# Patient Record
Sex: Male | Born: 1940 | Race: White | Hispanic: No | Marital: Married | Smoking: Former smoker
Health system: Southern US, Community
[De-identification: ages and names within clinical notes are randomized; demographics above are authoritative.]

## PROBLEM LIST (undated history)

## (undated) DIAGNOSIS — I639 Cerebral infarction, unspecified: Secondary | ICD-10-CM

## (undated) DIAGNOSIS — J4489 Other specified chronic obstructive pulmonary disease: Secondary | ICD-10-CM

## (undated) DIAGNOSIS — I4891 Unspecified atrial fibrillation: Secondary | ICD-10-CM

## (undated) DIAGNOSIS — I499 Cardiac arrhythmia, unspecified: Secondary | ICD-10-CM

## (undated) DIAGNOSIS — Z9889 Other specified postprocedural states: Secondary | ICD-10-CM

## (undated) DIAGNOSIS — Z974 Presence of external hearing-aid: Secondary | ICD-10-CM

## (undated) DIAGNOSIS — Z8673 Personal history of transient ischemic attack (TIA), and cerebral infarction without residual deficits: Secondary | ICD-10-CM

## (undated) DIAGNOSIS — Z8701 Personal history of pneumonia (recurrent): Secondary | ICD-10-CM

## (undated) DIAGNOSIS — T8859XA Other complications of anesthesia, initial encounter: Secondary | ICD-10-CM

## (undated) DIAGNOSIS — E042 Nontoxic multinodular goiter: Secondary | ICD-10-CM

## (undated) DIAGNOSIS — E785 Hyperlipidemia, unspecified: Secondary | ICD-10-CM

## (undated) DIAGNOSIS — J439 Emphysema, unspecified: Secondary | ICD-10-CM

## (undated) DIAGNOSIS — I712 Thoracic aortic aneurysm, without rupture, unspecified: Secondary | ICD-10-CM

## (undated) DIAGNOSIS — N281 Cyst of kidney, acquired: Secondary | ICD-10-CM

## (undated) DIAGNOSIS — I10 Essential (primary) hypertension: Secondary | ICD-10-CM

## (undated) DIAGNOSIS — M1990 Unspecified osteoarthritis, unspecified site: Secondary | ICD-10-CM

## (undated) DIAGNOSIS — R911 Solitary pulmonary nodule: Secondary | ICD-10-CM

## (undated) DIAGNOSIS — J9611 Chronic respiratory failure with hypoxia: Secondary | ICD-10-CM

## (undated) DIAGNOSIS — R350 Frequency of micturition: Secondary | ICD-10-CM

## (undated) DIAGNOSIS — T4145XA Adverse effect of unspecified anesthetic, initial encounter: Secondary | ICD-10-CM

## (undated) DIAGNOSIS — Z860100 Personal history of colon polyps, unspecified: Secondary | ICD-10-CM

## (undated) DIAGNOSIS — Z7901 Long term (current) use of anticoagulants: Secondary | ICD-10-CM

## (undated) DIAGNOSIS — J189 Pneumonia, unspecified organism: Secondary | ICD-10-CM

## (undated) DIAGNOSIS — F329 Major depressive disorder, single episode, unspecified: Secondary | ICD-10-CM

## (undated) DIAGNOSIS — N200 Calculus of kidney: Secondary | ICD-10-CM

## (undated) DIAGNOSIS — C678 Malignant neoplasm of overlapping sites of bladder: Secondary | ICD-10-CM

## (undated) DIAGNOSIS — F0390 Unspecified dementia without behavioral disturbance: Secondary | ICD-10-CM

## (undated) DIAGNOSIS — N401 Enlarged prostate with lower urinary tract symptoms: Secondary | ICD-10-CM

## (undated) DIAGNOSIS — R0600 Dyspnea, unspecified: Secondary | ICD-10-CM

## (undated) DIAGNOSIS — Z9981 Dependence on supplemental oxygen: Secondary | ICD-10-CM

## (undated) DIAGNOSIS — F32A Depression, unspecified: Secondary | ICD-10-CM

## (undated) DIAGNOSIS — C679 Malignant neoplasm of bladder, unspecified: Secondary | ICD-10-CM

## (undated) DIAGNOSIS — Z87442 Personal history of urinary calculi: Secondary | ICD-10-CM

## (undated) DIAGNOSIS — I480 Paroxysmal atrial fibrillation: Secondary | ICD-10-CM

## (undated) DIAGNOSIS — G43109 Migraine with aura, not intractable, without status migrainosus: Secondary | ICD-10-CM

## (undated) HISTORY — PX: TONSILLECTOMY: SUR1361

## (undated) HISTORY — PX: INGUINAL HERNIA REPAIR: SUR1180

## (undated) HISTORY — PX: SHOULDER ARTHROSCOPY: SHX128

## (undated) HISTORY — DX: Thoracic aortic aneurysm, without rupture, unspecified: I71.20

## (undated) HISTORY — DX: Thoracic aortic aneurysm, without rupture: I71.2

## (undated) HISTORY — PX: OTHER SURGICAL HISTORY: SHX169

## (undated) HISTORY — PX: POSTERIOR LUMBAR FUSION: SHX6036

---
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
## 2001-05-16 ENCOUNTER — Ambulatory Visit (HOSPITAL_COMMUNITY): Admission: RE | Admit: 2001-05-16 | Discharge: 2001-05-16 | Payer: Self-pay | Admitting: *Deleted

## 2001-05-16 ENCOUNTER — Encounter: Payer: Self-pay | Admitting: Internal Medicine

## 2001-08-15 ENCOUNTER — Encounter: Payer: Self-pay | Admitting: Family Medicine

## 2001-08-15 ENCOUNTER — Ambulatory Visit (HOSPITAL_COMMUNITY): Admission: RE | Admit: 2001-08-15 | Discharge: 2001-08-15 | Payer: Self-pay | Admitting: Family Medicine

## 2002-09-23 ENCOUNTER — Encounter: Payer: Self-pay | Admitting: Family Medicine

## 2002-09-23 ENCOUNTER — Ambulatory Visit (HOSPITAL_COMMUNITY): Admission: RE | Admit: 2002-09-23 | Discharge: 2002-09-23 | Payer: Self-pay | Admitting: Family Medicine

## 2002-10-23 ENCOUNTER — Ambulatory Visit (HOSPITAL_COMMUNITY): Admission: RE | Admit: 2002-10-23 | Discharge: 2002-10-23 | Payer: Self-pay | Admitting: General Surgery

## 2003-11-17 ENCOUNTER — Ambulatory Visit (HOSPITAL_COMMUNITY): Admission: RE | Admit: 2003-11-17 | Discharge: 2003-11-17 | Payer: Self-pay | Admitting: Family Medicine

## 2004-11-28 ENCOUNTER — Ambulatory Visit (HOSPITAL_COMMUNITY): Admission: RE | Admit: 2004-11-28 | Discharge: 2004-11-28 | Payer: Self-pay | Admitting: Family Medicine

## 2005-03-06 ENCOUNTER — Encounter (HOSPITAL_COMMUNITY): Admission: RE | Admit: 2005-03-06 | Discharge: 2005-04-05 | Payer: Self-pay | Admitting: Neurosurgery

## 2005-04-06 ENCOUNTER — Encounter (HOSPITAL_COMMUNITY): Admission: RE | Admit: 2005-04-06 | Discharge: 2005-05-06 | Payer: Self-pay | Admitting: Neurosurgery

## 2005-11-29 ENCOUNTER — Ambulatory Visit (HOSPITAL_COMMUNITY): Admission: RE | Admit: 2005-11-29 | Discharge: 2005-11-29 | Payer: Self-pay | Admitting: Family Medicine

## 2006-01-16 ENCOUNTER — Ambulatory Visit (HOSPITAL_COMMUNITY): Admission: RE | Admit: 2006-01-16 | Discharge: 2006-01-16 | Payer: Self-pay | Admitting: General Surgery

## 2006-01-16 ENCOUNTER — Encounter (INDEPENDENT_AMBULATORY_CARE_PROVIDER_SITE_OTHER): Payer: Self-pay | Admitting: General Surgery

## 2007-02-19 ENCOUNTER — Ambulatory Visit (HOSPITAL_COMMUNITY): Admission: RE | Admit: 2007-02-19 | Discharge: 2007-02-19 | Payer: Self-pay | Admitting: Family Medicine

## 2007-02-28 ENCOUNTER — Ambulatory Visit (HOSPITAL_COMMUNITY): Admission: RE | Admit: 2007-02-28 | Discharge: 2007-02-28 | Payer: Self-pay | Admitting: Family Medicine

## 2009-07-14 ENCOUNTER — Ambulatory Visit (HOSPITAL_BASED_OUTPATIENT_CLINIC_OR_DEPARTMENT_OTHER): Admission: RE | Admit: 2009-07-14 | Discharge: 2009-07-14 | Payer: Self-pay | Admitting: Orthopedic Surgery

## 2009-07-14 HISTORY — PX: OTHER SURGICAL HISTORY: SHX169

## 2010-07-18 ENCOUNTER — Ambulatory Visit (HOSPITAL_COMMUNITY): Admission: RE | Admit: 2010-07-18 | Discharge: 2010-07-18 | Payer: Self-pay | Admitting: Internal Medicine

## 2011-04-02 LAB — BASIC METABOLIC PANEL
BUN: 18 mg/dL (ref 6–23)
CO2: 31 mEq/L (ref 19–32)
Calcium: 8.8 mg/dL (ref 8.4–10.5)
Chloride: 106 mEq/L (ref 96–112)
Creatinine, Ser: 1.08 mg/dL (ref 0.4–1.5)
GFR calc Af Amer: >60 mL/min (ref >60)
GFR calc non Af Amer: >60 mL/min (ref >60)
Glucose, Bld: 94 mg/dL (ref 70–99)
Potassium: 3.9 mEq/L (ref 3.5–5.1)
Sodium: 141 mEq/L (ref 135–145)

## 2011-04-02 LAB — POCT HEMOGLOBIN-HEMACUE: Hemoglobin: 15.6 g/dL (ref 13.0–17.0)

## 2011-05-09 NOTE — Op Note (Signed)
NAMECAMAR, GUYTON               ACCOUNT NO.:  [id number]   MEDICAL RECORD NO.:  [id number]          PATIENT TYPE:  AMB   LOCATION:  DSC                          FACILITY:  [facility]   PHYSICIAN:  Cindee Salt, M.D.       DATE OF BIRTH:  11-14-1941   DATE OF PROCEDURE:  07/14/2009  DATE OF DISCHARGE:                               OPERATIVE REPORT   PREOPERATIVE DIAGNOSIS:  Cubital tunnel syndrome, left arm.   POSTOPERATIVE DIAGNOSIS:  Cubital tunnel syndrome, left arm.   OPERATION:  Decompression of ulnar nerve, left cubital tunnel.   SURGEON:  Cindee Salt, MD   ASSISTANT:  Carolyne Fiscal, RN   ANESTHESIA:  General.   ANESTHESIOLOGIST:  Janetta Hora. Frederick, MD   HISTORY:  The patient is a 70 year old male with a history of numbness  and tingling, ring and little fingers of his right hand.  Nerve  conductions are positive for cubital tunnel with changes in the motor  component.  He is advised to undergo surgical decompression.  Pre, peri,  postoperative course is discussed along with the risks and  complications.  He is aware that there is no guarantee with the surgery,  possibility of infection, recurrence of injury to arteries, nerves,  tendons, incomplete relief of symptoms, dystrophy.  In the preoperative  area, the patient is seen, extremity marked by both the patient and  surgeon, antibiotic is given.   PROCEDURE:  The patient was brought to the operating room where a  general anesthetic was carried out without difficulty under the  direction of Dr. Gelene Mink.  He was prepped using ChloraPrep, supine  position, left arm free.  A 3-minute dry time was allowed.  Time-out  taken confirming the patient and procedure.  He was then draped.  The  limb was exsanguinated with an Esmarch bandage, tourniquet placed high,  and the arm was inflated to 250 mmHg.  An incision was made over the  medial epicondyle, carried down through subcutaneous tissue.  The  posterior branches of medial  antebrachial cutaneous nerve of the arm  were identified.  These were protected.  Retractors were placed.  The  Osborne fascia was opened.  The ulnar nerve was identified.  Retractors  were placed proximally with loupe magnification with headlight  illumination.  A release was then performed.  Bleeders were  electrocauterized.  Release was performed up to the arcade of Struthers.  This was released.  No bleeding was noted.  The retractors were then  placed distally after dissection of the forearm fascia, the flexor carpi  ulnaris.  The fascia was released.  The muscle was then splint.  The  deep fascia was then released over the nerve after retractors were  placed under loupe magnification.  This was for approximately 7 cm in  each direction.  The arm was placed through full flexion.  No  subluxation of the nerve was noted.  The wound was copiously irrigated  with saline.  The skin was then closed with a subcuticular 5-0 Vicryl  suture.  A sterile compressive dressing and long-arm splint applied.  On  deflation of the tourniquet, all fingers immediately pinked.  An  infiltration of the area was done with 0.25% Marcaine without  epinephrine prior to closure.  The patient tolerated the procedure well  and was taken to the recovery room for observation in satisfactory  condition.  He will be discharged home to return to the [facility] in 1 week on Vicodin.           ______________________________  Cindee Salt, M.D.     GK/MEDQ  D:  07/14/2009  T:  07/15/2009  Job:  [id number]   cc:   Corrie Mckusick, M.D.

## 2011-05-12 NOTE — H&P (Signed)
NAMEABDUR, Kyle Owen               ACCOUNT NO.:  [id number]   MEDICAL RECORD NO.:  [id number]          PATIENT TYPE:  AMB   LOCATION:                                FACILITY:  [facility]   PHYSICIAN:  Dalia Heading, M.D.  DATE OF BIRTH:  1941-05-23   DATE OF ADMISSION:  01/16/2006  DATE OF DISCHARGE:  LH                                HISTORY & PHYSICAL   CHIEF COMPLAINT:  History of sigmoid colon polyp.   HISTORY OF PRESENT ILLNESS:  The patient is a 70 year old, white male who  presents for a followup colonoscopy.  He had sigmoid polypectomy during a  colonoscopy in 2003.  He now presents for a followup colonoscopy.  No cancer  was seen at the original polypectomy.  The patient denies any weight loss,  fever, abdominal pain, constipation, diarrhea, melena, hematochezia.  He  denies any family history of colon carcinoma.   PAST MEDICAL HISTORY:  Arthritis.   PAST SURGICAL HISTORY:  1.  Shoulder surgery.  2.  Cataract surgery.  3.  As in history of present illness.   CURRENT MEDICATIONS:  Ibuprofen p.r.n. arthritis pain.   ALLERGIES:  No known drug allergies.   REVIEW OF SYSTEMS:  The patient smokes a pack of cigarettes per day.  Denies  any alcohol use.  He denies any other cardiopulmonary difficulties or  bleeding disorders.   PHYSICAL EXAMINATION:  GENERAL:  The patient is a well-developed, well-  nourished, white male in no acute distress.  LUNGS:  Clear to auscultation with equal breath sounds bilaterally.  HEART:  Regular rate and rhythm without S3, S4 or murmurs.  ABDOMEN:  Soft, nontender, nondistended.  No hepatosplenomegaly or masses  are noted.  RECTAL:  Deferred to the procedure.   IMPRESSION:  History of sigmoid colon polyp.   PLAN:  The patient is scheduled for a colonoscopy on January 16, 2006.  The  risks and benefits of the procedure including bleeding and perforation were  fully explained to the patient gaining informed consent.      Dalia Heading,  M.D.  Electronically Signed     MAJ/MEDQ  D:  01/09/2006  T:  01/09/2006  Job:  [id number]   cc:   Corrie Mckusick, M.D.  Fax: [phone]

## 2011-05-12 NOTE — H&P (Signed)
   Kyle Owen, SIEVER                           ACCOUNT NO.:  [id number]   MEDICAL RECORD NO.:  [id number]                  PATIENT TYPE:   LOCATION:                                       FACILITY:   PHYSICIAN:  Dalia Heading, M.D.               DATE OF BIRTH:  [birth year]   DATE OF ADMISSION:  DATE OF DISCHARGE:                                HISTORY & PHYSICAL   CHIEF COMPLAINT:  Hematochezia.   HISTORY OF PRESENT ILLNESS:  The patient is a 70 year old white male who is  referred for a colonoscopy.  He has had intermittent episodes of  hematochezia for many years, possibly secondary to hemorrhoidal disease.  Denies any abdominal complaints.  He has never had a colonoscopy.  There is  no immediate family history of colon carcinoma.   PAST MEDICAL HISTORY:  Unremarkable.   PAST SURGICAL HISTORY:  Shoulder surgery, herniorrhaphy in the past.   CURRENT MEDICATIONS:  None.   ALLERGIES:  No known drug allergies.   REVIEW OF SYSTEMS:  The patient does smoke a pack of cigarettes a day.  He  denies any alcohol use.  He denies any other cardiopulmonary difficulties or  bleeding disorders.   PHYSICAL EXAMINATION:  GENERAL:  The patient is a well-developed, well-  nourished white male in no acute distress.  VITAL SIGNS:  He is afebrile and vital signs are stable.  LUNGS:  Clear to auscultation with equal breath sounds bilaterally.  HEART:  Regular rate and rhythm without S3, S4, or murmurs.  ABDOMEN:  Soft, nontender, nondistended.  No hepatosplenomegaly or masses  are noted.  RECTAL:  Deferred to the procedure.   IMPRESSION:  Hematochezia.    PLAN:  The patient is scheduled for a colonoscopy on October 21, 2002.  The  risks and benefits of the procedure including bleeding and perforation were  fully explained to the patient, gave informed consent.                                                 Dalia Heading, M.D.    MAJ/MEDQ  D:  10/02/2002  T:  10/02/2002  Job:   [id number]   cc:   Corrie Mckusick, M.D.  [address] [place] [place]  [place]  [place] [phone]  Fax: [phone]

## 2011-08-02 ENCOUNTER — Ambulatory Visit (HOSPITAL_COMMUNITY)
Admission: RE | Admit: 2011-08-02 | Discharge: 2011-08-02 | Disposition: A | Discharge status: Home / Self Care | Payer: Medicare Other | Source: Ambulatory Visit | Attending: Internal Medicine | Admitting: Internal Medicine

## 2011-08-02 ENCOUNTER — Other Ambulatory Visit (HOSPITAL_COMMUNITY): Payer: Self-pay | Admitting: Internal Medicine

## 2011-08-02 DIAGNOSIS — M25559 Pain in unspecified hip: Secondary | ICD-10-CM

## 2011-09-05 NOTE — H&P (Signed)
  NTS SOAP Note  Vital Signs:  Vitals as of: 09/05/2011: Systolic 144: Diastolic 80: Heart Rate 76: Temp 98.84F: Height 55ft 0in: Weight 191Lbs 0 Ounces: Pain Level 0: BMI 26  BMI : 25.9 kg/m2  Subjective: This 70 Years 49 Months old Male presents for of colonoscopy. Last TCS in 2007 showed benign sigmoid colon polyp. No GI complaints.  Review of Symptoms:  Constitutional: unremarkable  Head: unremarkable  Eyes: blurred vision left,diplopia Nose/Mouth/Throat:unremarkable  Cardiovascular:unremarkable  Respiratory: cough Gastrointestinal:unremarkable  Genitourinary: unremarkable  joint pain Skin: unremarkable  Hematolgic/Lymphatic: unremarkable  Allergic/Immunologic:unremarkable     Past Medical History:Reviewed  Past Medical History  Surgical History: shoulder, hernia, TCS Medical Problems: High Blood pressure Psychiatric History: Depression Allergies: nkda Medications: meloxicam, verapamil, zoloft   Social History: Reviewed   Social History  Preferred Language: English ([place]) Race: White Ethnicity: Not Hispanic / Latino Age: 70 Years 5 Months Marital Status: M Alcohol: No Recreational drug(s): No   Smoking Status: Current every day smoker reviewed on 09/05/2011 Started Date: 12/25/1958 Packs per day: 1.00   Family History: Reviewed  Family History  Sibling: Stroke   Objective Information:  General: Well appearing, well nourished in no distress.  Head: Atraumatic; no masses; no abnormalities  Neck: Supple without lymphadenopathy.  Heart: RRR, no murmur or gallop. Normal S1, S2. No S3, S4.  Lungs:CTA bilaterally, no wheezes, rhonchi, rales. Breathing unlabored.  Abdomen: Soft, NT/ND, no HSM, no masses.  deferred to procedure  Assessment: h/o colon polyp  Diagnosis & Procedure: DiagnosisCode: V12.72, ProcedureCode: 40981,   Orders:trilyte prescribed     Plan:Scheduled for TCS on 10/03/11.    Patient Education: Alternative  treatments to surgery were discussed with patient (and family).Risks and benefits of procedure were fully explained to the patient (and family) who gave informed consent. Patient/family questions were addressed.  Follow-up: Pending Surgery

## 2011-10-02 MED ORDER — SODIUM CHLORIDE 0.45 % IV SOLN
Freq: Once | INTRAVENOUS | Status: AC
  Administered 2011-10-03: 20 mL/h via INTRAVENOUS

## 2011-10-03 ENCOUNTER — Ambulatory Visit (HOSPITAL_COMMUNITY)
Admission: RE | Admit: 2011-10-03 | Discharge: 2011-10-03 | Disposition: A | Discharge status: Home / Self Care | Payer: Medicare Other | Source: Ambulatory Visit | Attending: General Surgery | Admitting: General Surgery

## 2011-10-03 ENCOUNTER — Encounter (HOSPITAL_COMMUNITY): Payer: Self-pay | Admitting: *Deleted

## 2011-10-03 ENCOUNTER — Encounter (HOSPITAL_COMMUNITY)
Admission: RE | Disposition: A | Discharge status: Home / Self Care | Payer: Self-pay | Source: Ambulatory Visit | Attending: General Surgery

## 2011-10-03 DIAGNOSIS — K648 Other hemorrhoids: Secondary | ICD-10-CM | POA: Insufficient documentation

## 2011-10-03 DIAGNOSIS — Z860100 Personal history of colon polyps, unspecified: Secondary | ICD-10-CM | POA: Insufficient documentation

## 2011-10-03 DIAGNOSIS — D126 Benign neoplasm of colon, unspecified: Secondary | ICD-10-CM | POA: Insufficient documentation

## 2011-10-03 DIAGNOSIS — Z79899 Other long term (current) drug therapy: Secondary | ICD-10-CM | POA: Insufficient documentation

## 2011-10-03 DIAGNOSIS — Z09 Encounter for follow-up examination after completed treatment for conditions other than malignant neoplasm: Secondary | ICD-10-CM | POA: Insufficient documentation

## 2011-10-03 DIAGNOSIS — I10 Essential (primary) hypertension: Secondary | ICD-10-CM | POA: Insufficient documentation

## 2011-10-03 DIAGNOSIS — K5730 Diverticulosis of large intestine without perforation or abscess without bleeding: Secondary | ICD-10-CM | POA: Insufficient documentation

## 2011-10-03 HISTORY — PX: COLONOSCOPY: SHX5424

## 2011-10-03 HISTORY — DX: Depression, unspecified: F32.A

## 2011-10-03 HISTORY — DX: Unspecified osteoarthritis, unspecified site: M19.90

## 2011-10-03 HISTORY — DX: Major depressive disorder, single episode, unspecified: F32.9

## 2011-10-03 SURGERY — COLONOSCOPY
Anesthesia: Moderate Sedation

## 2011-10-03 MED ORDER — STERILE WATER FOR IRRIGATION IR SOLN
Status: DC | PRN
  Administered 2011-10-03: 09:00:00

## 2011-10-03 MED ORDER — MEPERIDINE HCL 50 MG/ML IJ SOLN
INTRAMUSCULAR | Status: AC
  Filled 2011-10-03: qty 1

## 2011-10-03 MED ORDER — MEPERIDINE HCL 25 MG/ML IJ SOLN
INTRAMUSCULAR | Status: DC | PRN
  Administered 2011-10-03: 50 mg via INTRAVENOUS

## 2011-10-03 MED ORDER — MIDAZOLAM HCL 5 MG/5ML IJ SOLN
INTRAMUSCULAR | Status: AC
  Filled 2011-10-03: qty 5

## 2011-10-03 MED ORDER — MIDAZOLAM HCL 5 MG/5ML IJ SOLN
INTRAMUSCULAR | Status: DC | PRN
  Administered 2011-10-03: 1 mg via INTRAVENOUS
  Administered 2011-10-03: 3 mg via INTRAVENOUS

## 2011-10-03 NOTE — Interval H&P Note (Signed)
History and Physical Interval Note:   10/03/2011   8:45 AM   Camille Bal  has presented today for surgery, with the diagnosis of Colon polyps [211.3]  The various methods of treatment have been discussed with the patient and family. After consideration of risks, benefits and other options for treatment, the patient has consented to  Procedure(s): COLONOSCOPY as a surgical intervention .  I have reviewed the patients' chart and labs.  Questions were answered to the patient's satisfaction.     Dalia Heading  MD

## 2011-10-03 NOTE — OR Nursing (Signed)
Sigmoid polyp snared; not retrieved; Dr Lovell Sheehan aware.

## 2011-10-11 ENCOUNTER — Encounter (HOSPITAL_COMMUNITY): Payer: Self-pay | Admitting: General Surgery

## 2012-03-05 DIAGNOSIS — N400 Enlarged prostate without lower urinary tract symptoms: Secondary | ICD-10-CM | POA: Diagnosis not present

## 2012-03-05 DIAGNOSIS — F329 Major depressive disorder, single episode, unspecified: Secondary | ICD-10-CM | POA: Diagnosis not present

## 2012-04-29 DIAGNOSIS — F329 Major depressive disorder, single episode, unspecified: Secondary | ICD-10-CM | POA: Diagnosis not present

## 2012-04-29 DIAGNOSIS — M25559 Pain in unspecified hip: Secondary | ICD-10-CM | POA: Diagnosis not present

## 2012-04-30 DIAGNOSIS — M169 Osteoarthritis of hip, unspecified: Secondary | ICD-10-CM | POA: Diagnosis not present

## 2012-04-30 DIAGNOSIS — G5700 Lesion of sciatic nerve, unspecified lower limb: Secondary | ICD-10-CM | POA: Diagnosis not present

## 2012-05-08 ENCOUNTER — Ambulatory Visit (HOSPITAL_COMMUNITY)
Admission: RE | Admit: 2012-05-08 | Discharge: 2012-05-08 | Disposition: A | Discharge status: Home / Self Care | Payer: Medicare Other | Source: Ambulatory Visit | Attending: Orthopedic Surgery | Admitting: Orthopedic Surgery

## 2012-05-08 DIAGNOSIS — IMO0001 Reserved for inherently not codable concepts without codable children: Secondary | ICD-10-CM | POA: Insufficient documentation

## 2012-05-08 DIAGNOSIS — M6281 Muscle weakness (generalized): Secondary | ICD-10-CM | POA: Insufficient documentation

## 2012-05-08 DIAGNOSIS — M25559 Pain in unspecified hip: Secondary | ICD-10-CM | POA: Diagnosis not present

## 2012-05-08 DIAGNOSIS — M545 Low back pain, unspecified: Secondary | ICD-10-CM | POA: Insufficient documentation

## 2012-05-08 DIAGNOSIS — M25569 Pain in unspecified knee: Secondary | ICD-10-CM | POA: Insufficient documentation

## 2012-05-08 NOTE — Evaluation (Addendum)
Physical Therapy Evaluation  Patient Details  Name: Kyle Owen MRN: [id number] Date of Birth: 1941-10-25  Today's Date: 05/08/2012 Time: 1515-1604 PT Time Calculation (min): 49 min  Visit#: 1  of 6   Re-eval: 05/29/12 Assessment Diagnosis: piriformis  Next MD Visit: 06/23/2012 Prior Therapy: none  Authorization: medicare  Past Medical History:  Past Medical History  Diagnosis Date  . Hearing impairment   . Migraines     takes verapamil   . Colon polyps   . Arthritis   . Depression    Past Surgical History:  Past Surgical History  Procedure Date  . Hernia repair   . Shoulder arthroscopy     left  . Cataract extraction     Right  . Elbow arthroscopy     left  . Colonoscopy 10/03/2011    Procedure: COLONOSCOPY;  Surgeon: Dalia Heading;  [facility]: [facility];  Service: Gastroenterology;  Laterality: N/A;    Subjective Symptoms/Limitations Symptoms: Mr. Apostol states that he began having hip pain in his left leg for over four months.  He states he occasionally has pain going into his buttock.  He states that when he is twisting his leg as if getting out of the car or off a tractor  the pain is so severe that he almost falls.  This severe pain is only for a short time.  He states that the postion of comfort is to cross his left leg over his right.   He also recieves relief from puttting his arms on a fence post and allow his body to drop.lHe states that he has constant low back pain.  He takes pain meds in the morning but even with that by the afternoon he is bending forward.  He states his pain was so great that he was ready for surgery.   How long can you sit comfortably?: The patient states he is able to sit but he will cross his left leg over his right How long can you stand comfortably?: He is able to stand for two hours and then he needs to lie down How long can you walk comfortably?: The patient is limping all the time.  He is on so much pain medication that he is  able to keep walking. Special Tests: riding on lawn mower is very painful.  He needs to lie down following this activity. Pain Assessment Currently in Pain?: Yes Pain Score:   3 Pain Location: Back Pain Orientation: Left Pain Type: Chronic pain Pain Onset: More than a month ago Pain Frequency: Constant Pain Relieving Factors: traction; Multiple Pain Sites: Yes L hip current 4 worst 9/10 Prior Functioning  Prior Function Vocation: Retired Leisure: Hobbies-yes (Comment) Comments: gun smith, farming  Cognition/Observation Cognition Overall Cognitive Status: Appears within functional limits for tasks assessed   Assessment RLE Strength Right Hip Flexion: 5/5 Right Hip Extension: 3/5 Right Hip ABduction: 5/5 Right Hip ADduction: 5/5 Right Knee Flexion: 4/5 Right Knee Extension: 5/5 Right Ankle Dorsiflexion: 5/5 LLE Strength Left Hip Flexion: 5/5 Left Hip Extension: 3/5 Left Hip ABduction: 5/5 Left Hip ADduction: 5/5 Left Knee Flexion: 4/5 Left Knee Extension: 5/5 Lumbar AROM Lumbar Flexion: decreased 30% reps  Lumbar Extension: decreased 80% with reps no change Lumbar - Right Rotation: decreased 20% Lumbar - Left Rotation: decreased 20%  Exercise/Treatments Mobility/Balance  Posture/Postural Control Posture/Postural Control: Postural limitations Postural Limitations: forward head, rounded shoulder, flat back   Stretches Active Hamstring Stretch: 3 reps;30 seconds Knee: Self-Stretch to increase Flexion: 3  reps;30 seconds;Limitations Knee: Self-Stretch Limitations: knee to chest. Piriformis Stretch: 1 rep;30 seconds;Limitations Piriformis Stretch Limitations: All four   Modalities Modalities: Ultrasound Ultrasound Ultrasound Location: L gluteus medius  Ultrasound Parameters: 1.4 w/cm2 Ultrasound Goals: Pain  Physical Therapy Assessment and Plan PT Assessment and Plan Clinical Impression Statement: Pt with poor sitting posture, postural changes and chronic  low back pain who is not complaining of L hip pain who will benefit from skilled PT to decrease symptoms and improve the patients quality of life. Rehab Potential: Good PT Frequency: Min 2X/week PT Treatment/Interventions: Therapeutic activities;Therapeutic exercise;Other (comment) (modailities as needed to decrease pain ) PT Plan: Begin supine and sidelying clam ex next treatment. review stretches.  If pain is not significantly better next week may begin pelvic traction.    Goals Home Exercise Program Pt will Perform Home Exercise Program: Independently PT Short Term Goals Time to Complete Short Term Goals: 3 weeks PT Short Term Goal 1: Pain level in hip to be decreased by 4 PT Short Term Goal 2: Pt to state his is able to get in and out of his car and tractor without any increased pain.  Problem List There is no problem list on file for this patient.   PT - End of Session Activity Tolerance: Patient tolerated treatment well General Behavior During Session: [facility] for tasks performed Cognition: [facility] for tasks performed PT Plan of Care Consulted and Agree with Plan of Care: Patient  GP  Functional Reporting Modifier  Current Status  [phone] - Changing & Maintaing Body Position CL - At least 60% but less than 80% impaired, limited or restricted  Goal Status  [phone] - Changing & Maintaing Body Position CI - At least 1% but less than 20% impaired, limited or restricted  I chose changing position because  most pain is when he is getting out of a car or off his tractor.  I chose CL due to the fact the pt states he is ready to go to surgery to get rid of the pain. Minyon Billiter,CINDY 05/08/2012, 4:20 PM  Physician Documentation Your signature is required to indicate approval of the treatment plan as stated above.  Please sign and either send electronically or make a copy of this report for your files and return this physician signed original.   Please mark one 1.__approve of plan  2. ___approve of  plan with the following conditions.   ______________________________                                                          _____________________ Physician Signature                                                                                                             Date

## 2012-05-13 ENCOUNTER — Ambulatory Visit (HOSPITAL_COMMUNITY)
Admission: RE | Admit: 2012-05-13 | Discharge: 2012-05-13 | Disposition: A | Discharge status: Home / Self Care | Payer: Medicare Other | Source: Ambulatory Visit | Attending: Internal Medicine | Admitting: Internal Medicine

## 2012-05-13 NOTE — Progress Notes (Signed)
Physical Therapy Treatment Patient Details  Name: Kyle Owen MRN: [id number] Date of Birth: 06-Feb-1941  Today's Date: 05/13/2012 Time: 7829-5621 PT Time Calculation (min): 42 min Visit#: 2  of 6   Re-eval: 05/29/12 Charges: Therex x 30' Ultrasound x 8'   Subjective: Symptoms/Limitations Symptoms: After the ultrasound last session I realized I wasn't limiping anymore. My th time I got home I was limping again. Pain Assessment Currently in Pain?: Yes Pain Score:   3 Pain Location: Back (L hip) Pain Orientation: Left   Exercise/Treatments Stretches Active Hamstring Stretch: 3 reps;30 seconds Single Knee to Chest Stretch: 3 reps;30 seconds Supine Clam: 10 reps Sidelying Clam: 5 reps 10" holds  Quadruped Piriformis stretch 1 x30" B  Modalities Modalities: Ultrasound Ultrasound Ultrasound Location: L gluteus medius Ultrasound Parameters: 1.2 w/cm2 Ultrasound Goals: Pain  Physical Therapy Assessment and Plan PT Assessment and Plan Clinical Impression Statement: Reviewed stretches and completed clams in SL and supine. Pt completes all exercises well but does require multimodal cueing to complete piriformis stretch correctly. Ultrasound completed again this session secondary to positive results after last session. Pt presents with decreased antalgia at end of session. Pt reports pain decrease to 2/10 at end of session. PT Plan: Continue to progress per PT POC.     Problem List There is no problem list on file for this patient.   PT - End of Session Activity Tolerance: Patient tolerated treatment well General Behavior During Session: [facility] for tasks performed Cognition: [facility] for tasks performed   Seth Bake, PTA 05/13/2012, 1:58 PM

## 2012-05-14 ENCOUNTER — Ambulatory Visit (HOSPITAL_COMMUNITY): Payer: Medicare Other | Admitting: Physical Therapy

## 2012-05-16 ENCOUNTER — Ambulatory Visit (HOSPITAL_COMMUNITY)
Admission: RE | Admit: 2012-05-16 | Discharge: 2012-05-16 | Disposition: A | Discharge status: Home / Self Care | Payer: Medicare Other | Source: Ambulatory Visit | Attending: Internal Medicine | Admitting: Internal Medicine

## 2012-05-16 NOTE — Progress Notes (Signed)
Physical Therapy Treatment Patient Details  Name: Kyle Owen MRN: [id number] Date of Birth: 01-Feb-1941  Today's Date: 05/16/2012 Time: 1346-1430 PT Time Calculation (min): 44 min Visit#: 3  of 6   Re-eval: 05/29/12 Charges: Therex x 30' [place] x 8'  Subjective: Symptoms/Limitations Symptoms: Pt states he is still having trouble with quadruped piriformis stretch. Pain Assessment Currently in Pain?: Yes Pain Location: Back Pain Orientation: Left   Exercise/Treatments Stretches Passive Hamstring Stretch: 3 reps;30 seconds;Limitations Passive Hamstring Stretch Limitations: with rope Single Knee to Chest Stretch: 3 reps;30 seconds Piriformis Stretch: 3 reps;30 seconds;Limitations Piriformis Stretch Limitations: supine figure four Sidelying Clam: 5 reps;Limitations Clam Limitations: 10" holds Hip Abduction: 10 reps  Modalities Modalities: Ultrasound Ultrasound Ultrasound Location: L gluteus medius Ultrasound Parameters: 1 MHz 1.5 w/cm2 Ultrasound Goals: Pain  Physical Therapy Assessment and Plan PT Assessment and Plan Clinical Impression Statement: Pt ambulates into therapy with decrease antalgia. Modified piriformis stretch to supine figure four stretch as pt is unable to correctly complete stretch at home after instruction last two visits. Ultrasound continued this session to decrease pain. Pt reports pain decrease to 2/10 at end of session. PT Plan: Continue to progress per PT POC.     Problem List There is no problem list on file for this patient.   PT - End of Session Activity Tolerance: Patient tolerated treatment well General Behavior During Session: [facility] for tasks performed Cognition: [facility] for tasks performed    Seth Bake, PTA 05/16/2012, 5:43 PM

## 2012-05-21 ENCOUNTER — Ambulatory Visit (HOSPITAL_COMMUNITY)
Admission: RE | Admit: 2012-05-21 | Discharge: 2012-05-21 | Disposition: A | Discharge status: Home / Self Care | Payer: Medicare Other | Source: Ambulatory Visit | Attending: Internal Medicine | Admitting: Internal Medicine

## 2012-05-21 NOTE — Progress Notes (Signed)
Physical Therapy Treatment Patient Details  Name: Kyle Owen MRN: [id number] Date of Birth: December 01, 1941  Today's Date: 05/21/2012 Time: 1350 (started by PT (LM))-1432 Visit#: 5  of 6   Re-eval: 05/29/12 Authorization: medicare  Charges:  therex 10', therex 20', ultrasound 8' in sidelying  Subjective: Symptoms/Limitations Symptoms: Pt. states he always has pain.  2-3/10 usually, a little uncomfortable. Pain Assessment Currently in Pain?: Yes Pain Score:   3 Pain Location: Back Pain Orientation: Left Pain Type: Chronic pain   Exercise/Treatments Stretches Active Hamstring Stretch: 3 reps;30 seconds;Limitations Active Hamstring Stretch Limitations: with rope Single Knee to Chest Stretch: 3 reps;30 seconds Piriformis Stretch: 3 reps;30 seconds;Limitations Piriformis Stretch Limitations: supine figure four Sidelying Clam: 5 reps;Limitations Clam Limitations: 10" holds Hip Abduction: 10 reps Prone  Other Prone Lumbar Exercises: quad stretch 3X30"    Other Manual Therapy: Grade II PA mobs to L SI joint with STM to L piriformis to decrease pain in prone position performed by Annett Fabian, PT. Ultrasound Location: L gluteus medius Ultrasound Parameters: 1 MHz 1.5 w/cm2 Ultrasound Goals: Pain  Physical Therapy Assessment and Plan PT Assessment and Plan Clinical Impression Statement: joint mobs and STM performed by Annett Fabian, PT today to L SI while in prone position with good results.  Continued [place] treatment with Pt. reporting painfree at end of session.  Also added prone quad stretch to POC. PT Plan: Continue current POC; re-eval next visit/medicare G codes if applicable.    Lurena Nida, PTA/CLT 05/21/2012, 2:44 PM

## 2012-05-23 ENCOUNTER — Ambulatory Visit (HOSPITAL_COMMUNITY)
Admission: RE | Admit: 2012-05-23 | Discharge: 2012-05-23 | Disposition: A | Discharge status: Home / Self Care | Payer: Medicare Other | Source: Ambulatory Visit | Attending: Internal Medicine | Admitting: Internal Medicine

## 2012-05-23 NOTE — Progress Notes (Signed)
Physical Therapy Treatment Patient Details  Name: Kyle Owen MRN: [id number] Date of Birth: 1941-06-26  Today's Date: 05/23/2012 Time: 1306-1420 PT Time Calculation (min): 74 min  Visit#: 5  of 6   Re-eval: 05/29/12 Charges: Therex x 40' [place] x 8' Manual x 10'   Subjective: Symptoms/Limitations Symptoms: Pt states that his hip is not bothering him today but he is having some LBP. Pain Assessment Currently in Pain?: Yes Pain Score:   3 Pain Location: Back Pain Orientation: Lower   Exercise/Treatment Stretches Active Hamstring Stretch: 3 reps;30 seconds;Limitations Active Hamstring Stretch Limitations: with rope Single Knee to Chest Stretch: 3 reps;30 seconds Lower Trunk Rotation: 5 reps;10 seconds Piriformis Stretch: 3 reps;30 seconds;Limitations Piriformis Stretch Limitations: supine figure four with towel Supine Other Supine Lumbar Exercises: butterfly stretch x 1' Sidelying Clam: 10 reps Clam Limitations: 10" holds  Manual Therapy Other Manual Therapy: MET completed to correct L in-flare (TM x 5' after MET) Ultrasound Ultrasound Location: L gluteus medius Ultrasound Parameters: 1 MHz 1.5 w/cm2 Ultrasound Goals: Pain  Physical Therapy Assessment and Plan PT Assessment and Plan Clinical Impression Statement: Pt compeltes therex well. Pt complains of pain in L groins. Began butterfly stretch to improve adductor flexibility. Also began lower trunk rotation stretch to improve lumbar mobility and decrease pain. MET completed to correct L in-flare. Pt reports 0/10 at end of session. PT Plan: Reassess next session.     Problem List There is no problem list on file for this patient.   PT - End of Session Activity Tolerance: Patient tolerated treatment well General Behavior During Session: [facility] for tasks performed Cognition: [facility] for tasks performed  Seth Bake, PTA 05/23/2012, 2:31 PM

## 2012-05-27 ENCOUNTER — Ambulatory Visit (HOSPITAL_COMMUNITY)
Admission: RE | Admit: 2012-05-27 | Discharge: 2012-05-27 | Disposition: A | Discharge status: Home / Self Care | Payer: Medicare Other | Source: Ambulatory Visit | Attending: Internal Medicine | Admitting: Internal Medicine

## 2012-05-27 DIAGNOSIS — M25559 Pain in unspecified hip: Secondary | ICD-10-CM | POA: Insufficient documentation

## 2012-05-27 DIAGNOSIS — M6281 Muscle weakness (generalized): Secondary | ICD-10-CM | POA: Diagnosis not present

## 2012-05-27 DIAGNOSIS — M25569 Pain in unspecified knee: Secondary | ICD-10-CM | POA: Diagnosis not present

## 2012-05-27 DIAGNOSIS — M545 Low back pain, unspecified: Secondary | ICD-10-CM | POA: Diagnosis not present

## 2012-05-27 DIAGNOSIS — IMO0001 Reserved for inherently not codable concepts without codable children: Secondary | ICD-10-CM | POA: Insufficient documentation

## 2012-05-27 NOTE — Evaluation (Signed)
Physical Therapy Re-Evaluation/Treatment  Patient Details  Name: Kyle Owen MRN: [id number] Date of Birth: [birth year]  Today's Date: 05/27/2012 Time: 1302-1345 PT Time Calculation (min): 43 min Charges: 1 ROM, 15' TE, 20' Manual Visit#: 6  of 9   Re-eval: 06/19/12    Authorization: MEDICARE  Authorization Time Period: Positioning: Current: CJ; Goal: CI  Authorization Visit#: 6  of 16    Past Medical History:  Past Medical History  Diagnosis Date  . Hearing impairment   . Migraines     takes verapamil   . Colon polyps   . Arthritis   . Depression    Past Surgical History:  Past Surgical History  Procedure Date  . Hernia repair   . Shoulder arthroscopy     left  . Cataract extraction     Right  . Elbow arthroscopy     left  . Colonoscopy 10/03/2011    Procedure: COLONOSCOPY;  Surgeon: Dalia Heading;  Location: [facility];  Service: Gastroenterology;  Laterality: N/A;    Subjective Symptoms/Limitations Symptoms: Pt reports that he didn't have any pain from Thursday until Saturday morning.  he reports that he is going to be going to a shooting tournament for 5 days.  he states that he shot Saturday and it is the best he has felt in a few years.   Pain Assessment Currently in Pain?: Yes Pain Score:   2 (w/pain medication) Pain Location: Back (hip is more resoloved)  Assessment RLE Strength Right Hip Extension: 3+/5 (was 3/5) Right Knee Flexion: 5/5 (was 4/5) LLE Strength Left Hip Extension: 3+/5 (was 3/5) Left Knee Flexion: 5/5 (was 4/5) Lumbar AROM Lumbar Flexion: WNL (was decreased 30% reps ) Lumbar Extension: decreased 80% with reps pain rate 4/10 befor and after manual treatment (was decreased 80% with reps no change) Lumbar - Right Rotation: WNL (was decreased 20%) Lumbar - Left Rotation: WNL  (was decreased 20%) Palpation Palpation: increased TP to L groin region.  Decreased mobility L5-S1 SP and L3-4 L TP w/muscles spasms and fascial restrcitons on  the L lumbar paraspinar region.   Mobility/Balance  Ambulation/Gait Ambulation/Gait: Yes Gait Pattern: Decreased stance time - left;Decreased trunk rotation Stairs: Yes Posture/Postural Control Posture/Postural Control: Postural limitations Postural Limitations: improved posture   Exercise/Treatments Stretches Active Hamstring Stretch: 1 rep;30 seconds;Other (comment) (BLE) Single Knee to Chest Stretch: 1 rep;60 seconds;Other (comment) (BLE) Piriformis Stretch: 1 rep;60 seconds;Other (comment) (BLE) Piriformis Stretch Limitations: supine figure four with towel  Seated Other Seated Lumbar Exercises: Heel and Toe Roll in and outs 5x10' Supine Other Supine Lumbar Exercises: butterfly stretch x 1' Standing: Heel Walking 2 RT   Manual Therapy Manual Therapy: Joint mobilization Joint Mobilization: Grade I-IV to L5-S1 SP and L3-4 L TP w/stm after to decrease pain Other Manual Therapy: MET to correct L in-flare, w/L long leg distraction; SCS to L adductor to decrease pain.  Physical Therapy Assessment and Plan PT Assessment and Plan Clinical Impression Statement: G-code updated, re-eval complete. Mr. Frappier has attended 6 OP PT visits and has met his current goals.  He has expirenced a significant decrease in overall pain to his hip pain, however continues to have pin point pain over L L3-L4 TP.  Added exercises to address internal and external hip rotator muscle length. Pt reports decreased low back pain at end of session, except remains 4/10 w/lumbar extension Pt will benefit from skilled therapeutic intervention in order to improve on the following deficits: Pain;Decreased mobility;Increased muscle spasms;Increased fascial restricitons;Other (  comment) Rehab Potential: Good PT Duration:  (3 visits) PT Treatment/Interventions: Gait training;Functional mobility training;Stair training;Therapeutic exercise;Balance training;Neuromuscular re-education;Therapeutic activities;Patient/family  education;Other (comment) (manual techniques and modalities for pain control) PT Plan: Address pain to low back and hip after trap shooting tournament. Continue with manual techniques, stretching activities and gait training.     Goals Home Exercise Program Pt will Perform Home Exercise Program: Independently PT Goal: Perform Home Exercise Program - Progress: Met (at least 1x/day) PT Short Term Goals Time to Complete Short Term Goals: 3 weeks PT Short Term Goal 1: Pain level in hip to be decreased by 4 PT Short Term Goal 1 - Progress: Met PT Short Term Goal 2: Pt to state his is able to get in and out of his car and tractor without any increased pain. PT Short Term Goal 2 - Progress: Partly met ("it is getting better, decreased intensity." ) PT Short Term Goal 3: NEW GOAL 05/27/12: Pt will be able to tolerate 5 days of trap shooting with pain less than 3/10 in order to return to activities.  Problem List There is no problem list on file for this patient.   PT - End of Session Activity Tolerance: Patient tolerated treatment well General Behavior During Session: [facility] for tasks performed Cognition: [facility] for tasks performed PT Plan of Care PT Home Exercise Plan: see scanned report Consulted and Agree with Plan of Care: Patient  GP  Functional Reporting Modifier  Current Status  [phone] - Changing & Maintaing Body Position CJ - At least 20% but less than 40% impaired, limited or restricted  Goal Status  [phone] - Changing & Maintaing Body Position CI - At least 1% but less than 20% impaired, limited or restricted   Antrone Walla 05/27/2012, 2:06 PM  Physician Documentation Your signature is required to indicate approval of the treatment plan as stated above.  Please sign and either send electronically or make a copy of this report for your files and return this physician signed original.   Please mark one 1.__approve of plan  2. ___approve of plan with the following  conditions.   ______________________________                                                          _____________________ Physician Signature                                                                                                             Date

## 2012-05-30 ENCOUNTER — Ambulatory Visit (HOSPITAL_COMMUNITY): Payer: Medicare Other | Admitting: Physical Therapy

## 2012-06-03 ENCOUNTER — Ambulatory Visit (HOSPITAL_COMMUNITY)
Admission: RE | Admit: 2012-06-03 | Discharge: 2012-06-03 | Disposition: A | Discharge status: Home / Self Care | Payer: Medicare Other | Source: Ambulatory Visit | Attending: Internal Medicine | Admitting: Internal Medicine

## 2012-06-03 DIAGNOSIS — IMO0001 Reserved for inherently not codable concepts without codable children: Secondary | ICD-10-CM | POA: Diagnosis not present

## 2012-06-03 DIAGNOSIS — M25559 Pain in unspecified hip: Secondary | ICD-10-CM | POA: Diagnosis not present

## 2012-06-03 DIAGNOSIS — M25569 Pain in unspecified knee: Secondary | ICD-10-CM | POA: Diagnosis not present

## 2012-06-03 DIAGNOSIS — M545 Low back pain, unspecified: Secondary | ICD-10-CM | POA: Diagnosis not present

## 2012-06-03 DIAGNOSIS — M6281 Muscle weakness (generalized): Secondary | ICD-10-CM | POA: Diagnosis not present

## 2012-06-03 NOTE — Progress Notes (Signed)
Physical Therapy Treatment Patient Details  Name: Kyle Owen MRN: [id number] Date of Birth: Jan 18, 1941  Today's Date: 06/03/2012 Time: 0935-1015 PT Time Calculation (min): 40 min  Visit#: 7  of 9   Re-eval: 06/21/12  Charge: therex 23 Manual 16  Authorization: MEDICARE  Authorization Time Period: Positioning: Current: CJ; Goal: CI  Authorization Visit#: 7  of 16    Subjective: Symptoms/Limitations Symptoms: Kyle Owen three trophies at the shooting tournament this weekend, pain increases after sitting for 5-10 minutes but I stop limping after walking for 40 feet.  Pain scale 1 1/2 L LBP. Pain Assessment Currently in Pain?: Yes Pain Score:   2 Pain Location: Back Pain Orientation: Left;Lower  Objective:  Exercise/Treatments Stretches Active Hamstring Stretch: 2 reps;30 seconds;Limitations Active Hamstring Stretch Limitations: with rope Single Knee to Chest Stretch: 1 rep;60 seconds;Other (comment) (BLE) Piriformis Stretch: 1 rep;60 seconds;Other (comment) Piriformis Stretch Limitations: supine figure four with towel Supine Other Supine Lumbar Exercises: butterfly stretch x 1'   Manual Therapy Manual Therapy: Massage Massage: STM to L psoas and L piriformis Other Manual Therapy: SI within alignment today, no MET required  Physical Therapy Assessment and Plan PT Assessment and Plan Clinical Impression Statement: SI within alignment this session, no MET required. Pt with increased tenderness L adductor, completed STM to L adductor and psoas with relief stated pain scale 1/10. Therex session focus on stretches and manual for pain relief. Pt with improved gait mechanics noted at end of session. PT Plan: Address pain to low back and hip after trap shooting tournament.  Continue with manual techniques, stretching activities and gait training.    Goals    Problem List There is no problem list on file for this patient.   PT - End of Session Activity Tolerance: Patient  tolerated treatment well General Behavior During Session: [facility] for tasks performed Cognition: [facility] for tasks performed  GP No functional reporting required  Juel Burrow 06/03/2012, 5:04 PM

## 2012-06-05 ENCOUNTER — Ambulatory Visit (HOSPITAL_COMMUNITY)
Admission: RE | Admit: 2012-06-05 | Discharge: 2012-06-05 | Disposition: A | Discharge status: Home / Self Care | Payer: Medicare Other | Source: Ambulatory Visit | Attending: Internal Medicine | Admitting: Internal Medicine

## 2012-06-05 DIAGNOSIS — M25559 Pain in unspecified hip: Secondary | ICD-10-CM | POA: Diagnosis not present

## 2012-06-05 DIAGNOSIS — IMO0001 Reserved for inherently not codable concepts without codable children: Secondary | ICD-10-CM | POA: Diagnosis not present

## 2012-06-05 DIAGNOSIS — M6281 Muscle weakness (generalized): Secondary | ICD-10-CM | POA: Diagnosis not present

## 2012-06-05 DIAGNOSIS — M25569 Pain in unspecified knee: Secondary | ICD-10-CM | POA: Diagnosis not present

## 2012-06-05 DIAGNOSIS — M545 Low back pain, unspecified: Secondary | ICD-10-CM | POA: Diagnosis not present

## 2012-06-05 NOTE — Progress Notes (Addendum)
Physical Therapy Treatment Patient Details  Name: Kyle Owen MRN: [id number] Date of Birth: 1941-10-09  Today's Date: 06/05/2012 Time: 2536-6440 PT Time Calculation (min): 22 min  Visit#: 8  of 9   Re-eval: 06/21/12 Charges: Self care x 15'  Authorization: MEDICARE  Authorization Time Period: Positioning: Current: CJ; Goal: CI  Authorization Visit#: 8  of 16    Subjective: Symptoms/Limitations Symptoms: Pt states his pain is decreasing. Pain Assessment Currently in Pain?: Yes Pain Score:   2 Pain Location: Back Pain Orientation: Left   Exercise/Treatments Seated hip roll in/roll out 10x5"  Physical Therapy Assessment and Plan PT Assessment and Plan Clinical Impression Statement: SI in alignment this session. Pt reports very minimal pain this session. Pt states that his pain did not increase above 3/10 when he went trap shooting this weekend. Pt understands HEP and has reports no difficulty with the exercises. All goals have been met. PT Plan: Recommend to d/c per PT POC.    Goals Home Exercise Program Pt will Perform Home Exercise Program: Independently PT Short Term Goals Time to Complete Short Term Goals: 3 weeks PT Short Term Goal 1: Pain level in hip to be decreased by 4 PT Short Term Goal 1 - Progress: Met PT Short Term Goal 2: Pt to state his is able to get in and out of his car and tractor without any increased pain. PT Short Term Goal 2 - Progress: Met PT Short Term Goal 3: NEW GOAL 05/27/12: Pt will be able to tolerate 5 days of trap shooting with pain less than 3/10 in order to return to activities. PT Short Term Goal 3 - Progress: Met  Problem List There is no problem list on file for this patient.   PT - End of Session Activity Tolerance: Patient tolerated treatment well General Behavior During Session: [facility] for tasks performed Cognition: [facility] for tasks performed    Seth Bake, PTA 06/05/2012, 5:03 PM

## 2012-06-07 ENCOUNTER — Ambulatory Visit (HOSPITAL_COMMUNITY): Payer: Medicare Other | Admitting: *Deleted

## 2012-06-11 DIAGNOSIS — M169 Osteoarthritis of hip, unspecified: Secondary | ICD-10-CM | POA: Diagnosis not present

## 2012-06-11 DIAGNOSIS — G5700 Lesion of sciatic nerve, unspecified lower limb: Secondary | ICD-10-CM | POA: Diagnosis not present

## 2012-07-29 DIAGNOSIS — M1990 Unspecified osteoarthritis, unspecified site: Secondary | ICD-10-CM | POA: Diagnosis not present

## 2012-07-29 DIAGNOSIS — Z125 Encounter for screening for malignant neoplasm of prostate: Secondary | ICD-10-CM | POA: Diagnosis not present

## 2012-07-29 DIAGNOSIS — Z79899 Other long term (current) drug therapy: Secondary | ICD-10-CM | POA: Diagnosis not present

## 2012-07-29 DIAGNOSIS — G43009 Migraine without aura, not intractable, without status migrainosus: Secondary | ICD-10-CM | POA: Diagnosis not present

## 2012-08-02 DIAGNOSIS — N400 Enlarged prostate without lower urinary tract symptoms: Secondary | ICD-10-CM | POA: Insufficient documentation

## 2012-08-02 DIAGNOSIS — G43109 Migraine with aura, not intractable, without status migrainosus: Secondary | ICD-10-CM | POA: Insufficient documentation

## 2012-08-02 DIAGNOSIS — M1990 Unspecified osteoarthritis, unspecified site: Secondary | ICD-10-CM | POA: Insufficient documentation

## 2012-08-02 DIAGNOSIS — F32A Depression, unspecified: Secondary | ICD-10-CM | POA: Insufficient documentation

## 2012-08-02 DIAGNOSIS — F17200 Nicotine dependence, unspecified, uncomplicated: Secondary | ICD-10-CM | POA: Insufficient documentation

## 2012-08-02 DIAGNOSIS — K635 Polyp of colon: Secondary | ICD-10-CM | POA: Insufficient documentation

## 2012-08-02 DIAGNOSIS — F419 Anxiety disorder, unspecified: Secondary | ICD-10-CM | POA: Insufficient documentation

## 2012-08-02 DIAGNOSIS — D696 Thrombocytopenia, unspecified: Secondary | ICD-10-CM | POA: Insufficient documentation

## 2012-08-02 DIAGNOSIS — M1610 Unilateral primary osteoarthritis, unspecified hip: Secondary | ICD-10-CM | POA: Insufficient documentation

## 2012-08-05 DIAGNOSIS — D696 Thrombocytopenia, unspecified: Secondary | ICD-10-CM | POA: Diagnosis not present

## 2012-08-05 DIAGNOSIS — G43819 Other migraine, intractable, without status migrainosus: Secondary | ICD-10-CM | POA: Diagnosis not present

## 2012-08-05 DIAGNOSIS — M5020 Other cervical disc displacement, unspecified cervical region: Secondary | ICD-10-CM | POA: Diagnosis not present

## 2012-08-05 DIAGNOSIS — Z1212 Encounter for screening for malignant neoplasm of rectum: Secondary | ICD-10-CM | POA: Diagnosis not present

## 2012-08-07 DIAGNOSIS — Z961 Presence of intraocular lens: Secondary | ICD-10-CM | POA: Diagnosis not present

## 2012-08-07 DIAGNOSIS — H5210 Myopia, unspecified eye: Secondary | ICD-10-CM | POA: Diagnosis not present

## 2012-08-07 DIAGNOSIS — Z9849 Cataract extraction status, unspecified eye: Secondary | ICD-10-CM | POA: Diagnosis not present

## 2012-08-07 DIAGNOSIS — H2589 Other age-related cataract: Secondary | ICD-10-CM | POA: Diagnosis not present

## 2012-08-22 ENCOUNTER — Ambulatory Visit (INDEPENDENT_AMBULATORY_CARE_PROVIDER_SITE_OTHER): Payer: Medicare Other | Admitting: Otolaryngology

## 2012-08-22 DIAGNOSIS — J342 Deviated nasal septum: Secondary | ICD-10-CM

## 2012-08-22 DIAGNOSIS — J383 Other diseases of vocal cords: Secondary | ICD-10-CM

## 2012-08-22 DIAGNOSIS — K219 Gastro-esophageal reflux disease without esophagitis: Secondary | ICD-10-CM | POA: Diagnosis not present

## 2012-08-22 DIAGNOSIS — R490 Dysphonia: Secondary | ICD-10-CM

## 2012-09-24 ENCOUNTER — Other Ambulatory Visit (HOSPITAL_COMMUNITY): Payer: Self-pay | Admitting: Internal Medicine

## 2012-09-24 ENCOUNTER — Ambulatory Visit (HOSPITAL_COMMUNITY)
Admission: RE | Admit: 2012-09-24 | Discharge: 2012-09-24 | Disposition: A | Discharge status: Home / Self Care | Payer: Medicare Other | Source: Ambulatory Visit | Attending: Internal Medicine | Admitting: Internal Medicine

## 2012-09-24 DIAGNOSIS — R059 Cough, unspecified: Secondary | ICD-10-CM | POA: Insufficient documentation

## 2012-09-24 DIAGNOSIS — R05 Cough: Secondary | ICD-10-CM

## 2012-09-24 DIAGNOSIS — R918 Other nonspecific abnormal finding of lung field: Secondary | ICD-10-CM | POA: Diagnosis not present

## 2012-09-24 DIAGNOSIS — J209 Acute bronchitis, unspecified: Secondary | ICD-10-CM | POA: Diagnosis not present

## 2012-09-26 ENCOUNTER — Ambulatory Visit (INDEPENDENT_AMBULATORY_CARE_PROVIDER_SITE_OTHER): Payer: Medicare Other | Admitting: Otolaryngology

## 2012-09-26 DIAGNOSIS — K219 Gastro-esophageal reflux disease without esophagitis: Secondary | ICD-10-CM | POA: Diagnosis not present

## 2012-09-26 DIAGNOSIS — R490 Dysphonia: Secondary | ICD-10-CM

## 2012-09-26 DIAGNOSIS — J342 Deviated nasal septum: Secondary | ICD-10-CM | POA: Diagnosis not present

## 2012-09-30 DIAGNOSIS — H547 Unspecified visual loss: Secondary | ICD-10-CM | POA: Diagnosis not present

## 2012-09-30 DIAGNOSIS — H35379 Puckering of macula, unspecified eye: Secondary | ICD-10-CM | POA: Diagnosis not present

## 2012-09-30 DIAGNOSIS — Z961 Presence of intraocular lens: Secondary | ICD-10-CM | POA: Diagnosis not present

## 2012-09-30 DIAGNOSIS — H2589 Other age-related cataract: Secondary | ICD-10-CM | POA: Diagnosis not present

## 2012-10-03 ENCOUNTER — Encounter (HOSPITAL_COMMUNITY): Payer: Self-pay | Admitting: Pharmacy Technician

## 2012-10-11 NOTE — Patient Instructions (Addendum)
Your procedure is scheduled on: 10/21/2012  Report to [facility] at   615       AM.  Call this number if you have problems the morning of surgery: [phone]   Do not eat food or drink liquids :After Midnight.      Take these medicines the morning of surgery with A SIP OF WATER:lodine,prilosec,deltazone,zoloft,calan   Do not wear jewelry, make-up or nail polish.  Do not wear lotions, powders, or perfumes. You may wear deodorant.  Do not shave 48 hours prior to surgery.  Do not bring valuables to the hospital.  Contacts, dentures or bridgework may not be worn into surgery.  Leave suitcase in the car. After surgery it may be brought to your room.  For patients admitted to the hospital, checkout time is 11:00 AM the day of discharge.   Patients discharged the day of surgery will not be allowed to drive home.  :     Please read over the following fact sheets that you were given: Coughing and Deep Breathing, Surgical Site Infection Prevention, Anesthesia Post-op Instructions and Care and Recovery After Surgery    Cataract A cataract is a clouding of the lens of the eye. When a lens becomes cloudy, vision is reduced based on the degree and nature of the clouding. Many cataracts reduce vision to some degree. Some cataracts make people more near-sighted as they develop. Other cataracts increase glare. Cataracts that are ignored and become worse can sometimes look white. The white color can be seen through the pupil. CAUSES   Aging. However, cataracts may occur at any age, even in newborns.   Certain drugs.   Trauma to the eye.   Certain diseases such as diabetes.   Specific eye diseases such as chronic inflammation inside the eye or a sudden attack of a rare form of glaucoma.   Inherited or acquired medical problems.  SYMPTOMS   Gradual, progressive drop in vision in the affected eye.   Severe, rapid visual loss. This most often happens when trauma is the cause.  DIAGNOSIS  To  detect a cataract, an eye doctor examines the lens. Cataracts are best diagnosed with an exam of the eyes with the pupils enlarged (dilated) by drops.  TREATMENT  For an early cataract, vision may improve by using different eyeglasses or stronger lighting. If that does not help your vision, surgery is the only effective treatment. A cataract needs to be surgically removed when vision loss interferes with your everyday activities, such as driving, reading, or watching TV. A cataract may also have to be removed if it prevents examination or treatment of another eye problem. Surgery removes the cloudy lens and usually replaces it with a substitute lens (intraocular lens, IOL).  At a time when both you and your doctor agree, the cataract will be surgically removed. If you have cataracts in both eyes, only one is usually removed at a time. This allows the operated eye to heal and be out of danger from any possible problems after surgery (such as infection or poor wound healing). In rare cases, a cataract may be doing damage to your eye. In these cases, your caregiver may advise surgical removal right away. The vast majority of people who have cataract surgery have better vision afterward. HOME CARE INSTRUCTIONS  If you are not planning surgery, you may be asked to do the following:  Use different eyeglasses.   Use stronger or brighter lighting.   Ask your eye doctor about  reducing your medicine dose or changing medicines if it is thought that a medicine caused your cataract. Changing medicines does not make the cataract go away on its own.   Become familiar with your surroundings. Poor vision can lead to injury. Avoid bumping into things on the affected side. You are at a higher risk for tripping or falling.   Exercise extreme care when driving or operating machinery.   Wear sunglasses if you are sensitive to bright light or experiencing problems with glare.  SEEK IMMEDIATE MEDICAL CARE IF:   You have  a worsening or sudden vision loss.   You notice redness, swelling, or increasing pain in the eye.   You have a fever.  Document Released: 12/11/2005 Document Revised: 11/30/2011 Document Reviewed: 08/04/2011 [facility] Patient Information 2012 [place], [place].PATIENT INSTRUCTIONS POST-ANESTHESIA  IMMEDIATELY FOLLOWING SURGERY:  Do not drive or operate machinery for the first twenty four hours after surgery.  Do not make any important decisions for twenty four hours after surgery or while taking narcotic pain medications or sedatives.  If you develop intractable nausea and vomiting or a severe headache please notify your doctor immediately.  FOLLOW-UP:  Please make an appointment with your surgeon as instructed. You do not need to follow up with anesthesia unless specifically instructed to do so.  WOUND CARE INSTRUCTIONS (if applicable):  Keep a dry clean dressing on the anesthesia/puncture wound site if there is drainage.  Once the wound has quit draining you may leave it open to air.  Generally you should leave the bandage intact for twenty four hours unless there is drainage.  If the epidural site drains for more than 36-48 hours please call the anesthesia department.  QUESTIONS?:  Please feel free to call your physician or the hospital operator if you have any questions, and they will be happy to assist you.

## 2012-10-14 ENCOUNTER — Ambulatory Visit (HOSPITAL_COMMUNITY)
Admission: RE | Admit: 2012-10-14 | Discharge: 2012-10-14 | Disposition: A | Discharge status: Home / Self Care | Payer: Medicare Other | Source: Ambulatory Visit | Attending: Internal Medicine | Admitting: Internal Medicine

## 2012-10-14 ENCOUNTER — Encounter (HOSPITAL_COMMUNITY)
Admission: RE | Admit: 2012-10-14 | Discharge: 2012-10-14 | Disposition: A | Discharge status: Home / Self Care | Payer: Medicare Other | Source: Ambulatory Visit | Attending: Ophthalmology | Admitting: Ophthalmology

## 2012-10-14 DIAGNOSIS — H2510 Age-related nuclear cataract, unspecified eye: Secondary | ICD-10-CM | POA: Diagnosis not present

## 2012-10-14 DIAGNOSIS — Z01810 Encounter for preprocedural cardiovascular examination: Secondary | ICD-10-CM | POA: Diagnosis not present

## 2012-10-14 DIAGNOSIS — Z01812 Encounter for preprocedural laboratory examination: Secondary | ICD-10-CM | POA: Diagnosis not present

## 2012-10-14 DIAGNOSIS — Z5309 Procedure and treatment not carried out because of other contraindication: Secondary | ICD-10-CM | POA: Insufficient documentation

## 2012-10-14 DIAGNOSIS — J449 Chronic obstructive pulmonary disease, unspecified: Secondary | ICD-10-CM | POA: Diagnosis not present

## 2012-10-14 DIAGNOSIS — J189 Pneumonia, unspecified organism: Secondary | ICD-10-CM | POA: Diagnosis not present

## 2012-10-14 NOTE — Pre-Procedure Instructions (Signed)
Patient called to let him know that surgery is still on for 10/21/2012. He will be here 10/17/2012 at 1100 for PAT. Or scheduler,Dr Hunt's office and Dr Alonza Smoker office notified.

## 2012-10-14 NOTE — Pre-Procedure Instructions (Signed)
Patient in for PAT. o2 sat 81. Sat probe moved from finger to finger,patient instructed to take deep breaths and sat finally up to 88% but no more. Patient told states  he had just finished prednisone and antibiotics for pneumonia per Dr Ouida Sills but had not had a followup chest xray. Dr Ouida Sills notified and chest xray ordered stat. Will defer preop visit until chest xray results are back. Will call patient once results are back to complete this PAT.

## 2012-10-14 NOTE — Pre-Procedure Instructions (Signed)
Dr Jayme Cloud aware of repeat chest xray report and says ok with him to procede with surery.

## 2012-10-14 NOTE — Pre-Procedure Instructions (Signed)
Chest xray report called to Dr Alonza Smoker office and suggests followup again-infiltrate not completely resolved. Dr Ouida Sills now out of office until Wednesday 10/16/2012. Jewel, Dr Alonza Smoker office nurse will leave this on his desk to evaluate and let us know about surgery procedeing or being canceled.

## 2012-10-17 ENCOUNTER — Encounter (HOSPITAL_COMMUNITY)
Admission: RE | Admit: 2012-10-17 | Discharge: 2012-10-17 | Disposition: A | Discharge status: Home / Self Care | Payer: Medicare Other | Source: Ambulatory Visit | Attending: Ophthalmology | Admitting: Ophthalmology

## 2012-10-17 ENCOUNTER — Encounter (HOSPITAL_COMMUNITY): Payer: Self-pay

## 2012-10-17 DIAGNOSIS — Z01810 Encounter for preprocedural cardiovascular examination: Secondary | ICD-10-CM | POA: Diagnosis not present

## 2012-10-17 DIAGNOSIS — Z5309 Procedure and treatment not carried out because of other contraindication: Secondary | ICD-10-CM | POA: Diagnosis not present

## 2012-10-17 DIAGNOSIS — H2510 Age-related nuclear cataract, unspecified eye: Secondary | ICD-10-CM | POA: Diagnosis not present

## 2012-10-17 DIAGNOSIS — Z01812 Encounter for preprocedural laboratory examination: Secondary | ICD-10-CM | POA: Diagnosis not present

## 2012-10-17 LAB — BASIC METABOLIC PANEL
BUN: 22 mg/dL (ref 6–23)
CO2: 31 mEq/L (ref 19–32)
Calcium: 9 mg/dL (ref 8.4–10.5)
Chloride: 101 mEq/L (ref 96–112)
Creatinine, Ser: 1.2 mg/dL (ref 0.50–1.35)
GFR calc Af Amer: 68 mL/min — ABNORMAL LOW (ref >90)
GFR calc non Af Amer: 59 mL/min — ABNORMAL LOW (ref >90)
Glucose, Bld: 91 mg/dL (ref 70–99)
Potassium: 4.4 mEq/L (ref 3.5–5.1)
Sodium: 140 mEq/L (ref 135–145)

## 2012-10-17 LAB — HEMOGLOBIN AND HEMATOCRIT, BLOOD
HCT: 37.6 % — ABNORMAL LOW (ref 39.0–52.0)
Hemoglobin: 12 g/dL — ABNORMAL LOW (ref 13.0–17.0)

## 2012-10-18 MED ORDER — LIDOCAINE HCL 3.5 % OP GEL
OPHTHALMIC | Status: AC
  Filled 2012-10-18: qty 5

## 2012-10-18 MED ORDER — TETRACAINE HCL 0.5 % OP SOLN
OPHTHALMIC | Status: AC
  Filled 2012-10-18: qty 2

## 2012-10-18 MED ORDER — LIDOCAINE HCL (PF) 1 % IJ SOLN
INTRAMUSCULAR | Status: AC
  Filled 2012-10-18: qty 2

## 2012-10-18 MED ORDER — PHENYLEPHRINE HCL 2.5 % OP SOLN
OPHTHALMIC | Status: AC
  Filled 2012-10-18: qty 2

## 2012-10-18 MED ORDER — CYCLOPENTOLATE-PHENYLEPHRINE 0.2-1 % OP SOLN
OPHTHALMIC | Status: AC
  Filled 2012-10-18: qty 2

## 2012-10-18 MED ORDER — NEOMYCIN-POLYMYXIN-DEXAMETH 3.5-10000-0.1 OP OINT
TOPICAL_OINTMENT | OPHTHALMIC | Status: AC
  Filled 2012-10-18: qty 3.5

## 2012-10-21 ENCOUNTER — Ambulatory Visit (HOSPITAL_COMMUNITY): Admission: RE | Admit: 2012-10-21 | Payer: Medicare Other | Source: Ambulatory Visit | Admitting: Ophthalmology

## 2012-10-21 SURGERY — PHACOEMULSIFICATION, CATARACT, WITH IOL INSERTION
Anesthesia: Monitor Anesthesia Care | Site: Eye | Laterality: Left

## 2012-10-30 ENCOUNTER — Other Ambulatory Visit (HOSPITAL_COMMUNITY): Payer: Self-pay | Admitting: Internal Medicine

## 2012-10-30 ENCOUNTER — Ambulatory Visit (HOSPITAL_COMMUNITY)
Admission: RE | Admit: 2012-10-30 | Discharge: 2012-10-30 | Disposition: A | Discharge status: Home / Self Care | Payer: Medicare Other | Source: Ambulatory Visit | Attending: Internal Medicine | Admitting: Internal Medicine

## 2012-10-30 DIAGNOSIS — J189 Pneumonia, unspecified organism: Secondary | ICD-10-CM | POA: Diagnosis not present

## 2012-11-20 DIAGNOSIS — J189 Pneumonia, unspecified organism: Secondary | ICD-10-CM | POA: Diagnosis not present

## 2012-11-20 DIAGNOSIS — Z23 Encounter for immunization: Secondary | ICD-10-CM | POA: Diagnosis not present

## 2012-12-09 ENCOUNTER — Encounter (HOSPITAL_COMMUNITY): Payer: Self-pay | Admitting: Pharmacy Technician

## 2012-12-09 DIAGNOSIS — H2589 Other age-related cataract: Secondary | ICD-10-CM | POA: Diagnosis not present

## 2012-12-09 DIAGNOSIS — Z961 Presence of intraocular lens: Secondary | ICD-10-CM | POA: Diagnosis not present

## 2012-12-09 DIAGNOSIS — H52229 Regular astigmatism, unspecified eye: Secondary | ICD-10-CM | POA: Diagnosis not present

## 2012-12-09 DIAGNOSIS — H5200 Hypermetropia, unspecified eye: Secondary | ICD-10-CM | POA: Diagnosis not present

## 2012-12-10 NOTE — Patient Instructions (Signed)
Your procedure is scheduled on:  12/16/12  Report to [facility] at 07:00 AM.  Call this number if you have problems the morning of surgery: [phone]   Remember:   Do not eat or drink:After Midnight.  Take these medicines the morning of surgery with A SIP OF WATER: Omeprazole and Verapamil. Use your Ventolin Inhaler also.   Do not wear jewelry, make-up or nail polish.  Do not wear lotions, powders, or perfumes. You may wear deodorant.  Do not shave 48 hours prior to surgery. Men may shave face and neck.  Do not bring valuables to the hospital.  Contacts, dentures or bridgework may not be worn into surgery.  Leave suitcase in the car. After surgery it may be brought to your room.  For patients admitted to the hospital, checkout time is 11:00 AM the day of discharge.   Patients discharged the day of surgery will not be allowed to drive home.    Special Instructions: Start using your eye drops before surgery as directed by your eye doctor.   Please read over the following fact sheets that you were given: Anesthesia Post-op Instructions    Cataract Surgery  A cataract is a clouding of the lens of the eye. When a lens becomes cloudy, vision is reduced based on the degree and nature of the clouding. Surgery may be needed to improve vision. Surgery removes the cloudy lens and usually replaces it with a substitute lens (intraocular lens, IOL). LET YOUR EYE DOCTOR KNOW ABOUT:  Allergies to food or medicine.  Medicines taken including herbs, eyedrops, over-the-counter medicines, and creams.  Use of steroids (by mouth or creams).  Previous problems with anesthetics or numbing medicine.  History of bleeding problems or blood clots.  Previous surgery.  Other health problems, including diabetes and kidney problems.  Possibility of pregnancy, if this applies. RISKS AND COMPLICATIONS  Infection.  Inflammation of the eyeball (endophthalmitis) that can spread to both eyes (sympathetic  ophthalmia).  Poor wound healing.  If an IOL is inserted, it can later fall out of proper position. This is very uncommon.  Clouding of the part of your eye that holds an IOL in place. This is called an "after-cataract." These are uncommon, but easily treated. BEFORE THE PROCEDURE  Do not eat or drink anything except small amounts of water for 8 to 12 before your surgery, or as directed by your caregiver.  Unless you are told otherwise, continue any eyedrops you have been prescribed.  Talk to your primary caregiver about all other medicines that you take (both prescription and non-prescription). In some cases, you may need to stop or change medicines near the time of your surgery. This is most important if you are taking blood-thinning medicine.Do not stop medicines unless you are told to do so.  Arrange for someone to drive you to and from the procedure.  Do not put contact lenses in either eye on the day of your surgery. PROCEDURE There is more than one method for safely removing a cataract. Your doctor can explain the differences and help determine which is best for you. Phacoemulsification surgery is the most common form of cataract surgery.  An injection is given behind the eye or eyedrops are given to make this a painless procedure.  A small cut (incision) is made on the edge of the clear, dome-shaped surface that covers the front of the eye (cornea).  A tiny probe is painlessly inserted into the eye. This device gives off ultrasound waves  that soften and break up the cloudy center of the lens. This makes it easier for the cloudy lens to be removed by suction.  An IOL may be implanted.  The normal lens of the eye is covered by a clear capsule. Part of that capsule is intentionally left in the eye to support the IOL.  Your surgeon may or may not use stitches to close the incision. There are other forms of cataract surgery that require a larger incision and stiches to close the  eye. This approach is taken in cases where the doctor feels that the cataract cannot be easily removed using phacoemulsification. AFTER THE PROCEDURE  When an IOL is implanted, it does not need care. It becomes a permanent part of your eye and cannot be seen or felt.  Your doctor will schedule follow-up exams to check on your progress.  Review your other medicines with your doctor to see which can be resumed after surgery.  Use eyedrops or take medicine as prescribed by your doctor. Document Released: 11/30/2011 Document Revised: 03/04/2012 Document Reviewed: 11/30/2011 [facility] Patient Information 2013 [place], [place].    PATIENT INSTRUCTIONS POST-ANESTHESIA  IMMEDIATELY FOLLOWING SURGERY:  Do not drive or operate machinery for the first twenty four hours after surgery.  Do not make any important decisions for twenty four hours after surgery or while taking narcotic pain medications or sedatives.  If you develop intractable nausea and vomiting or a severe headache please notify your doctor immediately.  FOLLOW-UP:  Please make an appointment with your surgeon as instructed. You do not need to follow up with anesthesia unless specifically instructed to do so.  WOUND CARE INSTRUCTIONS (if applicable):  Keep a dry clean dressing on the anesthesia/puncture wound site if there is drainage.  Once the wound has quit draining you may leave it open to air.  Generally you should leave the bandage intact for twenty four hours unless there is drainage.  If the epidural site drains for more than 36-48 hours please call the anesthesia department.  QUESTIONS?:  Please feel free to call your physician or the hospital operator if you have any questions, and they will be happy to assist you.

## 2012-12-11 ENCOUNTER — Encounter (HOSPITAL_COMMUNITY): Payer: Self-pay

## 2012-12-11 ENCOUNTER — Encounter (HOSPITAL_COMMUNITY)
Admission: RE | Admit: 2012-12-11 | Discharge: 2012-12-11 | Disposition: A | Discharge status: Home / Self Care | Payer: Medicare Other | Source: Ambulatory Visit | Attending: Ophthalmology | Admitting: Ophthalmology

## 2012-12-11 DIAGNOSIS — H2589 Other age-related cataract: Secondary | ICD-10-CM | POA: Diagnosis not present

## 2012-12-11 DIAGNOSIS — Z01812 Encounter for preprocedural laboratory examination: Secondary | ICD-10-CM | POA: Diagnosis not present

## 2012-12-11 DIAGNOSIS — I10 Essential (primary) hypertension: Secondary | ICD-10-CM | POA: Diagnosis not present

## 2012-12-11 DIAGNOSIS — J449 Chronic obstructive pulmonary disease, unspecified: Secondary | ICD-10-CM | POA: Diagnosis not present

## 2012-12-11 LAB — BASIC METABOLIC PANEL
BUN: 24 mg/dL — ABNORMAL HIGH (ref 6–23)
CO2: 32 mEq/L (ref 19–32)
Calcium: 9.5 mg/dL (ref 8.4–10.5)
Chloride: 105 mEq/L (ref 96–112)
Creatinine, Ser: 1.33 mg/dL (ref 0.50–1.35)
GFR calc Af Amer: 60 mL/min — ABNORMAL LOW (ref >90)
GFR calc non Af Amer: 52 mL/min — ABNORMAL LOW (ref >90)
Glucose, Bld: 99 mg/dL (ref 70–99)
Potassium: 4.9 mEq/L (ref 3.5–5.1)
Sodium: 143 mEq/L (ref 135–145)

## 2012-12-11 LAB — HEMOGLOBIN AND HEMATOCRIT, BLOOD
HCT: 43.3 % (ref 39.0–52.0)
Hemoglobin: 13.8 g/dL (ref 13.0–17.0)

## 2012-12-11 MED ORDER — CYCLOPENTOLATE-PHENYLEPHRINE 0.2-1 % OP SOLN: 1.0000 [drp] | OPHTHALMIC | Status: AC

## 2012-12-11 MED ORDER — PHENYLEPHRINE HCL 2.5 % OP SOLN: 1.0000 [drp] | OPHTHALMIC | Status: AC

## 2012-12-11 MED ORDER — TETRACAINE HCL 0.5 % OP SOLN: 1.0000 [drp] | OPHTHALMIC | Status: AC

## 2012-12-11 MED ORDER — LIDOCAINE HCL 3.5 % OP GEL: 1.0000 "application " | Freq: Once | OPHTHALMIC | Status: DC

## 2012-12-11 MED ORDER — CYCLOPENTOLATE HCL 1 % OP SOLN: 1.0000 [drp] | OPHTHALMIC | Status: AC

## 2012-12-13 MED ORDER — CYCLOPENTOLATE-PHENYLEPHRINE 0.2-1 % OP SOLN
OPHTHALMIC | Status: AC
  Filled 2012-12-13: qty 2

## 2012-12-13 MED ORDER — TETRACAINE HCL 0.5 % OP SOLN
OPHTHALMIC | Status: AC
  Filled 2012-12-13: qty 2

## 2012-12-13 MED ORDER — LIDOCAINE HCL 3.5 % OP GEL
OPHTHALMIC | Status: AC
  Filled 2012-12-13: qty 5

## 2012-12-13 MED ORDER — LIDOCAINE HCL (PF) 1 % IJ SOLN
INTRAMUSCULAR | Status: AC
  Filled 2012-12-13: qty 2

## 2012-12-13 MED ORDER — PHENYLEPHRINE HCL 2.5 % OP SOLN
OPHTHALMIC | Status: AC
  Filled 2012-12-13: qty 2

## 2012-12-13 MED ORDER — NEOMYCIN-POLYMYXIN-DEXAMETH 3.5-10000-0.1 OP OINT
TOPICAL_OINTMENT | OPHTHALMIC | Status: AC
  Filled 2012-12-13: qty 3.5

## 2012-12-16 ENCOUNTER — Ambulatory Visit (HOSPITAL_COMMUNITY)
Admission: RE | Admit: 2012-12-16 | Discharge: 2012-12-16 | Disposition: A | Discharge status: Home / Self Care | Payer: Medicare Other | Source: Ambulatory Visit | Attending: Ophthalmology | Admitting: Ophthalmology

## 2012-12-16 ENCOUNTER — Encounter (HOSPITAL_COMMUNITY)
Admission: RE | Disposition: A | Discharge status: Home / Self Care | Payer: Self-pay | Source: Ambulatory Visit | Attending: Ophthalmology

## 2012-12-16 ENCOUNTER — Encounter (HOSPITAL_COMMUNITY): Payer: Self-pay | Admitting: *Deleted

## 2012-12-16 ENCOUNTER — Encounter (HOSPITAL_COMMUNITY): Payer: Self-pay | Admitting: Anesthesiology

## 2012-12-16 ENCOUNTER — Ambulatory Visit (HOSPITAL_COMMUNITY): Payer: Medicare Other | Admitting: Anesthesiology

## 2012-12-16 DIAGNOSIS — I10 Essential (primary) hypertension: Secondary | ICD-10-CM | POA: Insufficient documentation

## 2012-12-16 DIAGNOSIS — H269 Unspecified cataract: Secondary | ICD-10-CM | POA: Diagnosis not present

## 2012-12-16 DIAGNOSIS — J449 Chronic obstructive pulmonary disease, unspecified: Secondary | ICD-10-CM | POA: Diagnosis not present

## 2012-12-16 DIAGNOSIS — H2510 Age-related nuclear cataract, unspecified eye: Secondary | ICD-10-CM | POA: Diagnosis not present

## 2012-12-16 DIAGNOSIS — Z01812 Encounter for preprocedural laboratory examination: Secondary | ICD-10-CM | POA: Insufficient documentation

## 2012-12-16 DIAGNOSIS — H2589 Other age-related cataract: Secondary | ICD-10-CM | POA: Diagnosis not present

## 2012-12-16 DIAGNOSIS — J4489 Other specified chronic obstructive pulmonary disease: Secondary | ICD-10-CM | POA: Insufficient documentation

## 2012-12-16 HISTORY — PX: CATARACT EXTRACTION W/PHACO: SHX586

## 2012-12-16 SURGERY — PHACOEMULSIFICATION, CATARACT, WITH IOL INSERTION
Anesthesia: Monitor Anesthesia Care | Site: Eye | Laterality: Left | Wound class: Clean

## 2012-12-16 MED ORDER — TETRACAINE HCL 0.5 % OP SOLN
1.0000 [drp] | OPHTHALMIC | Status: AC
  Administered 2012-12-16 (×3): 1 [drp] via OPHTHALMIC

## 2012-12-16 MED ORDER — LIDOCAINE HCL 3.5 % OP GEL
1.0000 "application " | Freq: Once | OPHTHALMIC | Status: AC
  Administered 2012-12-16: 1 via OPHTHALMIC

## 2012-12-16 MED ORDER — CYCLOPENTOLATE-PHENYLEPHRINE 0.2-1 % OP SOLN
1.0000 [drp] | OPHTHALMIC | Status: AC
  Administered 2012-12-16 (×3): 1 [drp] via OPHTHALMIC

## 2012-12-16 MED ORDER — NEOMYCIN-POLYMYXIN-DEXAMETH 0.1 % OP OINT
TOPICAL_OINTMENT | OPHTHALMIC | Status: DC | PRN
  Administered 2012-12-16: 1 via OPHTHALMIC

## 2012-12-16 MED ORDER — POVIDONE-IODINE 5 % OP SOLN
OPHTHALMIC | Status: DC | PRN
  Administered 2012-12-16: 1 via OPHTHALMIC

## 2012-12-16 MED ORDER — PHENYLEPHRINE HCL 2.5 % OP SOLN
1.0000 [drp] | OPHTHALMIC | Status: AC
  Administered 2012-12-16 (×3): 1 [drp] via OPHTHALMIC

## 2012-12-16 MED ORDER — EPINEPHRINE HCL 1 MG/ML IJ SOLN
INTRAMUSCULAR | Status: AC
  Filled 2012-12-16: qty 1

## 2012-12-16 MED ORDER — EPINEPHRINE HCL 1 MG/ML IJ SOLN
INTRAOCULAR | Status: DC | PRN
  Administered 2012-12-16: 08:00:00

## 2012-12-16 MED ORDER — LACTATED RINGERS IV SOLN
INTRAVENOUS | Status: DC
  Administered 2012-12-16: 08:00:00 via INTRAVENOUS

## 2012-12-16 MED ORDER — LIDOCAINE HCL (PF) 1 % IJ SOLN
INTRAMUSCULAR | Status: DC | PRN
  Administered 2012-12-16: .6 mL

## 2012-12-16 MED ORDER — BSS IO SOLN
INTRAOCULAR | Status: DC | PRN
  Administered 2012-12-16: 15 mL via INTRAOCULAR

## 2012-12-16 MED ORDER — MIDAZOLAM HCL 2 MG/2ML IJ SOLN
INTRAMUSCULAR | Status: AC
  Filled 2012-12-16: qty 2

## 2012-12-16 MED ORDER — PROVISC 10 MG/ML IO SOLN
INTRAOCULAR | Status: DC | PRN
  Administered 2012-12-16: 8.5 mg via INTRAOCULAR

## 2012-12-16 MED ORDER — MIDAZOLAM HCL 2 MG/2ML IJ SOLN
1.0000 mg | INTRAMUSCULAR | Status: DC | PRN
  Administered 2012-12-16: 2 mg via INTRAVENOUS

## 2012-12-16 SURGICAL SUPPLY — 32 items

## 2012-12-16 NOTE — H&P (Signed)
I have reviewed the H&P, the patient was re-examined, and I have identified no interval changes in medical condition and plan of care since the history and physical of record  

## 2012-12-16 NOTE — Transfer of Care (Signed)
Immediate Anesthesia Transfer of Care Note  Patient: Kyle Owen  Procedure(s) Performed: Procedure(s) (LRB): CATARACT EXTRACTION PHACO AND INTRAOCULAR LENS PLACEMENT (IOC) (Left)  Patient Location: Shortstay  Anesthesia Type: MAC  Level of Consciousness: awake  Airway & Oxygen Therapy: Patient Spontanous Breathing   Post-op Assessment: Report given to PACU RN, Post -op Vital signs reviewed and stable and Patient moving all extremities  Post vital signs: Reviewed and stable  Complications: No apparent anesthesia complications

## 2012-12-16 NOTE — Op Note (Signed)
[name], Kyle Owen               ACCOUNT NO.:  [id number]  MEDICAL RECORD NO.:  [id number]  LOCATION:  [facility]                          FACILITY:  [facility]  PHYSICIAN:  Susanne Greenhouse, MD       DATE OF BIRTH:  03-06-1941  DATE OF PROCEDURE:  12/16/2012 DATE OF DISCHARGE:  12/16/2012                              OPERATIVE REPORT   PREOPERATIVE DIAGNOSIS:  Combined cataract, left eye, diagnosis code 366.19.  POSTOPERATIVE DIAGNOSIS:  Combined cataract, left eye, diagnosis code 366.19.  OPERATION PERFORMED:  Phacoemulsification with posterior chamber intraocular lens implantation, left eye.  SURGEON:  Bonne Dolores. Destina Mantei, MD  ANESTHESIA:  Topical with monitored anesthesia care and IV sedation.  OPERATIVE SUMMARY:  In the preoperative area, dilating drops were placed into the left eye.  The patient was then brought into the operating room where he was placed under general anesthesia.  The eye was then prepped and draped.  Beginning with a 75 blade, a paracentesis port was made at the surgeon's 2 o'clock position.  The anterior chamber was then filled with a 1% nonpreserved lidocaine solution with epinephrine.  This was followed by Viscoat to deepen the chamber.  A small fornix-based peritomy was performed superiorly.  Next, a single iris hook was placed through the limbus superiorly.  A 2.4-mm keratome blade was then used to make a clear corneal incision over the iris hook.  A bent cystotome needle and Utrata forceps were used to create a continuous tear capsulotomy.  Hydrodissection was performed using balanced salt solution on a fine cannula.  The lens nucleus was then removed using phacoemulsification in a quadrant cracking technique.  The cortical material was then removed with irrigation and aspiration.  The capsular bag and anterior chamber were refilled with Provisc.  The wound was widened to approximately 3 mm and a posterior chamber intraocular lens was placed into the capsular bag  without difficulty using an [employer] lens injecting system.  A single 10-0 nylon suture was then used to close the incision as well as stromal hydration.  The Provisc was removed from the anterior chamber and capsular bag with irrigation and aspiration.  At this point, the wounds were tested for leak, which were negative.  The anterior chamber remained deep and stable.  The patient tolerated the procedure well.  There were no operative complications, and he awoke from general anesthesia without problem.  No surgical specimens.  Prosthetic device used is a Theme park manager, model EnVista, model number MX60, power of 22.5, serial number is [id number].          ______________________________ Susanne Greenhouse, MD     KEH/MEDQ  D:  12/16/2012  T:  12/16/2012  Job:  [id number]

## 2012-12-16 NOTE — Brief Op Note (Signed)
Pre-Op Dx: Cataract OS Post-Op Dx: Cataract OS Surgeon: Bernard Donahoo Anesthesia: Topical with MAC Surgery: Cataract Extraction with Intraocular lens Implant OS Implant: B&L enVista Specimen: None Complications: None 

## 2012-12-16 NOTE — Anesthesia Procedure Notes (Signed)
Procedure Name: MAC Date/Time: 12/16/2012 8:15 AM Performed by: Franco Nones Pre-anesthesia Checklist: Patient identified, Emergency Drugs available, Suction available, Timeout performed and Patient being monitored Patient Re-evaluated:Patient Re-evaluated prior to inductionOxygen Delivery Method: Nasal Cannula

## 2012-12-16 NOTE — Anesthesia Postprocedure Evaluation (Signed)
  Anesthesia Post-op Note  Patient: Kyle Owen  Procedure(s) Performed: Procedure(s) (LRB): CATARACT EXTRACTION PHACO AND INTRAOCULAR LENS PLACEMENT (IOC) (Left)  Patient Location:  Short Stay  Anesthesia Type: MAC  Level of Consciousness: awake  Airway and Oxygen Therapy: Patient Spontanous Breathing  Post-op Pain: none  Post-op Assessment: Post-op Vital signs reviewed, [name] Cardiovascular Status Stable, Respiratory Function Stable, Patent Airway, No signs of Nausea or vomiting and Pain level controlled  Post-op Vital Signs: Reviewed and stable  Complications: No apparent anesthesia complications

## 2012-12-16 NOTE — Anesthesia Preprocedure Evaluation (Signed)
Anesthesia Evaluation  Patient identified by MRN, date of birth, ID band Patient awake    Reviewed: Allergy & Precautions, H&P , NPO status , [name] Chart, lab work & pertinent test results  Airway Mallampati: II TM Distance: >3 FB     Dental  (+) Teeth Intact   Pulmonary shortness of breath, pneumonia -, resolved, COPD   Pulmonary exam normal       Cardiovascular hypertension, Pt. on medications Rhythm:Regular Rate:Bradycardia     Neuro/Psych  Headaches, PSYCHIATRIC DISORDERS Depression    GI/Hepatic GERD-  Medicated and Controlled,  Endo/Other    Renal/GU      Musculoskeletal   Abdominal   Peds  Hematology   Anesthesia Other Findings   Reproductive/Obstetrics                           Anesthesia Physical Anesthesia Plan  ASA: III  Anesthesia Plan: MAC   Post-op Pain Management:    Induction: Intravenous  Airway Management Planned: Nasal Cannula  Additional Equipment:   Intra-op Plan:   Post-operative Plan:   Informed Consent: I have reviewed the patients History and Physical, chart, labs and discussed the procedure including the risks, benefits and alternatives for the proposed anesthesia with the patient or authorized representative who has indicated his/her understanding and acceptance.     Plan Discussed with:   Anesthesia Plan Comments:         Anesthesia Quick Evaluation

## 2012-12-20 ENCOUNTER — Encounter (HOSPITAL_COMMUNITY): Payer: Self-pay | Admitting: Ophthalmology

## 2012-12-25 HISTORY — PX: POSTERIOR LUMBAR FUSION: SHX6036

## 2013-01-02 ENCOUNTER — Ambulatory Visit (INDEPENDENT_AMBULATORY_CARE_PROVIDER_SITE_OTHER): Payer: Medicare Other | Admitting: Otolaryngology

## 2013-01-02 DIAGNOSIS — R490 Dysphonia: Secondary | ICD-10-CM | POA: Diagnosis not present

## 2013-01-02 DIAGNOSIS — K219 Gastro-esophageal reflux disease without esophagitis: Secondary | ICD-10-CM

## 2013-02-21 DIAGNOSIS — Z79899 Other long term (current) drug therapy: Secondary | ICD-10-CM | POA: Diagnosis not present

## 2013-02-21 DIAGNOSIS — M1990 Unspecified osteoarthritis, unspecified site: Secondary | ICD-10-CM | POA: Diagnosis not present

## 2013-02-21 DIAGNOSIS — G43009 Migraine without aura, not intractable, without status migrainosus: Secondary | ICD-10-CM | POA: Diagnosis not present

## 2013-03-06 DIAGNOSIS — R609 Edema, unspecified: Secondary | ICD-10-CM | POA: Diagnosis not present

## 2013-03-06 DIAGNOSIS — N400 Enlarged prostate without lower urinary tract symptoms: Secondary | ICD-10-CM | POA: Diagnosis not present

## 2013-03-06 DIAGNOSIS — J449 Chronic obstructive pulmonary disease, unspecified: Secondary | ICD-10-CM | POA: Diagnosis not present

## 2013-03-07 ENCOUNTER — Ambulatory Visit (HOSPITAL_COMMUNITY)
Admission: RE | Admit: 2013-03-07 | Discharge: 2013-03-07 | Disposition: A | Discharge status: Home / Self Care | Payer: Medicare Other | Source: Ambulatory Visit | Attending: Internal Medicine | Admitting: Internal Medicine

## 2013-03-07 DIAGNOSIS — J449 Chronic obstructive pulmonary disease, unspecified: Secondary | ICD-10-CM | POA: Diagnosis not present

## 2013-03-07 DIAGNOSIS — I359 Nonrheumatic aortic valve disorder, unspecified: Secondary | ICD-10-CM | POA: Diagnosis not present

## 2013-03-07 DIAGNOSIS — R609 Edema, unspecified: Secondary | ICD-10-CM | POA: Insufficient documentation

## 2013-03-07 DIAGNOSIS — R42 Dizziness and giddiness: Secondary | ICD-10-CM | POA: Diagnosis not present

## 2013-03-07 DIAGNOSIS — J4489 Other specified chronic obstructive pulmonary disease: Secondary | ICD-10-CM | POA: Insufficient documentation

## 2013-03-07 DIAGNOSIS — I10 Essential (primary) hypertension: Secondary | ICD-10-CM | POA: Diagnosis not present

## 2013-03-07 NOTE — Progress Notes (Signed)
*  PRELIMINARY RESULTS* Echocardiogram 2D Echocardiogram has been performed.  Conrad Burleigh 03/07/2013, 4:11 PM

## 2013-04-04 ENCOUNTER — Other Ambulatory Visit (HOSPITAL_COMMUNITY): Payer: Self-pay | Admitting: Internal Medicine

## 2013-04-04 DIAGNOSIS — R609 Edema, unspecified: Secondary | ICD-10-CM | POA: Diagnosis not present

## 2013-04-04 DIAGNOSIS — IMO0002 Reserved for concepts with insufficient information to code with codable children: Secondary | ICD-10-CM | POA: Diagnosis not present

## 2013-04-04 DIAGNOSIS — J449 Chronic obstructive pulmonary disease, unspecified: Secondary | ICD-10-CM | POA: Diagnosis not present

## 2013-04-04 DIAGNOSIS — M5410 Radiculopathy, site unspecified: Secondary | ICD-10-CM

## 2013-04-09 ENCOUNTER — Encounter (HOSPITAL_COMMUNITY): Payer: Self-pay

## 2013-04-09 ENCOUNTER — Ambulatory Visit (HOSPITAL_COMMUNITY)
Admission: RE | Admit: 2013-04-09 | Discharge: 2013-04-09 | Disposition: A | Discharge status: Home / Self Care | Payer: Medicare Other | Source: Ambulatory Visit | Attending: Internal Medicine | Admitting: Internal Medicine

## 2013-04-09 DIAGNOSIS — M545 Low back pain, unspecified: Secondary | ICD-10-CM | POA: Diagnosis not present

## 2013-04-09 DIAGNOSIS — M5410 Radiculopathy, site unspecified: Secondary | ICD-10-CM

## 2013-04-09 DIAGNOSIS — M48061 Spinal stenosis, lumbar region without neurogenic claudication: Secondary | ICD-10-CM | POA: Diagnosis not present

## 2013-04-09 DIAGNOSIS — M5137 Other intervertebral disc degeneration, lumbosacral region: Secondary | ICD-10-CM | POA: Diagnosis not present

## 2013-04-09 DIAGNOSIS — M47817 Spondylosis without myelopathy or radiculopathy, lumbosacral region: Secondary | ICD-10-CM | POA: Diagnosis not present

## 2013-04-09 DIAGNOSIS — M51379 Other intervertebral disc degeneration, lumbosacral region without mention of lumbar back pain or lower extremity pain: Secondary | ICD-10-CM | POA: Insufficient documentation

## 2013-05-21 DIAGNOSIS — J209 Acute bronchitis, unspecified: Secondary | ICD-10-CM | POA: Diagnosis not present

## 2013-06-05 DIAGNOSIS — M47817 Spondylosis without myelopathy or radiculopathy, lumbosacral region: Secondary | ICD-10-CM | POA: Diagnosis not present

## 2013-07-18 DIAGNOSIS — Z01810 Encounter for preprocedural cardiovascular examination: Secondary | ICD-10-CM | POA: Diagnosis not present

## 2013-07-18 DIAGNOSIS — M47817 Spondylosis without myelopathy or radiculopathy, lumbosacral region: Secondary | ICD-10-CM | POA: Diagnosis not present

## 2013-07-18 DIAGNOSIS — Z01818 Encounter for other preprocedural examination: Secondary | ICD-10-CM | POA: Diagnosis not present

## 2013-07-18 DIAGNOSIS — J449 Chronic obstructive pulmonary disease, unspecified: Secondary | ICD-10-CM | POA: Diagnosis not present

## 2013-07-22 DIAGNOSIS — E669 Obesity, unspecified: Secondary | ICD-10-CM | POA: Diagnosis not present

## 2013-07-22 DIAGNOSIS — M539 Dorsopathy, unspecified: Secondary | ICD-10-CM | POA: Diagnosis not present

## 2013-07-22 DIAGNOSIS — Z888 Allergy status to other drugs, medicaments and biological substances status: Secondary | ICD-10-CM | POA: Diagnosis not present

## 2013-07-22 DIAGNOSIS — Z6829 Body mass index (BMI) 29.0-29.9, adult: Secondary | ICD-10-CM | POA: Diagnosis not present

## 2013-07-22 DIAGNOSIS — R32 Unspecified urinary incontinence: Secondary | ICD-10-CM | POA: Diagnosis not present

## 2013-07-22 DIAGNOSIS — J4489 Other specified chronic obstructive pulmonary disease: Secondary | ICD-10-CM | POA: Diagnosis not present

## 2013-07-22 DIAGNOSIS — Z79899 Other long term (current) drug therapy: Secondary | ICD-10-CM | POA: Diagnosis not present

## 2013-07-22 DIAGNOSIS — J449 Chronic obstructive pulmonary disease, unspecified: Secondary | ICD-10-CM | POA: Diagnosis not present

## 2013-07-22 DIAGNOSIS — M47817 Spondylosis without myelopathy or radiculopathy, lumbosacral region: Secondary | ICD-10-CM | POA: Diagnosis not present

## 2013-07-22 DIAGNOSIS — F329 Major depressive disorder, single episode, unspecified: Secondary | ICD-10-CM | POA: Diagnosis present

## 2013-07-22 DIAGNOSIS — F3289 Other specified depressive episodes: Secondary | ICD-10-CM | POA: Diagnosis not present

## 2013-07-25 DIAGNOSIS — F329 Major depressive disorder, single episode, unspecified: Secondary | ICD-10-CM | POA: Diagnosis not present

## 2013-07-25 DIAGNOSIS — Z4789 Encounter for other orthopedic aftercare: Secondary | ICD-10-CM | POA: Diagnosis not present

## 2013-07-25 DIAGNOSIS — J449 Chronic obstructive pulmonary disease, unspecified: Secondary | ICD-10-CM | POA: Diagnosis not present

## 2013-07-25 DIAGNOSIS — IMO0001 Reserved for inherently not codable concepts without codable children: Secondary | ICD-10-CM | POA: Diagnosis not present

## 2013-07-28 DIAGNOSIS — F329 Major depressive disorder, single episode, unspecified: Secondary | ICD-10-CM | POA: Diagnosis not present

## 2013-07-28 DIAGNOSIS — J449 Chronic obstructive pulmonary disease, unspecified: Secondary | ICD-10-CM | POA: Diagnosis not present

## 2013-07-28 DIAGNOSIS — IMO0001 Reserved for inherently not codable concepts without codable children: Secondary | ICD-10-CM | POA: Diagnosis not present

## 2013-07-28 DIAGNOSIS — Z4789 Encounter for other orthopedic aftercare: Secondary | ICD-10-CM | POA: Diagnosis not present

## 2013-07-30 DIAGNOSIS — F329 Major depressive disorder, single episode, unspecified: Secondary | ICD-10-CM | POA: Diagnosis not present

## 2013-07-30 DIAGNOSIS — IMO0001 Reserved for inherently not codable concepts without codable children: Secondary | ICD-10-CM | POA: Diagnosis not present

## 2013-07-30 DIAGNOSIS — J449 Chronic obstructive pulmonary disease, unspecified: Secondary | ICD-10-CM | POA: Diagnosis not present

## 2013-07-30 DIAGNOSIS — Z4789 Encounter for other orthopedic aftercare: Secondary | ICD-10-CM | POA: Diagnosis not present

## 2013-07-31 DIAGNOSIS — Z981 Arthrodesis status: Secondary | ICD-10-CM | POA: Diagnosis not present

## 2013-07-31 DIAGNOSIS — M47817 Spondylosis without myelopathy or radiculopathy, lumbosacral region: Secondary | ICD-10-CM | POA: Diagnosis not present

## 2013-08-01 DIAGNOSIS — F329 Major depressive disorder, single episode, unspecified: Secondary | ICD-10-CM | POA: Diagnosis not present

## 2013-08-01 DIAGNOSIS — J449 Chronic obstructive pulmonary disease, unspecified: Secondary | ICD-10-CM | POA: Diagnosis not present

## 2013-08-01 DIAGNOSIS — Z4789 Encounter for other orthopedic aftercare: Secondary | ICD-10-CM | POA: Diagnosis not present

## 2013-08-01 DIAGNOSIS — IMO0001 Reserved for inherently not codable concepts without codable children: Secondary | ICD-10-CM | POA: Diagnosis not present

## 2013-08-04 DIAGNOSIS — F329 Major depressive disorder, single episode, unspecified: Secondary | ICD-10-CM | POA: Diagnosis not present

## 2013-08-04 DIAGNOSIS — Z4789 Encounter for other orthopedic aftercare: Secondary | ICD-10-CM | POA: Diagnosis not present

## 2013-08-04 DIAGNOSIS — IMO0001 Reserved for inherently not codable concepts without codable children: Secondary | ICD-10-CM | POA: Diagnosis not present

## 2013-08-04 DIAGNOSIS — J449 Chronic obstructive pulmonary disease, unspecified: Secondary | ICD-10-CM | POA: Diagnosis not present

## 2013-08-05 DIAGNOSIS — IMO0001 Reserved for inherently not codable concepts without codable children: Secondary | ICD-10-CM | POA: Diagnosis not present

## 2013-08-05 DIAGNOSIS — F329 Major depressive disorder, single episode, unspecified: Secondary | ICD-10-CM | POA: Diagnosis not present

## 2013-08-05 DIAGNOSIS — Z4789 Encounter for other orthopedic aftercare: Secondary | ICD-10-CM | POA: Diagnosis not present

## 2013-08-05 DIAGNOSIS — J449 Chronic obstructive pulmonary disease, unspecified: Secondary | ICD-10-CM | POA: Diagnosis not present

## 2013-08-06 DIAGNOSIS — F329 Major depressive disorder, single episode, unspecified: Secondary | ICD-10-CM | POA: Diagnosis not present

## 2013-08-06 DIAGNOSIS — J449 Chronic obstructive pulmonary disease, unspecified: Secondary | ICD-10-CM | POA: Diagnosis not present

## 2013-08-06 DIAGNOSIS — Z4789 Encounter for other orthopedic aftercare: Secondary | ICD-10-CM | POA: Diagnosis not present

## 2013-08-06 DIAGNOSIS — IMO0001 Reserved for inherently not codable concepts without codable children: Secondary | ICD-10-CM | POA: Diagnosis not present

## 2013-08-08 DIAGNOSIS — IMO0001 Reserved for inherently not codable concepts without codable children: Secondary | ICD-10-CM | POA: Diagnosis not present

## 2013-08-08 DIAGNOSIS — J449 Chronic obstructive pulmonary disease, unspecified: Secondary | ICD-10-CM | POA: Diagnosis not present

## 2013-08-08 DIAGNOSIS — Z4789 Encounter for other orthopedic aftercare: Secondary | ICD-10-CM | POA: Diagnosis not present

## 2013-08-08 DIAGNOSIS — F329 Major depressive disorder, single episode, unspecified: Secondary | ICD-10-CM | POA: Diagnosis not present

## 2013-08-11 DIAGNOSIS — IMO0001 Reserved for inherently not codable concepts without codable children: Secondary | ICD-10-CM | POA: Diagnosis not present

## 2013-08-11 DIAGNOSIS — Z4789 Encounter for other orthopedic aftercare: Secondary | ICD-10-CM | POA: Diagnosis not present

## 2013-08-11 DIAGNOSIS — F329 Major depressive disorder, single episode, unspecified: Secondary | ICD-10-CM | POA: Diagnosis not present

## 2013-08-11 DIAGNOSIS — J449 Chronic obstructive pulmonary disease, unspecified: Secondary | ICD-10-CM | POA: Diagnosis not present

## 2013-08-12 DIAGNOSIS — F329 Major depressive disorder, single episode, unspecified: Secondary | ICD-10-CM | POA: Diagnosis not present

## 2013-08-12 DIAGNOSIS — J449 Chronic obstructive pulmonary disease, unspecified: Secondary | ICD-10-CM | POA: Diagnosis not present

## 2013-08-12 DIAGNOSIS — IMO0001 Reserved for inherently not codable concepts without codable children: Secondary | ICD-10-CM | POA: Diagnosis not present

## 2013-08-12 DIAGNOSIS — Z4789 Encounter for other orthopedic aftercare: Secondary | ICD-10-CM | POA: Diagnosis not present

## 2013-08-14 DIAGNOSIS — Z4789 Encounter for other orthopedic aftercare: Secondary | ICD-10-CM | POA: Diagnosis not present

## 2013-08-14 DIAGNOSIS — J449 Chronic obstructive pulmonary disease, unspecified: Secondary | ICD-10-CM | POA: Diagnosis not present

## 2013-08-14 DIAGNOSIS — IMO0001 Reserved for inherently not codable concepts without codable children: Secondary | ICD-10-CM | POA: Diagnosis not present

## 2013-08-14 DIAGNOSIS — F329 Major depressive disorder, single episode, unspecified: Secondary | ICD-10-CM | POA: Diagnosis not present

## 2013-08-15 DIAGNOSIS — F329 Major depressive disorder, single episode, unspecified: Secondary | ICD-10-CM | POA: Diagnosis not present

## 2013-08-15 DIAGNOSIS — IMO0001 Reserved for inherently not codable concepts without codable children: Secondary | ICD-10-CM | POA: Diagnosis not present

## 2013-08-15 DIAGNOSIS — J449 Chronic obstructive pulmonary disease, unspecified: Secondary | ICD-10-CM | POA: Diagnosis not present

## 2013-08-15 DIAGNOSIS — Z4789 Encounter for other orthopedic aftercare: Secondary | ICD-10-CM | POA: Diagnosis not present

## 2013-08-18 DIAGNOSIS — Z4789 Encounter for other orthopedic aftercare: Secondary | ICD-10-CM | POA: Diagnosis not present

## 2013-08-18 DIAGNOSIS — IMO0001 Reserved for inherently not codable concepts without codable children: Secondary | ICD-10-CM | POA: Diagnosis not present

## 2013-08-18 DIAGNOSIS — J449 Chronic obstructive pulmonary disease, unspecified: Secondary | ICD-10-CM | POA: Diagnosis not present

## 2013-08-18 DIAGNOSIS — F329 Major depressive disorder, single episode, unspecified: Secondary | ICD-10-CM | POA: Diagnosis not present

## 2013-08-19 DIAGNOSIS — J449 Chronic obstructive pulmonary disease, unspecified: Secondary | ICD-10-CM | POA: Diagnosis not present

## 2013-08-19 DIAGNOSIS — Z4789 Encounter for other orthopedic aftercare: Secondary | ICD-10-CM | POA: Diagnosis not present

## 2013-08-19 DIAGNOSIS — F329 Major depressive disorder, single episode, unspecified: Secondary | ICD-10-CM | POA: Diagnosis not present

## 2013-08-19 DIAGNOSIS — IMO0001 Reserved for inherently not codable concepts without codable children: Secondary | ICD-10-CM | POA: Diagnosis not present

## 2013-08-20 DIAGNOSIS — J449 Chronic obstructive pulmonary disease, unspecified: Secondary | ICD-10-CM | POA: Diagnosis not present

## 2013-08-20 DIAGNOSIS — IMO0001 Reserved for inherently not codable concepts without codable children: Secondary | ICD-10-CM | POA: Diagnosis not present

## 2013-08-20 DIAGNOSIS — F329 Major depressive disorder, single episode, unspecified: Secondary | ICD-10-CM | POA: Diagnosis not present

## 2013-08-20 DIAGNOSIS — Z4789 Encounter for other orthopedic aftercare: Secondary | ICD-10-CM | POA: Diagnosis not present

## 2013-10-08 DIAGNOSIS — IMO0001 Reserved for inherently not codable concepts without codable children: Secondary | ICD-10-CM | POA: Diagnosis not present

## 2013-10-08 DIAGNOSIS — M545 Low back pain, unspecified: Secondary | ICD-10-CM | POA: Diagnosis not present

## 2013-10-08 DIAGNOSIS — M6281 Muscle weakness (generalized): Secondary | ICD-10-CM | POA: Diagnosis not present

## 2013-10-10 DIAGNOSIS — M545 Low back pain, unspecified: Secondary | ICD-10-CM | POA: Diagnosis not present

## 2013-10-10 DIAGNOSIS — IMO0001 Reserved for inherently not codable concepts without codable children: Secondary | ICD-10-CM | POA: Diagnosis not present

## 2013-10-10 DIAGNOSIS — M6281 Muscle weakness (generalized): Secondary | ICD-10-CM | POA: Diagnosis not present

## 2013-10-14 DIAGNOSIS — IMO0001 Reserved for inherently not codable concepts without codable children: Secondary | ICD-10-CM | POA: Diagnosis not present

## 2013-10-14 DIAGNOSIS — M545 Low back pain, unspecified: Secondary | ICD-10-CM | POA: Diagnosis not present

## 2013-10-14 DIAGNOSIS — M6281 Muscle weakness (generalized): Secondary | ICD-10-CM | POA: Diagnosis not present

## 2013-10-17 DIAGNOSIS — M545 Low back pain, unspecified: Secondary | ICD-10-CM | POA: Diagnosis not present

## 2013-10-17 DIAGNOSIS — IMO0001 Reserved for inherently not codable concepts without codable children: Secondary | ICD-10-CM | POA: Diagnosis not present

## 2013-10-17 DIAGNOSIS — M6281 Muscle weakness (generalized): Secondary | ICD-10-CM | POA: Diagnosis not present

## 2013-10-21 DIAGNOSIS — M545 Low back pain, unspecified: Secondary | ICD-10-CM | POA: Diagnosis not present

## 2013-10-21 DIAGNOSIS — M6281 Muscle weakness (generalized): Secondary | ICD-10-CM | POA: Diagnosis not present

## 2013-10-21 DIAGNOSIS — IMO0001 Reserved for inherently not codable concepts without codable children: Secondary | ICD-10-CM | POA: Diagnosis not present

## 2013-10-23 DIAGNOSIS — M545 Low back pain, unspecified: Secondary | ICD-10-CM | POA: Diagnosis not present

## 2013-10-23 DIAGNOSIS — IMO0001 Reserved for inherently not codable concepts without codable children: Secondary | ICD-10-CM | POA: Diagnosis not present

## 2013-10-23 DIAGNOSIS — M6281 Muscle weakness (generalized): Secondary | ICD-10-CM | POA: Diagnosis not present

## 2013-10-28 DIAGNOSIS — M545 Low back pain, unspecified: Secondary | ICD-10-CM | POA: Diagnosis not present

## 2013-10-28 DIAGNOSIS — IMO0001 Reserved for inherently not codable concepts without codable children: Secondary | ICD-10-CM | POA: Diagnosis not present

## 2013-10-28 DIAGNOSIS — M6281 Muscle weakness (generalized): Secondary | ICD-10-CM | POA: Diagnosis not present

## 2013-10-30 DIAGNOSIS — Z23 Encounter for immunization: Secondary | ICD-10-CM | POA: Diagnosis not present

## 2013-10-30 DIAGNOSIS — M25469 Effusion, unspecified knee: Secondary | ICD-10-CM | POA: Diagnosis not present

## 2013-12-10 DIAGNOSIS — M47817 Spondylosis without myelopathy or radiculopathy, lumbosacral region: Secondary | ICD-10-CM | POA: Diagnosis not present

## 2013-12-25 HISTORY — PX: KNEE ARTHROSCOPY: SHX127

## 2013-12-26 DIAGNOSIS — Z125 Encounter for screening for malignant neoplasm of prostate: Secondary | ICD-10-CM | POA: Diagnosis not present

## 2013-12-26 DIAGNOSIS — F411 Generalized anxiety disorder: Secondary | ICD-10-CM | POA: Diagnosis not present

## 2013-12-26 DIAGNOSIS — M1990 Unspecified osteoarthritis, unspecified site: Secondary | ICD-10-CM | POA: Diagnosis not present

## 2013-12-26 DIAGNOSIS — Z79899 Other long term (current) drug therapy: Secondary | ICD-10-CM | POA: Diagnosis not present

## 2013-12-26 DIAGNOSIS — G43009 Migraine without aura, not intractable, without status migrainosus: Secondary | ICD-10-CM | POA: Diagnosis not present

## 2014-01-02 DIAGNOSIS — Z0000 Encounter for general adult medical examination without abnormal findings: Secondary | ICD-10-CM | POA: Diagnosis not present

## 2014-01-02 DIAGNOSIS — Z23 Encounter for immunization: Secondary | ICD-10-CM | POA: Diagnosis not present

## 2014-01-02 DIAGNOSIS — I498 Other specified cardiac arrhythmias: Secondary | ICD-10-CM | POA: Diagnosis not present

## 2014-01-06 DIAGNOSIS — IMO0002 Reserved for concepts with insufficient information to code with codable children: Secondary | ICD-10-CM | POA: Diagnosis not present

## 2014-01-07 ENCOUNTER — Other Ambulatory Visit (HOSPITAL_COMMUNITY): Payer: Self-pay | Admitting: Orthopedic Surgery

## 2014-01-07 DIAGNOSIS — M25561 Pain in right knee: Secondary | ICD-10-CM

## 2014-01-13 ENCOUNTER — Ambulatory Visit (HOSPITAL_COMMUNITY)
Admission: RE | Admit: 2014-01-13 | Discharge: 2014-01-13 | Disposition: A | Discharge status: Home / Self Care | Payer: Medicare Other | Source: Ambulatory Visit | Attending: Orthopedic Surgery | Admitting: Orthopedic Surgery

## 2014-01-13 DIAGNOSIS — M25469 Effusion, unspecified knee: Secondary | ICD-10-CM | POA: Diagnosis not present

## 2014-01-13 DIAGNOSIS — M25561 Pain in right knee: Secondary | ICD-10-CM

## 2014-01-13 DIAGNOSIS — M25569 Pain in unspecified knee: Secondary | ICD-10-CM | POA: Insufficient documentation

## 2014-01-13 DIAGNOSIS — M23329 Other meniscus derangements, posterior horn of medial meniscus, unspecified knee: Secondary | ICD-10-CM | POA: Diagnosis not present

## 2014-01-13 DIAGNOSIS — M7120 Synovial cyst of popliteal space [Baker], unspecified knee: Secondary | ICD-10-CM | POA: Insufficient documentation

## 2014-01-13 DIAGNOSIS — IMO0002 Reserved for concepts with insufficient information to code with codable children: Secondary | ICD-10-CM | POA: Insufficient documentation

## 2014-01-13 DIAGNOSIS — X58XXXA Exposure to other specified factors, initial encounter: Secondary | ICD-10-CM | POA: Insufficient documentation

## 2014-01-13 DIAGNOSIS — Y93A1 Activity, exercise machines primarily for cardiorespiratory conditioning: Secondary | ICD-10-CM | POA: Insufficient documentation

## 2014-01-21 DIAGNOSIS — M23305 Other meniscus derangements, unspecified medial meniscus, unspecified knee: Secondary | ICD-10-CM | POA: Diagnosis not present

## 2014-01-21 DIAGNOSIS — M9420 Chondromalacia, unspecified site: Secondary | ICD-10-CM | POA: Diagnosis not present

## 2014-01-21 DIAGNOSIS — M23302 Other meniscus derangements, unspecified lateral meniscus, unspecified knee: Secondary | ICD-10-CM | POA: Diagnosis not present

## 2014-01-21 DIAGNOSIS — S83289A Other tear of lateral meniscus, current injury, unspecified knee, initial encounter: Secondary | ICD-10-CM | POA: Diagnosis not present

## 2014-01-21 DIAGNOSIS — M2240 Chondromalacia patellae, unspecified knee: Secondary | ICD-10-CM | POA: Diagnosis not present

## 2014-01-21 DIAGNOSIS — IMO0002 Reserved for concepts with insufficient information to code with codable children: Secondary | ICD-10-CM | POA: Diagnosis not present

## 2014-01-21 DIAGNOSIS — M659 Synovitis and tenosynovitis, unspecified: Secondary | ICD-10-CM | POA: Diagnosis not present

## 2014-03-19 DIAGNOSIS — M47817 Spondylosis without myelopathy or radiculopathy, lumbosacral region: Secondary | ICD-10-CM | POA: Diagnosis not present

## 2014-03-24 DIAGNOSIS — M25469 Effusion, unspecified knee: Secondary | ICD-10-CM | POA: Diagnosis not present

## 2014-03-24 DIAGNOSIS — G8918 Other acute postprocedural pain: Secondary | ICD-10-CM | POA: Diagnosis not present

## 2014-03-24 DIAGNOSIS — M25569 Pain in unspecified knee: Secondary | ICD-10-CM | POA: Diagnosis not present

## 2014-03-26 DIAGNOSIS — M7989 Other specified soft tissue disorders: Secondary | ICD-10-CM | POA: Diagnosis not present

## 2014-03-26 DIAGNOSIS — Z9889 Other specified postprocedural states: Secondary | ICD-10-CM | POA: Diagnosis not present

## 2014-03-26 DIAGNOSIS — M79609 Pain in unspecified limb: Secondary | ICD-10-CM | POA: Diagnosis not present

## 2014-04-09 DIAGNOSIS — Z23 Encounter for immunization: Secondary | ICD-10-CM | POA: Diagnosis not present

## 2014-04-09 DIAGNOSIS — N289 Disorder of kidney and ureter, unspecified: Secondary | ICD-10-CM | POA: Diagnosis not present

## 2014-04-09 DIAGNOSIS — D696 Thrombocytopenia, unspecified: Secondary | ICD-10-CM | POA: Diagnosis not present

## 2014-04-09 DIAGNOSIS — N400 Enlarged prostate without lower urinary tract symptoms: Secondary | ICD-10-CM | POA: Diagnosis not present

## 2014-05-19 DIAGNOSIS — M25569 Pain in unspecified knee: Secondary | ICD-10-CM | POA: Diagnosis not present

## 2014-06-16 DIAGNOSIS — M1710 Unilateral primary osteoarthritis, unspecified knee: Secondary | ICD-10-CM | POA: Diagnosis not present

## 2014-06-22 DIAGNOSIS — M47812 Spondylosis without myelopathy or radiculopathy, cervical region: Secondary | ICD-10-CM | POA: Diagnosis not present

## 2014-06-22 DIAGNOSIS — M47817 Spondylosis without myelopathy or radiculopathy, lumbosacral region: Secondary | ICD-10-CM | POA: Diagnosis not present

## 2014-06-23 DIAGNOSIS — M1710 Unilateral primary osteoarthritis, unspecified knee: Secondary | ICD-10-CM | POA: Diagnosis not present

## 2014-06-30 DIAGNOSIS — M1710 Unilateral primary osteoarthritis, unspecified knee: Secondary | ICD-10-CM | POA: Diagnosis not present

## 2014-07-16 DIAGNOSIS — N400 Enlarged prostate without lower urinary tract symptoms: Secondary | ICD-10-CM | POA: Diagnosis not present

## 2014-07-16 DIAGNOSIS — J449 Chronic obstructive pulmonary disease, unspecified: Secondary | ICD-10-CM | POA: Diagnosis not present

## 2014-07-16 DIAGNOSIS — J387 Other diseases of larynx: Secondary | ICD-10-CM | POA: Diagnosis not present

## 2014-08-25 DIAGNOSIS — M1710 Unilateral primary osteoarthritis, unspecified knee: Secondary | ICD-10-CM | POA: Diagnosis not present

## 2014-09-01 DIAGNOSIS — M1710 Unilateral primary osteoarthritis, unspecified knee: Secondary | ICD-10-CM | POA: Diagnosis not present

## 2014-09-03 DIAGNOSIS — M79609 Pain in unspecified limb: Secondary | ICD-10-CM | POA: Diagnosis not present

## 2014-09-03 DIAGNOSIS — M7989 Other specified soft tissue disorders: Secondary | ICD-10-CM | POA: Diagnosis not present

## 2014-10-27 ENCOUNTER — Other Ambulatory Visit (HOSPITAL_COMMUNITY): Payer: Self-pay | Admitting: Internal Medicine

## 2014-10-27 DIAGNOSIS — G453 Amaurosis fugax: Secondary | ICD-10-CM

## 2014-10-27 DIAGNOSIS — M1711 Unilateral primary osteoarthritis, right knee: Secondary | ICD-10-CM | POA: Diagnosis not present

## 2014-10-27 DIAGNOSIS — M47812 Spondylosis without myelopathy or radiculopathy, cervical region: Secondary | ICD-10-CM | POA: Diagnosis not present

## 2014-10-27 DIAGNOSIS — Z23 Encounter for immunization: Secondary | ICD-10-CM | POA: Diagnosis not present

## 2014-10-27 DIAGNOSIS — J440 Chronic obstructive pulmonary disease with acute lower respiratory infection: Secondary | ICD-10-CM | POA: Diagnosis not present

## 2014-10-29 ENCOUNTER — Ambulatory Visit (HOSPITAL_COMMUNITY)
Admission: RE | Admit: 2014-10-29 | Discharge: 2014-10-29 | Disposition: A | Discharge status: Home / Self Care | Payer: Medicare Other | Source: Ambulatory Visit | Attending: Internal Medicine | Admitting: Internal Medicine

## 2014-10-29 DIAGNOSIS — I10 Essential (primary) hypertension: Secondary | ICD-10-CM | POA: Diagnosis not present

## 2014-10-29 DIAGNOSIS — H35033 Hypertensive retinopathy, bilateral: Secondary | ICD-10-CM | POA: Diagnosis not present

## 2014-10-29 DIAGNOSIS — G453 Amaurosis fugax: Secondary | ICD-10-CM

## 2014-10-30 ENCOUNTER — Other Ambulatory Visit (HOSPITAL_COMMUNITY): Payer: Self-pay | Admitting: Internal Medicine

## 2014-10-30 DIAGNOSIS — E041 Nontoxic single thyroid nodule: Secondary | ICD-10-CM

## 2014-11-02 ENCOUNTER — Ambulatory Visit (HOSPITAL_COMMUNITY)
Admission: RE | Admit: 2014-11-02 | Discharge: 2014-11-02 | Disposition: A | Discharge status: Home / Self Care | Payer: Medicare Other | Source: Ambulatory Visit | Attending: Internal Medicine | Admitting: Internal Medicine

## 2014-11-02 DIAGNOSIS — E041 Nontoxic single thyroid nodule: Secondary | ICD-10-CM

## 2014-11-02 DIAGNOSIS — E042 Nontoxic multinodular goiter: Secondary | ICD-10-CM | POA: Diagnosis not present

## 2014-11-06 ENCOUNTER — Other Ambulatory Visit (HOSPITAL_COMMUNITY): Payer: Self-pay | Admitting: Internal Medicine

## 2014-11-06 DIAGNOSIS — E041 Nontoxic single thyroid nodule: Secondary | ICD-10-CM

## 2014-11-12 ENCOUNTER — Encounter (HOSPITAL_COMMUNITY): Payer: Self-pay

## 2014-11-12 ENCOUNTER — Ambulatory Visit (HOSPITAL_COMMUNITY)
Admission: RE | Admit: 2014-11-12 | Discharge: 2014-11-12 | Disposition: A | Discharge status: Home / Self Care | Payer: Medicare Other | Source: Ambulatory Visit | Attending: Internal Medicine | Admitting: Internal Medicine

## 2014-11-12 DIAGNOSIS — E041 Nontoxic single thyroid nodule: Secondary | ICD-10-CM | POA: Diagnosis not present

## 2014-11-12 DIAGNOSIS — E079 Disorder of thyroid, unspecified: Secondary | ICD-10-CM | POA: Diagnosis not present

## 2014-11-12 MED ORDER — LIDOCAINE HCL (PF) 2 % IJ SOLN
INTRAMUSCULAR | Status: AC
  Administered 2014-11-12: 10 mL
  Filled 2014-11-12: qty 10

## 2014-11-12 MED ORDER — LIDOCAINE HCL (PF) 2 % IJ SOLN: 10.0000 mL | Freq: Once | INTRAMUSCULAR | Status: AC

## 2014-11-12 NOTE — Discharge Instructions (Signed)
Thyroid Biopsy °The thyroid gland is a butterfly-shaped gland situated in the front of the neck. It produces hormones which affect metabolism, growth and development, and body temperature. A thyroid biopsy is a procedure in which small samples of tissue or fluid are removed from the thyroid gland or mass and examined under a microscope. This test is done to determine the cause of thyroid problems, such as infection, cancer, or other thyroid problems. °There are 2 ways to obtain samples: °1. Fine needle biopsy. Samples are removed using a thin needle inserted through the skin and into the thyroid gland or mass. °2. Open biopsy. Samples are removed after a cut (incision) is made through the skin. °LET YOUR CAREGIVER KNOW ABOUT:  °· Allergies. °· Medications taken including herbs, eye drops, over-the-counter medications, and creams. °· Use of steroids (by mouth or creams). °· Previous problems with anesthetics or numbing medicine. °· Possibility of pregnancy, if this applies. °· History of blood clots (thrombophlebitis). °· History of bleeding or blood problems. °· Previous surgery. °· Other health problems. °RISKS AND COMPLICATIONS °· Bleeding from the site. The risk of bleeding is higher if you have a bleeding disorder or are taking any blood thinning medications (anticoagulants). °· Infection. °· Injury to structures near the thyroid gland. °BEFORE THE PROCEDURE  °This is a procedure that can be done as an outpatient. Confirm the time that you need to arrive for your procedure. Confirm whether there is a need to fast or withhold any medications. A blood sample may be done to determine your blood clotting time. Medicine may be given to help you relax (sedative). °PROCEDURE °Fine needle biopsy. °You will be awake during the procedure. You may be asked to lie on your back with your head tipped backward to extend your neck. Let your caregiver know if you cannot tolerate the positioning. An area on your neck will be  cleansed. A needle is inserted through the skin of your neck. You may feel a mild discomfort during this procedure. You may be asked to avoid coughing, talking, swallowing, or making sounds during some portions of the procedure. The needle is withdrawn once tissue or fluid samples have been removed. Pressure may be applied to the neck to reduce swelling and ensure that bleeding has stopped. The samples will be sent for examination.  °Open biopsy. °You will be given general anesthesia. You will be asleep during the procedure. An incision is made in your neck. A sample of thyroid tissue or the mass is removed. The tissue sample or mass will be sent for examination. The sample or mass may be examined during the biopsy. If the sample or mass contains cancer cells, some or all of the thyroid gland may be removed. The incision is closed with stitches. °AFTER THE PROCEDURE  °Your recovery will be assessed and monitored. If there are no problems, as an outpatient, you should be able to go home shortly after the procedure. °If you had a fine needle biopsy: °· You may have soreness at the biopsy site for 1 to 2 days. °If you had an open biopsy:  °· You may have soreness at the biopsy site for 3 to 4 days. °· You may have a hoarse voice or sore throat for 1 to 2 days. °Obtaining the Test Results °It is your responsibility to obtain your test results. Do not assume everything is normal if you have not heard from your caregiver or the medical facility. It is important for you to follow up   on all of your test results. °HOME CARE INSTRUCTIONS  °· Keeping your head raised on a pillow when you are lying down may ease biopsy site discomfort. °· Supporting the back of your head and neck with both hands as you sit up from a lying position may ease biopsy site discomfort. °· Only take over-the-counter or prescription medicines for pain, discomfort, or fever as directed by your caregiver. °· Throat lozenges or gargling with warm salt  water may help to soothe a sore throat. °SEEK IMMEDIATE MEDICAL CARE IF:  °· You have severe bleeding from the biopsy site. °· You have difficulty swallowing. °· You have a fever. °· You have increased pain, swelling, redness, or warmth at the biopsy site. °· You notice pus coming from the biopsy site. °· You have swollen glands (lymph nodes) in your neck. °Document Released: 10/08/2007 Document Revised: 04/07/2013 Document Reviewed: 03/05/2014 °ExitCare® Patient Information ©2015 [employer]. This information is not intended to replace advice given to you by your health care provider. Make sure you discuss any questions you have with your health care provider. ° °

## 2014-11-24 DIAGNOSIS — R490 Dysphonia: Secondary | ICD-10-CM | POA: Diagnosis not present

## 2014-11-24 DIAGNOSIS — D440 Neoplasm of uncertain behavior of thyroid gland: Secondary | ICD-10-CM | POA: Diagnosis not present

## 2014-11-25 ENCOUNTER — Other Ambulatory Visit: Payer: Self-pay | Admitting: Otolaryngology

## 2014-12-09 DIAGNOSIS — M4302 Spondylolysis, cervical region: Secondary | ICD-10-CM | POA: Diagnosis not present

## 2014-12-09 DIAGNOSIS — M47816 Spondylosis without myelopathy or radiculopathy, lumbar region: Secondary | ICD-10-CM | POA: Diagnosis not present

## 2014-12-25 HISTORY — PX: CATARACT EXTRACTION W/ INTRAOCULAR LENS IMPLANT: SHX1309

## 2015-01-04 DIAGNOSIS — H9190 Unspecified hearing loss, unspecified ear: Secondary | ICD-10-CM | POA: Diagnosis not present

## 2015-01-04 DIAGNOSIS — G43109 Migraine with aura, not intractable, without status migrainosus: Secondary | ICD-10-CM | POA: Diagnosis not present

## 2015-01-04 DIAGNOSIS — D696 Thrombocytopenia, unspecified: Secondary | ICD-10-CM | POA: Diagnosis not present

## 2015-01-04 DIAGNOSIS — Z79899 Other long term (current) drug therapy: Secondary | ICD-10-CM | POA: Diagnosis not present

## 2015-01-11 DIAGNOSIS — Z0001 Encounter for general adult medical examination with abnormal findings: Secondary | ICD-10-CM | POA: Diagnosis not present

## 2015-01-12 ENCOUNTER — Encounter (HOSPITAL_COMMUNITY): Payer: Self-pay

## 2015-01-12 ENCOUNTER — Encounter (HOSPITAL_COMMUNITY)
Admission: RE | Admit: 2015-01-12 | Discharge: 2015-01-12 | Disposition: A | Discharge status: Home / Self Care | Payer: Medicare Other | Source: Ambulatory Visit | Attending: Otolaryngology | Admitting: Otolaryngology

## 2015-01-12 ENCOUNTER — Ambulatory Visit (HOSPITAL_COMMUNITY)
Admission: RE | Admit: 2015-01-12 | Discharge: 2015-01-12 | Disposition: A | Discharge status: Home / Self Care | Payer: Medicare Other | Source: Ambulatory Visit | Attending: Anesthesiology | Admitting: Anesthesiology

## 2015-01-12 DIAGNOSIS — R0600 Dyspnea, unspecified: Secondary | ICD-10-CM | POA: Insufficient documentation

## 2015-01-12 DIAGNOSIS — Z01818 Encounter for other preprocedural examination: Secondary | ICD-10-CM | POA: Insufficient documentation

## 2015-01-12 DIAGNOSIS — R05 Cough: Secondary | ICD-10-CM | POA: Insufficient documentation

## 2015-01-12 DIAGNOSIS — R509 Fever, unspecified: Secondary | ICD-10-CM | POA: Insufficient documentation

## 2015-01-12 DIAGNOSIS — J984 Other disorders of lung: Secondary | ICD-10-CM | POA: Diagnosis not present

## 2015-01-12 DIAGNOSIS — Z01811 Encounter for preprocedural respiratory examination: Secondary | ICD-10-CM

## 2015-01-12 HISTORY — DX: Frequency of micturition: R35.0

## 2015-01-12 HISTORY — DX: Pneumonia, unspecified organism: J18.9

## 2015-01-12 HISTORY — DX: Benign prostatic hyperplasia with lower urinary tract symptoms: N40.1

## 2015-01-12 LAB — BASIC METABOLIC PANEL
Anion gap: 5 (ref 5–15)
BUN: 20 mg/dL (ref 6–23)
CO2: 29 mmol/L (ref 19–32)
Calcium: 9.4 mg/dL (ref 8.4–10.5)
Chloride: 107 mEq/L (ref 96–112)
Creatinine, Ser: 1.43 mg/dL — ABNORMAL HIGH (ref 0.50–1.35)
GFR calc Af Amer: 54 mL/min — ABNORMAL LOW (ref >90)
GFR calc non Af Amer: 47 mL/min — ABNORMAL LOW (ref >90)
Glucose, Bld: 90 mg/dL (ref 70–99)
Potassium: 4.4 mmol/L (ref 3.5–5.1)
Sodium: 141 mmol/L (ref 135–145)

## 2015-01-12 LAB — CBC
HCT: 42.6 % (ref 39.0–52.0)
Hemoglobin: 13.6 g/dL (ref 13.0–17.0)
MCH: 30.7 pg (ref 26.0–34.0)
MCHC: 31.9 g/dL (ref 30.0–36.0)
MCV: 96.2 fL (ref 78.0–100.0)
Platelets: 189 10*3/uL (ref 150–400)
RBC: 4.43 MIL/uL (ref 4.22–5.81)
RDW: 13.3 % (ref 11.5–15.5)
WBC: 4.7 10*3/uL (ref 4.0–10.5)

## 2015-01-12 NOTE — Pre-Procedure Instructions (Signed)
Kyle Owen  01/12/2015   Your procedure is scheduled on:  Wednesday, January 27  Report to [facility] Admitting at 0800 AM.  Call this number if you have problems the morning of surgery: [phone].              Please call the Preadmission department  [phone] for questions prior to your surgery date.   Remember:   Do not eat food or drink liquids after midnight.Tuesday night.    Take these medicines the morning of surgery with A SIP OF WATER: finesteride (Proscar)                                                      Omeprazole(Prilosec), sertraline(Zoloft), verapamil(Calan-SR)   Do not wear jewelry.  Do not wear lotions, powders, or perfumes. You may not wear deodorant.  Do not shave 48 hours prior to surgery. Men may shave face and neck.  Do not bring valuables to the hospital.  [facility] is not responsible  for any belongings or valuables.               Contacts, dentures or bridgework may not be worn into surgery.  Leave suitcase in the car. After surgery it may be brought to your room.  For patients admitted to the hospital, discharge time is determined by your  treatment team.                 Special Instructions: [place] - Preparing for Surgery  Before surgery, you can play an important role.  Because skin is not sterile, your skin needs to be as free of germs as possible.  You can reduce the number of germs on you skin by washing with CHG (chlorahexidine gluconate) soap before surgery.  CHG is an antiseptic cleaner which kills germs and bonds with the skin to continue killing germs even after washing.  Please DO NOT use if you have an allergy to CHG or antibacterial soaps.  If your skin becomes reddened/irritated stop using the CHG and inform your nurse when you arrive at Short Stay.  Do not shave (including legs and underarms) for at least 48 hours prior to the first CHG shower.  You may shave your face.  Please follow these instructions  carefully:   1.  Shower with CHG Soap the night before surgery and the  morning of Surgery.  2.  If you choose to wash your hair, wash your hair first as usual with your   normal shampoo.  3.  After you shampoo, rinse your hair and body thoroughly to remove the  Shampoo.  4.  Use CHG as you would any other liquid soap.  You can apply chg directly   to the skin and wash gently with scrungie or a clean washcloth.  5.  Apply the CHG Soap to your body ONLY FROM THE NECK DOWN. Do not use on open wounds or open sores.  Avoid contact with your eyes,  ears, mouth and genitals (private parts).  Wash genitals (private parts)   with your normal soap.  6.  Wash thoroughly, paying special attention to the area where your surgery  will be performed.  7.  Thoroughly rinse your body with warm water from the neck down.  8.  DO NOT shower/wash with your normal  soap after using and rinsing off   the CHG Soap.  9.  Pat yourself dry with a clean towel.            10.  Wear clean pajamas.            11.  Place clean sheets on your bed the night of your first shower and do not  sleep with pets.  Day of Surgery  Do not apply any lotions/deoderants the morning of surgery.  Please wear clean clothes to the hospital/surgery center.     Please read over the following fact sheets that you were given: Pain Booklet, Coughing and Deep Breathing and Surgical Site Infection Prevention

## 2015-01-19 NOTE — Anesthesia Preprocedure Evaluation (Addendum)
Anesthesia Evaluation  Patient identified by MRN, date of birth, ID band Patient awake    Reviewed: Allergy & Precautions, NPO status , [name] Chart, lab work & pertinent test results, reviewed documented beta blocker date and time   Airway Mallampati: II   Neck ROM: Full    Dental  (+) Teeth Intact, Dental Advisory Given   Pulmonary COPDformer smoker (45 pack year hx),  breath sounds clear to auscultation        Cardiovascular Rhythm:Regular  ECHO 2014 EF 60%   Neuro/Psych    GI/Hepatic Neg liver ROS, GERD-  Medicated,  Endo/Other  negative endocrine ROS  Renal/GU GFR 60     Musculoskeletal   Abdominal (+)  Abdomen: soft.    Peds  Hematology 13/43 H/H   Anesthesia Other Findings   Reproductive/Obstetrics                           Anesthesia Physical Anesthesia Plan  ASA: III  Anesthesia Plan: General   Post-op Pain Management:    Induction: Intravenous  Airway Management Planned: Oral ETT  Additional Equipment:   Intra-op Plan:   Post-operative Plan: Extubation in OR  Informed Consent: I have reviewed the patients History and Physical, chart, labs and discussed the procedure including the risks, benefits and alternatives for the proposed anesthesia with the patient or authorized representative who has indicated his/her understanding and acceptance.     Plan Discussed with:   Anesthesia Plan Comments:         Anesthesia Quick Evaluation

## 2015-01-20 ENCOUNTER — Encounter (HOSPITAL_COMMUNITY)
Admission: RE | Disposition: A | Discharge status: Home / Self Care | Payer: Self-pay | Source: Ambulatory Visit | Attending: Otolaryngology

## 2015-01-20 ENCOUNTER — Ambulatory Visit (HOSPITAL_COMMUNITY)
Admission: RE | Admit: 2015-01-20 | Discharge: 2015-01-21 | Disposition: A | Discharge status: Home / Self Care | Payer: Medicare Other | Source: Ambulatory Visit | Attending: Otolaryngology | Admitting: Otolaryngology

## 2015-01-20 ENCOUNTER — Ambulatory Visit (HOSPITAL_COMMUNITY): Payer: Medicare Other | Admitting: Anesthesiology

## 2015-01-20 ENCOUNTER — Encounter (HOSPITAL_COMMUNITY): Payer: Self-pay | Admitting: General Practice

## 2015-01-20 DIAGNOSIS — D440 Neoplasm of uncertain behavior of thyroid gland: Secondary | ICD-10-CM | POA: Diagnosis not present

## 2015-01-20 DIAGNOSIS — R490 Dysphonia: Secondary | ICD-10-CM | POA: Insufficient documentation

## 2015-01-20 DIAGNOSIS — J449 Chronic obstructive pulmonary disease, unspecified: Secondary | ICD-10-CM | POA: Insufficient documentation

## 2015-01-20 DIAGNOSIS — E042 Nontoxic multinodular goiter: Secondary | ICD-10-CM | POA: Diagnosis not present

## 2015-01-20 DIAGNOSIS — D497 Neoplasm of unspecified behavior of endocrine glands and other parts of nervous system: Secondary | ICD-10-CM | POA: Diagnosis not present

## 2015-01-20 DIAGNOSIS — Z888 Allergy status to other drugs, medicaments and biological substances status: Secondary | ICD-10-CM | POA: Diagnosis not present

## 2015-01-20 DIAGNOSIS — K219 Gastro-esophageal reflux disease without esophagitis: Secondary | ICD-10-CM | POA: Diagnosis not present

## 2015-01-20 DIAGNOSIS — Z9889 Other specified postprocedural states: Secondary | ICD-10-CM

## 2015-01-20 DIAGNOSIS — D34 Benign neoplasm of thyroid gland: Secondary | ICD-10-CM | POA: Diagnosis not present

## 2015-01-20 DIAGNOSIS — M1990 Unspecified osteoarthritis, unspecified site: Secondary | ICD-10-CM | POA: Diagnosis not present

## 2015-01-20 DIAGNOSIS — Z87891 Personal history of nicotine dependence: Secondary | ICD-10-CM | POA: Diagnosis not present

## 2015-01-20 DIAGNOSIS — E890 Postprocedural hypothyroidism: Secondary | ICD-10-CM

## 2015-01-20 HISTORY — PX: THYROIDECTOMY: SHX17

## 2015-01-20 SURGERY — THYROIDECTOMY
Anesthesia: General | Site: Neck | Laterality: Right

## 2015-01-20 MED ORDER — FENTANYL CITRATE 0.05 MG/ML IJ SOLN
INTRAMUSCULAR | Status: DC | PRN
  Administered 2015-01-20: 50 ug via INTRAVENOUS
  Administered 2015-01-20: 100 ug via INTRAVENOUS
  Administered 2015-01-20: 50 ug via INTRAVENOUS

## 2015-01-20 MED ORDER — SODIUM CHLORIDE 0.9 % IJ SOLN
INTRAMUSCULAR | Status: AC
  Filled 2015-01-20: qty 10

## 2015-01-20 MED ORDER — FINASTERIDE 5 MG PO TABS
5.0000 mg | ORAL_TABLET | Freq: Every day | ORAL | Status: DC
  Administered 2015-01-21: 5 mg via ORAL
  Filled 2015-01-20: qty 1

## 2015-01-20 MED ORDER — PANTOPRAZOLE SODIUM 40 MG PO TBEC
40.0000 mg | DELAYED_RELEASE_TABLET | Freq: Every day | ORAL | Status: DC
  Administered 2015-01-21: 40 mg via ORAL
  Filled 2015-01-20: qty 1

## 2015-01-20 MED ORDER — LIDOCAINE-EPINEPHRINE 1 %-1:100000 IJ SOLN
INTRAMUSCULAR | Status: AC
  Filled 2015-01-20: qty 1

## 2015-01-20 MED ORDER — LIDOCAINE-EPINEPHRINE 1 %-1:100000 IJ SOLN
INTRAMUSCULAR | Status: DC | PRN
  Administered 2015-01-20: 20 mL

## 2015-01-20 MED ORDER — ALBUTEROL SULFATE HFA 108 (90 BASE) MCG/ACT IN AERS: 2.0000 | INHALATION_SPRAY | RESPIRATORY_TRACT | Status: DC | PRN

## 2015-01-20 MED ORDER — LIDOCAINE HCL 4 % MT SOLN
OROMUCOSAL | Status: DC | PRN
  Administered 2015-01-20: 4 mL via TOPICAL

## 2015-01-20 MED ORDER — PROMETHAZINE HCL 25 MG/ML IJ SOLN: 6.2500 mg | INTRAMUSCULAR | Status: DC | PRN

## 2015-01-20 MED ORDER — FENTANYL CITRATE 0.05 MG/ML IJ SOLN: 25.0000 ug | INTRAMUSCULAR | Status: DC | PRN

## 2015-01-20 MED ORDER — PROMETHAZINE HCL 25 MG RE SUPP: 25.0000 mg | Freq: Four times a day (QID) | RECTAL | Status: DC | PRN

## 2015-01-20 MED ORDER — LIDOCAINE HCL (CARDIAC) 20 MG/ML IV SOLN
INTRAVENOUS | Status: AC
  Filled 2015-01-20: qty 5

## 2015-01-20 MED ORDER — KCL IN DEXTROSE-NACL 20-5-0.45 MEQ/L-%-% IV SOLN
INTRAVENOUS | Status: DC
  Administered 2015-01-20: 15:00:00 via INTRAVENOUS
  Filled 2015-01-20 (×3): qty 1000

## 2015-01-20 MED ORDER — 0.9 % SODIUM CHLORIDE (POUR BTL) OPTIME
TOPICAL | Status: DC | PRN
  Administered 2015-01-20: 1000 mL

## 2015-01-20 MED ORDER — ROCURONIUM BROMIDE 50 MG/5ML IV SOLN
INTRAVENOUS | Status: AC
  Filled 2015-01-20: qty 1

## 2015-01-20 MED ORDER — GLYCOPYRROLATE 0.2 MG/ML IJ SOLN
INTRAMUSCULAR | Status: AC
  Filled 2015-01-20: qty 1

## 2015-01-20 MED ORDER — PROMETHAZINE HCL 25 MG PO TABS: 25.0000 mg | ORAL_TABLET | Freq: Four times a day (QID) | ORAL | Status: DC | PRN

## 2015-01-20 MED ORDER — PHENYLEPHRINE HCL 10 MG/ML IJ SOLN
10.0000 mg | INTRAVENOUS | Status: DC | PRN
  Administered 2015-01-20: 10 ug/min via INTRAVENOUS

## 2015-01-20 MED ORDER — MEPERIDINE HCL 25 MG/ML IJ SOLN: 6.2500 mg | INTRAMUSCULAR | Status: DC | PRN

## 2015-01-20 MED ORDER — FENTANYL CITRATE 0.05 MG/ML IJ SOLN
INTRAMUSCULAR | Status: AC
  Filled 2015-01-20: qty 5

## 2015-01-20 MED ORDER — OXYCODONE-ACETAMINOPHEN 5-325 MG PO TABS
1.0000 | ORAL_TABLET | ORAL | Status: DC | PRN
  Administered 2015-01-20 – 2015-01-21 (×2): 1 via ORAL
  Filled 2015-01-20: qty 1
  Filled 2015-01-20: qty 2

## 2015-01-20 MED ORDER — ARTIFICIAL TEARS OP OINT
TOPICAL_OINTMENT | OPHTHALMIC | Status: AC
  Filled 2015-01-20: qty 3.5

## 2015-01-20 MED ORDER — LACTATED RINGERS IV SOLN
INTRAVENOUS | Status: DC | PRN
  Administered 2015-01-20 (×2): via INTRAVENOUS

## 2015-01-20 MED ORDER — LACTATED RINGERS IV SOLN
INTRAVENOUS | Status: DC
  Administered 2015-01-20: 50 mL/h via INTRAVENOUS

## 2015-01-20 MED ORDER — SERTRALINE HCL 50 MG PO TABS
50.0000 mg | ORAL_TABLET | Freq: Every day | ORAL | Status: DC
  Administered 2015-01-21: 50 mg via ORAL
  Filled 2015-01-20: qty 1

## 2015-01-20 MED ORDER — VERAPAMIL HCL ER 240 MG PO TBCR
240.0000 mg | EXTENDED_RELEASE_TABLET | Freq: Every day | ORAL | Status: DC
  Administered 2015-01-21: 240 mg via ORAL
  Filled 2015-01-20: qty 1

## 2015-01-20 MED ORDER — GLYCOPYRROLATE 0.2 MG/ML IJ SOLN
INTRAMUSCULAR | Status: DC | PRN
  Administered 2015-01-20: 0.1 mg via INTRAVENOUS

## 2015-01-20 MED ORDER — SUCCINYLCHOLINE CHLORIDE 20 MG/ML IJ SOLN
INTRAMUSCULAR | Status: AC
  Filled 2015-01-20: qty 1

## 2015-01-20 MED ORDER — EPHEDRINE SULFATE 50 MG/ML IJ SOLN
INTRAMUSCULAR | Status: AC
Start: 2015-01-20 — End: 2015-01-20
  Filled 2015-01-20: qty 1

## 2015-01-20 MED ORDER — BUDESONIDE-FORMOTEROL FUMARATE 160-4.5 MCG/ACT IN AERO
2.0000 | INHALATION_SPRAY | Freq: Two times a day (BID) | RESPIRATORY_TRACT | Status: DC
  Administered 2015-01-21: 2 via RESPIRATORY_TRACT
  Filled 2015-01-20 (×2): qty 6

## 2015-01-20 MED ORDER — MORPHINE SULFATE 2 MG/ML IJ SOLN
2.0000 mg | INTRAMUSCULAR | Status: DC | PRN
  Administered 2015-01-20: 2 mg via INTRAVENOUS
  Filled 2015-01-20: qty 1

## 2015-01-20 MED ORDER — LIDOCAINE HCL (CARDIAC) 20 MG/ML IV SOLN
INTRAVENOUS | Status: DC | PRN
  Administered 2015-01-20: 80 mg via INTRAVENOUS

## 2015-01-20 MED ORDER — ONDANSETRON HCL 4 MG/2ML IJ SOLN
INTRAMUSCULAR | Status: DC | PRN
  Administered 2015-01-20: 4 mg via INTRAVENOUS

## 2015-01-20 MED ORDER — PROPOFOL 10 MG/ML IV BOLUS
INTRAVENOUS | Status: DC | PRN
  Administered 2015-01-20: 30 mg via INTRAVENOUS
  Administered 2015-01-20: 150 mg via INTRAVENOUS

## 2015-01-20 MED ORDER — SUCCINYLCHOLINE CHLORIDE 20 MG/ML IJ SOLN
INTRAMUSCULAR | Status: DC | PRN
  Administered 2015-01-20: 100 mg via INTRAVENOUS

## 2015-01-20 MED ORDER — PROPOFOL 10 MG/ML IV BOLUS
INTRAVENOUS | Status: AC
  Filled 2015-01-20: qty 20

## 2015-01-20 MED ORDER — CEFAZOLIN SODIUM-DEXTROSE 2-3 GM-% IV SOLR
INTRAVENOUS | Status: AC
  Filled 2015-01-20: qty 50

## 2015-01-20 MED ORDER — ONDANSETRON HCL 4 MG/2ML IJ SOLN
INTRAMUSCULAR | Status: AC
  Filled 2015-01-20: qty 2

## 2015-01-20 MED ORDER — CEFAZOLIN SODIUM-DEXTROSE 2-3 GM-% IV SOLR
INTRAVENOUS | Status: DC | PRN
  Administered 2015-01-20: 2 g via INTRAVENOUS

## 2015-01-20 SURGICAL SUPPLY — 53 items
APL SKNCLS STERI-STRIP NONHPOA (GAUZE/BANDAGES/DRESSINGS) ×1
ATTRACTOMAT 16X20 MAGNETIC DRP (DRAPES) ×2 IMPLANT
BENZOIN TINCTURE PRP APPL 2/3 (GAUZE/BANDAGES/DRESSINGS) ×2 IMPLANT
BLADE 10 SAFETY STRL DISP (BLADE) ×2 IMPLANT
BLADE SURG CLIPPER 3M 9600 (MISCELLANEOUS) IMPLANT
CANISTER SUCTION 2500CC (MISCELLANEOUS) ×2 IMPLANT
CLEANER TIP ELECTROSURG 2X2 (MISCELLANEOUS) ×2 IMPLANT
CLIP TI WIDE RED SMALL 24 (CLIP) ×1 IMPLANT
CONT SPEC 4OZ CLIKSEAL STRL BL (MISCELLANEOUS) IMPLANT
CORDS BIPOLAR (ELECTRODE) ×2 IMPLANT
COVER SURGICAL LIGHT HANDLE (MISCELLANEOUS) ×2 IMPLANT
CRADLE DONUT ADULT HEAD (MISCELLANEOUS) ×2 IMPLANT
DRAIN CHANNEL 10F 3/8 F FF (DRAIN) ×2 IMPLANT
ELECT COATED BLADE 2.86 ST (ELECTRODE) ×2 IMPLANT
ELECT REM PT RETURN 9FT ADLT (ELECTROSURGICAL) ×2
ELECTRODE REM PT RTRN 9FT ADLT (ELECTROSURGICAL) ×1 IMPLANT
EVACUATOR SILICONE 100CC (DRAIN) ×2 IMPLANT
GAUZE SPONGE 4X4 16PLY XRAY LF (GAUZE/BANDAGES/DRESSINGS) ×1 IMPLANT
GLOVE BIO SURGEON STRL SZ 6.5 (GLOVE) ×2 IMPLANT
GLOVE BIO SURGEON STRL SZ7.5 (GLOVE) ×1 IMPLANT
GLOVE BIOGEL PI IND STRL 6.5 (GLOVE) ×1 IMPLANT
GLOVE BIOGEL PI IND STRL 7.0 (GLOVE) IMPLANT
GLOVE BIOGEL PI INDICATOR 6.5 (GLOVE) ×1
GLOVE BIOGEL PI INDICATOR 7.0 (GLOVE) ×1
GLOVE ECLIPSE 7.5 STRL STRAW (GLOVE) ×2 IMPLANT
GLOVE SURG SS PI 7.0 STRL IVOR (GLOVE) ×1 IMPLANT
GOWN STRL REUS W/ TWL LRG LVL3 (GOWN DISPOSABLE) ×3 IMPLANT
GOWN STRL REUS W/TWL LRG LVL3 (GOWN DISPOSABLE) ×6
HEMOSTAT SURGICEL 2X14 (HEMOSTASIS) IMPLANT
KIT BASIN OR (CUSTOM PROCEDURE TRAY) ×2 IMPLANT
KIT ROOM TURNOVER OR (KITS) ×2 IMPLANT
LIQUID BAND (GAUZE/BANDAGES/DRESSINGS) ×2 IMPLANT
NDL HYPO 25GX1X1/2 BEV (NEEDLE) IMPLANT
NEEDLE HYPO 25GX1X1/2 BEV (NEEDLE) ×2 IMPLANT
NS IRRIG 1000ML POUR BTL (IV SOLUTION) ×2 IMPLANT
PAD ARMBOARD 7.5X6 YLW CONV (MISCELLANEOUS) ×2 IMPLANT
PENCIL BUTTON HOLSTER BLD 10FT (ELECTRODE) ×2 IMPLANT
PROBE NERVBE PRASS .33 (MISCELLANEOUS) ×2 IMPLANT
SHEARS HARMONIC 9CM CVD (BLADE) ×2 IMPLANT
SPONGE INTESTINAL PEANUT (DISPOSABLE) ×2 IMPLANT
SUT ETHILON 2 0 FS 18 (SUTURE) ×2 IMPLANT
SUT PROLENE 6 0 CC 1 (SUTURE) IMPLANT
SUT SILK 2 0 FS (SUTURE) ×2 IMPLANT
SUT SILK 3 0 REEL (SUTURE) ×2 IMPLANT
SUT VICRYL 4-0 PS2 18IN ABS (SUTURE) ×3 IMPLANT
TAPE CLOTH 4X10 WHT NS (GAUZE/BANDAGES/DRESSINGS) ×2 IMPLANT
TOWEL OR 17X26 10 PK STRL BLUE (TOWEL DISPOSABLE) ×2 IMPLANT
TRAY ENT MC OR (CUSTOM PROCEDURE TRAY) ×2 IMPLANT
TRAY FOLEY CATH 14FRSI W/METER (CATHETERS) IMPLANT
TUBE ENDOTRAC EMG 7X10.2 (MISCELLANEOUS) ×1 IMPLANT
TUBE ENDOTRAC EMG 8X11.3 (MISCELLANEOUS) IMPLANT
TUBE ENDOTRACH  EMG 6MMTUBE EN (MISCELLANEOUS)
TUBE ENDOTRACH EMG 6MMTUBE EN (MISCELLANEOUS) IMPLANT

## 2015-01-20 NOTE — H&P (Signed)
Cc: Thyroid nodules  HPI: The patient is a 74 year old male who presents today with a new complaint of bilateral thyroid nodules.  According to the patient, he recently underwent a carotid ultrasound to evaluate his TIA.  The ultrasound noted bilateral thyroid nodules.  He subsequently underwent a thyroid ultrasound.  It showed a large 2.7 cm right thyroid nodule. A smaller 1.4 cm left thyroid nodule was also noted.  He subsequently underwent FNA biopsy of his dominant right thyroid nodule.  The result showed cellular atypia, with some Hrthle cell changes.  The patient presents reporting occasional hoarseness. It should be noted he has a history of laryngopharyngeal reflux, and is currently being treated with omeprazole.  He denies any significant dysphagia, odynophagia or dyspnea.  No other ENT, GI, or respiratory issue noted since the last visit.   Exam: General: Communicates without difficulty, well nourished, no acute distress.   Head: Normocephalic, no evidence injury, no tenderness, facial buttresses intact without stepoff.  Eyes: PERRL, EOMI.  No scleral icterus, conjunctivae clear.  Ears: External auditory canals clear bilaterally.  There is no edema or erythema.  Tympanic membrane is within normal limits bilaterally.   Nose: Normal skin and external support.  Anterior rhinoscopy reveals mildly congested pink mucosa over the septum and turbinates.  No lesions or polyps were seen.  Nasal septum: Rightward deviation but intact.  Oral cavity: Lips without lesions, oral mucosa moist, no masses or lesions seen.   Pharynx: Clear, no erythema.   Neck: Supple, full range of motion, no lymphadenopathy.   Salivary: Parotid and submandibular glands without mass.   Neuro:  CN 2-12 grossly intact. Gait normal.  Vestibular: No nystagmus at any point of gaze.   Procedure:  Flexible Fiberoptic Laryngoscopy -- Risks, benefits, and alternatives of flexible endoscopy were explained to the patient.  Specific mention  was made of the risk of throat numbness with difficulty swallowing, possible bleeding from the nose and mouth, and pain from the procedure.  The patient gave oral consent to proceed.  The nasal cavities were decongested and anesthetised with a combination of oxymetazoline and 4% lidocaine solution.  The flexible scope was inserted into the right nasal cavity and advanced towards the nasopharynx.  Visualized mucosa over the turbinates and septum were congested.   Rightward septal deviation is noted.  The nasopharynx was clear.  Oropharyngeal walls were symmetric and mobile without lesion, mass, or edema.  Hypopharynx was also without  lesion or edema.  Larynx was mobile without lesions.  No lesions or asymmetry in the supraglottic larynx.  Arytenoid mucosa was edematous with slight erythema.  True vocal folds were mobile, pale yellow and edematous but without mass or lesion.  Thick mucous was noted.   Base of tongue was within normal limits. The patient tolerated the procedure well.  Assessment 1.  Bilateral thyroid nodules.  His dominant right thyroid nodule measures 2.7 cm in diameter.  His FNA results are consistent with cellular atypia, with Hrthle cell changes.  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   2.  The patient continues to have moderate posterior laryngeal edema on today's laryngoscopy evaluation. This is likely a result of his laryngopharyngeal reflux.  His vocal cords are mobile bilaterally.   Plan 1.  The physical exam findings and FNA results are extensively discussed with the patient.  2.  The options of conservative observation versus right hemithyroidectomy surgery are discussed with the patient.  The pros and cons of the treatment options are discussed.  3.  The patient is interested in proceeding with the right hemithyroidectomy procedure.  We will schedule the procedure in accordance with the patient's schedule.  4.  The patient should continue with his daily  omeprazole.

## 2015-01-20 NOTE — Anesthesia Procedure Notes (Addendum)
Procedure Name: Intubation Date/Time: 01/20/2015 9:51 AM Performed by: Izora Gala Pre-anesthesia Checklist: Patient identified, Emergency Drugs available, Suction available and Patient being monitored Patient Re-evaluated:Patient Re-evaluated prior to inductionOxygen Delivery Method: Circle system utilized Preoxygenation: Pre-oxygenation with 100% oxygen Intubation Type: IV induction Ventilation: Mask ventilation without difficulty Laryngoscope Size: Miller and 3 Grade View: Grade II Tube type: Oral Tube size: 7.0 mm Number of attempts: 1 Airway Equipment and Method: Stylet and LTA kit utilized Placement Confirmation: ETT inserted through vocal cords under direct vision,  positive ETCO2 and breath sounds checked- equal and bilateral Secured at: 22 cm Tube secured with: Tape   NIMS tube

## 2015-01-20 NOTE — Brief Op Note (Signed)
01/20/2015  11:54 AM  PATIENT:  Kyle Owen  74 y.o. male  PRE-OPERATIVE DIAGNOSIS:  RIGHT THYROID MASS  POST-OPERATIVE DIAGNOSIS:  RIGHT THYROID MASS  PROCEDURE:  Procedure(s): RIGHT THYROIDECTOMY (Right)  SURGEON:  Surgeon(s) and Role:    * Ascencion Dike, MD - Primary  ASSISTANTS: Rometta Emery, PA-C   ANESTHESIA:   general  EBL:  Total I/O In: 1000 [I.V.:1000] Out: -   BLOOD ADMINISTERED:none  DRAINS: (#10) Jackson-Pratt drain(s) with closed bulb suction in the neck   LOCAL MEDICATIONS USED:  LIDOCAINE   SPECIMEN:  Source of Specimen:  Right thyroid lobe  DISPOSITION OF SPECIMEN:  PATHOLOGY  COUNTS:  YES  TOURNIQUET:  * No tourniquets in log *  DICTATION: .Other Dictation: Dictation Number [id number]  PLAN OF CARE: Admit for overnight observation  PATIENT DISPOSITION:  PACU - hemodynamically stable.   Delay start of Pharmacological VTE agent (>24hrs) due to surgical blood loss or risk of bleeding: no

## 2015-01-20 NOTE — Anesthesia Postprocedure Evaluation (Signed)
  Anesthesia Post-op Note  Patient: Kyle Owen  Procedure(s) Performed: Procedure(s): RIGHT THYROIDECTOMY (Right)  Patient Location: PACU  Anesthesia Type:General  Level of Consciousness: awake, alert  and oriented  Airway and Oxygen Therapy: Patient Spontanous Breathing and Patient connected to nasal cannula oxygen  Post-op Pain: mild  Post-op Assessment: Post-op Vital signs reviewed, [name] Cardiovascular Status Stable, Respiratory Function Stable and Patent Airway  Post-op Vital Signs: Reviewed and stable  Last Vitals:  Filed Vitals:   01/20/15 1245  BP:   Pulse: 64  Temp:   Resp: 11    Complications: No apparent anesthesia complications

## 2015-01-20 NOTE — Transfer of Care (Signed)
Immediate Anesthesia Transfer of Care Note  Patient: Kyle Owen  Procedure(s) Performed: Procedure(s): RIGHT THYROIDECTOMY (Right)  Patient Location: PACU  Anesthesia Type:General  Level of Consciousness: awake, sedated and patient cooperative  Airway & Oxygen Therapy: Patient Spontanous Breathing and Patient connected to nasal cannula oxygen  Post-op Assessment: Report given to PACU RN, Post -op Vital signs reviewed and stable and Patient moving all extremities  Post vital signs: Reviewed and stable  Complications: No apparent anesthesia complications

## 2015-01-21 ENCOUNTER — Encounter (HOSPITAL_COMMUNITY): Payer: Self-pay | Admitting: Otolaryngology

## 2015-01-21 DIAGNOSIS — K219 Gastro-esophageal reflux disease without esophagitis: Secondary | ICD-10-CM | POA: Diagnosis not present

## 2015-01-21 DIAGNOSIS — Z87891 Personal history of nicotine dependence: Secondary | ICD-10-CM | POA: Diagnosis not present

## 2015-01-21 DIAGNOSIS — R490 Dysphonia: Secondary | ICD-10-CM | POA: Diagnosis not present

## 2015-01-21 DIAGNOSIS — E042 Nontoxic multinodular goiter: Secondary | ICD-10-CM | POA: Diagnosis not present

## 2015-01-21 DIAGNOSIS — D34 Benign neoplasm of thyroid gland: Secondary | ICD-10-CM | POA: Diagnosis not present

## 2015-01-21 DIAGNOSIS — J449 Chronic obstructive pulmonary disease, unspecified: Secondary | ICD-10-CM | POA: Diagnosis not present

## 2015-01-21 MED ORDER — AMOXICILLIN 875 MG PO TABS: 875.0000 mg | ORAL_TABLET | Freq: Two times a day (BID) | ORAL | Status: DC

## 2015-01-21 MED ORDER — OXYCODONE-ACETAMINOPHEN 5-325 MG PO TABS: 1.0000 | ORAL_TABLET | ORAL | Status: DC | PRN

## 2015-01-21 NOTE — Op Note (Signed)
[name], Kyle Owen               ACCOUNT NO.:  [id number]  MEDICAL RECORD NO.:  [id number]  LOCATION:  6N32C                        FACILITY:  [facility]  PHYSICIAN:  Leta Baptist, MD            DATE OF BIRTH:  06/05/1941  DATE OF PROCEDURE:  01/20/2015 DATE OF DISCHARGE:                              OPERATIVE REPORT   SURGEON:  Leta Baptist, MD  PREOPERATIVE DIAGNOSIS:  Right thyroid nodule.  POSTOPERATIVE DIAGNOSIS:  Right thyroid nodule.  PROCEDURE PERFORMED:  Right hemithyroidectomy.  ANESTHESIA:  General endotracheal tube anesthesia.  COMPLICATIONS:  None.  ESTIMATED BLOOD LOSS:  Less than 20 mL.  INDICATION FOR PROCEDURE:  The patient is Kyle 74 year old male who was recently noted to have bilateral thyroid nodules on his carotid ultrasound.  The ultrasound showed Kyle large 2.7 cm right thyroid nodule. Kyle smaller 1.4 cm left thyroid nodule was also noted.  He subsequently underwent fine-needle aspiration biopsy of his dominant right thyroid nodule.  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  Based on the above findings, the decision was made for the patient to undergo right hemithyroidectomy surgery.  The risks, benefits, alternatives, and details of the procedure were discussed with the patient.  Questions were invited and answered.  Informed consent was obtained.  DESCRIPTION:  The patient was taken to the operating room and placed supine on the operating table.  General endotracheal tube anesthesia was administered by the anesthesiologist.  Preop IV antibiotic was given. The patient was positioned and prepped and draped in Kyle standard fashion for thyroidectomy surgery.  1% lidocaine with 1:100,000 epinephrine was infiltrated at the site of incision.  The NIMS endotracheal tube was used.  The recurrent laryngeal nerve monitoring system was functional throughout the case.  Kyle standard lower neck transverse incision was made.  The incision was carried down to  the level of the platysma muscles.  Superior and inferiorly-based subplatysmal flaps were elevated in Kyle standard fashion. The strap muscles were divided at midline and retracted laterally, exposing the thyroid gland.  Multiple nodules were noted within the right thyroid lobe.  The right thyroid lobe was carefully dissected free from the surrounding soft tissue.  The superior pole vessels were suture ligated.  The right recurrent laryngeal nerve was identified and preserved.  The entire lobe was then removed and sent to the Pathology Department for frozen section analysis.  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  The surgical site was copiously irrigated.  No other abnormality was noted.  One parathyroid gland was identified and preserved.  Kyle #10 JP drain was placed.  The strap muscles were closed with 4-0 Vicryl sutures.  The incision was closed in layers with 4-0 Vicryl and Dermabond.  The care of the patient was turned over to the anesthesiologist.  The patient was awakened from anesthesia without difficulty.  He was extubated and transferred to the recovery room in good condition.  OPERATIVE FINDINGS:  The right thyroid lobe was removed without difficulty.  Multiple nodules were noted within the right thyroid lobe. The right recurrent laryngeal nerve was identified and preserved.  SPECIMEN:  Right thyroid lobe.  FOLLOWUP  CARE:  The patient will be observed overnight in the hospital. He will most likely be discharged home on postop day #1.     Leta Baptist, MD     ST/MEDQ  D:  01/20/2015  T:  01/21/2015  Job:  [id number]  cc:   Paula Compton. Willey Blade, MD

## 2015-01-21 NOTE — Discharge Instructions (Signed)
Thyroidectomy Care After Refer to this sheet in the next few weeks. These instructions provide you with general information on caring for yourself after you leave the hospital. Your caregiver also may give you specific instructions. Your treatment has been planned according to the most current medical practices available, but problems sometimes occur. Call your caregiver if you have any problems or questions after your procedure. HOME CARE INSTRUCTIONS   It is normal to be sore for a few weeks following surgery. See your caregiver if your pain seems to be getting worse rather than better.  You may resume a normal diet and activities as directed by your caregiver.  Avoid lifting weight greater than 20 lb (9 kg) or participating in heavy exercise or contact sports for 10 days or as instructed by your caregiver.  Make an appointment to see your caregiver for follow up. SEEK MEDICAL CARE IF:   You have increased bleeding from your wound.  You have redness, swelling, or increasing pain from your wound or in your neck.  There is pus coming from your wound.  You have an oral temperature above 102 F (38.9 C).  There is a bad smell coming from the wound or dressing.  You develop lightheadedness or feel faint.  You develop numbness, tingling, or muscle spasms in your arms, hands, feet, or face.  You have difficulty swallowing. SEEK IMMEDIATE MEDICAL CARE IF:   You develop a rash.  You have difficulty breathing.  You hear whistling noises that come from your chest.  You develop a cough that becomes increasingly worse.  You develop any reaction or side effects to medicines given.  There is swelling in your neck.  You develop changes in speech or hoarseness, which is getting worse. MAKE SURE YOU:   Understand these instructions.  Will watch your condition.  Will get help right away if you are not doing well or get worse. Document Released: 06/30/2005 Document Revised:  03/04/2012 Document Reviewed: 02/17/2011 [facility] Patient Information 2015 [place], [place]. This information is not intended to replace advice given to you by your health care provider. Make sure you discuss any questions you have with your health care provider.

## 2015-01-21 NOTE — Discharge Summary (Signed)
Physician Discharge Summary  Patient ID: Kyle Owen MRN: [id number] DOB/AGE: 75-Sep-1942 75 y.o.  Admit date: 01/20/2015 Discharge date: 01/21/2015  Admission Diagnoses: Right thyroid mass  Discharge Diagnoses: Right thyroid mass Active Problems:   S/P partial thyroidectomy   Discharged Condition: good  Hospital Course: Pt had an uneventful overnight stay. Pt tolerated po well. No bleeding. No stridor. Voice is strong.  Consults: None  Significant Diagnostic Studies: none  Treatments: surgery: Right hemithyroidectomy surgery  Discharge Exam: Blood pressure 141/62, pulse 68, temperature 98.4 F (36.9 C), temperature source Oral, resp. rate 17, height 6' (1.829 m), weight 223 lb 11.2 oz (101.47 kg), SpO2 97 %. Incision/Wound: c/d/i Strong voice  Disposition: 01-Home or Self Care  Discharge Instructions    Activity as tolerated - No restrictions    Complete by:  As directed      Diet general    Complete by:  As directed             Medication List    TAKE these medications        acetaminophen 500 MG tablet  Commonly known as:  TYLENOL  Take 500 mg by mouth every 6 (six) hours as needed.     amoxicillin 875 MG tablet  Commonly known as:  AMOXIL  Take 1 tablet (875 mg total) by mouth 2 (two) times daily.     budesonide-formoterol 160-4.5 MCG/ACT inhaler  Commonly known as:  SYMBICORT  Inhale 2 puffs into the lungs 2 (two) times daily.     finasteride 5 MG tablet  Commonly known as:  PROSCAR  Take 5 mg by mouth daily.     magnesium oxide 400 MG tablet  Commonly known as:  MAG-OX  Take 400 mg by mouth daily.     omeprazole 20 MG capsule  Commonly known as:  PRILOSEC  Take 1 capsule by mouth Daily.     oxyCODONE-acetaminophen 5-325 MG per tablet  Commonly known as:  PERCOCET/ROXICET  Take 1 tablet by mouth every 4 (four) hours as needed for moderate pain or severe pain.     sertraline 50 MG tablet  Commonly known as:  ZOLOFT  Take 50 mg by mouth  daily.     VENTOLIN HFA 108 (90 BASE) MCG/ACT inhaler  Generic drug:  albuterol  Inhale 2 puffs into the lungs Every 4 hours as needed. Breathing.     verapamil 240 MG CR tablet  Commonly known as:  CALAN-SR  Take 240 mg by mouth daily.           Follow-up Information    Follow up with Ascencion Dike, MD On 01/28/2015.   Specialty:  Otolaryngology   Why:  at [facility] information:   [address] [place] [address] [phone]       Signed: Ascencion Dike 01/21/2015, 8:37 AM

## 2015-01-21 NOTE — Discharge Planning (Signed)
Copy of AVS and RX to pt who verbalizes understanding.  Will dc amb to private car home with all personal belongings, with daughter.

## 2015-01-28 ENCOUNTER — Ambulatory Visit (INDEPENDENT_AMBULATORY_CARE_PROVIDER_SITE_OTHER): Payer: Medicare Other | Admitting: Otolaryngology

## 2015-04-19 DIAGNOSIS — J440 Chronic obstructive pulmonary disease with acute lower respiratory infection: Secondary | ICD-10-CM | POA: Diagnosis not present

## 2015-04-19 DIAGNOSIS — F3340 Major depressive disorder, recurrent, in remission, unspecified: Secondary | ICD-10-CM | POA: Diagnosis not present

## 2015-04-19 DIAGNOSIS — R269 Unspecified abnormalities of gait and mobility: Secondary | ICD-10-CM | POA: Diagnosis not present

## 2015-04-20 ENCOUNTER — Other Ambulatory Visit (HOSPITAL_COMMUNITY): Payer: Self-pay | Admitting: Internal Medicine

## 2015-04-20 ENCOUNTER — Ambulatory Visit (HOSPITAL_COMMUNITY)
Admission: RE | Admit: 2015-04-20 | Discharge: 2015-04-20 | Disposition: A | Discharge status: Home / Self Care | Payer: Medicare Other | Source: Ambulatory Visit | Attending: Internal Medicine | Admitting: Internal Medicine

## 2015-04-20 DIAGNOSIS — R42 Dizziness and giddiness: Secondary | ICD-10-CM | POA: Diagnosis not present

## 2015-04-20 DIAGNOSIS — R269 Unspecified abnormalities of gait and mobility: Secondary | ICD-10-CM

## 2015-04-20 DIAGNOSIS — R4182 Altered mental status, unspecified: Secondary | ICD-10-CM | POA: Diagnosis not present

## 2015-04-20 DIAGNOSIS — R531 Weakness: Secondary | ICD-10-CM | POA: Diagnosis not present

## 2015-04-26 ENCOUNTER — Other Ambulatory Visit (HOSPITAL_COMMUNITY): Payer: 59

## 2015-04-26 DIAGNOSIS — D692 Other nonthrombocytopenic purpura: Secondary | ICD-10-CM | POA: Diagnosis not present

## 2015-04-26 DIAGNOSIS — G463 Brain stem stroke syndrome: Secondary | ICD-10-CM | POA: Diagnosis not present

## 2015-04-29 ENCOUNTER — Ambulatory Visit (INDEPENDENT_AMBULATORY_CARE_PROVIDER_SITE_OTHER): Payer: Medicare Other | Admitting: Otolaryngology

## 2015-04-29 DIAGNOSIS — R490 Dysphonia: Secondary | ICD-10-CM

## 2015-05-28 ENCOUNTER — Ambulatory Visit: Payer: Medicare Other | Admitting: Diagnostic Neuroimaging

## 2015-06-03 DIAGNOSIS — Z981 Arthrodesis status: Secondary | ICD-10-CM | POA: Diagnosis not present

## 2015-06-03 DIAGNOSIS — M47816 Spondylosis without myelopathy or radiculopathy, lumbar region: Secondary | ICD-10-CM | POA: Diagnosis not present

## 2015-06-03 DIAGNOSIS — M4316 Spondylolisthesis, lumbar region: Secondary | ICD-10-CM | POA: Diagnosis not present

## 2015-06-03 DIAGNOSIS — M4326 Fusion of spine, lumbar region: Secondary | ICD-10-CM | POA: Diagnosis not present

## 2015-06-03 DIAGNOSIS — M5137 Other intervertebral disc degeneration, lumbosacral region: Secondary | ICD-10-CM | POA: Diagnosis not present

## 2015-06-03 DIAGNOSIS — M5136 Other intervertebral disc degeneration, lumbar region: Secondary | ICD-10-CM | POA: Diagnosis not present

## 2015-06-03 DIAGNOSIS — M4302 Spondylolysis, cervical region: Secondary | ICD-10-CM | POA: Diagnosis not present

## 2015-06-09 ENCOUNTER — Ambulatory Visit (INDEPENDENT_AMBULATORY_CARE_PROVIDER_SITE_OTHER): Payer: Medicare Other | Admitting: Diagnostic Neuroimaging

## 2015-06-09 ENCOUNTER — Other Ambulatory Visit: Payer: Self-pay | Admitting: *Deleted

## 2015-06-09 ENCOUNTER — Encounter: Payer: Self-pay | Admitting: Diagnostic Neuroimaging

## 2015-06-09 VITALS — BP 132/76 | HR 63 | Ht 73.0 in | Wt 210.6 lb

## 2015-06-09 DIAGNOSIS — Z79899 Other long term (current) drug therapy: Secondary | ICD-10-CM | POA: Diagnosis not present

## 2015-06-09 DIAGNOSIS — G453 Amaurosis fugax: Secondary | ICD-10-CM

## 2015-06-09 DIAGNOSIS — G450 Vertebro-basilar artery syndrome: Secondary | ICD-10-CM | POA: Diagnosis not present

## 2015-06-09 DIAGNOSIS — R42 Dizziness and giddiness: Secondary | ICD-10-CM

## 2015-06-09 NOTE — Patient Instructions (Signed)
I will check additional testing. 

## 2015-06-09 NOTE — Progress Notes (Signed)
[facility]  PATIENT: Kyle Owen DOB: February 28, 1941  REFERRING CLINICIAN: Asencion Noble  HISTORY FROM: patient  REASON FOR VISIT: new consult    HISTORICAL  CHIEF COMPLAINT:  Chief Complaint  Patient presents with  . New Patient    cerebellar lacunar unfarct, "recent stroke, more dizzy than normal"    HISTORY OF PRESENT ILLNESS:   74 year old right-handed male here for evaluation of balance difficulty, dizziness, possible stroke.  10/26/2014 patient had an episode where a "shade came over the right eye" and he lost vision completely for 15 minutes. Patient was about to go to the emergency room but then symptoms resolve. He saw his PCP the next day who ordered carotid ultrasound which was negative. Patient had no focal numbness, weakness, slurred speech, trouble talking, headache with that event.  04/09/2015, patient was at a sporting clay competition, and after the first day he was sleeping in his RV at night, then woke up with nausea and spinning sensation lasting for 10 minutes. Symptoms resolved. The next day patient was feeling back to normal. The next night after falling asleep, patient woke up with similar symptoms for 10 minutes. This happened a third night in a row. No symptoms during the daytime. No subsequent long-term abnormal symptoms following this. 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  No ongoing headache, numbness, tingling, focal weakness. Patient has had generalized lack of grip strength in the bilateral hands over the past 10-15 years. He has had chronic low back pain problems, status post surgery in 2014, as well as right knee pain and problems.    REVIEW OF SYSTEMS: Full 14 system review of systems performed and notable only for hearing loss spinning sensation moles shortness of breath loss of  vision easy bruising easy bleeding dizziness depression.  ALLERGIES: Allergies  Allergen Reactions  . Flomax [Tamsulosin Hcl]     Pt thought he was dying    HOME MEDICATIONS: Outpatient Prescriptions Prior to Visit  Medication Sig Dispense Refill  . acetaminophen (TYLENOL) 500 MG tablet Take 500 mg by mouth every 6 (six) hours as needed.    . budesonide-formoterol (SYMBICORT) 160-4.5 MCG/ACT inhaler Inhale 2 puffs into the lungs 2 (two) times daily.    . finasteride (PROSCAR) 5 MG tablet Take 5 mg by mouth daily.    . magnesium oxide (MAG-OX) 400 MG tablet Take 400 mg by mouth daily.    Marland Kitchen omeprazole (PRILOSEC) 20 MG capsule Take 1 capsule by mouth Daily.    . sertraline (ZOLOFT) 50 MG tablet Take 50 mg by mouth daily.      . VENTOLIN HFA 108 (90 BASE) MCG/ACT inhaler Inhale 2 puffs into the lungs Every 4 hours as needed. Breathing.    . verapamil (CALAN-SR) 240 MG CR tablet Take 240 mg by mouth daily.      Marland Kitchen amoxicillin (AMOXIL) 875 MG tablet Take 1 tablet (875 mg total) by mouth 2 (two) times daily. 10 tablet 0  . oxyCODONE-acetaminophen (PERCOCET/ROXICET) 5-325 MG per tablet Take 1 tablet by mouth every 4 (four) hours as needed for moderate pain or severe pain. 25 tablet 0   No facility-administered medications prior to visit.    PAST MEDICAL HISTORY: Past Medical History  Diagnosis Date  . Hearing impairment   . Migraines     takes verapamil   . Colon polyps   .  Arthritis   . Depression   . COPD (chronic obstructive pulmonary disease)   . Shortness of breath   . GERD (gastroesophageal reflux disease)   . DDD (degenerative disc disease), cervical   . DDD (degenerative disc disease), lumbar   . Right knee DJD   . Blurred vision, right eye     constricted blood vessel; takes verapamil with good results  . Benign prostatic hypertrophy with urinary frequency   . Pneumonia 2013  . Thyroid mass   . Colon polyp     PAST SURGICAL HISTORY: Past Surgical History  Procedure  Laterality Date  . Shoulder arthroscopy      left  . Cataract extraction      Right  . Elbow arthroscopy      left  . Colonoscopy  10/03/2011    Procedure: COLONOSCOPY;  Surgeon: Jamesetta So;  Location: [facility];  Service: Gastroenterology;  Laterality: N/A;  . Hernia repair      rih  . Cataract extraction w/phaco  12/16/2012    Procedure: CATARACT EXTRACTION PHACO AND INTRAOCULAR LENS PLACEMENT (IOC);  Surgeon: Tonny Branch, MD;  Location: [facility];  Service: Ophthalmology;  Laterality: Left;  CDE:17.31  . Knee arthroscopy Right 2015  . Back surgery    . Posterior lumbar fusion  2014  . Shoulder arthroscopy Left 2000  . Eye surgery    . Thyroid lobectomy Right 01/20/2015    dr Benjamine Mola  . Thyroidectomy Right 01/20/2015    Procedure: RIGHT THYROIDECTOMY;  Surgeon: Ascencion Dike, MD;  Location: [facility];  Service: ENT;  Laterality: Right;    FAMILY HISTORY: History reviewed. No pertinent family history.  SOCIAL HISTORY:  History   Social History  . Marital Status: Widowed    Spouse Name: N/A  . Number of Children: 3  . Years of Education: 14   Occupational History  . [employer]     retired   Social History Main Topics  . Smoking status: Former Smoker -- 1.00 packs/day for 45 years    Types: Cigarettes    Quit date: 09/26/2010  . Smokeless tobacco: Never Used  . Alcohol Use: No  . Drug Use: No  . Sexual Activity: No   Other Topics Concern  . Not on file   Social History Narrative   Widowed, lives with dog, [place] in [place], wife passed from Parkinson's disease 3 1/2 yrs ago   1 cup coffee daily     PHYSICAL EXAM  GENERAL EXAM/CONSTITUTIONAL: Vitals:  Filed Vitals:   06/09/15 0909  BP: 132/76  Pulse: 63  Height: 6\' 1"  (1.854 m)  Weight: 210 lb 9.6 oz (95.528 kg)     Body mass index is 27.79 kg/(m^2).  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  awake, alert, oriented to person, place and  time  recent and remote memory intact  normal attention and concentration  language fluent, comprehension intact, naming intact,   fund of knowledge appropriate  CRANIAL NERVE:   2nd - no papilledema on fundoscopic exam  2nd, 3rd, 4th, 6th - pupils equal and reactive to light, visual fields full to confrontation, extraocular muscles intact, no nystagmus  5th - facial sensation symmetric  7th - facial strength symmetric  8th - hearing intact  9th - palate elevates symmetrically, uvula midline  11th - shoulder shrug symmetric  12th - tongue protrusion midline  MOTOR:   normal bulk and tone, full strength in the BUE, BLE; EXCEPT ATROPHY OF BILATERAL INTRINSIC HAND MUSCLES  SENSORY:   normal and symmetric to light touch, temperature, vibration; EXCEPT DECR VIB IN RIGHT FOOT  COORDINATION:   finger-nose-finger, fine finger movements normal  REFLEXES:   deep tendon reflexes present and symmetric  GAIT/STATION:   narrow based gait; ANTALGIC, LIMPING, DUE TO KNEE AND BACK PAIN; CANNOT WALK TOE, HEEL OR TANDEM; ROMBERG POSITIVE    DIAGNOSTIC DATA (LABS, IMAGING, TESTING) - I reviewed patient records, labs, notes, testing and imaging myself where available.  Lab Results  Component Value Date   WBC 4.7 01/12/2015   HGB 13.6 01/12/2015     HCT 42.6 01/12/2015   MCV 96.2 01/12/2015   PLT 189 01/12/2015      Component Value Date/Time   NA 141 01/12/2015 1106   K 4.4 01/12/2015 1106   CL 107 01/12/2015 1106   CO2 29 01/12/2015 1106   GLUCOSE 90 01/12/2015 1106   BUN 20 01/12/2015 1106   CREATININE 1.43* 01/12/2015 1106   CALCIUM 9.4 01/12/2015 1106   GFRNONAA 47* 01/12/2015 1106   GFRAA 54* 01/12/2015 1106   No results found for: CHOL, HDL, LDLCALC, LDLDIRECT, TRIG, CHOLHDL No results found for: HGBA1C No results found for: VITAMINB12 No results found for: TSH   03/07/13 TTE  - Left ventricle: The cavity size was normal. Wall thickness was increased in a pattern of mild LVH. Systolic functionwas normal. The estimated ejection fraction was in therange of 55% to 60%. - Aortic valve: AV is thickened, calcified with minimallyrestricted motion Peak and mean gradients through thevalve are 11 and 40mm Hg respectively. Mild regurgitation. - Pulmonary arteries: PA peak pressure: 50mm Hg (S).  10/29/14 carotid u/s - No focal hemodynamically significant stenosis is noted. Incidental note is made of a right thyroid nodule. Full thyroid ultrasound may be helpful for further evaluation and to delineate any other thyroid nodules.  04/20/15 MRI brain [I reviewed images myself and agree with interpretation. Small right cerebellar linear lesion, possible chronic lacunar infarct vs cerebellar folia. -[facility]]  1.No acute intracranial abnormality. 2. Mild for age signal changes in the brain compatible with chronic small vessel disease.     ASSESSMENT AND PLAN  74 y.o. year old male here with episode of right amaurosis fugax in November 2015, and 3 episodes of nausea and vertigo in April 2016. 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 This combination of symptoms raises possibility for cardio embolic source. We'll check further workup.  Ddx: TIA / stroke / peripheral  vestibulopathy  PLAN: - MRA head/neck - TTE; if negative, may consider TEE and ambulatory cardiac monitor - check A1c, lipid panel - continue aspirin 81mg  daily - stroke risk factor reduction, signs and symptoms, education reviewed  Orders Placed This Encounter  Procedures  . MR MRA Headm  . MR Angiogram  Neck W Wo Contrast  . Hemoglobin A1c  . Lipid Panel  . ECHOCARDIOGRAM COMPLETE   Return in about 1 month (around 07/09/2015).  I reviewed images, labs, notes, records myself. I summarized findings and reviewed with patient, for this high risk condition (TIA/stroke) requiring high complexity decision making.     Penni Bombard, MD 6/00/4599, 77:41 AM Certified in Neurology, Neurophysiology and Neuroimaging  [facility] [address], [place], [place] [address] [phone]

## 2015-06-10 ENCOUNTER — Ambulatory Visit (HOSPITAL_COMMUNITY): Payer: Medicare Other | Attending: Diagnostic Neuroimaging

## 2015-06-10 ENCOUNTER — Other Ambulatory Visit: Payer: Self-pay

## 2015-06-10 DIAGNOSIS — G453 Amaurosis fugax: Secondary | ICD-10-CM | POA: Diagnosis not present

## 2015-06-10 DIAGNOSIS — G450 Vertebro-basilar artery syndrome: Secondary | ICD-10-CM | POA: Diagnosis not present

## 2015-06-10 DIAGNOSIS — G459 Transient cerebral ischemic attack, unspecified: Secondary | ICD-10-CM | POA: Diagnosis not present

## 2015-06-10 LAB — LIPID PANEL
Chol/HDL Ratio: 3.3 ratio units (ref 0.0–5.0)
Cholesterol, Total: 225 mg/dL — ABNORMAL HIGH (ref 100–199)
HDL: 68 mg/dL (ref >39)
LDL Calculated: 137 mg/dL — ABNORMAL HIGH (ref 0–99)
Triglycerides: 100 mg/dL (ref 0–149)
VLDL Cholesterol Cal: 20 mg/dL (ref 5–40)

## 2015-06-10 LAB — CREATININE, SERUM
Creatinine, Ser: 1.49 mg/dL — ABNORMAL HIGH (ref 0.76–1.27)
GFR calc Af Amer: 52 mL/min/{1.73_m2} — ABNORMAL LOW (ref >59)
GFR calc non Af Amer: 45 mL/min/{1.73_m2} — ABNORMAL LOW (ref >59)

## 2015-06-10 LAB — HEMOGLOBIN A1C
Est. average glucose Bld gHb Est-mCnc: 126 mg/dL
Hgb A1c MFr Bld: 6 % — ABNORMAL HIGH (ref 4.8–5.6)

## 2015-06-10 LAB — BUN: BUN: 22 mg/dL (ref 8–27)

## 2015-06-21 ENCOUNTER — Ambulatory Visit
Admission: RE | Admit: 2015-06-21 | Discharge: 2015-06-21 | Disposition: A | Discharge status: Home / Self Care | Payer: Medicare Other | Source: Ambulatory Visit | Attending: Diagnostic Neuroimaging | Admitting: Diagnostic Neuroimaging

## 2015-06-21 ENCOUNTER — Telehealth: Payer: Self-pay | Admitting: *Deleted

## 2015-06-21 DIAGNOSIS — G453 Amaurosis fugax: Secondary | ICD-10-CM

## 2015-06-21 DIAGNOSIS — G450 Vertebro-basilar artery syndrome: Secondary | ICD-10-CM

## 2015-06-21 MED ORDER — GADOBENATE DIMEGLUMINE 529 MG/ML IV SOLN
20.0000 mL | Freq: Once | INTRAVENOUS | Status: AC | PRN
  Administered 2015-06-21: 20 mL via INTRAVENOUS

## 2015-06-21 NOTE — Telephone Encounter (Signed)
Called and left a message for the pt, informing him that his labs were showing elevated levels. I informed him that I faxed these results to his PCP Dr. Willey Blade and that he needed to get in to see Dr. Willey Blade for a follow up to talk about diet, exercise and medications to help his levels. I asked him to call back if he needed anything or had any questions

## 2015-07-13 ENCOUNTER — Ambulatory Visit (INDEPENDENT_AMBULATORY_CARE_PROVIDER_SITE_OTHER): Payer: Medicare Other | Admitting: Diagnostic Neuroimaging

## 2015-07-13 ENCOUNTER — Encounter: Payer: Self-pay | Admitting: Diagnostic Neuroimaging

## 2015-07-13 VITALS — BP 132/76 | HR 84 | Ht 73.0 in | Wt 212.0 lb

## 2015-07-13 DIAGNOSIS — G450 Vertebro-basilar artery syndrome: Secondary | ICD-10-CM | POA: Diagnosis not present

## 2015-07-13 DIAGNOSIS — G453 Amaurosis fugax: Secondary | ICD-10-CM | POA: Diagnosis not present

## 2015-07-13 MED ORDER — SIMVASTATIN 20 MG PO TABS
20.0000 mg | ORAL_TABLET | Freq: Every day | ORAL | Status: DC
Start: 2015-07-13 — End: 2016-01-05

## 2015-07-13 NOTE — Progress Notes (Signed)
[facility]  PATIENT: Kyle Owen DOB: 1941-07-01  REFERRING CLINICIAN: Asencion Noble  HISTORY FROM: patient  REASON FOR VISIT: follow up   HISTORICAL  CHIEF COMPLAINT:  Chief Complaint  Patient presents with  . Vertebrobasilar artery syndrome    rm 7  . Follow-up    stroke    HISTORY OF PRESENT ILLNESS:   UPDATE 07/13/15: Since last visit, doing about the same. No new stroke symptoms. Right knee and left hip bother him.  PRIOR HPI (74 year old right-handed male here for evaluation of balance difficulty, dizziness, possible stroke. 10/26/2014 patient had an episode where a "shade came over the right eye" and he lost vision completely for 15 minutes. Patient was about to go to the emergency room but then symptoms resolve. He saw his PCP the next day who ordered carotid ultrasound which was negative. Patient had no focal numbness, weakness, slurred speech, trouble talking, headache with that event. 04/09/2015, patient was at a sporting clay competition, and after the first day he was sleeping in his RV at night, then woke up with nausea and spinning sensation lasting for 10 minutes. Symptoms resolved. The next day patient was feeling back to normal. The next night after falling asleep, patient woke up with similar symptoms for 10 minutes. This happened a third night in a row. No symptoms during the daytime. No subsequent long-term abnormal symptoms following this. 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 No ongoing headache, numbness, tingling, focal weakness. Patient has had generalized lack of grip strength in the bilateral hands over the past 10-15 years. He has had chronic low back pain problems, status post surgery in 2014, as well as right knee pain and problems.    REVIEW OF SYSTEMS: Full 14 system review of  systems performed and notable only for hearing loss spinning sensation moles shortness of breath loss of vision easy bruising easy bleeding dizziness depression.  ALLERGIES: Allergies  Allergen Reactions  . Flomax [Tamsulosin Hcl]     Pt thought he was dying    HOME MEDICATIONS: Outpatient Prescriptions Prior to Visit  Medication Sig Dispense Refill  . acetaminophen (TYLENOL) 500 MG tablet Take 500 mg by mouth every 6 (six) hours as needed.    Marland Kitchen aspirin 81 MG tablet Take 81 mg by mouth daily.    . budesonide-formoterol (SYMBICORT) 160-4.5 MCG/ACT inhaler Inhale 2 puffs into the lungs 2 (two) times daily.    . finasteride (PROSCAR) 5 MG tablet Take 5 mg by mouth daily.    . magnesium oxide (MAG-OX) 400 MG tablet Take 400 mg by mouth daily.    Marland Kitchen omeprazole (PRILOSEC) 20 MG capsule Take 1 capsule by mouth Daily.    . sertraline (ZOLOFT) 50 MG tablet Take 50 mg by mouth daily.      . verapamil (CALAN-SR) 240 MG CR tablet Take 240 mg by mouth daily.      . VENTOLIN HFA 108 (90 BASE) MCG/ACT inhaler Inhale 2 puffs into the lungs Every 4 hours as needed. Breathing.     No facility-administered medications prior to visit.    PAST MEDICAL HISTORY: Past Medical History  Diagnosis Date  . Hearing impairment   . Migraines     takes verapamil   . Colon polyps   . Arthritis   . Depression   . COPD (chronic obstructive pulmonary disease)   .  Shortness of breath   . GERD (gastroesophageal reflux disease)   . DDD (degenerative disc disease), cervical   . DDD (degenerative disc disease), lumbar   . Right knee DJD   . Blurred vision, right eye     constricted blood vessel; takes verapamil with good results  . Benign prostatic hypertrophy with urinary frequency   . Pneumonia 2013  . Thyroid mass   . Colon polyp     PAST SURGICAL HISTORY: Past Surgical History  Procedure Laterality Date  . Shoulder arthroscopy      left  . Cataract extraction      Right  . Elbow arthroscopy       left  . Colonoscopy  10/03/2011    Procedure: COLONOSCOPY;  Surgeon: Jamesetta So;  Location: [facility];  Service: Gastroenterology;  Laterality: N/A;  . Hernia repair      rih  . Cataract extraction w/phaco  12/16/2012    Procedure: CATARACT EXTRACTION PHACO AND INTRAOCULAR LENS PLACEMENT (IOC);  Surgeon: Tonny Branch, MD;  Location: [facility];  Service: Ophthalmology;  Laterality: Left;  CDE:17.31  . Knee arthroscopy Right 2015  . Back surgery    . Posterior lumbar fusion  2014  . Shoulder arthroscopy Left 2000  . Eye surgery    . Thyroid lobectomy Right 01/20/2015    dr Benjamine Mola  . Thyroidectomy Right 01/20/2015    Procedure: RIGHT THYROIDECTOMY;  Surgeon: Ascencion Dike, MD;  Location: [facility] OR;  Service: ENT;  Laterality: Right;    FAMILY HISTORY: Family History  Problem Relation Age of Onset  . Stroke Sister   . Breast cancer Sister   . Stroke Brother     SOCIAL HISTORY:  History   Social History  . Marital Status: Widowed    Spouse Name: N/A  . Number of Children: 3  . Years of Education: 14   Occupational History  . [employer] and [employer]     retired   Social History Main Topics  . Smoking status: Former Smoker -- 1.00 packs/day for 45 years    Types: Cigarettes    Quit date: 09/26/2010  . Smokeless tobacco: Never Used  . Alcohol Use: No  . Drug Use: No  . Sexual Activity: No   Other Topics Concern  . Not on file   Social History Narrative   Widowed, lives with dog, [employer] in [place], wife passed from Parkinson's disease 3 1/2 yrs ago   1 cup coffee daily     PHYSICAL EXAM  GENERAL EXAM/CONSTITUTIONAL: Vitals:  Filed Vitals:   07/13/15 0916 07/13/15 0928 07/13/15 0929  BP: 126/73 114/69 132/76  Pulse: 81 72 84  Height: 6\' 1"  (1.854 m)    Weight: 212 lb (96.163 kg)     Body mass index is 27.98 kg/(m^2). 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  awake, alert, oriented to person, place and time  recent and remote memory intact  normal attention and  concentration  language fluent, comprehension intact, naming intact,   fund of knowledge appropriate  CRANIAL NERVE:   2nd - no papilledema on fundoscopic exam  2nd, 3rd, 4th, 6th - pupils equal and reactive to light, visual fields full to confrontation, extraocular muscles intact, no nystagmus  5th - facial sensation symmetric  7th - facial strength symmetric  8th - hearing intact  9th - palate elevates symmetrically, uvula midline  11th - shoulder shrug symmetric  12th - tongue protrusion midline  MOTOR:   normal bulk and tone, full strength in the BUE, BLE; EXCEPT ATROPHY OF BILATERAL INTRINSIC HAND MUSCLES  SENSORY:   normal and symmetric to light touch, temperature, vibration; EXCEPT DECR VIB IN RIGHT FOOT  COORDINATION:   finger-nose-finger, fine finger movements normal  REFLEXES:   deep tendon reflexes present and symmetric  GAIT/STATION:   narrow based gait; ANTALGIC, LIMPING, DUE TO KNEE AND BACK PAIN; CANNOT WALK TOE, HEEL OR TANDEM; ROMBERG POSITIVE    DIAGNOSTIC DATA (LABS, IMAGING, TESTING) - I reviewed patient records, labs, notes, testing and imaging myself where available.  Lab Results  Component Value Date   WBC 4.7 01/12/2015   HGB 13.6 01/12/2015   HCT 42.6 01/12/2015   MCV 96.2 01/12/2015   PLT 189 01/12/2015      Component Value Date/Time   NA  141 01/12/2015 1106   K 4.4 01/12/2015 1106   CL 107 01/12/2015 1106   CO2 29 01/12/2015 1106   GLUCOSE 90 01/12/2015 1106   BUN 22 06/09/2015 1051   BUN 20 01/12/2015 1106   CREATININE 1.49* 06/09/2015 1051   CALCIUM 9.4 01/12/2015 1106   GFRNONAA 45* 06/09/2015 1051   GFRAA 52* 06/09/2015 1051   Lab Results  Component Value Date   CHOL 225* 06/09/2015   HDL 68 06/09/2015   LDLCALC 137* 06/09/2015   TRIG 100 06/09/2015   CHOLHDL 3.3 06/09/2015   Lab Results  Component Value Date   HGBA1C 6.0* 06/09/2015   No results found for: VITAMINB12 No results found for: TSH   03/07/13 TTE  - Left ventricle: The cavity size was normal. Wall thickness was increased in a pattern of mild LVH. Systolic functionwas normal. The estimated ejection fraction was in therange of 55% to 60%. - Aortic valve: AV is thickened, calcified with minimallyrestricted motion Peak and mean gradients through thevalve are 11 and 65mm Hg respectively. Mild regurgitation. - Pulmonary arteries: PA peak pressure: 3mm Hg (S).  10/29/14 carotid u/s - No focal hemodynamically significant stenosis is noted. Incidental note is made of a right thyroid nodule. Full thyroid ultrasound may be helpful for further evaluation and to delineate any other thyroid nodules.  04/20/15 MRI brain [I reviewed images myself and agree with interpretation. Small right cerebellar linear lesion, possible chronic lacunar infarct vs cerebellar folia. -VRP]  1.No acute intracranial abnormality. 2. Mild for age signal changes in the brain compatible with chronic small vessel disease.  06/21/15 MRA head - normal  06/21/15 MRA neck - normal  06/10/15 TTE - estimated ejection fraction was in the range of 60% to 65%. Wall motion was normal;there were no regional wall motion abnormalities. Compared to a prior echo in 2015, there are few changes. RVSP is lower at 29 mmHg.     ASSESSMENT AND PLAN  74 y.o. year old male here with episode of  right amaurosis fugax in November 2015, and 3 episodes of nausea and vertigo in April 2016. May represent anterior circulation TIA (right ophthalmic artery)  and 3 posterior circulation TIAs. This combination of symptoms raises possibility for cardio embolic source. MRA head/neck and TTE are unremarkable.  Dx: cryptogenic TIA / stroke  PLAN: I spent 25 minutes of face to face time with patient. Greater than 50% of time was spent in counseling and coordination of care with patient. In summary we discussed:  - check TEE and implanted loop recorder - follow up with Dr. Willey Blade re: A1c, lipid panel, kidney function abnl labs - continue aspirin 81mg  daily - start statin - stroke risk factor reduction, signs and symptoms, education reviewed  Orders Placed This Encounter  Procedures  . TEE   Meds ordered this encounter  Medications  . simvastatin (ZOCOR) 20 MG tablet    Sig: Take 1 tablet (20 mg total) by mouth daily at 6 PM.    Dispense:  30 tablet    Refill:  3   Return in about 3 months (around 10/13/2015).    Penni Bombard, MD 9/79/8921, 1:94 AM Certified in Neurology, Neurophysiology and Neuroimaging  [facility] [address], [place],  [address] [phone]

## 2015-07-19 ENCOUNTER — Telehealth: Payer: Self-pay | Admitting: Diagnostic Neuroimaging

## 2015-07-19 NOTE — Telephone Encounter (Signed)
Patient called and requested to speak with the nurse regarding a heart monitor her is supposed to have put in. Please call and advise.

## 2015-07-19 NOTE — Telephone Encounter (Addendum)
Left detailed vm for patient informing him that the order for TEE will go through insurance approval process, then he will get call with details of appointment. Left this caller's name, number for further questions.  1:23 pm  Received call back from patient stating he wants TEE scheduled at [facility], not [facility] as he and Dr Elgie Congo had discussed.

## 2015-07-20 NOTE — Telephone Encounter (Signed)
Pt called and was told there was an 11am appt. Available. He would like that time if possible. Please call and advise

## 2015-07-20 NOTE — Telephone Encounter (Addendum)
Left vm for patient requesting he call back to schedule TEE on a convenient day/time since he lives out of town. 9:48 am Patient returned call, stated Thurs, 07/22/15 @ 10 am would work for him.  Spoke with Rise Paganini, scheduler for [employer] and scheduled. She stated Dr Stanford Breed will perform.  case #[id number]  2:26 pm Called patient and informed him that this RN rescheduled his TEE for Thurs, 07/22/15 at 11:00 am. Reminded him to arrive 1 1/2 hours early. He verbalized understanding.

## 2015-07-22 ENCOUNTER — Other Ambulatory Visit (HOSPITAL_COMMUNITY): Payer: Medicare Other

## 2015-07-22 ENCOUNTER — Telehealth: Payer: Self-pay | Admitting: Cardiology

## 2015-07-22 ENCOUNTER — Ambulatory Visit (HOSPITAL_COMMUNITY)
Admission: RE | Admit: 2015-07-22 | Discharge: 2015-07-22 | Disposition: A | Discharge status: Home / Self Care | Payer: Medicare Other | Source: Ambulatory Visit | Attending: Cardiology | Admitting: Cardiology

## 2015-07-22 MED ORDER — SODIUM CHLORIDE 0.9 % IV SOLN: INTRAVENOUS | Status: DC

## 2015-07-22 NOTE — Telephone Encounter (Signed)
Patient came for TEE today, wasn't aware he needed driver and had no one with him. Was setup for another appt for tomorrow but cannot find a driver.  He would like to postpone til next week. Staff msg sent to [facility] to reschedule.

## 2015-07-22 NOTE — Telephone Encounter (Signed)
New Message       Pt calling stating that he has a TEE scheduled w/ Dr. Stanford Breed for tomorrow and he can't get a ride so he is needing to reschedule to sometime next wee, probably Tuesday. Please call back and advise.

## 2015-07-26 ENCOUNTER — Other Ambulatory Visit: Payer: Self-pay | Admitting: Diagnostic Neuroimaging

## 2015-07-26 DIAGNOSIS — G453 Amaurosis fugax: Secondary | ICD-10-CM

## 2015-07-26 DIAGNOSIS — G450 Vertebro-basilar artery syndrome: Secondary | ICD-10-CM

## 2015-07-27 ENCOUNTER — Ambulatory Visit (HOSPITAL_COMMUNITY)
Admission: RE | Admit: 2015-07-27 | Discharge: 2015-07-27 | Disposition: A | Discharge status: Home / Self Care | Payer: Medicare Other | Source: Ambulatory Visit | Attending: Cardiology | Admitting: Cardiology

## 2015-07-27 ENCOUNTER — Encounter (HOSPITAL_COMMUNITY)
Admission: RE | Disposition: A | Discharge status: Home / Self Care | Payer: Self-pay | Source: Ambulatory Visit | Attending: Cardiology

## 2015-07-27 ENCOUNTER — Encounter (HOSPITAL_COMMUNITY): Payer: Self-pay | Admitting: *Deleted

## 2015-07-27 ENCOUNTER — Ambulatory Visit (HOSPITAL_BASED_OUTPATIENT_CLINIC_OR_DEPARTMENT_OTHER): Payer: Medicare Other

## 2015-07-27 DIAGNOSIS — I351 Nonrheumatic aortic (valve) insufficiency: Secondary | ICD-10-CM | POA: Diagnosis not present

## 2015-07-27 DIAGNOSIS — Z8673 Personal history of transient ischemic attack (TIA), and cerebral infarction without residual deficits: Secondary | ICD-10-CM | POA: Insufficient documentation

## 2015-07-27 DIAGNOSIS — G453 Amaurosis fugax: Secondary | ICD-10-CM | POA: Diagnosis not present

## 2015-07-27 HISTORY — PX: TEE WITHOUT CARDIOVERSION: SHX5443

## 2015-07-27 SURGERY — ECHOCARDIOGRAM, TRANSESOPHAGEAL
Anesthesia: Moderate Sedation

## 2015-07-27 MED ORDER — MIDAZOLAM HCL 5 MG/ML IJ SOLN
INTRAMUSCULAR | Status: AC
  Filled 2015-07-27: qty 1

## 2015-07-27 MED ORDER — BUTAMBEN-TETRACAINE-BENZOCAINE 2-2-14 % EX AERO
INHALATION_SPRAY | CUTANEOUS | Status: DC | PRN
  Administered 2015-07-27: 2 via TOPICAL

## 2015-07-27 MED ORDER — FENTANYL CITRATE (PF) 100 MCG/2ML IJ SOLN
INTRAMUSCULAR | Status: DC | PRN
  Administered 2015-07-27 (×2): 25 ug via INTRAVENOUS

## 2015-07-27 MED ORDER — FENTANYL CITRATE (PF) 100 MCG/2ML IJ SOLN
INTRAMUSCULAR | Status: AC
  Filled 2015-07-27: qty 2

## 2015-07-27 MED ORDER — MIDAZOLAM HCL 10 MG/2ML IJ SOLN
INTRAMUSCULAR | Status: DC | PRN
  Administered 2015-07-27 (×2): 2 mg via INTRAVENOUS

## 2015-07-27 MED ORDER — SODIUM CHLORIDE 0.9 % IV SOLN: INTRAVENOUS | Status: DC

## 2015-07-27 NOTE — CV Procedure (Signed)
See full TEE report in [employer]; normal LV function; mild AI, MR and PI; mild to moderate TR; negative saline microcavitation study. Kyle Owen

## 2015-07-27 NOTE — H&P (Signed)
[name]  07/13/2015 9:30 AM  Office Visit  MRN:  [id number]   Description: Male DOB: 02-Aug-1941  Provider: Penni Bombard, MD  Department: [facility]       Diagnoses     Vertebrobasilar artery syndrome - Primary    ICD-9-CM: 435.3 ICD-10-CM: G45.0    Amaurosis fugax     ICD-9-CM: 362.34 ICD-10-CM: G45.3       Reason for Visit     Vertebrobasilar artery syndrome    rm 7    Follow-up    stroke    Reason for Visit History        Current Vitals  Most recent update: 07/13/2015 9:29 AM by Minna Antis, RN    BP Pulse Ht Wt BMI    132/76 mmHg 84 6\' 1"  (1.854 m) 212 lb (96.163 kg) 27.98 kg/m2    Vitals History     Progress Notes      Penni Bombard, MD at 07/13/2015 9:53 AM     Status: Signed       Expand All Collapse All     [facility]  PATIENT: Kyle Owen DOB: [birth year]  REFERRING CLINICIAN: Asencion Noble  HISTORY FROM: patient   REASON FOR VISIT: follow up   HISTORICAL  CHIEF COMPLAINT:  Chief Complaint  Patient presents with  . Vertebrobasilar artery syndrome    rm 7  . Follow-up    stroke    HISTORY OF PRESENT ILLNESS:   UPDATE 07/13/15: Since last visit, doing about the same. No new stroke symptoms. Right knee and left hip bother him.  PRIOR HPI (74 year old right-handed male here for evaluation of balance difficulty, dizziness, possible stroke. 10/26/2014 patient had an episode where a "shade came over the right eye" and he lost vision completely for 15 minutes. Patient was about to go to the emergency room but then symptoms resolve. He saw his PCP the next day who ordered carotid ultrasound which was negative. Patient had no focal numbness, weakness, slurred speech, trouble talking, headache with that event. 04/09/2015, patient was at a sporting clay competition, and after the first day he was sleeping in his RV at night, then woke up with nausea and spinning  sensation lasting for 10 minutes. Symptoms resolved. The next day patient was feeling back to normal. The next night after falling asleep, patient woke up with similar symptoms for 10 minutes. This happened a third night in a row. No symptoms during the daytime. No subsequent long-term abnormal symptoms following this. 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 No ongoing headache, numbness, tingling, focal weakness. Patient has had generalized lack of grip strength in the bilateral hands over the past 10-15 years. He has had chronic low back pain problems, status post surgery in 2014, as well as right knee pain and problems.    REVIEW OF SYSTEMS: Full 14 system review of systems performed and notable only for hearing loss spinning sensation moles shortness of breath loss of vision easy bruising easy bleeding dizziness depression.  ALLERGIES: Allergies  Allergen Reactions  . Flomax [Tamsulosin Hcl]     Pt thought he was dying    HOME MEDICATIONS: Outpatient Prescriptions Prior to Visit  Medication Sig Dispense Refill  . acetaminophen (TYLENOL) 500 MG tablet Take 500 mg by mouth every 6 (six) hours as needed.    Marland Kitchen aspirin 81  MG tablet Take 81 mg by mouth daily.    . budesonide-formoterol (SYMBICORT) 160-4.5 MCG/ACT inhaler Inhale 2 puffs into the lungs 2 (two) times daily.    . finasteride (PROSCAR) 5 MG tablet Take 5 mg by mouth daily.    . magnesium oxide (MAG-OX) 400 MG tablet Take 400 mg by mouth daily.    Marland Kitchen omeprazole (PRILOSEC) 20 MG capsule Take 1 capsule by mouth Daily.    . sertraline (ZOLOFT) 50 MG tablet Take 50 mg by mouth daily.     . verapamil (CALAN-SR) 240 MG CR tablet Take 240 mg by mouth daily.     . VENTOLIN HFA 108 (90 BASE) MCG/ACT inhaler Inhale 2 puffs into the lungs  Every 4 hours as needed. Breathing.     No facility-administered medications prior to visit.    PAST MEDICAL HISTORY: Past Medical History  Diagnosis Date  . Hearing impairment   . Migraines     takes verapamil   . Colon polyps   . Arthritis   . Depression   . COPD (chronic obstructive pulmonary disease)   . Shortness of breath   . GERD (gastroesophageal reflux disease)   . DDD (degenerative disc disease), cervical   . DDD (degenerative disc disease), lumbar   . Right knee DJD   . Blurred vision, right eye     constricted blood vessel; takes verapamil with good results  . Benign prostatic hypertrophy with urinary frequency   . Pneumonia 2013  . Thyroid mass   . Colon polyp     PAST SURGICAL HISTORY: Past Surgical History  Procedure Laterality Date  . Shoulder arthroscopy      left  . Cataract extraction      Right  . Elbow arthroscopy      left  . Colonoscopy  10/03/2011    Procedure: COLONOSCOPY; Surgeon: Jamesetta So; Location: [facility]; Service: Gastroenterology; Laterality: N/A;  . Hernia repair      rih  . Cataract extraction w/phaco  12/16/2012    Procedure: CATARACT EXTRACTION PHACO AND INTRAOCULAR LENS PLACEMENT (IOC); Surgeon: Tonny Branch, MD; Location: [facility] ORS; Service: Ophthalmology; Laterality: Left; CDE:17.31  . Knee arthroscopy Right 2015  . Back surgery    . Posterior lumbar fusion  2014  . Shoulder arthroscopy Left 2000  . Eye surgery    . Thyroid lobectomy Right 01/20/2015    dr Benjamine Mola  . Thyroidectomy Right 01/20/2015    Procedure: RIGHT THYROIDECTOMY; Surgeon: Ascencion Dike, MD; Location: [facility] OR; Service: ENT; Laterality: Right;    FAMILY HISTORY: Family History  Problem Relation Age of Onset  . Stroke Sister   . Breast cancer Sister   . Stroke Brother     SOCIAL  HISTORY:   History   Social History  . Marital Status: Widowed    Spouse Name: N/A  . Number of Children: 3  . Years of Education: 14   Occupational History  . [employer]     retired   Social History Main Topics  . Smoking status: Former Smoker -- 1.00 packs/day for 45 years    Types: Cigarettes    Quit date: 09/26/2010  . Smokeless tobacco: Never Used  . Alcohol Use: No  . Drug Use: No  . Sexual Activity: No   Other Topics Concern  . Not on file   Social History Narrative   Widowed, lives with dog, [place] in [place], wife passed from Parkinson's disease 3 1/2 yrs ago   71  cup coffee daily     PHYSICAL EXAM  GENERAL EXAM/CONSTITUTIONAL: Vitals:  Filed Vitals:   07/13/15 0916 07/13/15 0928 07/13/15 0929  BP: 126/73 114/69 132/76  Pulse: 81 72 84  Height: 6\' 1"  (1.854 m)    Weight: 212 lb (96.163 kg)      Body mass index is 27.98 kg/(m^2). 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  awake, alert, oriented to person, place and time  recent and remote memory intact  normal attention and concentration  language fluent, comprehension intact, naming intact,   fund of knowledge appropriate  CRANIAL NERVE:   2nd - no papilledema on fundoscopic exam  2nd, 3rd, 4th, 6th -  pupils equal and reactive to light, visual fields full to confrontation, extraocular muscles intact, no nystagmus  5th - facial sensation symmetric  7th - facial strength symmetric  8th - hearing intact  9th - palate elevates symmetrically, uvula midline  11th - shoulder shrug symmetric  12th - tongue protrusion midline  MOTOR:   normal bulk and tone, full strength in the BUE, BLE; EXCEPT ATROPHY OF BILATERAL INTRINSIC HAND MUSCLES  SENSORY:   normal and symmetric to light touch, temperature, vibration; EXCEPT DECR VIB IN RIGHT FOOT  COORDINATION:   finger-nose-finger, fine finger movements normal  REFLEXES:   deep tendon reflexes present and symmetric  GAIT/STATION:   narrow based gait; ANTALGIC, LIMPING, DUE TO KNEE AND BACK PAIN; CANNOT WALK TOE, HEEL OR TANDEM; ROMBERG POSITIVE    DIAGNOSTIC DATA (LABS, IMAGING, TESTING) - I reviewed patient records, labs, notes, testing and imaging myself where available.   Recent Labs    Lab Results  Component Value Date   WBC 4.7 01/12/2015   HGB 13.6 01/12/2015   HCT 42.6 01/12/2015   MCV 96.2 01/12/2015   PLT 189 01/12/2015      Labs (Brief)       Component Value Date/Time   NA 141 01/12/2015 1106   K 4.4 01/12/2015 1106   CL 107 01/12/2015 1106   CO2 29 01/12/2015 1106   GLUCOSE 90 01/12/2015 1106   BUN 22 06/09/2015 1051   BUN 20 01/12/2015 1106   CREATININE 1.49* 06/09/2015 1051   CALCIUM 9.4 01/12/2015 1106   GFRNONAA 45* 06/09/2015 1051   GFRAA 52* 06/09/2015 1051      Recent Labs    Lab Results  Component Value Date   CHOL 225* 06/09/2015   HDL 68 06/09/2015   LDLCALC 137* 06/09/2015   TRIG 100 06/09/2015   CHOLHDL 3.3 06/09/2015      Recent Labs    Lab Results  Component Value Date   HGBA1C 6.0* 06/09/2015      Recent Labs    No results found for: VITAMINB12    Recent Labs      No results found for: TSH     03/07/13 TTE  - Left ventricle: The cavity size was normal. Wall thickness was increased in a pattern  of mild LVH. Systolic functionwas normal. The estimated ejection fraction was in therange of 55% to 60%. - Aortic valve: AV is thickened, calcified with minimallyrestricted motion Peak and mean gradients through thevalve are 11 and 52mm Hg respectively. Mild regurgitation. - Pulmonary arteries: PA peak pressure: 60mm Hg (S).  10/29/14 carotid u/s - No focal hemodynamically significant stenosis is noted. Incidental note is made of a right thyroid nodule. Full thyroid ultrasound may be helpful for further evaluation and to delineate any other thyroid nodules.  04/20/15 MRI brain [I reviewed images myself and agree with interpretation. Small right cerebellar linear lesion, possible chronic lacunar infarct vs cerebellar folia. -VRP]  1.No acute intracranial abnormality. 2. Mild for age signal changes in the brain compatible with chronic small vessel disease.  06/21/15 MRA head - normal  06/21/15 MRA neck - normal  06/10/15 TTE - estimated ejection fraction was in the range of 60% to 65%. Wall motion was normal;there were no regional wall motion abnormalities. Compared to a prior echo in 2015, there are few changes. RVSP is lower at 29 mmHg.     ASSESSMENT AND PLAN  74 y.o. year old male here with episode of right amaurosis fugax in November 2015, and 3 episodes of nausea and vertigo in April 2016. 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 This combination of symptoms raises possibility for cardio embolic source. MRA head/neck and TTE are unremarkable.  Dx: cryptogenic TIA / stroke  PLAN: I spent 25 minutes of face to face time with patient. Greater than 50% of time was spent in counseling and coordination of care with patient. In summary we discussed:  - check TEE and implanted loop recorder -  follow up with Dr. Willey Blade re: A1c, lipid panel, kidney function abnl labs - continue aspirin 81mg  daily - start statin - stroke risk factor reduction, signs and symptoms, education reviewed  Orders Placed This Encounter  Procedures  . TEE   Meds ordered this encounter  Medications  . simvastatin (ZOCOR) 20 MG tablet    Sig: Take 1 tablet (20 mg total) by mouth daily at 6 PM.    Dispense: 30 tablet    Refill: 3   Return in about 3 months (around 10/13/2015).    Penni Bombard, MD 01/28/5596, 4:16 AM Certified in Neurology, Neurophysiology and Neuroimaging  [facility] [address] [place], [place] [address] [phone]       For TEE; no changes. Kirk Ruths

## 2015-07-27 NOTE — Interval H&P Note (Signed)
History and Physical Interval Note:  07/27/2015 2:08 PM  Kyle Owen  has presented today for surgery, with the diagnosis of AMAUROSIS FUGAX    The various methods of treatment have been discussed with the patient and family. After consideration of risks, benefits and other options for treatment, the patient has consented to  Procedure(s): TRANSESOPHAGEAL ECHOCARDIOGRAM (TEE) (N/A) as a surgical intervention .  The patient's history has been reviewed, patient examined, no change in status, stable for surgery.  I have reviewed the patient's chart and labs.  Questions were answered to the patient's satisfaction.     Kirk Ruths

## 2015-07-27 NOTE — Discharge Instructions (Signed)
Transesophageal Echocardiogram °Transesophageal echocardiography (TEE) is a special type of test that produces images of the heart by using sound waves (echocardiogram). This type of echocardiography can obtain better images of the heart than standard echocardiography. TEE is done by passing a flexible tube down the esophagus. The heart is located in front of the esophagus. Because the heart and esophagus are close to one another, your health care provider can take very clear, detailed pictures of the heart via ultrasound waves. °TEE may be done: °· If your health care provider needs more information based on standard echocardiography findings. °· If you had a stroke. This might have happened because a clot formed in your heart. TEE can visualize different areas of the heart and check for clots. °· To check valve anatomy and function. °· To check for infection on the inside of your heart (endocarditis). °· To evaluate the dividing wall (septum) of the heart and presence of a hole that did not close after birth (patent foramen ovale or atrial septal defect). °· To help diagnose a tear in the wall of the aorta (aortic dissection). °· During cardiac valve surgery. This allows the surgeon to assess the valve repair before closing the chest. °· During a variety of other cardiac procedures to guide positioning of catheters. °· Sometimes before a cardioversion, which is a shock to convert heart rhythm back to normal. °LET YOUR HEALTH CARE PROVIDER KNOW ABOUT:  °· Any allergies you have. °· All medicines you are taking, including vitamins, herbs, eye drops, creams, and over-the-counter medicines. °· Previous problems you or members of your family have had with the use of anesthetics. °· Any blood disorders you have. °· Previous surgeries you have had. °· Medical conditions you have. °· Swallowing difficulties. °· An esophageal obstruction. °RISKS AND COMPLICATIONS  °Generally, TEE is a safe procedure. However, as with any  procedure, complications can occur. Possible complications include an esophageal tear (rupture). °BEFORE THE PROCEDURE  °· Do not eat or drink for 6 hours before the procedure or as directed by your health care provider. °· Arrange for someone to drive you home after the procedure. Do not drive yourself home. During the procedure, you will be given medicines that can continue to make you feel drowsy and can impair your reflexes. °· An IV access tube will be started in the arm. °PROCEDURE  °· A medicine to help you relax (sedative) will be given through the IV access tube. °· A medicine may be sprayed or gargled to numb the back of the throat. °· Your blood pressure, heart rate, and breathing (vital signs) will be monitored during the procedure. °· The TEE probe is a long, flexible tube. The tip of the probe is placed into the back of the mouth, and you will be asked to swallow. This helps to pass the tip of the probe into the esophagus. Once the tip of the probe is in the correct area, your health care provider can take pictures of the heart. °· TEE is usually not a painful procedure. You may feel the probe press against the back of the throat. The probe does not enter the trachea and does not affect your breathing. °AFTER THE PROCEDURE  °· You will be in bed, resting, until you have fully returned to consciousness. °· When you first awaken, your throat may feel slightly sore and will probably still feel numb. This will improve slowly over time. °· You will not be allowed to eat or drink until it   is clear that the numbness has improved. °· Once you have been able to drink, urinate, and sit on the edge of the bed without feeling sick to your stomach (nausea) or dizzy, you may be cleared to go home. °· You should have a friend or family member with you for the next 24 hours after your procedure. °Document Released: 03/03/2003 Document Revised: 12/16/2013 Document Reviewed: 06/12/2013 °Exit[employer]® Patient Information  ©2015 [employer]. This information is not intended to replace advice given to you by your health care provider. Make sure you discuss any questions you have with your health care provider. ° °

## 2015-07-28 ENCOUNTER — Encounter (HOSPITAL_COMMUNITY): Payer: Self-pay | Admitting: Cardiology

## 2015-07-28 ENCOUNTER — Telehealth: Payer: Self-pay | Admitting: *Deleted

## 2015-07-28 DIAGNOSIS — G450 Vertebro-basilar artery syndrome: Secondary | ICD-10-CM

## 2015-07-28 DIAGNOSIS — G453 Amaurosis fugax: Secondary | ICD-10-CM

## 2015-07-28 NOTE — Telephone Encounter (Signed)
Spoke with patient and informed him pre Dr Leta Baptist, TEE results are "okay". He is questioning whether he will still get loop recorder implanted.  Informed him that this RN will discuss with Dr Leta Baptist and let him know. He also questioned if he should double his ASA dose, currently taking 81 mg daily. Advised he not double the ASA dose. He states he has FU with Dr Willey Blade, PCP tomorrow, re: lab results.

## 2015-07-28 NOTE — Telephone Encounter (Signed)
-----   Message from Penni Bombard, MD sent at 07/28/2015 11:09 AM EDT ----- Regarding: RE: orders for TEE on 07/27/15 [name] looks ok. Let patient know.  -VRP  ----- Message -----    From: Minna Antis, RN    Sent: 07/28/2015  10:19 AM      To: Penni Bombard, MD Subject: RE: orders for TEE on 07/27/15                   He had TEE yesterday. See below:  See full TEE report in [employer]; normal LV function; mild AI, MR and PI; mild to moderate TR; negative saline microcavitation study. Kirk Ruths  I'll  be glad to call patient with results; let me know.  Thank you, Verneita Griffes ----- Message -----    From: Penni Bombard, MD    Sent: 07/26/2015   1:06 PM      To: Minna Antis, RN, Glynis Smiles, RN Subject: RE: orders for TEE on 07/27/15                   Orders are in epic. See orders review tab.  MC, can you look into this?  ----- Message -----    From: Glynis Smiles, RN    Sent: 07/26/2015  12:13 PM      To: Penni Bombard, MD, Candis Schatz Subject: orders for TEE on 07/27/15                       We are still waiting on orders for Kyle Owen [birth year]; pt scheduled for TEE on 07/27/15. Thanks, [employer]

## 2015-07-29 DIAGNOSIS — E785 Hyperlipidemia, unspecified: Secondary | ICD-10-CM | POA: Diagnosis not present

## 2015-07-29 DIAGNOSIS — G463 Brain stem stroke syndrome: Secondary | ICD-10-CM | POA: Diagnosis not present

## 2015-07-29 DIAGNOSIS — Z6828 Body mass index (BMI) 28.0-28.9, adult: Secondary | ICD-10-CM | POA: Diagnosis not present

## 2015-07-29 NOTE — Telephone Encounter (Signed)
Yes, go ahead with loop recorder (LINQ). pls set up. -VRP

## 2015-07-30 NOTE — Telephone Encounter (Addendum)
Left detailed message requesting call back today to set patient up for loop recorder implant.  10:40 am Kyle Owen returned call, stated there is not order for loop recorder. She states she will discuss with Dr Stanford Breed and call back today.

## 2015-07-30 NOTE — Telephone Encounter (Signed)
Patient called inquiring about TEE results and loop recorder.Please call and advise.

## 2015-07-30 NOTE — Telephone Encounter (Signed)
Ambulatory referral to [facility], Dr Stanford Breed faxed to Dr Jacalyn Lefevre office, attn: Dr Stanford Breed, Almedia Balls. Left vm for Almedia Balls informing her of same.   21:05 pm Spoke with patient and informed him referral was faxed to Dr Jacalyn Lefevre office re: loop recorder. Informed him the office contact is Holley Raring. Advised he call to FU if he doesn't hear from them by mid next week. He stated he "would wait for their call".  He requested MRA, TEE results be faxed to Dr Salena Saner, his PCP. Successfully faxed today.

## 2015-07-30 NOTE — Telephone Encounter (Signed)
Kay/Dr Stanford Breed is calling back

## 2015-08-02 NOTE — Telephone Encounter (Addendum)
Patient called stating he is confused as to the referral to Dr Percival Spanish. He thought he was to have a heart monitor. Please call and advise. Patient can be reached at [phone].

## 2015-08-03 NOTE — Telephone Encounter (Signed)
Does patient have the consult with Dr. Percival Spanish setup? If not, pls setup, so he can have consult, and then loop recorder. -VRP

## 2015-08-03 NOTE — Telephone Encounter (Signed)
Spoke with patient who states he is scheduled for consultation with Dr Percival Spanish on 08/06/15. He states that Dr [employer] office informed him his insurance did not approve of loop recorder, so he needs consultation to determine if he needs recorder. Advised he call Dr Hochrein's office if he has concerns, questions and ask to speak with Holley Raring, business services supervisor. He stated he will call today. Informed him this RN will route this conversation to Dr Leta Baptist for any further input and call him back today if Dr Leta Baptist has any further information for him. He verbalized understanding, appreciation.

## 2015-08-04 ENCOUNTER — Telehealth: Payer: Self-pay | Admitting: Nurse Practitioner

## 2015-08-04 NOTE — Telephone Encounter (Signed)
Received message that patient was being referred for implantable loop recorder with cryptogenic stroke and negative TEE.   I spoke with patient by telephone today and explained rationale and implant procedure.  He is willing to proceed.  I have arranged for implant 08/10/15 at 7AM with Drs Allred/Camnitz - we will do official consult that morning.  The patient is aware and agrees with plan.  Chanetta Marshall, NP 08/04/2015 5:03 PM

## 2015-08-06 ENCOUNTER — Ambulatory Visit: Payer: Medicare Other | Admitting: Cardiology

## 2015-08-10 ENCOUNTER — Ambulatory Visit (HOSPITAL_COMMUNITY)
Admission: RE | Admit: 2015-08-10 | Discharge: 2015-08-10 | Disposition: A | Discharge status: Home / Self Care | Payer: Medicare Other | Source: Ambulatory Visit | Attending: Cardiology | Admitting: Cardiology

## 2015-08-10 ENCOUNTER — Encounter (HOSPITAL_COMMUNITY)
Admission: RE | Disposition: A | Discharge status: Home / Self Care | Payer: Self-pay | Source: Ambulatory Visit | Attending: Cardiology

## 2015-08-10 DIAGNOSIS — I639 Cerebral infarction, unspecified: Secondary | ICD-10-CM | POA: Diagnosis not present

## 2015-08-10 HISTORY — PX: EP IMPLANTABLE DEVICE: SHX172B

## 2015-08-10 SURGERY — LOOP RECORDER INSERTION
Anesthesia: LOCAL

## 2015-08-10 MED ORDER — LIDOCAINE-EPINEPHRINE 1 %-1:100000 IJ SOLN
INTRAMUSCULAR | Status: AC
  Filled 2015-08-10: qty 1

## 2015-08-10 MED ORDER — LIDOCAINE-EPINEPHRINE 1 %-1:100000 IJ SOLN
INTRAMUSCULAR | Status: DC | PRN
  Administered 2015-08-10: 15 mL

## 2015-08-10 SURGICAL SUPPLY — 2 items
LOOP REVEAL LINQSYS (Prosthesis & Implant Heart) ×1 IMPLANT
PACK LOOP INSERTION (CUSTOM PROCEDURE TRAY) ×2 IMPLANT

## 2015-08-10 NOTE — H&P (Signed)
ELECTROPHYSIOLOGY CONSULT NOTE  Patient ID: Kyle Owen MRN: [id number], DOB/AGE: 12/31/1940   Date of Consult: 08/10/2015  Primary Physician: Asencion Noble, MD Primary Cardiologist: new to [facility] Reason for Consultation: Cryptogenic stroke; recommendations regarding Implantable Loop Recorder  History of Present Illness Kyle Owen had TIA symptoms in 10/2014 and again in 03/2015.  He was seen by neurology as an outpatient and underwent work up for TIA including echocardiogram and carotid dopplers.  Recent TEE demonstrated EF 55-60%, no RWMA, mild AI, mild to moderate TR, mild to moderate atherosclerosis descending aorta.  The patient denies chest pain, shortness of breath, dizziness, frequent palpitations, or syncope. He did have one episode of tachycardia several years ago that he attributes to eating too much sugar.   EP has been asked to evaluate for placement of an implantable loop recorder to monitor for atrial fibrillation.  ROS is negative except as outlined above.    Past Medical History  Diagnosis Date  . Hearing impairment   . Migraines     takes verapamil   . Colon polyps   . Arthritis   . Depression   . COPD (chronic obstructive pulmonary disease)   . Shortness of breath   . GERD (gastroesophageal reflux disease)   . DDD (degenerative disc disease), cervical   . DDD (degenerative disc disease), lumbar   . Right knee DJD   . Blurred vision, right eye     constricted blood vessel; takes verapamil with good results  . Benign prostatic hypertrophy with urinary frequency   . Pneumonia 2013  . Thyroid mass   . Colon polyp      Surgical History:  Past Surgical History  Procedure Laterality Date  . Shoulder arthroscopy      left  . Cataract extraction      Right  . Elbow arthroscopy      left  . Colonoscopy  10/03/2011    Procedure: COLONOSCOPY;  Surgeon: Jamesetta So;  Location: [facility];  Service: Gastroenterology;  Laterality: N/A;  .  Hernia repair      rih  . Cataract extraction w/phaco  12/16/2012    Procedure: CATARACT EXTRACTION PHACO AND INTRAOCULAR LENS PLACEMENT (IOC);  Surgeon: Tonny Branch, MD;  Location: [facility] ORS;  Service: Ophthalmology;  Laterality: Left;  CDE:17.31  . Knee arthroscopy Right 2015  . Back surgery    . Posterior lumbar fusion  2014  . Shoulder arthroscopy Left 2000  . Eye surgery    . Thyroid lobectomy Right 01/20/2015    dr Benjamine Mola  . Thyroidectomy Right 01/20/2015    Procedure: RIGHT THYROIDECTOMY;  Surgeon: Ascencion Dike, MD;  Location: [facility];  Service: ENT;  Laterality: Right;  . Tee without cardioversion N/A 07/27/2015    Procedure: TRANSESOPHAGEAL ECHOCARDIOGRAM (TEE);  Surgeon: Lelon Perla, MD;  Location: [facility] ENDOSCOPY;  Service: Cardiovascular;  Laterality: N/A;     Prescriptions prior to admission  Medication Sig Dispense Refill Last Dose  . acetaminophen (TYLENOL) 500 MG tablet Take 500 mg by mouth every 6 (six) hours as needed for moderate pain.    08/10/2015 at Unknown time  . aspirin 81 MG tablet Take 81 mg by mouth daily.   08/10/2015 at Unknown time  . budesonide-formoterol (SYMBICORT) 160-4.5 MCG/ACT inhaler Inhale 2 puffs into the lungs 2 (two) times daily.   08/10/2015 at Unknown time  . finasteride (PROSCAR) 5 MG tablet Take 5 mg by mouth daily.   08/10/2015 at Unknown time  .  magnesium oxide (MAG-OX) 400 MG tablet Take 400 mg by mouth daily.   08/10/2015 at Unknown time  . omeprazole (PRILOSEC) 20 MG capsule Take 20 mg by mouth Daily.    08/10/2015 at Unknown time  . sertraline (ZOLOFT) 50 MG tablet Take 50 mg by mouth daily.     08/10/2015 at Unknown time  . simvastatin (ZOCOR) 20 MG tablet Take 1 tablet (20 mg total) by mouth daily at 6 PM. 30 tablet 3 08/10/2015 at Unknown time  . verapamil (CALAN-SR) 240 MG CR tablet Take 240 mg by mouth daily.     08/10/2015 at Unknown time  . VENTOLIN HFA 108 (90 BASE) MCG/ACT inhaler Inhale 2 puffs into the lungs Every 4 hours as needed for wheezing  or shortness of breath.    More than a month at Unknown time    Inpatient Medications:   Allergies:  Allergies  Allergen Reactions  . Flomax [Tamsulosin Hcl] Other (See Comments)    Pt thought he was dying    Social History   Social History  . Marital Status: Widowed    Spouse Name: N/A  . Number of Children: 3  . Years of Education: 14   Occupational History  . [employer] and [employer]     retired   Social History Main Topics  . Smoking status: Former Smoker -- 1.00 packs/day for 45 years    Types: Cigarettes    Quit date: 09/26/2010  . Smokeless tobacco: Never Used  . Alcohol Use: No  . Drug Use: No  . Sexual Activity: No   Other Topics Concern  . Not on file   Social History Narrative   Widowed, lives with dog, [place], wife passed from Parkinson's disease 3 1/2 yrs ago   1 cup coffee daily     Family History  Problem Relation Age of Onset  . Stroke Sister   . Breast cancer Sister   . Stroke Brother      Physical Exam: Filed Vitals:   08/10/15 0621  BP: 130/66  Pulse: 69  Temp: 98 F (36.7 C)  TempSrc: Oral  Resp: 18  Height: 6\' 1"  (1.854 m)  Weight: 210 lb (95.255 kg)  SpO2: 97%    GEN- The patient is well appearing, alert and oriented x 3 today.   Head- normocephalic, atraumatic Eyes-  Sclera clear, conjunctiva pink Ears- hearing intact Oropharynx- clear Neck- supple, Lungs- Clear to ausculation bilaterally, normal work of breathing Heart- Regular rate and rhythm, no murmurs, rubs or gallops, PMI not laterally displaced GI- soft, NT, ND, + BS Extremities- no clubbing, cyanosis, or edema MS- no significant deformity or atrophy Skin- no rash or lesion Psych- euthymic mood, full affect   Labs:   Lab Results  Component Value Date   WBC 4.7 01/12/2015   HGB 13.6 01/12/2015   HCT 42.6 01/12/2015   MCV 96.2 01/12/2015   PLT 189 01/12/2015    12-lead ECG sinus rhythm, rate 63, normal intervals All prior EKG's in EPIC reviewed with  no documented atrial fibrillation  Assessment and Plan:  1. Cryptogenic stroke/TIA The patient presents with cryptogenic stroke.  Recent TEE without cause for stroke identified.  I spoke at length with the patient about monitoring for afib with either a 30 day event monitor or an implantable loop recorder.  Risks, benefits, and alteratives to implantable loop recorder were discussed with the patient today.   At this time, the patient is very clear in their decision to proceed  with implantable loop recorder.   Wound care was reviewed with the patient (keep incision clean and dry for 3 days).  Wound check scheduled for 08/25/15 at 10:30AM.  Please call with questions.   Patsey Berthold, NP 08/10/2015 7:16 AM   I have seen, examined the patient, and reviewed the above assessment and plan.  On exam, RRR. Changes to above are made where necessary.  Will proceed with ILR according to cryptogenic stroke protocol.  Co Sign: Thompson Grayer, MD 08/10/2015 7:41 AM

## 2015-08-10 NOTE — Discharge Instructions (Signed)
Keep wound clean and dry for 3 days.  Call the office for redness, swelling, drainage.  We will take steri-strips off when we see you at wound check.  Ok to remove big dressing in the morning.

## 2015-08-11 ENCOUNTER — Encounter (HOSPITAL_COMMUNITY): Payer: Self-pay | Admitting: Internal Medicine

## 2015-08-11 ENCOUNTER — Telehealth: Payer: Self-pay | Admitting: *Deleted

## 2015-08-11 NOTE — Telephone Encounter (Signed)
Pt called to discuss monitor function. Pt concerned he only has 1-2 signal bars. I had pt send manual remote. Transmission took longer than usual. Pt did express challenges w/ using his own cell phone at home due to reception. Remote was not received during duration of our call. Pt suggested ending the call while he continued to watch his monitor. Pt knows if he sees the green check, his transmission was successful. Pt also aware it screen shuts down or he leaves the Carelink monitor then returns to check the status, he may push the gray circle once to check last date transmitted. Pt will call clinic if concerns or questions arise.

## 2015-08-11 NOTE — Telephone Encounter (Signed)
Called pt to discuss concern of loop recorder not working.  LMOVM w/ direct # to device clinic.

## 2015-08-12 ENCOUNTER — Ambulatory Visit (INDEPENDENT_AMBULATORY_CARE_PROVIDER_SITE_OTHER): Payer: Medicare Other | Admitting: Otolaryngology

## 2015-08-12 DIAGNOSIS — R490 Dysphonia: Secondary | ICD-10-CM

## 2015-08-12 DIAGNOSIS — E079 Disorder of thyroid, unspecified: Secondary | ICD-10-CM | POA: Diagnosis not present

## 2015-08-12 DIAGNOSIS — D440 Neoplasm of uncertain behavior of thyroid gland: Secondary | ICD-10-CM | POA: Diagnosis not present

## 2015-08-25 ENCOUNTER — Ambulatory Visit (INDEPENDENT_AMBULATORY_CARE_PROVIDER_SITE_OTHER): Payer: Medicare Other | Admitting: *Deleted

## 2015-08-25 DIAGNOSIS — I639 Cerebral infarction, unspecified: Secondary | ICD-10-CM

## 2015-08-25 LAB — CUP PACEART INCLINIC DEVICE CHECK
Date Time Interrogation Session: 20160831104209
Zone Setting Detection Interval: 2000 ms
Zone Setting Detection Interval: 3000 ms
Zone Setting Detection Interval: 390 ms

## 2015-08-25 NOTE — Progress Notes (Signed)
Loop check in clinic. Wound well healed- edges approximated, no redness, swelling or drainage. Pt educated about wound care. Pt with no episodes. Monthly Carelink Summary Reports, ROV with JA PRN.

## 2015-09-07 DIAGNOSIS — M4302 Spondylolysis, cervical region: Secondary | ICD-10-CM | POA: Diagnosis not present

## 2015-09-07 DIAGNOSIS — M47816 Spondylosis without myelopathy or radiculopathy, lumbar region: Secondary | ICD-10-CM | POA: Diagnosis not present

## 2015-09-09 ENCOUNTER — Telehealth: Payer: Self-pay | Admitting: Internal Medicine

## 2015-09-09 ENCOUNTER — Telehealth: Payer: Self-pay | Admitting: Diagnostic Neuroimaging

## 2015-09-09 ENCOUNTER — Ambulatory Visit (INDEPENDENT_AMBULATORY_CARE_PROVIDER_SITE_OTHER): Payer: Medicare Other | Admitting: *Deleted

## 2015-09-09 ENCOUNTER — Encounter: Payer: Self-pay | Admitting: Internal Medicine

## 2015-09-09 DIAGNOSIS — I639 Cerebral infarction, unspecified: Secondary | ICD-10-CM | POA: Diagnosis not present

## 2015-09-09 NOTE — Telephone Encounter (Signed)
Continue aspirin 81mg  daily. Unless we find atrial fibrilliation or other cardio-embolic source, then we may consider anti-coagulation. -VRP

## 2015-09-09 NOTE — Telephone Encounter (Signed)
I called patient. Advised on managing stroke risk factor reduction. Recommend aspirin 81mg  + statin for now. Monitor BP and blood sugars. Continue healthy exercise and good nutrition. Patient requesting to post-pone appt since he is feeling well. Agree with plan. -VRP

## 2015-09-09 NOTE — Telephone Encounter (Signed)
Pt is calling and is wondering he should be put on blood thinner due to having mini-strokes. Please call and advise [phone]

## 2015-09-09 NOTE — Telephone Encounter (Signed)
Left vm requesting call back; left this office's hours of operation and number.

## 2015-09-09 NOTE — Telephone Encounter (Signed)
Spoke with patient and let him know that the Neurologist would be the one to make that decision at present.  If his Kyle Owen shows anything that could have caused like afib we will contact him at that time to discuss.  His sister is a retired Therapist, sports and very worried about him as she is taking care of their brother after a major CVA

## 2015-09-09 NOTE — Progress Notes (Signed)
Loop recorder 

## 2015-09-09 NOTE — Telephone Encounter (Signed)
Spoke with patient and informed him, per Dr Leta Baptist that ASA is his recommendation at this time. Also inquired if he has had any issues, episodes since his last visit in this office. Patient states he has "had no more episodes since he was last seen here". He states his sister-in-law is a retired Marine scientist and called him very upset. He states that his older brother and sister have both had "debilitating strokes", and he lives alone. He is concerned and does not want "that to happen to him". Informed him that unless atrial fibrillation or other cardio-embolic sources are found, Dr Leta Baptist will not prescribe other medication. Informed him this RN will route his concerns to Dr Leta Baptist and call him back tomorrow with his reply. Patient verbalized understanding, appreciation.

## 2015-09-09 NOTE — Telephone Encounter (Signed)
New message     Pt has had multiple mini strokes.  Talk to a nurse about why he is not on a blood thinner?

## 2015-09-09 NOTE — Telephone Encounter (Signed)
Patient called returning [place] call. He was outside before, he will stay in the house for the call.

## 2015-09-16 LAB — CUP PACEART REMOTE DEVICE CHECK: Date Time Interrogation Session: 20160915113522

## 2015-09-16 NOTE — Progress Notes (Signed)
Carelink summary report received. Battery status OK. Normal device function. No new symptom episodes, tachy episodes, brady, or pause episodes. No new AF episodes. Monthly summary reports and ROV with JA PRN. 

## 2015-09-30 ENCOUNTER — Encounter: Payer: Self-pay | Admitting: Internal Medicine

## 2015-10-08 ENCOUNTER — Ambulatory Visit (INDEPENDENT_AMBULATORY_CARE_PROVIDER_SITE_OTHER): Payer: Medicare Other | Admitting: *Deleted

## 2015-10-08 DIAGNOSIS — I639 Cerebral infarction, unspecified: Secondary | ICD-10-CM

## 2015-10-08 LAB — CUP PACEART REMOTE DEVICE CHECK: Date Time Interrogation Session: 20161015113531

## 2015-10-13 DIAGNOSIS — E785 Hyperlipidemia, unspecified: Secondary | ICD-10-CM | POA: Diagnosis not present

## 2015-10-13 DIAGNOSIS — Z79899 Other long term (current) drug therapy: Secondary | ICD-10-CM | POA: Diagnosis not present

## 2015-10-13 NOTE — Progress Notes (Signed)
LOOP RECORDER  

## 2015-10-19 ENCOUNTER — Ambulatory Visit: Payer: Medicare Other | Admitting: Diagnostic Neuroimaging

## 2015-10-20 DIAGNOSIS — E785 Hyperlipidemia, unspecified: Secondary | ICD-10-CM | POA: Diagnosis not present

## 2015-10-20 DIAGNOSIS — Z6827 Body mass index (BMI) 27.0-27.9, adult: Secondary | ICD-10-CM | POA: Diagnosis not present

## 2015-10-20 DIAGNOSIS — Z23 Encounter for immunization: Secondary | ICD-10-CM | POA: Diagnosis not present

## 2015-10-20 DIAGNOSIS — F5221 Male erectile disorder: Secondary | ICD-10-CM | POA: Diagnosis not present

## 2015-10-20 DIAGNOSIS — L723 Sebaceous cyst: Secondary | ICD-10-CM | POA: Diagnosis not present

## 2015-10-20 NOTE — Progress Notes (Signed)
Carelink summary report received. Battery status OK. Normal device function. No new symptom episodes, tachy episodes, brady, or pause episodes. No new AF episodes. Monthly summary reports and ROV with JA PRN. 

## 2015-11-03 ENCOUNTER — Ambulatory Visit (HOSPITAL_COMMUNITY)
Admission: RE | Admit: 2015-11-03 | Discharge: 2015-11-03 | Disposition: A | Discharge status: Home / Self Care | Payer: Medicare Other | Source: Ambulatory Visit | Attending: Internal Medicine | Admitting: Internal Medicine

## 2015-11-03 ENCOUNTER — Other Ambulatory Visit (HOSPITAL_COMMUNITY): Payer: Self-pay | Admitting: Internal Medicine

## 2015-11-03 DIAGNOSIS — R55 Syncope and collapse: Secondary | ICD-10-CM | POA: Diagnosis not present

## 2015-11-03 DIAGNOSIS — R059 Cough, unspecified: Secondary | ICD-10-CM

## 2015-11-03 DIAGNOSIS — R05 Cough: Secondary | ICD-10-CM | POA: Diagnosis not present

## 2015-11-03 DIAGNOSIS — J189 Pneumonia, unspecified organism: Secondary | ICD-10-CM | POA: Diagnosis not present

## 2015-11-03 DIAGNOSIS — R509 Fever, unspecified: Secondary | ICD-10-CM | POA: Diagnosis not present

## 2015-11-03 DIAGNOSIS — J440 Chronic obstructive pulmonary disease with acute lower respiratory infection: Secondary | ICD-10-CM | POA: Diagnosis not present

## 2015-11-03 DIAGNOSIS — Z6827 Body mass index (BMI) 27.0-27.9, adult: Secondary | ICD-10-CM | POA: Diagnosis not present

## 2015-11-08 ENCOUNTER — Ambulatory Visit (INDEPENDENT_AMBULATORY_CARE_PROVIDER_SITE_OTHER): Payer: Medicare Other | Admitting: Diagnostic Neuroimaging

## 2015-11-08 ENCOUNTER — Ambulatory Visit (INDEPENDENT_AMBULATORY_CARE_PROVIDER_SITE_OTHER): Payer: Medicare Other | Admitting: *Deleted

## 2015-11-08 ENCOUNTER — Encounter: Payer: Self-pay | Admitting: Diagnostic Neuroimaging

## 2015-11-08 VITALS — BP 108/64 | HR 82 | Ht 73.0 in | Wt 200.2 lb

## 2015-11-08 DIAGNOSIS — I639 Cerebral infarction, unspecified: Secondary | ICD-10-CM

## 2015-11-08 DIAGNOSIS — R55 Syncope and collapse: Secondary | ICD-10-CM | POA: Diagnosis not present

## 2015-11-08 DIAGNOSIS — R42 Dizziness and giddiness: Secondary | ICD-10-CM | POA: Diagnosis not present

## 2015-11-08 NOTE — Patient Instructions (Signed)
Thank you for coming to see [place] at [facility]. I hope we have been able to provide you high quality care today.  You may receive a patient satisfaction survey over the next few weeks. We would appreciate your feedback and comments so that we may continue to improve ourselves and the health of our patients.  - follow up with Dr. Willey Blade - follow up with Dr. Rayann Heman (regarding passing out on 11/03/15)   ~~~~~~~~~~~~~~~~~~~~~~~~~~~~~~~~~~~~~~~~~~~~~~~~~~~~~~~~~~~~~~~~~  DR. Elnor Renovato'S GUIDE TO HAPPY AND HEALTHY LIVING These are some of my general health and wellness recommendations. Some of them may apply to you better than others. Please use common sense as you try these suggestions and feel free to ask me any questions.   ACTIVITY/FITNESS Mental, social, emotional and physical stimulation are very important for brain and body health. Try learning a new activity (arts, music, language, sports, games).  Keep moving your body to the best of your abilities. You can do this at home, inside or outside, the park, community center, gym or anywhere you like. Consider a physical therapist or personal trainer to get started. Consider the app Sworkit. Fitness trackers such as smart-watches, smart-phones or Fitbits can help as well.   NUTRITION Eat more plants: colorful vegetables, nuts, seeds and berries.  Eat less sugar, salt, preservatives and processed foods.  Avoid toxins such as cigarettes and alcohol.  Drink water when you are thirsty. Warm water with a slice of lemon is an excellent morning drink to start the day.  Consider these websites for more information The Nutrition Source (https://www.henry-hernandez.biz/) Precision Nutrition (WindowBlog.ch)   RELAXATION Consider practicing mindfulness meditation or other relaxation techniques such as deep breathing, prayer, yoga, tai chi, massage. See website mindful.org or the apps  Headspace or Calm to help get started.   SLEEP Try to get at least 7-8+ hours sleep per day. Regular exercise and reduced caffeine will help you sleep better. Practice good sleep hygeine techniques. See website sleep.org for more information.   PLANNING Prepare estate planning, living will, healthcare POA documents. Sometimes this is best planned with the help of an attorney. Theconversationproject.org and agingwithdignity.org are excellent resources.

## 2015-11-08 NOTE — Progress Notes (Signed)
Carelink summary report / loop recorder 

## 2015-11-08 NOTE — Progress Notes (Signed)
[facility]  PATIENT: Kyle Owen DOB: April 10, 1941  REFERRING CLINICIAN: Asencion Noble  HISTORY FROM: patient  REASON FOR VISIT: follow up   HISTORICAL  CHIEF COMPLAINT:  Chief Complaint  Patient presents with  . Vertebrobasilar artery syndrome    rm 7  . Follow-up    4 month    HISTORY OF PRESENT ILLNESS:   UPDATE 11/08/15: Since last visit, continues with intermittent dizzy spells. Also passed out while getting chest xray (11/03/15; arms raised, turning). BP was 85/45. He was dx'd with pneumonia and now on abx.   UPDATE 07/13/15: Since last visit, doing about the same. No new stroke symptoms. Right knee and left hip bother him.  PRIOR HPI (74 year old right-handed male here for evaluation of balance difficulty, dizziness, possible stroke. 10/26/2014 patient had an episode where a "shade came over the right eye" and he lost vision completely for 15 minutes. Patient was about to go to the emergency room but then symptoms resolve. He saw his PCP the next day who ordered carotid ultrasound which was negative. Patient had no focal numbness, weakness, slurred speech, trouble talking, headache with that event. 04/09/2015, patient was at a sporting clay competition, and after the first day he was sleeping in his RV at night, then woke up with nausea and spinning sensation lasting for 10 minutes. Symptoms resolved. The next day patient was feeling back to normal. The next night after falling asleep, patient woke up with similar symptoms for 10 minutes. This happened a third night in a row. No symptoms during the daytime. No subsequent long-term abnormal symptoms following this. 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 No ongoing headache, numbness, tingling, focal weakness. Patient has had generalized lack of grip strength in the  bilateral hands over the past 10-15 years. He has had chronic low back pain problems, status post surgery in 2014, as well as right knee pain and problems.   REVIEW OF SYSTEMS: Full 14 system review of systems performed and notable only for hearing loss spinning sensation moles shortness of breath loss of vision easy bruising easy bleeding dizziness depression.  ALLERGIES: Allergies  Allergen Reactions  . Flomax [Tamsulosin Hcl] Other (See Comments)    Pt thought he was dying    HOME MEDICATIONS: Outpatient Prescriptions Prior to Visit  Medication Sig Dispense Refill  . acetaminophen (TYLENOL) 500 MG tablet Take 500 mg by mouth every 6 (six) hours as needed for moderate pain.     Marland Kitchen aspirin 81 MG tablet Take 81 mg by mouth daily.    . budesonide-formoterol (SYMBICORT) 160-4.5 MCG/ACT inhaler Inhale 2 puffs into the lungs 2 (two) times daily.    . finasteride (PROSCAR) 5 MG tablet Take 5 mg by mouth daily.    . sertraline (ZOLOFT) 50 MG tablet Take 50 mg by mouth daily.      . simvastatin (ZOCOR) 20 MG tablet Take 1 tablet (20 mg total) by mouth daily at 6 PM. 30 tablet 3  . VENTOLIN HFA 108 (90 BASE) MCG/ACT inhaler Inhale 2 puffs into the lungs Every 4 hours as needed for wheezing or shortness of breath.     . verapamil (CALAN-SR) 240 MG CR tablet Take 240 mg by mouth daily.      . magnesium oxide (MAG-OX) 400 MG tablet Take 400 mg by mouth daily.    Marland Kitchen  omeprazole (PRILOSEC) 20 MG capsule Take 20 mg by mouth Daily.      No facility-administered medications prior to visit.    PAST MEDICAL HISTORY: Past Medical History  Diagnosis Date  . Hearing impairment   . Migraines     takes verapamil   . Colon polyps   . Arthritis   . Depression   . COPD (chronic obstructive pulmonary disease) ([place])   . Shortness of breath   . GERD (gastroesophageal reflux disease)   . DDD (degenerative disc disease), cervical   . DDD (degenerative disc disease), lumbar   . Right knee DJD   . Blurred  vision, right eye     constricted blood vessel; takes verapamil with good results  . Benign prostatic hypertrophy with urinary frequency   . Pneumonia 2013  . Thyroid mass   . Colon polyp     PAST SURGICAL HISTORY: Past Surgical History  Procedure Laterality Date  . Shoulder arthroscopy      left  . Cataract extraction      Right  . Elbow arthroscopy      left  . Colonoscopy  10/03/2011    Procedure: COLONOSCOPY;  Surgeon: Jamesetta So;  [facility];  Service: Gastroenterology;  Laterality: N/A;  . Hernia repair      rih  . Cataract extraction w/phaco  12/16/2012    Procedure: CATARACT EXTRACTION PHACO AND INTRAOCULAR LENS PLACEMENT (IOC);  Surgeon: Tonny Branch, MD;  Location: [facility];  Service: Ophthalmology;  Laterality: Left;  CDE:17.31  . Knee arthroscopy Right 2015  . Back surgery    . Posterior lumbar fusion  2014  . Shoulder arthroscopy Left 2000  . Eye surgery    . Thyroid lobectomy Right 01/20/2015    dr Benjamine Mola  . Thyroidectomy Right 01/20/2015    Procedure: RIGHT THYROIDECTOMY;  Surgeon: Ascencion Dike, MD;  Location: [facility];  Service: ENT;  Laterality: Right;  . Tee without cardioversion N/A 07/27/2015    Procedure: TRANSESOPHAGEAL ECHOCARDIOGRAM (TEE);  Surgeon: Lelon Perla, MD;  Location: [facility];  Service: Cardiovascular;  Laterality: N/A;  . Ep implantable device N/A 08/10/2015    Procedure: Loop Recorder Insertion;  Surgeon: Thompson Grayer, MD;  Location: [facility];  Service: Cardiovascular;  Laterality: N/A;    FAMILY HISTORY: Family History  Problem Relation Age of Onset  . Stroke Sister   . Breast cancer Sister   . Stroke Brother     SOCIAL HISTORY:  Social History   Social History  . Marital Status: Widowed    Spouse Name: N/A  . Number of Children: 3  . Years of Education: 14   Occupational History  . [employer] and [employer]     retired   Social History Main Topics  . Smoking status: Former Smoker -- 1.00 packs/day for 45  years    Types: Cigarettes    Quit date: 09/26/2010  . Smokeless tobacco: Never Used  . Alcohol Use: No  . Drug Use: No  . Sexual Activity: No   Other Topics Concern  . Not on file   Social History Narrative   Widowed, lives with dog, [place] in [place], wife passed from Parkinson's disease 3 1/2 yrs ago   1 cup coffee daily     PHYSICAL EXAM  GENERAL EXAM/CONSTITUTIONAL: Vitals:  Filed Vitals:   11/08/15 1353  BP: 108/64  Pulse: 82  Height: 6\' 1"  (1.854 m)  Weight: 200 lb 3.2 oz (90.81 kg)  Body mass index is 26.42 kg/(m^2). 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  awake, alert, oriented to person, place and time  recent and remote memory intact  normal attention and concentration  language fluent, comprehension intact, naming intact,   fund of knowledge appropriate  CRANIAL NERVE:   2nd - no papilledema on fundoscopic exam  2nd, 3rd, 4th, 6th - pupils equal and reactive to light, visual fields full to confrontation, extraocular muscles intact, no nystagmus  5th - facial sensation symmetric  7th - facial strength symmetric  8th - hearing intact  9th - palate elevates symmetrically, uvula midline  11th - shoulder shrug symmetric  12th - tongue protrusion midline  MOTOR:   normal bulk and tone, full strength in the BUE, BLE; EXCEPT ATROPHY OF BILATERAL  INTRINSIC HAND MUSCLES  SENSORY:   normal and symmetric to light touch, temperature, vibration; EXCEPT DECR VIB IN RIGHT FOOT  COORDINATION:   finger-nose-finger, fine finger movements normal  REFLEXES:   deep tendon reflexes present and symmetric  GAIT/STATION:   narrow based gait; ANTALGIC, LIMPING, DUE TO KNEE AND BACK PAIN; CANNOT WALK TOE, HEEL OR TANDEM; ROMBERG POSITIVE    DIAGNOSTIC DATA (LABS, IMAGING, TESTING) - I reviewed patient records, labs, notes, testing and imaging myself where available.  Lab Results  Component Value Date   WBC 4.7 01/12/2015   HGB 13.6 01/12/2015   HCT 42.6 01/12/2015   MCV 96.2 01/12/2015   PLT 189 01/12/2015      Component Value Date/Time   NA 141 01/12/2015 1106   K 4.4 01/12/2015 1106   CL 107 01/12/2015 1106   CO2 29 01/12/2015 1106   GLUCOSE 90 01/12/2015 1106   BUN 22 06/09/2015 1051   BUN 20 01/12/2015 1106   CREATININE 1.49* 06/09/2015 1051   CALCIUM 9.4 01/12/2015 1106   GFRNONAA 45* 06/09/2015 1051   GFRAA 52* 06/09/2015 1051   Lab Results  Component Value Date   CHOL 225* 06/09/2015   HDL 68 06/09/2015   LDLCALC 137* 06/09/2015   TRIG 100 06/09/2015   CHOLHDL 3.3 06/09/2015   Lab Results  Component Value Date   HGBA1C 6.0* 06/09/2015   No results found for: VITAMINB12 No results found for: TSH   03/07/13 TTE  - Left ventricle: The cavity size was normal. Wall thickness was increased in a pattern of mild LVH. Systolic functionwas normal. The estimated ejection fraction was in therange of 55% to 60%. - Aortic valve: AV is thickened, calcified with minimallyrestricted motion Peak and mean gradients through thevalve are 11 and 6mm Hg respectively. Mild regurgitation. - Pulmonary arteries: PA peak pressure: 34mm Hg (S).  10/29/14 carotid u/s - No focal hemodynamically significant stenosis is noted. Incidental note is made of a right thyroid nodule. Full thyroid ultrasound may be helpful for further  evaluation and to delineate any other thyroid nodules.  04/20/15 MRI brain [I reviewed images myself and agree with interpretation. Small right cerebellar  linear lesion, possible chronic lacunar infarct vs cerebellar folia. -VRP]  1.No acute intracranial abnormality. 2. Mild for age signal changes in the brain compatible with chronic small vessel disease.  06/21/15 MRA head - normal  06/21/15 MRA neck - normal  06/10/15 TEE - estimated ejection fraction was in the range of 60% to 65%. Wall motion was normal;there were no regional wall motion abnormalities. Compared to a prior echo in 2015, there are few changes. RVSP is lower at 29 mmHg.     ASSESSMENT AND PLAN  74 y.o. year old male here with episode of right amaurosis fugax in November 2015, and 3 episodes of nausea and vertigo in April 2016. 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 This combination of symptoms raises possibility for cardio embolic source. MRA head/neck and TTE are unremarkable.  Dx: cryptogenic TIA vs cardiac arrhythmia vs dysautonomia   PLAN: I spent 25 minutes of face to face time with patient. Greater than 50% of time was spent in counseling and coordination of care with patient. In summary we discussed:  - follow up implanted loop recorder - continue aspirin 81mg  and statin - stroke risk factor reduction, signs and symptoms, education reviewed - follow up with PCP and cardiology re: syncope attack on 11/03/15  Return in about 6 months (around 05/07/2016).    Penni Bombard, MD [id number], [id number] PM Certified in Neurology, Neurophysiology and [facility] [address] [place], [place] [address] [phone]

## 2015-11-11 DIAGNOSIS — L723 Sebaceous cyst: Secondary | ICD-10-CM | POA: Diagnosis not present

## 2015-11-25 DIAGNOSIS — L723 Sebaceous cyst: Secondary | ICD-10-CM | POA: Diagnosis not present

## 2015-12-02 ENCOUNTER — Telehealth: Payer: Self-pay | Admitting: Internal Medicine

## 2015-12-02 DIAGNOSIS — I480 Paroxysmal atrial fibrillation: Secondary | ICD-10-CM

## 2015-12-02 NOTE — Telephone Encounter (Signed)
°  1. Has your device fired?no   2. Is you device beeping? no  3. Are you experiencing draining or swelling at device site? no  4. Are you calling to see if we received your device transmission? no  5. Have you passed out? No  Pt want to know what does he need to do to perform his remote check.

## 2015-12-02 NOTE — Telephone Encounter (Signed)
LMOVM for pt to return call 

## 2015-12-03 ENCOUNTER — Other Ambulatory Visit (HOSPITAL_COMMUNITY): Payer: Self-pay | Admitting: Internal Medicine

## 2015-12-03 ENCOUNTER — Ambulatory Visit (HOSPITAL_COMMUNITY)
Admission: RE | Admit: 2015-12-03 | Discharge: 2015-12-03 | Disposition: A | Discharge status: Home / Self Care | Payer: Medicare Other | Source: Ambulatory Visit | Attending: Internal Medicine | Admitting: Internal Medicine

## 2015-12-03 DIAGNOSIS — R0602 Shortness of breath: Secondary | ICD-10-CM | POA: Diagnosis not present

## 2015-12-03 DIAGNOSIS — Z8701 Personal history of pneumonia (recurrent): Secondary | ICD-10-CM

## 2015-12-03 DIAGNOSIS — J90 Pleural effusion, not elsewhere classified: Secondary | ICD-10-CM | POA: Diagnosis not present

## 2015-12-03 DIAGNOSIS — I2510 Atherosclerotic heart disease of native coronary artery without angina pectoris: Secondary | ICD-10-CM | POA: Diagnosis not present

## 2015-12-03 DIAGNOSIS — R05 Cough: Secondary | ICD-10-CM | POA: Diagnosis not present

## 2015-12-03 DIAGNOSIS — IMO0002 Reserved for concepts with insufficient information to code with codable children: Secondary | ICD-10-CM

## 2015-12-03 DIAGNOSIS — I700 Atherosclerosis of aorta: Secondary | ICD-10-CM | POA: Diagnosis not present

## 2015-12-03 NOTE — Telephone Encounter (Signed)
Instructed pt that his appt on 12-08-15 was a remote transmission and that his home monitor will send the transmission between 12 Midnight and 5 AM. Pt wants to know if he had any episodes on 11-05-15 around 11:30 AM. He stated that he passed out during an xray. Informed him that I would let device tech know and have her call him back in regards to this episode. He verbalized understanding.

## 2015-12-03 NOTE — Telephone Encounter (Signed)
LMOM requesting call back.  Gave device clinic phone number. 

## 2015-12-06 ENCOUNTER — Ambulatory Visit (HOSPITAL_COMMUNITY)
Admission: RE | Admit: 2015-12-06 | Discharge: 2015-12-06 | Disposition: A | Discharge status: Home / Self Care | Payer: Medicare Other | Source: Ambulatory Visit | Attending: Internal Medicine | Admitting: Internal Medicine

## 2015-12-06 DIAGNOSIS — I712 Thoracic aortic aneurysm, without rupture: Secondary | ICD-10-CM | POA: Diagnosis not present

## 2015-12-06 DIAGNOSIS — R918 Other nonspecific abnormal finding of lung field: Secondary | ICD-10-CM | POA: Insufficient documentation

## 2015-12-06 DIAGNOSIS — IMO0002 Reserved for concepts with insufficient information to code with codable children: Secondary | ICD-10-CM

## 2015-12-06 DIAGNOSIS — R911 Solitary pulmonary nodule: Secondary | ICD-10-CM | POA: Diagnosis not present

## 2015-12-06 LAB — POCT I-STAT CREATININE: Creatinine, Ser: 1.5 mg/dL — ABNORMAL HIGH (ref 0.61–1.24)

## 2015-12-06 MED ORDER — IOHEXOL 300 MG/ML  SOLN
60.0000 mL | Freq: Once | INTRAMUSCULAR | Status: AC | PRN
Start: 2015-12-06 — End: 2015-12-06
  Administered 2015-12-06: 60 mL via INTRAVENOUS

## 2015-12-08 ENCOUNTER — Ambulatory Visit (INDEPENDENT_AMBULATORY_CARE_PROVIDER_SITE_OTHER): Payer: Medicare Other | Admitting: *Deleted

## 2015-12-08 DIAGNOSIS — I639 Cerebral infarction, unspecified: Secondary | ICD-10-CM

## 2015-12-08 NOTE — Progress Notes (Signed)
Carelink Summary Report / Loop Recorder 

## 2015-12-13 LAB — CUP PACEART REMOTE DEVICE CHECK: Date Time Interrogation Session: 20161114120553

## 2015-12-14 NOTE — Telephone Encounter (Signed)
Second attempt:  [facility] requesting call back.  Gave device clinic phone number.

## 2015-12-16 NOTE — Telephone Encounter (Signed)
Able to reach patient.  He states he will call back tomorrow as he is working with a customer right now.  [place] office phone number.  He verbalizes appreciation.

## 2015-12-17 ENCOUNTER — Telehealth: Payer: Self-pay | Admitting: *Deleted

## 2015-12-17 ENCOUNTER — Telehealth: Payer: Self-pay | Admitting: Internal Medicine

## 2015-12-17 DIAGNOSIS — I480 Paroxysmal atrial fibrillation: Secondary | ICD-10-CM | POA: Insufficient documentation

## 2015-12-17 MED ORDER — APIXABAN 5 MG PO TABS: 5.0000 mg | ORAL_TABLET | Freq: Two times a day (BID) | ORAL | Status: DC

## 2015-12-17 NOTE — Telephone Encounter (Signed)
New message      Calling to talk to [facility]

## 2015-12-17 NOTE — Telephone Encounter (Addendum)
Reviewed episode with Dr. Lovena Le, who agreed that episode strip shows atrial fibrillation.  Dr. Rayann Heman made aware, ordered Eliquis 5mg  BID and discontinued ASA.  Called [place] office to ask that they set samples aside for patient.  Prescription sent to patient's pharmacy electronically.  Patient aware and agreeable to plan.  Gave him [place] office address and he will pick up samples today before the office closes at noon.  Advised patient that AF clinic will be contacting him next week to set up an appointment.  Patient is appreciative of call and denies additional questions at this time.  He is aware to call with worsening symptoms or concerns.

## 2015-12-17 NOTE — Telephone Encounter (Signed)
Patient given 2 boxes of Eliquis samples Lot # [id number]  EXP: 5/19, and 30 day free trial card.

## 2015-12-17 NOTE — Telephone Encounter (Signed)
Returned patient's call.  Patient states he is getting married next weekend and he is anxiously anticipating this.  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  Patient states Dr. Willey Blade, his PCP, is following this.  Patient wanted to know if he has had any episodes on his LINQ.  Advised patient that he had a possible episode yesterday, but that if he can send manual transmission for further review, it would be helpful for Dr. Rayann Heman in determining whether the episode is an arrhythmia.  Patient verbalizes understanding and states he will send one tonight.  Patient is aware that Dr. Rayann Heman will review the episode and that I will call him back with any additional recommendations.  He denies additional questions at this time.

## 2015-12-17 NOTE — Telephone Encounter (Signed)
See phone encounter from 12/02/15.

## 2015-12-23 ENCOUNTER — Ambulatory Visit (HOSPITAL_COMMUNITY)
Admission: RE | Admit: 2015-12-23 | Discharge: 2015-12-23 | Disposition: A | Discharge status: Home / Self Care | Payer: Medicare Other | Source: Ambulatory Visit | Attending: Nurse Practitioner | Admitting: Nurse Practitioner

## 2015-12-23 VITALS — BP 144/82 | HR 65 | Ht 73.0 in | Wt 209.6 lb

## 2015-12-23 DIAGNOSIS — I480 Paroxysmal atrial fibrillation: Secondary | ICD-10-CM | POA: Diagnosis not present

## 2015-12-24 ENCOUNTER — Encounter (HOSPITAL_COMMUNITY): Payer: Self-pay | Admitting: Nurse Practitioner

## 2015-12-24 NOTE — Progress Notes (Signed)
Patient ID: Kyle Owen, male   DOB: 04-28-1941, 74 y.o.   MRN: [id number]     Primary Care Physician: Asencion Noble, MD Referring Physician:Device clinic   Kyle Owen is a 74 y.o. male with a h/o TIA symptoms in 10/2014 and again in 2016, that received a linq 8/16 that recently showed an episode of PAF. Pt was asynptomatic. He has been started on eliquis about a week ago and is taking without issues.He was previously on verapamil for ocular migraines but is also beneficial for rate control. His episode of afib was not sustained.   Today, he denies symptoms of palpitations, chest pain, shortness of breath, orthopnea, PND, lower extremity edema, dizziness, presyncope, syncope, or neurologic sequela. The patient is tolerating medications without difficulties and is otherwise without complaint today.   Past Medical History  Diagnosis Date  . Hearing impairment   . Migraines     takes verapamil   . Colon polyps   . Arthritis   . Depression   . COPD (chronic obstructive pulmonary disease) ([place])   . Shortness of breath   . GERD (gastroesophageal reflux disease)   . DDD (degenerative disc disease), cervical   . DDD (degenerative disc disease), lumbar   . Right knee DJD   . Blurred vision, right eye     constricted blood vessel; takes verapamil with good results  . Benign prostatic hypertrophy with urinary frequency   . Pneumonia 2013  . Thyroid mass   . Colon polyp    Past Surgical History  Procedure Laterality Date  . Shoulder arthroscopy      left  . Cataract extraction      Right  . Elbow arthroscopy      left  . Colonoscopy  10/03/2011    Procedure: COLONOSCOPY;  Surgeon: Jamesetta So;  [facility];  Service: Gastroenterology;  Laterality: N/A;  . Hernia repair      rih  . Cataract extraction w/phaco  12/16/2012    Procedure: CATARACT EXTRACTION PHACO AND INTRAOCULAR LENS PLACEMENT (IOC);  Surgeon: Tonny Branch, MD;  [facility];  Service: Ophthalmology;   Laterality: Left;  CDE:17.31  . Knee arthroscopy Right 2015  . Back surgery    . Posterior lumbar fusion  2014  . Shoulder arthroscopy Left 2000  . Eye surgery    . Thyroid lobectomy Right 01/20/2015    dr Benjamine Mola  . Thyroidectomy Right 01/20/2015    Procedure: RIGHT THYROIDECTOMY;  Surgeon: Ascencion Dike, MD;  Location: [facility];  Service: ENT;  Laterality: Right;  . Tee without cardioversion N/A 07/27/2015    Procedure: TRANSESOPHAGEAL ECHOCARDIOGRAM (TEE);  Surgeon: Lelon Perla, MD;  Location: [facility];  Service: Cardiovascular;  Laterality: N/A;  . Ep implantable device N/A 08/10/2015    Procedure: Loop Recorder Insertion;  Surgeon: Thompson Grayer, MD;  Location: [facility];  Service: Cardiovascular;  Laterality: N/A;    Current Outpatient Prescriptions  Medication Sig Dispense Refill  . acetaminophen (TYLENOL) 500 MG tablet Take 500 mg by mouth every 6 (six) hours as needed for moderate pain.     Marland Kitchen apixaban (ELIQUIS) 5 MG TABS tablet Take 1 tablet (5 mg total) by mouth 2 (two) times daily. 60 tablet 11  . budesonide-formoterol (SYMBICORT) 160-4.5 MCG/ACT inhaler Inhale 2 puffs into the lungs 2 (two) times daily.    . finasteride (PROSCAR) 5 MG tablet Take 5 mg by mouth daily.    . Multiple Vitamins-Minerals (CENTRUM SILVER PO) Take  by mouth.    . sertraline (ZOLOFT) 50 MG tablet Take 50 mg by mouth daily.      . simvastatin (ZOCOR) 20 MG tablet Take 1 tablet (20 mg total) by mouth daily at 6 PM. 30 tablet 3  . verapamil (CALAN-SR) 240 MG CR tablet Take 240 mg by mouth daily.      . VENTOLIN HFA 108 (90 BASE) MCG/ACT inhaler Inhale 2 puffs into the lungs Every 4 hours as needed for wheezing or shortness of breath. Reported on 12/23/2015     No current facility-administered medications for this encounter.    Allergies  Allergen Reactions  . Flomax [Tamsulosin Hcl] Other (See Comments)    Pt thought he was dying    Social History   Social History  . Marital Status: Widowed     Spouse Name: N/A  . Number of Children: 3  . Years of Education: 14   Occupational History  . [employer] and [employer]     retired   Social History Main Topics  . Smoking status: Former Smoker -- 1.00 packs/day for 45 years    Types: Cigarettes    Quit date: 09/26/2010  . Smokeless tobacco: Never Used  . Alcohol Use: No  . Drug Use: No  . Sexual Activity: No   Other Topics Concern  . Not on file   Social History Narrative   Widowed, lives with dog, [place] in [place], wife passed from Parkinson's disease 3 1/2 yrs ago   1 cup coffee daily    Family History  Problem Relation Age of Onset  . Stroke Sister   . Breast cancer Sister   . Stroke Brother     ROS- All systems are reviewed and negative except as per the HPI above  Physical Exam: Filed Vitals:   12/23/15 0909  BP: 144/82  Pulse: 65  Height: 6\' 1"  (1.854 m)  Weight: 209 lb 9.6 oz (95.074 kg)    GEN- The patient is well appearing, alert and oriented x 3 today.   Head- normocephalic, atraumatic Eyes-  Sclera clear, conjunctiva pink Ears- hearing intact Oropharynx- clear Neck- supple, no JVP Lymph- no cervical lymphadenopathy Lungs- Clear to ausculation bilaterally, normal work of breathing Heart- Regular rate and rhythm, no murmurs, rubs or gallops, PMI not laterally displaced GI- soft, NT, ND, + BS Extremities- no clubbing, cyanosis, or edema MS- no significant deformity or atrophy Skin- no rash or lesion Psych- euthymic mood, full affect Neuro- strength and sensation are intact  EKG-NSR, v rate at 65 bpm, pr int 168 ms, qrs int 76 ms, qtc 403 ms Epic records reviewed Linq strips reviewed  Assessment and Plan: 1.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 Mercedes Valeriano, [facility] [address] [place], [place] [address] [phone]

## 2016-01-03 ENCOUNTER — Other Ambulatory Visit (HOSPITAL_COMMUNITY): Payer: Self-pay | Admitting: Internal Medicine

## 2016-01-03 DIAGNOSIS — IMO0002 Reserved for concepts with insufficient information to code with codable children: Secondary | ICD-10-CM

## 2016-01-05 ENCOUNTER — Other Ambulatory Visit: Payer: Self-pay | Admitting: Diagnostic Neuroimaging

## 2016-01-07 ENCOUNTER — Encounter: Payer: Self-pay | Admitting: Internal Medicine

## 2016-01-07 ENCOUNTER — Ambulatory Visit (INDEPENDENT_AMBULATORY_CARE_PROVIDER_SITE_OTHER): Payer: Medicare Other | Admitting: *Deleted

## 2016-01-07 DIAGNOSIS — I639 Cerebral infarction, unspecified: Secondary | ICD-10-CM

## 2016-01-10 ENCOUNTER — Ambulatory Visit (HOSPITAL_COMMUNITY)
Admission: RE | Admit: 2016-01-10 | Discharge: 2016-01-10 | Disposition: A | Discharge status: Home / Self Care | Payer: Medicare Other | Source: Ambulatory Visit | Attending: Internal Medicine | Admitting: Internal Medicine

## 2016-01-10 DIAGNOSIS — I712 Thoracic aortic aneurysm, without rupture: Secondary | ICD-10-CM | POA: Insufficient documentation

## 2016-01-10 DIAGNOSIS — IMO0002 Reserved for concepts with insufficient information to code with codable children: Secondary | ICD-10-CM

## 2016-01-10 DIAGNOSIS — R918 Other nonspecific abnormal finding of lung field: Secondary | ICD-10-CM | POA: Diagnosis not present

## 2016-01-10 DIAGNOSIS — Z79899 Other long term (current) drug therapy: Secondary | ICD-10-CM | POA: Diagnosis not present

## 2016-01-10 DIAGNOSIS — J432 Centrilobular emphysema: Secondary | ICD-10-CM | POA: Diagnosis not present

## 2016-01-10 DIAGNOSIS — I2510 Atherosclerotic heart disease of native coronary artery without angina pectoris: Secondary | ICD-10-CM | POA: Insufficient documentation

## 2016-01-10 DIAGNOSIS — G463 Brain stem stroke syndrome: Secondary | ICD-10-CM | POA: Diagnosis not present

## 2016-01-10 DIAGNOSIS — J189 Pneumonia, unspecified organism: Secondary | ICD-10-CM | POA: Insufficient documentation

## 2016-01-10 DIAGNOSIS — N529 Male erectile dysfunction, unspecified: Secondary | ICD-10-CM | POA: Diagnosis not present

## 2016-01-10 DIAGNOSIS — E785 Hyperlipidemia, unspecified: Secondary | ICD-10-CM | POA: Diagnosis not present

## 2016-01-10 NOTE — Progress Notes (Signed)
Carelink Summary Report / Loop Recorder 

## 2016-01-17 ENCOUNTER — Encounter: Payer: Self-pay | Admitting: *Deleted

## 2016-01-17 ENCOUNTER — Other Ambulatory Visit (HOSPITAL_COMMUNITY): Payer: Self-pay | Admitting: Internal Medicine

## 2016-01-17 ENCOUNTER — Encounter: Payer: Medicare Other | Admitting: Cardiothoracic Surgery

## 2016-01-17 DIAGNOSIS — R3129 Other microscopic hematuria: Secondary | ICD-10-CM

## 2016-01-17 DIAGNOSIS — F3340 Major depressive disorder, recurrent, in remission, unspecified: Secondary | ICD-10-CM | POA: Diagnosis not present

## 2016-01-17 DIAGNOSIS — I712 Thoracic aortic aneurysm, without rupture, unspecified: Secondary | ICD-10-CM | POA: Insufficient documentation

## 2016-01-17 DIAGNOSIS — J449 Chronic obstructive pulmonary disease, unspecified: Secondary | ICD-10-CM | POA: Diagnosis not present

## 2016-01-17 DIAGNOSIS — I4891 Unspecified atrial fibrillation: Secondary | ICD-10-CM | POA: Diagnosis not present

## 2016-01-17 DIAGNOSIS — Z6827 Body mass index (BMI) 27.0-27.9, adult: Secondary | ICD-10-CM | POA: Diagnosis not present

## 2016-01-18 ENCOUNTER — Encounter: Payer: Self-pay | Admitting: Cardiothoracic Surgery

## 2016-01-18 ENCOUNTER — Institutional Professional Consult (permissible substitution) (INDEPENDENT_AMBULATORY_CARE_PROVIDER_SITE_OTHER): Payer: Medicare Other | Admitting: Cardiothoracic Surgery

## 2016-01-18 VITALS — BP 116/72 | HR 66 | Resp 16 | Ht 73.0 in | Wt 206.0 lb

## 2016-01-18 DIAGNOSIS — I712 Thoracic aortic aneurysm, without rupture, unspecified: Secondary | ICD-10-CM

## 2016-01-18 DIAGNOSIS — I639 Cerebral infarction, unspecified: Secondary | ICD-10-CM

## 2016-01-18 NOTE — Progress Notes (Signed)
PCP is Asencion Noble, MD Referring Provider is Asencion Noble, MD  Chief Complaint  Patient presents with  . 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 There is no ulceration or intramural hematoma involved. The arch and descending thoracic aorta appeared to be normal size. The patient is asymptomatic. There's no family history of thoracic or abdominal aortic aneurysm disease. There's no family history of sudden death suggestive  of dissection.The patient does not have hypertension.  The patient had a TEE performed this past summer when he developed paroxysmal atrial fibrillation. LV function was normal. There is trivial aortic insufficiency and the aortic root measured 4.4 cm. The aortic valve was tricuspid  Last month the patient developed right lower lobe bronchopneumonia which was demonstrated on CT scan. The patient was treated with oral antibiotics-Z-Pak and Levaquin with clinical improvement. Followup chest x-ray showed significant improvement but not complete resolution of the right lower lobe infiltrate. Both CT scan showed the fusiform ascending 4.8 cm aneurysm.  The patient also has some very small 3-4 mm bilateral pulmonary nodules.  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 He goes to the fitness center at least twice a week to improve core strength to help his spine and does not do heavy lifting. Maximum of 25-30 pounds.  The patient had a TIA this past summer. He had TEE. He states he had a brain scan that showed some any strokes. A loop recorder demonstrated paroxysmal atrial  fibrillation he was started on Eliquis  Patient has symptoms of COPD with exertional dyspnea without chest pain. He has had pneumonia twice in the past few years. He takes Symbicort.   Past Medical History  Diagnosis Date  . Hearing impairment   . Migraines     takes verapamil   . Colon polyps   . Arthritis   . Depression   . COPD (chronic obstructive pulmonary disease) ([place])   . Shortness of breath   . GERD (gastroesophageal reflux disease)   . DDD (degenerative disc disease), cervical   . DDD (degenerative disc disease), lumbar   . Right knee DJD   . Blurred vision, right eye     constricted blood vessel; takes verapamil with good results  . Benign prostatic hypertrophy with urinary frequency   . Pneumonia 2013  . Thyroid mass   . Colon polyp   . Thoracic aortic aneurysm without rupture ([place])     ct chest 01/10/16    Past Surgical History  Procedure Laterality Date  . Shoulder arthroscopy      left  . Cataract extraction      Right  . Elbow arthroscopy      left  . Colonoscopy  10/03/2011    Procedure: COLONOSCOPY;  Surgeon: Jamesetta So;  [facility];  Service: Gastroenterology;  Laterality: N/A;  . Hernia repair      rih  . Cataract extraction w/phaco  12/16/2012    Procedure: CATARACT EXTRACTION PHACO AND INTRAOCULAR LENS PLACEMENT (IOC);  Surgeon: Tonny Branch, MD;  Location: [facility];  Service: Ophthalmology;  Laterality: Left;  CDE:17.31  . Knee arthroscopy Right  2015  . Back surgery    . Posterior lumbar fusion  2014  . Shoulder arthroscopy Left 2000  . Eye surgery    . Thyroid lobectomy Right 01/20/2015    dr Benjamine Mola  . Thyroidectomy Right 01/20/2015    Procedure: RIGHT THYROIDECTOMY;  Surgeon: Ascencion Dike, MD;  Location: [place];  Service: ENT;  Laterality: Right;  . Tee without cardioversion N/A 07/27/2015    Procedure: TRANSESOPHAGEAL ECHOCARDIOGRAM (TEE);  Surgeon: Lelon Perla, MD;  Location: [place];  Service: Cardiovascular;  Laterality:  N/A;  . Ep implantable device N/A 08/10/2015    Procedure: Loop Recorder Insertion;  Surgeon: Thompson Grayer, MD;  Location: [facility];  Service: Cardiovascular;  Laterality: N/A;    Family History  Problem Relation Age of Onset  . Stroke Sister   . Breast cancer Sister   . Stroke Brother     Social History Social History  Substance Use Topics  . Smoking status: Former Smoker -- 1.00 packs/day for 45 years    Types: Cigarettes    Quit date: 09/26/2010  . Smokeless tobacco: Never Used  . Alcohol Use: No    Current Outpatient Prescriptions  Medication Sig Dispense Refill  . acetaminophen (TYLENOL) 500 MG tablet Take 1,000 mg by mouth daily.     Marland Kitchen apixaban (ELIQUIS) 5 MG TABS tablet Take 1 tablet (5 mg total) by mouth 2 (two) times daily. 60 tablet 11  . budesonide-formoterol (SYMBICORT) 160-4.5 MCG/ACT inhaler Inhale 2 puffs into the lungs 2 (two) times daily.    . finasteride (PROSCAR) 5 MG tablet Take 5 mg by mouth daily.    . Multiple Vitamins-Minerals (CENTRUM SILVER PO) Take by mouth.    . sertraline (ZOLOFT) 50 MG tablet Take 50 mg by mouth daily.      . simvastatin (ZOCOR) 20 MG tablet TAKE 1 TABLET BY MOUTH DAILY AT 6 EVENING. 30 tablet 1  . VENTOLIN HFA 108 (90 BASE) MCG/ACT inhaler Inhale 2 puffs into the lungs Every 4 hours as needed for wheezing or shortness of breath. Reported on 12/23/2015    . verapamil (CALAN-SR) 240 MG CR tablet Take 240 mg by mouth daily.       No current facility-administered medications for this visit.    Allergies  Allergen Reactions  . Flomax [Tamsulosin Hcl] Other (See Comments)    Pt thought he was dying    Review of Systems   The patient has orthostatic dizziness-hypertension probably related to finasteride to help with BPH. The patient denies any thoracic trauma or spontaneous pneumothorax He  has never had hemoptysis He is left-hand dominant He is  semi-retired as a gunsmith His wife of 36 years died from Parkinson's  disease 3-4 years ago and he recently remarried. No history of cancer-partial thyroidectomy for benign nodules The patient takes verapamil for migraine-induced visual problems The patient has atrial fibrillation recent diagnosed by loop recorder and currently takes  Eliquis       Review of Systems :  [ y ] = yes, [  ] = no        General :  Weight gain [   ]    Weight loss  [   ]  Fatigue [  ]  Fever [  ]  Chills  [  ]  Weakness  [  ]           Cardiac :  Chest pain/ pressure [  ]  Resting SOB [  ] exertional SOB [ yes- when he walks fast ]                        Orthopnea [  ]  Pedal edema  [  ]  Palpitations [  ] Syncope/presyncope [place].Spore ]                        Paroxysmal nocturnal dyspnea [  ]        Pulmonary : cough [has intermittent  ]  wheezing [  ]  Hemoptysis [  ] Sputum [  ] Snoring [  ]                              Pneumothorax [  ]  Sleep apnea [  ]       GI : Vomiting [  ]  Dysphagia [  ]  Melena  [  ]  Abdominal pain [  ] BRBPR [  ]              Heart burn [  ]  Constipation [  ] Diarrhea  [  ] Colonoscopy [  ]       GU : Hematuria [  ]  Dysuria [  ]  Nocturia [ yes ] UTI's [  ]positive for BPH       Vascular : Claudication [  ]  Rest pain [  ]  DVT [  ] Vein stripping [  ] leg ulcers [  ]                          TIA [  ] Stroke [ yes mini strokes from A. fib ]  Varicose veins [  ]       NEURO :  Headaches  [ yes ] Seizures [  ] Vision changes [yes-improved on verapamil  ] Paresthesias [  ]       Musculoskeletal :  Arthritis [  ] Gout  [  ]  Back pain [yes lumbar fusion  ]  Joint pain [  ]       Skin :  Rash [  ]  Melanoma [  ]        Heme : Bleeding problems [  ]Clotting Disorders [  ] Anemia [  ]Blood Transfusion [ ] positive for easy bruising       Endocrine : Diabetes [no  ] Thyroid Disorder  [ he has prior surgery ]       Psych : Depression [  ]  Anxiety [  ]  Psych hospitalizations [  ]                                                  BP 116/72 mmHg  Pulse 66  Resp 16  Ht 6\' 1"  (1.854 m)  Wt 206 lb (93.441 kg)  BMI 27.18 kg/m2  SpO2 95% Physical Exam       Physical Exam  General: pleasant well-nourished 75 year old Caucasian male no distress very alert  and well spoken HEENT: Normocephalic pupils equal , dentition adequate Neck: Supple without JVD, adenopathy, or bruit Chest: Clear to auscultation, symmetrical breath sounds, no rhonchi, no tenderness             or deformity Cardiovascular: Regular rate and rhythm, no murmur, no gallop, peripheral pulses             palpable in all extremities Abdomen:  Soft, nontender, no palpable mass or organomegaly Extremities: Warm, well-perfused, no clubbing cyanosis edema or tenderness,              no venous stasis changes of the legs Rectal/GU: Deferred Neuro: Grossly non--focal and symmetrical throughout Skin: Clean and dry without rash or ulceration   Diagnostic Tests: TEE, CT scans performed December and January all personally reviewed and counseled the patient  Impression: 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 This is most probably chronic and related to his long history of smoking. The therapy is blood pressure control-he is on verapamil, nonsmoking with  which he has succeeded, and lipid control-currently on Zocor. Surgery not indicated unless diameter exceeds 5.5 cm according to guidelines. At 4.8 cm his risk for aortic dissection is less than 2%.  The aorta will be monitored with serial CT scans. He'll return with a CTA of the thoracic aorta in 6 months. No change in medications or activities. The pulmonary infiltrates from his recent pneumonia will also be assessed.  Plan:return in 6 months with CTA of the thoracic aorta.   Len Childs, MD [facility] [phone]

## 2016-01-20 ENCOUNTER — Ambulatory Visit (HOSPITAL_COMMUNITY)
Admission: RE | Admit: 2016-01-20 | Discharge: 2016-01-20 | Disposition: A | Discharge status: Home / Self Care | Payer: Medicare Other | Source: Ambulatory Visit | Attending: Internal Medicine | Admitting: Internal Medicine

## 2016-01-20 DIAGNOSIS — N281 Cyst of kidney, acquired: Secondary | ICD-10-CM | POA: Insufficient documentation

## 2016-01-20 DIAGNOSIS — N261 Atrophy of kidney (terminal): Secondary | ICD-10-CM | POA: Diagnosis not present

## 2016-01-20 DIAGNOSIS — R3129 Other microscopic hematuria: Secondary | ICD-10-CM | POA: Insufficient documentation

## 2016-01-20 DIAGNOSIS — N400 Enlarged prostate without lower urinary tract symptoms: Secondary | ICD-10-CM | POA: Diagnosis not present

## 2016-01-22 LAB — CUP PACEART REMOTE DEVICE CHECK: Date Time Interrogation Session: 20161214123547

## 2016-01-31 ENCOUNTER — Telehealth (HOSPITAL_COMMUNITY): Payer: Self-pay | Admitting: *Deleted

## 2016-01-31 ENCOUNTER — Ambulatory Visit (HOSPITAL_COMMUNITY)
Admission: RE | Admit: 2016-01-31 | Discharge: 2016-01-31 | Disposition: A | Discharge status: Home / Self Care | Payer: Medicare Other | Source: Ambulatory Visit | Attending: Nurse Practitioner | Admitting: Nurse Practitioner

## 2016-01-31 VITALS — BP 140/82 | HR 78 | Ht 73.0 in | Wt 208.6 lb

## 2016-01-31 DIAGNOSIS — Z87891 Personal history of nicotine dependence: Secondary | ICD-10-CM | POA: Insufficient documentation

## 2016-01-31 DIAGNOSIS — Z86010 Personal history of colonic polyps: Secondary | ICD-10-CM | POA: Diagnosis not present

## 2016-01-31 DIAGNOSIS — F329 Major depressive disorder, single episode, unspecified: Secondary | ICD-10-CM | POA: Insufficient documentation

## 2016-01-31 DIAGNOSIS — Z79899 Other long term (current) drug therapy: Secondary | ICD-10-CM | POA: Insufficient documentation

## 2016-01-31 DIAGNOSIS — J449 Chronic obstructive pulmonary disease, unspecified: Secondary | ICD-10-CM | POA: Diagnosis not present

## 2016-01-31 DIAGNOSIS — J441 Chronic obstructive pulmonary disease with (acute) exacerbation: Secondary | ICD-10-CM | POA: Diagnosis not present

## 2016-01-31 DIAGNOSIS — Z7901 Long term (current) use of anticoagulants: Secondary | ICD-10-CM | POA: Diagnosis not present

## 2016-01-31 DIAGNOSIS — M1990 Unspecified osteoarthritis, unspecified site: Secondary | ICD-10-CM | POA: Diagnosis not present

## 2016-01-31 DIAGNOSIS — K219 Gastro-esophageal reflux disease without esophagitis: Secondary | ICD-10-CM | POA: Diagnosis not present

## 2016-01-31 DIAGNOSIS — Z8673 Personal history of transient ischemic attack (TIA), and cerebral infarction without residual deficits: Secondary | ICD-10-CM | POA: Insufficient documentation

## 2016-01-31 DIAGNOSIS — I480 Paroxysmal atrial fibrillation: Secondary | ICD-10-CM

## 2016-01-31 DIAGNOSIS — I712 Thoracic aortic aneurysm, without rupture: Secondary | ICD-10-CM | POA: Insufficient documentation

## 2016-01-31 DIAGNOSIS — M5030 Other cervical disc degeneration, unspecified cervical region: Secondary | ICD-10-CM | POA: Diagnosis not present

## 2016-01-31 NOTE — Telephone Encounter (Signed)
error 

## 2016-01-31 NOTE — Progress Notes (Signed)
Patient ID: Kyle Owen, male   DOB: 1941-12-10, 75 y.o.   MRN: [id number]        Primary Care Physician: Asencion Noble, MD Referring Physician:Device clinic Linq implantation MD: Dr. Johnanna Schneiders is a 75 y.o. male with a h/o TIA symptoms in 10/2014 and again in 2016, that received a linq 8/16 for neuro symptoms that recently showed an episode of PAF. Pt was asymptomatic. He has been taking eliquis for over a month now and is doing well without any bleeding issues. Marland KitchenHe was previously on verapamil for ocular migraines but is also beneficial for rate control. His afib burden is still low at 2.5% when reviewing linq reports.He has been diagnosed with AAA  At 4.8 and will be followed by Dr. Prescott Gum every 6 months.  Today, he denies symptoms of palpitations, chest pain, shortness of breath, orthopnea, PND, lower extremity edema, dizziness, presyncope, syncope, or neurologic sequela. The patient is tolerating medications without difficulties and is otherwise without complaint today.   Past Medical History  Diagnosis Date  . Hearing impairment   . Migraines     takes verapamil   . Colon polyps   . Arthritis   . Depression   . COPD (chronic obstructive pulmonary disease) ([place])   . Shortness of breath   . GERD (gastroesophageal reflux disease)   . DDD (degenerative disc disease), cervical   . DDD (degenerative disc disease), lumbar   . Right knee DJD   . Blurred vision, right eye     constricted blood vessel; takes verapamil with good results  . Benign prostatic hypertrophy with urinary frequency   . Pneumonia 2013  . Thyroid mass   . Colon polyp   . Thoracic aortic aneurysm without rupture ([place])     ct chest 01/10/16   Past Surgical History  Procedure Laterality Date  . Shoulder arthroscopy      left  . Cataract extraction      Right  . Elbow arthroscopy      left  . Colonoscopy  10/03/2011    Procedure: COLONOSCOPY;  Surgeon: Jamesetta So;  [facility];  Service: Gastroenterology;  Laterality: N/A;  . Hernia repair      rih  . Cataract extraction w/phaco  12/16/2012    Procedure: CATARACT EXTRACTION PHACO AND INTRAOCULAR LENS PLACEMENT (IOC);  Surgeon: Tonny Branch, MD;  Location: [facility];  Service: Ophthalmology;  Laterality: Left;  CDE:17.31  . Knee arthroscopy Right 2015  . Back surgery    . Posterior lumbar fusion  2014  . Shoulder arthroscopy Left 2000  . Eye surgery    . Thyroid lobectomy Right 01/20/2015    dr Benjamine Mola  . Thyroidectomy Right 01/20/2015    Procedure: RIGHT THYROIDECTOMY;  Surgeon: Ascencion Dike, MD;  Location: [place];  Service: ENT;  Laterality: Right;  . Tee without cardioversion N/A 07/27/2015    Procedure: TRANSESOPHAGEAL ECHOCARDIOGRAM (TEE);  Surgeon: Lelon Perla, MD;  Location: [place];  Service: Cardiovascular;  Laterality: N/A;  . Ep implantable device N/A 08/10/2015    Procedure: Loop Recorder Insertion;  Surgeon: Thompson Grayer, MD;  Location: [facility];  Service: Cardiovascular;  Laterality: N/A;    Current Outpatient Prescriptions  Medication Sig Dispense Refill  . acetaminophen (TYLENOL) 500 MG tablet Take 1,000 mg by mouth daily.     Marland Kitchen apixaban (ELIQUIS) 5 MG TABS tablet Take 1 tablet (5 mg total) by mouth 2 (two) times daily.  60 tablet 11  . budesonide-formoterol (SYMBICORT) 160-4.5 MCG/ACT inhaler Inhale 2 puffs into the lungs 2 (two) times daily.    . finasteride (PROSCAR) 5 MG tablet Take 5 mg by mouth daily.    . Multiple Vitamins-Minerals (CENTRUM SILVER PO) Take by mouth.    . sertraline (ZOLOFT) 50 MG tablet Take 50 mg by mouth daily.      . simvastatin (ZOCOR) 20 MG tablet TAKE 1 TABLET BY MOUTH DAILY AT 6 EVENING. 30 tablet 1  . VENTOLIN HFA 108 (90 BASE) MCG/ACT inhaler Inhale 2 puffs into the lungs Every 4 hours as needed for wheezing or shortness of breath. Reported on 12/23/2015    . verapamil (CALAN-SR) 240 MG CR tablet Take 240 mg by mouth daily.       No current  facility-administered medications for this encounter.    Allergies  Allergen Reactions  . Flomax [Tamsulosin Hcl] Other (See Comments)    Pt thought he was dying    Social History   Social History  . Marital Status: Widowed    Spouse Name: N/A  . Number of Children: 3  . Years of Education: 14   Occupational History  . [employer]     retired   Social History Main Topics  . Smoking status: Former Smoker -- 1.00 packs/day for 45 years    Types: Cigarettes    Quit date: 09/26/2010  . Smokeless tobacco: Never Used  . Alcohol Use: No  . Drug Use: No  . Sexual Activity: No   Other Topics Concern  . Not on file   Social History Narrative   Widowed, lives with dog, [place] in [place], wife passed from Parkinson's disease 3 1/2 yrs ago   1 cup coffee daily    Family History  Problem Relation Age of Onset  . Stroke Sister   . Breast cancer Sister   . Stroke Brother     ROS- All systems are reviewed and negative except as per the HPI above  Physical Exam: Filed Vitals:   01/31/16 0854  BP: 140/82  Pulse: 78  Height: 6\' 1"  (1.854 m)  Weight: 208 lb 9.6 oz (94.62 kg)    GEN- The patient is well appearing, alert and oriented x 3 today.   Head- normocephalic, atraumatic Eyes-  Sclera clear, conjunctiva pink Ears- hearing intact Oropharynx- clear Neck- supple, no JVP Lymph- no cervical lymphadenopathy Lungs- Clear to ausculation bilaterally, normal work of breathing Heart- regular rate and rhythm, no murmurs, rubs or gallops, PMI not laterally displaced GI- soft, NT, ND, + BS Extremities- no clubbing, cyanosis, or edema MS- no significant deformity or atrophy Skin- no rash or lesion Psych- euthymic mood, full affect Neuro- strength and sensation are intact  EKG-NSR, v rate at 78 bpm, pr int 168 ms, qrs int 74 ms, qtc 408 ms Epic records reviewed Linq strips reviewed  Assessment and Plan: 1.PAF by linq, asymptomatic H/o TIA symptoms and chadsvasc score  of at least 4 Continue on eliquis 5 mg bid  Continue verapamil, currently asymptomatic and low afib burden If afib burden should increase or becomes symptomatic then can discuss if medical treatment change is indicated  Will request pt appointment to get established with general cardiology afib clinic as needed  Butch Penny C. Avaiah Stempel, [facility] [address] [place], [place] [address] [phone]

## 2016-02-07 ENCOUNTER — Ambulatory Visit (INDEPENDENT_AMBULATORY_CARE_PROVIDER_SITE_OTHER): Payer: Medicare Other | Admitting: *Deleted

## 2016-02-07 DIAGNOSIS — I639 Cerebral infarction, unspecified: Secondary | ICD-10-CM

## 2016-02-07 NOTE — Progress Notes (Signed)
Carelink Summary Report / Loop Recorder 

## 2016-02-10 ENCOUNTER — Encounter: Payer: Self-pay | Admitting: Nurse Practitioner

## 2016-02-15 DIAGNOSIS — R197 Diarrhea, unspecified: Secondary | ICD-10-CM | POA: Diagnosis not present

## 2016-02-17 DIAGNOSIS — A047 Enterocolitis due to Clostridium difficile: Secondary | ICD-10-CM | POA: Diagnosis not present

## 2016-02-23 ENCOUNTER — Ambulatory Visit (HOSPITAL_COMMUNITY)
Admission: RE | Admit: 2016-02-23 | Discharge: 2016-02-23 | Disposition: A | Discharge status: Home / Self Care | Payer: Medicare Other | Source: Ambulatory Visit | Attending: Internal Medicine | Admitting: Internal Medicine

## 2016-02-23 ENCOUNTER — Other Ambulatory Visit (HOSPITAL_COMMUNITY): Payer: Self-pay | Admitting: Internal Medicine

## 2016-02-23 DIAGNOSIS — R918 Other nonspecific abnormal finding of lung field: Secondary | ICD-10-CM | POA: Diagnosis not present

## 2016-02-23 DIAGNOSIS — J449 Chronic obstructive pulmonary disease, unspecified: Secondary | ICD-10-CM | POA: Diagnosis not present

## 2016-02-23 DIAGNOSIS — J189 Pneumonia, unspecified organism: Secondary | ICD-10-CM | POA: Diagnosis not present

## 2016-02-23 DIAGNOSIS — J984 Other disorders of lung: Secondary | ICD-10-CM | POA: Diagnosis not present

## 2016-03-02 LAB — CUP PACEART REMOTE DEVICE CHECK: Date Time Interrogation Session: 20170113123604

## 2016-03-02 NOTE — Progress Notes (Signed)
Carelink summary report received. Battery status OK. Normal device function. No new symptom episodes, tachy episodes, brady, or pause episodes. 2 AF episodes- 1.8% burden, longest 7 hrs 8 min +Eliquis. Monthly summary reports and ROV/PRN

## 2016-03-07 ENCOUNTER — Ambulatory Visit (INDEPENDENT_AMBULATORY_CARE_PROVIDER_SITE_OTHER): Payer: Medicare Other | Admitting: *Deleted

## 2016-03-07 DIAGNOSIS — I639 Cerebral infarction, unspecified: Secondary | ICD-10-CM

## 2016-03-07 NOTE — Progress Notes (Signed)
Carelink Summary Report / Loop Recorder 

## 2016-03-27 DIAGNOSIS — M4302 Spondylolysis, cervical region: Secondary | ICD-10-CM | POA: Diagnosis not present

## 2016-03-27 DIAGNOSIS — M47816 Spondylosis without myelopathy or radiculopathy, lumbar region: Secondary | ICD-10-CM | POA: Diagnosis not present

## 2016-03-27 DIAGNOSIS — Z981 Arthrodesis status: Secondary | ICD-10-CM | POA: Diagnosis not present

## 2016-03-29 DIAGNOSIS — M47816 Spondylosis without myelopathy or radiculopathy, lumbar region: Secondary | ICD-10-CM | POA: Diagnosis not present

## 2016-03-29 DIAGNOSIS — M129 Arthropathy, unspecified: Secondary | ICD-10-CM | POA: Diagnosis not present

## 2016-03-30 ENCOUNTER — Ambulatory Visit (HOSPITAL_COMMUNITY)
Admission: RE | Admit: 2016-03-30 | Discharge: 2016-03-30 | Disposition: A | Discharge status: Home / Self Care | Payer: Medicare Other | Source: Ambulatory Visit | Attending: Nurse Practitioner | Admitting: Nurse Practitioner

## 2016-03-30 ENCOUNTER — Other Ambulatory Visit (HOSPITAL_COMMUNITY): Payer: Self-pay | Admitting: Nurse Practitioner

## 2016-03-30 DIAGNOSIS — M160 Bilateral primary osteoarthritis of hip: Secondary | ICD-10-CM | POA: Diagnosis not present

## 2016-03-30 DIAGNOSIS — M8588 Other specified disorders of bone density and structure, other site: Secondary | ICD-10-CM | POA: Insufficient documentation

## 2016-03-30 DIAGNOSIS — M25552 Pain in left hip: Secondary | ICD-10-CM | POA: Diagnosis not present

## 2016-03-30 DIAGNOSIS — M1610 Unilateral primary osteoarthritis, unspecified hip: Secondary | ICD-10-CM

## 2016-04-06 ENCOUNTER — Ambulatory Visit (INDEPENDENT_AMBULATORY_CARE_PROVIDER_SITE_OTHER): Payer: Medicare Other | Admitting: *Deleted

## 2016-04-06 DIAGNOSIS — I639 Cerebral infarction, unspecified: Secondary | ICD-10-CM

## 2016-04-10 NOTE — Progress Notes (Signed)
Carelink Summary Report / Loop Recorder 

## 2016-04-17 LAB — CUP PACEART REMOTE DEVICE CHECK: Date Time Interrogation Session: 20170212123608

## 2016-04-17 NOTE — Progress Notes (Signed)
Carelink summary report received. Battery status OK. Normal device function. No new symptom episodes, tachy episodes, brady, or pause episodes. 1 AF 0% +Eliquis. Good histogram. Monthly summary reports and ROV/PRN

## 2016-04-20 DIAGNOSIS — I712 Thoracic aortic aneurysm, without rupture: Secondary | ICD-10-CM | POA: Diagnosis not present

## 2016-04-20 DIAGNOSIS — I480 Paroxysmal atrial fibrillation: Secondary | ICD-10-CM | POA: Diagnosis not present

## 2016-04-20 DIAGNOSIS — R319 Hematuria, unspecified: Secondary | ICD-10-CM | POA: Diagnosis not present

## 2016-04-21 DIAGNOSIS — N5201 Erectile dysfunction due to arterial insufficiency: Secondary | ICD-10-CM | POA: Diagnosis not present

## 2016-04-21 DIAGNOSIS — R3121 Asymptomatic microscopic hematuria: Secondary | ICD-10-CM | POA: Diagnosis not present

## 2016-04-21 DIAGNOSIS — Z0000 Encounter for general adult medical examination without abnormal findings: Secondary | ICD-10-CM | POA: Diagnosis not present

## 2016-04-25 ENCOUNTER — Other Ambulatory Visit: Payer: Self-pay | Admitting: Urology

## 2016-04-25 DIAGNOSIS — C670 Malignant neoplasm of trigone of bladder: Secondary | ICD-10-CM

## 2016-05-02 ENCOUNTER — Other Ambulatory Visit: Payer: Self-pay | Admitting: Urology

## 2016-05-02 ENCOUNTER — Ambulatory Visit (HOSPITAL_COMMUNITY)
Admission: RE | Admit: 2016-05-02 | Discharge: 2016-05-02 | Disposition: A | Discharge status: Home / Self Care | Payer: Medicare Other | Source: Ambulatory Visit | Attending: Urology | Admitting: Urology

## 2016-05-02 DIAGNOSIS — N281 Cyst of kidney, acquired: Secondary | ICD-10-CM | POA: Insufficient documentation

## 2016-05-02 DIAGNOSIS — N200 Calculus of kidney: Secondary | ICD-10-CM | POA: Insufficient documentation

## 2016-05-02 DIAGNOSIS — C670 Malignant neoplasm of trigone of bladder: Secondary | ICD-10-CM | POA: Diagnosis not present

## 2016-05-02 LAB — POCT I-STAT CREATININE: Creatinine, Ser: 1.5 mg/dL — ABNORMAL HIGH (ref 0.61–1.24)

## 2016-05-02 MED ORDER — IOPAMIDOL (ISOVUE-300) INJECTION 61%
125.0000 mL | Freq: Once | INTRAVENOUS | Status: AC | PRN
  Administered 2016-05-02: 100 mL via INTRAVENOUS

## 2016-05-02 MED ORDER — SODIUM CHLORIDE 0.9 % IV SOLN
INTRAVENOUS | Status: AC
  Filled 2016-05-02: qty 250

## 2016-05-05 ENCOUNTER — Encounter (HOSPITAL_BASED_OUTPATIENT_CLINIC_OR_DEPARTMENT_OTHER): Payer: Self-pay | Admitting: *Deleted

## 2016-05-08 ENCOUNTER — Ambulatory Visit (INDEPENDENT_AMBULATORY_CARE_PROVIDER_SITE_OTHER): Payer: Medicare Other | Admitting: *Deleted

## 2016-05-08 ENCOUNTER — Ambulatory Visit: Payer: Medicare Other | Admitting: Diagnostic Neuroimaging

## 2016-05-08 ENCOUNTER — Encounter (HOSPITAL_BASED_OUTPATIENT_CLINIC_OR_DEPARTMENT_OTHER): Payer: Self-pay | Admitting: *Deleted

## 2016-05-08 DIAGNOSIS — I639 Cerebral infarction, unspecified: Secondary | ICD-10-CM

## 2016-05-08 NOTE — Progress Notes (Signed)
Carelink Summary Report / Loop Recorder 

## 2016-05-08 NOTE — Progress Notes (Signed)
NOTED PT ON ELIQUIS AND ASA.  CALLED AND LM FOR SELITA, OR SCHEULDER FOR DR DAHLSTEDT, AND ASKED IF DR DAHLSTEDT WANTED PT TO STOP ELIQUIS AND IF SO IF CLEARANCE WAS RECEIVED FROM CARDIOLOGIST.  WILL AWAIT ANSWER.

## 2016-05-09 ENCOUNTER — Encounter: Payer: Self-pay | Admitting: Cardiology

## 2016-05-09 ENCOUNTER — Ambulatory Visit (INDEPENDENT_AMBULATORY_CARE_PROVIDER_SITE_OTHER): Payer: Medicare Other | Admitting: Cardiology

## 2016-05-09 VITALS — BP 128/80 | HR 94 | Ht 73.0 in | Wt 201.4 lb

## 2016-05-09 DIAGNOSIS — I638 Other cerebral infarction: Secondary | ICD-10-CM

## 2016-05-09 DIAGNOSIS — M4302 Spondylolysis, cervical region: Secondary | ICD-10-CM | POA: Diagnosis not present

## 2016-05-09 DIAGNOSIS — I712 Thoracic aortic aneurysm, without rupture: Secondary | ICD-10-CM | POA: Diagnosis not present

## 2016-05-09 DIAGNOSIS — M47816 Spondylosis without myelopathy or radiculopathy, lumbar region: Secondary | ICD-10-CM | POA: Diagnosis not present

## 2016-05-09 DIAGNOSIS — I7121 Aneurysm of the ascending aorta, without rupture: Secondary | ICD-10-CM

## 2016-05-09 DIAGNOSIS — I639 Cerebral infarction, unspecified: Secondary | ICD-10-CM

## 2016-05-09 DIAGNOSIS — M129 Arthropathy, unspecified: Secondary | ICD-10-CM | POA: Diagnosis not present

## 2016-05-09 DIAGNOSIS — I480 Paroxysmal atrial fibrillation: Secondary | ICD-10-CM | POA: Diagnosis not present

## 2016-05-09 DIAGNOSIS — I6389 Other cerebral infarction: Secondary | ICD-10-CM

## 2016-05-09 MED ORDER — ATORVASTATIN CALCIUM 20 MG PO TABS: 20.0000 mg | ORAL_TABLET | Freq: Every day | ORAL | Status: DC

## 2016-05-09 NOTE — Progress Notes (Signed)
Cardiology Office Note    Date:  05/09/2016   ID:  Kyle Owen, DOB 22-Jul-1941, MRN [id number]  PCP:  Asencion Noble, MD  Cardiologist:   Candee Furbish, MD , EP-Dr. Rayann Heman    History of Present Illness:  Kyle Owen is a 75 y.o. male previously seen by Dr. 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 Darcey Nora here to establish care. Previously had only seen a letter physiology clinic. He was previously on verapamil for ocular migraines. Low atrial fibrillation burden of 2.5%.  Bladder cancer - having surgeryNext week. Eliquis may be held for 2 days. COPD - baseline SOB. Uphill walking. No chest pain. He is able to complete greater than 4 METS of activity without difficulty. Was shoveling sod the other day.   81min AFIB episode and 15 min episode recorded on monitor. No recent syncope (series did have one episode when raising his arms above his head in radiology last year, orthostatic). Memory issues.   Recently married New Years Eve 2016. Wife here.   Because of prior TIA symptoms, chadsvasc score of at least 4. Will ultimately see atrial for relation clinic as needed.  Has chronic back pain, leg pain. Cramping at night.    Past Medical History  Diagnosis Date  . Hearing impairment   . Migraines     takes verapamil   . Depression   . GERD (gastroesophageal reflux disease)   . Benign prostatic hypertrophy with urinary frequency   . History of colon polyps   . History of pneumonia     last bout Dec 2016 CAP  . History of TIA (transient ischemic attack) no residual    per neurologist note (dr Leta Baptist 11-08-2015) dx cryptogenic TIA versus arrhythia versus dysautonomia (right ophthalmic artery TIA and x3 posterior circulation TIAs  . Bladder cancer ([place])   . PAF (paroxysmal atrial fibrillation) ([place])     followed by AFIB clinic  . Renal cyst, left   .  Nephrolithiasis     right non-obstructive  per CT 05-02-2016  . Thoracic aortic aneurysm without rupture ([place])     ascending -- ct chest 01/10/16  4.8cm  . Arthritis     DJD right knee  . DDD (degenerative disc disease), cervical   . DDD (degenerative disc disease), lumbar   . Status post placement of implantable loop recorder     08-10-2015  . Dyspnea   . Multinodular thyroid     per pathology report 11-12-2014 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   . Anticoagulant long-term use     eliquis  . Pulmonary nodule, left     left lower lobe x2 per CT 01-20-2016  . COPD with emphysema [facility])     Past Surgical History  Procedure Laterality Date  . Elbow arthroscopy Left   . Colonoscopy  10/03/2011    Procedure: COLONOSCOPY;  Surgeon: Jamesetta So;  [facility];  Service: Gastroenterology;  Laterality: N/A;  . Cataract extraction w/phaco  12/16/2012    Procedure: CATARACT EXTRACTION PHACO AND INTRAOCULAR LENS PLACEMENT (IOC);  Surgeon: Tonny Branch, MD;  Location: [facility] ORS;  Service: Ophthalmology;  Laterality: Left;  CDE:17.31  . Knee arthroscopy Right 2015  . Posterior lumbar fusion  2014  . Thyroidectomy Right 01/20/2015    Procedure: RIGHT THYROIDECTOMY;  Surgeon: Ascencion Dike, MD;  Location: [place];  Service: ENT;  Laterality:  Right;  . Tee without cardioversion N/A 07/27/2015    Procedure: TRANSESOPHAGEAL ECHOCARDIOGRAM (TEE);  Surgeon: Lelon Perla, MD;  Location: [facility] ENDOSCOPY;  Service: Cardiovascular;  Laterality: N/A;   normal LV function, ef 55-60%,  mild AR and MR, mild dilated ascending aorta (4.2cm),  mild to moderate atherosclerosis  descending aorta,  mild to moderate TR,  negative saline microcavitation study  . Ep implantable device N/A 08/10/2015    Procedure: Loop Recorder Insertion;  Surgeon: Thompson Grayer, MD;  Location: [place] CV LAB;  Service: Cardiovascular;  Laterality: N/A;  . Shoulder arthroscopy Left 2000  .  Inguinal hernia repair Right   . Carpal tunnel release Left 07-14-2009  . Cataract extraction w/ intraocular lens implant Right     Current Medications: Outpatient Prescriptions Prior to Visit  Medication Sig Dispense Refill  . acetaminophen (TYLENOL) 500 MG tablet Take 1,000 mg by mouth daily.     Marland Kitchen apixaban (ELIQUIS) 5 MG TABS tablet Take 1 tablet (5 mg total) by mouth 2 (two) times daily. 60 tablet 11  . budesonide-formoterol (SYMBICORT) 160-4.5 MCG/ACT inhaler Inhale 2 puffs into the lungs 2 (two) times daily.    . finasteride (PROSCAR) 5 MG tablet Take 5 mg by mouth daily.    . Multiple Vitamins-Minerals (CENTRUM SILVER PO) Take by mouth.    . sertraline (ZOLOFT) 50 MG tablet Take 50 mg by mouth daily.      . simvastatin (ZOCOR) 20 MG tablet TAKE 1 TABLET BY MOUTH DAILY AT 6 EVENING. 30 tablet 1  . VENTOLIN HFA 108 (90 BASE) MCG/ACT inhaler Inhale 2 puffs into the lungs Every 4 hours as needed for wheezing or shortness of breath. Reported on 12/23/2015    . verapamil (CALAN-SR) 240 MG CR tablet Take 240 mg by mouth daily.       No facility-administered medications prior to visit.     Allergies:   Flomax   Social History   Social History  . Marital Status: Widowed    Spouse Name: N/A  . Number of Children: 3  . Years of Education: 14   Occupational History  . [employer] and [employer]     retired   Social History Main Topics  . Smoking status: Former Smoker -- 1.00 packs/day for 45 years    Types: Cigarettes    Quit date: 09/26/2010  . Smokeless tobacco: Never Used  . Alcohol Use: No  . Drug Use: No  . Sexual Activity: No   Other Topics Concern  . None   Social History Narrative   Widowed, lives with dog, Jake in [place], wife passed from Parkinson's disease 3 1/2 yrs ago   1 cup coffee daily     Family History:  The patient's family history includes Breast cancer in his sister; Stroke in his brother and sister.   ROS:   Please see the history of present illness.      ROS All other systems reviewed and are negative.   PHYSICAL EXAM:   VS:  BP 128/80 mmHg  Pulse 94  Ht 6\' 1"  (1.854 m)  Wt 201 lb 6.4 oz (91.354 kg)  BMI 26.58 kg/m2   GEN: Well nourished, well developed, in no acute distress HEENT: normal Neck: no JVD, carotid bruits, or masses Cardiac: RRR; no murmurs, rubs, or gallops,no edema  Respiratory:  clear to auscultation bilaterally, normal work of breathing GI: soft, nontender, nondistended, + BS MS: no deformity or atrophy Skin: warm and dry, no rash Neuro:  Alert  and Oriented x 3, Strength and sensation are intact Psych: euthymic mood, full affect  Wt Readings from Last 3 Encounters:  05/09/16 201 lb 6.4 oz (91.354 kg)  01/31/16 208 lb 9.6 oz (94.62 kg)  01/18/16 206 lb (93.441 kg)      Studies/Labs Reviewed:   EKG:  EKG is not ordered today.  01/31/16-sinus rhythm, 78, septal infarct pattern personally viewed  Recent Labs: 06/09/2015: BUN 22 05/02/2016: Creatinine, Ser 1.50*   Lipid Panel    Component Value Date/Time   CHOL 225* 06/09/2015 1040   TRIG 100 06/09/2015 1040   HDL 68 06/09/2015 1040   CHOLHDL 3.3 06/09/2015 1040   LDLCALC 137* 06/09/2015 1040    Additional studies/ records that were reviewed today include:  CT of chest January 2017-4.8 cm ascending aortic aneurysm  Echocardiogram 06/10/15: - Left ventricle: The cavity size was normal. Wall thickness was  normal. Systolic function was normal. The estimated ejection  fraction was in the range of 60% to 65%. Wall motion was normal;  there were no regional wall motion abnormalities. Doppler  parameters are consistent with abnormal left ventricular  relaxation (grade 1 diastolic dysfunction). The E/e&' ratio is <8,  suggesting normal LV filling pressure. - Aortic valve: Trileaflet. Sclerosis without stenosis. There was  trivial regurgitation. - Left atrium: The atrium was normal in size. - Right atrium: Moderately dilated at 25 cm2. - Tricuspid  valve: There was mild regurgitation. - Pulmonary arteries: PA peak pressure: 29 mm Hg (S). - Inferior vena cava: The vessel was normal in size. The  respirophasic diameter changes were in the normal range (= 50%),  consistent with normal central venous pressure.  Impressions:  - Compared to a prior echo in 2015, there are few changes. RVSP is  lower at 29 mmHg.  Prior lab work, office notes, EKG, echo and reviewed    ASSESSMENT:    No diagnosis found.   PLAN:  In order of problems listed above:  Paroxysmal atrial fibrillation  - On Eliquis, CHADS VASC - 4 (prior TIA)  - Low burden noted on Linq (2.5%)  - On verapamil for rate control as well as migraine prophylaxis.  - Prior cryptogenic stroke  History of cryptogenic stroke  - On anticoagulation now that atrial fibrillation has been discovered  Ascending aortic aneurysm, thoracic  - Followed by Dr. Darcey Nora (he is seeing him every 6 months)  - 4.8 cm 01/10/16. CT scan. Personally viewed.  Hyperlipidemia  - Prior LDL 137, on simvastatin 20 mg. Changing to atorvastatin 20. Watch for any adverse symptoms. He does have baseline cramping.  Preoperative risk stratification for upcoming bladder surgery/bladder cancer  - EF normal. No chest pain. Able to complete greater than 4 METS of activity without difficulty. I'm comfortable allowing him to proceed with surgery with low overall cardiac risk. He may hold his Eliquis for 2 days prior to bladder surgery. Resume hopefully day after surgery if okay with urology. Small risk of stroke during cessation period.   COPD  - At baseline. Former smoker, quit in 2011.  Medication Adjustments/Labs and Tests Ordered: Current medicines are reviewed at length with the patient today.  Concerns regarding medicines are outlined above.  Medication changes, Labs and Tests ordered today are listed in the Patient Instructions below. There are no Patient Instructions on file for this visit.    Signed, Candee Furbish, MD  05/09/2016 10:49 AM    [facility] [address], [place], [place]  [address]  Phone: [phone]; Fax: [phone]

## 2016-05-09 NOTE — Patient Instructions (Signed)
Medication Instructions:  Please stop your simvastatin and start Atorvastatin 20 mg a day. Continue all other medications as listed.  Follow-Up: Follow up in 6 months with Dr. Marlou Porch.  You will receive a letter in the mail 2 months before you are due.  Please call us when you receive this letter to schedule your follow up appointment.  If you need a refill on your cardiac medications before your next appointment, please call your pharmacy.  Thank you for choosing [facility]!!

## 2016-05-10 ENCOUNTER — Encounter (HOSPITAL_BASED_OUTPATIENT_CLINIC_OR_DEPARTMENT_OTHER): Payer: Self-pay | Admitting: *Deleted

## 2016-05-10 NOTE — Progress Notes (Signed)
NPO AFTER MN.  ARRIVE AT 0715.  NEEDS ISTAT.  CURRENT EKG AND CHEST CT IN CHART AND EPIC.  WILL TAKE AM MEDS DOS W/ SIPS OF WATER. VERBALIZED UNDERSTANDING LAST TO TAKE ELIQUIS IS SAT. 05-13-2016.  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

## 2016-05-15 ENCOUNTER — Encounter (HOSPITAL_BASED_OUTPATIENT_CLINIC_OR_DEPARTMENT_OTHER)
Admission: RE | Disposition: A | Discharge status: Home / Self Care | Payer: Self-pay | Source: Ambulatory Visit | Attending: Urology

## 2016-05-15 ENCOUNTER — Ambulatory Visit (HOSPITAL_BASED_OUTPATIENT_CLINIC_OR_DEPARTMENT_OTHER): Payer: Medicare Other | Admitting: Anesthesiology

## 2016-05-15 ENCOUNTER — Encounter (HOSPITAL_BASED_OUTPATIENT_CLINIC_OR_DEPARTMENT_OTHER): Payer: Self-pay | Admitting: *Deleted

## 2016-05-15 ENCOUNTER — Ambulatory Visit (HOSPITAL_BASED_OUTPATIENT_CLINIC_OR_DEPARTMENT_OTHER)
Admission: RE | Admit: 2016-05-15 | Discharge: 2016-05-15 | Disposition: A | Discharge status: Home / Self Care | Payer: Medicare Other | Source: Ambulatory Visit | Attending: Urology | Admitting: Urology

## 2016-05-15 DIAGNOSIS — G43909 Migraine, unspecified, not intractable, without status migrainosus: Secondary | ICD-10-CM | POA: Diagnosis not present

## 2016-05-15 DIAGNOSIS — N281 Cyst of kidney, acquired: Secondary | ICD-10-CM | POA: Insufficient documentation

## 2016-05-15 DIAGNOSIS — I739 Peripheral vascular disease, unspecified: Secondary | ICD-10-CM | POA: Insufficient documentation

## 2016-05-15 DIAGNOSIS — K219 Gastro-esophageal reflux disease without esophagitis: Secondary | ICD-10-CM | POA: Diagnosis not present

## 2016-05-15 DIAGNOSIS — C679 Malignant neoplasm of bladder, unspecified: Secondary | ICD-10-CM | POA: Diagnosis not present

## 2016-05-15 DIAGNOSIS — Z8673 Personal history of transient ischemic attack (TIA), and cerebral infarction without residual deficits: Secondary | ICD-10-CM | POA: Insufficient documentation

## 2016-05-15 DIAGNOSIS — Z87891 Personal history of nicotine dependence: Secondary | ICD-10-CM | POA: Diagnosis not present

## 2016-05-15 DIAGNOSIS — Z7901 Long term (current) use of anticoagulants: Secondary | ICD-10-CM | POA: Diagnosis not present

## 2016-05-15 DIAGNOSIS — N200 Calculus of kidney: Secondary | ICD-10-CM | POA: Diagnosis not present

## 2016-05-15 DIAGNOSIS — Z95818 Presence of other cardiac implants and grafts: Secondary | ICD-10-CM | POA: Insufficient documentation

## 2016-05-15 DIAGNOSIS — R351 Nocturia: Secondary | ICD-10-CM | POA: Diagnosis not present

## 2016-05-15 DIAGNOSIS — D494 Neoplasm of unspecified behavior of bladder: Secondary | ICD-10-CM | POA: Diagnosis present

## 2016-05-15 DIAGNOSIS — J439 Emphysema, unspecified: Secondary | ICD-10-CM | POA: Diagnosis not present

## 2016-05-15 DIAGNOSIS — J449 Chronic obstructive pulmonary disease, unspecified: Secondary | ICD-10-CM | POA: Diagnosis not present

## 2016-05-15 DIAGNOSIS — M179 Osteoarthritis of knee, unspecified: Secondary | ICD-10-CM | POA: Diagnosis not present

## 2016-05-15 DIAGNOSIS — I480 Paroxysmal atrial fibrillation: Secondary | ICD-10-CM | POA: Diagnosis not present

## 2016-05-15 DIAGNOSIS — C670 Malignant neoplasm of trigone of bladder: Secondary | ICD-10-CM | POA: Insufficient documentation

## 2016-05-15 DIAGNOSIS — I712 Thoracic aortic aneurysm, without rupture: Secondary | ICD-10-CM | POA: Diagnosis not present

## 2016-05-15 HISTORY — DX: Personal history of colonic polyps: Z86.010

## 2016-05-15 HISTORY — DX: Paroxysmal atrial fibrillation: I48.0

## 2016-05-15 HISTORY — DX: Personal history of transient ischemic attack (TIA), and cerebral infarction without residual deficits: Z86.73

## 2016-05-15 HISTORY — DX: Personal history of colon polyps, unspecified: Z86.0100

## 2016-05-15 HISTORY — DX: Cyst of kidney, acquired: N28.1

## 2016-05-15 HISTORY — DX: Emphysema, unspecified: J43.9

## 2016-05-15 HISTORY — DX: Nontoxic multinodular goiter: E04.2

## 2016-05-15 HISTORY — DX: Solitary pulmonary nodule: R91.1

## 2016-05-15 HISTORY — DX: Dyspnea, unspecified: R06.00

## 2016-05-15 HISTORY — DX: Personal history of pneumonia (recurrent): Z87.01

## 2016-05-15 HISTORY — DX: Long term (current) use of anticoagulants: Z79.01

## 2016-05-15 HISTORY — PX: TRANSURETHRAL RESECTION OF BLADDER TUMOR: SHX2575

## 2016-05-15 HISTORY — DX: Presence of external hearing-aid: Z97.4

## 2016-05-15 HISTORY — DX: Hyperlipidemia, unspecified: E78.5

## 2016-05-15 HISTORY — DX: Malignant neoplasm of bladder, unspecified: C67.9

## 2016-05-15 HISTORY — DX: Migraine with aura, not intractable, without status migrainosus: G43.109

## 2016-05-15 HISTORY — DX: Calculus of kidney: N20.0

## 2016-05-15 LAB — POCT I-STAT 4, (NA,K, GLUC, HGB,HCT)
Glucose, Bld: 96 mg/dL (ref 65–99)
HCT: 42 % (ref 39.0–52.0)
Hemoglobin: 14.3 g/dL (ref 13.0–17.0)
Potassium: 4.3 mmol/L (ref 3.5–5.1)
Sodium: 142 mmol/L (ref 135–145)

## 2016-05-15 SURGERY — TURBT (TRANSURETHRAL RESECTION OF BLADDER TUMOR)
Anesthesia: General | Site: Bladder

## 2016-05-15 MED ORDER — ONDANSETRON HCL 4 MG/2ML IJ SOLN
INTRAMUSCULAR | Status: DC | PRN
  Administered 2016-05-15: 4 mg via INTRAVENOUS

## 2016-05-15 MED ORDER — LACTATED RINGERS IV SOLN
INTRAVENOUS | Status: DC
  Administered 2016-05-15: 09:00:00 via INTRAVENOUS
  Filled 2016-05-15: qty 1000

## 2016-05-15 MED ORDER — STERILE WATER FOR IRRIGATION IR SOLN
Status: DC | PRN
  Administered 2016-05-15: 500 mL

## 2016-05-15 MED ORDER — FENTANYL CITRATE (PF) 100 MCG/2ML IJ SOLN
INTRAMUSCULAR | Status: AC
  Filled 2016-05-15: qty 2

## 2016-05-15 MED ORDER — CEPHALEXIN 500 MG PO CAPS: 500.0000 mg | ORAL_CAPSULE | Freq: Two times a day (BID) | ORAL | Status: DC

## 2016-05-15 MED ORDER — WHITE PETROLATUM GEL
Status: AC
  Filled 2016-05-15: qty 5

## 2016-05-15 MED ORDER — DEXAMETHASONE SODIUM PHOSPHATE 10 MG/ML IJ SOLN
INTRAMUSCULAR | Status: DC | PRN
  Administered 2016-05-15: 10 mg via INTRAVENOUS

## 2016-05-15 MED ORDER — SODIUM CHLORIDE 0.9 % IR SOLN
Status: DC | PRN
  Administered 2016-05-15 (×2): 3000 mL

## 2016-05-15 MED ORDER — PROPOFOL 10 MG/ML IV BOLUS
INTRAVENOUS | Status: DC | PRN
  Administered 2016-05-15: 200 mg via INTRAVENOUS

## 2016-05-15 MED ORDER — FENTANYL CITRATE (PF) 100 MCG/2ML IJ SOLN
INTRAMUSCULAR | Status: DC | PRN
  Administered 2016-05-15 (×2): 25 ug via INTRAVENOUS
  Administered 2016-05-15: 50 ug via INTRAVENOUS

## 2016-05-15 MED ORDER — ONDANSETRON HCL 4 MG/2ML IJ SOLN
INTRAMUSCULAR | Status: AC
  Filled 2016-05-15: qty 2

## 2016-05-15 MED ORDER — HYDROMORPHONE HCL 1 MG/ML IJ SOLN
0.2500 mg | INTRAMUSCULAR | Status: DC | PRN
  Administered 2016-05-15: 0.5 mg via INTRAVENOUS
  Filled 2016-05-15: qty 1

## 2016-05-15 MED ORDER — HYDROMORPHONE HCL 1 MG/ML IJ SOLN
INTRAMUSCULAR | Status: AC
  Filled 2016-05-15: qty 1

## 2016-05-15 MED ORDER — MEPERIDINE HCL 25 MG/ML IJ SOLN
6.2500 mg | INTRAMUSCULAR | Status: DC | PRN
  Filled 2016-05-15: qty 1

## 2016-05-15 MED ORDER — PROPOFOL 10 MG/ML IV BOLUS
INTRAVENOUS | Status: AC
  Filled 2016-05-15: qty 20

## 2016-05-15 MED ORDER — OXYCODONE HCL 5 MG PO TABS
5.0000 mg | ORAL_TABLET | Freq: Once | ORAL | Status: DC | PRN
  Filled 2016-05-15: qty 1

## 2016-05-15 MED ORDER — CEFAZOLIN SODIUM 1-5 GM-% IV SOLN
1.0000 g | INTRAVENOUS | Status: AC
  Administered 2016-05-15: 2 g via INTRAVENOUS
  Filled 2016-05-15: qty 50

## 2016-05-15 MED ORDER — LIDOCAINE HCL (CARDIAC) 20 MG/ML IV SOLN
INTRAVENOUS | Status: DC | PRN
  Administered 2016-05-15: 80 mg via INTRAVENOUS

## 2016-05-15 MED ORDER — URIBEL 118 MG PO CAPS: 1.0000 | ORAL_CAPSULE | Freq: Three times a day (TID) | ORAL | Status: DC | PRN

## 2016-05-15 MED ORDER — ARTIFICIAL TEARS OP OINT
TOPICAL_OINTMENT | OPHTHALMIC | Status: AC
  Filled 2016-05-15: qty 3.5

## 2016-05-15 MED ORDER — LIDOCAINE HCL (CARDIAC) 20 MG/ML IV SOLN
INTRAVENOUS | Status: AC
  Filled 2016-05-15: qty 5

## 2016-05-15 MED ORDER — SODIUM CHLORIDE 0.9 % IV SOLN
50.0000 mg | Freq: Once | INTRAVENOUS | Status: AC
  Administered 2016-05-15: 50 mg via INTRAVESICAL
  Filled 2016-05-15 (×2): qty 25

## 2016-05-15 MED ORDER — CEFAZOLIN SODIUM-DEXTROSE 2-4 GM/100ML-% IV SOLN
INTRAVENOUS | Status: AC
  Filled 2016-05-15: qty 100

## 2016-05-15 MED ORDER — OXYCODONE HCL 5 MG/5ML PO SOLN
5.0000 mg | Freq: Once | ORAL | Status: DC | PRN
  Filled 2016-05-15: qty 5

## 2016-05-15 SURGICAL SUPPLY — 31 items
BAG DRAIN URO-CYSTO SKYTR STRL (DRAIN) ×2 IMPLANT
BAG DRN ANRFLXCHMBR STRAP LEK (BAG)
BAG DRN UROCATH (DRAIN) ×1
BAG URINE DRAINAGE (UROLOGICAL SUPPLIES) ×1 IMPLANT
BAG URINE LEG 19OZ MD ST LTX (BAG) IMPLANT
CATH FOLEY 2WAY SLVR  5CC 20FR (CATHETERS)
CATH FOLEY 2WAY SLVR  5CC 22FR (CATHETERS)
CATH FOLEY 2WAY SLVR 5CC 20FR (CATHETERS) IMPLANT
CATH FOLEY 2WAY SLVR 5CC 22FR (CATHETERS) IMPLANT
CATH FOLEY 3WAY 30CC 22F (CATHETERS) IMPLANT
CLOTH BEACON ORANGE TIMEOUT ST (SAFETY) ×2 IMPLANT
ELECT BUTTON BIOP 24F 90D PLAS (MISCELLANEOUS) IMPLANT
ELECT REM PT RETURN 9FT ADLT (ELECTROSURGICAL) ×2
ELECTRODE REM PT RTRN 9FT ADLT (ELECTROSURGICAL) ×1 IMPLANT
EVACUATOR MICROVAS BLADDER (UROLOGICAL SUPPLIES) IMPLANT
GLOVE BIO SURGEON STRL SZ8 (GLOVE) ×2 IMPLANT
GOWN STRL REUS W/ TWL LRG LVL3 (GOWN DISPOSABLE) IMPLANT
GOWN STRL REUS W/ TWL XL LVL3 (GOWN DISPOSABLE) ×1 IMPLANT
GOWN STRL REUS W/TWL LRG LVL3 (GOWN DISPOSABLE) ×2
GOWN STRL REUS W/TWL XL LVL3 (GOWN DISPOSABLE) ×2
GOWN XL W/COTTON TOWEL STD (GOWNS) ×2 IMPLANT
HOLDER FOLEY CATH W/STRAP (MISCELLANEOUS) IMPLANT
IV NS IRRIG 3000ML ARTHROMATIC (IV SOLUTION) ×2 IMPLANT
KIT ROOM TURNOVER WOR (KITS) ×2 IMPLANT
LOOP CUT BIPOLAR 24F LRG (ELECTROSURGICAL) ×1 IMPLANT
MANIFOLD NEPTUNE II (INSTRUMENTS) ×1 IMPLANT
PACK CYSTO (CUSTOM PROCEDURE TRAY) ×2 IMPLANT
PLUG CATH AND CAP STER (CATHETERS) IMPLANT
SET ASPIRATION TUBING (TUBING) IMPLANT
SYRINGE IRR TOOMEY STRL 70CC (SYRINGE) IMPLANT
TUBE CONNECTING 12X1/4 (SUCTIONS) ×1 IMPLANT

## 2016-05-15 NOTE — Anesthesia Postprocedure Evaluation (Signed)
Anesthesia Post Note  Patient: Kyle Owen  Procedure(s) Performed: Procedure(s) (LRB): TRANSURETHRAL RESECTION OF BLADDER TUMOR (TURBT) AND INSTILLATION OF EPIRUBICIN (N/A)  Patient location during evaluation: PACU Anesthesia Type: General Level of consciousness: awake and alert Pain management: pain level controlled Vital Signs Assessment: post-procedure vital signs reviewed and stable Respiratory status: spontaneous breathing, nonlabored ventilation, respiratory function stable and patient connected to face mask oxygen Cardiovascular status: blood pressure returned to baseline and stable Postop Assessment: no signs of nausea or vomiting Anesthetic complications: no    Last Vitals:  Filed Vitals:   05/15/16 0742 05/15/16 1039  BP: 130/71 143/101  Pulse: 62   Temp: 36.7 C 36.6 C  Resp: 16 12    Last Pain: There were no vitals filed for this visit.               [name]

## 2016-05-15 NOTE — Transfer of Care (Signed)
Immediate Anesthesia Transfer of Care Note  Patient: Kyle Owen  Procedure(s) Performed: Procedure(s) (LRB): TRANSURETHRAL RESECTION OF BLADDER TUMOR (TURBT) AND INSTILLATION OF EPIRUBICIN (N/A)  Patient Location: PACU  Anesthesia Type: General  Level of Consciousness: awake, sedated, patient cooperative and responds to stimulation  Airway & Oxygen Therapy: Patient Spontanous Breathing and Patient connected to face mask oxygen, ointment applied to lips   Post-op Assessment: Report given to PACU RN, Post -op Vital signs reviewed and stable and Patient moving all extremities  Post vital signs: Reviewed and stable  Complications: No apparent anesthesia complications

## 2016-05-15 NOTE — Anesthesia Procedure Notes (Signed)
Procedure Name: LMA Insertion Date/Time: 05/15/2016 10:05 AM Performed by: Justice Rocher Pre-anesthesia Checklist: Patient identified, Emergency Drugs available, Suction available and Patient being monitored Patient Re-evaluated:Patient Re-evaluated prior to inductionOxygen Delivery Method: Circle System Utilized Preoxygenation: Pre-oxygenation with 100% oxygen Intubation Type: IV induction Ventilation: Mask ventilation without difficulty LMA: LMA inserted LMA Size: 4.0 Number of attempts: 1 Airway Equipment and Method: Bite block Placement Confirmation: positive ETCO2 Tube secured with: Tape Dental Injury: Teeth and Oropharynx as per pre-operative assessment

## 2016-05-15 NOTE — H&P (Signed)
Urology History and Physical Exam  CC: Bladder tumor  HPI: 75 year old male presents for TURBT. He was recently referred by Dr Willey Blade for evaluation of hematuria. 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 Hematuria CT revealed left renal cysts as well as a right lower pole stone.   He now presents for TUR-BT of his bladder tumor to be followed by administration of epirubicin.  PMH: Past Medical History  Diagnosis Date  . Migraines     takes verapamil   . Depression   . Benign prostatic hypertrophy with urinary frequency   . History of colon polyps   . History of pneumonia     hx Recurrent -- last bout Dec 2016 CAP-- per pt resolved  . History of TIA (transient ischemic attack) no residual per pt    per neurologist note (dr Leta Baptist 11-08-2015) dx cryptogenic TIA versus arrhythia versus dysautonomia (right ophthalmic artery TIA and x3 posterior circulation TIAs  . Bladder cancer ([place])   . PAF (paroxysmal atrial fibrillation) ([place])     followed by AFIB clinic-- cardiologist-- dr Marlou Porch  . Renal cyst, left   . Nephrolithiasis     right non-obstructive  per CT 05-02-2016  . Thoracic aortic aneurysm without rupture ([place])     ascending -- ct chest 01/10/16  4.8cm  . Arthritis     DJD right knee  . DDD (degenerative disc disease), cervical   . DDD (degenerative disc disease), lumbar   . Status post placement of implantable loop recorder     08-10-2015  . Multinodular thyroid     per pathology report 11-12-2014 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   . Anticoagulant long-term use     eliquis  . Pulmonary nodule, left     left lower lobe x2 per CT 01-20-2016  . COPD with emphysema ([place])   . Dyspnea on exertion   . GERD (gastroesophageal reflux disease)   . Nocturia   . Wears glasses   . Wears hearing aid     bilateral-- wear at times  . Hyperlipidemia   . Ocular migraine     controlled w/ verapamil    PSH: Past  Surgical History  Procedure Laterality Date  . Colonoscopy  10/03/2011    Procedure: COLONOSCOPY;  Surgeon: Jamesetta So;  [facility];  Service: Gastroenterology;  Laterality: N/A;  . Cataract extraction w/phaco  12/16/2012    Procedure: CATARACT EXTRACTION PHACO AND INTRAOCULAR LENS PLACEMENT (IOC);  Surgeon: Tonny Branch, MD;  Location: [facility];  Service: Ophthalmology;  Laterality: Left;  CDE:17.31  . Knee arthroscopy Right 2015  . Posterior lumbar fusion  2014  . Thyroidectomy Right 01/20/2015    Procedure: RIGHT THYROIDECTOMY;  Surgeon: Ascencion Dike, MD;  Location: [place];  Service: ENT;  Laterality: Right;  . Tee without cardioversion N/A 07/27/2015    Procedure: TRANSESOPHAGEAL ECHOCARDIOGRAM (TEE);  Surgeon: Lelon Perla, MD;  Location: [facility] ENDOSCOPY;  Service: Cardiovascular;  Laterality: N/A;   normal LV function, ef 55-60%,  mild AR and MR, mild dilated ascending aorta (4.2cm),  mild to moderate atherosclerosis  descending aorta,  mild to moderate TR,  negative saline microcavitation study  . Ep implantable device N/A 08/10/2015    Procedure: Loop Recorder Insertion;  Surgeon: Thompson Grayer, MD;  Location: [place] CV LAB;  Service: Cardiovascular;  Laterality: N/A;  . Shoulder arthroscopy Left 1990's  . Cataract extraction w/ intraocular lens implant Right 2016  .  Left cubital tunnel release    . Decompressioin ulnar nerve and cubital tunnel release Left 07-14-2009    elbow  . Tonsillectomy  as child  . Inguinal hernia repair Right 1990's    Allergies: Allergies  Allergen Reactions  . Flomax [Tamsulosin Hcl] Other (See Comments)    "Pt thought he was dying (dizzy, n/v)"    Medications: No prescriptions prior to admission     Social History: Social History   Social History  . Marital Status: Married    Spouse Name: N/A  . Number of Children: 3  . Years of Education: 14   Occupational History  . [employer] and [employer]     retired   Social History Main  Topics  . Smoking status: Former Smoker -- 1.00 packs/day for 45 years    Types: Cigarettes    Quit date: 09/26/2010  . Smokeless tobacco: Never Used  . Alcohol Use: No  . Drug Use: No  . Sexual Activity: Not on file   Other Topics Concern  . Not on file   Social History Narrative   Widowed, lives with dog, [place] in [place], wife passed from Parkinson's disease 3 1/2 yrs ago   1 cup coffee daily    Family History: Family History  Problem Relation Age of Onset  . Stroke Sister   . Breast cancer Sister   . Stroke Brother     Review of Systems: Genitourinary, constitutional, skin, eye, otolaryngeal, hematologic/lymphatic, cardiovascular, pulmonary, endocrine, musculoskeletal, gastrointestinal, neurological and psychiatric system(s) were reviewed and pertinent findings if present are noted and are otherwise negative.  Genitourinary: urinary frequency, nocturia, incontinence, hematuria and erectile dysfunction.  Respiratory: shortness of breath.  Musculoskeletal: back pain and joint pain.  Neurological: headache and dizziness.   Physical Exam: @VITALS2 @ General: No acute distress.  Awake. Head:  Normocephalic.  Atraumatic. ENT:  EOMI.  Mucous membranes moist Neck:  Supple.  No lymphadenopathy. CV:  S1 present. S2 present. Regular rate. Pulmonary: Equal effort bilaterally.  Clear to auscultation bilaterally. Abdomen: Soft.  Non tender to palpation. Skin:  Normal turgor.  No visible rash. Extremity: No gross deformity of bilateral upper extremities.  No gross deformity of                             lower extremities. Neurologic: Alert. Appropriate mood.  .  Studies:  No results for input(s): HGB, WBC, PLT in the last 72 hours.  No results for input(s): NA, K, CL, CO2, BUN, CREATININE, CALCIUM, GFRNONAA, GFRAA in the last 72 hours.  Invalid input(s): MAGNESIUM   No results for input(s): INR, APTT in the last 72 hours.  Invalid input(s): PT   Invalid input(s):  ABG    Assessment:  Right trigonal papillary neoplasm  Plan: TURBT

## 2016-05-15 NOTE — Op Note (Signed)
Preoperative diagnosis: Right trigonal bladder cancer  Postoperative diagnosis: Same  Principal procedure: Transurethral resection of bladder tumor, 2 cm  Surgeon: Katherina Wimer  Anesthesia: Gen.  Specimen: Bladder tumor, to pathology  Drains: 13 French Foley catheter  Estimated blood loss: 10 mL  Complications: None  Indications: 75 year old male recently diagnosed with the right trigonal bladder tumor, presenting with hematuria. CT revealed a right lower pole stone, left renal cysts and the previously mentioned bladder tumor. He presents at this time for cystoscopic/endoscopic resection plus placement of epirubicin postoperatively. Risks and complications of the procedure have been discussed with the patient who desires to proceed.  Procedure: The patient was properly identified in the holding area. He received preoperative IV antibiotics, 2 g of Ancef. He was taken the operating room where general anesthetic was administered. He was placed in the dorsolithotomy position. Genitalia and perineum were prepped and draped. Proper timeout was performed.  A 26 French resectoscope sheath was passed with the visual obturator. Prostate was nonobstructive, urethra normal. Bladder was inspected circumferentially. Trigonal tumor near the right ureteral orifice noted which was papillary in configuration. The right ureteral orifice was just medial to this tumor. Left ureteral orifice was normal in configuration and location. No other urothelial abnormalities of the bladder were noted.  The visual obturator was removed and replaced with the resectoscope and the cutting loop. The bladder tumor was resected down to the muscular layer. Bladder tumor fragments were then removed and sent for permanent pathology labeled "bladder tumor". Ureteral orifice was intact following resection. There were no further papillary lesions noted following resection down to the muscular layer. Cautery was used to obtain hemostasis.  Adequate hemostasis was identified. At this point the scope was removed and a 20 [place] Foley catheter was placed and the bladder drained.  The catheter was then plugged and the patient was taken to the recovery room, having tolerated procedure well. In the recovery room, 50 mg of epirubicin was placed within the bladder and left indwelling for 1 hour.  The patient was then discharged home.

## 2016-05-15 NOTE — Discharge Instructions (Signed)
Transurethral Resection of Bladder Tumor (TURBT)  °Definition:  °Transurethral Resection of the Bladder Tumor is a surgical procedure used to diagnose and remove tumors within the bladder. TURBT is the most common treatment for early stage bladder cancer.  ° °General instructions:  °Your recent bladder surgery requires very little post hospital care but some definite precautions.  °Despite the fact that no skin incisions were used, the area around the tumor removal site is raw and covered with scabs to promote healing and prevent bleeding. Certain precautions are needed to insure that the scabs are not disturbed over the next 2-4 weeks while the healing proceeds.  °Because the raw surface inside your bladder and the irritating effects of urine you may expect frequency of urination and/or urgency (a stronger desire to urinate) and perhaps even getting up at night more often. This will usually resolve or improve slowly over the healing period. You may see some blood in your urine over the first 6 weeks. Do not be alarmed, even if the urine was clear for a while. Get off your feet and drink lots of fluids until clearing occurs. If you start to pass clots or don't improve call us.  ° °Diet:  °You may return to your normal diet immediately. Because of the raw surface of your bladder, alcohol, spicy foods, foods high in acid and drinks with caffeine may cause irritation or frequency and should be used in moderation. To keep your urine flowing freely and avoid constipation, drink plenty of fluids during the day (8-10 glasses). Tip: Avoid cranberry juice because it is very acidic. °  °Activity:  °Your physical activity needs to be restricted somewhat.  We suggest that you reduce your activity under the circumstances until the bleeding has stopped. Heavy lifting (greater than 20 lbs.) and heavy exertion should be limited for 2-3 weeks. ° °Bowels:  °It is important to keep your bowels regular during the postoperative period.  Straining with bowel movements can cause bleeding. A bowel movement every other day is reasonable. Use a mild laxative if needed, such as milk of magnesia 2-3 tablespoons, or 2 Dulcolax tablets. Call if you continue to have problems. If you had been taking narcotics for pain, before, during or after your surgery, you may be constipated.  ° °Medication:  °You should resume your pre-surgery medications unless told not to. In addition you may be given an antibiotic to prevent or treat infection. Antibiotics are not always necessary. All medication should be taken as prescribed until the bottles are finished unless you are having an unusual reaction to one of the drugs.  °General Anesthetic, Adult  °A doctor specialized in giving anesthesia (anesthesiologist) or a nurse specialized in giving anesthesia (nurse anesthetist) gives medicine that makes you sleep while a procedure is performed (general anesthetic). Once the general anesthetic has been administered, you will be in a sleeplike state in which you feel no pain. After having a general anesthetic you may feel:  °Dizzy.  °Weak.  °Drowsy.  °Confused.  °These feelings are normal and can be expected to last for up to 24 hours after the procedure. ° ° °LET YOUR CAREGIVER KNOW ABOUT:  °Allergies you have.  °Medications you are taking, including herbs, eye drops, over the counter medications, dietary supplements, and creams.  °Previous problems you have had with anesthetics or numbing medicines.  °Use of cigarettes, alcohol, or illicit drugs.  °Possibility of pregnancy, if this applies.  °History of bleeding or blood disorders, including blood clots and clotting   disorders.  Previous surgeries you have had and types of anesthetics you have received.  Family medical history, especially anesthetic problems.  Other health problems.   AFTER THE PROCEDURE  After surgery, you will be taken to the recovery area where a nurse will monitor your progress. You will be allowed  to go home when you are awake, stable, taking fluids well, and without serious pain or complications.  For the first 24 hours following an anesthetic:  Have a responsible person with you.  Do not drive a car. If you are alone, do not take public transportation.  Do not engage in strenuous activity. You may usually resume normal activities the next day, or as advised by your caregiver.  Do not drink alcohol.  Do not take medicine that has not been prescribed by your caregiver.  Do not sign important papers or make important decisions as your judgement may be impaired.  You may resume a normal diet as directed.  Change bandages (dressings) as directed.  Only take over-the-counter or prescription medicines for pain, discomfort, or fever as directed by your caregiver.  If you have questions or problems that seem related to the anesthetic, call the hospital and ask for the anesthetist, anesthesiologist, or anesthesia department.   SEEK IMMEDIATE MEDICAL CARE IF:  You develop a rash.  You have difficulty breathing.  You have chest pain.  You have allergic problems.  You have uncontrolled nausea.  You have uncontrolled vomiting.  You develop any serious bleeding, especially from the incision site.      Post Anesthesia Home Care Instructions  Activity: Get plenty of rest for the remainder of the day. A responsible adult should stay with you for 24 hours following the procedure.  For the next 24 hours, DO NOT: -Drive a car -Paediatric nurse -Drink alcoholic beverages -Take any medication unless instructed by your physician -Make any legal decisions or sign important papers.  Meals: Start with liquid foods such as gelatin or soup. Progress to regular foods as tolerated. Avoid greasy, spicy, heavy foods. If nausea and/or vomiting occur, drink only clear liquids until the nausea and/or vomiting subsides. Call your physician if vomiting continues.  Special Instructions/Symptoms: Your  throat may feel dry or sore from the anesthesia or the breathing tube placed in your throat during surgery. If this causes discomfort, gargle with warm salt water. The discomfort should disappear within 24 hours.  If you had a scopolamine patch placed behind your ear for the management of post- operative nausea and/or vomiting:  1. The medication in the patch is effective for 72 hours, after which it should be removed.  Wrap patch in a tissue and discard in the trash. Wash hands thoroughly with soap and water. 2. You may remove the patch earlier than 72 hours if you experience unpleasant side effects which may include dry mouth, dizziness or visual disturbances. 3. Avoid touching the patch. Wash your hands with soap and water after contact with the patch.   Foley Catheter Care, Adult A Foley catheter is a soft, flexible tube. This tube is placed into your bladder to drain pee (urine). If you go home with this catheter in place, follow the instructions below. TAKING CARE OF THE CATHETER 1. Wash your hands with soap and water. 2. Put soap and water on a clean washcloth.  Clean the skin where the tube goes into your body.  Clean away from the tube site.  Never wipe toward the tube.  Clean the area using a  circular motion.  Remove all the soap. Pat the area dry with a clean towel. For males, reposition the skin that covers the end of the penis (foreskin). 3. Attach the tube to your leg with tape or a leg strap. Do not stretch the tube tight. If you are using tape, remove any stickiness left behind by past tape you used. 4. Keep the drainage bag below your hips. Keep it off the floor. 5. Check your tube during the day. Make sure it is working and draining. Make sure the tube does not curl, twist, or bend. 6. Do not pull on the tube or try to take it out. TAKING CARE OF THE DRAINAGE BAGS You will have a large overnight drainage bag and a small leg bag. You may wear the overnight bag any time.  Never wear the small bag at night. Follow the directions below. Emptying the Drainage Bag Empty your drainage bag when it is  - full or at least 2-3 times a day. 1. Wash your hands with soap and water. 2. Keep the drainage bag below your hips. 3. Hold the dirty bag over the toilet or clean container. 4. Open the pour spout at the bottom of the bag. Empty the pee into the toilet or container. Do not let the pour spout touch anything. 5. Clean the pour spout with a gauze pad or cotton ball that has rubbing alcohol on it. 6. Close the pour spout. 7. Attach the bag to your leg with tape or a leg strap. 8. Wash your hands well. Changing the Drainage Bag Change your bag once a month or sooner if it starts to smell or look dirty.  1. Wash your hands with soap and water. 2. Pinch the rubber tube so that pee does not spill out. 3. Disconnect the catheter tube from the drainage tube at the connection valve. Do not let the tubes touch anything. 4. Clean the end of the catheter tube with an alcohol wipe. Clean the end of a the drainage tube with a different alcohol wipe. 5. Connect the catheter tube to the drainage tube of the clean drainage bag. 6. Attach the new bag to the leg with tape or a leg strap. Avoid attaching the new bag too tightly. 7. Wash your hands well. Cleaning the Drainage Bag 1. Wash your hands with soap and water. 2. Wash the bag in warm, soapy water. 3. Rinse the bag with warm water. 4. Fill the bag with a mixture of white vinegar and water (1 cup vinegar to 1 quart warm water [.2 liter vinegar to 1 liter warm water]). Close the bag and soak it for 30 minutes in the solution. 5. Rinse the bag with warm water. 6. Hang the bag to dry with the pour spout open and hanging downward. 7. Store the clean bag (once it is dry) in a clean plastic bag. 8. Wash your hands well. PREVENT INFECTION  Wash your hands before and after touching your tube.  Take showers every day. Wash the skin  where the tube enters your body. Do not take baths. Replace wet leg straps with dry ones, if this applies.  Do not use powders, sprays, or lotions on the genital area. Only use creams, lotions, or ointments as told by your doctor.  For females, wipe from front to back after going to the bathroom.  Drink enough fluids to keep your pee clear or pale yellow unless you are told not to have too much fluid (fluid  restriction).  Do not let the drainage bag or tubing touch or lie on the floor.  Wear cotton underwear to keep the area dry. GET HELP IF:  Your pee is cloudy or smells unusually bad.  Your tube becomes clogged.  You are not draining pee into the bag or your bladder feels full.  Your tube starts to leak. GET HELP RIGHT AWAY IF:  You have pain, puffiness (swelling), redness, or yellowish-white fluid (pus) where the tube enters the body.  You have pain in the belly (abdomen), legs, lower back, or bladder.  You have a fever.  You see blood fill the tube, or your pee is pink or red.  You feel sick to your stomach (nauseous), throw up (vomit), or have chills.  Your tube gets pulled out. MAKE SURE YOU:   Understand these instructions.  Will watch your condition.  Will get help right away if you are not doing well or get worse.   This information is not intended to replace advice given to you by your health care provider. Make sure you discuss any questions you have with your health care provider.   Document Released: 04/07/2013 Document Revised: 01/01/2015 Document Reviewed: 04/07/2013 Elsevier Interactive Patient Education [employer].

## 2016-05-15 NOTE — Anesthesia Preprocedure Evaluation (Addendum)
Anesthesia Evaluation  Patient identified by MRN, date of birth, ID band Patient awake    Reviewed: Allergy & Precautions, NPO status , [name] Chart, lab work & pertinent test results  Airway Mallampati: I  TM Distance: >3 FB Neck ROM: Full    Dental  (+) Teeth Intact, Dental Advisory Given   Pulmonary COPD, former smoker,    breath sounds clear to auscultation       Cardiovascular + Peripheral Vascular Disease   Rhythm:Regular Rate:Normal     Neuro/Psych    GI/Hepatic GERD  Medicated and Controlled,  Endo/Other    Renal/GU      Musculoskeletal   Abdominal   Peds  Hematology   Anesthesia Other Findings 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  Now has loop recorder implanted and has been found to have intermittent A fib. He cannot detect when the dysrhythmia occurs and has been placed on Eliquis.   Reproductive/Obstetrics                            Anesthesia Physical Anesthesia Plan  ASA: III  Anesthesia Plan: General   Post-op Pain Management:    Induction: Intravenous  Airway Management Planned: LMA  Additional Equipment:   Intra-op Plan:   Post-operative Plan: Extubation in OR  Informed Consent: I have reviewed the patients History and Physical, chart, labs and discussed the procedure including the risks, benefits and alternatives for the proposed anesthesia with the patient or authorized representative who has indicated his/her understanding and acceptance.   Dental advisory given  Plan Discussed with: CRNA, Anesthesiologist and Surgeon  Anesthesia Plan Comments:         Anesthesia Quick Evaluation

## 2016-05-16 ENCOUNTER — Encounter (HOSPITAL_BASED_OUTPATIENT_CLINIC_OR_DEPARTMENT_OTHER): Payer: Self-pay | Admitting: Urology

## 2016-05-19 ENCOUNTER — Telehealth: Payer: Self-pay | Admitting: *Deleted

## 2016-05-19 LAB — CUP PACEART REMOTE DEVICE CHECK: Date Time Interrogation Session: 20170314130532

## 2016-05-19 NOTE — Telephone Encounter (Signed)
Attempted to reach patient on home phone (LM) and cell phone (no VM set up) to clarify Eliquis instructions post-bladder surgery.  Will review with Dr. Marlou Porch.

## 2016-05-19 NOTE — Telephone Encounter (Signed)
Was able to find patient's discharge instructions post-TURBT on 05/15/16, no need to review with Dr. Marlou Porch.  Instructions state to resume Eliquis when urine clear.  Added Eliquis back to patient's medication list.  Will confirm with patient that he is aware of these instructions when he returns call.

## 2016-05-19 NOTE — Telephone Encounter (Signed)
Patient returned phone call.  He states that he received the instructions at discharge after his surgery and he has been taking his Eliquis since Tuesday, 5/23, without issue.  He is appreciative of call and denies any questions or concerns at this time.

## 2016-05-21 LAB — CUP PACEART REMOTE DEVICE CHECK: Date Time Interrogation Session: 20170413133636

## 2016-05-21 NOTE — Progress Notes (Signed)
[facility] summary report received. Battery status OK. Normal device function. No new symptom episodes, tachy episodes, brady, or pause episodes. No new AF episodes. Monthly summary reports and ROV/PRN 

## 2016-05-29 ENCOUNTER — Encounter: Payer: Self-pay | Admitting: Nurse Practitioner

## 2016-06-01 ENCOUNTER — Other Ambulatory Visit (HOSPITAL_COMMUNITY): Payer: Medicare Other

## 2016-06-05 ENCOUNTER — Ambulatory Visit (INDEPENDENT_AMBULATORY_CARE_PROVIDER_SITE_OTHER): Payer: Medicare Other | Admitting: *Deleted

## 2016-06-05 DIAGNOSIS — I639 Cerebral infarction, unspecified: Secondary | ICD-10-CM

## 2016-06-05 NOTE — Progress Notes (Signed)
Carelink Summary Report / Loop Recorder 

## 2016-06-06 ENCOUNTER — Ambulatory Visit: Admit: 2016-06-06 | Payer: Self-pay | Admitting: Urology

## 2016-06-06 SURGERY — TURBT (TRANSURETHRAL RESECTION OF BLADDER TUMOR)
Anesthesia: General

## 2016-06-07 ENCOUNTER — Ambulatory Visit (INDEPENDENT_AMBULATORY_CARE_PROVIDER_SITE_OTHER): Payer: Medicare Other | Admitting: Diagnostic Neuroimaging

## 2016-06-07 ENCOUNTER — Encounter: Payer: Self-pay | Admitting: Diagnostic Neuroimaging

## 2016-06-07 VITALS — BP 121/73 | HR 65 | Ht 73.0 in | Wt 213.4 lb

## 2016-06-07 DIAGNOSIS — I639 Cerebral infarction, unspecified: Secondary | ICD-10-CM

## 2016-06-07 DIAGNOSIS — I712 Thoracic aortic aneurysm, without rupture, unspecified: Secondary | ICD-10-CM

## 2016-06-07 DIAGNOSIS — R42 Dizziness and giddiness: Secondary | ICD-10-CM

## 2016-06-07 DIAGNOSIS — I480 Paroxysmal atrial fibrillation: Secondary | ICD-10-CM | POA: Diagnosis not present

## 2016-06-07 DIAGNOSIS — R55 Syncope and collapse: Secondary | ICD-10-CM | POA: Diagnosis not present

## 2016-06-07 DIAGNOSIS — G450 Vertebro-basilar artery syndrome: Secondary | ICD-10-CM | POA: Diagnosis not present

## 2016-06-07 NOTE — Progress Notes (Signed)
[facility]  PATIENT: Kyle Owen DOB: 1941-02-15  REFERRING CLINICIAN: Asencion Noble  HISTORY FROM: patient and wife REASON FOR VISIT: follow up   HISTORICAL  CHIEF COMPLAINT:  Chief Complaint  Patient presents with  . Dizziness and giddiness    rm 7, wife- [name], "getting better at waiting to sit/stand to avoid dizziness"  . Follow-up    6 month    HISTORY OF PRESENT ILLNESS:   UPDATE 06/07/16: Since last visit, no more syncope events. Now dx'd with afib from [facility] (Dec 2016) and now on eliquis. 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 Also with recent bladder cx diagnosis (May 2017), s/p resection and chemo washing, and serial exams.   UPDATE 11/08/15: Since last visit, continues with intermittent dizzy spells. Also passed out while getting chest xray (11/03/15; arms raised, turning). BP was 85/45. He was dx'd with pneumonia and now on abx.   UPDATE 07/13/15: Since last visit, doing about the same. No new stroke symptoms. Right knee and left hip bother him.  PRIOR HPI (06/09/15): 75 year old right-handed male here for evaluation of balance difficulty, dizziness, possible stroke. 10/26/2014 patient had an episode where a "shade came over the right eye" and he lost vision completely for 15 minutes. Patient was about to go to the emergency room but then symptoms resolve. He saw his PCP the next day who ordered carotid ultrasound which was negative. Patient had no focal numbness, weakness, slurred speech, trouble talking, headache with that event. 04/09/2015, patient was at a sporting clay competition, and after the first day he was sleeping in his RV at night, then woke up with nausea and spinning sensation lasting for 10 minutes. Symptoms resolved. The next day patient was feeling back to normal. The next night after falling asleep, patient woke  up with similar symptoms for 10 minutes. This happened a third night in a row. No symptoms during the daytime. No subsequent long-term abnormal symptoms following this. 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 No ongoing headache, numbness, tingling, focal weakness. Patient has had generalized lack of grip strength in the bilateral hands over the past 10-15 years. He has had chronic low back pain problems, status post surgery in 2014, as well as right knee pain and problems.   REVIEW OF SYSTEMS: Full 14 system review of systems performed and negative except for joint pain dizziness easy bruising back pain.  ALLERGIES: Allergies  Allergen Reactions  . Flomax [Tamsulosin Hcl] Other (See Comments)    "Pt thought he was dying (dizzy, n/v)"    HOME MEDICATIONS: Outpatient Prescriptions Prior to Visit  Medication Sig Dispense Refill  . acetaminophen (TYLENOL) 500 MG tablet Take 500 mg by mouth daily.     Marland Kitchen atorvastatin (LIPITOR) 20 MG tablet Take 1 tablet (20 mg total) by mouth daily. 90 tablet 3  . budesonide-formoterol (SYMBICORT) 160-4.5 MCG/ACT inhaler Inhale 2 puffs into the lungs 2 (two) times daily.    Marland Kitchen ELIQUIS 5 MG TABS tablet     . finasteride (PROSCAR) 5 MG tablet Take 5 mg by mouth every morning.     . Magnesium 400 MG CAPS Take 1 capsule by mouth every morning.    . Multiple Vitamins-Minerals (CENTRUM SILVER PO) Take  1 tablet by mouth every morning.     . sertraline (ZOLOFT) 50 MG tablet Take 50 mg by mouth every morning.     . VENTOLIN HFA 108 (90 BASE) MCG/ACT inhaler Inhale 2 puffs into the lungs Every 4 hours as needed for wheezing or shortness of breath. Reported on 12/23/2015    . verapamil (CALAN-SR) 240 MG CR tablet Take 240 mg by mouth every morning.     . cephALEXin (KEFLEX) 500 MG capsule Take 1 capsule (500 mg total) by mouth  2 (two) times daily. 6 capsule 0  . Meth-Hyo-M Bl-Na Phos-Ph Sal (URIBEL) 118 MG CAPS Take 1 capsule (118 mg total) by mouth every 8 (eight) hours as needed. 20 capsule 0  . simvastatin (ZOCOR) 20 MG tablet Take 20 mg by mouth every evening.     No facility-administered medications prior to visit.    PAST MEDICAL HISTORY: Past Medical History  Diagnosis Date  . Depression   . Benign prostatic hypertrophy with urinary frequency   . History of colon polyps   . History of pneumonia     hx Recurrent -- last bout Dec 2016 CAP-- per pt resolved  . History of TIA (transient ischemic attack) no residual per pt    per neurologist note (dr Leta Baptist 11-08-2015) dx cryptogenic TIA versus arrhythia versus dysautonomia (right ophthalmic artery TIA and x3 posterior circulation TIAs  . Bladder cancer ([place])   . PAF (paroxysmal atrial fibrillation) ([place])     followed by AFIB clinic-- cardiologist-- dr Marlou Porch  . Renal cyst, left   . Nephrolithiasis     right non-obstructive  per CT 05-02-2016  . Thoracic aortic aneurysm without rupture ([place])     ascending -- ct chest 01/10/16  4.8cm  . Arthritis     DJD right knee  . Multinodular thyroid     per pathology report 11-12-2014 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   . Pulmonary nodule, left     left lower lobe x2 per CT 01-20-2016  . COPD with emphysema ([place])   . GERD (gastroesophageal reflux disease)   . Wears hearing aid     bilateral-- wear at times  . Hyperlipidemia   . Ocular migraine     controlled w/ verapamil    PAST SURGICAL HISTORY: Past Surgical History  Procedure Laterality Date  . Colonoscopy  10/03/2011    Procedure: COLONOSCOPY;  Surgeon: Jamesetta So;  [facility];  Service: Gastroenterology;  Laterality: N/A;  . Cataract extraction w/phaco  12/16/2012    Procedure: CATARACT EXTRACTION PHACO AND INTRAOCULAR LENS PLACEMENT (IOC);  Surgeon: Tonny Branch, MD;  Location: [facility];   Service: Ophthalmology;  Laterality: Left;  CDE:17.31  . Knee arthroscopy Right 2015  . Posterior lumbar fusion  2014  . Thyroidectomy Right 01/20/2015    Procedure: RIGHT THYROIDECTOMY;  Surgeon: Ascencion Dike, MD;  Location: [facility];  Service: ENT;  Laterality: Right;  . Tee without cardioversion N/A 07/27/2015    Procedure: TRANSESOPHAGEAL ECHOCARDIOGRAM (TEE);  Surgeon: Lelon Perla, MD;  Location: [facility] END[facility];  Service: Cardiovascular;  Laterality: N/A;   normal LV function, ef 55-60%,  mild AR and MR, mild dilated ascending aorta (4.2cm),  mild to moderate atherosclerosis  descending aorta,  mild to moderate TR,  negative saline microcavitation study  . 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 Shoulder arthroscopy Left 1990's  . Cataract extraction  w/ intraocular lens implant Right 2016  . Left cubital tunnel release    . Decompressioin ulnar nerve and cubital tunnel release Left 07-14-2009    elbow  . Tonsillectomy  as child  . Inguinal hernia repair Right 1990's  . Transurethral resection of bladder tumor N/A 05/15/2016    Procedure: TRANSURETHRAL RESECTION OF BLADDER TUMOR (TURBT) AND INSTILLATION OF EPIRUBICIN;  Surgeon: Franchot Gallo, MD;  Location[facility];  Service: Urology;  Laterality: N/A;    FAMILY HISTORY: Family History  Problem Relation Age of Onset  . Stroke Sister   . Breast cancer Sister   . Stroke Brother     SOCIAL HISTORY:  Social History   Social History  . Marital Status: Married    Spouse Name: N/A  . Number of Children: 3  . Years of Education: 14   Occupational History  . [employer]     retired   Social History Main Topics  . Smoking status: Former Smoker -- 1.00 packs/day for 45 years    Types: Cigarettes    Quit date: 09/26/2010  . Smokeless tobacco: Never Used  . Alcohol Use: No  . Drug Use: No  . Sexual Activity: Not on file   Other Topics Concern  .  Not on file   Social History Narrative   Widowed, lives with dog, [place] in [place], wife passed from Parkinson's disease 3 1/2 yrs ago   1 cup coffee daily     PHYSICAL EXAM  GENERAL EXAM/CONSTITUTIONAL: Vitals:  Filed Vitals:   06/07/16 0820  BP: 121/73  Pulse: 65  Height: 6\' 1"  (1.854 m)  Weight: 213 lb 6.4 oz (96.798 kg)   Body mass index is 28.16 kg/(m^2). 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  awake, alert, oriented to person, place and time  recent and remote memory intact  normal attention and concentration  language fluent, comprehension intact, naming intact,   fund of knowledge appropriate  CRANIAL NERVE:   2nd - no papilledema on fundoscopic exam  2nd, 3rd, 4th, 6th - pupils equal and reactive to light, visual fields full to confrontation, extraocular muscles intact, no nystagmus  5th - facial sensation symmetric  7th - facial strength symmetric  8th - hearing intact  9th - palate elevates symmetrically, uvula midline  11th - shoulder shrug symmetric  12th - tongue protrusion midline  MOTOR:   normal bulk and tone, full strength in the BUE, BLE; EXCEPT SLIGHT ATROPHY OF BILATERAL FIRST DORSAL INTEROSSEI HAND MUSCLES  SENSORY:   normal and symmetric to light touch, temperature, vibration; EXCEPT DECR VIB IN RIGHT FOOT  COORDINATION:    finger-nose-finger, fine finger movements normal  REFLEXES:   deep tendon reflexes present and symmetric  GAIT/STATION:   narrow based gait; ANTALGIC, LIMPING, DUE TO KNEE AND BACK PAIN; CANNOT WALK TOE, HEEL OR TANDEM; ROMBERG POSITIVE    DIAGNOSTIC DATA (LABS, IMAGING, TESTING) - I reviewed patient records, labs,  notes, testing and imaging myself where available.  Lab Results  Component Value Date   WBC 4.7 01/12/2015   HGB 14.3 05/15/2016   HCT 42.0 05/15/2016   MCV 96.2 01/12/2015   PLT 189 01/12/2015      Component Value Date/Time   NA 142 05/15/2016 0837   K 4.3 05/15/2016 0837   CL 107 01/12/2015 1106   CO2 29 01/12/2015 1106   GLUCOSE 96 05/15/2016 0837   BUN 22 06/09/2015 1051   BUN 20 01/12/2015 1106   CREATININE 1.50* 05/02/2016 0905   CALCIUM 9.4 01/12/2015 1106   GFRNONAA 45* 06/09/2015 1051   GFRAA 52* 06/09/2015 1051   Lab Results  Component Value Date   CHOL 225* 06/09/2015   HDL 68 06/09/2015   LDLCALC 137* 06/09/2015   TRIG 100 06/09/2015   CHOLHDL 3.3 06/09/2015   Lab Results  Component Value Date   HGBA1C 6.0* 06/09/2015   No results found for: VITAMINB12 No results found for: TSH   03/07/13 TTE  - Left ventricle: The cavity size was normal. Wall thickness was increased in a pattern of mild LVH. Systolic functionwas normal. The estimated ejection fraction was in therange of 55% to 60%. - Aortic valve: AV is thickened, calcified with minimallyrestricted motion Peak and mean gradients through thevalve are 11 and 43mm Hg respectively. Mild regurgitation. - Pulmonary arteries: PA peak pressure: 22mm Hg (S).  10/29/14 carotid u/s - No focal hemodynamically significant stenosis is noted. Incidental note is made of a right thyroid nodule. Full thyroid ultrasound may be helpful for further evaluation and to delineate any other thyroid nodules.  04/20/15 MRI brain [I reviewed images myself and agree with interpretation. Small right  cerebellar linear lesion, possible chronic lacunar infarct vs cerebellar folia. -VRP]  1.No acute intracranial abnormality. 2. Mild for age signal changes in the brain compatible with chronic small vessel disease.  06/21/15 MRA head - normal  06/21/15 MRA neck - normal  06/10/15 TEE - estimated ejection fraction was in the range of 60% to 65%. Wall motion was normal;there were no regional wall motion abnormalities. Compared to a prior echo in 2015, there are few changes. RVSP is lower at 29 mmHg.     ASSESSMENT AND PLAN  75 y.o. year old male here with episode of right amaurosis fugax in November 2015, and 3 episodes of nausea and vertigo in April 2016. 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 This combination of symptoms raises possibility for cardio embolic source. MRA head/neck and TTE are unremarkable.  Now found to have atrial fibrillation and now on eliquis (Dec 2016).   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    Penni Bombard, MD [id number], [id number] AM Certified in Neurology, Neurophysiology and Neuroimaging  [facility] [address], [place], [place] [address] [phone]

## 2016-06-13 ENCOUNTER — Other Ambulatory Visit (HOSPITAL_COMMUNITY)
Admission: RE | Admit: 2016-06-13 | Discharge: 2016-06-13 | Disposition: A | Discharge status: Home / Self Care | Payer: Medicare Other | Source: Ambulatory Visit | Attending: Urology | Admitting: Urology

## 2016-06-13 ENCOUNTER — Ambulatory Visit (INDEPENDENT_AMBULATORY_CARE_PROVIDER_SITE_OTHER): Payer: Medicare Other | Admitting: Urology

## 2016-06-13 DIAGNOSIS — C679 Malignant neoplasm of bladder, unspecified: Secondary | ICD-10-CM | POA: Insufficient documentation

## 2016-06-13 DIAGNOSIS — C670 Malignant neoplasm of trigone of bladder: Secondary | ICD-10-CM

## 2016-06-13 LAB — URINALYSIS, ROUTINE W REFLEX MICROSCOPIC
Bilirubin Urine: NEGATIVE
Glucose, UA: NEGATIVE mg/dL
Hgb urine dipstick: NEGATIVE
Ketones, ur: NEGATIVE mg/dL
Leukocytes, UA: NEGATIVE
Nitrite: NEGATIVE
Protein, ur: NEGATIVE mg/dL
Specific Gravity, Urine: 1.02 (ref 1.005–1.030)
pH: 5.5 (ref 5.0–8.0)

## 2016-06-13 LAB — CUP PACEART REMOTE DEVICE CHECK: Date Time Interrogation Session: 20170513133740

## 2016-06-20 DIAGNOSIS — M47816 Spondylosis without myelopathy or radiculopathy, lumbar region: Secondary | ICD-10-CM | POA: Diagnosis not present

## 2016-06-23 ENCOUNTER — Other Ambulatory Visit: Payer: Self-pay | Admitting: *Deleted

## 2016-06-23 DIAGNOSIS — I7121 Aneurysm of the ascending aorta, without rupture: Secondary | ICD-10-CM

## 2016-06-23 DIAGNOSIS — I712 Thoracic aortic aneurysm, without rupture: Secondary | ICD-10-CM

## 2016-06-29 LAB — CUP PACEART REMOTE DEVICE CHECK: Date Time Interrogation Session: 20170612140524

## 2016-07-05 ENCOUNTER — Ambulatory Visit (INDEPENDENT_AMBULATORY_CARE_PROVIDER_SITE_OTHER): Payer: Medicare Other | Admitting: *Deleted

## 2016-07-05 DIAGNOSIS — I639 Cerebral infarction, unspecified: Secondary | ICD-10-CM

## 2016-07-05 NOTE — Progress Notes (Signed)
Carelink Summary Report / Loop Recorder 

## 2016-07-11 DIAGNOSIS — I712 Thoracic aortic aneurysm, without rupture: Secondary | ICD-10-CM | POA: Diagnosis not present

## 2016-07-11 DIAGNOSIS — R918 Other nonspecific abnormal finding of lung field: Secondary | ICD-10-CM | POA: Diagnosis not present

## 2016-07-11 DIAGNOSIS — M4696 Unspecified inflammatory spondylopathy, lumbar region: Secondary | ICD-10-CM | POA: Diagnosis not present

## 2016-07-11 DIAGNOSIS — M1612 Unilateral primary osteoarthritis, left hip: Secondary | ICD-10-CM | POA: Diagnosis not present

## 2016-07-12 ENCOUNTER — Ambulatory Visit
Admission: RE | Admit: 2016-07-12 | Discharge: 2016-07-12 | Disposition: A | Discharge status: Home / Self Care | Payer: Medicare Other | Source: Ambulatory Visit | Attending: Cardiothoracic Surgery | Admitting: Cardiothoracic Surgery

## 2016-07-12 ENCOUNTER — Ambulatory Visit: Payer: Medicare Other | Admitting: Cardiothoracic Surgery

## 2016-07-12 DIAGNOSIS — I712 Thoracic aortic aneurysm, without rupture: Secondary | ICD-10-CM | POA: Diagnosis not present

## 2016-07-12 DIAGNOSIS — I7121 Aneurysm of the ascending aorta, without rupture: Secondary | ICD-10-CM

## 2016-07-12 MED ORDER — IOPAMIDOL (ISOVUE-370) INJECTION 76%: 75.0000 mL | Freq: Once | INTRAVENOUS | Status: DC | PRN

## 2016-07-15 LAB — CUP PACEART REMOTE DEVICE CHECK: Date Time Interrogation Session: 20170712150656

## 2016-07-18 ENCOUNTER — Other Ambulatory Visit (HOSPITAL_COMMUNITY): Payer: Self-pay | Admitting: Internal Medicine

## 2016-07-18 DIAGNOSIS — R918 Other nonspecific abnormal finding of lung field: Secondary | ICD-10-CM

## 2016-07-20 DIAGNOSIS — M1612 Unilateral primary osteoarthritis, left hip: Secondary | ICD-10-CM | POA: Diagnosis not present

## 2016-07-26 ENCOUNTER — Ambulatory Visit (HOSPITAL_COMMUNITY)
Admission: RE | Admit: 2016-07-26 | Discharge: 2016-07-26 | Disposition: A | Discharge status: Home / Self Care | Payer: Medicare Other | Source: Ambulatory Visit | Attending: Internal Medicine | Admitting: Internal Medicine

## 2016-07-26 DIAGNOSIS — D71 Functional disorders of polymorphonuclear neutrophils: Secondary | ICD-10-CM | POA: Insufficient documentation

## 2016-07-26 DIAGNOSIS — I2510 Atherosclerotic heart disease of native coronary artery without angina pectoris: Secondary | ICD-10-CM | POA: Insufficient documentation

## 2016-07-26 DIAGNOSIS — R918 Other nonspecific abnormal finding of lung field: Secondary | ICD-10-CM

## 2016-07-26 DIAGNOSIS — I700 Atherosclerosis of aorta: Secondary | ICD-10-CM | POA: Diagnosis not present

## 2016-07-26 DIAGNOSIS — I712 Thoracic aortic aneurysm, without rupture: Secondary | ICD-10-CM | POA: Insufficient documentation

## 2016-08-04 ENCOUNTER — Ambulatory Visit (INDEPENDENT_AMBULATORY_CARE_PROVIDER_SITE_OTHER): Payer: Medicare Other | Admitting: *Deleted

## 2016-08-04 DIAGNOSIS — I639 Cerebral infarction, unspecified: Secondary | ICD-10-CM | POA: Diagnosis not present

## 2016-08-07 NOTE — Progress Notes (Signed)
Carelink Summary Report / Loop Recorder 

## 2016-08-09 ENCOUNTER — Ambulatory Visit (INDEPENDENT_AMBULATORY_CARE_PROVIDER_SITE_OTHER): Payer: Medicare Other | Admitting: Cardiothoracic Surgery

## 2016-08-09 ENCOUNTER — Encounter: Payer: Self-pay | Admitting: Cardiothoracic Surgery

## 2016-08-09 VITALS — BP 127/81 | HR 60 | Resp 16 | Ht 73.0 in | Wt 216.4 lb

## 2016-08-09 DIAGNOSIS — I712 Thoracic aortic aneurysm, without rupture, unspecified: Secondary | ICD-10-CM

## 2016-08-09 DIAGNOSIS — I639 Cerebral infarction, unspecified: Secondary | ICD-10-CM

## 2016-08-09 NOTE — Progress Notes (Signed)
PCP is Asencion Noble, MD Referring Provider is Asencion Noble, MD  Chief Complaint  Patient presents with  . TAA    6 month f/u with CTA CHEST    HPI: 6 month follow-up for this 75 year old Caucasian male with a asymptomatic moderate ascending thoracic aortic aneurysm measuring 4. 7- 4 .8 cm in diameter. This was an incidental finding on CT scan evaluating pulmonary granuloma. The patient is a reformed smoker and has paroxysmal atrial fibrillation treated with verapamil and Elliquis. Since his last exam he has had no chest pain. His blood pressure is been well controlled. He takes his blood pressure medications. He goes to the [employer] [facility] for workout twice a week lifting no more than 30 pounds.  CT of thoracic aorta performed within the past 2 weeks person reviewed and counseled with patient. There is been no change in his mild fusiform ascending aneurysm. He remains at 4.7-4.8 cm. His small bilateral granulomata remained stable, no evidence of at risk pulmonary nodule or mediastinal adenopathy   Past Medical History:  Diagnosis Date  . Arthritis    DJD right knee  . Benign prostatic hypertrophy with urinary frequency   . Bladder cancer ([place])   . COPD with emphysema ([place])   . Depression   . GERD (gastroesophageal reflux disease)   . History of colon polyps   . History of pneumonia    hx Recurrent -- last bout Dec 2016 CAP-- per pt resolved  . 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 Hyperlipidemia   . 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 Nephrolithiasis    right non-obstructive  per CT 05-02-2016  . Ocular migraine    controlled w/ verapamil  . PAF (paroxysmal atrial fibrillation) ([place])    followed by AFIB clinic-- cardiologist-- dr Marlou Porch  . Pulmonary nodule, left    left lower lobe x2 per CT 01-20-2016  . Renal cyst, left   . Thoracic aortic aneurysm without rupture ([place])    ascending -- ct chest 01/10/16  4.8cm  . Wears hearing aid    bilateral-- wear at times    Past Surgical History:  Procedure Laterality Date  . CATARACT EXTRACTION W/ INTRAOCULAR LENS IMPLANT Right 2016  . CATARACT EXTRACTION W/PHACO  12/16/2012   Procedure: CATARACT EXTRACTION PHACO AND INTRAOCULAR LENS PLACEMENT (IOC);  Surgeon: Tonny Branch, MD;  Location: [facility];  Service: Ophthalmology;  Laterality: Left;  CDE:17.31  . COLONOSCOPY  10/03/2011   Procedure: COLONOSCOPY;  Surgeon: Jamesetta So;  [facility];  Service: Gastroenterology;  Laterality: N/A;  . DECOMPRESSIOIN ULNAR NERVE AND CUBITAL TUNNEL RELEASE Left 07-14-2009   elbow  . 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 INGUINAL HERNIA REPAIR Right 1990's  . KNEE ARTHROSCOPY Right 2015  . LEFT CUBITAL TUNNEL RELEASE    . POSTERIOR LUMBAR FUSION  2014  . SHOULDER ARTHROSCOPY Left 1990's  . TEE WITHOUT CARDIOVERSION N/A 07/27/2015   Procedure: TRANSESOPHAGEAL ECHOCARDIOGRAM (TEE);  Surgeon: Lelon Perla, MD;  Location: [facility];  Service: Cardiovascular;  Laterality: N/A;   normal LV function, ef 55-60%,  mild AR and MR, mild dilated ascending aorta (4.2cm),  mild to moderate atherosclerosis  descending aorta,  mild to moderate TR,  negative saline microcavitation study  . THYROIDECTOMY Right 01/20/2015   Procedure: RIGHT THYROIDECTOMY;  Surgeon: Ascencion Dike, MD;  Location: [facility];  Service: ENT;  Laterality: Right;  . TONSILLECTOMY  as child  . TRANSURETHRAL RESECTION OF BLADDER TUMOR N/A 05/15/2016   Procedure: TRANSURETHRAL RESECTION OF BLADDER TUMOR (TURBT) AND INSTILLATION OF EPIRUBICIN;  Surgeon: Franchot Gallo, MD;  Location: [facility];  Service:  Urology;  Laterality: N/A;    Family History  Problem Relation Age of Onset  . Stroke Sister   . Breast cancer Sister   . Stroke Brother     Social History Social History  Substance Use Topics  . Smoking status: Former Smoker    Packs/day: 1.00    Years: 45.00    Types: Cigarettes    Quit date: 09/26/2010  . Smokeless tobacco: Never Used  . Alcohol use No    Current Outpatient Prescriptions  Medication Sig Dispense Refill  . acetaminophen (TYLENOL) 500 MG tablet Take 500 mg by mouth daily.     Marland Kitchen atorvastatin (LIPITOR) 20 MG tablet Take 1 tablet (20 mg total) by mouth daily. 90 tablet 3  . budesonide-formoterol (SYMBICORT) 160-4.5 MCG/ACT inhaler Inhale 2 puffs into the lungs 2 (two) times daily.    Marland Kitchen ELIQUIS 5 MG TABS tablet     . finasteride (PROSCAR) 5 MG tablet Take 5 mg by mouth every morning.     . Magnesium 400 MG CAPS Take 1 capsule by mouth every morning.    . Multiple Vitamins-Minerals (CENTRUM SILVER PO) Take 1 tablet by mouth every morning.     . sertraline (ZOLOFT) 50 MG tablet Take 50 mg by mouth every morning.     . VENTOLIN HFA 108 (90 BASE) MCG/ACT inhaler Inhale 2 puffs into the lungs Every 4 hours as needed for wheezing or shortness of breath. Reported on 12/23/2015    . verapamil (CALAN-SR) 240 MG CR tablet Take 240 mg by mouth every morning.      No current facility-administered medications for this visit.     Allergies  Allergen Reactions  . Flomax [Tamsulosin Hcl] Other (See Comments)    "Pt thought he was dying (dizzy, n/v)"    Review of Systems        Review of Systems :  [ y ] = yes, [  ] = no        General :  Weight gain [ yes 6 pounds in last year  ]    Weight loss  [   ]  Fatigue [  ]  Fever [  ]  Chills  [  ]                                Weakness  [  ]           HEENT    Headache [  ]  Dizziness [  ]  Blurred vision [  ] Glaucoma  [  ]                          Nosebleeds [  ] Painful or loose teeth [  ]        Cardiac :  Chest pain/  pressure [  ]  Resting SOB [  ] exertional SOB [  ]  Orthopnea [  ]  Pedal edema  [  ]  Palpitations [  ] Syncope/presyncope [ ]                         Paroxysmal nocturnal dyspnea [  ]         Pulmonary : cough [  ]  wheezing [  ]  Hemoptysis [  ] Sputum [  ] Snoring [  ]                              Pneumothorax [  ]  Sleep apnea [  ]        GI : Vomiting [  ]  Dysphagia [  ]  Melena  [  ]  Abdominal pain [  ] BRBPR [  ]              Heart burn [  ]  Constipation [  ] Diarrhea  [  ] Colonoscopy [   ]        GU : Hematuria [  ]  Dysuria [  ]  Nocturia [  ] UTI's [  ]        Vascular : Claudication [  ]  Rest pain [  ]  DVT [  ] Vein stripping [  ] leg ulcers [  ]                          TIA [  ] Stroke [  ]  Varicose veins [ yes-bilateral ankle swelling stable and chronic ]        NEURO :  Headaches  [  ] Seizures [  ] Vision changes [  ] Paresthesias [  ]                                       Seizures [  ]        Musculoskeletal :  Arthritis [ yes diffuse ] Gout  [  ]  Back pain [  ]  Joint pain [  ]        Skin :  Rash [  ]  Melanoma [  ] Sores [  ]        Heme : Bleeding problems [  ]Clotting Disorders [  ] Anemia [  ]Blood Transfusion [ ]         Endocrine : Diabetes [  ] Heat or Cold intolerance [  ] Polyuria [  ]excessive thirst [ ]         Psych : Depression [  ]  Anxiety [  ]  Psych hospitalizations [  ] Memory change [  ]                                               BP 127/81   Pulse 60   Resp 16   Ht 6\' 1"  (1.854 m)   Wt 216 lb 6.4 oz (98.2 kg)   SpO2 93% Comment: ON RA  BMI 28.55 kg/m  Physical Exam       Exam    General- alert and comfortable   Lungs-  clear without rales, wheezes   Cor- regular rate and rhythm, no murmur , gallop   Abdomen- soft, non-tender   Extremities - warm, non-tender, minimal edema   Neuro- oriented, appropriate, no focal weakness   Diagnostic Tests: CTA shows smooth 4.8 cm fusiform ascending aneurysm  unchanged from last CT scan 6 months ago  Impression: Moderate ascending aneurysm. No surgery indicated unless diameter exceeds 5.5 cm or the aneurysm shows a rapid growth pattern or become symptomatic. Best treatment is continued blood pressure control, heart healthy diet, regular exercise, and smoking cessation. His risk of proceeding to a surgical situation for his aneurysm is low at this time.  Plan:Continue with surveillance CT scans  -next ct scan will be scheduled for one year - CTA of thoracic aorta.   Len Childs, MD [facility] [phone]

## 2016-08-24 DIAGNOSIS — C679 Malignant neoplasm of bladder, unspecified: Secondary | ICD-10-CM | POA: Diagnosis not present

## 2016-08-24 DIAGNOSIS — J441 Chronic obstructive pulmonary disease with (acute) exacerbation: Secondary | ICD-10-CM | POA: Diagnosis not present

## 2016-08-24 DIAGNOSIS — I480 Paroxysmal atrial fibrillation: Secondary | ICD-10-CM | POA: Diagnosis not present

## 2016-09-02 LAB — CUP PACEART REMOTE DEVICE CHECK: Date Time Interrogation Session: 20170811150948

## 2016-09-04 ENCOUNTER — Ambulatory Visit (INDEPENDENT_AMBULATORY_CARE_PROVIDER_SITE_OTHER): Payer: Medicare Other | Admitting: *Deleted

## 2016-09-04 DIAGNOSIS — I639 Cerebral infarction, unspecified: Secondary | ICD-10-CM | POA: Diagnosis not present

## 2016-09-05 NOTE — Progress Notes (Signed)
Carelink Summary Report / Loop Recorder 

## 2016-09-12 ENCOUNTER — Ambulatory Visit (INDEPENDENT_AMBULATORY_CARE_PROVIDER_SITE_OTHER): Payer: Medicare Other | Admitting: Urology

## 2016-09-12 DIAGNOSIS — C670 Malignant neoplasm of trigone of bladder: Secondary | ICD-10-CM

## 2016-09-30 LAB — CUP PACEART REMOTE DEVICE CHECK: Date Time Interrogation Session: 20170910150855

## 2016-09-30 NOTE — Progress Notes (Signed)
Carelink summary report received. Battery status OK. Normal device function. No new symptom episodes, tachy episodes, brady, or pause episodes. 1 AF episode, +Eliquis. Monthly summary reports and ROV/PRN 

## 2016-10-04 ENCOUNTER — Ambulatory Visit (INDEPENDENT_AMBULATORY_CARE_PROVIDER_SITE_OTHER): Payer: Medicare Other | Admitting: *Deleted

## 2016-10-04 DIAGNOSIS — I639 Cerebral infarction, unspecified: Secondary | ICD-10-CM | POA: Diagnosis not present

## 2016-10-05 ENCOUNTER — Other Ambulatory Visit (HOSPITAL_COMMUNITY): Payer: Self-pay | Admitting: Internal Medicine

## 2016-10-05 ENCOUNTER — Ambulatory Visit (HOSPITAL_COMMUNITY)
Admission: RE | Admit: 2016-10-05 | Discharge: 2016-10-05 | Disposition: A | Discharge status: Home / Self Care | Payer: Medicare Other | Source: Ambulatory Visit | Attending: Internal Medicine | Admitting: Internal Medicine

## 2016-10-05 DIAGNOSIS — M79671 Pain in right foot: Secondary | ICD-10-CM | POA: Diagnosis not present

## 2016-10-05 DIAGNOSIS — Z23 Encounter for immunization: Secondary | ICD-10-CM | POA: Diagnosis not present

## 2016-10-05 DIAGNOSIS — M25571 Pain in right ankle and joints of right foot: Secondary | ICD-10-CM | POA: Diagnosis not present

## 2016-10-05 NOTE — Progress Notes (Signed)
Carelink Summary Repot / Loop Recorder

## 2016-10-26 ENCOUNTER — Encounter: Payer: Self-pay | Admitting: Cardiology

## 2016-11-02 ENCOUNTER — Ambulatory Visit (INDEPENDENT_AMBULATORY_CARE_PROVIDER_SITE_OTHER): Payer: Medicare Other | Admitting: *Deleted

## 2016-11-02 DIAGNOSIS — I639 Cerebral infarction, unspecified: Secondary | ICD-10-CM | POA: Diagnosis not present

## 2016-11-02 NOTE — Progress Notes (Signed)
Carelink Summary Report / Loop Recorder 

## 2016-11-05 LAB — CUP PACEART REMOTE DEVICE CHECK
Date Time Interrogation Session: 20171010153727
Implantable Pulse Generator Implant Date: 20160816

## 2016-11-05 NOTE — Progress Notes (Signed)
[facility] summary report received. Battery status OK. Normal device function. No new symptom episodes, tachy episodes, brady, or pause episodes. No new AF episodes. Monthly summary reports and ROV/PRN 

## 2016-11-10 ENCOUNTER — Ambulatory Visit: Payer: Medicare Other | Admitting: Cardiology

## 2016-11-30 ENCOUNTER — Ambulatory Visit (INDEPENDENT_AMBULATORY_CARE_PROVIDER_SITE_OTHER): Payer: Medicare Other | Admitting: Cardiology

## 2016-11-30 ENCOUNTER — Encounter: Payer: Self-pay | Admitting: Cardiology

## 2016-11-30 VITALS — BP 116/62 | HR 68 | Ht 73.0 in | Wt 215.4 lb

## 2016-11-30 DIAGNOSIS — I7121 Aneurysm of the ascending aorta, without rupture: Secondary | ICD-10-CM

## 2016-11-30 DIAGNOSIS — I712 Thoracic aortic aneurysm, without rupture: Secondary | ICD-10-CM

## 2016-11-30 DIAGNOSIS — I639 Cerebral infarction, unspecified: Secondary | ICD-10-CM | POA: Diagnosis not present

## 2016-11-30 DIAGNOSIS — I480 Paroxysmal atrial fibrillation: Secondary | ICD-10-CM | POA: Diagnosis not present

## 2016-11-30 NOTE — Patient Instructions (Signed)
Medication Instructions:  The current medical regimen is effective;  continue present plan and medications.  Follow-Up: Follow up in 6 months with Katy Thompson, PA.  You will receive a letter in the mail 2 months before you are due.  Please call us when you receive this letter to schedule your follow up appointment.  Follow up in 1 year with Dr. Skains.  You will receive a letter in the mail 2 months before you are due.  Please call us when you receive this letter to schedule your follow up appointment.  If you need a refill on your cardiac medications before your next appointment, please call your pharmacy.  Thank you for choosing [facility]!!     

## 2016-11-30 NOTE — Progress Notes (Signed)
Cardiology Office Note    Date:  11/30/2016   ID:  SHIZUO GIACHETTI, DOB 1941-04-19, MRN [id number]  PCP:  Asencion Noble, MD  Cardiologist:   Candee Furbish, MD , EP-Dr. Rayann Heman    History of Present Illness:  Kyle Owen is a 75 y.o. male previously seen by Dr. 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 Darcey Nora here to establish care. Previously had only seen a letter physiology clinic. He was previously on verapamil for ocular migraines. Low atrial fibrillation burden of 2.5%.  Bladder cancer - COPD - baseline SOB. Uphill walking. No chest pain. He is able to complete greater than 4 METS of activity without difficulty.   18min AFIB episode and 15 min episode recorded on monitor. No recent syncope (series did have one episode when raising his arms above his head in radiology last year, orthostatic). Memory issues.   Recently married New Years Eve 2016. Wife here.   Because of prior TIA symptoms, chadsvasc score of at least 4.   Has chronic back pain, leg pain. Cramping at night.  Has runny nose when exercising.    Past Medical History:  Diagnosis Date  . Arthritis    DJD right knee  . Benign prostatic hypertrophy with urinary frequency   . Bladder cancer ([place])   . COPD with emphysema ([place])   . Depression   . GERD (gastroesophageal reflux disease)   . History of colon polyps   . History of pneumonia    hx Recurrent -- last bout Dec 2016 CAP-- per pt resolved  . 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 Hyperlipidemia   . 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 Nephrolithiasis    right non-obstructive  per CT 05-02-2016  . Ocular migraine    controlled w/ verapamil  . PAF (paroxysmal atrial fibrillation) ([place])    followed by AFIB clinic-- cardiologist-- dr Marlou Porch  . Pulmonary nodule, left    left lower lobe x2 per CT 01-20-2016  . Renal cyst, left   . Thoracic aortic aneurysm without rupture ([place])    ascending -- ct chest 01/10/16  4.8cm  . Wears hearing aid    bilateral-- wear at times    Past Surgical History:  Procedure Laterality Date  . CATARACT EXTRACTION W/ INTRAOCULAR LENS IMPLANT Right 2016  . CATARACT EXTRACTION W/PHACO  12/16/2012   Procedure: CATARACT EXTRACTION PHACO AND INTRAOCULAR LENS PLACEMENT (IOC);  Surgeon: Tonny Branch, MD;  Location: [facility];  Service: Ophthalmology;  Laterality: Left;  CDE:17.31  . COLONOSCOPY  10/03/2011   Procedure: COLONOSCOPY;  Surgeon: Jamesetta So;  [facility];  Service: Gastroenterology;  Laterality: N/A;  . DECOMPRESSIOIN ULNAR NERVE AND CUBITAL TUNNEL RELEASE Left 07-14-2009   elbow  . 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 INGUINAL HERNIA REPAIR Right 1990's  . KNEE ARTHROSCOPY Right 2015  . LEFT CUBITAL TUNNEL RELEASE    . POSTERIOR LUMBAR FUSION  2014  . SHOULDER ARTHROSCOPY Left 1990's  . TEE WITHOUT CARDIOVERSION N/A 07/27/2015   Procedure: TRANSESOPHAGEAL ECHOCARDIOGRAM (TEE);  Surgeon: Lelon Perla, MD;  [facility];  Service: Cardiovascular;  Laterality: N/A;   normal LV function, ef 55-60%,  mild AR and MR, mild dilated ascending aorta (4.2cm),  mild to moderate atherosclerosis  descending aorta,  mild to moderate TR,  negative saline microcavitation study  . THYROIDECTOMY Right 01/20/2015   Procedure: RIGHT THYROIDECTOMY;  Surgeon: Ascencion Dike, MD;  Location: [facility];  Service: ENT;  Laterality: Right;  . TONSILLECTOMY  as child  . TRANSURETHRAL RESECTION OF BLADDER TUMOR N/A 05/15/2016   Procedure: TRANSURETHRAL RESECTION OF  BLADDER TUMOR (TURBT) AND INSTILLATION OF EPIRUBICIN;  Surgeon: Franchot Gallo, MD;  [facility]: [facility];  Service: Urology;  Laterality: N/A;    Current Medications: Outpatient Medications Prior to Visit  Medication Sig Dispense Refill  . acetaminophen (TYLENOL) 500 MG tablet Take 500 mg by mouth daily.     Marland Kitchen atorvastatin (LIPITOR) 20 MG tablet Take 1 tablet (20 mg total) by mouth daily. 90 tablet 3  . budesonide-formoterol (SYMBICORT) 160-4.5 MCG/ACT inhaler Inhale 2 puffs into the lungs 2 (two) times daily.    Marland Kitchen ELIQUIS 5 MG TABS tablet     . finasteride (PROSCAR) 5 MG tablet Take 5 mg by mouth every morning.     . Magnesium 400 MG CAPS Take 1 capsule by mouth every morning.    . Multiple Vitamins-Minerals (CENTRUM SILVER PO) Take 1 tablet by mouth every morning.     . sertraline (ZOLOFT) 50 MG tablet Take 50 mg by mouth every morning.     . VENTOLIN HFA 108 (90 BASE) MCG/ACT inhaler Inhale 2 puffs into the lungs Every 4 hours as needed for wheezing or shortness of breath. Reported on 12/23/2015    . verapamil (CALAN-SR) 240 MG CR tablet Take 240 mg by mouth every morning.      No facility-administered medications prior to visit.      Allergies:   Flomax [tamsulosin hcl]   Social History   Social History  . Marital status: Married    Spouse name: N/A  . Number of children: 3  . Years of education: 14   Occupational History  . [employer]     retired   Social History Main Topics  . Smoking status: Former Smoker    Packs/day: 1.00    Years: 45.00    Types: Cigarettes    Quit date: 09/26/2010  . Smokeless tobacco: Never Used  . Alcohol use No  . Drug use: No  . Sexual activity: Not Asked   Other Topics Concern  . None   Social History Narrative   Widowed, lives with dog, Jake in [place], wife passed from Parkinson's disease 3 1/2 yrs ago   1 cup coffee daily     Family History:  The patient's family history includes Breast cancer in his  sister; Stroke in his brother and sister.   ROS:   Please see the history of present illness.    ROS All other systems reviewed and are negative.   PHYSICAL EXAM:   VS:  BP 116/62   Pulse 68   Ht 6\' 1"  (1.854 m)   Wt 215 lb 6.4 oz (97.7 kg)   BMI 28.42 kg/m    GEN: Well nourished, well developed, in no acute distress HEENT: normal Neck: no JVD, carotid bruits, or masses Cardiac: RRR; no murmurs, rubs, or gallops,no edema  Respiratory:  clear to auscultation bilaterally, normal work of breathing GI: soft, nontender, nondistended, + BS MS: no deformity or atrophy Skin:  warm and dry, no rash Neuro:  Alert and Oriented x 3, Strength and sensation are intact Psych: euthymic mood, full affect  Wt Readings from Last 3 Encounters:  11/30/16 215 lb 6.4 oz (97.7 kg)  08/09/16 216 lb 6.4 oz (98.2 kg)  06/07/16 213 lb 6.4 oz (96.8 kg)      Studies/Labs Reviewed:   EKG:  EKG is not ordered today.  01/31/16-sinus rhythm, 78, septal infarct pattern personally viewed  Recent Labs: 05/02/2016: Creatinine, Ser 1.50 05/15/2016: Hemoglobin 14.3; Potassium 4.3; Sodium 142   Lipid Panel    Component Value Date/Time   CHOL 225 (H) 06/09/2015 1040   TRIG 100 06/09/2015 1040   HDL 68 06/09/2015 1040   CHOLHDL 3.3 06/09/2015 1040   LDLCALC 137 (H) 06/09/2015 1040    Additional studies/ records that were reviewed today include:  CT of chest January 2017-4.8 cm ascending aortic aneurysm  Echocardiogram 06/10/15: - Left ventricle: The cavity size was normal. Wall thickness was  normal. Systolic function was normal. The estimated ejection  fraction was in the range of 60% to 65%. Wall motion was normal;  there were no regional wall motion abnormalities. Doppler  parameters are consistent with abnormal left ventricular  relaxation (grade 1 diastolic dysfunction). The E/e&' ratio is <8,  suggesting normal LV filling pressure. - Aortic valve: Trileaflet. Sclerosis without stenosis. There  was  trivial regurgitation. - Left atrium: The atrium was normal in size. - Right atrium: Moderately dilated at 25 cm2. - Tricuspid valve: There was mild regurgitation. - Pulmonary arteries: PA peak pressure: 29 mm Hg (S). - Inferior vena cava: The vessel was normal in size. The  respirophasic diameter changes were in the normal range (= 50%),  consistent with normal central venous pressure.  Impressions:  - Compared to a prior echo in 2015, there are few changes. RVSP is  lower at 29 mmHg.  Prior lab work, office notes, EKG, echo and reviewed    ASSESSMENT:    1. Paroxysmal atrial fibrillation (HCC)   2. Cryptogenic stroke ([place])   3. Thoracic ascending aortic aneurysm (HCC)      PLAN:  In order of problems listed above:  Paroxysmal atrial fibrillation  - On Eliquis, CHADS VASC - 4 (prior TIA)   - Low burden noted on Linq (2.5%) (11hr, 52min), ASYMPTOMATIC  - On verapamil for rate control as well as migraine prophylaxis.  - Prior cryptogenic stroke  History of cryptogenic stroke  - On anticoagulation now that atrial fibrillation has been discovered  Ascending aortic aneurysm, thoracic  - Followed by Dr. Darcey Nora (he is seeing him every 6 months)  - 4.8 cm 2017. CT scan. Personally viewed. No change  Hyperlipidemia  - Prior LDL 137, on simvastatin 20 mg. Changing to atorvastatin 20. Watch for any adverse symptoms. He does have baseline cramping.  Bladder cancer  -  Stable  COPD  - At baseline. Former smoker, quit in 2011.  Watch skin lesion on left forearm-he will be addressing with Dr. Willey Blade if necessary  Rhinorrhea-possible seasonal allergies-consider Claritin but we will defer to Dr. Willey Blade.  Medication Adjustments/Labs and Tests Ordered: Current medicines are reviewed at length with the patient today.  Concerns regarding medicines are outlined above.  Medication changes, Labs and Tests ordered today are listed in the Patient Instructions  below. Patient Instructions  Medication Instructions:  The current medical regimen is effective;  continue present plan and medications.  Follow-Up: Follow up in 6 months with Bonney Leitz, PA.  You will receive a letter in the mail 2 months before you are due.  Please call us when you receive this letter to schedule your follow up appointment.  Follow up in 1 year with Dr. Marlou Porch.  You will receive a letter in the mail 2 months before you are due.  Please call us when you receive this letter to schedule your follow up appointment.  If you need a refill on your cardiac medications before your next appointment, please call your pharmacy.  Thank you for choosing [facility]!!        Signed, Candee Furbish, MD  11/30/2016 8:54 AM    [facility] [address], [place], [place]  [address] Phone: [phone]; Fax: [phone]

## 2016-12-04 ENCOUNTER — Ambulatory Visit (INDEPENDENT_AMBULATORY_CARE_PROVIDER_SITE_OTHER): Payer: Medicare Other | Admitting: *Deleted

## 2016-12-04 DIAGNOSIS — I639 Cerebral infarction, unspecified: Secondary | ICD-10-CM

## 2016-12-05 NOTE — Progress Notes (Signed)
Carelink Summary Report / Loop Recorder 

## 2016-12-20 LAB — CUP PACEART REMOTE DEVICE CHECK
Date Time Interrogation Session: 20171109161128
Implantable Pulse Generator Implant Date: 20160816

## 2016-12-26 DIAGNOSIS — J209 Acute bronchitis, unspecified: Secondary | ICD-10-CM | POA: Diagnosis not present

## 2017-01-01 ENCOUNTER — Ambulatory Visit (INDEPENDENT_AMBULATORY_CARE_PROVIDER_SITE_OTHER): Payer: Medicare Other | Admitting: *Deleted

## 2017-01-01 ENCOUNTER — Other Ambulatory Visit: Payer: Self-pay | Admitting: Internal Medicine

## 2017-01-01 DIAGNOSIS — I639 Cerebral infarction, unspecified: Secondary | ICD-10-CM

## 2017-01-02 ENCOUNTER — Ambulatory Visit (INDEPENDENT_AMBULATORY_CARE_PROVIDER_SITE_OTHER): Payer: Medicare Other | Admitting: Urology

## 2017-01-02 DIAGNOSIS — C670 Malignant neoplasm of trigone of bladder: Secondary | ICD-10-CM | POA: Diagnosis not present

## 2017-01-02 NOTE — Progress Notes (Signed)
Carelink Summary Report / Loop Recorder 

## 2017-01-04 DIAGNOSIS — H5203 Hypermetropia, bilateral: Secondary | ICD-10-CM | POA: Diagnosis not present

## 2017-01-04 DIAGNOSIS — H52223 Regular astigmatism, bilateral: Secondary | ICD-10-CM | POA: Diagnosis not present

## 2017-01-04 DIAGNOSIS — H524 Presbyopia: Secondary | ICD-10-CM | POA: Diagnosis not present

## 2017-01-04 DIAGNOSIS — H35033 Hypertensive retinopathy, bilateral: Secondary | ICD-10-CM | POA: Diagnosis not present

## 2017-01-23 LAB — CUP PACEART REMOTE DEVICE CHECK
Date Time Interrogation Session: 20171209170927
Implantable Pulse Generator Implant Date: 20160816

## 2017-01-23 NOTE — Progress Notes (Signed)
[facility] summary report received. Battery status OK. Normal device function. No new symptom episodes, tachy episodes, brady, or pause episodes. No new AF episodes. Monthly summary reports and ROV/PRN 

## 2017-01-30 DIAGNOSIS — F329 Major depressive disorder, single episode, unspecified: Secondary | ICD-10-CM | POA: Diagnosis not present

## 2017-01-30 DIAGNOSIS — Z79899 Other long term (current) drug therapy: Secondary | ICD-10-CM | POA: Diagnosis not present

## 2017-01-30 DIAGNOSIS — F419 Anxiety disorder, unspecified: Secondary | ICD-10-CM | POA: Diagnosis not present

## 2017-01-30 DIAGNOSIS — D696 Thrombocytopenia, unspecified: Secondary | ICD-10-CM | POA: Diagnosis not present

## 2017-01-30 DIAGNOSIS — Z125 Encounter for screening for malignant neoplasm of prostate: Secondary | ICD-10-CM | POA: Diagnosis not present

## 2017-01-30 DIAGNOSIS — G43109 Migraine with aura, not intractable, without status migrainosus: Secondary | ICD-10-CM | POA: Diagnosis not present

## 2017-01-31 ENCOUNTER — Ambulatory Visit (INDEPENDENT_AMBULATORY_CARE_PROVIDER_SITE_OTHER): Payer: Medicare Other | Admitting: *Deleted

## 2017-01-31 DIAGNOSIS — I639 Cerebral infarction, unspecified: Secondary | ICD-10-CM

## 2017-01-31 NOTE — Progress Notes (Signed)
Carelink Summary Report / Loop Recorder 

## 2017-02-01 DIAGNOSIS — Z8551 Personal history of malignant neoplasm of bladder: Secondary | ICD-10-CM | POA: Diagnosis not present

## 2017-02-01 DIAGNOSIS — I480 Paroxysmal atrial fibrillation: Secondary | ICD-10-CM | POA: Diagnosis not present

## 2017-02-01 DIAGNOSIS — I712 Thoracic aortic aneurysm, without rupture: Secondary | ICD-10-CM | POA: Diagnosis not present

## 2017-02-01 DIAGNOSIS — Z6829 Body mass index (BMI) 29.0-29.9, adult: Secondary | ICD-10-CM | POA: Diagnosis not present

## 2017-02-13 ENCOUNTER — Encounter (HOSPITAL_COMMUNITY): Payer: Self-pay | Admitting: Physical Therapy

## 2017-02-13 ENCOUNTER — Ambulatory Visit (HOSPITAL_COMMUNITY): Payer: Medicare Other | Attending: Internal Medicine | Admitting: Physical Therapy

## 2017-02-13 DIAGNOSIS — G8929 Other chronic pain: Secondary | ICD-10-CM

## 2017-02-13 DIAGNOSIS — M545 Low back pain: Secondary | ICD-10-CM | POA: Insufficient documentation

## 2017-02-13 DIAGNOSIS — R2689 Other abnormalities of gait and mobility: Secondary | ICD-10-CM

## 2017-02-13 DIAGNOSIS — M6281 Muscle weakness (generalized): Secondary | ICD-10-CM

## 2017-02-13 DIAGNOSIS — R29898 Other symptoms and signs involving the musculoskeletal system: Secondary | ICD-10-CM

## 2017-02-13 DIAGNOSIS — M25552 Pain in left hip: Secondary | ICD-10-CM | POA: Diagnosis not present

## 2017-02-13 LAB — CUP PACEART REMOTE DEVICE CHECK
Date Time Interrogation Session: 20180108184710
Implantable Pulse Generator Implant Date: 20160816

## 2017-02-13 NOTE — Therapy (Signed)
[place], [place], [address] Phone: [phone]   Fax:  [phone]  Physical Therapy Evaluation  Patient Details  Name: Kyle Owen MRN: [id number] Date of Birth: [birth year] Referring Provider: Asencion Noble, MD  Encounter Date: 02/13/2017      PT End of Session - 02/13/17 1024    Visit Number 1   Number of Visits 9   Date for PT Re-Evaluation 03/13/17   Authorization Type Medicare A and B    Authorization Time Period 02/13/17 to 03/13/17   PT Start Time 0732   PT Stop Time 0815   PT Time Calculation (min) 43 min   Activity Tolerance Patient tolerated treatment well;No increased pain   Behavior During Therapy WFL for tasks assessed/performed      Past Medical History:  Diagnosis Date  . Arthritis    DJD right knee  . Benign prostatic hypertrophy with urinary frequency   . Bladder cancer ([place])   . COPD with emphysema ([place])   . Depression   . GERD (gastroesophageal reflux disease)   . History of colon polyps   . History of pneumonia    hx Recurrent -- last bout Dec 2016 CAP-- per pt resolved  . 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 Hyperlipidemia   . 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 Nephrolithiasis    right non-obstructive  per CT 05-02-2016  . Ocular migraine    controlled w/ verapamil  . PAF (paroxysmal atrial fibrillation) ([place])    followed by AFIB clinic-- cardiologist-- dr Marlou Porch  . Pulmonary nodule, left    left lower lobe x2 per CT 01-20-2016  . Renal cyst, left   . Thoracic aortic aneurysm without rupture ([place])    ascending -- ct chest 01/10/16  4.8cm  . Wears hearing aid    bilateral-- wear at  times    Past Surgical History:  Procedure Laterality Date  . CATARACT EXTRACTION W/ INTRAOCULAR LENS IMPLANT Right 2016  . CATARACT EXTRACTION W/PHACO  12/16/2012   Procedure: CATARACT EXTRACTION PHACO AND INTRAOCULAR LENS PLACEMENT (IOC);  Surgeon: Tonny Branch, MD;  [facility];  Service: Ophthalmology;  Laterality: Left;  CDE:17.31  . COLONOSCOPY  10/03/2011   Procedure: COLONOSCOPY;  Surgeon: Jamesetta So;  [facility];  Service: Gastroenterology;  Laterality: N/A;  . DECOMPRESSIOIN ULNAR NERVE AND CUBITAL TUNNEL RELEASE Left 07-14-2009   elbow  . 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 INGUINAL HERNIA REPAIR Right 1990's  . KNEE ARTHROSCOPY Right 2015  . LEFT CUBITAL TUNNEL RELEASE    . POSTERIOR LUMBAR FUSION  2014  . SHOULDER ARTHROSCOPY Left 1990's  . TEE WITHOUT CARDIOVERSION N/A 07/27/2015   Procedure: TRANSESOPHAGEAL ECHOCARDIOGRAM (TEE);  Surgeon: Lelon Perla, MD;  Location: [facility] ENDOSCOPY;  Service: Cardiovascular;  Laterality: N/A;   normal LV function, ef 55-60%,  mild AR and MR, mild dilated ascending aorta (4.2cm),  mild to moderate atherosclerosis  descending aorta,  mild to moderate TR,  negative saline microcavitation study  . THYROIDECTOMY Right 01/20/2015   Procedure: RIGHT THYROIDECTOMY;  Surgeon: Ascencion Dike, MD;  Location: [facility];  Service: ENT;  Laterality: Right;  . TONSILLECTOMY  as child  . TRANSURETHRAL RESECTION OF BLADDER TUMOR N/A 05/15/2016   Procedure: TRANSURETHRAL RESECTION OF BLADDER TUMOR (TURBT) AND INSTILLATION OF EPIRUBICIN;  Surgeon: Franchot Gallo, MD;  Location: [facility];  Service: Urology;  Laterality: N/A;    There were no vitals filed for this visit.       Subjective Assessment - 02/13/17 0739    Subjective Pt reports having low back surgery several years ago to improve the pain down his Lt leg. This resolved after the surgery. He has had therapy  in the past for his hip but after surgery he couldn't continue with his stretches, etc. Now because of the pain, he is starting to fall several times a year. He saw both a "back Dr." and a "hip Dr." which both feel it is one or the other causing his symptoms. He tries to go to the fitness center 2x/week and thinks this helps.   Pertinent History Arthritis, COPD, depression, GERD, thoracic aortic aneurysm, hx of TIA   How long can you sit comfortably? after driving for a couple of hours, he can't hardly walk for a few seconds   How long can you stand comfortably? 1.5   Patient Stated Goals improve pain and mobility   Currently in Pain? Yes   Pain Location Back   Pain Orientation Posterior   Pain Descriptors / Indicators Dull;Sharp  occasional sharp pain when walking which causes him to lose his balance    Pain Type Chronic pain   Aggravating Factors  sitting, carrying object ~5lb   Pain Relieving Factors laying flat, traction   Effect of Pain on Daily Activities increasing risk of falling, now has to use the [facility]            [facility] PT Assessment - 02/13/17 0001      Assessment   Medical Diagnosis LBP and hip pain   Referring Provider Asencion Noble, MD   Onset Date/Surgical Date --  atleast 1 year ago   Next MD Visit sometime in July    Prior Therapy OPPT several years ago which helped alot      Balance Screen   Has the patient fallen in the past 6 months Yes   How many times? 1  several close falls    Has the patient had a decrease in activity level because of a fear of falling?  Yes   Is the patient reluctant to leave their home because of a fear of falling?  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, enjoys fishing      Cognition   Overall Cognitive Status Within Functional Limits for tasks assessed     Observation/Other Assessments   Observations Slouched sitting, leaning to  the Rt      Sensation   Light Touch Appears Intact     Posture/Postural Control   Posture Comments sitting slouched to the Rt with decreased lordosis, forward head and rounded shoulders      ROM / Strength   AROM / PROM / Strength AROM;Strength     AROM   AROM Assessment Site Lumbar   Lumbar Flexion mid shin, pain free   Lumbar Extension to neutral, pain free     Strength   Strength Assessment Site Hip;Knee;Ankle   Right/Left Hip Right;Left   Right Hip Flexion  4/5   Right Hip Extension 4/5  largely limited due to ROM   Right Hip ABduction 3+/5   Left Hip Flexion 3+/5   Left Hip Extension 3+/5  largely limited due to ROM   Left Hip ABduction 4/5   Right/Left Knee Right;Left   Right Knee Extension 5/5   Left Knee Extension 5/5   Right/Left Ankle Right;Left   Right Ankle Dorsiflexion 5/5   Left Ankle Dorsiflexion 5/5     Flexibility   Soft Tissue Assessment /Muscle Length yes   Quadriceps limited BLE; 90deg flexion    Piriformis hip IR: 10 deg on the Lt      Palpation   Palpation comment Tender with palpation along his Lt glute max/piriformis      Transfers   Five time sit to stand comments  21 sec, occasional UE support      Ambulation/Gait   Gait Comments Pt ambulating into the clinic with SPC and decreased weight shift onto the Lt, decreased step length on the Rt      High Level Balance   High Level Balance Comments SLS: 10-15 sec on each LE                            PT Education - 02/13/17 1022    Education provided Yes   Education Details eval findings/POC; HEP initiated and likely updated next session when more time is available   Person(s) Educated Patient   Methods Explanation   Comprehension Verbalized understanding          PT Short Term Goals - 02/13/17 1305      PT SHORT TERM GOAL #1   Title Pt will demo consistency and independence with his HEP to improve strength, flexibility and pain.    Time 2   Period Weeks    Status New     PT SHORT TERM GOAL #2   Title Pt will demo proper set up and use of a lumbar roll to improve his seated posture and decrease pain.    Time 2   Period Weeks   Status New           PT Long Term Goals - 02/13/17 1306      PT LONG TERM GOAL #1   Title Pt will demo improved hip strength to atleast 4+/5 MMT, to increase his safety with functional tasks.   Time 4   Period Weeks   Status New     PT LONG TERM GOAL #2   Title Pt will perform SLS on each LE without LOB atleast 20 sec, 2/3 trials, to increase his steadiness with walking.    Time 4   Period Weeks   Status New     PT LONG TERM GOAL #3   Title Pt will complete 5x sit to stand in less than 13 sec without UE support, to demonstrate an improvement in his functional strength and power.    Time 4   Period Weeks   Status New     PT LONG TERM GOAL #4   Title Pt will demo improved Lt hip IR PROM, to within 5 deg of the RLE, to demonstrate an improvement in muscle length and flexibility.    Time 4   Period Weeks   Status New               Plan - 02/13/17 1252    Clinical Impression Statement Pt is a 76yo  M referred to OPPT with complaints of low back and Lt hip/buttock pain for atleast 1 year. He has received PT for this in the past with good outcomes, however he had spinal surgery and was unable to maintain his exercises and his pain has returned. He presents today with decreased hip strength, flexibility and tenderness with palpation along his glute/piriformis region. As a result, his balance and functional strength is impaired and placing him at an increased risk of falls and injury. He would benefit from skilled PT to address his limitations and improve his safety and mobility with daily activity. Eval findings and POC were discussed and HEP was initiated with more exercises to be provided at his next session secondary to limited time. Pt verbalized understanding and agreement at this time.    Rehab  Potential Good   PT Frequency 2x / week   PT Duration 4 weeks   PT Treatment/Interventions Cryotherapy;Moist Heat;Traction;Therapeutic activities;Functional mobility training;Stair training;Gait training;DME Instruction;Therapeutic exercise;Balance training;Neuromuscular re-education;Patient/family education;Manual techniques;Orthotic Fit/Training;Passive range of motion;Dry needling;Vestibular;Taping   PT Next Visit Plan HEP: hip extensor strength, quad stretch; focus on hip mobility and flexibility; lumbar roll while seated to improve posture and pain    PT Home Exercise Plan prone hip flexor stretch x5 min at a time   Recommended Other Services none    Consulted and Agree with Plan of Care Patient      Patient will benefit from skilled therapeutic intervention in order to improve the following deficits and impairments:  Abnormal gait, Decreased activity tolerance, Decreased strength, Impaired flexibility, Postural dysfunction, Pain, Improper body mechanics, Decreased range of motion, Decreased endurance, Decreased balance, Decreased mobility, Hypomobility, Increased muscle spasms  Visit Diagnosis: Pain in left hip - Plan: PT plan of care cert/re-cert  Muscle weakness (generalized) - Plan: PT plan of care cert/re-cert  Other symptoms and signs involving the musculoskeletal system - Plan: PT plan of care cert/re-cert  Chronic low back pain, unspecified back pain laterality, with sciatica presence unspecified - Plan: PT plan of care cert/re-cert  Other abnormalities of gait and mobility - Plan: PT plan of care cert/re-cert      G-Codes - [id number]    Functional Assessment Tool Used Other (comment)  FOTO   Functional Assessment Tool Used FOTO: 54% limited   Functional Limitation Mobility: Walking and moving around   Mobility: Walking and Moving Around Current Status [id number]) At least 40 percent but less than 60 percent impaired, limited or restricted   Mobility: Walking and Moving  Around Goal Status [id number]) At least 20 percent but less than 40 percent impaired, limited or restricted       Problem List Patient Active Problem List   Diagnosis Date Noted  . Thoracic ascending aortic aneurysm ([place]) 05/09/2016  . Thoracic aortic aneurysm without rupture ([place]) 01/17/2016  . Paroxysmal atrial fibrillation ([place]) 12/17/2015  . S/P partial thyroidectomy 01/20/2015   1:18 PM,02/13/17 Elly Modena PT, DPT [facility] Physical Therapy [place] [address] [place], [place], [address] Phone: [phone]   Fax:  [phone]  Name: Kyle Owen MRN: [id number] Date of Birth: 1941/07/24

## 2017-02-15 ENCOUNTER — Ambulatory Visit (HOSPITAL_COMMUNITY): Payer: Medicare Other | Admitting: Physical Therapy

## 2017-02-15 DIAGNOSIS — M545 Low back pain: Secondary | ICD-10-CM | POA: Diagnosis not present

## 2017-02-15 DIAGNOSIS — R29898 Other symptoms and signs involving the musculoskeletal system: Secondary | ICD-10-CM

## 2017-02-15 DIAGNOSIS — R2689 Other abnormalities of gait and mobility: Secondary | ICD-10-CM | POA: Diagnosis not present

## 2017-02-15 DIAGNOSIS — G8929 Other chronic pain: Secondary | ICD-10-CM

## 2017-02-15 DIAGNOSIS — M25552 Pain in left hip: Secondary | ICD-10-CM | POA: Diagnosis not present

## 2017-02-15 DIAGNOSIS — M6281 Muscle weakness (generalized): Secondary | ICD-10-CM

## 2017-02-15 NOTE — Patient Instructions (Signed)
Straight Leg Raise    Tighten stomach and slowly raise locked right leg _15___ inches from floor. Repeat _10___ times per set.  Do __2__ sessions per day.     Abduction: Side Leg Lift (Eccentric) - Side-Lying    Lie on side. Lift top leg slightly higher than shoulder level. Keep top leg straight with body, toes pointing forward. Slowly lower for 3-5 seconds. _10__ reps per set, __2_ sets per day    Extension    Lift leg up in the air and bring it back down. Repeat with other leg.Repeat __10__ times. Do __2__ sessions per day.

## 2017-02-15 NOTE — Therapy (Signed)
[place], [place], [address] Phone: [phone]   Fax:  [phone]  Physical Therapy Treatment  Patient Details  Name: Kyle Owen MRN: [id number] Date of Birth: 04-24-1941 Referring Provider: Asencion Noble, MD  Encounter Date: 02/15/2017      PT End of Session - 02/15/17 1635    Visit Number 2   Number of Visits 9   Date for PT Re-Evaluation 03/13/17   Authorization Type Medicare A and B    Authorization Time Period 02/13/17 to 03/13/17   PT Start Time 1505   PT Stop Time 1600   PT Time Calculation (min) 55 min   Activity Tolerance Patient tolerated treatment well;No increased pain   Behavior During Therapy WFL for tasks assessed/performed      Past Medical History:  Diagnosis Date  . Arthritis    DJD right knee  . Benign prostatic hypertrophy with urinary frequency   . Bladder cancer ([place])   . COPD with emphysema ([place])   . Depression   . GERD (gastroesophageal reflux disease)   . History of colon polyps   . History of pneumonia    hx Recurrent -- last bout Dec 2016 CAP-- per pt resolved  . 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 Hyperlipidemia   . 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 Nephrolithiasis    right non-obstructive  per CT 05-02-2016  . Ocular migraine    controlled w/ verapamil  . PAF (paroxysmal atrial fibrillation) ([place])    followed by AFIB clinic-- cardiologist-- dr Marlou Porch  . Pulmonary nodule, left    left lower lobe x2 per CT 01-20-2016  . Renal cyst, left   . Thoracic aortic aneurysm without rupture ([place])    ascending -- ct chest 01/10/16  4.8cm  . Wears hearing aid    bilateral-- wear at times     Past Surgical History:  Procedure Laterality Date  . CATARACT EXTRACTION W/ INTRAOCULAR LENS IMPLANT Right 2016  . CATARACT EXTRACTION W/PHACO  12/16/2012   Procedure: CATARACT EXTRACTION PHACO AND INTRAOCULAR LENS PLACEMENT (IOC);  Surgeon: Tonny Branch, MD;  Location: [facility];  Service: Ophthalmology;  Laterality: Left;  CDE:17.31  . COLONOSCOPY  10/03/2011   Procedure: COLONOSCOPY;  Surgeon: Jamesetta So;  [facility];  Service: Gastroenterology;  Laterality: N/A;  . DECOMPRESSIOIN ULNAR NERVE AND CUBITAL TUNNEL RELEASE Left 07-14-2009   elbow  . 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 INGUINAL HERNIA REPAIR Right 1990's  . KNEE ARTHROSCOPY Right 2015  . LEFT CUBITAL TUNNEL RELEASE    . POSTERIOR LUMBAR FUSION  2014  . SHOULDER ARTHROSCOPY Left 1990's  . TEE WITHOUT CARDIOVERSION N/A 07/27/2015   Procedure: TRANSESOPHAGEAL ECHOCARDIOGRAM (TEE);  Surgeon: Lelon Perla, MD;  Location: [facility];  Service: Cardiovascular;  Laterality: N/A;   normal LV function, ef 55-60%,  mild AR and MR, mild dilated ascending aorta (4.2cm),  mild to moderate atherosclerosis  descending aorta,  mild to moderate TR,  negative saline microcavitation study  . THYROIDECTOMY Right 01/20/2015   Procedure: RIGHT THYROIDECTOMY;  Surgeon: Ascencion Dike, MD;  Location: [place] [facility];  Service: ENT;  Laterality: Right;  . TONSILLECTOMY  as child  . TRANSURETHRAL RESECTION OF BLADDER TUMOR N/A 05/15/2016   Procedure: TRANSURETHRAL RESECTION OF BLADDER TUMOR (TURBT) AND INSTILLATION OF EPIRUBICIN;  Surgeon: Franchot Gallo, MD;  Location: [facility];  Service: Urology;  Laterality: N/A;    There were no vitals filed for this visit.      Subjective Assessment - 02/15/17 1529    Subjective Pt states he lost his SPC and is going to get another one.  Comes today without AD and states he has arthritis all over.  Pain in Lt hip and Rt knee  and noted antalgic gait.   Currently in Pain? Yes   Pain Score 3    Pain Location Hip   Pain Orientation Left                         [facility] Adult PT Treatment/Exercise - 02/15/17 0001      Lumbar Exercises: Seated   Other Seated Lumbar Exercises piriformis stretch seated 2X30" each  also reveiwed in supine     Lumbar Exercises: Supine   Straight Leg Raise 10 reps     Lumbar Exercises: Sidelying   Hip Abduction 10 reps     Lumbar Exercises: Prone   Straight Leg Raise 10 reps                PT Education - 02/15/17 1701    Education provided Yes   Education Details reviewed evaluation/goals/stretch given for HEP.  Updated HEP to include hip strengthening/piriformis stretch.  Educated on posture and use of lumbar roll.  Discussed equipment used at the [facility] and injury prevention.   Person(s) Educated Patient   Methods Explanation;Demonstration;Tactile cues;Verbal cues;Handout   Comprehension Verbalized understanding;Returned demonstration;Verbal cues required;Tactile cues required;Need further instruction          PT Short Term Goals - 02/13/17 1305      PT SHORT TERM GOAL #1   Title Pt will demo consistency and independence with his HEP to improve strength, flexibility and pain.    Time 2   Period Weeks   Status New     PT SHORT TERM GOAL #2   Title Pt will demo proper set up and use of a lumbar roll to improve his seated posture and decrease pain.    Time 2   Period Weeks   Status New           PT Long Term Goals - 02/13/17 1306      PT LONG TERM GOAL #1   Title Pt will demo improved hip strength to atleast 4+/5 MMT, to increase his safety with functional tasks.   Time 4   Period Weeks   Status New     PT LONG TERM GOAL #2   Title Pt will perform SLS on each LE without LOB atleast 20 sec, 2/3 trials, to increase his steadiness with walking.    Time 4   Period Weeks   Status New     PT LONG TERM GOAL #3   Title Pt will complete  5x sit to stand in less than 13 sec without UE support, to demonstrate an improvement in his functional strength and power.    Time 4   Period Weeks   Status New     PT LONG TERM GOAL #4   Title Pt will demo improved Lt hip IR PROM, to within 5 deg of the RLE, to demonstrate an  improvement in muscle length and flexibility.    Time 4   Period Weeks   Status New               Plan - 02/15/17 1635    Clinical Impression Statement Pt admits to being very forgetful.  states he lost his SPC at his last visit and has not been able to located it.  States he also lost the instruction sheet given to him at his first appointment.  Reveiwed exercise given and goals from initial evaluation.  Progressed with hip strengthening exercises and given written illustrations to complete at home.  Pt able to complete with cues for form.  Noted weakness in quad musculature with SLR.  ENcouraged pateint to obtain another [facility] asap due to noted antalgia with gait.     Rehab Potential Good   PT Frequency 2x / week   PT Duration 4 weeks   PT Treatment/Interventions Cryotherapy;Moist Heat;Traction;Therapeutic activities;Functional mobility training;Stair training;Gait training;DME Instruction;Therapeutic exercise;Balance training;Neuromuscular re-education;Patient/family education;Manual techniques;Orthotic Fit/Training;Passive range of motion;Dry needling;Vestibular;Taping   PT Next Visit Plan Next session begin hamstring and quad stretch.  Focu on hip mobility and flexibility.  Stress importance of posture and use of lumbar roll while seated to improve posture and pain    PT Home Exercise Plan prone hip flexor stretch x5 min at a time 2/22:  prone hip extension, sidelying hip abduction, supine SLR    Consulted and Agree with Plan of Care Patient      Patient will benefit from skilled therapeutic intervention in order to improve the following deficits and impairments:  Abnormal gait, Decreased activity tolerance,  Decreased strength, Impaired flexibility, Postural dysfunction, Pain, Improper body mechanics, Decreased range of motion, Decreased endurance, Decreased balance, Decreased mobility, Hypomobility, Increased muscle spasms  Visit Diagnosis: Pain in left hip  Muscle weakness (generalized)  Other symptoms and signs involving the musculoskeletal system  Chronic low back pain, unspecified back pain laterality, with sciatica presence unspecified  Other abnormalities of gait and mobility     Problem List Patient Active Problem List   Diagnosis Date Noted  . Thoracic ascending aortic aneurysm ([place]) 05/09/2016  . Thoracic aortic aneurysm without rupture ([place]) 01/17/2016  . Paroxysmal atrial fibrillation ([place]) 12/17/2015  . S/P partial thyroidectomy 01/20/2015    Teena Irani, PTA/CLT [phone]  02/15/2017, 5:07 PM  [place] [address] [place], [place], [address] Phone: [phone]   Fax:  [phone]  Name: Kyle Owen MRN: [id number] Date of Birth: 07/14/1941

## 2017-02-20 ENCOUNTER — Ambulatory Visit (HOSPITAL_COMMUNITY): Payer: Medicare Other | Admitting: Physical Therapy

## 2017-02-20 DIAGNOSIS — R2689 Other abnormalities of gait and mobility: Secondary | ICD-10-CM

## 2017-02-20 DIAGNOSIS — M545 Low back pain: Secondary | ICD-10-CM | POA: Diagnosis not present

## 2017-02-20 DIAGNOSIS — M6281 Muscle weakness (generalized): Secondary | ICD-10-CM

## 2017-02-20 DIAGNOSIS — M25552 Pain in left hip: Secondary | ICD-10-CM | POA: Diagnosis not present

## 2017-02-20 DIAGNOSIS — R29898 Other symptoms and signs involving the musculoskeletal system: Secondary | ICD-10-CM | POA: Diagnosis not present

## 2017-02-20 DIAGNOSIS — G8929 Other chronic pain: Secondary | ICD-10-CM | POA: Diagnosis not present

## 2017-02-20 NOTE — Therapy (Signed)
[place], [place], [address] Phone: [phone]   Fax:  [phone]  Physical Therapy Treatment  Patient Details  Name: Kyle Owen MRN: [id number] Date of Birth: December 23, 1941 Referring Provider: Asencion Noble, MD  Encounter Date: 02/20/2017      PT End of Session - 02/20/17 1205    Visit Number 3   Number of Visits 9   Date for PT Re-Evaluation 03/13/17   Authorization Type Medicare A and B    Authorization Time Period 02/13/17 to 03/13/17   Authorization - Visit Number 3   Authorization - Number of Visits 10   PT Start Time [id number]   PT Stop Time [id number]   PT Time Calculation (min) 51 min   Activity Tolerance Patient tolerated treatment well;No increased pain   Behavior During Therapy WFL for tasks assessed/performed      Past Medical History:  Diagnosis Date  . Arthritis    DJD right knee  . Benign prostatic hypertrophy with urinary frequency   . Bladder cancer ([place])   . COPD with emphysema ([place])   . Depression   . GERD (gastroesophageal reflux disease)   . History of colon polyps   . History of pneumonia    hx Recurrent -- last bout Dec 2016 CAP-- per pt resolved  . 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 Hyperlipidemia   . 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 Nephrolithiasis    right non-obstructive  per CT 05-02-2016  . Ocular migraine    controlled w/ verapamil  . PAF (paroxysmal atrial fibrillation) ([place])    followed by AFIB clinic-- cardiologist-- dr Marlou Porch  . Pulmonary nodule, left    left lower lobe x2 per CT 01-20-2016  . Renal cyst, left   . Thoracic aortic aneurysm without rupture ([place])    ascending -- ct  chest 01/10/16  4.8cm  . Wears hearing aid    bilateral-- wear at times    Past Surgical History:  Procedure Laterality Date  . CATARACT EXTRACTION W/ INTRAOCULAR LENS IMPLANT Right 2016  . CATARACT EXTRACTION W/PHACO  12/16/2012   Procedure: CATARACT EXTRACTION PHACO AND INTRAOCULAR LENS PLACEMENT (IOC);  Surgeon: Tonny Branch, MD;  [facility];  Service: Ophthalmology;  Laterality: Left;  CDE:17.31  . COLONOSCOPY  10/03/2011   Procedure: COLONOSCOPY;  Surgeon: Jamesetta So;  [facility];  Service: Gastroenterology;  Laterality: N/A;  . DECOMPRESSIOIN ULNAR NERVE AND CUBITAL TUNNEL RELEASE Left 07-14-2009   elbow  . 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 INGUINAL HERNIA REPAIR Right 1990's  . KNEE ARTHROSCOPY Right 2015  . LEFT CUBITAL TUNNEL RELEASE    . POSTERIOR LUMBAR FUSION  2014  . SHOULDER ARTHROSCOPY Left 1990's  . TEE WITHOUT CARDIOVERSION N/A 07/27/2015   Procedure: TRANSESOPHAGEAL ECHOCARDIOGRAM (TEE);  Surgeon: Lelon Perla, MD;  Location: [facility];  Service: Cardiovascular;  Laterality: N/A;   normal LV function, ef 55-60%,  mild AR and MR, mild dilated ascending aorta (4.2cm),  mild to moderate atherosclerosis  descending aorta,  mild to moderate TR,  negative saline microcavitation study  . THYROIDECTOMY Right 01/20/2015   Procedure: RIGHT  THYROIDECTOMY;  Surgeon: Ascencion Dike, MD;  Location: [facility];  Service: ENT;  Laterality: Right;  . TONSILLECTOMY  as child  . TRANSURETHRAL RESECTION OF BLADDER TUMOR N/A 05/15/2016   Procedure: TRANSURETHRAL RESECTION OF BLADDER TUMOR (TURBT) AND INSTILLATION OF EPIRUBICIN;  Surgeon: Franchot Gallo, MD;  Location: [facility];  Service: Urology;  Laterality: N/A;    There were no vitals filed for this visit.      Subjective Assessment - 02/20/17 1012    Subjective Pt comes back today using a SPC that he purchased.  states he made a lumbar  roll out of a pool noodle with PVC pipe inside.   Currently in Pain? Yes   Pain Score 3    Pain Location Hip   Pain Orientation Left   Pain Descriptors / Indicators Sharp;Shooting   Pain Radiating Towards Lt hip                         [facility] Adult PT Treatment/Exercise - 02/20/17 0001      Lumbar Exercises: Standing   Other Standing Lumbar Exercises gait without AD and without antalgia   Other Standing Lumbar Exercises hamstring stretch 12" 3X30"     Lumbar Exercises: Seated   Sit to Stand 5 reps   Sit to Stand Limitations no UE's with cues to equalize WB   Other Seated Lumbar Exercises piriformis stretch seated 2X30" each     Lumbar Exercises: Sidelying   Hip Abduction 10 reps   Hip Abduction Limitations 2 sets     Lumbar Exercises: Prone   Straight Leg Raise 10 reps   Straight Leg Raises Limitations 2 sets                  PT Short Term Goals - 02/13/17 1305      PT SHORT TERM GOAL #1   Title Pt will demo consistency and independence with his HEP to improve strength, flexibility and pain.    Time 2   Period Weeks   Status New     PT SHORT TERM GOAL #2   Title Pt will demo proper set up and use of a lumbar roll to improve his seated posture and decrease pain.    Time 2   Period Weeks   Status New           PT Long Term Goals - 02/13/17 1306      PT LONG TERM GOAL #1   Title Pt will demo improved hip strength to atleast 4+/5 MMT, to increase his safety with functional tasks.   Time 4   Period Weeks   Status New     PT LONG TERM GOAL #2   Title Pt will perform SLS on each LE without LOB atleast 20 sec, 2/3 trials, to increase his steadiness with walking.    Time 4   Period Weeks   Status New     PT LONG TERM GOAL #3   Title Pt will complete 5x sit to stand in less than 13 sec without UE support, to demonstrate an improvement in his functional strength and power.    Time 4   Period Weeks   Status New     PT LONG TERM GOAL  #4   Title Pt will demo improved Lt hip IR PROM, to within 5 deg of the RLE, to demonstrate an improvement in muscle length and flexibility.    Time 4   Period  Weeks   Status New               Plan - 02/20/17 1206    Clinical Impression Statement Continued with therex and eduction.  Able to increase his sets this visit with most challenge being hip extension.  Added hamstring stretch with noted tightness.   Worked on gait without AD and without antalgia.  pt able to complete in good form when decreases gait speed.  Antalgia is more of a habbit at this point versus painful.  No change in pain at end of session.    Rehab Potential Good   PT Frequency 2x / week   PT Duration 4 weeks   PT Treatment/Interventions Cryotherapy;Moist Heat;Traction;Therapeutic activities;Functional mobility training;Stair training;Gait training;DME Instruction;Therapeutic exercise;Balance training;Neuromuscular re-education;Patient/family education;Manual techniques;Orthotic Fit/Training;Passive range of motion;Dry needling;Vestibular;Taping   PT Next Visit Plan Focus on hip mobility and flexibility.  Stress importance of posture and use of lumbar roll while seated to improve posture and pain    PT Home Exercise Plan prone hip flexor stretch x5 min at a time 2/22:  prone hip extension, sidelying hip abduction, supine SLR    Consulted and Agree with Plan of Care Patient      Patient will benefit from skilled therapeutic intervention in order to improve the following deficits and impairments:  Abnormal gait, Decreased activity tolerance, Decreased strength, Impaired flexibility, Postural dysfunction, Pain, Improper body mechanics, Decreased range of motion, Decreased endurance, Decreased balance, Decreased mobility, Hypomobility, Increased muscle spasms  Visit Diagnosis: Pain in left hip  Muscle weakness (generalized)  Other symptoms and signs involving the musculoskeletal system  Chronic low back pain,  unspecified back pain laterality, with sciatica presence unspecified  Other abnormalities of gait and mobility     Problem List Patient Active Problem List   Diagnosis Date Noted  . Thoracic ascending aortic aneurysm ([place]) 05/09/2016  . Thoracic aortic aneurysm without rupture ([place]) 01/17/2016  . Paroxysmal atrial fibrillation ([place]) 12/17/2015  . S/P partial thyroidectomy 01/20/2015    Teena Irani, PTA/CLT [phone]  02/20/2017, 12:09 PM  [place] [address] [place], [place], [address] Phone: [phone]   Fax:  [phone]  Name: KANISHK WILBERT MRN: [id number] Date of Birth: 10/11/1941

## 2017-02-22 ENCOUNTER — Ambulatory Visit (HOSPITAL_COMMUNITY): Payer: Medicare Other | Attending: Internal Medicine

## 2017-02-22 DIAGNOSIS — R29898 Other symptoms and signs involving the musculoskeletal system: Secondary | ICD-10-CM | POA: Diagnosis not present

## 2017-02-22 DIAGNOSIS — M6281 Muscle weakness (generalized): Secondary | ICD-10-CM | POA: Diagnosis not present

## 2017-02-22 DIAGNOSIS — R2689 Other abnormalities of gait and mobility: Secondary | ICD-10-CM

## 2017-02-22 DIAGNOSIS — M25552 Pain in left hip: Secondary | ICD-10-CM | POA: Diagnosis not present

## 2017-02-22 DIAGNOSIS — G8929 Other chronic pain: Secondary | ICD-10-CM

## 2017-02-22 DIAGNOSIS — M545 Low back pain: Secondary | ICD-10-CM | POA: Diagnosis not present

## 2017-02-22 NOTE — Therapy (Signed)
[place], [place], [address] Phone: [phone]   Fax:  [phone]  Physical Therapy Treatment  Patient Details  Name: Kyle Owen MRN: [id number] Date of Birth: 1941-09-12 Referring Provider: Asencion Noble, MD  Encounter Date: 02/22/2017      PT End of Session - 02/22/17 1453    Visit Number 4   Number of Visits 9   Date for PT Re-Evaluation 03/13/17   Authorization Type Medicare A and B    Authorization Time Period 02/13/17 to 03/13/17   Authorization - Visit Number 4   Authorization - Number of Visits 10   PT Start Time [id number]   PT Stop Time [id number]   PT Time Calculation (min) 46 min   Activity Tolerance Patient tolerated treatment well;No increased pain   Behavior During Therapy WFL for tasks assessed/performed      Past Medical History:  Diagnosis Date  . Arthritis    DJD right knee  . Benign prostatic hypertrophy with urinary frequency   . Bladder cancer ([place])   . COPD with emphysema ([place])   . Depression   . GERD (gastroesophageal reflux disease)   . History of colon polyps   . History of pneumonia    hx Recurrent -- last bout Dec 2016 CAP-- per pt resolved  . 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 Hyperlipidemia   . 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 Nephrolithiasis    right non-obstructive  per CT 05-02-2016  . Ocular migraine    controlled w/ verapamil  . PAF (paroxysmal atrial fibrillation) ([place])    followed by AFIB clinic-- cardiologist-- dr Marlou Porch  . Pulmonary nodule, left    left lower lobe x2 per CT 01-20-2016  . Renal cyst, left   . Thoracic aortic aneurysm without rupture ([place])    ascending -- ct  chest 01/10/16  4.8cm  . Wears hearing aid    bilateral-- wear at times    Past Surgical History:  Procedure Laterality Date  . CATARACT EXTRACTION W/ INTRAOCULAR LENS IMPLANT Right 2016  . CATARACT EXTRACTION W/PHACO  12/16/2012   Procedure: CATARACT EXTRACTION PHACO AND INTRAOCULAR LENS PLACEMENT (IOC);  Surgeon: Tonny Branch, MD;  Location: [facility];  Service: Ophthalmology;  Laterality: Left;  CDE:17.31  . COLONOSCOPY  10/03/2011   Procedure: COLONOSCOPY;  Surgeon: Jamesetta So;  [facility];  Service: Gastroenterology;  Laterality: N/A;  . DECOMPRESSIOIN ULNAR NERVE AND CUBITAL TUNNEL RELEASE Left 07-14-2009   elbow  . 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 INGUINAL HERNIA REPAIR Right 1990's  . KNEE ARTHROSCOPY Right 2015  . LEFT CUBITAL TUNNEL RELEASE    . POSTERIOR LUMBAR FUSION  2014  . SHOULDER ARTHROSCOPY Left 1990's  . TEE WITHOUT CARDIOVERSION N/A 07/27/2015   Procedure: TRANSESOPHAGEAL ECHOCARDIOGRAM (TEE);  Surgeon: Lelon Perla, MD;  Location: [facility] END[facility]COPY;  Service: Cardiovascular;  Laterality: N/A;   normal LV function, ef 55-60%,  mild AR and MR, mild dilated ascending aorta (4.2cm),  mild to moderate atherosclerosis  descending aorta,  mild to moderate TR,  negative saline microcavitation study  . THYROIDECTOMY Right 01/20/2015   Procedure: RIGHT  THYROIDECTOMY;  Surgeon: Ascencion Dike, MD;  Location: [place];  Service: ENT;  Laterality: Right;  . TONSILLECTOMY  as child  . TRANSURETHRAL RESECTION OF BLADDER TUMOR N/A 05/15/2016   Procedure: TRANSURETHRAL RESECTION OF BLADDER TUMOR (TURBT) AND INSTILLATION OF EPIRUBICIN;  Surgeon: Franchot Gallo, MD;  Location: [facility];  Service: Urology;  Laterality: N/A;    There were no vitals filed for this visit.      Subjective Assessment - 02/22/17 1429    Subjective Pt c/o legs cramps last night and reports of increased pain posterior Lt  knee.  Reports complaince with HEP daily as well as [facility].     Pertinent History Arthritis, COPD, depression, GERD, thoracic aortic aneurysm, hx of TIA   Patient Stated Goals improve pain and mobility   Currently in Pain? Yes   Pain Score 3    Pain Location Knee   Pain Orientation Left   Pain Descriptors / Indicators Dull;Throbbing   Pain Type Chronic pain   Pain Frequency Constant   Aggravating Factors  sitting, carrying object ~5 lb, increased standing   Pain Relieving Factors laying flat, traction   Effect of Pain on Daily Activities increased risk of falling, now has to use the [facility].                         [facility] Adult PT Treatment/Exercise - 02/22/17 0001      Lumbar Exercises: Standing   Functional Squats 10 reps   Functional Squats Limitations 3D hip excursion; mat behind to improve form with squats     Lumbar Exercises: Seated   Sit to Stand 10 reps   Sit to Stand Limitations no UE's with cues to equalize WB and controlled descent     Lumbar Exercises: Sidelying   Hip Abduction 15 reps     Lumbar Exercises: Prone   Straight Leg Raise 10 reps   Straight Leg Raises Limitations 2 sets     Knee/Hip Exercises: Stretches   Active Hamstring Stretch 3 reps;30 seconds   Active Hamstring Stretch Limitations supine wiht rope   Quad Stretch 3 reps;30 seconds   Quad Stretch Limitations prone with rope                PT Education - 02/22/17 1522    Education provided Yes   Education Details Educated importance of proper posture for pain control   Person(s) Educated Patient   Methods Explanation;Demonstration;Tactile cues;Verbal cues   Comprehension Verbalized understanding;Returned demonstration;Need further instruction          PT Short Term Goals - 02/13/17 1305      PT SHORT TERM GOAL #1   Title Pt will demo consistency and independence with his HEP to improve strength, flexibility and pain.    Time 2    Period Weeks   Status New     PT SHORT TERM GOAL #2   Title Pt will demo proper set up and use of a lumbar roll to improve his seated posture and decrease pain.    Time 2   Period Weeks   Status New           PT Long Term Goals - 02/13/17 1306      PT LONG TERM GOAL #1   Title Pt will demo improved hip strength to atleast 4+/5 MMT, to increase his safety with functional tasks.   Time 4   Period  Weeks   Status New     PT LONG TERM GOAL #2   Title Pt will perform SLS on each LE without LOB atleast 20 sec, 2/3 trials, to increase his steadiness with walking.    Time 4   Period Weeks   Status New     PT LONG TERM GOAL #3   Title Pt will complete 5x sit to stand in less than 13 sec without UE support, to demonstrate an improvement in his functional strength and power.    Time 4   Period Weeks   Status New     PT LONG TERM GOAL #4   Title Pt will demo improved Lt hip IR PROM, to within 5 deg of the RLE, to demonstrate an improvement in muscle length and flexibility.    Time 4   Period Weeks   Status New               Plan - 02/22/17 1458    Clinical Impression Statement Pt reoprts complaince iwht HEP daily and stated he has been using lumbar roll while sitting majority of time.  Session focus on improving hip mobility and proximal strengthening to imrpove gait mechanics.  Added 3D hip excursion to improve hip mobilty all planes with therapist facilitaiotn for proper form and technique with new activity.  Included quad and hamstring stretches to address tightness with reports of frequent Rt hamstring cramping through session.  Decreased reports of cramping following stretches and mobility exercises.   Rehab Potential Good   PT Frequency 2x / week   PT Duration 4 weeks   PT Treatment/Interventions Cryotherapy;Moist Heat;Traction;Therapeutic activities;Functional mobility training;Stair training;Gait training;DME Instruction;Therapeutic exercise;Balance  training;Neuromuscular re-education;Patient/family education;Manual techniques;Orthotic Fit/Training;Passive range of motion;Dry needling;Vestibular;Taping   PT Next Visit Plan Assure correct form/mechanics with 3D hip excursion, may give as additional HEP for hip mobiltiy if good form demonstrated.  Focus on hip mobility and flexibility.  Stress importance of posture and use of lumbar roll while seated to improve posture and pain    PT Home Exercise Plan prone hip flexor stretch x5 min at a time 2/22:  prone hip extension, sidelying hip abduction, supine SLR       Patient will benefit from skilled therapeutic intervention in order to improve the following deficits and impairments:  Abnormal gait, Decreased activity tolerance, Decreased strength, Impaired flexibility, Postural dysfunction, Pain, Improper body mechanics, Decreased range of motion, Decreased endurance, Decreased balance, Decreased mobility, Hypomobility, Increased muscle spasms  Visit Diagnosis: Pain in left hip  Muscle weakness (generalized)  Other symptoms and signs involving the musculoskeletal system  Chronic low back pain, unspecified back pain laterality, with sciatica presence unspecified  Other abnormalities of gait and mobility     Problem List Patient Active Problem List   Diagnosis Date Noted  . Thoracic ascending aortic aneurysm ([place]) 05/09/2016  . Thoracic aortic aneurysm without rupture ([place]) 01/17/2016  . Paroxysmal atrial fibrillation ([place]) 12/17/2015  . S/P partial thyroidectomy 01/20/2015   Ihor Austin, [place]; [place]  Aldona Lento 02/22/2017, 4:20 PM  [facility] [address] [place], [place], [address] Phone: [phone]   Fax:  [phone]  Name: Kyle Owen MRN: [id number] Date of Birth: Apr 01, 1941

## 2017-02-25 LAB — CUP PACEART REMOTE DEVICE CHECK
Date Time Interrogation Session: 20180207173955
Implantable Pulse Generator Implant Date: 20160816

## 2017-02-25 NOTE — Progress Notes (Signed)
[facility] summary report received. Battery status OK. Normal device function. No new symptom episodes, tachy episodes, brady, or pause episodes. No new AF episodes. Monthly summary reports and ROV/PRN 

## 2017-02-27 ENCOUNTER — Ambulatory Visit (HOSPITAL_COMMUNITY): Payer: Medicare Other

## 2017-02-27 DIAGNOSIS — R2689 Other abnormalities of gait and mobility: Secondary | ICD-10-CM | POA: Diagnosis not present

## 2017-02-27 DIAGNOSIS — M545 Low back pain: Secondary | ICD-10-CM | POA: Diagnosis not present

## 2017-02-27 DIAGNOSIS — R29898 Other symptoms and signs involving the musculoskeletal system: Secondary | ICD-10-CM

## 2017-02-27 DIAGNOSIS — M25552 Pain in left hip: Secondary | ICD-10-CM | POA: Diagnosis not present

## 2017-02-27 DIAGNOSIS — G8929 Other chronic pain: Secondary | ICD-10-CM

## 2017-02-27 DIAGNOSIS — M6281 Muscle weakness (generalized): Secondary | ICD-10-CM | POA: Diagnosis not present

## 2017-02-27 NOTE — Therapy (Signed)
[place], [place], [address] Phone: [phone]   Fax:  [phone]  Physical Therapy Treatment  Patient Details  Name: Kyle Owen MRN: [id number] Date of Birth: [birth year] Referring Provider: Asencion Noble, MD  Encounter Date: 02/27/2017      PT End of Session - 02/27/17 1126    Visit Number 5   Number of Visits 9   Date for PT Re-Evaluation 03/13/17   Authorization Type Medicare A and B    Authorization Time Period 02/13/17 to 03/13/17   Authorization - Visit Number 5   Authorization - Number of Visits 10   PT Start Time [id number]   PT Stop Time 1200   PT Time Calculation (min) 41 min   Activity Tolerance Patient tolerated treatment well;No increased pain   Behavior During Therapy WFL for tasks assessed/performed      Past Medical History:  Diagnosis Date  . Arthritis    DJD right knee  . Benign prostatic hypertrophy with urinary frequency   . Bladder cancer ([place])   . COPD with emphysema ([place])   . Depression   . GERD (gastroesophageal reflux disease)   . History of colon polyps   . History of pneumonia    hx Recurrent -- last bout Dec 2016 CAP-- per pt resolved  . 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 Hyperlipidemia   . 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 Nephrolithiasis    right non-obstructive  per CT 05-02-2016  . Ocular migraine    controlled w/ verapamil  . PAF (paroxysmal atrial fibrillation) ([place])    followed by AFIB clinic-- cardiologist-- dr Marlou Porch  . Pulmonary nodule, left    left lower lobe x2 per CT 01-20-2016  . Renal cyst, left   . Thoracic aortic aneurysm without rupture ([place])    ascending -- ct  chest 01/10/16  4.8cm  . Wears hearing aid    bilateral-- wear at times    Past Surgical History:  Procedure Laterality Date  . CATARACT EXTRACTION W/ INTRAOCULAR LENS IMPLANT Right 2016  . CATARACT EXTRACTION W/PHACO  12/16/2012   Procedure: CATARACT EXTRACTION PHACO AND INTRAOCULAR LENS PLACEMENT (IOC);  Surgeon: Tonny Branch, MD;  Location: [facility];  Service: Ophthalmology;  Laterality: Left;  CDE:17.31  . COLONOSCOPY  10/03/2011   Procedure: COLONOSCOPY;  Surgeon: Jamesetta So;  [facility];  Service: Gastroenterology;  Laterality: N/A;  . DECOMPRESSIOIN ULNAR NERVE AND CUBITAL TUNNEL RELEASE Left 07-14-2009   elbow  . 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 INGUINAL HERNIA REPAIR Right 1990's  . KNEE ARTHROSCOPY Right 2015  . LEFT CUBITAL TUNNEL RELEASE    . POSTERIOR LUMBAR FUSION  2014  . SHOULDER ARTHROSCOPY Left 1990's  . TEE WITHOUT CARDIOVERSION N/A 07/27/2015   Procedure: TRANSESOPHAGEAL ECHOCARDIOGRAM (TEE);  Surgeon: Lelon Perla, MD;  Location: [facility] ENDOSCOPY;  Service: Cardiovascular;  Laterality: N/A;   normal LV function, ef 55-60%,  mild AR and MR, mild dilated ascending aorta (4.2cm),  mild to moderate atherosclerosis  descending aorta,  mild to moderate TR,  negative saline microcavitation study  . THYROIDECTOMY Right 01/20/2015   Procedure: RIGHT  THYROIDECTOMY;  Surgeon: Ascencion Dike, MD;  Location: [place];  Service: ENT;  Laterality: Right;  . TONSILLECTOMY  as child  . TRANSURETHRAL RESECTION OF BLADDER TUMOR N/A 05/15/2016   Procedure: TRANSURETHRAL RESECTION OF BLADDER TUMOR (TURBT) AND INSTILLATION OF EPIRUBICIN;  Surgeon: Franchot Gallo, MD;  Location: [facility];  Service: Urology;  Laterality: N/A;    There were no vitals filed for this visit.      Subjective Assessment - 02/27/17 1122    Subjective Pt stated he has pain in lower back pain 3/10.  Pt c/o sinus problems, on day  2 with antibiotics.   Pertinent History Arthritis, COPD, depression, GERD, thoracic aortic aneurysm, hx of TIA   How long can you sit comfortably? after driving for a couple of hours, he can't hardly walk for a few seconds   Patient Stated Goals improve pain and mobility   Currently in Pain? Yes   Pain Score 3    Pain Location Back   Pain Orientation Lower   Pain Descriptors / Indicators Dull;Throbbing   Pain Type Chronic pain   Pain Frequency Constant   Aggravating Factors  sitting, carrying object ~5 lb, increased standing   Pain Relieving Factors laying flat, traction   Effect of Pain on Daily Activities increased risk of falling, now has to use the [facility] [place].                           [place] Adult PT Treatment/Exercise - 02/27/17 0001      Lumbar Exercises: Standing   Functional Squats 10 reps   Functional Squats Limitations 3D hip excursion; mat behind to improve form with squats     Lumbar Exercises: Supine   Ab Set 10 reps   AB Set Limitations tactile cueing     Lumbar Exercises: Sidelying   Hip Abduction 15 reps     Lumbar Exercises: Prone   Straight Leg Raise 15 reps   Straight Leg Raises Limitations 2 sets     Knee/Hip Exercises: Stretches   Active Hamstring Stretch 3 reps;30 seconds   Active Hamstring Stretch Limitations supine wiht rope   Quad Stretch 3 reps;30 seconds   Quad Stretch Limitations prone with rope   Piriformis Stretch 3 reps;30 seconds   Piriformis Stretch Limitations seated                  PT Short Term Goals - 02/13/17 1305      PT SHORT TERM GOAL #1   Title Pt will demo consistency and independence with his HEP to improve strength, flexibility and pain.    Time 2   Period Weeks   Status New     PT SHORT TERM GOAL #2   Title Pt will demo proper set up and use of a lumbar roll to improve his seated posture and decrease pain.    Time 2   Period Weeks   Status New           PT Long Term Goals - 02/13/17 1306       PT LONG TERM GOAL #1   Title Pt will demo improved hip strength to atleast 4+/5 MMT, to increase his safety with functional tasks.   Time 4   Period Weeks   Status New     PT LONG TERM GOAL #2   Title Pt will perform SLS on each LE without LOB atleast 20 sec, 2/3 trials, to increase his  steadiness with walking.    Time 4   Period Weeks   Status New     PT LONG TERM GOAL #3   Title Pt will complete 5x sit to stand in less than 13 sec without UE support, to demonstrate an improvement in his functional strength and power.    Time 4   Period Weeks   Status New     PT LONG TERM GOAL #4   Title Pt will demo improved Lt hip IR PROM, to within 5 deg of the RLE, to demonstrate an improvement in muscle length and flexibility.    Time 4   Period Weeks   Status New               Plan - 02/27/17 1215    Clinical Impression Statement Continued session focus on improving proximal strengthening and hip mobility.  Added core strengthening as support for lower back.  Resumed 3D hip excursion to improve hip mobility, verbal and visual cueing required to equalize stance phase wiht standing, tendency to stand with less weight bearing on Rt LE.  Reports of decreased overall cramping noted with stretches.  EOS no reports of pain.   Rehab Potential Good   PT Frequency 2x / week   PT Duration 4 weeks   PT Treatment/Interventions Cryotherapy;Moist Heat;Traction;Therapeutic activities;Functional mobility training;Stair training;Gait training;DME Instruction;Therapeutic exercise;Balance training;Neuromuscular re-education;Patient/family education;Manual techniques;Orthotic Fit/Training;Passive range of motion;Dry needling;Vestibular;Taping   PT Next Visit Plan Continue focus on hip mobility and flexibility.  Add static balance activities next session.  Stress importance of posture and use of lumbar roll while seated to improve posture and pain    PT Home Exercise Plan prone hip flexor stretch x5  min at a time 2/22:  prone hip extension, sidelying hip abduction, supine SLR       Patient will benefit from skilled therapeutic intervention in order to improve the following deficits and impairments:  Abnormal gait, Decreased activity tolerance, Decreased strength, Impaired flexibility, Postural dysfunction, Pain, Improper body mechanics, Decreased range of motion, Decreased endurance, Decreased balance, Decreased mobility, Hypomobility, Increased muscle spasms  Visit Diagnosis: Pain in left hip  Muscle weakness (generalized)  Other symptoms and signs involving the musculoskeletal system  Chronic low back pain, unspecified back pain laterality, with sciatica presence unspecified  Other abnormalities of gait and mobility     Problem List Patient Active Problem List   Diagnosis Date Noted  . Thoracic ascending aortic aneurysm ([place]) 05/09/2016  . Thoracic aortic aneurysm without rupture ([place]) 01/17/2016  . Paroxysmal atrial fibrillation ([place]) 12/17/2015  . S/P partial thyroidectomy 01/20/2015   Ihor Austin, [place]; [place]  Aldona Lento 02/27/2017, 12:21 PM  [facility] [address] [place], [place], [address] Phone: [phone]   Fax:  [phone]  Name: Kyle Owen MRN: [id number] Date of Birth: 03-04-1941

## 2017-02-28 ENCOUNTER — Telehealth: Payer: Self-pay | Admitting: Internal Medicine

## 2017-02-28 NOTE — Telephone Encounter (Signed)
Returned patient's call.  Explained monthly summary report purpose and scheduling (automatically transmits via home monitor).  Patient asked if the device has shown any "mini strokes" as he has been having some "visual disturbances".  Discussed purpose of Eliquis in relation to AF and purpose of ILR.  Advised patient to contact his PCP or 911 if the visual disturbances are happening presently.  Patient denies any symptoms right now and states he has an eye doctor appointment soon to discuss the episodes.  He verbalizes understanding of instructions.  Patient is appreciative of call and denies additional questions or concerns at this time.

## 2017-02-28 NOTE — Telephone Encounter (Signed)
New message    Pt has a question about his home remote check.

## 2017-03-01 ENCOUNTER — Ambulatory Visit (HOSPITAL_COMMUNITY): Payer: Medicare Other

## 2017-03-01 DIAGNOSIS — M6281 Muscle weakness (generalized): Secondary | ICD-10-CM | POA: Diagnosis not present

## 2017-03-01 DIAGNOSIS — M545 Low back pain: Secondary | ICD-10-CM

## 2017-03-01 DIAGNOSIS — R29898 Other symptoms and signs involving the musculoskeletal system: Secondary | ICD-10-CM | POA: Diagnosis not present

## 2017-03-01 DIAGNOSIS — R2689 Other abnormalities of gait and mobility: Secondary | ICD-10-CM

## 2017-03-01 DIAGNOSIS — G8929 Other chronic pain: Secondary | ICD-10-CM

## 2017-03-01 DIAGNOSIS — M25552 Pain in left hip: Secondary | ICD-10-CM | POA: Diagnosis not present

## 2017-03-01 NOTE — Therapy (Signed)
[place], [place], [address] Phone: [phone]   Fax:  [phone]  Physical Therapy Treatment  Patient Details  Name: Kyle Owen MRN: [id number] Date of Birth: 06/12/1941 Referring Provider: Asencion Noble, MD  Encounter Date: 03/01/2017      PT End of Session - 03/01/17 1130    Visit Number 6   Number of Visits 9   Date for PT Re-Evaluation 03/13/17   Authorization Type Medicare A and B    Authorization Time Period 02/13/17 to 03/13/17   Authorization - Visit Number 6   Authorization - Number of Visits 10   PT Start Time 1122   PT Stop Time 1206   PT Time Calculation (min) 44 min   Equipment Utilized During Treatment Gait belt   Activity Tolerance Patient tolerated treatment well;No increased pain   Behavior During Therapy WFL for tasks assessed/performed      Past Medical History:  Diagnosis Date  . Arthritis    DJD right knee  . Benign prostatic hypertrophy with urinary frequency   . Bladder cancer ([place])   . COPD with emphysema ([place])   . Depression   . GERD (gastroesophageal reflux disease)   . History of colon polyps   . History of pneumonia    hx Recurrent -- last bout Dec 2016 CAP-- per pt resolved  . 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 Hyperlipidemia   . 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 Nephrolithiasis    right non-obstructive  per CT 05-02-2016  . Ocular migraine    controlled w/ verapamil  . PAF (paroxysmal atrial fibrillation) ([place])    followed by AFIB clinic-- cardiologist-- dr Marlou Porch  . Pulmonary nodule, left    left lower lobe x2 per CT 01-20-2016  . Renal cyst, left   . Thoracic aortic  aneurysm without rupture ([place])    ascending -- ct chest 01/10/16  4.8cm  . Wears hearing aid    bilateral-- wear at times    Past Surgical History:  Procedure Laterality Date  . CATARACT EXTRACTION W/ INTRAOCULAR LENS IMPLANT Right 2016  . CATARACT EXTRACTION W/PHACO  12/16/2012   Procedure: CATARACT EXTRACTION PHACO AND INTRAOCULAR LENS PLACEMENT (IOC);  Surgeon: Tonny Branch, MD;  Location: [facility];  Service: Ophthalmology;  Laterality: Left;  CDE:17.31  . COLONOSCOPY  10/03/2011   Procedure: COLONOSCOPY;  Surgeon: Jamesetta So;  [facility];  Service: Gastroenterology;  Laterality: N/A;  . DECOMPRESSIOIN ULNAR NERVE AND CUBITAL TUNNEL RELEASE Left 07-14-2009   elbow  . 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 INGUINAL HERNIA REPAIR Right 1990's  . KNEE ARTHROSCOPY Right 2015  . LEFT CUBITAL TUNNEL RELEASE    . POSTERIOR LUMBAR FUSION  2014  . SHOULDER ARTHROSCOPY Left 1990's  . TEE WITHOUT CARDIOVERSION N/A 07/27/2015   Procedure: TRANSESOPHAGEAL ECHOCARDIOGRAM (TEE);  Surgeon: Lelon Perla, MD;  Location: [facility] END[facility];  Service: Cardiovascular;  Laterality: N/A;   normal LV function, ef 55-60%,  mild AR and MR, mild dilated ascending aorta (4.2cm),  mild to moderate atherosclerosis  descending aorta,  mild to moderate TR,  negative saline microcavitation study  .  THYROIDECTOMY Right 01/20/2015   Procedure: RIGHT THYROIDECTOMY;  Surgeon: Ascencion Dike, MD;  Location: [place];  Service: ENT;  Laterality: Right;  . TONSILLECTOMY  as child  . TRANSURETHRAL RESECTION OF BLADDER TUMOR N/A 05/15/2016   Procedure: TRANSURETHRAL RESECTION OF BLADDER TUMOR (TURBT) AND INSTILLATION OF EPIRUBICIN;  Surgeon: Franchot Gallo, MD;  Location: [facility];  Service: Urology;  Laterality: N/A;    There were no vitals filed for this visit.      Subjective Assessment - 03/01/17 1125    Subjective Pt stated he feels a  little "wobbly" today, no reports of recent falls.  Reports Lt hip and lower back pain scale 3-5/10.   Pertinent History Arthritis, COPD, depression, GERD, thoracic aortic aneurysm, hx of TIA   Patient Stated Goals improve pain and mobility   Currently in Pain? Yes   Pain Score 5    Pain Location Back  Back and Lt hip   Pain Orientation Lower   Pain Descriptors / Indicators Sore   Pain Type Chronic pain   Pain Radiating Towards Lt hip   Aggravating Factors  sitting, carrying object ~5 lbs. increased standing   Pain Relieving Factors laying flat, traction   Effect of Pain on Daily Activities increased risk of falling, now has to use the [facility]                         [facility] Adult PT Treatment/Exercise - 03/01/17 0001      Lumbar Exercises: Standing   Functional Squats 10 reps   Functional Squats Limitations 3D hip excursion; mat behind to improve form with squats   Other Standing Lumbar Exercises gait without AD and cueing to improve mechanics without antalgiax 2108ft   Other Standing Lumbar Exercises tandem stance 3x 30", SLS Rt 25", LT 14" max of 3; Sidestep RTB 1RT     Lumbar Exercises: Supine   Bridge 10 reps   Other Supine Lumbar Exercises LTR 10x 10"     Lumbar Exercises: Sidelying   Hip Abduction 15 reps     Knee/Hip Exercises: Stretches   Active Hamstring Stretch 3 reps;30 seconds   Active Hamstring Stretch Limitations supine wiht rope   Piriformis Stretch 3 reps;30 seconds   Piriformis Stretch Limitations seated                  PT Short Term Goals - 02/13/17 1305      PT SHORT TERM GOAL #1   Title Pt will demo consistency and independence with his HEP to improve strength, flexibility and pain.    Time 2   Period Weeks   Status New     PT SHORT TERM GOAL #2   Title Pt will demo proper set up and use of a lumbar roll to improve his seated posture and decrease pain.    Time 2   Period Weeks   Status New           PT Long Term Goals  - 02/13/17 1306      PT LONG TERM GOAL #1   Title Pt will demo improved hip strength to atleast 4+/5 MMT, to increase his safety with functional tasks.   Time 4   Period Weeks   Status New     PT LONG TERM GOAL #2   Title Pt will perform SLS on each LE without LOB atleast 20 sec, 2/3 trials, to increase his steadiness with walking.  Time 4   Period Weeks   Status New     PT LONG TERM GOAL #3   Title Pt will complete 5x sit to stand in less than 13 sec without UE support, to demonstrate an improvement in his functional strength and power.    Time 4   Period Weeks   Status New     PT LONG TERM GOAL #4   Title Pt will demo improved Lt hip IR PROM, to within 5 deg of the RLE, to demonstrate an improvement in muscle length and flexibility.    Time 4   Period Weeks   Status New               Plan - 03/01/17 1212    Clinical Impression Statement Continued session focus on improving trunk/proximal strengthing, hip mobility with addition of balance activties to promote stability with gait.  Pt continues to demonstrate impaired hip mobility noted with inability to equalize weight bearing with hip excursion exercise and antalgic gait mechanics wihtout AD due to gluteal med weakness.  Reviewed compliance with HEP with reports of daily compliance.  Min A with balance activties for safety.  EOS pt limited by fatigue, no reports of increased pain through session.     Rehab Potential Good   PT Frequency 2x / week   PT Treatment/Interventions Cryotherapy;Moist Heat;Traction;Therapeutic activities;Functional mobility training;Stair training;Gait training;DME Instruction;Therapeutic exercise;Balance training;Neuromuscular re-education;Patient/family education;Manual techniques;Orthotic Fit/Training;Passive range of motion;Dry needling;Vestibular;Taping   PT Next Visit Plan Continue focus on hip mobility and flexibility.  Continue static balance activities next session.  Stress importance of  posture and use of lumbar roll while seated to improve posture and pain    PT Home Exercise Plan prone hip flexor stretch x5 min at a time 2/22:  prone hip extension, sidelying hip abduction, supine SLR       Patient will benefit from skilled therapeutic intervention in order to improve the following deficits and impairments:  Abnormal gait, Decreased activity tolerance, Decreased strength, Impaired flexibility, Postural dysfunction, Pain, Improper body mechanics, Decreased range of motion, Decreased endurance, Decreased balance, Decreased mobility, Hypomobility, Increased muscle spasms  Visit Diagnosis: Pain in left hip  Muscle weakness (generalized)  Other symptoms and signs involving the musculoskeletal system  Chronic low back pain, unspecified back pain laterality, with sciatica presence unspecified  Other abnormalities of gait and mobility     Problem List Patient Active Problem List   Diagnosis Date Noted  . Thoracic ascending aortic aneurysm ([place]) 05/09/2016  . Thoracic aortic aneurysm without rupture ([place]) 01/17/2016  . Paroxysmal atrial fibrillation ([place]) 12/17/2015  . S/P partial thyroidectomy 01/20/2015   Ihor Austin, [place]; [place]  Aldona Lento 03/01/2017, 12:47 PM  [place] [address] [place], [place], [address] Phone: [phone]   Fax:  [phone]  Name: DOMINGOS RIGGI MRN: [id number] Date of Birth: [birth year]

## 2017-03-01 NOTE — Patient Instructions (Signed)
Abduction: Side Leg Lift (Eccentric) - Side-Lying    Lie on side. Lift top leg slightly higher than shoulder level. Keep top leg straight with body, toes pointing forward. Slowly lower for 3-5 seconds. 15 reps per set, 1-2 sets per day. http://ecce.exer.us/62   Copyright  VHI. All rights reserved.   

## 2017-03-02 ENCOUNTER — Ambulatory Visit (INDEPENDENT_AMBULATORY_CARE_PROVIDER_SITE_OTHER): Payer: Medicare Other | Admitting: *Deleted

## 2017-03-02 DIAGNOSIS — I639 Cerebral infarction, unspecified: Secondary | ICD-10-CM | POA: Diagnosis not present

## 2017-03-02 NOTE — Progress Notes (Signed)
Carelink Summary Report / Loop Recorder 

## 2017-03-06 ENCOUNTER — Ambulatory Visit (HOSPITAL_COMMUNITY): Payer: Medicare Other | Admitting: Physical Therapy

## 2017-03-06 DIAGNOSIS — M545 Low back pain: Secondary | ICD-10-CM | POA: Diagnosis not present

## 2017-03-06 DIAGNOSIS — M25552 Pain in left hip: Secondary | ICD-10-CM | POA: Diagnosis not present

## 2017-03-06 DIAGNOSIS — M6281 Muscle weakness (generalized): Secondary | ICD-10-CM

## 2017-03-06 DIAGNOSIS — R2689 Other abnormalities of gait and mobility: Secondary | ICD-10-CM | POA: Diagnosis not present

## 2017-03-06 DIAGNOSIS — R29898 Other symptoms and signs involving the musculoskeletal system: Secondary | ICD-10-CM | POA: Diagnosis not present

## 2017-03-06 DIAGNOSIS — G8929 Other chronic pain: Secondary | ICD-10-CM

## 2017-03-06 NOTE — Therapy (Signed)
[place], [place], [address] Phone: [phone]   Fax:  [phone]  Physical Therapy Treatment  Patient Details  Name: Kyle Owen MRN: [id number] Date of Birth: 1941-08-31 Referring Provider: Asencion Noble, MD  Encounter Date: 03/06/2017      PT End of Session - 03/06/17 1222    Visit Number 7   Number of Visits 9   Date for PT Re-Evaluation 03/13/17   Authorization Type Medicare A and B    Authorization Time Period 02/13/17 to 03/13/17   Authorization - Visit Number 7   Authorization - Number of Visits 10   PT Start Time 4967   PT Stop Time 1203   PT Time Calculation (min) 47 min   Activity Tolerance Patient tolerated treatment well;No increased pain   Behavior During Therapy WFL for tasks assessed/performed      Past Medical History:  Diagnosis Date  . Arthritis    DJD right knee  . Benign prostatic hypertrophy with urinary frequency   . Bladder cancer ([place])   . COPD with emphysema ([place])   . Depression   . GERD (gastroesophageal reflux disease)   . History of colon polyps   . History of pneumonia    hx Recurrent -- last bout Dec 2016 CAP-- per pt resolved  . 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 Hyperlipidemia   . 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 Nephrolithiasis    right non-obstructive  per CT 05-02-2016  . Ocular migraine    controlled w/ verapamil  . PAF (paroxysmal atrial fibrillation) ([place])    followed by AFIB clinic-- cardiologist-- dr Marlou Porch  . Pulmonary nodule, left    left lower lobe x2 per CT 01-20-2016  . Renal cyst, left   . Thoracic aortic aneurysm without rupture ([place])    ascending -- ct  chest 01/10/16  4.8cm  . Wears hearing aid    bilateral-- wear at times    Past Surgical History:  Procedure Laterality Date  . CATARACT EXTRACTION W/ INTRAOCULAR LENS IMPLANT Right 2016  . CATARACT EXTRACTION W/PHACO  12/16/2012   Procedure: CATARACT EXTRACTION PHACO AND INTRAOCULAR LENS PLACEMENT (IOC);  Surgeon: Tonny Branch, MD;  Location: [facility];  Service: Ophthalmology;  Laterality: Left;  CDE:17.31  . COLONOSCOPY  10/03/2011   Procedure: COLONOSCOPY;  Surgeon: Jamesetta So;  [facility];  Service: Gastroenterology;  Laterality: N/A;  . DECOMPRESSIOIN ULNAR NERVE AND CUBITAL TUNNEL RELEASE Left 07-14-2009   elbow  . 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 INGUINAL HERNIA REPAIR Right 1990's  . KNEE ARTHROSCOPY Right 2015  . LEFT CUBITAL TUNNEL RELEASE    . POSTERIOR LUMBAR FUSION  2014  . SHOULDER ARTHROSCOPY Left 1990's  . TEE WITHOUT CARDIOVERSION N/A 07/27/2015   Procedure: TRANSESOPHAGEAL ECHOCARDIOGRAM (TEE);  Surgeon: Lelon Perla, MD;  Location: [facility] ENDOSCOPY;  Service: Cardiovascular;  Laterality: N/A;   normal LV function, ef 55-60%,  mild AR and MR, mild dilated ascending aorta (4.2cm),  mild to moderate atherosclerosis  descending aorta,  mild to moderate TR,  negative saline microcavitation study  . THYROIDECTOMY Right 01/20/2015   Procedure: RIGHT  THYROIDECTOMY;  Surgeon: Ascencion Dike, MD;  Location: [place];  Service: ENT;  Laterality: Right;  . TONSILLECTOMY  as child  . TRANSURETHRAL RESECTION OF BLADDER TUMOR N/A 05/15/2016   Procedure: TRANSURETHRAL RESECTION OF BLADDER TUMOR (TURBT) AND INSTILLATION OF EPIRUBICIN;  Surgeon: Franchot Gallo, MD;  Location: [facility];  Service: Urology;  Laterality: N/A;    There were no vitals filed for this visit.      Subjective Assessment - 03/06/17 1118    Subjective Pt reports that he is improvinging however he does still have the Lt hip  pain. This is less than what it was. He has been sick the past couple of days and has not been performing his HEP.    Pertinent History Arthritis, COPD, depression, GERD, thoracic aortic aneurysm, hx of TIA   Patient Stated Goals improve pain and mobility   Currently in Pain? Yes   Pain Score 1    Pain Location Hip   Pain Orientation Left   Pain Descriptors / Indicators Aching   Pain Type Chronic pain   Pain Onset More than a month ago   Pain Frequency Constant   Aggravating Factors  sitting, walking, standing, etc.    Pain Relieving Factors laying flat, stretches   Effect of Pain on Daily Activities still unsteady with his walking.             [facility] PT Assessment - 03/06/17 0001      Strength   Left Hip ADduction 4-/5  pain reproduced                     [facility] Adult PT Treatment/Exercise - 03/06/17 0001      Lumbar Exercises: Standing   Other Standing Lumbar Exercises lateral step over high hurdle x10 reps Lt/Rt; glute med isometric hold x3 sec, x10 reps each   Other Standing Lumbar Exercises tandem hold with each LE forward 2x30 sec each.      Knee/Hip Exercises: Stretches   Sports administrator Both;3 reps;30 seconds   Quad Stretch Limitations prone with strap   Other Knee/Hip Stretches Lt hip adductor stretch 3x20 sec                PT Education - 03/06/17 1220    Education provided Yes   Education Details discussed overall progress and upcoming reassessment to determine the need for more visits/goals; technique with therex    Person(s) Educated Patient   Methods Explanation;Demonstration;Handout   Comprehension Verbalized understanding;Returned demonstration          PT Short Term Goals - 02/13/17 1305      PT SHORT TERM GOAL #1   Title Pt will demo consistency and independence with his HEP to improve strength, flexibility and pain.    Time 2   Period Weeks   Status New     PT SHORT TERM GOAL #2   Title Pt will demo proper set up and use  of a lumbar roll to improve his seated posture and decrease pain.    Time 2   Period Weeks   Status New           PT Long Term Goals - 02/13/17 1306      PT LONG TERM GOAL #1   Title Pt will demo improved hip strength to atleast 4+/5 MMT, to increase his safety with functional tasks.   Time 4   Period Weeks   Status New     PT LONG  TERM GOAL #2   Title Pt will perform SLS on each LE without LOB atleast 20 sec, 2/3 trials, to increase his steadiness with walking.    Time 4   Period Weeks   Status New     PT LONG TERM GOAL #3   Title Pt will complete 5x sit to stand in less than 13 sec without UE support, to demonstrate an improvement in his functional strength and power.    Time 4   Period Weeks   Status New     PT LONG TERM GOAL #4   Title Pt will demo improved Lt hip IR PROM, to within 5 deg of the RLE, to demonstrate an improvement in muscle length and flexibility.    Time 4   Period Weeks   Status New               Plan - 03/06/17 1222    Clinical Impression Statement Pt continues to make progress towards his goals with improving hip strength and activity tolerance. Session focused on closed chain therex and STM to address muscle spasm and discomfort with palpation. Pt symptom were reproduced during lateral step over activity with active hip abduction. This, along with reproduction of pain with hip adductor manual muscle testing, and tenderness reported with the hip adductor stretch, indicates possible hip adductor musculature as the primary source of his posterior hip pain. Due to time limitation, this will need to be further assessed at his next visit. Ended session without increase in pain.    Rehab Potential Good   PT Frequency 2x / week   PT Treatment/Interventions Cryotherapy;Moist Heat;Traction;Therapeutic activities;Functional mobility training;Stair training;Gait training;DME Instruction;Therapeutic exercise;Balance training;Neuromuscular  re-education;Patient/family education;Manual techniques;Orthotic Fit/Training;Passive range of motion;Dry needling;Vestibular;Taping   PT Next Visit Plan Continue focus on hip mobility and flexibility.  Continue static balance activities next session.  Stress importance of posture and use of lumbar roll while seated to improve posture and pain    PT Home Exercise Plan prone hip flexor stretch x5 min at a time 2/22:  prone hip extension, sidelying hip abduction, supine SLR       Patient will benefit from skilled therapeutic intervention in order to improve the following deficits and impairments:  Abnormal gait, Decreased activity tolerance, Decreased strength, Impaired flexibility, Postural dysfunction, Pain, Improper body mechanics, Decreased range of motion, Decreased endurance, Decreased balance, Decreased mobility, Hypomobility, Increased muscle spasms  Visit Diagnosis: Pain in left hip  Muscle weakness (generalized)  Other symptoms and signs involving the musculoskeletal system  Chronic low back pain, unspecified back pain laterality, with sciatica presence unspecified  Other abnormalities of gait and mobility     Problem List Patient Active Problem List   Diagnosis Date Noted  . Thoracic ascending aortic aneurysm ([place]) 05/09/2016  . Thoracic aortic aneurysm without rupture ([place]) 01/17/2016  . Paroxysmal atrial fibrillation ([place]) 12/17/2015  . S/P partial thyroidectomy 01/20/2015    12:34 PM,03/06/17 Elly Modena PT, DPT [facility] [place] [address] [place], [place], [address] Phone: [phone]   Fax:  [phone]  Name: Kyle Owen MRN: [id number] Date of Birth: [birth year]

## 2017-03-08 ENCOUNTER — Ambulatory Visit (HOSPITAL_COMMUNITY): Payer: Medicare Other | Admitting: Physical Therapy

## 2017-03-08 DIAGNOSIS — M545 Low back pain: Secondary | ICD-10-CM

## 2017-03-08 DIAGNOSIS — R2689 Other abnormalities of gait and mobility: Secondary | ICD-10-CM

## 2017-03-08 DIAGNOSIS — M25552 Pain in left hip: Secondary | ICD-10-CM | POA: Diagnosis not present

## 2017-03-08 DIAGNOSIS — M6281 Muscle weakness (generalized): Secondary | ICD-10-CM

## 2017-03-08 DIAGNOSIS — G8929 Other chronic pain: Secondary | ICD-10-CM

## 2017-03-08 DIAGNOSIS — R29898 Other symptoms and signs involving the musculoskeletal system: Secondary | ICD-10-CM

## 2017-03-08 NOTE — Therapy (Signed)
[place], [place], [address] Phone: [phone]   Fax:  [phone]  Physical Therapy Treatment/Re-evaluation  Patient Details  Name: Kyle Owen MRN: [id number] Date of Birth: [birth year] Referring Provider: Asencion Noble, MD  Encounter Date: 03/08/2017      PT End of Session - 03/08/17 1209    Visit Number 8   Number of Visits 18   Date for PT Re-Evaluation 04/11/17   Authorization Type Medicare A and B    Authorization Time Period 02/13/17 to 03/13/17; NEW: 03/14/17 to 04/11/17   Authorization - Visit Number 8   Authorization - Number of Visits 10   PT Start Time 1115   PT Stop Time 1204   PT Time Calculation (min) 49 min   Activity Tolerance Patient tolerated treatment well;Patient limited by pain   Behavior During Therapy [facility] for tasks assessed/performed      Past Medical History:  Diagnosis Date  . Arthritis    DJD right knee  . Benign prostatic hypertrophy with urinary frequency   . Bladder cancer ( [place])   . COPD with emphysema ([place])   . Depression   . GERD (gastroesophageal reflux disease)   . History of colon polyps   . History of pneumonia    hx Recurrent -- last bout Dec 2016 CAP-- per pt resolved  . 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 Hyperlipidemia   . 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 Nephrolithiasis    right non-obstructive  per CT 05-02-2016  . Ocular migraine    controlled w/ verapamil  . PAF (paroxysmal atrial fibrillation) ([place])    followed by AFIB clinic-- cardiologist-- dr Marlou Porch  . Pulmonary nodule, left    left lower lobe x2 per CT 01-20-2016  . Renal cyst, left   . Thoracic aortic  aneurysm without rupture ([place])    ascending -- ct chest 01/10/16  4.8cm  . Wears hearing aid    bilateral-- wear at times    Past Surgical History:  Procedure Laterality Date  . CATARACT EXTRACTION W/ INTRAOCULAR LENS IMPLANT Right 2016  . CATARACT EXTRACTION W/PHACO  12/16/2012   Procedure: CATARACT EXTRACTION PHACO AND INTRAOCULAR LENS PLACEMENT (IOC);  Surgeon: Tonny Branch, MD;  Location: [facility];  Service: Ophthalmology;  Laterality: Left;  CDE:17.31  . COLONOSCOPY  10/03/2011   Procedure: COLONOSCOPY;  Surgeon: Jamesetta So;  Location: [facility] ENDO SU[facility];  Service: Gastroenterology;  Laterality: N/A;  . DECOMPRESSIOIN ULNAR NERVE AND CUBITAL TUNNEL RELEASE Left 07-14-2009   elbow  . 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 INGUINAL HERNIA REPAIR Right 1990's  . KNEE ARTHROSCOPY Right 2015  . LEFT CUBITAL TUNNEL RELEASE    . POSTERIOR LUMBAR FUSION  2014  . SHOULDER ARTHROSCOPY Left 1990's  . TEE WITHOUT CARDIOVERSION N/A 07/27/2015   Procedure: TRANSESOPHAGEAL ECHOCARDIOGRAM (TEE);  Surgeon: Lelon Perla, MD;  Location: [facility] ENDOSCOPY;  Service: Cardiovascular;  Laterality: N/A;   normal LV function, ef 55-60%,  mild AR and MR, mild dilated ascending aorta (4.2cm),  mild to moderate atherosclerosis  descending aorta,  mild to moderate TR,  negative saline microcavitation study  . THYROIDECTOMY Right  01/20/2015   Procedure: RIGHT THYROIDECTOMY;  Surgeon: Ascencion Dike, MD;  Location: [place];  Service: ENT;  Laterality: Right;  . TONSILLECTOMY  as child  . TRANSURETHRAL RESECTION OF BLADDER TUMOR N/A 05/15/2016   Procedure: TRANSURETHRAL RESECTION OF BLADDER TUMOR (TURBT) AND INSTILLATION OF EPIRUBICIN;  Surgeon: Franchot Gallo, MD;  Location: [facility];  Service: Urology;  Laterality: N/A;    There were no vitals filed for this visit.      Subjective Assessment - 03/08/17 1117    Subjective Pt reports that he has  made ~30% improvement overall since beginning therapy. He continues to perform some of his HEP but mostly just the stretching. 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    Pertinent History Arthritis, COPD, depression, GERD, thoracic aortic aneurysm, hx of TIA   Patient Stated Goals improve pain and mobility   Currently in Pain? Other (Comment)  No pain when sitting still, but mostly uncomfortable when walking    Pain Onset More than a month ago            [facility] PT Assessment - 03/08/17 0001      Assessment   Medical Diagnosis LBP and hip pain   Referring Provider Asencion Noble, MD   Onset Date/Surgical Date --  atleast 1 year ago   Next MD Visit sometime in July    Prior Therapy OPPT several years ago which helped alot      Balance Screen   Has the patient fallen in the past 6 months Yes   How many times? --  1 prior to starting PT, none since then   Has the patient had a decrease in activity level because of a fear of falling?  Yes   Is the patient reluctant to leave their home because of a fear of falling?  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, enjoys fishing      Cognition   Overall Cognitive Status Within Functional Limits for tasks assessed     Observation/Other Assessments   Observations slouched sitting, forward head/rounded shoulders/decreased lumbar lordosis     Sensation   Light Touch Appears Intact     Posture/Postural Control   Posture Comments sitting slouched to the Rt with decreased lordosis, forward head and rounded shoulders      AROM   Lumbar Flexion --   Lumbar Extension --     Strength   Right Hip Flexion 5/5   Right Hip Extension 4+/5  largely limited due to ROM   Right Hip ABduction 4+/5   Left Hip Flexion 4+/5   Left Hip Extension 4+/5  largely limited  due to ROM   Left Hip ABduction 4+/5   Right/Left Knee Left   Right Knee Extension 5/5   Left Knee Extension 5/5   Right Ankle Dorsiflexion 5/5   Left Ankle Dorsiflexion 5/5     Flexibility   Soft Tissue Assessment /Muscle Length yes  hip IR supine Rt: 40 deg, lacking ~10 deg from neutral    Quadriceps Rt: 25 deg, Lt: 30 deg   Piriformis hip IR: 10 deg on the Lt      Palpation   Palpation comment Tender with palpation along his Lt piriformis region, proximal  Lt hip adductor     Special Tests    Special Tests Hip Special Tests   Hip Special Tests  Saralyn Pilar (FABER) Test;Hip Scouring     Saralyn Pilar [facility]) Test   Findings Positive   Side Left   Comments reproduced pain     Hip Scouring   Findings Negative   Side Left;Right     Transfers   Five time sit to stand comments  20 sec, no UE support, weight shifted to Rt      Ambulation/Gait   Gait Comments Pt ambulating with SPC, decreased stance time on Lt, decreased hip extension on the Lt     High Level Balance   High Level Balance Comments SLS: 30 sec on each, x2 trials.                              PT Education - 03/08/17 1206    Education provided Yes   Education Details discussed progress towards goals; noted areas of remaining limitations and goals moving forward; importance of continuing with gym routine to maintain gains achieved her in therapy.   Person(s) Educated Patient   Methods Explanation   Comprehension Verbalized understanding          PT Short Term Goals - 03/08/17 1144      PT SHORT TERM GOAL #1   Title Pt will demo consistency and independence with his HEP to improve strength, flexibility and pain.    Time 2   Period Weeks   Status Achieved     PT SHORT TERM GOAL #2   Title Pt will demo proper set up and use of a lumbar roll to improve his seated posture and decrease pain.    Time 2   Period Weeks   Status Achieved           PT Long Term Goals - 03/08/17 1145       PT LONG TERM GOAL #1   Title Pt will demo improved hip strength to atleast 4+/5 MMT, to increase his safety with functional tasks.   Time 4   Period Weeks   Status Achieved     PT LONG TERM GOAL #2   Title Pt will perform SLS on each LE without LOB atleast 20 sec, 2/3 trials, to increase his steadiness with walking.    Baseline 30 sec on each   Time 4   Period Weeks   Status Achieved     PT LONG TERM GOAL #3   Title Pt will complete 5x sit to stand in less than 13 sec without UE support, to demonstrate an improvement in his functional strength and power.    Baseline 20 sec, without UE   Time 4   Period Weeks   Status Not Met     PT LONG TERM GOAL #4   Title Pt will demo improved Lt hip IR PROM, to within 5 deg of the RLE, to demonstrate an improvement in muscle length and flexibility.    Time 4   Period Weeks   Status Not Met     PT LONG TERM GOAL #5   Title Pt will demo improved confidence and safety with mobility, evident by his ability to ambulate in the community on firm surfaces without an AD, reporting no close falls/LOB.    Time 4   Period Weeks   Status New  Plan - 03-29-2017 1211    Clinical Impression Statement Pt was reassessed this visit having made progress towards all goals, meeting all but 2 at this point in his rehab. He demonstrates significant improvements in his BLE strength, balance and functional mobility. He continues to use his [facility] for ambulation out in the community, however he reports a decrease in near falls since beginning PT. His remaining limitations include Lt hip stiffness and pain which is impairing his safety with ambulation and limiting his functional strength and endurance. He has severe limitations in Lt hip IR, as well as hip flexion, in addition to (+) FABER testing and (+) Trendelenburg during single leg stance. This causes me to believe that his issues are more related to Lt hip pathology and this was discussed with the pt.  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 He verbalized understanding at this time and is in agreement with the updated proposed PT POC/frequency.   Rehab Potential Good   PT Frequency 2x / week   PT Duration 4 weeks   PT Treatment/Interventions Cryotherapy;Moist Heat;Traction;Therapeutic activities;Functional mobility training;Stair training;Gait training;DME Instruction;Therapeutic exercise;Balance training;Neuromuscular re-education;Patient/family education;Manual techniques;Orthotic Fit/Training;Passive range of motion;Dry needling;Vestibular;Taping   PT Next Visit Plan update HEP: self mobilization posterior glide, hip adductor stretch in supine with bent knee fall out; trial STM Lt adductor to see if pain decreases during weight bearing   PT Home Exercise Plan prone hip flexor stretch x5 min at a time 2/22:  prone hip extension, sidelying hip abduction, supine SLR    Consulted and Agree with Plan of Care Patient      Patient will benefit from skilled therapeutic intervention in order to improve the following deficits and impairments:  Abnormal gait, Decreased activity tolerance, Decreased strength, Impaired flexibility, Postural dysfunction, Pain, Improper body mechanics, Decreased range of motion, Decreased endurance, Decreased balance, Decreased mobility, Hypomobility, Increased muscle spasms  Visit Diagnosis: Pain in left hip - Plan: PT plan of care cert/re-cert  Muscle weakness (generalized) - Plan: PT plan of care cert/re-cert  Other symptoms and signs involving the musculoskeletal system - Plan: PT plan of care cert/re-cert  Chronic low back pain, unspecified back pain laterality, with sciatica presence unspecified - Plan: PT plan of care cert/re-cert  Other abnormalities of gait and mobility - Plan: PT plan of care  cert/re-cert       G-Codes - March 29, 2017 1156    Functional Limitation Mobility: Walking and moving around   Mobility: Walking and Moving Around Current Status [phone]) At least 40 percent but less than 60 percent impaired, limited or restricted   Mobility: Walking and Moving Around Goal Status [phone]) At least 20 percent but less than 40 percent impaired, limited or restricted   Functional Assessment Tool Used Clinical judgement      Problem List Patient Active Problem List   Diagnosis Date Noted  . Thoracic ascending aortic aneurysm ([place]) 05/09/2016  . Thoracic aortic aneurysm without rupture ([place]) 01/17/2016  . Paroxysmal atrial fibrillation ([place]) 12/17/2015  . S/P partial thyroidectomy 01/20/2015    12:27 PM,03-29-2017 Elly Modena PT, DPT [facility] [place] [address] [place], [place], [address] Phone: [phone]   Fax:  [phone]  Name: Kyle Owen MRN: [id number] Date of Birth: [birth year]

## 2017-03-12 ENCOUNTER — Other Ambulatory Visit: Payer: Self-pay | Admitting: Internal Medicine

## 2017-03-12 ENCOUNTER — Other Ambulatory Visit (HOSPITAL_COMMUNITY): Payer: Self-pay | Admitting: Internal Medicine

## 2017-03-12 DIAGNOSIS — I712 Thoracic aortic aneurysm, without rupture: Secondary | ICD-10-CM | POA: Diagnosis not present

## 2017-03-12 DIAGNOSIS — M9971 Connective tissue and disc stenosis of intervertebral foramina of cervical region: Secondary | ICD-10-CM

## 2017-03-12 DIAGNOSIS — H26492 Other secondary cataract, left eye: Secondary | ICD-10-CM | POA: Diagnosis not present

## 2017-03-12 DIAGNOSIS — M50122 Cervical disc disorder at C5-C6 level with radiculopathy: Secondary | ICD-10-CM | POA: Diagnosis not present

## 2017-03-12 DIAGNOSIS — M542 Cervicalgia: Secondary | ICD-10-CM

## 2017-03-12 DIAGNOSIS — H26491 Other secondary cataract, right eye: Secondary | ICD-10-CM | POA: Diagnosis not present

## 2017-03-13 ENCOUNTER — Ambulatory Visit (HOSPITAL_COMMUNITY): Payer: Medicare Other | Admitting: Physical Therapy

## 2017-03-13 ENCOUNTER — Encounter (HOSPITAL_COMMUNITY): Payer: Medicare Other | Admitting: Physical Therapy

## 2017-03-13 DIAGNOSIS — R29898 Other symptoms and signs involving the musculoskeletal system: Secondary | ICD-10-CM | POA: Diagnosis not present

## 2017-03-13 DIAGNOSIS — M6281 Muscle weakness (generalized): Secondary | ICD-10-CM | POA: Diagnosis not present

## 2017-03-13 DIAGNOSIS — R2689 Other abnormalities of gait and mobility: Secondary | ICD-10-CM | POA: Diagnosis not present

## 2017-03-13 DIAGNOSIS — M545 Low back pain: Secondary | ICD-10-CM | POA: Diagnosis not present

## 2017-03-13 DIAGNOSIS — G8929 Other chronic pain: Secondary | ICD-10-CM

## 2017-03-13 DIAGNOSIS — M25552 Pain in left hip: Secondary | ICD-10-CM | POA: Diagnosis not present

## 2017-03-13 NOTE — Therapy (Signed)
[place], [place], [address] Phone: [phone]   Fax:  [phone]  Physical Therapy Treatment  Patient Details  Name: Kyle Owen MRN: [id number] Date of Birth: 05-12-1941 Referring Provider: Asencion Noble, MD  Encounter Date: 03/13/2017      PT End of Session - 03/13/17 1510    Visit Number 9   Number of Visits 18   Date for PT Re-Evaluation 04/11/17   Authorization Type Medicare A and B    Authorization Time Period 02/13/17 to 03/13/17; NEW: 03/14/17 to 04/11/17   Authorization - Visit Number 9   Authorization - Number of Visits 10   PT Start Time 5462   PT Stop Time 1515   PT Time Calculation (min) 41 min   Activity Tolerance Patient tolerated treatment well;Patient limited by pain   Behavior During Therapy [facility] for tasks assessed/performed      Past Medical History:  Diagnosis Date  . Arthritis    DJD right knee  . Benign prostatic hypertrophy with urinary frequency   . Bladder cancer ([place])   . COPD with emphysema ([place])   . Depression   . GERD (gastroesophageal reflux disease)   . History of colon polyps   . History of pneumonia    hx Recurrent -- last bout Dec 2016 CAP-- per pt resolved  . 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 Hyperlipidemia   . 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 Nephrolithiasis    right non-obstructive  per CT 05-02-2016  . Ocular migraine    controlled w/ verapamil  . PAF (paroxysmal atrial fibrillation) ([place])    followed by AFIB clinic-- cardiologist-- dr Marlou Porch  . Pulmonary nodule, left    left lower lobe x2 per CT 01-20-2016  . Renal cyst, left   . Thoracic aortic aneurysm without  rupture ([place])    ascending -- ct chest 01/10/16  4.8cm  . Wears hearing aid    bilateral-- wear at times    Past Surgical History:  Procedure Laterality Date  . CATARACT EXTRACTION W/ INTRAOCULAR LENS IMPLANT Right 2016  . CATARACT EXTRACTION W/PHACO  12/16/2012   Procedure: CATARACT EXTRACTION PHACO AND INTRAOCULAR LENS PLACEMENT (IOC);  Surgeon: Tonny Branch, MD;  Location: [facility];  Service: Ophthalmology;  Laterality: Left;  CDE:17.31  . COLONOSCOPY  10/03/2011   Procedure: COLONOSCOPY;  Surgeon: Jamesetta So;  [facility];  Service: Gastroenterology;  Laterality: N/A;  . DECOMPRESSIOIN ULNAR NERVE AND CUBITAL TUNNEL RELEASE Left 07-14-2009   elbow  . 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 INGUINAL HERNIA REPAIR Right 1990's  . KNEE ARTHROSCOPY Right 2015  . LEFT CUBITAL TUNNEL RELEASE    . POSTERIOR LUMBAR FUSION  2014  . SHOULDER ARTHROSCOPY Left 1990's  . TEE WITHOUT CARDIOVERSION N/A 07/27/2015   Procedure: TRANSESOPHAGEAL ECHOCARDIOGRAM (TEE);  Surgeon: Lelon Perla, MD;  Location: [facility] ENDOSCOPY;  Service: Cardiovascular;  Laterality: N/A;   normal LV function, ef 55-60%,  mild AR and MR, mild dilated ascending aorta (4.2cm),  mild to moderate atherosclerosis  descending aorta,  mild to moderate TR,  negative saline microcavitation study  . THYROIDECTOMY Right  01/20/2015   Procedure: RIGHT THYROIDECTOMY;  Surgeon: Ascencion Dike, MD;  Location: [place];  Service: ENT;  Laterality: Right;  . TONSILLECTOMY  as child  . TRANSURETHRAL RESECTION OF BLADDER TUMOR N/A 05/15/2016   Procedure: TRANSURETHRAL RESECTION OF BLADDER TUMOR (TURBT) AND INSTILLATION OF EPIRUBICIN;  Surgeon: Franchot Gallo, MD;  Location: [facility];  Service: Urology;  Laterality: N/A;    There were no vitals filed for this visit.      Subjective Assessment - 03/13/17 1503    Subjective Pt comes in today with increased reports of  cervical pain that began after last session.  States he is having tingling into Lt UE and pain into Lt cervical and shoulder region.  States he has decreased mobility in both his shoulders.  Went to MD regarding cervical pain and is on prednisone and scheduled to receive MRI Wednesday.  Not having any pain in his LT hip today.   Currently in Pain? No/denies                         [facility] Adult PT Treatment/Exercise - 03/13/17 0001      Lumbar Exercises: Supine   Clam 10 reps   Clam Limitations bilaterally   Bridge 15 reps     Knee/Hip Exercises: Stretches   Active Hamstring Stretch 3 reps;30 seconds   Active Hamstring Stretch Limitations supine wiht rope   Other Knee/Hip Stretches Lt hip adductor stretch 3x20 sec                  PT Short Term Goals - 03/08/17 1144      PT SHORT TERM GOAL #1   Title Pt will demo consistency and independence with his HEP to improve strength, flexibility and pain.    Time 2   Period Weeks   Status Achieved     PT SHORT TERM GOAL #2   Title Pt will demo proper set up and use of a lumbar roll to improve his seated posture and decrease pain.    Time 2   Period Weeks   Status Achieved           PT Long Term Goals - 03/08/17 1145      PT LONG TERM GOAL #1   Title Pt will demo improved hip strength to atleast 4+/5 MMT, to increase his safety with functional tasks.   Time 4   Period Weeks   Status Achieved     PT LONG TERM GOAL #2   Title Pt will perform SLS on each LE without LOB atleast 20 sec, 2/3 trials, to increase his steadiness with walking.    Baseline 30 sec on each   Time 4   Period Weeks   Status Achieved     PT LONG TERM GOAL #3   Title Pt will complete 5x sit to stand in less than 13 sec without UE support, to demonstrate an improvement in his functional strength and power.    Baseline 20 sec, without UE   Time 4   Period Weeks   Status Not Met     PT LONG TERM GOAL #4   Title Pt will demo  improved Lt hip IR PROM, to within 5 deg of the RLE, to demonstrate an improvement in muscle length and flexibility.    Time 4   Period Weeks   Status Not Met     PT LONG TERM GOAL #5   Title Pt  will demo improved confidence and safety with mobility, evident by his ability to ambulate in the community on firm surfaces without an AD, reporting no close falls/LOB.    Time 4   Period Weeks   Status New               Plan - 03/13/17 1511    Clinical Impression Statement Session limited today due to cervical discomfort and inablity to assume prone and pain in standing with stress.  Encouraged pateint to keep UE's mobile to avoid frozen shoulder.  [facility] at [facility].  Pt overall doing well with improved ROM in Lt hip adductor following stretch.    Rehab Potential Good   PT Frequency 2x / week   PT Duration 4 weeks   PT Treatment/Interventions Cryotherapy;Moist Heat;Traction;Therapeutic activities;Functional mobility training;Stair training;Gait training;DME Instruction;Therapeutic exercise;Balance training;Neuromuscular re-education;Patient/family education;Manual techniques;Orthotic Fit/Training;Passive range of motion;Dry needling;Vestibular;Taping   PT Next Visit Plan update HEP next session: self mobilization posterior glide, hip adductor stretch in supine with bent knee fall out.  Resume exercises as able due to cervical issues.   PT Home Exercise Plan prone hip flexor stretch x5 min at a time 2/22:  prone hip extension, sidelying hip abduction, supine SLR    Consulted and Agree with Plan of Care Patient      Patient will benefit from skilled therapeutic intervention in order to improve the following deficits and impairments:  Abnormal gait, Decreased activity tolerance, Decreased strength, Impaired flexibility, Postural dysfunction, Pain, Improper body mechanics, Decreased range of motion, Decreased endurance, Decreased balance, Decreased mobility, Hypomobility, Increased  muscle spasms  Visit Diagnosis: Pain in left hip  Muscle weakness (generalized)  Other symptoms and signs involving the musculoskeletal system  Chronic low back pain, unspecified back pain laterality, with sciatica presence unspecified  Other abnormalities of gait and mobility     Problem List Patient Active Problem List   Diagnosis Date Noted  . Thoracic ascending aortic aneurysm ([place]) 05/09/2016  . Thoracic aortic aneurysm without rupture ([place]) 01/17/2016  . Paroxysmal atrial fibrillation ([place]) 12/17/2015  . S/P partial thyroidectomy 01/20/2015    Teena Irani, PTA/CLT [phone]  03/13/2017, 3:22 PM  [place] [address] [place], [place], [address] Phone: [phone]   Fax:  [phone]  Name: Kyle Owen MRN: [id number] Date of Birth: 1941-08-08

## 2017-03-14 ENCOUNTER — Ambulatory Visit (HOSPITAL_COMMUNITY)
Admission: RE | Admit: 2017-03-14 | Discharge: 2017-03-14 | Disposition: A | Discharge status: Home / Self Care | Payer: Medicare Other | Source: Ambulatory Visit | Attending: Internal Medicine | Admitting: Internal Medicine

## 2017-03-14 ENCOUNTER — Encounter (HOSPITAL_COMMUNITY): Payer: Self-pay

## 2017-03-14 DIAGNOSIS — M542 Cervicalgia: Secondary | ICD-10-CM

## 2017-03-14 DIAGNOSIS — M9971 Connective tissue and disc stenosis of intervertebral foramina of cervical region: Secondary | ICD-10-CM | POA: Diagnosis not present

## 2017-03-14 DIAGNOSIS — M47892 Other spondylosis, cervical region: Secondary | ICD-10-CM | POA: Diagnosis not present

## 2017-03-14 DIAGNOSIS — M4802 Spinal stenosis, cervical region: Secondary | ICD-10-CM | POA: Insufficient documentation

## 2017-03-15 ENCOUNTER — Encounter (HOSPITAL_COMMUNITY): Payer: Medicare Other | Admitting: Physical Therapy

## 2017-03-15 ENCOUNTER — Ambulatory Visit (HOSPITAL_COMMUNITY): Payer: Medicare Other | Admitting: Physical Therapy

## 2017-03-15 DIAGNOSIS — M25552 Pain in left hip: Secondary | ICD-10-CM | POA: Diagnosis not present

## 2017-03-15 DIAGNOSIS — M6281 Muscle weakness (generalized): Secondary | ICD-10-CM

## 2017-03-15 DIAGNOSIS — M545 Low back pain: Secondary | ICD-10-CM | POA: Diagnosis not present

## 2017-03-15 DIAGNOSIS — R2689 Other abnormalities of gait and mobility: Secondary | ICD-10-CM | POA: Diagnosis not present

## 2017-03-15 DIAGNOSIS — G8929 Other chronic pain: Secondary | ICD-10-CM | POA: Diagnosis not present

## 2017-03-15 DIAGNOSIS — R29898 Other symptoms and signs involving the musculoskeletal system: Secondary | ICD-10-CM

## 2017-03-15 LAB — CUP PACEART REMOTE DEVICE CHECK
Date Time Interrogation Session: 20180309174614
Implantable Pulse Generator Implant Date: 20160816

## 2017-03-15 NOTE — Therapy (Signed)
[place], [place], [address] Phone: [phone]   Fax:  [phone]  Physical Therapy Treatment  Patient Details  Name: Kyle Owen MRN: [id number] Date of Birth: 16-May-1941 Referring Provider: Asencion Noble, MD  Encounter Date: 03/15/2017      PT End of Session - 03/15/17 1214    Visit Number 10   Number of Visits 18   Date for PT Re-Evaluation 04/11/17   Authorization Type Medicare A and B    Authorization Time Period 02/13/17 to 03/13/17; NEW: 03/14/17 to 04/11/17   Authorization - Visit Number 10   Authorization - Number of Visits 10   PT Start Time 1034   PT Stop Time 1113   PT Time Calculation (min) 39 min   Activity Tolerance Patient tolerated treatment well;No increased pain   Behavior During Therapy WFL for tasks assessed/performed      Past Medical History:  Diagnosis Date  . Arthritis    DJD right knee  . Benign prostatic hypertrophy with urinary frequency   . Bladder cancer ([place])   . COPD with emphysema ([place])   . Depression   . GERD (gastroesophageal reflux disease)   . History of colon polyps   . History of pneumonia    hx Recurrent -- last bout Dec 2016 CAP-- per pt resolved  . 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 Hyperlipidemia   . 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 Nephrolithiasis    right non-obstructive  per CT 05-02-2016  . Ocular migraine    controlled w/ verapamil  . PAF (paroxysmal atrial fibrillation) ([place])    followed by AFIB clinic-- cardiologist-- dr Marlou Porch  . Pulmonary nodule, left    left lower lobe x2 per CT 01-20-2016  . Renal cyst, left   . Thoracic aortic aneurysm without  rupture ([place])    ascending -- ct chest 01/10/16  4.8cm  . Wears hearing aid    bilateral-- wear at times    Past Surgical History:  Procedure Laterality Date  . CATARACT EXTRACTION W/ INTRAOCULAR LENS IMPLANT Right 2016  . CATARACT EXTRACTION W/PHACO  12/16/2012   Procedure: CATARACT EXTRACTION PHACO AND INTRAOCULAR LENS PLACEMENT (IOC);  Surgeon: Tonny Branch, MD;  Location: [facility];  Service: Ophthalmology;  Laterality: Left;  CDE:17.31  . COLONOSCOPY  10/03/2011   Procedure: COLONOSCOPY;  Surgeon: Jamesetta So;  [facility]: [facility];  Service: Gastroenterology;  Laterality: N/A;  . DECOMPRESSIOIN ULNAR NERVE AND CUBITAL TUNNEL RELEASE Left 07-14-2009   elbow  . 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 INGUINAL HERNIA REPAIR Right 1990's  . KNEE ARTHROSCOPY Right 2015  . LEFT CUBITAL TUNNEL RELEASE    . POSTERIOR LUMBAR FUSION  2014  . SHOULDER ARTHROSCOPY Left 1990's  . TEE WITHOUT CARDIOVERSION N/A 07/27/2015   Procedure: TRANSESOPHAGEAL ECHOCARDIOGRAM (TEE);  Surgeon: Lelon Perla, MD;  Location[facility];  Service: Cardiovascular;  Laterality: N/A;   normal LV function, ef 55-60%,  mild AR and MR, mild dilated ascending aorta (4.2cm),  mild to moderate atherosclerosis  descending aorta,  mild to moderate TR,  negative saline microcavitation study  . THYROIDECTOMY Right 01/20/2015  Procedure: RIGHT THYROIDECTOMY;  Surgeon: Ascencion Dike, MD;  Location: [facility];  Service: ENT;  Laterality: Right;  . TONSILLECTOMY  as child  . TRANSURETHRAL RESECTION OF BLADDER TUMOR N/A 05/15/2016   Procedure: TRANSURETHRAL RESECTION OF BLADDER TUMOR (TURBT) AND INSTILLATION OF EPIRUBICIN;  Surgeon: Franchot Gallo, MD;  Location: [facility];  Service: Urology;  Laterality: N/A;    There were no vitals filed for this visit.      Subjective Assessment - 03/15/17 1055    Subjective Pt reports that he had an MRI and feels  like he will need surgery after he discusses the results with his MD. He is unable to lift a coffee cup without asssitance. Otherwise no other complaints at this time.    Pertinent History Arthritis, COPD, depression, GERD, thoracic aortic aneurysm, hx of TIA   Currently in Pain? No/denies  No hip pain currently.                         [facility] Adult PT Treatment/Exercise - 03/15/17 0001      Knee/Hip Exercises: Stretches   Other Knee/Hip Stretches B hip adductor stretch in // bars 3x20 sec each      Knee/Hip Exercises: Standing   Other Standing Knee Exercises hip hike 2x10 reps each, BUE support   Other Standing Knee Exercises side step over 18" hurdle x2 Lt/Rt; decreased to 12" hurdle 2x5 reps Lt/Rt      Knee/Hip Exercises: Seated   Other Seated Knee/Hip Exercises Lt knee flexion stretch 5x15 sec each      Knee/Hip Exercises: Supine   Other Supine Knee/Hip Exercises Supine Lt bent knee fall out 15x3 sec hold      Manual Therapy   Manual Therapy Joint mobilization   Manual therapy comments separate rest of session    Joint Mobilization Lateral hip MWM x10 reps in // bars                 PT Education - 03/15/17 1213    Education provided Yes   Education Details possibility of placing therapy on hold if pt feels he needs to after possible cervical surgery. technique with therex    Person(s) Educated Patient   Methods Explanation;Tactile cues;Verbal cues   Comprehension Verbalized understanding;Returned demonstration          PT Short Term Goals - 03/08/17 1144      PT SHORT TERM GOAL #1   Title Pt will demo consistency and independence with his HEP to improve strength, flexibility and pain.    Time 2   Period Weeks   Status Achieved     PT SHORT TERM GOAL #2   Title Pt will demo proper set up and use of a lumbar roll to improve his seated posture and decrease pain.    Time 2   Period Weeks   Status Achieved           PT Long Term Goals -  03/08/17 1145      PT LONG TERM GOAL #1   Title Pt will demo improved hip strength to atleast 4+/5 MMT, to increase his safety with functional tasks.   Time 4   Period Weeks   Status Achieved     PT LONG TERM GOAL #2   Title Pt will perform SLS on each LE without LOB atleast 20 sec, 2/3 trials, to increase his steadiness with walking.    Baseline 30 sec on each  Time 4   Period Weeks   Status Achieved     PT LONG TERM GOAL #3   Title Pt will complete 5x sit to stand in less than 13 sec without UE support, to demonstrate an improvement in his functional strength and power.    Baseline 20 sec, without UE   Time 4   Period Weeks   Status Not Met     PT LONG TERM GOAL #4   Title Pt will demo improved Lt hip IR PROM, to within 5 deg of the RLE, to demonstrate an improvement in muscle length and flexibility.    Time 4   Period Weeks   Status Not Met     PT LONG TERM GOAL #5   Title Pt will demo improved confidence and safety with mobility, evident by his ability to ambulate in the community on firm surfaces without an AD, reporting no close falls/LOB.    Time 4   Period Weeks   Status New               Plan - 03/15/17 1215    Clinical Impression Statement Today's session focused on therex to improve Lt hip strength/mobility with primarily closed chain strengthening. Pt did require intermittent rest breaks due to reported fatigue, however there was no increase in cervical/hip pain noted. Currently, he feels he will be having surgery on his neck and we discussed the ability to place PT on hold if this is the direction he decides to go with his MD on Monday. He verbalized understanding.   Rehab Potential Good   PT Frequency 2x / week   PT Duration 4 weeks   PT Treatment/Interventions Cryotherapy;Moist Heat;Traction;Therapeutic activities;Functional mobility training;Stair training;Gait training;DME Instruction;Therapeutic exercise;Balance training;Neuromuscular  re-education;Patient/family education;Manual techniques;Orthotic Fit/Training;Passive range of motion;Dry needling;Vestibular;Taping   PT Next Visit Plan new HEP: standing hip extension into wall, hip abduction (both isometrics); standing functional strength/stability    PT Home Exercise Plan prone hip flexor stretch x5 min at a time 2/22:  prone hip extension, sidelying hip abduction, supine SLR    Consulted and Agree with Plan of Care Patient      Patient will benefit from skilled therapeutic intervention in order to improve the following deficits and impairments:  Abnormal gait, Decreased activity tolerance, Decreased strength, Impaired flexibility, Postural dysfunction, Pain, Improper body mechanics, Decreased range of motion, Decreased endurance, Decreased balance, Decreased mobility, Hypomobility, Increased muscle spasms  Visit Diagnosis: Pain in left hip  Muscle weakness (generalized)  Other symptoms and signs involving the musculoskeletal system  Other abnormalities of gait and mobility  Chronic low back pain, unspecified back pain laterality, with sciatica presence unspecified     Problem List Patient Active Problem List   Diagnosis Date Noted  . Thoracic ascending aortic aneurysm ([place]) 05/09/2016  . Thoracic aortic aneurysm without rupture ([place]) 01/17/2016  . Paroxysmal atrial fibrillation ([place]) 12/17/2015  . S/P partial thyroidectomy 01/20/2015    12:20 PM,03/15/17 Elly Modena PT, DPT [facility] Outpatient Physical Therapy [place] [address] [place], [place], [address] Phone: [phone]   Fax:  [phone]  Name: Kyle Owen MRN: [id number] Date of Birth: 1941/08/01

## 2017-03-19 DIAGNOSIS — M4712 Other spondylosis with myelopathy, cervical region: Secondary | ICD-10-CM | POA: Diagnosis not present

## 2017-03-19 DIAGNOSIS — M5416 Radiculopathy, lumbar region: Secondary | ICD-10-CM | POA: Diagnosis not present

## 2017-03-19 DIAGNOSIS — M5021 Other cervical disc displacement,  high cervical region: Secondary | ICD-10-CM | POA: Diagnosis not present

## 2017-03-19 DIAGNOSIS — M542 Cervicalgia: Secondary | ICD-10-CM | POA: Diagnosis not present

## 2017-03-19 DIAGNOSIS — M5412 Radiculopathy, cervical region: Secondary | ICD-10-CM | POA: Diagnosis not present

## 2017-03-20 ENCOUNTER — Encounter (HOSPITAL_COMMUNITY): Payer: Medicare Other | Admitting: Physical Therapy

## 2017-03-20 ENCOUNTER — Other Ambulatory Visit: Payer: Self-pay | Admitting: Neurosurgery

## 2017-03-20 ENCOUNTER — Ambulatory Visit (HOSPITAL_COMMUNITY): Payer: Medicare Other | Admitting: Physical Therapy

## 2017-03-20 DIAGNOSIS — G8929 Other chronic pain: Secondary | ICD-10-CM

## 2017-03-20 DIAGNOSIS — M545 Low back pain: Secondary | ICD-10-CM

## 2017-03-20 DIAGNOSIS — M25552 Pain in left hip: Secondary | ICD-10-CM | POA: Diagnosis not present

## 2017-03-20 DIAGNOSIS — M6281 Muscle weakness (generalized): Secondary | ICD-10-CM | POA: Diagnosis not present

## 2017-03-20 DIAGNOSIS — R29898 Other symptoms and signs involving the musculoskeletal system: Secondary | ICD-10-CM | POA: Diagnosis not present

## 2017-03-20 DIAGNOSIS — R2689 Other abnormalities of gait and mobility: Secondary | ICD-10-CM

## 2017-03-20 NOTE — Therapy (Signed)
[place], [place], [address] Phone: [phone]   Fax:  [phone]  Physical Therapy Treatment  Patient Details  Name: Kyle Owen MRN: [id number] Date of Birth: [birth year] Referring Provider: Asencion Noble  Encounter Date: 03/20/2017      PT End of Session - 03/20/17 1334    Visit Number 11   Number of Visits 18   Date for PT Re-Evaluation 04/11/17   Authorization Type Medicare A and B    Authorization Time Period 02/13/17 to 03/13/17; NEW: 03/14/17 to 04/11/17   Authorization - Visit Number 11   Authorization - Number of Visits 10   PT Start Time 1300   PT Stop Time 1330   PT Time Calculation (min) 30 min   Activity Tolerance Patient tolerated treatment well;No increased pain   Behavior During Therapy WFL for tasks assessed/performed      Past Medical History:  Diagnosis Date  . Arthritis    DJD right knee  . Benign prostatic hypertrophy with urinary frequency   . Bladder cancer ([place])   . COPD with emphysema ([place])   . Depression   . GERD (gastroesophageal reflux disease)   . History of colon polyps   . History of pneumonia    hx Recurrent -- last bout Dec 2016 CAP-- per pt resolved  . 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 Hyperlipidemia   . 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 Nephrolithiasis    right non-obstructive  per CT 05-02-2016  . Ocular migraine    controlled w/ verapamil  . PAF (paroxysmal atrial fibrillation) ([place])    followed by AFIB clinic-- cardiologist-- dr Marlou Porch  . Pulmonary nodule, left    left lower lobe x2 per CT 01-20-2016  . Renal cyst, left   . Thoracic aortic aneurysm without rupture  ([place])    ascending -- ct chest 01/10/16  4.8cm  . Wears hearing aid    bilateral-- wear at times    Past Surgical History:  Procedure Laterality Date  . CATARACT EXTRACTION W/ INTRAOCULAR LENS IMPLANT Right 2016  . CATARACT EXTRACTION W/PHACO  12/16/2012   Procedure: CATARACT EXTRACTION PHACO AND INTRAOCULAR LENS PLACEMENT (IOC);  Surgeon: Tonny Branch, MD;  Location: [facility];  Service: Ophthalmology;  Laterality: Left;  CDE:17.31  . COLONOSCOPY  10/03/2011   Procedure: COLONOSCOPY;  Surgeon: Jamesetta So;  [facility];  Service: Gastroenterology;  Laterality: N/A;  . DECOMPRESSIOIN ULNAR NERVE AND CUBITAL TUNNEL RELEASE Left 07-14-2009   elbow  . 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 INGUINAL HERNIA REPAIR Right 1990's  . KNEE ARTHROSCOPY Right 2015  . LEFT CUBITAL TUNNEL RELEASE    . POSTERIOR LUMBAR FUSION  2014  . SHOULDER ARTHROSCOPY Left 1990's  . TEE WITHOUT CARDIOVERSION N/A 07/27/2015   Procedure: TRANSESOPHAGEAL ECHOCARDIOGRAM (TEE);  Surgeon: Lelon Perla, MD;  Location: [facility] END[facility]COPY;  Service: Cardiovascular;  Laterality: N/A;   normal LV function, ef 55-60%,  mild AR and MR, mild dilated ascending aorta (4.2cm),  mild to moderate atherosclerosis  descending aorta,  mild to moderate TR,  negative saline microcavitation study  . THYROIDECTOMY Right 01/20/2015  Procedure: RIGHT THYROIDECTOMY;  Surgeon: Ascencion Dike, MD;  Location: [facility];  Service: ENT;  Laterality: Right;  . TONSILLECTOMY  as child  . TRANSURETHRAL RESECTION OF BLADDER TUMOR N/A 05/15/2016   Procedure: TRANSURETHRAL RESECTION OF BLADDER TUMOR (TURBT) AND INSTILLATION OF EPIRUBICIN;  Surgeon: Franchot Gallo, MD;  Location: [facility];  Service: Urology;  Laterality: N/A;    There were no vitals filed for this visit.      Subjective Assessment - 03/20/17 1332    Subjective Pt states he is having surgery on his neck (C3-4)  on April 10th and his surgeon wants him to discontinue therapy at this time.  Reports MD only wants standing exercises for him to continue for his LE.     Currently in Pain? No/denies       The following measurements taken at 03/08/17 visit:       [facility] PT Assessment - 03/20/17 0001      Assessment   Medical Diagnosis LBP and hip pain   Referring Provider Asencion Noble     Observation/Other Assessments   Observations slouched sitting, forward head/rounded shoulders/decreased lumbar lordosis     Strength   Right Hip Flexion 5/5   Right Hip Extension 4+/5   Right Hip ABduction 4+/5   Left Hip Flexion 4+/5   Left Hip Extension 4+/5   Left Hip ABduction 4+/5   Right/Left Knee Left   Right Knee Extension 5/5   Left Knee Extension 5/5   Right Ankle Dorsiflexion 5/5   Left Ankle Dorsiflexion 5/5     Flexibility   Quadriceps Rt: 25 deg, Lt: 30 deg   Piriformis hip IR: 10 deg on the Lt      Palpation   Palpation comment Tender with palpation along his Lt piriformis region, proximal Lt hip adductor     Special Tests    Special Tests Hip Special Tests   Hip Special Tests  Saralyn Pilar (FABER) Test;Hip Scouring     Saralyn Pilar (FABER) Test   Findings Positive   Side Left   Comments reproduced pain     Hip Scouring   Findings Negative   Side Left;Right     Transfers   Five time sit to stand comments  20 sec, no UE support, weight shifted to Rt      Ambulation/Gait   Gait Comments Pt ambulating with SPC, decreased stance time on Lt, decreased hip extension on the Lt     High Level Balance   High Level Balance Comments SLS: 30 sec on each, x2 trials.                      [facility] Adult PT Treatment/Exercise - 03/20/17 0001      Lumbar Exercises: Standing   Other Standing Lumbar Exercises HEP review:  hip abduciton, hip extension, hip hikes, sit to stands and SLS                PT Education - 03/20/17 1315    Education provided Yes   Education Details updated  HEP for hip strengthening    Person(s) Educated Patient   Methods Explanation;Demonstration;Tactile cues;Verbal cues;Handout   Comprehension Verbalized understanding;Returned demonstration          PT Short Term Goals - 03/20/17 1322      PT SHORT TERM GOAL #1   Title Pt will demo consistency and independence with his HEP to improve strength, flexibility and pain.    Time 2  Period Weeks   Status Achieved     PT SHORT TERM GOAL #2   Title Pt will demo proper set up and use of a lumbar roll to improve his seated posture and decrease pain.    Time 2   Period Weeks   Status Achieved           PT Long Term Goals - 03/20/17 1322      PT LONG TERM GOAL #1   Title Pt will demo improved hip strength to atleast 4+/5 MMT, to increase his safety with functional tasks.   Time 4   Period Weeks   Status Achieved     PT LONG TERM GOAL #2   Title Pt will perform SLS on each LE without LOB atleast 20 sec, 2/3 trials, to increase his steadiness with walking.    Baseline 30 sec on each   Time 4   Period Weeks   Status Achieved     PT LONG TERM GOAL #3   Title Pt will complete 5x sit to stand in less than 13 sec without UE support, to demonstrate an improvement in his functional strength and power.    Baseline 20 sec, without UE   Time 4   Period Weeks   Status Not Met     PT LONG TERM GOAL #4   Title Pt will demo improved Lt hip IR PROM, to within 5 deg of the RLE, to demonstrate an improvement in muscle length and flexibility.    Time 4   Period Weeks   Status Not Met     PT LONG TERM GOAL #5   Title Pt will demo improved confidence and safety with mobility, evident by his ability to ambulate in the community on firm surfaces without an AD, reporting no close falls/LOB.    Time 4   Period Weeks   Status New               Plan - 03/20/17 1334    Clinical Impression Statement PT returns today with request to discharge per neurosurgeon due to upcoming cervical  surgery.  Updated HEP to include standing hip extension, abd, hip hikes, SLS and sit to stand actvities to continue to increase LE strength.  Pt able to demonstrate correctly and given written instructions.  Pt was re-evaluated last visit so no new measurements taken this session.  Educated to continue his HEP.    Rehab Potential Good   PT Frequency 2x / week   PT Duration 4 weeks   PT Treatment/Interventions Cryotherapy;Moist Heat;Traction;Therapeutic activities;Functional mobility training;Stair training;Gait training;DME Instruction;Therapeutic exercise;Balance training;Neuromuscular re-education;Patient/family education;Manual techniques;Orthotic Fit/Training;Passive range of motion;Dry needling;Vestibular;Taping   PT Next Visit Plan Discharge per MD request.    PT Home Exercise Plan prone hip flexor stretch x5 min at a time 2/22:  prone hip extension, sidelying hip abduction, supine SLR 3/27: standing hip extension, abduction, hip hikes, sit to stands and single leg stance   Consulted and Agree with Plan of Care Patient      Patient will benefit from skilled therapeutic intervention in order to improve the following deficits and impairments:  Abnormal gait, Decreased activity tolerance, Decreased strength, Impaired flexibility, Postural dysfunction, Pain, Improper body mechanics, Decreased range of motion, Decreased endurance, Decreased balance, Decreased mobility, Hypomobility, Increased muscle spasms  Visit Diagnosis: Pain in left hip  Muscle weakness (generalized)  Chronic low back pain, unspecified back pain laterality, with sciatica presence unspecified  Other symptoms and signs involving the musculoskeletal system  Other abnormalities of gait and mobility     Problem List Patient Active Problem List   Diagnosis Date Noted  . Thoracic ascending aortic aneurysm ([place]) 05/09/2016  . Thoracic aortic aneurysm without rupture ([place]) 01/17/2016  . Paroxysmal atrial fibrillation  ([place]) 12/17/2015  . S/P partial thyroidectomy 01/20/2015    Teena Irani, PTA/CLT [phone]  03/20/2017, 1:38 PM  [facility] [address] [place], [place], [address] Phone: [phone]   Fax:  [phone]  Name: Kyle Owen MRN: [id number] Date of Birth: Apr 09, 1941

## 2017-03-20 NOTE — Patient Instructions (Addendum)
(  Clinic) Hip: Hike / Mobilization (Assist)    Right side toward pulley, foot in strap, leg straight, standing knee slightly bent. Hike hip, tipping pelvis toward ribs.Repeat __10_ times per set. Do _2___ sets per day.  ABDUCTION: Standing (Active)    Stand, feet flat. Lift right leg out to side. Complete _10__ repetitions. Perform _2__ sessions per day.   EXTENSION: Standing (Active)    Stand, both feet flat. Draw right leg behind body as far as possible.Complete _10__ repetitions. Perform _2__ sessions per day.     Single Leg Balance: Eyes Open DO NEAR COUNTER OR SINK    Stand on right leg with eyes open. Hold _up to 30__ seconds. _2__ times per day.  Functional Quadriceps: Sit to Stand    Sit on edge of chair, feet flat on floor. Stand upright, extending knees fully. Repeat __10__ times per set. Do __2__ sessions per day.

## 2017-03-22 ENCOUNTER — Ambulatory Visit (HOSPITAL_COMMUNITY): Payer: Medicare Other | Admitting: Physical Therapy

## 2017-03-23 NOTE — H&P (Signed)
Patient ID:   [id number] Patient: Kyle Owen  Date of Birth: 1941/06/08 Visit Type: Office Visit   Date: 03/19/2017 01:00 PM Provider: Marchia Meiers. Vertell Limber MD   This 76 year old male presents for neck pain.  History of Present Illness: 1.  neck pain    Kyle Owen returns for evaluation of left shoulder pain, left greater than right arm numbness and weakness.  Last seen in 2011, patient notes the symptoms began after he fell asleep on the couch last month.  He is no longer able to participate in gym activities due to his weakness.  Tylenol 500 mg 1 per day Prednisone taper in progress  Eliquis 5 mg b.i.d.Marland Kitchen..  AFib  History:  Arthritis, depression, COPD, bladder cancer, thyroid nodule, AFib, multiple TIA Surgical history:  Left elbow 2010, left shoulder 2000, right inguinal hernia repair 1990, lumbar fusion by Dr. Carloyn Manner 2015, knee scope 2013, partial thyroidectomy 2016, bladder surgery 2017, loop recorder 2016  MRI and x-rays on [place]       PAST MEDICAL HISTORY, SURGICAL HISTORY, FAMILY HISTORY, SOCIAL HISTORY AND REVIEW OF SYSTEMS I have reviewed the patient's past medical, surgical, family and social history as well as the comprehensive review of systems as included on the [facility] history form dated 03/19/2017, which I have signed.   MEDICATIONS(added, continued or stopped this visit): Started Medication Directions Instruction Stopped   finasteride 5 mg tablet take 1 tablet by oral route  every day     sertraline 50 mg tablet take 1 tablet by oral route  every day     Symbicort 160 mcg-4.5 mcg/actuation HFA aerosol inhaler inhale 2 puff by inhalation route 2 times every day in the morning and evening       ALLERGIES:    Vitals Date Temp F BP Pulse Ht In Wt Lb BMI BSA Pain Score  03/19/2017  146/82 76 73 216 28.5  5/10     PHYSICAL EXAM General Level of Distress: no acute distress Overall Appearance: normal  Head and  Face  Right Left  Fundoscopic Exam:  normal normal    Cardiovascular Cardiac: regular rate and rhythm without murmur  Right Left  Carotid Pulses: normal normal  Respiratory Lungs: clear to auscultation  Neurological Orientation: normal Recent and Remote Memory: normal Attention Span and Concentration:   normal Language: normal Fund of Knowledge: normal  Right Left Sensation: normal normal Upper Extremity Coordination: normal normal  Lower Extremity Coordination: normal normal  Musculoskeletal Gait and Station: normal  Right Left Upper Extremity Muscle Strength: normal normal Lower Extremity Muscle Strength: normal normal Upper Extremity Muscle Tone:  normal normal Lower Extremity Muscle Tone: normal normal   Motor Strength Upper and lower extremity motor strength was tested in the clinically pertinent muscles. Any abnormal findings will be noted below.   Right Left Deltoid: 4/5 4-/5   Deep Tendon Reflexes  Right Left Biceps: normal normal Triceps: normal normal Brachioradialis: normal normal Patellar: normal normal Achilles: normal normal  Sensory Sensation was tested at C2 to T1.   Cranial Nerves II. Optic Nerve/Visual Fields: normal III. Oculomotor: normal IV. Trochlear: normal V. Trigeminal: normal VI. Abducens: normal VII. Facial: normal VIII. Acoustic/Vestibular: normal IX. Glossopharyngeal: normal X. Vagus: normal XI. Spinal Accessory: normal XII. 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     IMPRESSION The patient comes in for evaluation of cervical discomfort, left shoulder pain, and left>right arm numbness.  He is unable to lift his left arm above his head and notes significant left>right UE weakness and he reports some numbness in his fingers bilaterally.  He notes these symptoms began 2 weeks ago after napping on the couch.  He is using a cane to walk and reports mild muscular spasms in his left shoulder and some balance issues.  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  On confrontational testing, he has positive Spurling's on the left, 4/5 right deltoid, 4-/5 left deltoid, bilateral interosseous atrophy in first digit, and decreased sensation to pinprick in left shoulder.  X-rays reveal 6.3 mm spinal canal at C3-4 due to stenosis and spondylolisthesis at C4-5 flexion: 3 mm and extension: 2 mm, cervical spondylosis.  Due to the level of his stenosis and weakness I would like to schedule an urgent ACDF C3-4, C4-5.  He will need to come off eliquis 3 days prior to surgery.    Patient is on an anti-coagulant, anti-inflammatory or supplement that may increase bleeding time. Patient advised to stop medicine prior to surgery.  Comments:  Patient will take last dose of Eliquis April 6 for April 10th surgery  Completed Orders (this encounter) Order Details Reason Side Interpretation Result Initial Treatment Date Region  Cervical Spine- AP/Lat/Flex/Ex      03/19/2017 All Levels to All Levels   Assessment/Plan # Detail Type Description   1. Assessment Cervicalgia (M54.2).       2. 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       3. Assessment Lumbar radiculopathy (M54.16).       4. Assessment Cervical radiculopathy (M54.12).       5. 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   Plan Orders Vista Hard Collar Universal Se.         Pain Assessment/Treatment Pain Scale: 5/10. Method: Numeric Pain Intensity Scale. Location: neck and shoulder. Onset: 03/08/2017. Duration: varies. Quality: aching, dull. Pain Assessment/Treatment follow-up plan of care: Patient is not using any methods to relieve pain..  Fall Risk Plan The patient has not fallen in the last year.  Nurse education given.  Fit for vista collar.  Scheduled URGENT ACDF C3-4, C4-5.  Orders: Diagnostic Procedures: Assessment Procedure  M54.12 Cervical Spine- AP/Lat/Flex/Ex  M54.12 Cervical Spine- Lateral  Miscellaneous: Assessment   M47.12 Vista Hard Collar Universal Se             Provider:  Marchia Meiers. Vertell Limber MD  03/21/2017 02:49 PM Dictation edited by: Daine Gravel    CC Providers: Asencion Noble III [address] [place], [place] [address]-              Electronically signed by Marchia Meiers. Vertell Limber MD on 03/23/2017 05:26 PM

## 2017-03-27 ENCOUNTER — Encounter (HOSPITAL_COMMUNITY): Payer: Medicare Other | Admitting: Physical Therapy

## 2017-03-27 NOTE — Pre-Procedure Instructions (Addendum)
    Kyle Owen  03/27/2017      [id number] - [place], [place] [address] Phone: [phone] Fax: [place], [place] [address] Phone: [phone] Fax: [phone]    Your procedure is scheduled on Tues. April 10th.  Report to [facility] Admitting at 9:10 A.M.  Call this number if you have problems the morning of surgery:  [phone]   Remember:  Do not eat food or drink liquids after midnight.  Take these medicines the morning of surgery with A SIP OF WATER symbicort inhaler, finasteride (proscar), sertraline (zoloft), ventolin inhaler,   Stop taking Eliquis per surgeon's orders. Stop 3 days prior to surgery  Stop taking any Vitamins or Herbal Medications. No Aspirin Goody's, BC's, Aleve, Advil, Motrin, Ibuprofen, naproxen or Fish oil.   Do not wear jewelry, make-up or nail polish.  Do not wear lotions, powders, or perfumes, or deoderant.  Do not shave 48 hours prior to surgery.  Men may shave face and neck.  Do not bring valuables to the hospital.  [facility] is not responsible for any belongings or valuables.  Contacts, dentures or bridgework may not be worn into surgery.  Leave your suitcase in the car.  After surgery it may be brought to your room.  For patients admitted to the hospital, discharge time will be determined by your treatment team.  Patients discharged the day of surgery will not be allowed to drive home.    Special instructions:  See attached  Please read over the following fact sheets that you were given. Pain Booklet, Coughing and Deep Breathing, MRSA Information and Surgical Site Infection Prevention

## 2017-03-28 ENCOUNTER — Ambulatory Visit (HOSPITAL_COMMUNITY)
Admission: RE | Admit: 2017-03-28 | Discharge: 2017-03-28 | Disposition: A | Discharge status: Home / Self Care | Payer: Medicare Other | Source: Ambulatory Visit | Attending: Anesthesiology | Admitting: Anesthesiology

## 2017-03-28 ENCOUNTER — Encounter (HOSPITAL_COMMUNITY)
Admission: RE | Admit: 2017-03-28 | Discharge: 2017-03-28 | Disposition: A | Discharge status: Home / Self Care | Payer: Medicare Other | Source: Ambulatory Visit | Attending: Neurosurgery | Admitting: Neurosurgery

## 2017-03-28 ENCOUNTER — Encounter (HOSPITAL_COMMUNITY): Payer: Self-pay

## 2017-03-28 DIAGNOSIS — Z01812 Encounter for preprocedural laboratory examination: Secondary | ICD-10-CM | POA: Diagnosis not present

## 2017-03-28 DIAGNOSIS — J9811 Atelectasis: Secondary | ICD-10-CM | POA: Insufficient documentation

## 2017-03-28 DIAGNOSIS — M4802 Spinal stenosis, cervical region: Secondary | ICD-10-CM | POA: Insufficient documentation

## 2017-03-28 DIAGNOSIS — I700 Atherosclerosis of aorta: Secondary | ICD-10-CM | POA: Diagnosis not present

## 2017-03-28 DIAGNOSIS — Z01818 Encounter for other preprocedural examination: Secondary | ICD-10-CM | POA: Diagnosis not present

## 2017-03-28 HISTORY — DX: Cardiac arrhythmia, unspecified: I49.9

## 2017-03-28 LAB — BASIC METABOLIC PANEL
Anion gap: 7 (ref 5–15)
BUN: 29 mg/dL — ABNORMAL HIGH (ref 6–20)
CO2: 27 mmol/L (ref 22–32)
Calcium: 9.2 mg/dL (ref 8.9–10.3)
Chloride: 105 mmol/L (ref 101–111)
Creatinine, Ser: 1.33 mg/dL — ABNORMAL HIGH (ref 0.61–1.24)
GFR calc Af Amer: 58 mL/min — ABNORMAL LOW (ref >60)
GFR calc non Af Amer: 50 mL/min — ABNORMAL LOW (ref >60)
Glucose, Bld: 94 mg/dL (ref 65–99)
Potassium: 4.5 mmol/L (ref 3.5–5.1)
Sodium: 139 mmol/L (ref 135–145)

## 2017-03-28 LAB — CBC
HCT: 44.6 % (ref 39.0–52.0)
Hemoglobin: 14.1 g/dL (ref 13.0–17.0)
MCH: 31.5 pg (ref 26.0–34.0)
MCHC: 31.6 g/dL (ref 30.0–36.0)
MCV: 99.8 fL (ref 78.0–100.0)
Platelets: 152 10*3/uL (ref 150–400)
RBC: 4.47 MIL/uL (ref 4.22–5.81)
RDW: 14.4 % (ref 11.5–15.5)
WBC: 6.6 10*3/uL (ref 4.0–10.5)

## 2017-03-28 LAB — TYPE AND SCREEN
ABO/RH(D): A POS
Antibody Screen: NEGATIVE

## 2017-03-28 LAB — ABO/RH: ABO/RH(D): A POS

## 2017-03-28 LAB — SURGICAL PCR SCREEN
MRSA, PCR: NEGATIVE
Staphylococcus aureus: NEGATIVE

## 2017-03-28 NOTE — Progress Notes (Signed)
PCP: Dr. Acie Fredrickson request notes/ekg  Cardiologist: Dr. Nikki Dom and Dr. Alena Bills. Instructed to stop eliquis 3 days prior to surgery. Last dose fri. 03/30/17.

## 2017-03-28 NOTE — Progress Notes (Signed)
Anesthesia PAT Evaluation: Patient is a 76 year old male scheduled for C3-4, C4-5 ACDF on 04/03/17 by Dr. Vertell Limber.  History includes former smoker (quit '11), HLD, TIA X 3 03/2015 and right amaurosis fugax 11/215, PAF (diagnosed on loop recorder placed 08/10/15 due to TIAs--started on Eliquis), ascending TAA, lung nodules (inflammatory, left; granulomas, right), COPD/emphysema, dyspnea of exertion, thyroid nodules s/p right hemithyroidectomy 01/20/15, left renal cyst, nephrolithiasis, BPH, bladder cancer s/p TURBT 05/15/16, hearing aids, occular migraines (on verapamil), lumbar fusion '14.   - PCP is Dr. Asencion Noble, last visit 03/12/17. - Cardiologist is Candee Furbish, last visit 11/30/16. Dr. Marlou Porch gave permission to hold Eliquis 3 days prior to surgery.  - EP cardiologist is Dr. Thompson Grayer. No new observations on loop recorder interrogation 03/02/17. -CT surgeon is Dr. [place] Gum, last visit 08/09/16. TAA was stable in size, so one year follow-up recommended. - Neurologist is Dr. Andrey Spearman. - Urologist is Dr. Franchot Gallo.  Meds include Lipitor, Symbicort, Eliquis (last dose 03/22/17), finasteride, magnesium, Zoloft, verapamil, Ventolin.  BP (!) 148/57   Pulse 76   Temp 36.3 C   Resp 20   Ht 6\' 1"  (1.854 m)   Wt 217 lb 4.8 oz (98.6 kg)   SpO2 96%   BMI 28.67 kg/m  I evaluated patient due to some worsening of DOE over the past several months. He has noted symptoms for at least 2 years, but after developing sinus/URI infections in 12/2016 and 02/2017, he has noticed his breathing is not quite back to baseline. Apparently, he had some hypoxia with at least one of his infections (wife said O2 sat got to 83-84%, but he says 88%). He would not go to the hospital, but Dr. Willey Blade did treat him with antibiotic therapy. He still reports some mild sinus congestion, but no fevers in a month. No persistent cough. No SOB at rest. No chest pain. He has known PAF, but cannot tell when he is in afib. Denied  prior stress test or cath. He [place] acres and is typically very active (deer hunting, planting, cutting wood, walking his dog). He was able to do all these activities in the fall. Since his URI, he notices that he may be SOB after walking 30 feet to the bathroom which is more than his usual (typically would have DOE with activities such as walking up an incline). He has also not been able to be as active over the past few months, but more so in the last month when he awoke from a nap and had BUE N/T and pain with decreased movement in his LUE. He saw Dr. Willey Blade a few days later and was referred to Dr. Vertell Limber and started on prednisone. Dr. Vertell Limber recommended surgery, but discontinued the steroid due to concern for wound healing. He has also noted he does not have the strength in his legs and has been prone to falls. He is in a hospital wheelchair today. He can move all extremities, but cannot lift his LUE without assistance and is only able to partially raise his RUE. Reportedly, Dr. Vertell Limber has told him that he may not get full use of his extremities, even after surgery which worries him. He has had BLE edema (particularly feet) for the past year, but has noticed quite a bit of improvement with wearing compression stocking. Heart RRR, no murmur noted. Lungs clear. Trace pre-tibial edema. No carotid bruits noted. Mallampati II. No conversational dyspnea.  EKG 02/01/17 (Dr. Willey Blade): NSR.  Echo 06/10/15: Study Conclusions - Left ventricle: The cavity size was normal. Wall thickness was   normal. Systolic function was normal. The estimated ejection   fraction was in the range of 60% to 65%. Wall motion was normal;   there were no regional wall motion abnormalities. Doppler   parameters are consistent with abnormal left ventricular   relaxation (grade 1 diastolic dysfunction). The E/e&' ratio is <8,   suggesting normal LV filling pressure. - Aortic valve: Trileaflet. Sclerosis without stenosis. There was    trivial regurgitation. - Left atrium: The atrium was normal in size. - Right atrium: Moderately dilated at 25 cm2. - Tricuspid valve: There was mild regurgitation. - Pulmonary arteries: PA peak pressure: 29 mm Hg (S). - Inferior vena cava: The vessel was normal in size. The   respirophasic diameter changes were in the normal range (= 50%),   consistent with normal central venous pressure. Impressions: - Compared to a prior echo in 2015, there are few changes. RVSP is   lower at 29 mmHg.  Carotid U/S 10/29/14: IMPRESSION: No focal hemodynamically significant stenosis is noted. Incidental note is made of a right thyroid nodule. Full thyroid ultrasound may be helpful for further evaluation and to delineate any other thyroid nodules. (MRA neck 06/21/15: Normal)  CXR 03/28/17: IMPRESSION: Left base atelectasis. Mild scarring right base. No edema or consolidation. Stable cardiac silhouette. There is aortic atherosclerosis.  Chest CT wo contrast 07/26/16: IMPRESSION: 1. The 7 mm nodule noted on the CT of 12/06/2015 within the posterior medial left lower lobe is no longer seen and most likely was inflammatory in nature. No new or enlarging lung nodule is seen. No further followup is necessary. 2. Changes of prior granulomatous disease with calcified right upper lobe granulomas, calcified mediastinal and right hilar nodes, and hepatic as well as splenic calcified granulomas. 3. 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 4. Moderate thoracic aortic atherosclerosis. 5. Some calcification of the circumflex and right coronary arteries.  MRI c-spine 03/14/17: IMPRESSION: - Multilevel spondylosis as described. Potentially symptomatic neural impingement at C3-4 through T1-T2. See discussion above (see full report under Results Review tab). - Spinal stenosis is most pronounced at C3-4 and C5-6 where cord flattening is observed, without abnormal cord  signal.  Preoperative labs noted. Cr 1.33, stable. CBC WNL.  Patient with more pronounced DOE over the past few months, probably multifactorial. He said symptoms noticed after his two URIs earlier this year. Energy not yet back to baseline either. He has also been unable to be as active due to BUE pain/weakness and also some instability with his walking which could be causing some deconditioning. He has not had any chest pain and is not a diabetic. EKG a few months ago was normal. I think his mood is slightly depressed today as he is concerned he may not have full function of his extremities following surgery and was wanting surgery sooner. His lungs sounds are clear today, O2 sats 96%, CXR did not show PNA (deep breathing exercises reviewed today). By auscultation, appears to be in SR today. Cardiology is aware of surgery plans. Discussed with anesthesiologist Dr. Lissa Hoard. If no acute changes then it is anticipated that he can proceed as planned.  George Hugh [facility] Short Stay Center/Anesthesiology Phone [phone] 03/28/2017 2:40 PM

## 2017-03-29 ENCOUNTER — Encounter (HOSPITAL_COMMUNITY): Payer: Medicare Other | Admitting: Physical Therapy

## 2017-04-02 ENCOUNTER — Ambulatory Visit (INDEPENDENT_AMBULATORY_CARE_PROVIDER_SITE_OTHER): Payer: Medicare Other | Admitting: *Deleted

## 2017-04-02 DIAGNOSIS — I639 Cerebral infarction, unspecified: Secondary | ICD-10-CM | POA: Diagnosis not present

## 2017-04-03 ENCOUNTER — Ambulatory Visit (HOSPITAL_COMMUNITY): Payer: Medicare Other

## 2017-04-03 ENCOUNTER — Ambulatory Visit (HOSPITAL_COMMUNITY): Payer: Medicare Other | Admitting: Vascular Surgery

## 2017-04-03 ENCOUNTER — Inpatient Hospital Stay (HOSPITAL_COMMUNITY)
Admission: RE | Admit: 2017-04-03 | Discharge: 2017-04-04 | DRG: 473 | Disposition: A | Discharge status: Home / Self Care | Payer: Medicare Other | Source: Ambulatory Visit | Attending: Neurosurgery | Admitting: Neurosurgery

## 2017-04-03 ENCOUNTER — Encounter (HOSPITAL_COMMUNITY)
Admission: RE | Disposition: A | Discharge status: Home / Self Care | Payer: Self-pay | Source: Ambulatory Visit | Attending: Neurosurgery

## 2017-04-03 ENCOUNTER — Encounter (HOSPITAL_COMMUNITY): Payer: Self-pay | Admitting: Certified Registered Nurse Anesthetist

## 2017-04-03 ENCOUNTER — Encounter (HOSPITAL_COMMUNITY): Payer: Medicare Other | Admitting: Physical Therapy

## 2017-04-03 ENCOUNTER — Ambulatory Visit (HOSPITAL_COMMUNITY): Payer: Medicare Other | Admitting: Certified Registered Nurse Anesthetist

## 2017-04-03 DIAGNOSIS — J449 Chronic obstructive pulmonary disease, unspecified: Secondary | ICD-10-CM | POA: Diagnosis present

## 2017-04-03 DIAGNOSIS — E890 Postprocedural hypothyroidism: Secondary | ICD-10-CM | POA: Diagnosis present

## 2017-04-03 DIAGNOSIS — M4802 Spinal stenosis, cervical region: Secondary | ICD-10-CM | POA: Diagnosis not present

## 2017-04-03 DIAGNOSIS — G959 Disease of spinal cord, unspecified: Secondary | ICD-10-CM | POA: Diagnosis not present

## 2017-04-03 DIAGNOSIS — Z419 Encounter for procedure for purposes other than remedying health state, unspecified: Secondary | ICD-10-CM

## 2017-04-03 DIAGNOSIS — M5416 Radiculopathy, lumbar region: Secondary | ICD-10-CM | POA: Diagnosis present

## 2017-04-03 DIAGNOSIS — Z9889 Other specified postprocedural states: Secondary | ICD-10-CM | POA: Diagnosis not present

## 2017-04-03 DIAGNOSIS — M4322 Fusion of spine, cervical region: Secondary | ICD-10-CM | POA: Diagnosis not present

## 2017-04-03 DIAGNOSIS — M5011 Cervical disc disorder with radiculopathy,  high cervical region: Secondary | ICD-10-CM | POA: Diagnosis not present

## 2017-04-03 DIAGNOSIS — Z8673 Personal history of transient ischemic attack (TIA), and cerebral infarction without residual deficits: Secondary | ICD-10-CM | POA: Diagnosis not present

## 2017-04-03 DIAGNOSIS — M5001 Cervical disc disorder with myelopathy,  high cervical region: Secondary | ICD-10-CM | POA: Diagnosis not present

## 2017-04-03 DIAGNOSIS — Z7901 Long term (current) use of anticoagulants: Secondary | ICD-10-CM

## 2017-04-03 DIAGNOSIS — M1990 Unspecified osteoarthritis, unspecified site: Secondary | ICD-10-CM | POA: Diagnosis present

## 2017-04-03 DIAGNOSIS — M5412 Radiculopathy, cervical region: Secondary | ICD-10-CM | POA: Diagnosis present

## 2017-04-03 DIAGNOSIS — I4891 Unspecified atrial fibrillation: Secondary | ICD-10-CM | POA: Diagnosis present

## 2017-04-03 DIAGNOSIS — C679 Malignant neoplasm of bladder, unspecified: Secondary | ICD-10-CM | POA: Diagnosis present

## 2017-04-03 DIAGNOSIS — M4712 Other spondylosis with myelopathy, cervical region: Principal | ICD-10-CM | POA: Diagnosis present

## 2017-04-03 DIAGNOSIS — Z79899 Other long term (current) drug therapy: Secondary | ICD-10-CM

## 2017-04-03 HISTORY — PX: ANTERIOR CERVICAL DECOMP/DISCECTOMY FUSION: SHX1161

## 2017-04-03 LAB — PROTIME-INR
INR: 1.06
Prothrombin Time: 13.8 seconds (ref 11.4–15.2)

## 2017-04-03 SURGERY — ANTERIOR CERVICAL DECOMPRESSION/DISCECTOMY FUSION 2 LEVELS
Anesthesia: General | Site: Neck

## 2017-04-03 MED ORDER — BISACODYL 10 MG RE SUPP: 10.0000 mg | Freq: Every day | RECTAL | Status: DC | PRN

## 2017-04-03 MED ORDER — PHENOL 1.4 % MT LIQD
1.0000 | OROMUCOSAL | Status: DC | PRN
  Filled 2017-04-03: qty 177

## 2017-04-03 MED ORDER — ONDANSETRON HCL 4 MG/2ML IJ SOLN: 4.0000 mg | Freq: Four times a day (QID) | INTRAMUSCULAR | Status: DC | PRN

## 2017-04-03 MED ORDER — LIDOCAINE 2% (20 MG/ML) 5 ML SYRINGE
INTRAMUSCULAR | Status: DC | PRN
  Administered 2017-04-03: 60 mg via INTRAVENOUS

## 2017-04-03 MED ORDER — POLYETHYLENE GLYCOL 3350 17 G PO PACK: 17.0000 g | PACK | Freq: Every day | ORAL | Status: DC | PRN

## 2017-04-03 MED ORDER — PANTOPRAZOLE SODIUM 40 MG IV SOLR: 40.0000 mg | Freq: Every day | INTRAVENOUS | Status: DC

## 2017-04-03 MED ORDER — VERAPAMIL HCL ER 240 MG PO TBCR
240.0000 mg | EXTENDED_RELEASE_TABLET | Freq: Every morning | ORAL | Status: DC
  Filled 2017-04-03: qty 1

## 2017-04-03 MED ORDER — ACETAMINOPHEN 500 MG PO TABS
500.0000 mg | ORAL_TABLET | ORAL | Status: DC | PRN
  Administered 2017-04-03: 500 mg via ORAL
  Filled 2017-04-03: qty 1

## 2017-04-03 MED ORDER — ONDANSETRON HCL 4 MG/2ML IJ SOLN
INTRAMUSCULAR | Status: DC | PRN
  Administered 2017-04-03: 4 mg via INTRAVENOUS

## 2017-04-03 MED ORDER — KCL IN DEXTROSE-NACL 20-5-0.45 MEQ/L-%-% IV SOLN: INTRAVENOUS | Status: DC

## 2017-04-03 MED ORDER — 0.9 % SODIUM CHLORIDE (POUR BTL) OPTIME
TOPICAL | Status: DC | PRN
  Administered 2017-04-03: 1000 mL

## 2017-04-03 MED ORDER — FENTANYL CITRATE (PF) 100 MCG/2ML IJ SOLN: 25.0000 ug | INTRAMUSCULAR | Status: DC | PRN

## 2017-04-03 MED ORDER — EPHEDRINE 5 MG/ML INJ
INTRAVENOUS | Status: AC
  Filled 2017-04-03: qty 10

## 2017-04-03 MED ORDER — METHOCARBAMOL 500 MG PO TABS
500.0000 mg | ORAL_TABLET | Freq: Four times a day (QID) | ORAL | Status: DC | PRN
  Administered 2017-04-03: 500 mg via ORAL
  Filled 2017-04-03: qty 1

## 2017-04-03 MED ORDER — CEFAZOLIN SODIUM 1 G IJ SOLR
INTRAMUSCULAR | Status: AC
  Filled 2017-04-03: qty 10

## 2017-04-03 MED ORDER — MAGNESIUM 400 MG PO CAPS: 400.0000 mg | ORAL_CAPSULE | Freq: Every morning | ORAL | Status: DC

## 2017-04-03 MED ORDER — METHOCARBAMOL 1000 MG/10ML IJ SOLN
500.0000 mg | Freq: Four times a day (QID) | INTRAVENOUS | Status: DC | PRN
  Filled 2017-04-03: qty 5

## 2017-04-03 MED ORDER — CEFAZOLIN SODIUM-DEXTROSE 2-4 GM/100ML-% IV SOLN
INTRAVENOUS | Status: AC
  Filled 2017-04-03: qty 100

## 2017-04-03 MED ORDER — LIDOCAINE 2% (20 MG/ML) 5 ML SYRINGE
INTRAMUSCULAR | Status: AC
  Filled 2017-04-03: qty 15

## 2017-04-03 MED ORDER — SODIUM CHLORIDE 0.9 % IV SOLN: 250.0000 mL | INTRAVENOUS | Status: DC

## 2017-04-03 MED ORDER — ATORVASTATIN CALCIUM 20 MG PO TABS
20.0000 mg | ORAL_TABLET | Freq: Every day | ORAL | Status: DC
  Filled 2017-04-03: qty 1

## 2017-04-03 MED ORDER — FENTANYL CITRATE (PF) 250 MCG/5ML IJ SOLN
INTRAMUSCULAR | Status: AC
  Filled 2017-04-03: qty 5

## 2017-04-03 MED ORDER — BUPIVACAINE HCL (PF) 0.5 % IJ SOLN
INTRAMUSCULAR | Status: DC | PRN
  Administered 2017-04-03: 5 mL

## 2017-04-03 MED ORDER — SUCCINYLCHOLINE CHLORIDE 200 MG/10ML IV SOSY
PREFILLED_SYRINGE | INTRAVENOUS | Status: AC
  Filled 2017-04-03: qty 10

## 2017-04-03 MED ORDER — MORPHINE SULFATE (PF) 4 MG/ML IV SOLN: 1.0000 mg | INTRAVENOUS | Status: DC | PRN

## 2017-04-03 MED ORDER — PHENYLEPHRINE HCL 10 MG/ML IJ SOLN
INTRAVENOUS | Status: DC | PRN
  Administered 2017-04-03: 30 ug/min via INTRAVENOUS

## 2017-04-03 MED ORDER — LIDOCAINE-EPINEPHRINE (PF) 2 %-1:200000 IJ SOLN
INTRAMUSCULAR | Status: AC
  Filled 2017-04-03: qty 20

## 2017-04-03 MED ORDER — MOMETASONE FURO-FORMOTEROL FUM 200-5 MCG/ACT IN AERO
2.0000 | INHALATION_SPRAY | Freq: Two times a day (BID) | RESPIRATORY_TRACT | Status: DC
  Filled 2017-04-03: qty 8.8

## 2017-04-03 MED ORDER — ZOLPIDEM TARTRATE 5 MG PO TABS: 5.0000 mg | ORAL_TABLET | Freq: Every evening | ORAL | Status: DC | PRN

## 2017-04-03 MED ORDER — HEMOSTATIC AGENTS (NO CHARGE) OPTIME
TOPICAL | Status: DC | PRN
  Administered 2017-04-03: 1 via TOPICAL

## 2017-04-03 MED ORDER — LIDOCAINE-EPINEPHRINE 1 %-1:100000 IJ SOLN
INTRAMUSCULAR | Status: DC | PRN
  Administered 2017-04-03: 5 mL

## 2017-04-03 MED ORDER — DEXAMETHASONE SODIUM PHOSPHATE 10 MG/ML IJ SOLN
INTRAMUSCULAR | Status: AC
  Filled 2017-04-03: qty 3

## 2017-04-03 MED ORDER — ONDANSETRON HCL 4 MG/2ML IJ SOLN: INTRAMUSCULAR | Status: DC | PRN

## 2017-04-03 MED ORDER — PHENYLEPHRINE 40 MCG/ML (10ML) SYRINGE FOR IV PUSH (FOR BLOOD PRESSURE SUPPORT)
PREFILLED_SYRINGE | INTRAVENOUS | Status: AC
  Filled 2017-04-03: qty 20

## 2017-04-03 MED ORDER — EPHEDRINE SULFATE-NACL 50-0.9 MG/10ML-% IV SOSY
PREFILLED_SYRINGE | INTRAVENOUS | Status: DC | PRN
  Administered 2017-04-03 (×2): 5 mg via INTRAVENOUS

## 2017-04-03 MED ORDER — OXYCODONE HCL 5 MG PO TABS: 5.0000 mg | ORAL_TABLET | Freq: Once | ORAL | Status: DC | PRN

## 2017-04-03 MED ORDER — THROMBIN 5000 UNITS EX SOLR
CUTANEOUS | Status: AC
  Filled 2017-04-03: qty 15000

## 2017-04-03 MED ORDER — ONDANSETRON HCL 4 MG PO TABS: 4.0000 mg | ORAL_TABLET | Freq: Four times a day (QID) | ORAL | Status: DC | PRN

## 2017-04-03 MED ORDER — SUGAMMADEX SODIUM 200 MG/2ML IV SOLN
INTRAVENOUS | Status: DC | PRN
  Administered 2017-04-03: 100 mg via INTRAVENOUS

## 2017-04-03 MED ORDER — FENTANYL CITRATE (PF) 100 MCG/2ML IJ SOLN
INTRAMUSCULAR | Status: DC | PRN
  Administered 2017-04-03: 200 ug via INTRAVENOUS

## 2017-04-03 MED ORDER — ACETAMINOPHEN 650 MG RE SUPP: 650.0000 mg | RECTAL | Status: DC | PRN

## 2017-04-03 MED ORDER — FLEET ENEMA 7-19 GM/118ML RE ENEM: 1.0000 | ENEMA | Freq: Once | RECTAL | Status: DC | PRN

## 2017-04-03 MED ORDER — SODIUM CHLORIDE 0.9% FLUSH: 3.0000 mL | INTRAVENOUS | Status: DC | PRN

## 2017-04-03 MED ORDER — THROMBIN 5000 UNITS EX SOLR
CUTANEOUS | Status: DC | PRN
  Administered 2017-04-03 (×2): 5000 [IU] via TOPICAL

## 2017-04-03 MED ORDER — SODIUM CHLORIDE 0.9% FLUSH: 3.0000 mL | Freq: Two times a day (BID) | INTRAVENOUS | Status: DC

## 2017-04-03 MED ORDER — CEFAZOLIN SODIUM-DEXTROSE 2-4 GM/100ML-% IV SOLN
2.0000 g | INTRAVENOUS | Status: AC
  Administered 2017-04-03: 2 g via INTRAVENOUS

## 2017-04-03 MED ORDER — CHLORHEXIDINE GLUCONATE CLOTH 2 % EX PADS: 6.0000 | MEDICATED_PAD | Freq: Once | CUTANEOUS | Status: DC

## 2017-04-03 MED ORDER — PROPOFOL 10 MG/ML IV BOLUS
INTRAVENOUS | Status: DC | PRN
  Administered 2017-04-03: 60 mg via INTRAVENOUS

## 2017-04-03 MED ORDER — THROMBIN 5000 UNITS EX SOLR
OROMUCOSAL | Status: DC | PRN
  Administered 2017-04-03: 14:00:00 via TOPICAL

## 2017-04-03 MED ORDER — ALBUTEROL SULFATE (2.5 MG/3ML) 0.083% IN NEBU: 3.0000 mL | INHALATION_SOLUTION | Freq: Four times a day (QID) | RESPIRATORY_TRACT | Status: DC | PRN

## 2017-04-03 MED ORDER — PHENYLEPHRINE 40 MCG/ML (10ML) SYRINGE FOR IV PUSH (FOR BLOOD PRESSURE SUPPORT)
PREFILLED_SYRINGE | INTRAVENOUS | Status: DC | PRN
  Administered 2017-04-03: 40 ug via INTRAVENOUS

## 2017-04-03 MED ORDER — ONDANSETRON HCL 4 MG/2ML IJ SOLN
INTRAMUSCULAR | Status: AC
  Filled 2017-04-03: qty 6

## 2017-04-03 MED ORDER — FINASTERIDE 5 MG PO TABS: 5.0000 mg | ORAL_TABLET | Freq: Every morning | ORAL | Status: DC

## 2017-04-03 MED ORDER — ROCURONIUM BROMIDE 50 MG/5ML IV SOSY
PREFILLED_SYRINGE | INTRAVENOUS | Status: AC
  Filled 2017-04-03: qty 20

## 2017-04-03 MED ORDER — SERTRALINE HCL 50 MG PO TABS: 50.0000 mg | ORAL_TABLET | Freq: Every morning | ORAL | Status: DC

## 2017-04-03 MED ORDER — ADULT MULTIVITAMIN W/MINERALS CH: ORAL_TABLET | Freq: Every morning | ORAL | Status: DC

## 2017-04-03 MED ORDER — ROCURONIUM BROMIDE 10 MG/ML (PF) SYRINGE
PREFILLED_SYRINGE | INTRAVENOUS | Status: DC | PRN
  Administered 2017-04-03: 50 mg via INTRAVENOUS

## 2017-04-03 MED ORDER — PANTOPRAZOLE SODIUM 40 MG PO TBEC
40.0000 mg | DELAYED_RELEASE_TABLET | Freq: Every day | ORAL | Status: DC
  Administered 2017-04-03: 40 mg via ORAL
  Filled 2017-04-03: qty 1

## 2017-04-03 MED ORDER — HYDROCODONE-ACETAMINOPHEN 5-325 MG PO TABS: 1.0000 | ORAL_TABLET | ORAL | Status: DC | PRN

## 2017-04-03 MED ORDER — ACETAMINOPHEN 325 MG PO TABS: 650.0000 mg | ORAL_TABLET | ORAL | Status: DC | PRN

## 2017-04-03 MED ORDER — DEXAMETHASONE SODIUM PHOSPHATE 10 MG/ML IJ SOLN
INTRAMUSCULAR | Status: DC | PRN
  Administered 2017-04-03: 10 mg via INTRAVENOUS

## 2017-04-03 MED ORDER — MENTHOL 3 MG MT LOZG
1.0000 | LOZENGE | OROMUCOSAL | Status: DC | PRN
  Filled 2017-04-03: qty 9

## 2017-04-03 MED ORDER — DOCUSATE SODIUM 100 MG PO CAPS
100.0000 mg | ORAL_CAPSULE | Freq: Two times a day (BID) | ORAL | Status: DC
  Administered 2017-04-03: 100 mg via ORAL
  Filled 2017-04-03: qty 1

## 2017-04-03 MED ORDER — MIDAZOLAM HCL 2 MG/2ML IJ SOLN
INTRAMUSCULAR | Status: AC
  Filled 2017-04-03: qty 2

## 2017-04-03 MED ORDER — CEFAZOLIN SODIUM-DEXTROSE 2-4 GM/100ML-% IV SOLN
2.0000 g | Freq: Three times a day (TID) | INTRAVENOUS | Status: AC
  Administered 2017-04-03 – 2017-04-04 (×2): 2 g via INTRAVENOUS
  Filled 2017-04-03 (×2): qty 100

## 2017-04-03 MED ORDER — LACTATED RINGERS IV SOLN
INTRAVENOUS | Status: DC
  Administered 2017-04-03: 10:00:00 via INTRAVENOUS

## 2017-04-03 MED ORDER — OXYCODONE HCL 5 MG/5ML PO SOLN: 5.0000 mg | Freq: Once | ORAL | Status: DC | PRN

## 2017-04-03 MED ORDER — ACETAMINOPHEN 500 MG PO TABS: 500.0000 mg | ORAL_TABLET | Freq: Every day | ORAL | Status: DC

## 2017-04-03 MED ORDER — SUGAMMADEX SODIUM 200 MG/2ML IV SOLN
INTRAVENOUS | Status: AC
  Filled 2017-04-03: qty 6

## 2017-04-03 SURGICAL SUPPLY — 78 items
ADH SKN CLS APL DERMABOND .7 (GAUZE/BANDAGES/DRESSINGS) ×1
BASKET BONE COLLECTION (BASKET) ×1 IMPLANT
BIT DRILL 14X2.5XNS TI ANT (BIT) IMPLANT
BIT DRILL AVIATOR 14 (BIT) ×2
BIT DRILL NEURO 2X3.1 SFT TUCH (MISCELLANEOUS) ×1 IMPLANT
BIT DRL 14X2.5XNS TI ANT (BIT) ×1
BLADE ULTRA TIP 2M (BLADE) ×1 IMPLANT
BNDG GAUZE ELAST 4 BULKY (GAUZE/BANDAGES/DRESSINGS) ×2 IMPLANT
BUR BARREL STRAIGHT FLUTE 4.0 (BURR) ×2 IMPLANT
CANISTER SUCT 3000ML PPV (MISCELLANEOUS) ×2 IMPLANT
CARTRIDGE OIL MAESTRO DRILL (MISCELLANEOUS) ×1 IMPLANT
COVER MAYO STAND STRL (DRAPES) ×2 IMPLANT
DECANTER SPIKE VIAL GLASS SM (MISCELLANEOUS) ×2 IMPLANT
DERMABOND ADVANCED (GAUZE/BANDAGES/DRESSINGS) ×1
DERMABOND ADVANCED .7 DNX12 (GAUZE/BANDAGES/DRESSINGS) ×1 IMPLANT
DIFFUSER DRILL AIR PNEUMATIC (MISCELLANEOUS) ×2 IMPLANT
DRAPE HALF SHEET 40X57 (DRAPES) ×1 IMPLANT
DRAPE INCISE IOBAN 66X45 STRL (DRAPES) ×1 IMPLANT
DRAPE LAPAROTOMY 100X72 PEDS (DRAPES) ×2 IMPLANT
DRAPE MICROSCOPE LEICA (MISCELLANEOUS) ×1 IMPLANT
DRAPE POUCH INSTRU U-SHP 10X18 (DRAPES) ×2 IMPLANT
DRILL NEURO 2X3.1 SOFT TOUCH (MISCELLANEOUS) ×2
DRSG OPSITE POSTOP 3X4 (GAUZE/BANDAGES/DRESSINGS) ×1 IMPLANT
DURAPREP 6ML APPLICATOR 50/CS (WOUND CARE) ×2 IMPLANT
ELECT COATED BLADE 2.86 ST (ELECTRODE) ×2 IMPLANT
ELECT REM PT RETURN 9FT ADLT (ELECTROSURGICAL) ×2
ELECTRODE REM PT RTRN 9FT ADLT (ELECTROSURGICAL) ×1 IMPLANT
GAUZE SPONGE 4X4 12PLY STRL (GAUZE/BANDAGES/DRESSINGS) IMPLANT
GAUZE SPONGE 4X4 16PLY XRAY LF (GAUZE/BANDAGES/DRESSINGS) IMPLANT
GLOVE BIO SURGEON STRL SZ8 (GLOVE) ×2 IMPLANT
GLOVE BIOGEL PI IND STRL 6.5 (GLOVE) IMPLANT
GLOVE BIOGEL PI IND STRL 7.5 (GLOVE) IMPLANT
GLOVE BIOGEL PI IND STRL 8 (GLOVE) ×1 IMPLANT
GLOVE BIOGEL PI IND STRL 8.5 (GLOVE) ×1 IMPLANT
GLOVE BIOGEL PI INDICATOR 6.5 (GLOVE) ×1
GLOVE BIOGEL PI INDICATOR 7.5 (GLOVE) ×1
GLOVE BIOGEL PI INDICATOR 8 (GLOVE) ×1
GLOVE BIOGEL PI INDICATOR 8.5 (GLOVE) ×1
GLOVE ECLIPSE 8.0 STRL XLNG CF (GLOVE) ×2 IMPLANT
GLOVE EXAM NITRILE LRG STRL (GLOVE) IMPLANT
GLOVE EXAM NITRILE XL STR (GLOVE) IMPLANT
GLOVE EXAM NITRILE XS STR PU (GLOVE) IMPLANT
GLOVE SURG SS PI 6.5 STRL IVOR (GLOVE) ×3 IMPLANT
GOWN STRL REUS W/ TWL LRG LVL3 (GOWN DISPOSABLE) IMPLANT
GOWN STRL REUS W/ TWL XL LVL3 (GOWN DISPOSABLE) ×1 IMPLANT
GOWN STRL REUS W/TWL 2XL LVL3 (GOWN DISPOSABLE) ×2 IMPLANT
GOWN STRL REUS W/TWL LRG LVL3 (GOWN DISPOSABLE) ×2
GOWN STRL REUS W/TWL XL LVL3 (GOWN DISPOSABLE) ×2
HALTER HD/CHIN CERV TRACTION D (MISCELLANEOUS) ×2 IMPLANT
HEMOSTAT POWDER KIT SURGIFOAM (HEMOSTASIS) ×1 IMPLANT
KIT BASIN OR (CUSTOM PROCEDURE TRAY) ×2 IMPLANT
KIT ROOM TURNOVER OR (KITS) ×2 IMPLANT
NDL HYPO 18GX1.5 BLUNT FILL (NEEDLE) ×1 IMPLANT
NDL HYPO 25X1 1.5 SAFETY (NEEDLE) ×1 IMPLANT
NDL SPNL 22GX3.5 QUINCKE BK (NEEDLE) ×1 IMPLANT
NEEDLE HYPO 18GX1.5 BLUNT FILL (NEEDLE) IMPLANT
NEEDLE HYPO 25X1 1.5 SAFETY (NEEDLE) ×2 IMPLANT
NEEDLE SPNL 22GX3.5 QUINCKE BK (NEEDLE) ×4 IMPLANT
NS IRRIG 1000ML POUR BTL (IV SOLUTION) ×2 IMPLANT
OIL CARTRIDGE MAESTRO DRILL (MISCELLANEOUS) ×2
PACK LAMINECTOMY NEURO (CUSTOM PROCEDURE TRAY) ×2 IMPLANT
PAD ARMBOARD 7.5X6 YLW CONV (MISCELLANEOUS) ×3 IMPLANT
PEEK SPACER AVS AS 6X14X16 4D (Cage) ×1 IMPLANT
PEEK SPACER AVS AS 7X14X16X4% (Peek) ×1 IMPLANT
PIN DISTRACTION 14MM (PIN) ×4 IMPLANT
PLATE AVIATOR ASSY 2LVL SZ 32 (Plate) ×1 IMPLANT
PUTTY DBX 1CC (Putty) ×2 IMPLANT
PUTTY DBX 1CC DEPUY (Putty) IMPLANT
RUBBERBAND STERILE (MISCELLANEOUS) ×3 IMPLANT
SCREW AVIATOR VAR SELFTAP 4X14 (Screw) ×6 IMPLANT
SPONGE INTESTINAL PEANUT (DISPOSABLE) ×2 IMPLANT
SPONGE SURGIFOAM ABS GEL SZ50 (HEMOSTASIS) ×1 IMPLANT
STAPLER SKIN PROX WIDE 3.9 (STAPLE) IMPLANT
SUT VIC AB 3-0 SH 8-18 (SUTURE) ×3 IMPLANT
SYR 3ML LL SCALE MARK (SYRINGE) ×1 IMPLANT
TOWEL GREEN STERILE (TOWEL DISPOSABLE) ×2 IMPLANT
TOWEL GREEN STERILE FF (TOWEL DISPOSABLE) ×2 IMPLANT
WATER STERILE IRR 1000ML POUR (IV SOLUTION) ×2 IMPLANT

## 2017-04-03 NOTE — Brief Op Note (Signed)
04/03/2017  2:05 PM  PATIENT:  Kyle Owen  76 y.o. male  PRE-OPERATIVE DIAGNOSIS:  Stenosis of cervical spine with myelopathy C 34, C 45 levels with bilateral upper extremity pain, weakness and numbness, Herniated cervical disc, cervicalgia, radiculopathy  POST-OPERATIVE DIAGNOSIS:  Stenosis of cervical spine with myelopathy C 34, C 45 levels with bilateral upper extremity pain, weakness and numbness, Herniated cervical disc, cervicalgia, radiculopathy  PROCEDURE:  Procedure(s): Cervical three-four, Cervical four-five Anterior cervical decompression/discectomy/fusion (N/A) with PEEK cages, autograft, anterior cervical plate  SURGEON:  Surgeon(s) and Role:    * Erline Levine, MD - Primary  PHYSICIAN ASSISTANT:   ASSISTANTS: [place], RN   ANESTHESIA:   general  EBL:  Total I/O In: 300 [I.V.:300] Out: 100 [Blood:100]  BLOOD ADMINISTERED:none  DRAINS: none   LOCAL MEDICATIONS USED:  MARCAINE    and LIDOCAINE   SPECIMEN:  No Specimen  DISPOSITION OF SPECIMEN:  N/A  COUNTS:  YES  TOURNIQUET:  * No tourniquets in log *  DICTATION: DICTATION: Patient is 76 year old male with cervical stenosis with myelopathy, bilateral upper extremity pain and weakness  C 34, C 45 levels  PROCEDURE: Patient was brought to operating room and following the smooth and uncomplicated induction of general endotracheal anesthesia his head was placed on a horseshoe head holder he was placed in 5 pounds of Holter traction and her anterior neck was prepped and draped in usual sterile fashion. An incision was made on the left side of midline after infiltrating the skin and subcutaneous tissues with local lidocaine. The platysmal layer was incised and subplatysmal dissection was performed exposing the anterior border sternocleidomastoid muscle. Using blunt dissection the carotid sheath was kept lateral and trachea and esophagus kept medial exposing the anterior cervical spine. A bent spinal needle was placed  it was felt to be the C 45 level and this was confirmed on intraoperative x-ray. Longus coli muscles were taken down from the anterior cervical spine using electrocautery and key elevator and self-retaining retractor was placed exposing the C 34 and C 45 levels. The interspaces were incised and a thorough discectomy was performed. Distraction pins were placed. Initially the C 34 level was operated. Uncinate spurs and central spondylitic ridges were drilled down with a high-speed drill. The spinal cord dura and both C 4 nerve roots were widely decompressed. Hemostasis was assured. After trial sizing a 6 mm peek interbody cage was selected and packed with local autograft. This was tamped into position and countersunk appropriately. Attention was the paid to the C 45 level, where similar decompression was performed.  Uncinate spurs and central spondylitic ridges were drilled down with a high-speed drill. The spinal cord dura and both C 5 nerve roots were widely decompressed. Hemostasis was assured. After trial sizing a 7 mm peek interbody cage was selected and packed in a similar fashion. This was tamped into position and countersunk appropriately.Distraction weight was removed. A 32 mm Aviator anterior cervical plate was affixed to the cervical spine with 14 mm variable-angle screws 2 at C3, 2 at C4 and 2 at C5. All screws were well-positioned and locking mechanisms were engaged. A final X ray was obtained which showed well positioned grafts and anterior plate without complicating features. Soft tissues were inspected and found to be in good repair. The wound was irrigated. The platysma layer was closed with 3-0 Vicryl stitches and the skin was reapproximated with 3-0 Vicryl subcuticular stitches. The wound was dressed with Dermabond. Counts were correct at the  end of the case. Patient was extubated and taken to recovery in stable and satisfactory condition.    PLAN OF CARE: Admit to inpatient   PATIENT  DISPOSITION:  PACU - hemodynamically stable.   Delay start of Pharmacological VTE agent (>24hrs) due to surgical blood loss or risk of bleeding: yes

## 2017-04-03 NOTE — Interval H&P Note (Signed)
History and Physical Interval Note:  04/03/2017 11:32 AM  Kyle Owen  has presented today for surgery, with the diagnosis of Stenosis of cervical spine with myelopathy  The various methods of treatment have been discussed with the patient and family. After consideration of risks, benefits and other options for treatment, the patient has consented to  Procedure(s) with comments: C3-4 C4-5 Anterior cervical decompression/discectomy/fusion (N/A) - C3-4 C4-5 Anterior cervical decompression/discectomy/fusion as a surgical intervention .  The patient's history has been reviewed, patient examined, no change in status, stable for surgery.  I have reviewed the patient's chart and labs.  Questions were answered to the patient's satisfaction.     Chizaram Latino D

## 2017-04-03 NOTE — Anesthesia Preprocedure Evaluation (Signed)
Anesthesia Evaluation  Patient identified by MRN, date of birth, ID band Patient awake    Reviewed: Allergy & Precautions, NPO status , [name] Chart, lab work & pertinent test results  History of Anesthesia Complications Negative for: history of anesthetic complications  Airway Mallampati: II  TM Distance: >3 FB Neck ROM: Limited    Dental  (+) Teeth Intact   Pulmonary shortness of breath, COPD,  COPD inhaler, former smoker,    breath sounds clear to auscultation       Cardiovascular + Peripheral Vascular Disease  + dysrhythmias Atrial Fibrillation  Rhythm:Regular     Neuro/Psych  Headaches, PSYCHIATRIC DISORDERS Depression    GI/Hepatic negative GI ROS, Neg liver ROS,   Endo/Other  negative endocrine ROS  Renal/GU Renal disease     Musculoskeletal  (+) Arthritis ,   Abdominal   Peds  Hematology negative hematology ROS (+)   Anesthesia Other Findings   Reproductive/Obstetrics                             Anesthesia Physical Anesthesia Plan  ASA: II  Anesthesia Plan: General   Post-op Pain Management:    Induction: Intravenous  Airway Management Planned: Video Laryngoscope Planned and Oral ETT  Additional Equipment: None  Intra-op Plan:   Post-operative Plan: Extubation in OR  Informed Consent: I have reviewed the patients History and Physical, chart, labs and discussed the procedure including the risks, benefits and alternatives for the proposed anesthesia with the patient or authorized representative who has indicated his/her understanding and acceptance.   Dental advisory given  Plan Discussed with: CRNA and Surgeon  Anesthesia Plan Comments:         Anesthesia Quick Evaluation

## 2017-04-03 NOTE — Anesthesia Procedure Notes (Signed)
Procedure Name: Intubation Date/Time: 04/03/2017 12:11 PM Performed by: Merdis Delay Pre-anesthesia Checklist: Patient identified, Emergency Drugs available, Suction available, Patient being monitored and Timeout performed Patient Re-evaluated:Patient Re-evaluated prior to inductionOxygen Delivery Method: Circle system utilized Preoxygenation: Pre-oxygenation with 100% oxygen Intubation Type: IV induction Ventilation: Mask ventilation without difficulty Laryngoscope Size: Glidescope and 4 Grade View: Grade I Tube type: Oral Tube size: 7.5 mm Number of attempts: 1 Airway Equipment and Method: Stylet and Video-laryngoscopy Placement Confirmation: ETT inserted through vocal cords under direct vision,  positive ETCO2 and breath sounds checked- equal and bilateral Secured at: 22 cm Tube secured with: Tape Dental Injury: Teeth and Oropharynx as per pre-operative assessment

## 2017-04-03 NOTE — Evaluation (Signed)
Physical Therapy Evaluation Patient Details Name: Kyle Owen MRN: [id number] DOB: 14-Jan-1941 Today's Date: 04/03/2017   History of Present Illness  Pt is 76 y/o male s/p C3-5 ACDF. PMH includes COPD, PVD, a fib, depression, renal disease, and hx of back surgery.   Clinical Impression  Patient is s/p above surgery resulting in the deficits listed below (see PT Problem List). PTA, pt was independent with cane, however, had history of falls. Upon evaluation, pt presenting with decreased steadiness and weakness. Pt requiring from min to min guard assist for mobility secondary to deficits below. Pt educated to use RW at home to increase stability and safety, but will need follow up education. Recommending HHPT at d/c to increase functional mobility independence and safety.  Patient will benefit from skilled PT to increase their independence and safety with mobility (while adhering to their precautions) to allow discharge to the venue listed below. Will continue to follow      Follow Up Recommendations Home health PT;Supervision/Assistance - 24 hour    Equipment Recommendations  None recommended by PT    Recommendations for Other Services       Precautions / Restrictions Precautions Precautions: Cervical Precaution Comments: Reviewed cervical precautions and administered handout.  Required Braces or Orthoses: Cervical Brace Cervical Brace: Hard collar Restrictions Weight Bearing Restrictions: No      Mobility  Bed Mobility Overal bed mobility: Needs Assistance Bed Mobility: Rolling;Sidelying to Sit;Sit to Sidelying Rolling: Min assist Sidelying to sit: Min assist;HOB elevated     Sit to sidelying: Mod assist General bed mobility comments: Min A for trunk elevation with HOB elevated. Verbal cues for appropriate log roll technique. Mod A for LE lifting during sit to sidelying transfer.   Transfers Overall transfer level: Needs assistance Equipment used: Rolling walker (2  wheeled) Transfers: Sit to/from Stand Sit to Stand: Min assist;From elevated surface         General transfer comment: Min A for lift assist secondary to BLE weakness.   Ambulation/Gait Ambulation/Gait assistance: Min guard;Min assist Ambulation Distance (Feet): 200 Feet Assistive device: Rolling walker (2 wheeled) Gait Pattern/deviations: Step-through pattern;Decreased stride length;Trunk flexed Gait velocity: Decreased Gait velocity interpretation: Below normal speed for age/gender General Gait Details: Verbal cues for upright posture throughout and for appropriate proximity to RW. Required safety cues to keep BUE on RW at all times. Pt had tendency to get distracted and would take one hand off RW and required min A to prevent LOB. Verbal cues for safe gait speed to increase stability. Educated to use RW at home; will need follow up education about using RW.   Stairs            Wheelchair Mobility    Modified Rankin (Stroke Patients Only)       Balance Overall balance assessment: Needs assistance Sitting-balance support: No upper extremity supported;Feet supported Sitting balance-Leahy Scale: Good     Standing balance support: Bilateral upper extremity supported;During functional activity Standing balance-Leahy Scale: Poor Standing balance comment: Reliant on RW                              Pertinent Vitals/Pain Pain Assessment: Faces Faces Pain Scale: Hurts little more Pain Location: neck  Pain Descriptors / Indicators: Operative site guarding;Sore Pain Intervention(s): Limited activity within patient's tolerance;Monitored during session;Repositioned    Home Living Family/patient expects to be discharged to:: Private residence Living Arrangements: Spouse/significant other Available Help at Discharge: Family;Available 24  hours/day Type of Home: House Home Access: Stairs to enter Entrance Stairs-Rails: None Entrance Stairs-Number of Steps: 3 Home  Layout: Two level;Able to live on main level with bedroom/bathroom Home Equipment: Gilford Rile - 2 wheels;Cane - single point;Grab bars - tub/shower;Bedside commode;Wheelchair - manual      Prior Function Level of Independence: Independent with assistive device(s)         Comments: Used cane at baseline, but reported some falls. Reports one was because he was on the floor doing exercise and fell forward.      Hand Dominance   Dominant Hand: Right    Extremity/Trunk Assessment   Upper Extremity Assessment Upper Extremity Assessment: LUE deficits/detail LUE Deficits / Details: Reports numbness in LUE fingers which has gotten better since surgery     Lower Extremity Assessment Lower Extremity Assessment: Generalized weakness (functional weakness, at least 3+/5 throughout)    Cervical / Trunk Assessment Cervical / Trunk Assessment: Other exceptions Cervical / Trunk Exceptions: s/p surgery   Communication   Communication: No difficulties  Cognition Arousal/Alertness: Awake/alert Behavior During Therapy: WFL for tasks assessed/performed Overall Cognitive Status: Within Functional Limits for tasks assessed                                        General Comments General comments (skin integrity, edema, etc.): Educated pt and pt's wife and daughter about d/c recommendations for HHPT. Pt requiring continuous education about use of RW at home to increase safety with mobility; will need follow up education.     Exercises     Assessment/Plan    PT Assessment Patient needs continued PT services  PT Problem List Decreased strength;Decreased activity tolerance;Decreased balance;Decreased mobility;Decreased safety awareness;Decreased knowledge of use of DME;Decreased knowledge of precautions;Impaired sensation;Pain       PT Treatment Interventions DME instruction;Gait training;Stair training;Functional mobility training;Therapeutic activities;Balance training;Therapeutic  exercise;Neuromuscular re-education;Patient/family education    PT Goals (Current goals can be found in the Care Plan section)  Acute Rehab PT Goals Patient Stated Goal: to get back to using my cane  PT Goal Formulation: With patient Time For Goal Achievement: 04/10/17 Potential to Achieve Goals: Good    Frequency Min 5X/week   Barriers to discharge        Co-evaluation               End of Session Equipment Utilized During Treatment: Gait belt;Cervical collar Activity Tolerance: Patient tolerated treatment well Patient left: in bed;with call bell/phone within reach;with family/visitor present Nurse Communication: Mobility status PT Visit Diagnosis: Repeated falls (R29.6);Other abnormalities of gait and mobility (R26.89);Pain Pain - part of body:  (neck )    Time: 2706-2376 PT Time Calculation (min) (ACUTE ONLY): 34 min   Charges:   PT Evaluation $PT Eval Low Complexity: 1 Procedure PT Treatments $Gait Training: 8-22 mins   PT G Codes:        Nicky Pugh, PT, DPT  Acute Rehabilitation Services  Pager: [phone]   Army Melia 04/03/2017, 5:19 PM

## 2017-04-03 NOTE — H&P (Signed)
Patient ID:   [id number] Patient: Kyle Owen  Date of Birth: 1941-11-14 Visit Type: Office Visit   Date: 03/19/2017 01:00 PM Provider: Marchia Meiers. Vertell Limber MD   This 76 year old male presents for neck pain.  History of Present Illness: 1.  neck pain    Kyle Owen returns for evaluation of left shoulder pain, left greater than right arm numbness and weakness.  Last seen in 2011, patient notes the symptoms began after he fell asleep on the couch last month.  He is no longer able to participate in gym activities due to his weakness.  Tylenol 500 mg 1 per day Prednisone taper in progress  Eliquis 5 mg b.i.d.Marland Kitchen..  AFib  History:  Arthritis, depression, COPD, bladder cancer, thyroid nodule, AFib, multiple TIA Surgical history:  Left elbow 2010, left shoulder 2000, right inguinal hernia repair 1990, lumbar fusion by Dr. Carloyn Manner 2015, knee scope 2013, partial thyroidectomy 2016, bladder surgery 2017, loop recorder 2016  MRI and x-rays on [place]       PAST MEDICAL HISTORY, SURGICAL HISTORY, FAMILY HISTORY, SOCIAL HISTORY AND REVIEW OF SYSTEMS I have reviewed the patient's past medical, surgical, family and social history as well as the comprehensive review of systems as included on the [facility] history form dated 03/19/2017, which I have signed.   MEDICATIONS(added, continued or stopped this visit): Started Medication Directions Instruction Stopped   finasteride 5 mg tablet take 1 tablet by oral route  every day     sertraline 50 mg tablet take 1 tablet by oral route  every day     Symbicort 160 mcg-4.5 mcg/actuation HFA aerosol inhaler inhale 2 puff by inhalation route 2 times every day in the morning and evening       ALLERGIES:    Vitals Date Temp F BP Pulse Ht In Wt Lb BMI BSA Pain Score  03/19/2017  146/82 76 73 216 28.5  5/10     PHYSICAL EXAM General Level of Distress: no acute distress Overall Appearance: normal  Head and  Face  Right Left  Fundoscopic Exam:  normal normal    Cardiovascular Cardiac: regular rate and rhythm without murmur  Right Left  Carotid Pulses: normal normal  Respiratory Lungs: clear to auscultation  Neurological Orientation: normal Recent and Remote Memory: normal Attention Span and Concentration:   normal Language: normal Fund of Knowledge: normal  Right Left Sensation: normal normal Upper Extremity Coordination: normal normal  Lower Extremity Coordination: normal normal  Musculoskeletal Gait and Station: normal  Right Left Upper Extremity Muscle Strength: normal normal Lower Extremity Muscle Strength: normal normal Upper Extremity Muscle Tone:  normal normal Lower Extremity Muscle Tone: normal normal   Motor Strength Upper and lower extremity motor strength was tested in the clinically pertinent muscles. Any abnormal findings will be noted below.   Right Left Deltoid: 4/5 4-/5   Deep Tendon Reflexes  Right Left Biceps: normal normal Triceps: normal normal Brachioradialis: normal normal Patellar: normal normal Achilles: normal normal  Sensory Sensation was tested at C2 to T1.   Cranial Nerves II. Optic Nerve/Visual Fields: normal III. Oculomotor: normal IV. Trochlear: normal V. Trigeminal: normal VI. Abducens: normal VII. Facial: normal VIII. Acoustic/Vestibular: normal IX. Glossopharyngeal: normal X. Vagus: normal XI. Spinal Accessory: normal XII. 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     IMPRESSION The patient comes in for evaluation of cervical discomfort, left shoulder pain, and left>right arm numbness.  He is unable to lift his left arm above his head and notes significant left>right UE weakness and he reports some numbness in his fingers bilaterally.  He notes these symptoms began 2 weeks ago after napping on the couch.  He is using a cane to walk and reports mild muscular spasms in his left shoulder and some balance issues.  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  On confrontational testing, he has positive Spurling's on the left, 4/5 right deltoid, 4-/5 left deltoid, bilateral interosseous atrophy in first digit, and decreased sensation to pinprick in left shoulder.  X-rays reveal 6.3 mm spinal canal at C3-4 due to stenosis and spondylolisthesis at C4-5 flexion: 3 mm and extension: 2 mm, cervical spondylosis.  Due to the level of his stenosis and weakness I would like to schedule an urgent ACDF C3-4, C4-5.  He will need to come off eliquis 3 days prior to surgery.    Patient is on an anti-coagulant, anti-inflammatory or supplement that may increase bleeding time. Patient advised to stop medicine prior to surgery.  Comments:  Patient will take last dose of Eliquis April 6 for April 10th surgery  Completed Orders (this encounter) Order Details Reason Side Interpretation Result Initial Treatment Date Region  Cervical Spine- AP/Lat/Flex/Ex      03/19/2017 All Levels to All Levels   Assessment/Plan # Detail Type Description   1. Assessment Cervicalgia (M54.2).       2. 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       3. Assessment Lumbar radiculopathy (M54.16).       4. Assessment Cervical radiculopathy (M54.12).       5. 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   Plan Orders Vista Hard Collar Universal Se.         Pain Assessment/Treatment Pain Scale: 5/10. Method: Numeric Pain Intensity Scale. Location: neck and shoulder. Onset: 03/08/2017. Duration: varies. Quality: aching, dull. Pain Assessment/Treatment follow-up plan of care: Patient is not using any methods to relieve pain..  Fall Risk Plan The patient has not fallen in the last year.  Nurse education given.  Fit for vista collar.  Scheduled URGENT ACDF C3-4, C4-5.  Orders: Diagnostic Procedures: Assessment Procedure  M54.12 Cervical Spine- AP/Lat/Flex/Ex  M54.12 Cervical Spine- Lateral  Miscellaneous: Assessment   M47.12 Vista Hard Collar Universal Se             Provider:  Marchia Meiers. Vertell Limber MD  03/21/2017 02:49 PM Dictation edited by: Daine Gravel    CC Providers: Asencion Noble III [address] [place], [place] [id number]-              Electronically signed by Marchia Meiers. Vertell Limber MD on 03/23/2017 05:26 PM

## 2017-04-03 NOTE — Progress Notes (Signed)
Carelink Summary Report / Loop Recorder 

## 2017-04-03 NOTE — Op Note (Signed)
04/03/2017  2:05 PM  PATIENT:  Kyle Owen  76 y.o. male  PRE-OPERATIVE DIAGNOSIS:  Stenosis of cervical spine with myelopathy C 34, C 45 levels with bilateral upper extremity pain, weakness and numbness, Herniated cervical disc, cervicalgia, radiculopathy  POST-OPERATIVE DIAGNOSIS:  Stenosis of cervical spine with myelopathy C 34, C 45 levels with bilateral upper extremity pain, weakness and numbness, Herniated cervical disc, cervicalgia, radiculopathy  PROCEDURE:  Procedure(s): Cervical three-four, Cervical four-five Anterior cervical decompression/discectomy/fusion (N/A) with PEEK cages, autograft, anterior cervical plate  SURGEON:  Surgeon(s) and Role:    * Erline Levine, MD - Primary  PHYSICIAN ASSISTANT:   ASSISTANTS: P[place]at, RN   ANESTHESIA:   general  EBL:  Total I/O In: 300 [I.V.:300] Out: 100 [Blood:100]  BLOOD ADMINISTERED:none  DRAINS: none   LOCAL MEDICATIONS USED:  MARCAINE    and LIDOCAINE   SPECIMEN:  No Specimen  DISPOSITION OF SPECIMEN:  N/A  COUNTS:  YES  TOURNIQUET:  * No tourniquets in log *  DICTATION: DICTATION: Patient is 76 year old male with cervical stenosis with myelopathy, bilateral upper extremity pain and weakness  C 34, C 45 levels  PROCEDURE: Patient was brought to operating room and following the smooth and uncomplicated induction of general endotracheal anesthesia his head was placed on a horseshoe head holder he was placed in 5 pounds of Holter traction and her anterior neck was prepped and draped in usual sterile fashion. An incision was made on the left side of midline after infiltrating the skin and subcutaneous tissues with local lidocaine. The platysmal layer was incised and subplatysmal dissection was performed exposing the anterior border sternocleidomastoid muscle. Using blunt dissection the carotid sheath was kept lateral and trachea and esophagus kept medial exposing the anterior cervical spine. A bent spinal needle was placed  it was felt to be the C 45 level and this was confirmed on intraoperative x-ray. Longus coli muscles were taken down from the anterior cervical spine using electrocautery and key elevator and self-retaining retractor was placed exposing the C 34 and C 45 levels. The interspaces were incised and a thorough discectomy was performed. Distraction pins were placed. Initially the C 34 level was operated. Uncinate spurs and central spondylitic ridges were drilled down with a high-speed drill. The spinal cord dura and both C 4 nerve roots were widely decompressed. Hemostasis was assured. After trial sizing a 6 mm peek interbody cage was selected and packed with local autograft. This was tamped into position and countersunk appropriately. Attention was the paid to the C 45 level, where similar decompression was performed.  Uncinate spurs and central spondylitic ridges were drilled down with a high-speed drill. The spinal cord dura and both C 5 nerve roots were widely decompressed. Hemostasis was assured. After trial sizing a 7 mm peek interbody cage was selected and packed in a similar fashion. This was tamped into position and countersunk appropriately.Distraction weight was removed. A 32 mm Aviator anterior cervical plate was affixed to the cervical spine with 14 mm variable-angle screws 2 at C3, 2 at C4 and 2 at C5. All screws were well-positioned and locking mechanisms were engaged. A final X ray was obtained which showed well positioned grafts and anterior plate without complicating features. Soft tissues were inspected and found to be in good repair. The wound was irrigated. The platysma layer was closed with 3-0 Vicryl stitches and the skin was reapproximated with 3-0 Vicryl subcuticular stitches. The wound was dressed with Dermabond. Counts were correct at the  end of the case. Patient was extubated and taken to recovery in stable and satisfactory condition.    PLAN OF CARE: Admit to inpatient   PATIENT  DISPOSITION:  PACU - hemodynamically stable.   Delay start of Pharmacological VTE agent (>24hrs) due to surgical blood loss or risk of bleeding: yes

## 2017-04-03 NOTE — Transfer of Care (Signed)
Immediate Anesthesia Transfer of Care Note  Patient: Kyle Owen  Procedure(s) Performed: Procedure(s): Cervical three-four, Cervical four-five Anterior cervical decompression/discectomy/fusion (N/A)  Patient Location: PACU  Anesthesia Type:General  Level of Consciousness: alert  and confused  Airway & Oxygen Therapy: Patient Spontanous Breathing and Patient connected to nasal cannula oxygen  Post-op Assessment: Report given to RN and Post -op Vital signs reviewed and stable  Post vital signs: Reviewed and stable  Last Vitals:  Vitals:   04/03/17 0927  BP: 123/69  Pulse: 66  Resp: 20  Temp: 36.4 C    Last Pain:  Vitals:   04/03/17 0955  TempSrc:   PainSc: 5       Patients Stated Pain Goal: 5 (02/33/43 5686)  Complications: No apparent anesthesia complications

## 2017-04-04 ENCOUNTER — Encounter (HOSPITAL_COMMUNITY): Payer: Self-pay | Admitting: Neurosurgery

## 2017-04-04 DIAGNOSIS — C679 Malignant neoplasm of bladder, unspecified: Secondary | ICD-10-CM | POA: Diagnosis not present

## 2017-04-04 DIAGNOSIS — Z7901 Long term (current) use of anticoagulants: Secondary | ICD-10-CM | POA: Diagnosis not present

## 2017-04-04 DIAGNOSIS — M4712 Other spondylosis with myelopathy, cervical region: Secondary | ICD-10-CM | POA: Diagnosis not present

## 2017-04-04 DIAGNOSIS — I4891 Unspecified atrial fibrillation: Secondary | ICD-10-CM | POA: Diagnosis not present

## 2017-04-04 DIAGNOSIS — J449 Chronic obstructive pulmonary disease, unspecified: Secondary | ICD-10-CM | POA: Diagnosis not present

## 2017-04-04 DIAGNOSIS — Z8673 Personal history of transient ischemic attack (TIA), and cerebral infarction without residual deficits: Secondary | ICD-10-CM | POA: Diagnosis not present

## 2017-04-04 MED ORDER — METHOCARBAMOL 500 MG PO TABS: 500.0000 mg | ORAL_TABLET | Freq: Four times a day (QID) | ORAL | 1 refills | Status: DC | PRN

## 2017-04-04 MED ORDER — HYDROCODONE-ACETAMINOPHEN 5-325 MG PO TABS: 1.0000 | ORAL_TABLET | ORAL | 0 refills | Status: DC | PRN

## 2017-04-04 NOTE — Evaluation (Signed)
Occupational Therapy Evaluation Patient Details Name: Kyle Owen MRN: [id number] DOB: 1941/05/17 Today's Date: 04/04/2017    History of Present Illness Pt is 76 y/o male s/p C3-5 ACDF. PMH includes COPD, PVD, a fib, depression, renal disease, and hx of back surgery.    Clinical Impression   Patient evaluated by Occupational Therapy with no further acute OT needs identified. All education has been completed and the patient has no further questions. See below for any follow-up Occupational Therapy or equipment needs. OT to sign off. Thank you for referral.      Follow Up Recommendations  No OT follow up    Equipment Recommendations  None recommended by OT    Recommendations for Other Services       Precautions / Restrictions Precautions Precautions: Cervical Precaution Comments: handout provided and educated on cervical precautions with adls. Required Braces or Orthoses: Cervical Brace Cervical Brace: Hard collar Restrictions Weight Bearing Restrictions: No      Mobility Bed Mobility               General bed mobility comments: sitting EOB on arrival  Transfers Overall transfer level: Needs assistance Equipment used: Rolling walker (2 wheeled) Transfers: Sit to/from Stand Sit to Stand: Min guard         General transfer comment: cues for hand placement and to control descend    Balance Overall balance assessment: Needs assistance Sitting-balance support: Feet supported;Bilateral upper extremity supported Sitting balance-Leahy Scale: Poor Sitting balance - Comments: Pt with both arms across tray table at [facility] start of session.  When tray table was removed and pt instructed to dawn gown for backside, he leaned to the R, indicating possible LOB.   Standing balance support: Bilateral upper extremity supported Standing balance-Leahy Scale: Fair Standing balance comment: relies on RW for safety                            ADL either performed  or assessed with clinical judgement   ADL Overall ADL's : Needs assistance/impaired Eating/Feeding: Independent   Grooming: Wash/dry hands;Independent                   Toilet Transfer: Min guard       Scientist, research (medical): Medical sales representative Details (indicate cue type and reason): educated tub transfer and safety with tranfer. wife present and educated on her (A) and positioning. Pt has a 3n1 that can be used for shower seat to prevent fall Functional mobility during ADLs: Min guard;Rolling walker General ADL Comments: Pt don doff ccollar with educated with wife present . Pt and wife without further questions at this time   Pt educated on bathing and avoid washing directly on incision. Pt educated to use new wash cloth and towel each day. Pt educated to allow water to run across dressing and not to soak in a tub at this time. Pt advised RN will instruct on any bandages required otherwise is open to air. Cervical precautions ( handout provided): Educated patient on don doff brace with return demonstration, educated on oral care using cups, washing face with cloth, never to wash directly on incision site, avoid neck rotation flexion and extension, positioning with pillows in chair for bil UE, sleeping positioning, avoiding pushing / pulling with bil UE, and fine motor exercises ( handout provided). Instructed on wall slides FF 90 degrees and table top slides.  Pt educated on need to notify doctor /  RN of swallowing changes or choking..       Vision Baseline Vision/History: Wears glasses Wears Glasses: At all times       Perception     Praxis      Pertinent Vitals/Pain Pain Assessment: Faces Pain Score: 4  Faces Pain Scale: Hurts little more Pain Location: Neck and L shoulder Pain Descriptors / Indicators: Operative site guarding;Sore;Tingling Pain Intervention(s): Limited activity within patient's tolerance;Repositioned;Premedicated before  session;Monitored during session     Hand Dominance Right   Extremity/Trunk Assessment Upper Extremity Assessment Upper Extremity Assessment: LUE deficits/detail LUE Deficits / Details: AROM 45 degrees and using R UE to assist to 90 degrees. Educated on 25 degree max movement at this time with cervical precautions LUE Coordination: decreased gross motor   Lower Extremity Assessment Lower Extremity Assessment: Defer to PT evaluation   Cervical / Trunk Assessment Cervical / Trunk Assessment: Other exceptions Cervical / Trunk Exceptions: s/p surgery    Communication Communication Communication: No difficulties   Cognition Arousal/Alertness: Awake/alert Behavior During Therapy: WFL for tasks assessed/performed Overall Cognitive Status: Within Functional Limits for tasks assessed Area of Impairment: Attention;Memory;Safety/judgement                   Current Attention Level: Divided Memory: Decreased short-term memory   Safety/Judgement: Decreased awareness of safety;Decreased awareness of deficits     General Comments: pt reports disoriented over night and being "loopy" pt appears to follow all commands and recall information this session. pt advised on use of RW compared to cane use   General Comments  Pt required continuous education on RW use and safety awareness.     Exercises     Shoulder Instructions      Home Living Family/patient expects to be discharged to:: Private residence Living Arrangements: Spouse/significant other Available Help at Discharge: Family Type of Home: House Home Access: Stairs to enter [employer] of Steps: 3 Entrance Stairs-Rails: None Home Layout: Two level;Able to live on main level with bedroom/bathroom     Bathroom Shower/Tub: Teacher, early years/pre: Standard     Home Equipment: Environmental consultant - 2 wheels;Cane - single point;Grab bars - tub/shower;Wheelchair - manual;Bedside commode   Additional Comments: pt  has all neccessary DME from first wife      Prior Functioning/Environment Level of Independence: Independent with assistive device(s)        Comments: Used cane at baseline, but reported some falls. Reports one was because he was on the floor doing exercise and fell forward.  (keeps a cane in each car)        OT Problem List:        OT Treatment/Interventions:      OT Goals(Current goals can be found in the care plan section) Acute Rehab OT Goals Patient Stated Goal: to get back to using my cane  OT Goal Formulation: With patient  OT Frequency:     Barriers to D/C:            Co-evaluation              End of Session Equipment Utilized During Treatment: Rolling walker;Cervical collar Nurse Communication: Mobility status;Precautions  Activity Tolerance: Patient tolerated treatment well Patient left: in bed;with call bell/phone within reach;with family/visitor present (sitting EOB)  OT Visit Diagnosis: Unsteadiness on feet (R26.81)                Time: 3614-4315 OT Time Calculation (min): 27 min Charges:  OT General Charges $OT Visit:  1 Procedure OT Evaluation $OT Eval Moderate Complexity: 1 Procedure OT Treatments $Self Care/Home Management : 8-22 mins G-Codes:      Jeri Modena   OTR/L Pager: [phone] Office: [phone] .   Parke Poisson B 04/04/2017, 10:16 AM

## 2017-04-04 NOTE — Anesthesia Postprocedure Evaluation (Addendum)
Anesthesia Post Note  Patient: Kyle Owen  Procedure(s) Performed: Procedure(s) (LRB): Cervical three-four, Cervical four-five Anterior cervical decompression/discectomy/fusion (N/A)  Patient location during evaluation: PACU Anesthesia Type: General Level of consciousness: awake and alert Pain management: pain level controlled Vital Signs Assessment: post-procedure vital signs reviewed and stable Respiratory status: spontaneous breathing, nonlabored ventilation, respiratory function stable and patient connected to nasal cannula oxygen Cardiovascular status: blood pressure returned to baseline and stable Postop Assessment: no signs of nausea or vomiting Anesthetic complications: no       Last Vitals:  Vitals:   04/04/17 0347 04/04/17 0900  BP: 111/68 120/65  Pulse: 72 91  Resp: 18 18  Temp: 37.2 C 37.2 C    Last Pain:  Vitals:   04/04/17 0900  TempSrc: Oral  PainSc:                  [name]

## 2017-04-04 NOTE — Care Management Note (Signed)
Case Management Note  Patient Details  Name: Kyle Owen MRN: [id number] Date of Birth: 1941/09/08  Subjective/Objective:  76 yr old gentleman s/p C3-5 ACDF.                   Action/Plan:  Patient was discharged prior to CM seeing Him,. CM called patient at home to discuss home health needs. RW had been provided prior to patient discharging. Referral was called to Stevie Kern, [place]. Patient was appreciative of phone call.     Expected Discharge Date:  04/04/17               Expected Discharge Plan:   Home with Home Health  In-House Referral:     Discharge planning Services  CM Consult  Post Acute Care Choice:    Choice offered to:  Patient  DME Arranged:  Walker rolling DME Agency:  [place]:  PT [facility] Agency:  [place]  Status of Service:  Completed, signed off  If discussed at [place] of Stay Meetings, dates discussed:    Additional Comments:  Ninfa Meeker, RN 04/04/2017, 2:18 PM

## 2017-04-04 NOTE — Progress Notes (Addendum)
Subjective: Patient reports "I still have a little tingling in this hand(left), but I'm not hurting like I was"  Objective: Vital signs in last 24 hours: Temp:  [97.5 F (36.4 C)-98.9 F (37.2 C)] 98.9 F (37.2 C) (04/11 0347) Pulse Rate:  [66-87] 72 (04/11 0347) Resp:  [14-22] 18 (04/11 0347) BP: (111-142)/(62-117) 111/68 (04/11 0347) SpO2:  [89 %-98 %] 97 % (04/11 0347)  Intake/Output from previous day: 04/10 0701 - 04/11 0700 In: 540 [P.O.:240; I.V.:300] Out: 100 [Blood:100] Intake/Output this shift: No intake/output data recorded.  Alert, conversant, sitting at EOB. Wife present. Good strength BUE including hand intrinsics. Ambulating this morning. Incision flat without erythema, swelling, or drainage benath Dermabond & honeycomb.   Lab Results: No results for input(s): WBC, HGB, HCT, PLT in the last 72 hours. BMET No results for input(s): NA, K, CL, CO2, GLUCOSE, BUN, CREATININE, CALCIUM in the last 72 hours.  Studies/Results: Dg Cervical Spine 2-3 Views  Result Date: 04/03/2017 CLINICAL DATA:  ACDF at C3-C4, C4-C5 EXAM: CERVICAL SPINE - 2-3 VIEW COMPARISON:  3/26/ 18 FINDINGS: Two lateral views of the cervical spine submitted. First view there is anterior localization needle at C4-C5 disc space. Second view there is anterior fusion and metallic plate and screws at C3, C4 and C5 level. There is anatomic alignment. Partially visualized endotracheal tube. IMPRESSION: Anterior fusion with metallic plate and screws at C3, C4 and C5 level. There is anatomic alignment. Please see the operative report Electronically Signed   By: Lahoma Crocker M.D.   On: 04/03/2017 14:12    Assessment/Plan: Improving  LOS: 1 day  Per DrStern, d/c to home. HHPT. Pt verbalizes understanding of d/c instructions. He may resume Eliquis in 5 days. Rx for Norco & Robaxin will be sent with pt for prn use. Pt will call office tio schedule 3-4 wk f/u appt.    Kyle Owen Owen 04/04/2017, 8:04 AM   Strength in  both arms and hands significantly better.  Still some pain and weakness in left shoulder girdle/deltoid, although this is also improved.

## 2017-04-04 NOTE — Progress Notes (Signed)
Patient is discharged from room 3C07 at this time. Alert and in stable condition. Instructions given to patient and wife with understanding verbalized. Left unit via wheelchair with all belongings at side.

## 2017-04-04 NOTE — Progress Notes (Signed)
Physical Therapy Treatment Note   PT Assessment: Pt is still impulsive and needs reinforcement with RW use, safety awareness, and precaution management. This session he was reeducated on precautions and safety.    He ambulated and completed stair training today requiring min guard assist, with min assist for RW management at times.  PTA, he was independent at home with wife.  Current recommendations remain appropriate as HHPT will assist to maximize his return to PLOF.    04/04/17 0901  PT Visit Information  Last PT Received On 04/04/17  Assistance Needed +1  History of Present Illness Pt is 76 y/o male s/p C3-5 ACDF. PMH includes COPD, PVD, a fib, depression, renal disease, and hx of back surgery.   Subjective Data  Subjective "I usually just fall down" (into chair) Pt states he is still under the influence of medications  Patient Stated Goal to get back to using my cane   Precautions  Precautions Cervical  Precaution Comments Precautions reviewed.  Pt recalled 3/3 precautions when prompted with BLT.  Required Braces or Orthoses Cervical Brace  Cervical Brace Hard collar  Restrictions  Weight Bearing Restrictions No  Pain Assessment  Pain Assessment Faces  Faces Pain Scale 4  Pain Location Neck and L shoulder  Pain Descriptors / Indicators Operative site guarding;Sore;Tingling (L arm tingling but no numbness)  Pain Intervention(s) Monitored during session;Repositioned  Cognition  Arousal/Alertness Awake/alert;Suspect due to medications  Behavior During Therapy Impulsive  Overall Cognitive Status Impaired/Different from baseline  Area of Impairment Attention;Memory;Safety/judgement  Current Attention Level Divided  Memory Decreased short-term memory  Safety/Judgement Decreased awareness of safety;Decreased awareness of deficits  General Comments Pt and wife states he was very disoriented last night and thought he was standing on the wall.   Bed Mobility  General bed mobility  comments Pt sitting at EOB at start of session  Transfers  Overall transfer level Needs assistance  Equipment used Rolling walker (2 wheeled)  Transfers Sit to/from Stand  Sit to Stand Min guard  General transfer comment VC's required for hand placement and precaution management/keep head up and body erect.  Ambulation/Gait  Ambulation/Gait assistance Min guard;Min assist  Ambulation Distance (Feet) 200 Feet  Assistive device Rolling walker (2 wheeled)  Gait Pattern/deviations Step-through pattern;Trunk flexed;Narrow base of support  General Gait Details Pt consistently needs cueing to keep both hands on RW and upright posture.  Min A required to maintain balance and return hands to walker.  Pt walked at an unsafe speed (too fast) at times.  Gait velocity unsafe  Gait velocity interpretation at or above normal speed for age/gender  Stairs Yes  Stairs assistance Min guard  Stair Management One rail Left;Step to pattern;Alternating pattern;With cane  Number of Stairs 6  General stair comments VC's required for step to sequence and cane management.  Pt started with step to pattern and switched to alternating without cueing to.  Min guard for safety as pt was impulsive.  Pt and wife stated he is planning on using a stack of chairs L for support on stairs going into house.  Balance  Overall balance assessment Needs assistance  Sitting-balance support Feet supported;Bilateral upper extremity supported  Sitting balance-Leahy Scale Poor  Sitting balance - Comments Pt with both arms across tray table at [facility] start of session.  When tray table was removed and pt instructed to dawn gown for backside, he leaned to the R, indicating possible LOB.  Standing balance support Bilateral upper extremity supported  Standing balance-Leahy Scale  Poor  Standing balance comment Reliant on RW   General Comments  General comments (skin integrity, edema, etc.) Pt required continuous education on RW use and safety  awareness.   PT - End of Session  Equipment Utilized During Treatment Gait belt;Cervical collar  Activity Tolerance Patient tolerated treatment well  Patient left in chair;with call bell/phone within reach;with family/visitor present  Nurse Communication Mobility status  PT - Assessment/Plan  PT Plan Current plan remains appropriate  PT Visit Diagnosis Repeated falls (R29.6);Other abnormalities of gait and mobility (R26.89);Pain  Pain - part of body (Neck, L shoulder, L leg(hip))  PT Frequency (ACUTE ONLY) Min 5X/week  Follow Up Recommendations Home health PT;Supervision/Assistance - 24 hour  PT equipment None recommended by PT  AM-PAC PT "6 Clicks" Daily Activity Outcome Measure  Difficulty turning over in bed (including adjusting bedclothes, sheets and blankets)? 2  Difficulty moving from lying on back to sitting on the side of the bed?  1  Difficulty sitting down on and standing up from a chair with arms (e.g., wheelchair, bedside commode, etc,.)? 1  Help needed moving to and from a bed to chair (including a wheelchair)? 3  Help needed walking in hospital room? 3  Help needed climbing 3-5 steps with a railing?  3  6 Click Score 13  Mobility G Code  CK  PT Goal Progression  Progress towards PT goals Progressing toward goals  Acute Rehab PT Goals  PT Goal Formulation With patient  Time For Goal Achievement 04/11/17  Potential to Achieve Goals Good  PT Time Calculation  PT Start Time (ACUTE ONLY) 0837  PT Stop Time (ACUTE ONLY) 0859  PT Time Calculation (min) (ACUTE ONLY) 22 min    Gaetano Net SPT

## 2017-04-04 NOTE — Discharge Summary (Signed)
Physician Discharge Summary  Patient ID: Kyle Owen MRN: [id number] DOB/AGE: 03/15/1941 76 y.o.  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 Following uncomplicated ACDF, he recovered nicely and transferred to [facility] for nursing care. His strength is improved and he is mobilizing well.  Consults: None  Significant Diagnostic Studies: radiology: X-Ray: intra-op  Treatments: surgery: Cervical three-four, Cervical four-five Anterior cervical decompression/discectomy/fusion (N/A) with PEEK cages, autograft, anterior cervical plate    Discharge Exam: Blood pressure 111/68, pulse 72, temperature 98.9 F (37.2 C), temperature source Oral, resp. rate 18, SpO2 97 %. Alert, conversant, sitting at EOB. Wife present. Good strength BUE including hand intrinsics. Ambulating this morning. Incision flat without erythema, swelling, or drainage benath Dermabond &honeycomb.     Disposition: 01-Home or Self Care  HHPT. Pt verbalizes understanding of d/c instructions. He may resume Eliquis in 5 days. Rx for Norco &Robaxin will be sent with pt for prn use. Pt will call office tio schedule 3-4 wk f/u appt.     Discharge Instructions    Diet - low sodium heart healthy     Complete by:  As directed    Increase activity slowly    Complete by:  As directed      Allergies as of 04/04/2017      Reactions   Flomax [tamsulosin Hcl] Nausea And Vomiting, Other (See Comments)   "Pt thought he was dying " DIZZY      Medication List    STOP taking these medications   ELIQUIS 5 MG Tabs tablet Generic drug:  apixaban     TAKE these medications   acetaminophen 500 MG tablet Commonly known as:  TYLENOL Take 500 mg by mouth daily.   atorvastatin 20 MG tablet Commonly known as:  LIPITOR Take 1 tablet (20 mg total) by mouth daily. What changed:  when to take this   budesonide-formoterol 160-4.5 MCG/ACT inhaler Commonly known as:  SYMBICORT Inhale 2 puffs into the lungs 2 (two) times daily.   CENTRUM SILVER PO Take 1 tablet by mouth every morning.   finasteride 5 MG tablet Commonly known as:  PROSCAR Take 5 mg by mouth every morning.   HYDROcodone-acetaminophen 5-325 MG tablet Commonly known as:  NORCO/VICODIN Take 1-2 tablets by mouth every 4 (four) hours as needed (breakthrough pain).   Magnesium 400 MG Caps Take 400 mg by mouth every morning.   methocarbamol 500 MG tablet Commonly known as:  ROBAXIN Take 1 tablet (500 mg total) by mouth every 6 (six) hours as needed for muscle spasms.   sertraline 50 MG tablet Commonly known as:  ZOLOFT Take 50 mg by mouth every morning.   VENTOLIN HFA 108 (90 Base) MCG/ACT inhaler Generic drug:  albuterol Inhale 2 puffs into the lungs Every 4 hours as needed for wheezing or shortness of breath. Reported on 12/23/2015   verapamil 240 MG CR tablet Commonly known as:  CALAN-SR Take 240 mg  by mouth every morning.            Durable Medical Equipment        Start     Ordered   04/03/17 1612  For home use only DME Walker rolling  Once    Question:  Patient needs a walker to treat with the following condition  Answer:  HNP (herniated nucleus pulposus), lumbar   04/03/17 1612        Signed: Peggyann Shoals, MD 04/04/2017, 8:19 AM

## 2017-04-05 ENCOUNTER — Encounter (HOSPITAL_COMMUNITY): Payer: Medicare Other | Admitting: Physical Therapy

## 2017-04-06 DIAGNOSIS — M5416 Radiculopathy, lumbar region: Secondary | ICD-10-CM | POA: Diagnosis not present

## 2017-04-06 DIAGNOSIS — Z8673 Personal history of transient ischemic attack (TIA), and cerebral infarction without residual deficits: Secondary | ICD-10-CM | POA: Diagnosis not present

## 2017-04-06 DIAGNOSIS — Z4789 Encounter for other orthopedic aftercare: Secondary | ICD-10-CM | POA: Diagnosis not present

## 2017-04-06 DIAGNOSIS — M1990 Unspecified osteoarthritis, unspecified site: Secondary | ICD-10-CM | POA: Diagnosis not present

## 2017-04-06 DIAGNOSIS — I4891 Unspecified atrial fibrillation: Secondary | ICD-10-CM | POA: Diagnosis not present

## 2017-04-06 DIAGNOSIS — Z8551 Personal history of malignant neoplasm of bladder: Secondary | ICD-10-CM | POA: Diagnosis not present

## 2017-04-06 DIAGNOSIS — M5412 Radiculopathy, cervical region: Secondary | ICD-10-CM | POA: Diagnosis not present

## 2017-04-06 DIAGNOSIS — J449 Chronic obstructive pulmonary disease, unspecified: Secondary | ICD-10-CM | POA: Diagnosis not present

## 2017-04-06 DIAGNOSIS — F329 Major depressive disorder, single episode, unspecified: Secondary | ICD-10-CM | POA: Diagnosis not present

## 2017-04-09 DIAGNOSIS — I4891 Unspecified atrial fibrillation: Secondary | ICD-10-CM | POA: Diagnosis not present

## 2017-04-09 DIAGNOSIS — M5412 Radiculopathy, cervical region: Secondary | ICD-10-CM | POA: Diagnosis not present

## 2017-04-09 DIAGNOSIS — Z4789 Encounter for other orthopedic aftercare: Secondary | ICD-10-CM | POA: Diagnosis not present

## 2017-04-09 DIAGNOSIS — M1990 Unspecified osteoarthritis, unspecified site: Secondary | ICD-10-CM | POA: Diagnosis not present

## 2017-04-09 DIAGNOSIS — J449 Chronic obstructive pulmonary disease, unspecified: Secondary | ICD-10-CM | POA: Diagnosis not present

## 2017-04-09 DIAGNOSIS — M4712 Other spondylosis with myelopathy, cervical region: Secondary | ICD-10-CM | POA: Diagnosis not present

## 2017-04-09 DIAGNOSIS — M5416 Radiculopathy, lumbar region: Secondary | ICD-10-CM | POA: Diagnosis not present

## 2017-04-09 DIAGNOSIS — M5021 Other cervical disc displacement,  high cervical region: Secondary | ICD-10-CM | POA: Diagnosis not present

## 2017-04-11 ENCOUNTER — Encounter: Payer: Self-pay | Admitting: Internal Medicine

## 2017-04-11 ENCOUNTER — Telehealth: Payer: Self-pay | Admitting: Cardiology

## 2017-04-11 DIAGNOSIS — Z4789 Encounter for other orthopedic aftercare: Secondary | ICD-10-CM | POA: Diagnosis not present

## 2017-04-11 DIAGNOSIS — M5416 Radiculopathy, lumbar region: Secondary | ICD-10-CM | POA: Diagnosis not present

## 2017-04-11 DIAGNOSIS — J449 Chronic obstructive pulmonary disease, unspecified: Secondary | ICD-10-CM | POA: Diagnosis not present

## 2017-04-11 DIAGNOSIS — I4891 Unspecified atrial fibrillation: Secondary | ICD-10-CM | POA: Diagnosis not present

## 2017-04-11 DIAGNOSIS — M1990 Unspecified osteoarthritis, unspecified site: Secondary | ICD-10-CM | POA: Diagnosis not present

## 2017-04-11 DIAGNOSIS — M5412 Radiculopathy, cervical region: Secondary | ICD-10-CM | POA: Diagnosis not present

## 2017-04-11 NOTE — Telephone Encounter (Signed)
This encounter was created in error - please disregard.

## 2017-04-11 NOTE — Telephone Encounter (Signed)
Follow Up:    Pt returning a call.

## 2017-04-11 NOTE — Telephone Encounter (Signed)
Spoke with patient who is concerned about being off his Eliquis for so long.  It was stopped for a planned surgery on 4/7 and he is still being told to hold it by his surgeon.  Pt reports he is to return to the surgeon on Monday to discuss the possible need for further surgery.  He would like someone from the device clinic to call him back to discuss this.  Advised I will forward this information as requested.

## 2017-04-11 NOTE — Telephone Encounter (Signed)
Discussed with Dr Rayann Heman and he says that he always recommends to resume blood thinners as soon as the surgeon feels that the patient is medically stable.  It is at the discretion of the surgeon as to when to restart based on the procedure preformed  and history of patient.

## 2017-04-11 NOTE — Telephone Encounter (Signed)
New Message   Per pt he has been instructed to stay off his Eliquis a little longer. Recently had loop recorder put in. Requesting call back

## 2017-04-13 DIAGNOSIS — M5416 Radiculopathy, lumbar region: Secondary | ICD-10-CM | POA: Diagnosis not present

## 2017-04-13 DIAGNOSIS — M1990 Unspecified osteoarthritis, unspecified site: Secondary | ICD-10-CM | POA: Diagnosis not present

## 2017-04-13 DIAGNOSIS — I4891 Unspecified atrial fibrillation: Secondary | ICD-10-CM | POA: Diagnosis not present

## 2017-04-13 DIAGNOSIS — J449 Chronic obstructive pulmonary disease, unspecified: Secondary | ICD-10-CM | POA: Diagnosis not present

## 2017-04-13 DIAGNOSIS — M5412 Radiculopathy, cervical region: Secondary | ICD-10-CM | POA: Diagnosis not present

## 2017-04-13 DIAGNOSIS — Z4789 Encounter for other orthopedic aftercare: Secondary | ICD-10-CM | POA: Diagnosis not present

## 2017-04-13 LAB — CUP PACEART REMOTE DEVICE CHECK
Date Time Interrogation Session: 20180408181218
Implantable Pulse Generator Implant Date: 20160816

## 2017-04-17 DIAGNOSIS — M1990 Unspecified osteoarthritis, unspecified site: Secondary | ICD-10-CM | POA: Diagnosis not present

## 2017-04-17 DIAGNOSIS — M5416 Radiculopathy, lumbar region: Secondary | ICD-10-CM | POA: Diagnosis not present

## 2017-04-17 DIAGNOSIS — J449 Chronic obstructive pulmonary disease, unspecified: Secondary | ICD-10-CM | POA: Diagnosis not present

## 2017-04-17 DIAGNOSIS — Z4789 Encounter for other orthopedic aftercare: Secondary | ICD-10-CM | POA: Diagnosis not present

## 2017-04-17 DIAGNOSIS — I4891 Unspecified atrial fibrillation: Secondary | ICD-10-CM | POA: Diagnosis not present

## 2017-04-17 DIAGNOSIS — M5412 Radiculopathy, cervical region: Secondary | ICD-10-CM | POA: Diagnosis not present

## 2017-04-19 DIAGNOSIS — Z4789 Encounter for other orthopedic aftercare: Secondary | ICD-10-CM | POA: Diagnosis not present

## 2017-04-19 DIAGNOSIS — M1990 Unspecified osteoarthritis, unspecified site: Secondary | ICD-10-CM | POA: Diagnosis not present

## 2017-04-19 DIAGNOSIS — M5412 Radiculopathy, cervical region: Secondary | ICD-10-CM | POA: Diagnosis not present

## 2017-04-19 DIAGNOSIS — M5416 Radiculopathy, lumbar region: Secondary | ICD-10-CM | POA: Diagnosis not present

## 2017-04-19 DIAGNOSIS — J449 Chronic obstructive pulmonary disease, unspecified: Secondary | ICD-10-CM | POA: Diagnosis not present

## 2017-04-19 DIAGNOSIS — I4891 Unspecified atrial fibrillation: Secondary | ICD-10-CM | POA: Diagnosis not present

## 2017-04-23 DIAGNOSIS — Z4789 Encounter for other orthopedic aftercare: Secondary | ICD-10-CM | POA: Diagnosis not present

## 2017-04-23 DIAGNOSIS — M1990 Unspecified osteoarthritis, unspecified site: Secondary | ICD-10-CM | POA: Diagnosis not present

## 2017-04-23 DIAGNOSIS — I4891 Unspecified atrial fibrillation: Secondary | ICD-10-CM | POA: Diagnosis not present

## 2017-04-23 DIAGNOSIS — J449 Chronic obstructive pulmonary disease, unspecified: Secondary | ICD-10-CM | POA: Diagnosis not present

## 2017-04-23 DIAGNOSIS — M5412 Radiculopathy, cervical region: Secondary | ICD-10-CM | POA: Diagnosis not present

## 2017-04-23 DIAGNOSIS — M5416 Radiculopathy, lumbar region: Secondary | ICD-10-CM | POA: Diagnosis not present

## 2017-04-25 DIAGNOSIS — M5412 Radiculopathy, cervical region: Secondary | ICD-10-CM | POA: Diagnosis not present

## 2017-04-25 DIAGNOSIS — J449 Chronic obstructive pulmonary disease, unspecified: Secondary | ICD-10-CM | POA: Diagnosis not present

## 2017-04-25 DIAGNOSIS — M5416 Radiculopathy, lumbar region: Secondary | ICD-10-CM | POA: Diagnosis not present

## 2017-04-25 DIAGNOSIS — I4891 Unspecified atrial fibrillation: Secondary | ICD-10-CM | POA: Diagnosis not present

## 2017-04-25 DIAGNOSIS — Z4789 Encounter for other orthopedic aftercare: Secondary | ICD-10-CM | POA: Diagnosis not present

## 2017-04-25 DIAGNOSIS — M1990 Unspecified osteoarthritis, unspecified site: Secondary | ICD-10-CM | POA: Diagnosis not present

## 2017-05-01 ENCOUNTER — Ambulatory Visit (INDEPENDENT_AMBULATORY_CARE_PROVIDER_SITE_OTHER): Payer: Medicare Other | Admitting: *Deleted

## 2017-05-01 DIAGNOSIS — I639 Cerebral infarction, unspecified: Secondary | ICD-10-CM

## 2017-05-01 NOTE — Progress Notes (Signed)
Carelink Summary Report / Loop Recorder 

## 2017-05-11 LAB — CUP PACEART REMOTE DEVICE CHECK
Date Time Interrogation Session: 20180508184845
Implantable Pulse Generator Implant Date: 20160816

## 2017-05-16 DIAGNOSIS — I10 Essential (primary) hypertension: Secondary | ICD-10-CM | POA: Diagnosis not present

## 2017-05-16 DIAGNOSIS — M4712 Other spondylosis with myelopathy, cervical region: Secondary | ICD-10-CM | POA: Diagnosis not present

## 2017-05-16 DIAGNOSIS — M5021 Other cervical disc displacement,  high cervical region: Secondary | ICD-10-CM | POA: Diagnosis not present

## 2017-05-16 DIAGNOSIS — M5412 Radiculopathy, cervical region: Secondary | ICD-10-CM | POA: Diagnosis not present

## 2017-05-16 DIAGNOSIS — M542 Cervicalgia: Secondary | ICD-10-CM | POA: Diagnosis not present

## 2017-05-16 DIAGNOSIS — Z6827 Body mass index (BMI) 27.0-27.9, adult: Secondary | ICD-10-CM | POA: Diagnosis not present

## 2017-05-28 DIAGNOSIS — Z79899 Other long term (current) drug therapy: Secondary | ICD-10-CM | POA: Diagnosis not present

## 2017-05-28 DIAGNOSIS — N183 Chronic kidney disease, stage 3 (moderate): Secondary | ICD-10-CM | POA: Diagnosis not present

## 2017-05-28 NOTE — Addendum Note (Signed)
Addendum  created 05/28/17 1335 by Oleta Mouse, MD   Sign clinical note

## 2017-05-29 NOTE — Progress Notes (Signed)
Cardiology Office Note    Date:  05/30/2017   ID:  KRISTIN LAMAGNA, DOB 02/18/1941, MRN [id number]  PCP:  Asencion Noble, MD  Cardiologist:  Dr. Marlou Porch / Dr. Rayann Heman (EP)  CC: follow up   History of Present Illness:  Kyle Owen is a 76 y.o. male with a history of PAF on Eliquis, CVA, PAF found on Linq, now on Eliquis, ascending aortic aneurysm, ocular migraines, bladder cancer and COPD who presents to clinic for follow up.   He was admitted in 07/2015 for CVA. 2D ECHO at that time showed normal LV function, G1DD. He had a LINQ recorder placed this admission. He has since been found to have PAF with a low burden and has been placed on Eliquis. He was followed by Roderic Palau in the afib clinic.   He established care with Dr. Marlou Porch in 11/2016 and was doing well. His simvastatin was switched to atorvastatin.  He has a smooth 4.8 smooth fusiform ascending aneurysm that has been stable. This is followed by Dr. Nils Pyle. Plan is for repeat CT scan in 06/2017.   Today he presents to clinic for follow up. He recent had neck surgery in 03/2017 and has had a slow recovery. He has been having swallowing issues since and lost some weight. He occasionally gets a left sided chest pain that comes on at rest. Not related to exertion. It lasts about 1/2 hour. He has had this several times. It is not associated with SOB, nauseated or diaphoresis. He has recently started exercising and has been doing well with this. He is very active doing yard work. He does gets some SOB with walking to the mailbox. He does have a history of COPD but quit smoking about 5 years ago. He has had LE edema for the past year that gets worse as the day progresses. He wears compression stockings. He sleeps in a recliner a lot because its is more comfortable. He occasionally has dizziness but no syncope. No blood in stool or urine. No palpitations.    Past Medical History:  Diagnosis Date  . Arthritis    DJD right knee  . Benign  prostatic hypertrophy with urinary frequency   . Bladder cancer ([place])   . COPD with emphysema ([place])   . Depression   . Dyspnea    when walking  . Dysrhythmia    a fib  . History of colon polyps   . History of pneumonia    hx Recurrent -- last bout Dec 2016 CAP-- per pt resolved  . 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 Hyperlipidemia   . 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 Nephrolithiasis    right non-obstructive  per CT 05-02-2016  . Ocular migraine    controlled w/ verapamil  . PAF (paroxysmal atrial fibrillation) ([place])    followed by AFIB clinic-- cardiologist-- dr Marlou Porch  . Pneumonia    history of pnu.  . Pulmonary nodule, left    left lower lobe x2 per CT 01-20-2016  . Renal cyst, left   . Thoracic aortic aneurysm without rupture ([place])    ascending -- ct chest 01/10/16  4.8cm  . Wears hearing aid    bilateral-- wear at times  Past Surgical History:  Procedure Laterality Date  . ANTERIOR CERVICAL DECOMP/DISCECTOMY FUSION N/A 04/03/2017   Procedure: Cervical three-four, Cervical four-five Anterior cervical decompression/discectomy/fusion;  Surgeon: Erline Levine, MD;  Location: [facility];  Service: Neurosurgery;  Laterality: N/A;  . CATARACT EXTRACTION W/ INTRAOCULAR LENS IMPLANT Right 2016  . CATARACT EXTRACTION W/PHACO  12/16/2012   Procedure: CATARACT EXTRACTION PHACO AND INTRAOCULAR LENS PLACEMENT (IOC);  Surgeon: Tonny Branch, MD;  Location: [facility];  Service: Ophthalmology;  Laterality: Left;  CDE:17.31  . COLONOSCOPY  10/03/2011   Procedure: COLONOSCOPY;  Surgeon: Jamesetta So;  Location: [facility];  Service: Gastroenterology;  Laterality: N/A;  . DECOMPRESSIOIN ULNAR NERVE AND  CUBITAL TUNNEL RELEASE Left 07-14-2009   elbow  . 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 INGUINAL HERNIA REPAIR Right 1990's  . KNEE ARTHROSCOPY Right 2015  . LEFT CUBITAL TUNNEL RELEASE    . POSTERIOR LUMBAR FUSION  2014  . SHOULDER ARTHROSCOPY Left 1990's  . TEE WITHOUT CARDIOVERSION N/A 07/27/2015   Procedure: TRANSESOPHAGEAL ECHOCARDIOGRAM (TEE);  Surgeon: Lelon Perla, MD;  Location: [facility];  Service: Cardiovascular;  Laterality: N/A;   normal LV function, ef 55-60%,  mild AR and MR, mild dilated ascending aorta (4.2cm),  mild to moderate atherosclerosis  descending aorta,  mild to moderate TR,  negative saline microcavitation study  . THYROIDECTOMY Right 01/20/2015   Procedure: RIGHT THYROIDECTOMY;  Surgeon: Ascencion Dike, MD;  Location: [place];  Service: ENT;  Laterality: Right;  . TONSILLECTOMY  as child  . TRANSURETHRAL RESECTION OF BLADDER TUMOR N/A 05/15/2016   Procedure: TRANSURETHRAL RESECTION OF BLADDER TUMOR (TURBT) AND INSTILLATION OF EPIRUBICIN;  Surgeon: Franchot Gallo, MD;  Location: [facility];  Service: Urology;  Laterality: N/A;    Current Medications: Outpatient Medications Prior to Visit  Medication Sig Dispense Refill  . acetaminophen (TYLENOL) 500 MG tablet Take 500 mg by mouth daily.     . budesonide-formoterol (SYMBICORT) 160-4.5 MCG/ACT inhaler Inhale 2 puffs into the lungs 2 (two) times daily.    . finasteride (PROSCAR) 5 MG tablet Take 5 mg by mouth every morning.     . Magnesium 400 MG CAPS Take 400 mg by mouth every morning.     . Multiple Vitamins-Minerals (CENTRUM SILVER PO) Take 1 tablet by mouth every morning.     . sertraline (ZOLOFT) 50 MG tablet Take 50 mg by mouth every morning.     . VENTOLIN HFA 108 (90 BASE) MCG/ACT inhaler Inhale 2 puffs into the lungs Every 4 hours as needed for wheezing or shortness of breath. Reported on 12/23/2015    . verapamil (CALAN-SR) 240 MG  CR tablet Take 240 mg by mouth every morning.     Marland Kitchen HYDROcodone-acetaminophen (NORCO/VICODIN) 5-325 MG tablet Take 1-2 tablets by mouth every 4 (four) hours as needed (breakthrough pain). 60 tablet 0  . methocarbamol (ROBAXIN) 500 MG tablet Take 1 tablet (500 mg total) by mouth every 6 (six) hours as needed for muscle spasms. 60 tablet 1  . atorvastatin (LIPITOR) 20 MG tablet Take 1 tablet (20 mg total) by mouth daily. (Patient taking differently: Take 20 mg by mouth daily at 6 PM. ) 90 tablet 3   No facility-administered medications prior to visit.      Allergies:   Flomax [tamsulosin hcl]   Social History   Social History  . Marital status: Married    Spouse name: N/A  .  Number of children: 3  . Years of education: 14   Occupational History  . [employer] and [employer]     retired   Social History Main Topics  . Smoking status: Former Smoker    Packs/day: 1.00    Years: 45.00    Types: Cigarettes    Quit date: 09/26/2010  . Smokeless tobacco: Never Used  . Alcohol use No  . Drug use: No  . Sexual activity: Not Asked   Other Topics Concern  . None   Social History Narrative   Widowed, lives with dog, Jake in [place], wife passed from Parkinson's disease 3 1/2 yrs ago   1 cup coffee daily     Family History:  The patient's family history includes Breast cancer in his sister; Stroke in his brother and sister.      ROS:   Please see the history of present illness.    ROS All other systems reviewed and are negative.   PHYSICAL EXAM:   VS:  BP 124/70   Pulse 60   Ht 6\' 1"  (1.854 m)   Wt 213 lb 6.4 oz (96.8 kg)   BMI 28.15 kg/m    GEN: Well nourished, well developed, in no acute distress  HEENT: normal  Neck: no JVD, carotid bruits, or masses Cardiac: RRR; no murmurs, rubs, or gallops 1-2+ bilateral LE edema  Respiratory:  clear to auscultation bilaterally, normal work of breathing GI: soft, nontender, nondistended, + BS MS: no deformity or atrophy  Skin: warm and  dry, no rash Neuro:  Alert and Oriented x 3, Strength and sensation are intact Psych: euthymic mood, full affect   Wt Readings from Last 3 Encounters:  05/30/17 213 lb 6.4 oz (96.8 kg)  03/28/17 217 lb 4.8 oz (98.6 kg)  11/30/16 215 lb 6.4 oz (97.7 kg)      Studies/Labs Reviewed:   EKG:  EKG is ordered today.  The ekg ordered today demonstrates NSR, HR 60  Recent Labs: 03/28/2017: BUN 29; Creatinine, Ser 1.33; Hemoglobin 14.1; Platelets 152; Potassium 4.5; Sodium 139   Lipid Panel    Component Value Date/Time   CHOL 225 (H) 06/09/2015 1040   TRIG 100 06/09/2015 1040   HDL 68 06/09/2015 1040   CHOLHDL 3.3 06/09/2015 1040   LDLCALC 137 (H) 06/09/2015 1040    Additional studies/ records that were reviewed today include:  Echocardiogram 06/10/15: - Left ventricle: The cavity size was normal. Wall thickness was  normal. Systolic function was normal. The estimated ejection  fraction was in the range of 60% to 65%. Wall motion was normal;  there were no regional wall motion abnormalities. Doppler  parameters are consistent with abnormal left ventricular  relaxation (grade 1 diastolic dysfunction). The E/e&' ratio is <8,  suggesting normal LV filling pressure. - Aortic valve: Trileaflet. Sclerosis without stenosis. There was  trivial regurgitation. - Left atrium: The atrium was normal in size. - Right atrium: Moderately dilated at 25 cm2. - Tricuspid valve: There was mild regurgitation. - Pulmonary arteries: PA peak pressure: 29 mm Hg (S). - Inferior vena cava: The vessel was normal in size. The  respirophasic diameter changes were in the normal range (= 50%),  consistent with normal central venous pressure. Impressions: - Compared to a prior echo in 2015, there are few changes. RVSP is  lower at 29 mmHg.   ASSESSMENT & PLAN:   PAF: maintaing NSR today. Continue Elqiuis 5mg  BID for CHADSVASC of at least (HTN, age, vasc dz (noted on CT  scan)). Continue Verapamil SR  240mg  daily.   Cryptogenic CVA: found to have afib on Linq recorder and now on Eliquis  Ascending aortic aneurysm: he has a smooth 4.8 smooth fusiform ascending aneurysm that has been stable. This is followed by Dr. Nils Pyle. Plan is for repeat CT scan in 06/2017. I will get this set up.  HLD: continue statin   COPD: stable  Thoracic aortic atherosclerosis: continue aspirin and statin   Chest pain/DOE: his CP is fairly atypical and not related to exertion. He does get SOB with exertion but also has a significant history of COPD. I will get a lexiscan myoview given RFs for CAD and coronary and aortic atherosclerosis noted on CT scan   LE edema: will check a BMET and BNP and start a diuretic if BNP elevated. Will assess LV function with myoview as well. If BNP normal, likely from venous insufficiency and he will continue compression stockings and leg elevation   Medication Adjustments/Labs and Tests Ordered: Current medicines are reviewed at length with the patient today.  Concerns regarding medicines are outlined above.  Medication changes, Labs and Tests ordered today are listed in the Patient Instructions below. Patient Instructions  Medication Instructions:  Your physician recommends that you continue on your current medications as directed. Please refer to the Current Medication list given to you today.   Labwork: TODAY:  BMET & PRO BNP  Testing/Procedures: Your physician has requested that you have a lexiscan myoview. For further information please visit [email or web address]. Please follow instruction sheet, as given.   Your physician recommends that you have a Non-Cardiac CT Angiography (CTA), in AUGUST 2018.  Non-Cardiac CT Angiography (CTA) is a special type of CT scan that uses a computer to produce multi-dimensional views of major blood vessels throughout the body. In CT angiography, a contrast material is injected through an IV to help visualize the blood  vessels   Follow-Up: Your physician recommends that you schedule a follow-up appointment in: DR. Marlou Porch IN 3 MONTHS  Any Other Special Instructions Will Be Listed Below (If Applicable). Regadenoson injection What is this medicine? REGADENOSON is used to test the heart for coronary artery disease. It is used in patients who can not exercise for their stress test. This medicine may be used for other purposes; ask your health care provider or pharmacist if you have questions. COMMON BRAND NAME(S): Lexiscan What should I tell my health care provider before I take this medicine? They need to know if you have any of these conditions: -heart problems -lung or breathing disease, like asthma or COPD -an unusual or allergic reaction to regadenoson, other medicines, foods, dyes, or preservatives -pregnant or trying to get pregnant -breast-feeding How should I use this medicine? This medicine is for injection into a vein. It is given by a health care professional in a hospital or clinic setting. Talk to your pediatrician regarding the use of this medicine in children. Special care may be needed. Overdosage: If you think you have taken too much of this medicine contact a poison control center or emergency room at once. NOTE: This medicine is only for you. Do not share this medicine with others. What if I miss a dose? This does not apply. What may interact with this medicine? -caffeine -dipyridamole -guarana -theophylline This list may not describe all possible interactions. Give your health care provider a list of all the medicines, herbs, non-prescription drugs, or dietary supplements you use. Also tell them if you smoke, drink alcohol, or use  illegal drugs. Some items may interact with your medicine. What should I watch for while using this medicine? Your condition will be monitored carefully while you are receiving this medicine. Do not take medicines, foods, or drinks with caffeine (like  coffee, tea, or colas) for at least 12 hours before your test. If you do not know if something contains caffeine, ask your health care professional. What side effects may I notice from receiving this medicine? Side effects that you should report to your doctor or health care professional as soon as possible: -allergic reactions like skin rash, itching or hives, swelling of the face, lips, or tongue -breathing problems -chest pain, tightness or palpitations -severe headache Side effects that usually do not require medical attention (report to your doctor or health care professional if they continue or are bothersome): -flushing -headache -irritation or pain at site where injected -nausea, vomiting This list may not describe all possible side effects. Call your doctor for medical advice about side effects. You may report side effects to FDA at 1-800-FDA-1088. Where should I keep my medicine? This drug is given in a hospital or clinic and will not be stored at home. NOTE: This sheet is a summary. It may not cover all possible information. If you have questions about this medicine, talk to your doctor, pharmacist, or health care provider.  2018 Elsevier/Gold Standard (2008-08-10 15:08:13)   CT Angiogram A CT angiogram is a procedure to look at the blood vessels in various areas of the body. For this procedure, a large X-ray machine, called a CT scanner, takes detailed pictures of blood vessels that have been injected with a dye (contrast material). A CT angiogram allows your health care provider to see how well blood is flowing to the area of your body that is being checked. Your health care provider will be able to see if there are any problems, such as a blockage. Tell a health care provider about:  Any allergies you have.  All medicines you are taking, including vitamins, herbs, eye drops, creams, and over-the-counter medicines.  Any problems you or family members have had with anesthetic  medicines.  Any blood disorders you have.  Any surgeries you have had.  Any medical conditions you have.  Whether you are pregnant or may be pregnant.  Whether you are breastfeeding.  Any anxiety disorders, chronic pain, or other conditions you have that may increase your stress or prevent you from lying still. What are the risks? Generally, this is a safe procedure. However, problems may occur, including:  Infection.  Bleeding.  Allergic reactions to medicines or dyes.  Damage to other structures or organs.  Kidney damage from the dye or contrast that is used.  Increased risk of cancer from radiation exposure. This risk is low. Talk with your health care provider about: ? The risks and benefits of testing. ? How you can receive the lowest dose of radiation.  What happens before the procedure?  Wear comfortable clothing and remove any jewelry.  Follow instructions from your health care provider about eating and drinking. For most people, instructions may include these actions: ? For 12 hours before the test, avoid caffeine. This includes tea, coffee, soda, and energy drinks or pills. ? For 3-4 hours before the test, stop eating or drinking anything but water. ? Stay well hydrated by continuing to drink water before the exam. This will help to clear the contrast dye from your body after the test.  Ask your health care provider about  changing or stopping your regular medicines. This is especially important if you are taking diabetes medicines or blood thinners. What happens during the procedure?  An IV tube will be inserted into one of your veins.  You will be asked to lie on an exam table. This table will slide in and out of the CT machine during the procedure.  Contrast dye will be injected into the IV tube. You might feel warm, or you may get a metallic taste in your mouth.  The table that you are lying on will move into the CT machine tunnel for the scan.  The  person running the machine will give you instructions while the scans are being done. You may be asked to: ? Keep your arms above your head. ? Hold your breath. ? Stay very still, even if the table is moving.  When the scanning is complete, you will be moved out of the machine.  The IV tube will be removed. The procedure may vary among health care providers and hospitals. What happens after the procedure?  You might feel warm, or you may get a metallic taste in your mouth.  You may be asked to drink water or other fluids to wash (flush) the contrast material out of your body.  It is up to you to get the results of your procedure. Ask your health care provider, or the department that is doing the procedure, when your results will be ready. Summary  A CT angiogram is a procedure to look at the blood vessels in various areas of the body.  You will need to stay very still during the exam.  You may be asked to drink water or other fluids to wash (flush) the contrast material out of your body after your scan. This information is not intended to replace advice given to you by your health care provider. Make sure you discuss any questions you have with your health care provider. Document Released: 08/10/2016 Document Revised: 08/10/2016 Document Reviewed: 08/10/2016 Elsevier Interactive Patient Education  [employer].    If you need a refill on your cardiac medications before your next appointment, please call your pharmacy.      Signed, Angelena Form, PA-C  05/30/2017 11:04 AM    [facility] [address], [place], [place]  [address] Phone: [phone]; Fax: [phone]

## 2017-05-30 ENCOUNTER — Ambulatory Visit (INDEPENDENT_AMBULATORY_CARE_PROVIDER_SITE_OTHER): Payer: Medicare Other | Admitting: Physician Assistant

## 2017-05-30 ENCOUNTER — Encounter: Payer: Self-pay | Admitting: Physician Assistant

## 2017-05-30 VITALS — BP 124/70 | HR 60 | Ht 73.0 in | Wt 213.4 lb

## 2017-05-30 DIAGNOSIS — I6389 Other cerebral infarction: Secondary | ICD-10-CM

## 2017-05-30 DIAGNOSIS — I638 Other cerebral infarction: Secondary | ICD-10-CM | POA: Diagnosis not present

## 2017-05-30 DIAGNOSIS — I712 Thoracic aortic aneurysm, without rupture: Secondary | ICD-10-CM | POA: Diagnosis not present

## 2017-05-30 DIAGNOSIS — E785 Hyperlipidemia, unspecified: Secondary | ICD-10-CM

## 2017-05-30 DIAGNOSIS — Z8679 Personal history of other diseases of the circulatory system: Secondary | ICD-10-CM | POA: Diagnosis not present

## 2017-05-30 DIAGNOSIS — R600 Localized edema: Secondary | ICD-10-CM | POA: Diagnosis not present

## 2017-05-30 DIAGNOSIS — I480 Paroxysmal atrial fibrillation: Secondary | ICD-10-CM

## 2017-05-30 DIAGNOSIS — R079 Chest pain, unspecified: Secondary | ICD-10-CM | POA: Diagnosis not present

## 2017-05-30 DIAGNOSIS — J449 Chronic obstructive pulmonary disease, unspecified: Secondary | ICD-10-CM

## 2017-05-30 DIAGNOSIS — I7121 Aneurysm of the ascending aorta, without rupture: Secondary | ICD-10-CM

## 2017-05-30 DIAGNOSIS — I639 Cerebral infarction, unspecified: Secondary | ICD-10-CM | POA: Diagnosis not present

## 2017-05-30 LAB — BASIC METABOLIC PANEL
BUN/Creatinine Ratio: 18 (ref 10–24)
BUN: 21 mg/dL (ref 8–27)
CO2: 24 mmol/L (ref 18–29)
Calcium: 9.1 mg/dL (ref 8.6–10.2)
Chloride: 101 mmol/L (ref 96–106)
Creatinine, Ser: 1.18 mg/dL (ref 0.76–1.27)
GFR calc Af Amer: 68 mL/min/{1.73_m2} (ref >59)
GFR calc non Af Amer: 59 mL/min/{1.73_m2} — ABNORMAL LOW (ref >59)
Glucose: 90 mg/dL (ref 65–99)
Potassium: 4.4 mmol/L (ref 3.5–5.2)
Sodium: 142 mmol/L (ref 134–144)

## 2017-05-30 LAB — PRO B NATRIURETIC PEPTIDE: NT-Pro BNP: 480 pg/mL (ref 0–486)

## 2017-05-30 NOTE — Addendum Note (Signed)
Addended by: Gaetano Net on: 05/30/2017 11:13 AM   Modules accepted: Orders

## 2017-05-30 NOTE — Patient Instructions (Addendum)
Medication Instructions:  Your physician recommends that you continue on your current medications as directed. Please refer to the Current Medication list given to you today.   Labwork: TODAY:  BMET & PRO BNP  Testing/Procedures: Your physician has requested that you have a lexiscan myoview. For further information please visit [email or web address]. Please follow instruction sheet, as given.   Your physician recommends that you have a Non-Cardiac CT Angiography (CTA), in AUGUST 2018.  Non-Cardiac CT Angiography (CTA) is a special type of CT scan that uses a computer to produce multi-dimensional views of major blood vessels throughout the body. In CT angiography, a contrast material is injected through an IV to help visualize the blood vessels   Follow-Up: Your physician recommends that you schedule a follow-up appointment in: DR. Marlou Porch IN 3 MONTHS  Any Other Special Instructions Will Be Listed Below (If Applicable). Regadenoson injection What is this medicine? REGADENOSON is used to test the heart for coronary artery disease. It is used in patients who can not exercise for their stress test. This medicine may be used for other purposes; ask your health care provider or pharmacist if you have questions. COMMON BRAND NAME(S): Lexiscan What should I tell my health care provider before I take this medicine? They need to know if you have any of these conditions: -heart problems -lung or breathing disease, like asthma or COPD -an unusual or allergic reaction to regadenoson, other medicines, foods, dyes, or preservatives -pregnant or trying to get pregnant -breast-feeding How should I use this medicine? This medicine is for injection into a vein. It is given by a health care professional in a hospital or clinic setting. Talk to your pediatrician regarding the use of this medicine in children. Special care may be needed. Overdosage: If you think you have taken too much of this medicine  contact a poison control center or emergency room at once. NOTE: This medicine is only for you. Do not share this medicine with others. What if I miss a dose? This does not apply. What may interact with this medicine? -caffeine -dipyridamole -guarana -theophylline This list may not describe all possible interactions. Give your health care provider a list of all the medicines, herbs, non-prescription drugs, or dietary supplements you use. Also tell them if you smoke, drink alcohol, or use illegal drugs. Some items may interact with your medicine. What should I watch for while using this medicine? Your condition will be monitored carefully while you are receiving this medicine. Do not take medicines, foods, or drinks with caffeine (like coffee, tea, or colas) for at least 12 hours before your test. If you do not know if something contains caffeine, ask your health care professional. What side effects may I notice from receiving this medicine? Side effects that you should report to your doctor or health care professional as soon as possible: -allergic reactions like skin rash, itching or hives, swelling of the face, lips, or tongue -breathing problems -chest pain, tightness or palpitations -severe headache Side effects that usually do not require medical attention (report to your doctor or health care professional if they continue or are bothersome): -flushing -headache -irritation or pain at site where injected -nausea, vomiting This list may not describe all possible side effects. Call your doctor for medical advice about side effects. You may report side effects to FDA at 1-800-FDA-1088. Where should I keep my medicine? This drug is given in a hospital or clinic and will not be stored at home. NOTE: This sheet is a summary.  It may not cover all possible information. If you have questions about this medicine, talk to your doctor, pharmacist, or health care provider.  2018 Elsevier/Gold  Standard (2008-08-10 15:08:13)   CT Angiogram A CT angiogram is a procedure to look at the blood vessels in various areas of the body. For this procedure, a large X-ray machine, called a CT scanner, takes detailed pictures of blood vessels that have been injected with a dye (contrast material). A CT angiogram allows your health care provider to see how well blood is flowing to the area of your body that is being checked. Your health care provider will be able to see if there are any problems, such as a blockage. Tell a health care provider about:  Any allergies you have.  All medicines you are taking, including vitamins, herbs, eye drops, creams, and over-the-counter medicines.  Any problems you or family members have had with anesthetic medicines.  Any blood disorders you have.  Any surgeries you have had.  Any medical conditions you have.  Whether you are pregnant or may be pregnant.  Whether you are breastfeeding.  Any anxiety disorders, chronic pain, or other conditions you have that may increase your stress or prevent you from lying still. What are the risks? Generally, this is a safe procedure. However, problems may occur, including:  Infection.  Bleeding.  Allergic reactions to medicines or dyes.  Damage to other structures or organs.  Kidney damage from the dye or contrast that is used.  Increased risk of cancer from radiation exposure. This risk is low. Talk with your health care provider about: ? The risks and benefits of testing. ? How you can receive the lowest dose of radiation.  What happens before the procedure?  Wear comfortable clothing and remove any jewelry.  Follow instructions from your health care provider about eating and drinking. For most people, instructions may include these actions: ? For 12 hours before the test, avoid caffeine. This includes tea, coffee, soda, and energy drinks or pills. ? For 3-4 hours before the test, stop eating or  drinking anything but water. ? Stay well hydrated by continuing to drink water before the exam. This will help to clear the contrast dye from your body after the test.  Ask your health care provider about changing or stopping your regular medicines. This is especially important if you are taking diabetes medicines or blood thinners. What happens during the procedure?  An IV tube will be inserted into one of your veins.  You will be asked to lie on an exam table. This table will slide in and out of the CT machine during the procedure.  Contrast dye will be injected into the IV tube. You might feel warm, or you may get a metallic taste in your mouth.  The table that you are lying on will move into the CT machine tunnel for the scan.  The person running the machine will give you instructions while the scans are being done. You may be asked to: ? Keep your arms above your head. ? Hold your breath. ? Stay very still, even if the table is moving.  When the scanning is complete, you will be moved out of the machine.  The IV tube will be removed. The procedure may vary among health care providers and hospitals. What happens after the procedure?  You might feel warm, or you may get a metallic taste in your mouth.  You may be asked to drink water or other  fluids to wash (flush) the contrast material out of your body.  It is up to you to get the results of your procedure. Ask your health care provider, or the department that is doing the procedure, when your results will be ready. Summary  A CT angiogram is a procedure to look at the blood vessels in various areas of the body.  You will need to stay very still during the exam.  You may be asked to drink water or other fluids to wash (flush) the contrast material out of your body after your scan. This information is not intended to replace advice given to you by your health care provider. Make sure you discuss any questions you have with  your health care provider. Document Released: 08/10/2016 Document Revised: 08/10/2016 Document Reviewed: 08/10/2016 Elsevier Interactive Patient Education  [employer].    If you need a refill on your cardiac medications before your next appointment, please call your pharmacy.

## 2017-05-31 ENCOUNTER — Telehealth: Payer: Self-pay | Admitting: Physician Assistant

## 2017-05-31 ENCOUNTER — Ambulatory Visit (INDEPENDENT_AMBULATORY_CARE_PROVIDER_SITE_OTHER): Payer: Medicare Other | Admitting: *Deleted

## 2017-05-31 DIAGNOSIS — I639 Cerebral infarction, unspecified: Secondary | ICD-10-CM | POA: Diagnosis not present

## 2017-05-31 MED ORDER — POTASSIUM CHLORIDE ER 10 MEQ PO TBCR: 10.0000 meq | EXTENDED_RELEASE_TABLET | Freq: Every day | ORAL | 3 refills | Status: DC

## 2017-05-31 MED ORDER — FUROSEMIDE 20 MG PO TABS: 20.0000 mg | ORAL_TABLET | Freq: Every day | ORAL | 3 refills | Status: DC

## 2017-05-31 NOTE — Telephone Encounter (Signed)
-----   Message from Eileen Stanford, PA-C sent at 05/31/2017 12:25 PM EDT ----- BNP on high end of normal range. We can trial a small dose of a diuretic to see if this helps his LE edema. Lets try lasix 20mg  daily and Kdur 10 MeQ daily. Once swelling goes down he can use it as needed (taking potassium every time he takes a lasix).

## 2017-05-31 NOTE — Telephone Encounter (Signed)
New Message ° ° pt verbalized that he is returning call for rn  °

## 2017-06-01 NOTE — Progress Notes (Signed)
Carelink Summary Report / Loop Recorder 

## 2017-06-05 ENCOUNTER — Telehealth (HOSPITAL_COMMUNITY): Payer: Self-pay | Admitting: Radiology

## 2017-06-05 ENCOUNTER — Telehealth (HOSPITAL_COMMUNITY): Payer: Self-pay | Admitting: *Deleted

## 2017-06-05 DIAGNOSIS — N183 Chronic kidney disease, stage 3 (moderate): Secondary | ICD-10-CM | POA: Diagnosis not present

## 2017-06-05 DIAGNOSIS — I480 Paroxysmal atrial fibrillation: Secondary | ICD-10-CM | POA: Diagnosis not present

## 2017-06-05 DIAGNOSIS — M50122 Cervical disc disorder at C5-C6 level with radiculopathy: Secondary | ICD-10-CM | POA: Diagnosis not present

## 2017-06-05 NOTE — Telephone Encounter (Signed)
Patient given instructions for MPI 06-08-17 @845 . AY

## 2017-06-05 NOTE — Telephone Encounter (Signed)
Left message with wife in reference to upcoming appointment scheduled for 06/08/17. Phone number given for a call back so details instructions can be given. Seddrick Flax, Ranae Palms

## 2017-06-08 ENCOUNTER — Ambulatory Visit (HOSPITAL_COMMUNITY): Payer: Medicare Other | Attending: Internal Medicine

## 2017-06-08 DIAGNOSIS — R079 Chest pain, unspecified: Secondary | ICD-10-CM

## 2017-06-08 LAB — MYOCARDIAL PERFUSION IMAGING
LV dias vol: 94 mL (ref 62–150)
LV sys vol: 32 mL
Peak HR: 122 {beats}/min
RATE: 0.26
Rest HR: 105 {beats}/min
SDS: 1
SRS: 3
SSS: 4
TID: 0.99

## 2017-06-08 MED ORDER — TECHNETIUM TC 99M TETROFOSMIN IV KIT
10.1000 | PACK | Freq: Once | INTRAVENOUS | Status: AC | PRN
  Administered 2017-06-08: 10.1 via INTRAVENOUS
  Filled 2017-06-08: qty 11

## 2017-06-08 MED ORDER — TECHNETIUM TC 99M TETROFOSMIN IV KIT
31.2000 | PACK | Freq: Once | INTRAVENOUS | Status: AC | PRN
  Administered 2017-06-08: 31.2 via INTRAVENOUS
  Filled 2017-06-08: qty 32

## 2017-06-08 MED ORDER — REGADENOSON 0.4 MG/5ML IV SOLN
0.4000 mg | Freq: Once | INTRAVENOUS | Status: AC
  Administered 2017-06-08: 0.4 mg via INTRAVENOUS

## 2017-06-11 LAB — CUP PACEART REMOTE DEVICE CHECK
Date Time Interrogation Session: 20180607184426
Implantable Pulse Generator Implant Date: 20160816

## 2017-06-11 NOTE — Progress Notes (Signed)
[facility] summary report received. Battery status OK. Normal device function. No new symptom episodes, tachy episodes, brady, or pause episodes. 4AF 2.4% +Eliquis- 2 w/ ECGs appear AF w/ occ. PVCs. Monthly summary reports and ROV/PRN

## 2017-07-02 ENCOUNTER — Ambulatory Visit (INDEPENDENT_AMBULATORY_CARE_PROVIDER_SITE_OTHER): Payer: Medicare Other | Admitting: *Deleted

## 2017-07-02 DIAGNOSIS — I639 Cerebral infarction, unspecified: Secondary | ICD-10-CM

## 2017-07-03 ENCOUNTER — Other Ambulatory Visit: Payer: Self-pay | Admitting: Cardiology

## 2017-07-03 NOTE — Progress Notes (Signed)
Carelink Summary Report / Loop Recorder 

## 2017-07-10 ENCOUNTER — Other Ambulatory Visit: Payer: Self-pay | Admitting: Cardiothoracic Surgery

## 2017-07-10 DIAGNOSIS — I719 Aortic aneurysm of unspecified site, without rupture: Secondary | ICD-10-CM

## 2017-07-11 LAB — CUP PACEART REMOTE DEVICE CHECK
Date Time Interrogation Session: 20180707195503
Implantable Pulse Generator Implant Date: 20160816

## 2017-07-23 ENCOUNTER — Telehealth: Payer: Self-pay | Admitting: Physician Assistant

## 2017-07-23 NOTE — Telephone Encounter (Signed)
Returned pts call.  He was scheduled for 2 CT's, 1 Katie ordered and then Dr. Nils Pyle. Pt wanted to keep the 1 from Dr. Nils Pyle, so CT 07/30/17, was cancelled, per pt.

## 2017-07-23 NOTE — Telephone Encounter (Signed)
New message   Pt is calling about his CT scheduled for Monday 07/30/17. He also has an appt for a CT ordered by Dr. Prescott Gum on 08/15/17. He wants to know if they are the same?

## 2017-07-28 IMAGING — MR MR CERVICAL SPINE W/O CM
4 of 5 series · 22 of 48 positions shown · non-contrast
Comparison: None.

CLINICAL DATA: Neck pain.

EXAM:
MRI CERVICAL SPINE WITHOUT CONTRAST
TECHNIQUE: Multiplanar, multisequence MR imaging of the cervical spine was
performed. No intravenous contrast was administered.

[Series 4: T2 · sagittal · 3.0mm · 0.47mm/px · 6 of 14 slices shown (1 of 2)]
[im 1/14]
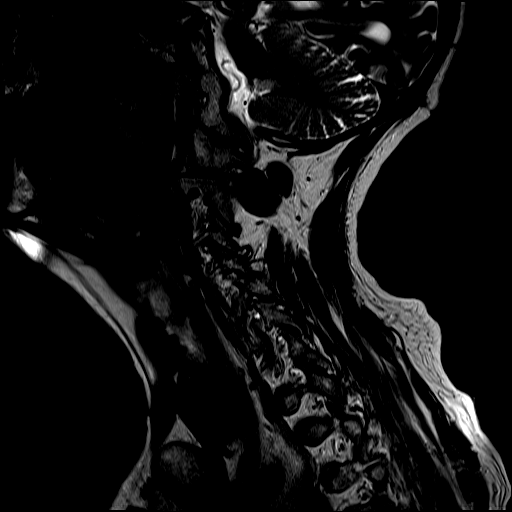
[im 3/14]
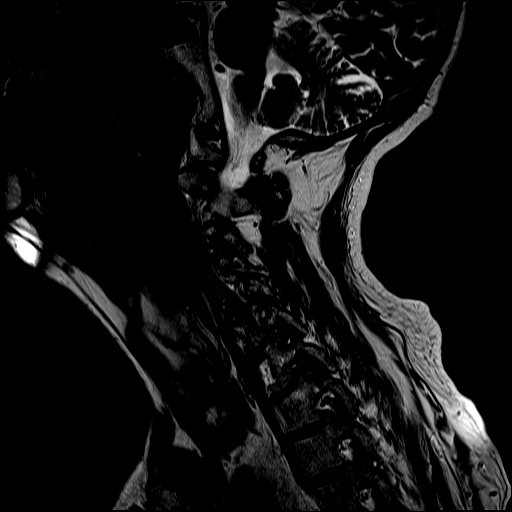
[im 6/14]
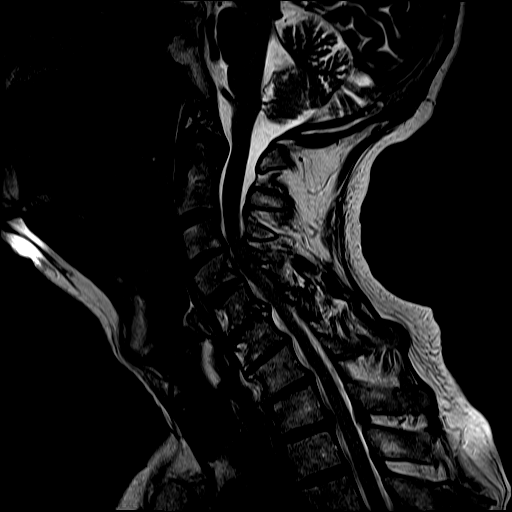
[im 8/14]
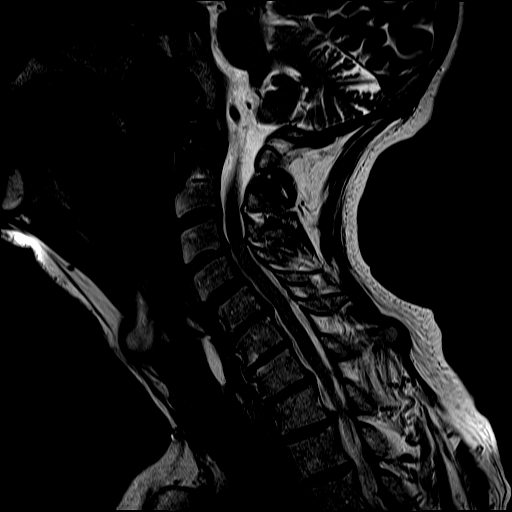
[im 11/14]
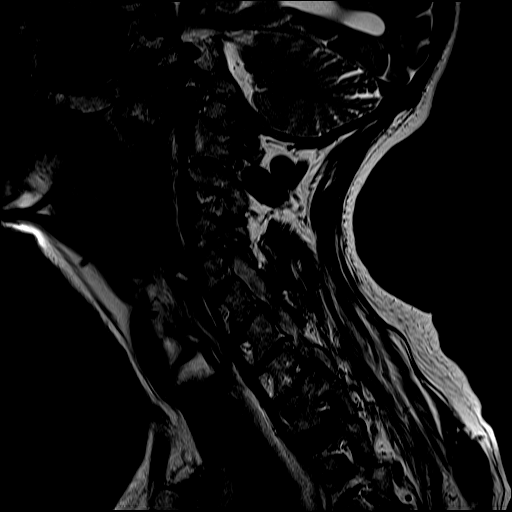
[im 14/14]
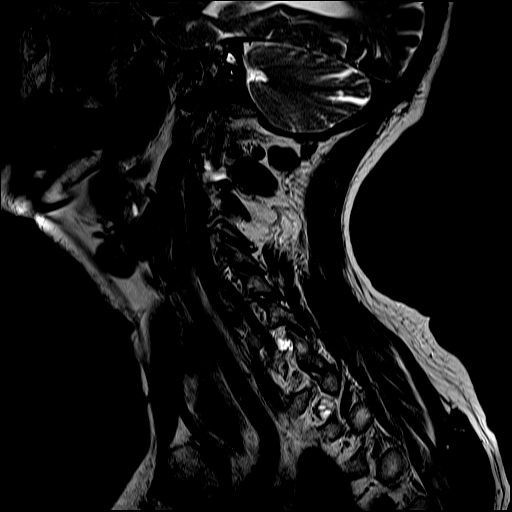

[Series 5: (id) sagital · sagittal · 3.0mm · 0.72mm/px · 4 of 14 slices shown]
[im 1/14]
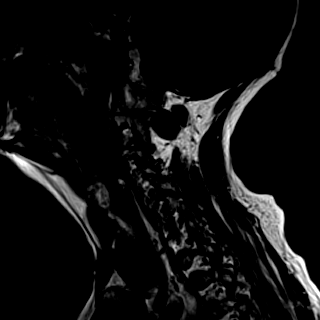
[im 3/14]
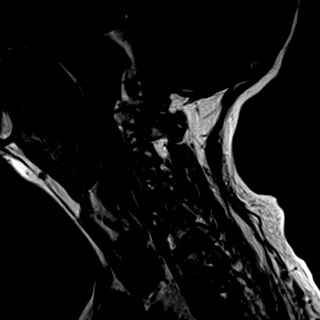
[im 8/14]
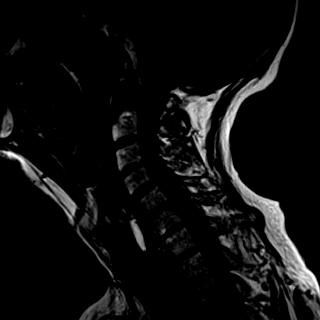
[im 14/14]
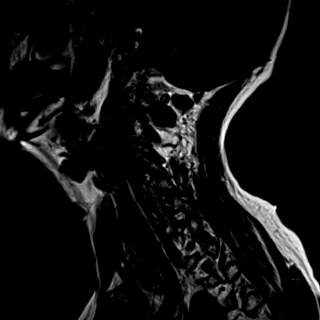

[Series 6: ir sagital · sagittal · 3.0mm · 0.26mm/px · 3 of 14 slices shown]
[im 3/14]
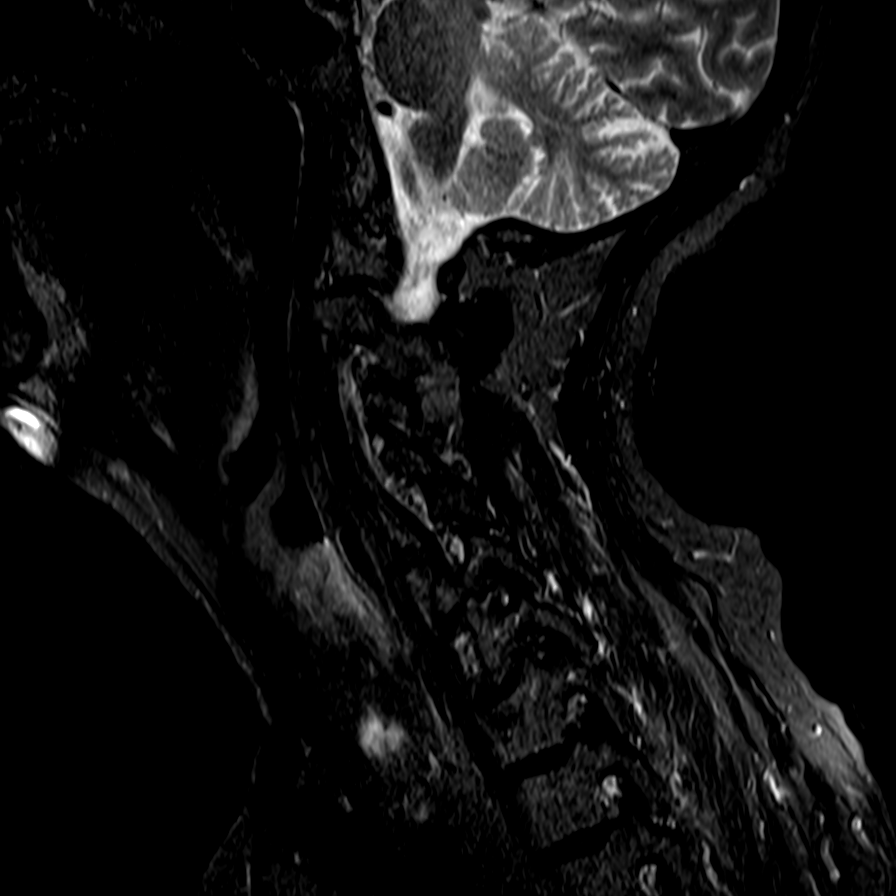
[im 8/14]
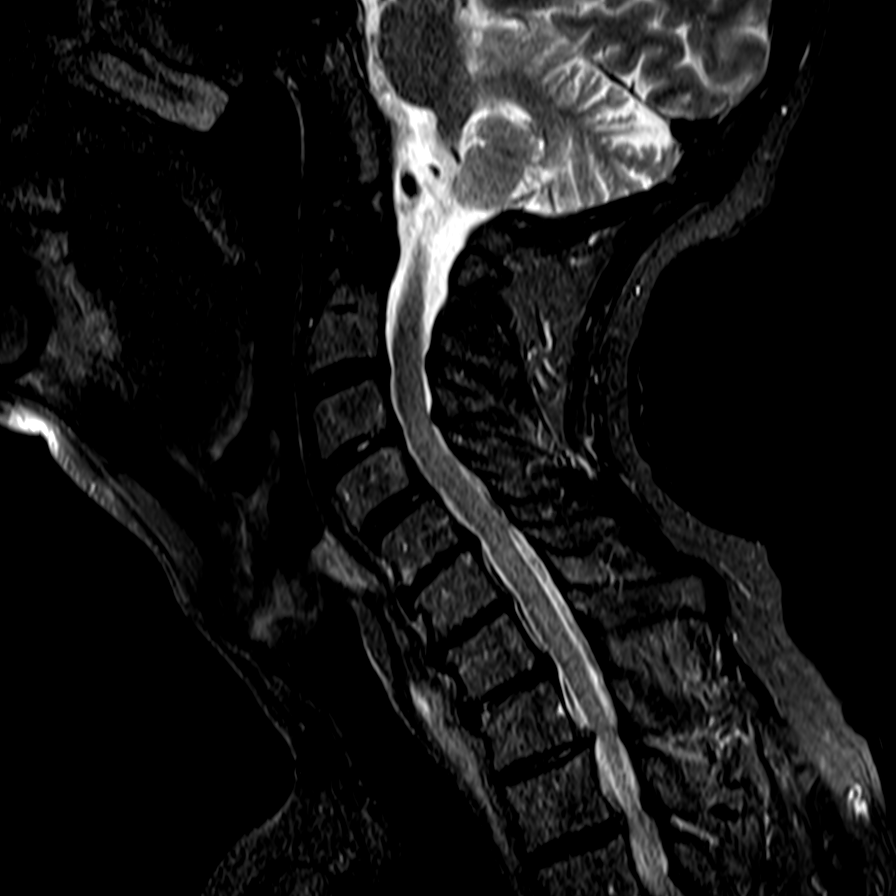
[im 14/14]
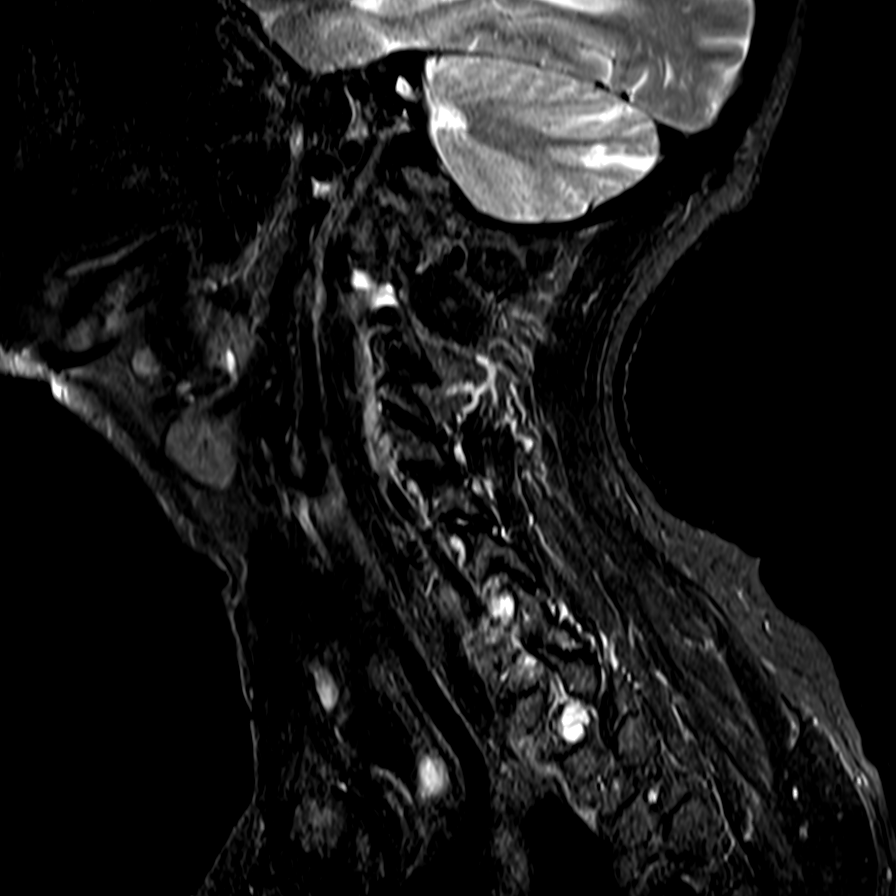

[Series 8: T2 · axial · 3.0mm · 0.45mm/px · z∈[-128,-14]mm · 9 of 38 slices shown (2 of 2)]
[im 1/38]
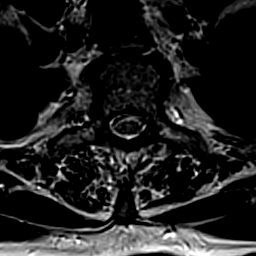
[im 6/38]
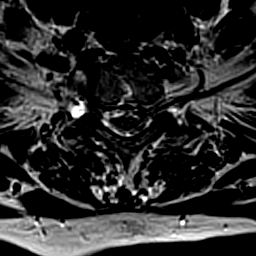
[im 11/38]
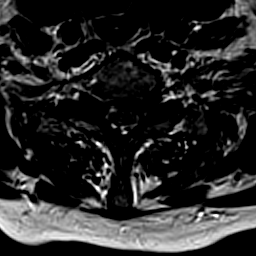
[im 16/38]
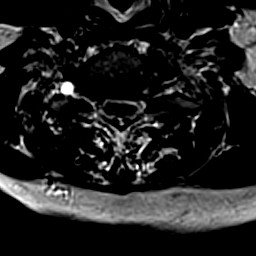
[im 19/38]
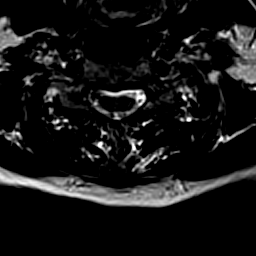
[im 22/38]
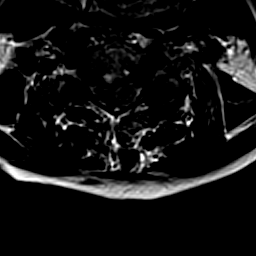
[im 27/38]
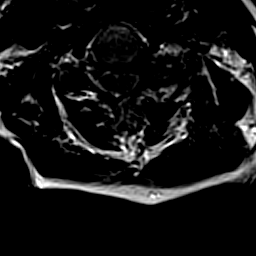
[im 32/38]
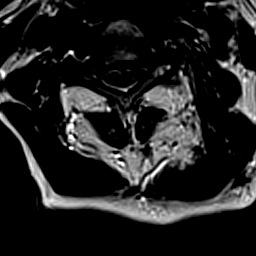
[im 38/38]
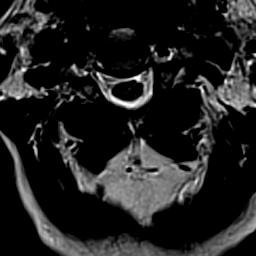

[22 of 48 positions shown; findings below may reference images not displayed]

FINDINGS: Alignment: 2 mm anterolisthesis C4-5 is facet mediated.

Vertebrae: No worrisome osseous lesion. Endplate reactive changes
are seen most notably above and below C5-6.

Cord: Cord flattening most pronounced at C3-4 and C5-6, without
abnormal cord signal.

Posterior Fossa, vertebral arteries, paraspinal tissues:
Unremarkable.

Disc levels:

C2-3: Central protrusion. Mild stenosis with effacement anterior
subarachnoid space. No definite foraminal narrowing.

C3-4: Disc space narrowing. Disc osteophyte complex contributes to
moderate stenosis with cord flattening. Ligamentum flavum
hypertrophy is present along with facet arthropathy and LEFT greater
than RIGHT uncinate spurring. BILATERAL C4 nerve root impingement.
Canal diameter 6 mm.

C4-5: 2 mm anterolisthesis is facet mediated. There is mild annular
bulging. LEFT-sided uncinate spurring and asymmetric facet
arthropathy contribute to LEFT C5 foraminal narrowing. Slight
effacement anterior subarachnoid space without cord flattening.

C5-6: Disc space narrowing. Central protrusion with osseous ridging
BILATERAL uncinate spurring. Moderate cord flattening with a focal
osseous spur. Canal diameter 6-7 mm. BILATERAL C6 foraminal
narrowing.

C6-7: Disc space narrowing. Osseous ridging merges with BILATERAL
uncinate spurs. BILATERAL C7 foraminal narrowing. Mild canal
stenosis with slight effacement anterior subarachnoid space.

C7-T1: Central protrusion extends to the RIGHT greater than LEFT
foramina. BILATERAL facet arthropathy. Either C8 nerve root could be
affected.

T1-T2: Central extrusion. Mild stenosis with slight cord flattening.
BILATERAL facet arthropathy. BILATERAL T1 nerve root impingement is
possible.
IMPRESSION: Multilevel spondylosis as described. Potentially symptomatic neural
impingement at C3-4 through T1-T2. See discussion above.

Spinal stenosis is most pronounced at C3-4 and C5-6 where cord
flattening is observed, without abnormal cord signal.

## 2017-07-30 ENCOUNTER — Other Ambulatory Visit: Payer: Medicare Other

## 2017-07-30 ENCOUNTER — Ambulatory Visit (INDEPENDENT_AMBULATORY_CARE_PROVIDER_SITE_OTHER): Payer: Medicare Other | Admitting: *Deleted

## 2017-07-30 DIAGNOSIS — I639 Cerebral infarction, unspecified: Secondary | ICD-10-CM

## 2017-07-30 DIAGNOSIS — M47816 Spondylosis without myelopathy or radiculopathy, lumbar region: Secondary | ICD-10-CM | POA: Diagnosis not present

## 2017-07-31 ENCOUNTER — Other Ambulatory Visit (HOSPITAL_COMMUNITY): Payer: Self-pay | Admitting: Nurse Practitioner

## 2017-07-31 ENCOUNTER — Ambulatory Visit (HOSPITAL_COMMUNITY)
Admission: RE | Admit: 2017-07-31 | Discharge: 2017-07-31 | Disposition: A | Discharge status: Home / Self Care | Payer: Medicare Other | Source: Ambulatory Visit | Attending: Nurse Practitioner | Admitting: Nurse Practitioner

## 2017-07-31 DIAGNOSIS — M47816 Spondylosis without myelopathy or radiculopathy, lumbar region: Secondary | ICD-10-CM

## 2017-07-31 DIAGNOSIS — Z981 Arthrodesis status: Secondary | ICD-10-CM | POA: Diagnosis not present

## 2017-07-31 DIAGNOSIS — M4316 Spondylolisthesis, lumbar region: Secondary | ICD-10-CM | POA: Insufficient documentation

## 2017-07-31 DIAGNOSIS — Z9889 Other specified postprocedural states: Secondary | ICD-10-CM | POA: Diagnosis not present

## 2017-07-31 NOTE — Progress Notes (Signed)
Carelink Summary Report / Loop Recorder 

## 2017-08-06 ENCOUNTER — Other Ambulatory Visit (HOSPITAL_COMMUNITY): Payer: Self-pay | Admitting: Nurse Practitioner

## 2017-08-06 DIAGNOSIS — M4727 Other spondylosis with radiculopathy, lumbosacral region: Secondary | ICD-10-CM

## 2017-08-07 ENCOUNTER — Other Ambulatory Visit: Payer: Self-pay | Admitting: Cardiology

## 2017-08-07 NOTE — Telephone Encounter (Signed)
Pt saw PA in office on 05/30/17. SCr was at that time 1.18. Pt is 76 yrs old, Wt 96.8Kg. Will refill Eliquis 5mg  BID.

## 2017-08-08 LAB — CUP PACEART REMOTE DEVICE CHECK
Date Time Interrogation Session: 20180806201107
Implantable Pulse Generator Implant Date: 20160816

## 2017-08-14 ENCOUNTER — Ambulatory Visit (HOSPITAL_COMMUNITY)
Admission: RE | Admit: 2017-08-14 | Discharge: 2017-08-14 | Disposition: A | Discharge status: Home / Self Care | Payer: Medicare Other | Source: Ambulatory Visit | Attending: Nurse Practitioner | Admitting: Nurse Practitioner

## 2017-08-14 DIAGNOSIS — M545 Low back pain: Secondary | ICD-10-CM | POA: Diagnosis not present

## 2017-08-14 DIAGNOSIS — M48061 Spinal stenosis, lumbar region without neurogenic claudication: Secondary | ICD-10-CM | POA: Insufficient documentation

## 2017-08-14 DIAGNOSIS — M4727 Other spondylosis with radiculopathy, lumbosacral region: Secondary | ICD-10-CM | POA: Diagnosis not present

## 2017-08-15 ENCOUNTER — Ambulatory Visit
Admission: RE | Admit: 2017-08-15 | Discharge: 2017-08-15 | Disposition: A | Discharge status: Home / Self Care | Payer: Medicare Other | Source: Ambulatory Visit | Attending: Cardiothoracic Surgery | Admitting: Cardiothoracic Surgery

## 2017-08-15 ENCOUNTER — Ambulatory Visit (INDEPENDENT_AMBULATORY_CARE_PROVIDER_SITE_OTHER): Payer: Medicare Other | Admitting: Cardiothoracic Surgery

## 2017-08-15 ENCOUNTER — Encounter: Payer: Self-pay | Admitting: Cardiothoracic Surgery

## 2017-08-15 VITALS — BP 132/73 | HR 63 | Resp 20 | Ht 73.0 in | Wt 214.0 lb

## 2017-08-15 DIAGNOSIS — I712 Thoracic aortic aneurysm, without rupture, unspecified: Secondary | ICD-10-CM

## 2017-08-15 DIAGNOSIS — I639 Cerebral infarction, unspecified: Secondary | ICD-10-CM | POA: Diagnosis not present

## 2017-08-15 DIAGNOSIS — I719 Aortic aneurysm of unspecified site, without rupture: Secondary | ICD-10-CM

## 2017-08-15 MED ORDER — IOPAMIDOL (ISOVUE-370) INJECTION 76%
75.0000 mL | Freq: Once | INTRAVENOUS | Status: AC | PRN
  Administered 2017-08-15: 75 mL via INTRAVENOUS

## 2017-08-17 IMAGING — CR DG CERVICAL SPINE 2 OR 3 VIEWS
2 series · 2 of 2 positions shown · non-contrast
Comparison: [DATE]

CLINICAL DATA: ACDF at C3-C4, C4-C5

EXAM:
CERVICAL SPINE - 2-3 VIEW

[AP (1 of 2)]
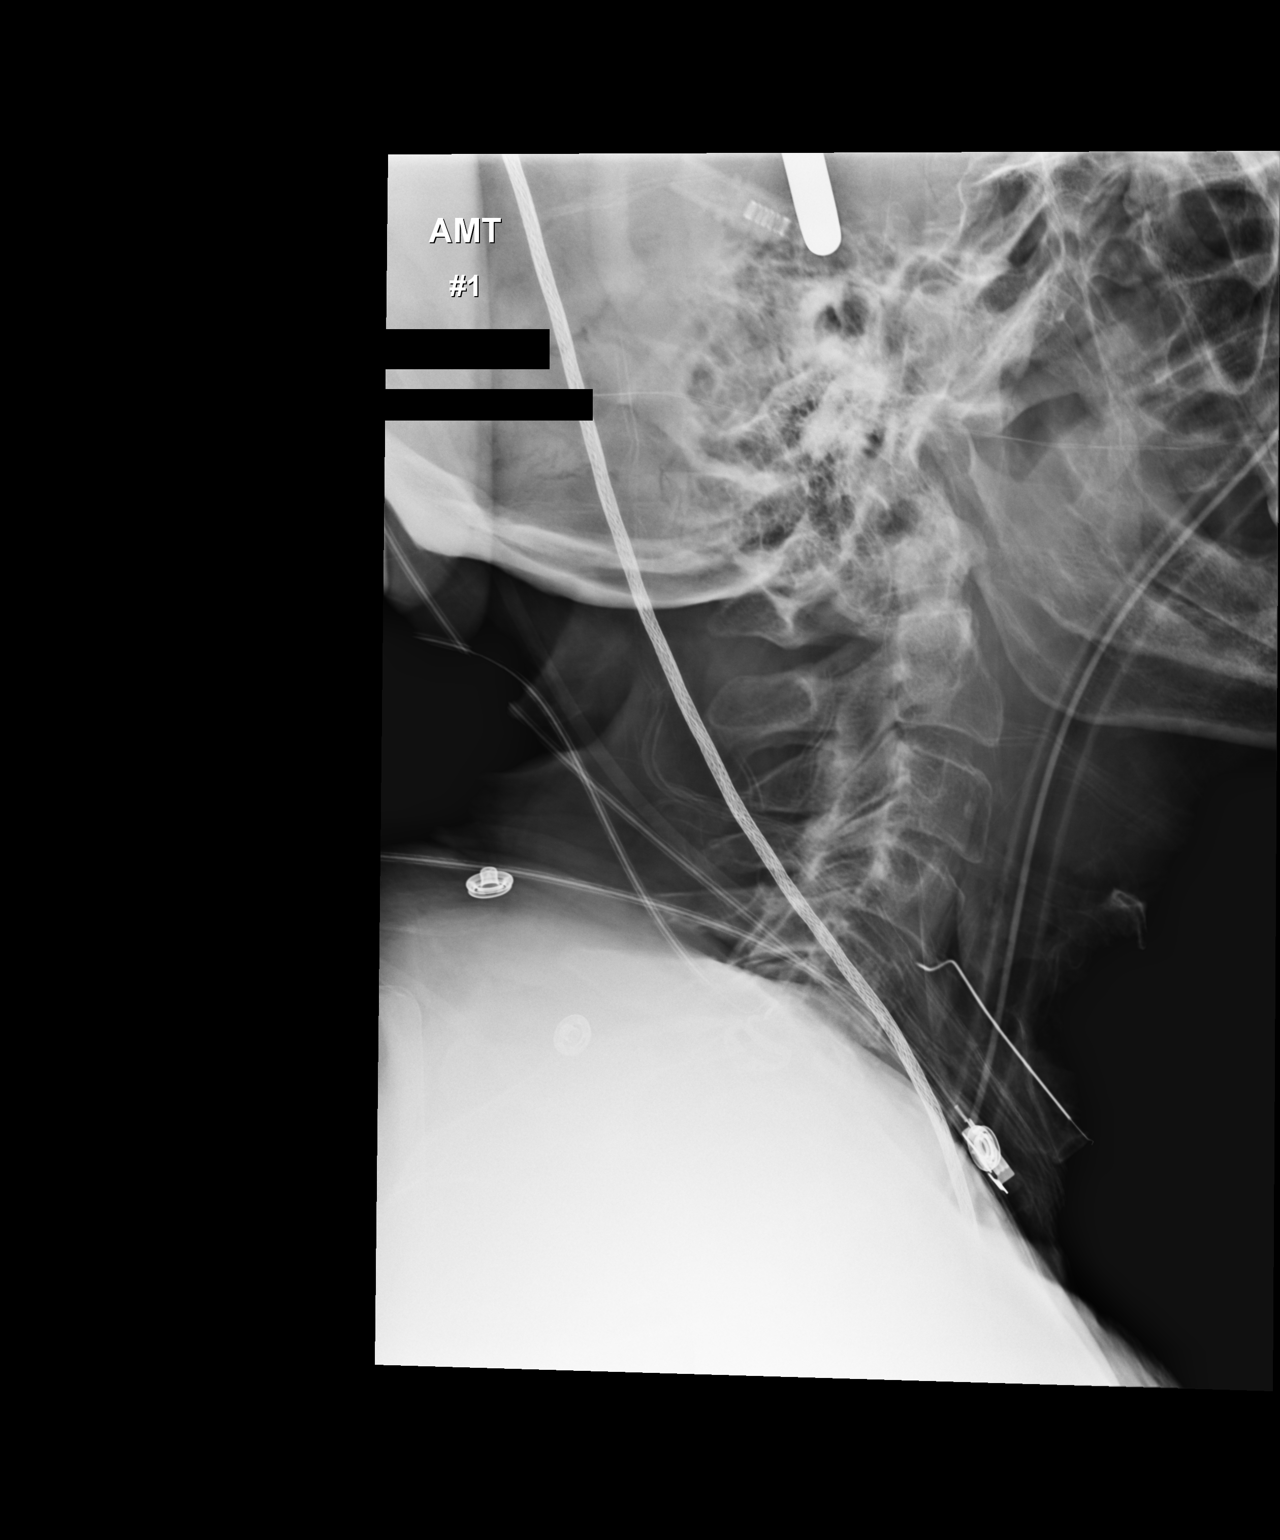

[AP (2 of 2)]
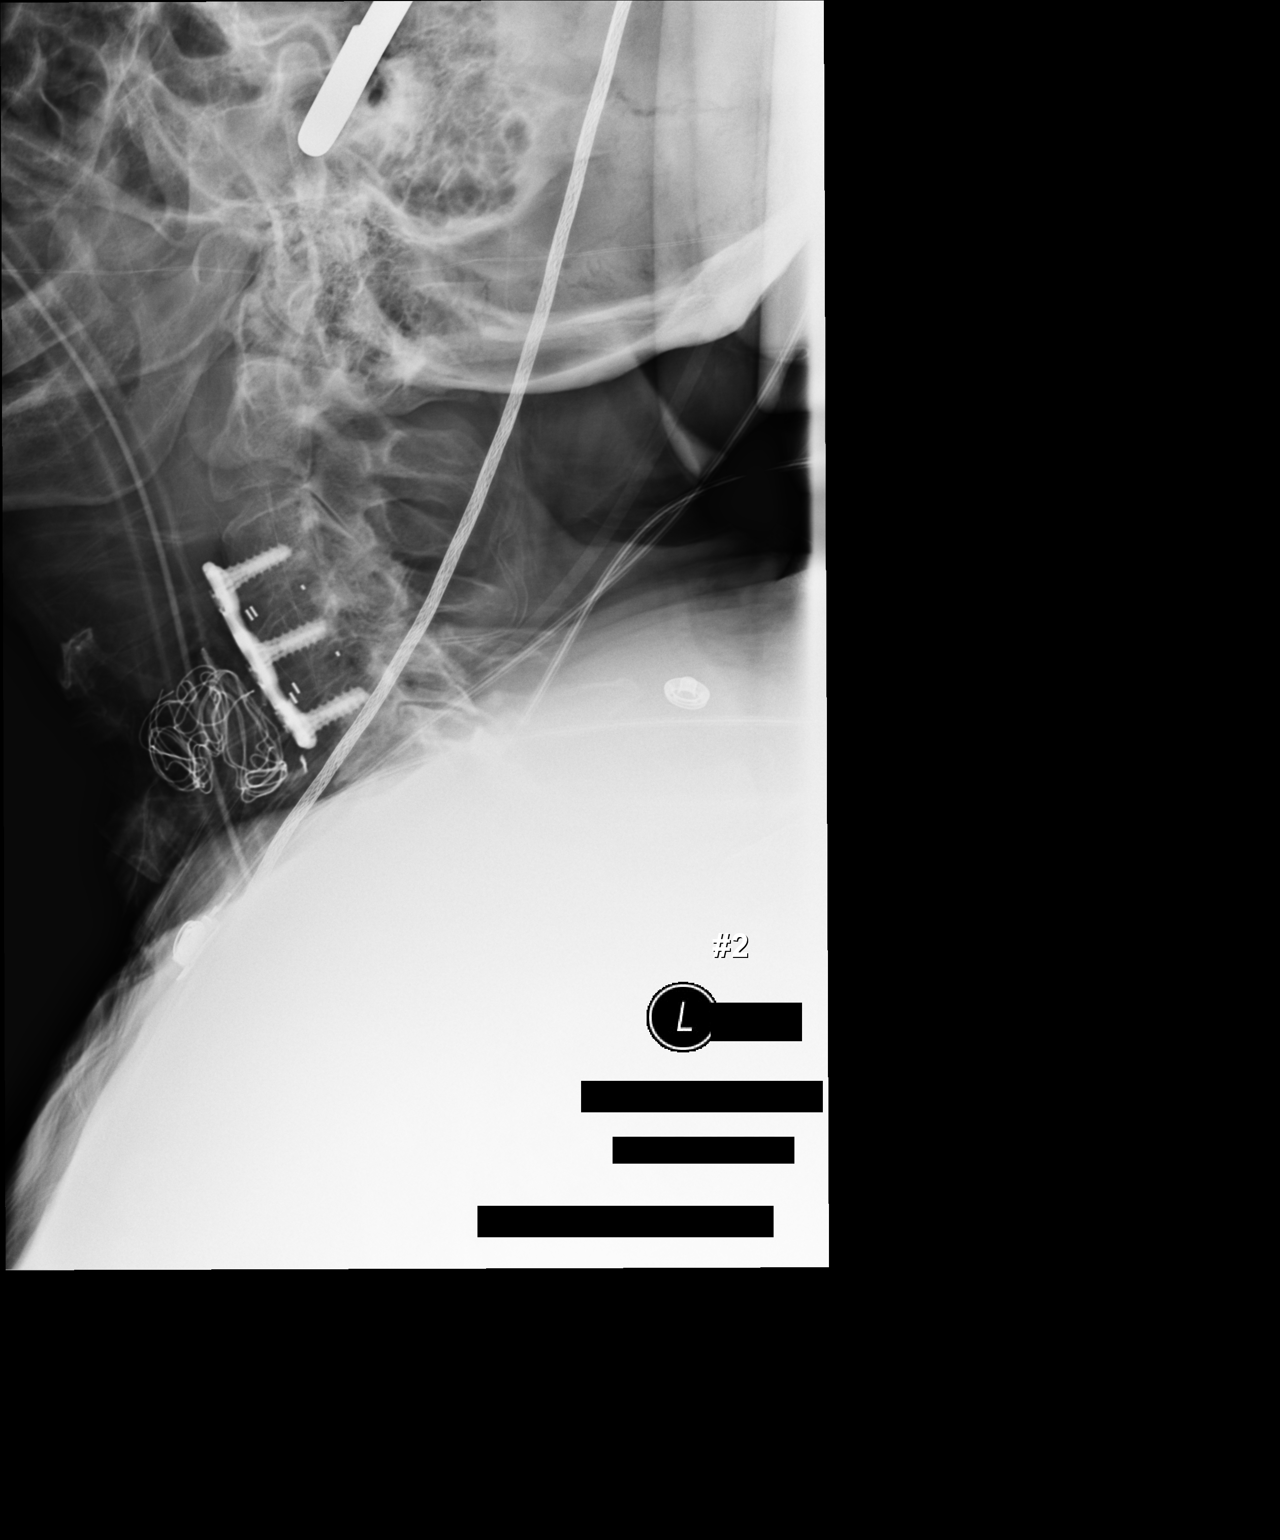

[2 of 2 positions shown; findings below may reference images not displayed]

FINDINGS: Two lateral views of the cervical spine submitted. First view there
is anterior localization needle at C4-C5 disc space. Second view
there is anterior fusion and metallic plate and screws at C3, C4 and
C5 level. There is anatomic alignment. Partially visualized
endotracheal tube.
IMPRESSION: Anterior fusion with metallic plate and screws at C3, C4 and C5
level. There is anatomic alignment.

Please see the operative report

## 2017-08-17 NOTE — Progress Notes (Signed)
PCP is Asencion Noble, MD Referring Provider is Asencion Noble, MD  Chief Complaint  Patient presents with  . 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  The patient is a ex-smoker with COPD, history of atrial arrhythmias on anticoagulation and history of granulomatous lung disease but no at risk lung lesions noted on CT scan of chest.  Echocardiogram 2 years ago demonstrates normal LVEF with no significant aortic valve disease Past Medical History:  Diagnosis Date  . Arthritis    DJD right knee  . Benign prostatic hypertrophy with urinary frequency   . Bladder cancer ([place])   . COPD with emphysema ([place])   . Depression   . Dyspnea    when walking  . Dysrhythmia    a fib  . History of colon polyps   . History of pneumonia    hx Recurrent -- last bout Dec 2016 CAP-- per pt resolved  . 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 Hyperlipidemia   . 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 Nephrolithiasis    right non-obstructive  per CT 05-02-2016  . Ocular migraine    controlled w/ verapamil  . PAF (paroxysmal atrial fibrillation) ([place])    followed by AFIB clinic-- cardiologist-- dr Marlou Porch  . Pneumonia    history of pnu.  . Pulmonary nodule, left    left lower lobe x2 per CT 01-20-2016  . Renal cyst, left   . Thoracic aortic aneurysm without rupture ([place])    ascending -- ct chest 01/10/16  4.8cm  . Wears hearing aid    bilateral-- wear at times    Past Surgical History:  Procedure Laterality Date  . ANTERIOR CERVICAL  DECOMP/DISCECTOMY FUSION N/A 04/03/2017   Procedure: Cervical three-four, Cervical four-five Anterior cervical decompression/discectomy/fusion;  Surgeon: Erline Levine, MD;  Location: [facility];  Service: Neurosurgery;  Laterality: N/A;  . CATARACT EXTRACTION W/ INTRAOCULAR LENS IMPLANT Right 2016  . CATARACT EXTRACTION W/PHACO  12/16/2012   Procedure: CATARACT EXTRACTION PHACO AND INTRAOCULAR LENS PLACEMENT (IOC);  Surgeon: Tonny Branch, MD;  Location: [facility];  Service: Ophthalmology;  Laterality: Left;  CDE:17.31  . COLONOSCOPY  10/03/2011   Procedure: COLONOSCOPY;  Surgeon: Jamesetta So;  Location: [facility];  Service: Gastroenterology;  Laterality: N/A;  . DECOMPRESSIOIN ULNAR NERVE AND CUBITAL TUNNEL RELEASE Left 07-14-2009   elbow  . 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 INGUINAL HERNIA REPAIR Right 1990's  . KNEE ARTHROSCOPY Right 2015  . LEFT CUBITAL TUNNEL RELEASE    . POSTERIOR LUMBAR FUSION  2014  . SHOULDER ARTHROSCOPY Left 1990's  . TEE WITHOUT CARDIOVERSION N/A 07/27/2015   Procedure: TRANSESOPHAGEAL ECHOCARDIOGRAM (TEE);  Surgeon: Lelon Perla, MD;  Location: [facility];  Service: Cardiovascular;  Laterality: N/A;   normal LV function, ef 55-60%,  mild AR and MR, mild dilated ascending aorta (4.2cm),  mild to moderate atherosclerosis  descending aorta,  mild to moderate TR,  negative saline microcavitation study  . THYROIDECTOMY Right  01/20/2015   Procedure: RIGHT THYROIDECTOMY;  Surgeon: Ascencion Dike, MD;  Location: [facility];  Service: ENT;  Laterality: Right;  . TONSILLECTOMY  as child  . TRANSURETHRAL RESECTION OF BLADDER TUMOR N/A 05/15/2016   Procedure: TRANSURETHRAL RESECTION OF BLADDER TUMOR (TURBT) AND INSTILLATION OF EPIRUBICIN;  Surgeon: Franchot Gallo, MD;  Location: [facility];  Service: Urology;  Laterality: N/A;    Family History  Problem Relation Age of Onset  . Stroke Sister   . Breast  cancer Sister   . Stroke Brother     Social History Social History  Substance Use Topics  . Smoking status: Former Smoker    Packs/day: 1.00    Years: 45.00    Types: Cigarettes    Quit date: 09/26/2010  . Smokeless tobacco: Never Used  . Alcohol use No    Current Outpatient Prescriptions  Medication Sig Dispense Refill  . acetaminophen (TYLENOL) 500 MG tablet Take 500 mg by mouth daily.     Marland Kitchen atorvastatin (LIPITOR) 20 MG tablet TAKE 1 TABLET BY MOUTH DAILY. 90 tablet 3  . budesonide-formoterol (SYMBICORT) 160-4.5 MCG/ACT inhaler Inhale 2 puffs into the lungs 2 (two) times daily.    Marland Kitchen ELIQUIS 5 MG TABS tablet TAKE (1) TABLET BY MOUTH TWICE DAILY. 60 tablet 11  . finasteride (PROSCAR) 5 MG tablet Take 5 mg by mouth every morning.     . furosemide (LASIX) 20 MG tablet Take 1 tablet (20 mg total) by mouth daily. 90 tablet 3  . Magnesium 400 MG CAPS Take 400 mg by mouth every morning.     . Multiple Vitamins-Minerals (CENTRUM SILVER PO) Take 1 tablet by mouth every morning.     . potassium chloride (K-DUR) 10 MEQ tablet Take 1 tablet (10 mEq total) by mouth daily. When you take Lasix 90 tablet 3  . sertraline (ZOLOFT) 50 MG tablet Take 50 mg by mouth every morning.     . VENTOLIN HFA 108 (90 BASE) MCG/ACT inhaler Inhale 2 puffs into the lungs Every 4 hours as needed for wheezing or shortness of breath. Reported on 12/23/2015    . verapamil (CALAN-SR) 240 MG CR tablet Take 240 mg by mouth every morning.      No current facility-administered medications for this visit.     Allergies  Allergen Reactions  . Flomax [Tamsulosin Hcl] Nausea And Vomiting and Other (See Comments)    "Pt thought he was dying " DIZZY    Review of Systems  Weight stable Patient denies smoking Minimal shortness of breath with activity or productive cough No abdominal pain or change in bowel habits No lower extremity pain or edema No dizziness or syncope  BP 132/73   Pulse 63   Resp 20   Ht 6\' 1"   (1.854 m)   Wt 214 lb (97.1 kg)   SpO2 93% Comment: RA  BMI 28.23 kg/m  Physical Exam      Exam    General- alert and comfortable   Lungs- clear without rales, wheezes   Cor- regular rate and rhythm, no murmur , gallop   Abdomen- soft, non-tender   Extremities - warm, non-tender, minimal edema   Neuro- oriented, appropriate, no focal weakness   Diagnostic Tests: CT scan images personally reviewed and counseled with patient The fusiform ascending aneurysm is stable at 4.7 cm. There is no mural thickening, hematoma, or ulceration  Impression: Stable moderate ascending fusiform aneurysm with low risk for aortic dissection as long as diameter does not  exceed 5.5 cm. Currently the risk for dissection this individual is less than 2%. Continue medical therapy with blood pressure control, smoking cessation, and heart healthy diet  Plan: Return in one year with CT scan of chest   Len Childs, MD [facility] [phone]

## 2017-08-21 ENCOUNTER — Ambulatory Visit (INDEPENDENT_AMBULATORY_CARE_PROVIDER_SITE_OTHER): Payer: Medicare Other | Admitting: Urology

## 2017-08-21 DIAGNOSIS — C670 Malignant neoplasm of trigone of bladder: Secondary | ICD-10-CM

## 2017-08-29 ENCOUNTER — Ambulatory Visit (INDEPENDENT_AMBULATORY_CARE_PROVIDER_SITE_OTHER): Payer: Medicare Other | Admitting: *Deleted

## 2017-08-29 DIAGNOSIS — Z6827 Body mass index (BMI) 27.0-27.9, adult: Secondary | ICD-10-CM | POA: Diagnosis not present

## 2017-08-29 DIAGNOSIS — M542 Cervicalgia: Secondary | ICD-10-CM | POA: Diagnosis not present

## 2017-08-29 DIAGNOSIS — I639 Cerebral infarction, unspecified: Secondary | ICD-10-CM | POA: Diagnosis not present

## 2017-08-29 DIAGNOSIS — M4712 Other spondylosis with myelopathy, cervical region: Secondary | ICD-10-CM | POA: Diagnosis not present

## 2017-08-29 DIAGNOSIS — M5412 Radiculopathy, cervical region: Secondary | ICD-10-CM | POA: Diagnosis not present

## 2017-08-29 DIAGNOSIS — I10 Essential (primary) hypertension: Secondary | ICD-10-CM | POA: Diagnosis not present

## 2017-08-30 NOTE — Progress Notes (Signed)
Carelink Summary Report / Loop Recorder 

## 2017-08-31 DIAGNOSIS — M47816 Spondylosis without myelopathy or radiculopathy, lumbar region: Secondary | ICD-10-CM | POA: Insufficient documentation

## 2017-09-03 LAB — CUP PACEART REMOTE DEVICE CHECK
Date Time Interrogation Session: 20180905213940
Implantable Pulse Generator Implant Date: 20160816

## 2017-09-05 ENCOUNTER — Ambulatory Visit (HOSPITAL_COMMUNITY): Payer: Medicare Other | Attending: Neurosurgery

## 2017-09-05 ENCOUNTER — Encounter (HOSPITAL_COMMUNITY): Payer: Self-pay

## 2017-09-05 DIAGNOSIS — M6281 Muscle weakness (generalized): Secondary | ICD-10-CM | POA: Diagnosis not present

## 2017-09-05 DIAGNOSIS — Z79899 Other long term (current) drug therapy: Secondary | ICD-10-CM | POA: Diagnosis not present

## 2017-09-05 DIAGNOSIS — M542 Cervicalgia: Secondary | ICD-10-CM | POA: Insufficient documentation

## 2017-09-05 DIAGNOSIS — R29898 Other symptoms and signs involving the musculoskeletal system: Secondary | ICD-10-CM | POA: Diagnosis not present

## 2017-09-05 DIAGNOSIS — I480 Paroxysmal atrial fibrillation: Secondary | ICD-10-CM | POA: Diagnosis not present

## 2017-09-05 DIAGNOSIS — R293 Abnormal posture: Secondary | ICD-10-CM | POA: Diagnosis not present

## 2017-09-05 DIAGNOSIS — N183 Chronic kidney disease, stage 3 (moderate): Secondary | ICD-10-CM | POA: Diagnosis not present

## 2017-09-05 NOTE — Therapy (Signed)
[facility] [place], [place], [address] Phone: [phone]   Fax:  [phone]  Physical Therapy Evaluation  Patient Details  Name: Kyle Owen MRN: [id number] Date of Birth: [birth year] Referring Provider: Peggyann Shoals, MD  Encounter Date: 09/05/2017      PT End of Session - 09/05/17 1533    Visit Number 1   Number of Visits 13   Date for PT Re-Evaluation 09/26/17   Authorization Type Medicare Part A and B (Secondary: [place])   Authorization Time Period 09/05/17 to 10/17/17   Authorization - Visit Number 1   Authorization - Number of Visits 10   PT Start Time 4580   PT Stop Time 1515   PT Time Calculation (min) 47 min   Activity Tolerance Patient tolerated treatment well;No increased pain   Behavior During Therapy WFL for tasks assessed/performed      Past Medical History:  Diagnosis Date  . Arthritis    DJD right knee  . Benign prostatic hypertrophy with urinary frequency   . Bladder cancer ([place])   . COPD with emphysema ([place])   . Depression   . Dyspnea    when walking  . Dysrhythmia    a fib  . History of colon polyps   . History of pneumonia    hx Recurrent -- last bout Dec 2016 CAP-- per pt resolved  . 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 Hyperlipidemia   . 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 Nephrolithiasis    right non-obstructive  per CT 05-02-2016  . Ocular migraine    controlled w/ verapamil  . PAF (paroxysmal atrial fibrillation) ([place])    followed by AFIB clinic-- cardiologist-- dr Marlou Porch  . Pneumonia    history of pnu.  . Pulmonary nodule, left    left lower lobe x2 per CT 01-20-2016  .  Renal cyst, left   . Thoracic aortic aneurysm without rupture ([place])    ascending -- ct chest 01/10/16  4.8cm  . Wears hearing aid    bilateral-- wear at times    Past Surgical History:  Procedure Laterality Date  . ANTERIOR CERVICAL DECOMP/DISCECTOMY FUSION N/A 04/03/2017   Procedure: Cervical three-four, Cervical four-five Anterior cervical decompression/discectomy/fusion;  Surgeon: Erline Levine, MD;  Location: [facility];  Service: Neurosurgery;  Laterality: N/A;  . CATARACT EXTRACTION W/ INTRAOCULAR LENS IMPLANT Right 2016  . CATARACT EXTRACTION W/PHACO  12/16/2012   Procedure: CATARACT EXTRACTION PHACO AND INTRAOCULAR LENS PLACEMENT (IOC);  Surgeon: Tonny Branch, MD;  Location: [facility];  Service: Ophthalmology;  Laterality: Left;  CDE:17.31  . COLONOSCOPY  10/03/2011   Procedure: COLONOSCOPY;  Surgeon: Jamesetta So;  [facility];  Service: Gastroenterology;  Laterality: N/A;  . DECOMPRESSIOIN ULNAR NERVE AND CUBITAL TUNNEL RELEASE Left 07-14-2009   elbow  . 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 INGUINAL HERNIA REPAIR Right 1990's  . KNEE ARTHROSCOPY Right 2015  . LEFT CUBITAL TUNNEL RELEASE    . POSTERIOR LUMBAR FUSION  2014  . SHOULDER ARTHROSCOPY Left 1990's  . TEE WITHOUT CARDIOVERSION N/A 07/27/2015   Procedure: TRANSESOPHAGEAL ECHOCARDIOGRAM (TEE);  Surgeon: Lelon Perla,  MD;  [facility];  Service: Cardiovascular;  Laterality: N/A;   normal LV function, ef 55-60%,  mild AR and MR, mild dilated ascending aorta (4.2cm),  mild to moderate atherosclerosis  descending aorta,  mild to moderate TR,  negative saline microcavitation study  . THYROIDECTOMY Right 01/20/2015   Procedure: RIGHT THYROIDECTOMY;  Surgeon: Ascencion Dike, MD;  Location: [facility];  Service: ENT;  Laterality: Right;  . TONSILLECTOMY  as child  . TRANSURETHRAL RESECTION OF BLADDER TUMOR N/A 05/15/2016   Procedure: TRANSURETHRAL RESECTION OF BLADDER  TUMOR (TURBT) AND INSTILLATION OF EPIRUBICIN;  Surgeon: Franchot Gallo, MD;  Location: [facility];  Service: Urology;  Laterality: N/A;    There were no vitals filed for this visit.       Subjective Assessment - 09/05/17 1432    Subjective Pt states that earlier in the year, he pinched a nerve in his neck and he couldn't move his arms. He had ACDF on 04/03/17 at C3-4 and C4-5. He had HHPT following the surgery. He was cleared for physical activity by his MD. He saw his MD last week who referred him to OPPT for continued issues with his ROM and LUE. He is having numbness in the very tips of his L fingers, and his L arm is still pretty weak. He likes to shoot competitively and he has significant difficulty lifting and holding his gun up. He is R handed. He is having difficulty driving due to lack of ROM. He feels like his grip strength is deficient.    Pertinent History ACDF of C3-4, C4-5 on 04/03/17; arthritis, COPD, h/o bladder cancer, thoracic aortic aneurysm without rupture; motivated to participate with PT   Limitations Lifting;House hold activities   How long can you sit comfortably? about 1-1.5 hours comfortably   How long can you stand comfortably? about 1-1.5 hours comfortably   How long can you walk comfortably? "decreased stamina"   Patient Stated Goals get my L arm right, get back to shooting competitively   Currently in Pain? Yes   Pain Score 3    Pain Location Neck   Pain Orientation Left;Right   Pain Descriptors / Indicators Stabbing   Pain Type Chronic pain   Pain Onset More than a month ago   Pain Frequency Constant   Aggravating Factors  moving it, looking R/L and up, standing walking   Pain Relieving Factors lay flat   Effect of Pain on Daily Activities increases            [facility] PT Assessment - 09/05/17 0001      Assessment   Medical Diagnosis Cervicalgia   Referring Provider Peggyann Shoals, MD   Onset Date/Surgical Date 04/03/17   Next MD  Visit f/u PRN   Prior Therapy yes for hip, HHPT for neck     Balance Screen   Has the patient fallen in the past 6 months No   Has the patient had a decrease in activity level because of a fear of falling?  No   Is the patient reluctant to leave their home because of a fear of falling?  No     Prior Function   Level of Independence Independent;Independent with basic ADLs   Vocation Retired   Leisure shooting, fishing, riding his boat     Cognition   Overall Cognitive Status Within Functional Limits for tasks assessed     Observation/Other Assessments   Focus on Therapeutic Outcomes (FOTO)  53% limitation  Sensation   Light Touch Appears Intact     Posture/Postural Control   Posture/Postural Control Postural limitations   Postural Limitations Rounded Shoulders;Forward head;Increased thoracic kyphosis     ROM / Strength   AROM / PROM / Strength AROM;Strength     AROM   Overall AROM Comments RUE WNL, LUE limited due to weakness but can achieve full ROM with RUE assist   Cervical Flexion 18   Cervical Extension 18   Cervical - Right Side Bend 18   Cervical - Left Side Bend 20   Cervical - Right Rotation 66   Cervical - Left Rotation 41     Strength   Right Shoulder Flexion 4/5   Right Shoulder ABduction 4+/5   Right Shoulder Internal Rotation 5/5   Right Shoulder External Rotation 5/5   Left Shoulder Flexion 4/5   Left Shoulder ABduction 4/5   Left Shoulder Internal Rotation 5/5   Left Shoulder External Rotation 4+/5   Right Elbow Flexion 5/5   Right Elbow Extension 5/5   Left Elbow Flexion 5/5   Left Elbow Extension 5/5   Right Wrist Flexion 5/5   Right Wrist Extension 5/5   Left Wrist Flexion 5/5   Left Wrist Extension 5/5   Right Hand Gross Grasp Functional   Right Hand Grip (lbs) 89   Left Hand Gross Grasp Impaired   Left Hand Grip (lbs) 76     Palpation   Spinal mobility hypomobility throughout cervical and thoracic spine   Palpation comment increased  soft tissue restrictions of subocciptials, cervical paraspinals, thoracic paraspinals, periscapular musculature        Objective measurements completed on examination: See above findings.           [facility] Adult PT Treatment/Exercise - 09/05/17 0001      Exercises   Exercises Neck     Neck Exercises: Seated   Cervical Rotation Both;5 reps   Cervical Rotation Limitations with towel, HEP demo     Neck Exercises: Supine   Neck Retraction 10 reps;5 secs   Neck Retraction Limitations HEP demo                PT Education - 09/05/17 1532    Education provided Yes   Education Details exam findings, POC, HEP   Person(s) Educated Patient   Methods Explanation;Demonstration;Handout   Comprehension Verbalized understanding;Returned demonstration          PT Short Term Goals - 09/05/17 1545      PT SHORT TERM GOAL #1   Title Pt will be independent with HEP and perform consistently in order to promote return to PLOF.   Time 3   Period Weeks   Status New   Target Date 09/26/17     PT SHORT TERM GOAL #2   Title Pt will have improved cervical AROM by 10 deg in all directions in order to demo improved function and maximize his ability to drive.   Time 3   Period Weeks   Status New     PT SHORT TERM GOAL #3   Title Pt will have at least a 1/2 grade improvement in MMT of all muscle groups tested to maximize pt's ability to shoot his gun competitively   Time 3   Period Weeks   Status New           PT Long Term Goals - 09/05/17 1549      PT LONG TERM GOAL #1   Title Pt will have improved  cervical AROM to [facility] in all directions to maximize pt's ability to drive with greater ease.    Time 6   Period Weeks   Status New   Target Date 10/17/17     PT LONG TERM GOAL #2   Title Pt will have improved MMT by 1 grade in all muscle groups tested in order to maximize his ability to shoot his gun competitively.   Time 6   Period Weeks   Status New     PT LONG TERM  GOAL #3   Title Pt will have improved L grip strength to within 5# of the R to demo improved overall function and allow him to lift heavy objects with greater ease.   Time 6   Period Weeks   Status New     PT LONG TERM GOAL #4   Title Pt will report being able to shoot his gun(s) for at least 45 mins without having to take a break in order to demonstrate improved overall strength, endurance, and function.   Time 6   Period Weeks   Status New                Plan - 09/05/17 1533    Clinical Impression Statement Pt is pleasant 76 YO F who presents to OPPT with c/o neck pain and LUE weakness. Pt had ACDF on 04/03/17 of C3-4 and C4-5 due to significant weakness of BUE; since his surgery, his RUE is WNL but he is still having weakness and minor numbness in his L fingertips. Pt currently presents with deficits in cervical AROM, BUE weakness (L>R), L grip strength, posture, and increased soft tissue restrictions of cervical paraspinals, thoracic paraspinals, periscapular musculature, and suboccipitals as well as hypomobility of cervical and thoracic spine. Pt needs skilled PT intervention to address these deficits in order to maximize his overall function and promote return to PLOF.   History and Personal Factors relevant to plan of care: ACDF of C3-4, C4-5 on 04/03/17; arthritis, COPD, h/o bladder cancer, thoracic aortic aneurysm without rupture; motivated to participate with PT   Clinical Presentation Stable   Clinical Presentation due to: cervical AROM, BUE MMT, soft tissue restrictions, hypomobility, deficits in functional strength, abnormal posture   Clinical Decision Making Low   Rehab Potential Fair   PT Frequency 2x / week   PT Duration 6 weeks   PT Treatment/Interventions ADLs/Self Care Home Management;Cryotherapy;Electrical Stimulation;Moist Heat;Traction;Functional mobility training;Therapeutic activities;Therapeutic exercise;Balance training;Neuromuscular re-education;Patient/family  education;Manual techniques;Passive range of motion;Dry needling;Energy conservation;Taping   PT Next Visit Plan review goals and HEP, postural education and strengthening, BUE strengthening, improving general spinal mobility, manual to cervical/thoracic/periscapular musculature   PT Home Exercise Plan eval: supine cervical retractions, cervical rotation with towel    Consulted and Agree with Plan of Care Patient      Patient will benefit from skilled therapeutic intervention in order to improve the following deficits and impairments:  Decreased activity tolerance, Decreased range of motion, Decreased strength, Hypomobility, Increased muscle spasms, Increased fascial restricitons, Impaired sensation, Impaired UE functional use, Improper body mechanics, Postural dysfunction  Visit Diagnosis: Cervicalgia - Plan: PT plan of care cert/re-cert  Muscle weakness (generalized) - Plan: PT plan of care cert/re-cert  Abnormal posture - Plan: PT plan of care cert/re-cert  Other symptoms and signs involving the musculoskeletal system - Plan: PT plan of care cert/re-cert      G-Codes - 99/24/26 1556    Functional Assessment Tool Used (Outpatient Only) FOTO, clinical judgement, MMT, AROM, posture  Functional Limitation Carrying, moving and handling objects   Carrying, Moving and Handling Objects Current Status [phone]) At least 40 percent but less than 60 percent impaired, limited or restricted   Carrying, Moving and Handling Objects Goal Status (C3403) At least 1 percent but less than 20 percent impaired, limited or restricted       Problem List Patient Active Problem List   Diagnosis Date Noted  . Cervical myelopathy ([place]) 04/03/2017  . Thoracic ascending aortic aneurysm ([place]) 05/09/2016  . Thoracic aortic aneurysm without rupture ([place]) 01/17/2016  . Paroxysmal atrial fibrillation ([place]) 12/17/2015  . S/P partial thyroidectomy 01/20/2015       Geraldine Solar PT, DPT  [facility] [address] [place], [place], [address] Phone: [phone]   Fax:  [phone]  Name: Kyle Owen MRN: [id number] Date of Birth: 1941-07-08

## 2017-09-05 NOTE — Patient Instructions (Signed)
  CERVICAL CHIN TUCK  AND RETRACTION - SUPINE WITH TOWEL  While lying on your back with a small folded up towel under your head, tuck your chin towards your chest. Also, focus on putting pressure on the towel with the back of your head.   Maintain contact of head with the towel the entire time.   Perform 1-2x/day, 2-3 sets of 10 reps holding for 5 seconds   CERVICAL TOWEL ROTATION STRETCH  Hold the ends of a small folded bath towel and wrap it around your head and neck as shown. Place the towel on your face so as to minimize placing pressure on your jaw. Pressure should be placed on the side of your face/cheek bone.   Use your bottom most arm to anchor the towel in place. Use your top most arm to pull the towel to cause a gentle rotational stretch in your neck. Hold, then return to starting position and repeat.   Perform 1-2x/day, 2-3 sets of 10 reps holding for 5 seconds

## 2017-09-06 ENCOUNTER — Other Ambulatory Visit: Payer: Self-pay | Admitting: Nurse Practitioner

## 2017-09-06 DIAGNOSIS — M47816 Spondylosis without myelopathy or radiculopathy, lumbar region: Secondary | ICD-10-CM

## 2017-09-07 ENCOUNTER — Telehealth (HOSPITAL_COMMUNITY): Payer: Self-pay

## 2017-09-07 ENCOUNTER — Ambulatory Visit (HOSPITAL_COMMUNITY): Payer: Medicare Other

## 2017-09-07 DIAGNOSIS — J441 Chronic obstructive pulmonary disease with (acute) exacerbation: Secondary | ICD-10-CM | POA: Diagnosis not present

## 2017-09-07 DIAGNOSIS — Z23 Encounter for immunization: Secondary | ICD-10-CM | POA: Diagnosis not present

## 2017-09-07 NOTE — Telephone Encounter (Signed)
Pt is not feeling well and will go see Dr Willey Blade today.

## 2017-09-10 DIAGNOSIS — M4696 Unspecified inflammatory spondylopathy, lumbar region: Secondary | ICD-10-CM | POA: Diagnosis not present

## 2017-09-10 DIAGNOSIS — M47816 Spondylosis without myelopathy or radiculopathy, lumbar region: Secondary | ICD-10-CM | POA: Diagnosis not present

## 2017-09-12 ENCOUNTER — Encounter (HOSPITAL_COMMUNITY): Payer: Self-pay

## 2017-09-12 ENCOUNTER — Ambulatory Visit (HOSPITAL_COMMUNITY): Payer: Medicare Other

## 2017-09-12 ENCOUNTER — Telehealth: Payer: Self-pay | Admitting: Cardiology

## 2017-09-12 DIAGNOSIS — R29898 Other symptoms and signs involving the musculoskeletal system: Secondary | ICD-10-CM

## 2017-09-12 DIAGNOSIS — R293 Abnormal posture: Secondary | ICD-10-CM

## 2017-09-12 DIAGNOSIS — M6281 Muscle weakness (generalized): Secondary | ICD-10-CM

## 2017-09-12 DIAGNOSIS — M542 Cervicalgia: Secondary | ICD-10-CM

## 2017-09-12 NOTE — Telephone Encounter (Signed)
New message       [facility] Pre-operative Risk Assessment    Request for surgical clearance:  1. What type of surgery is being performed? Facet Injection  2. When is this surgery scheduled? Waiting to schedule    3. Are there any medications that need to be held prior to surgery and how long? Eliquis  4. Name of physician performing surgery? Dr. Carloyn Manner   5. What is your office phone and fax number? [place] 09/12/2017, 2:40 PM  _________________________________________________________________   (provider comments below)

## 2017-09-12 NOTE — Therapy (Signed)
[facility] [place], [place], [address] Phone: [phone]   Fax:  [phone]  Physical Therapy Treatment  Patient Details  Name: Kyle Owen MRN: [id number] Date of Birth: [birth year] Referring Provider: Peggyann Shoals, MD  Encounter Date: 09/12/2017      PT End of Session - 09/12/17 0823    Visit Number 2   Number of Visits 13   Date for PT Re-Evaluation 09/26/17   Authorization Type Medicare Part A and B (Secondary: [place])   Authorization Time Period 09/05/17 to 10/17/17   Authorization - Visit Number 2   Authorization - Number of Visits 10   PT Start Time 0818   PT Stop Time 0109   PT Time Calculation (min) 40 min   Activity Tolerance Patient tolerated treatment well;No increased pain   Behavior During Therapy WFL for tasks assessed/performed      Past Medical History:  Diagnosis Date  . Arthritis    DJD right knee  . Benign prostatic hypertrophy with urinary frequency   . Bladder cancer ([place])   . COPD with emphysema ([place])   . Depression   . Dyspnea    when walking  . Dysrhythmia    a fib  . History of colon polyps   . History of pneumonia    hx Recurrent -- last bout Dec 2016 CAP-- per pt resolved  . 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 Hyperlipidemia   . 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 Nephrolithiasis    right non-obstructive  per CT 05-02-2016  . Ocular migraine    controlled w/ verapamil  . PAF (paroxysmal atrial fibrillation) ([place])    followed by AFIB clinic-- cardiologist-- dr Marlou Porch  . Pneumonia    history of pnu.  . Pulmonary nodule, left    left lower lobe x2 per CT 01-20-2016  .  Renal cyst, left   . Thoracic aortic aneurysm without rupture ([place])    ascending -- ct chest 01/10/16  4.8cm  . Wears hearing aid    bilateral-- wear at times    Past Surgical History:  Procedure Laterality Date  . ANTERIOR CERVICAL DECOMP/DISCECTOMY FUSION N/A 04/03/2017   Procedure: Cervical three-four, Cervical four-five Anterior cervical decompression/discectomy/fusion;  Surgeon: Erline Levine, MD;  Location: [facility];  Service: Neurosurgery;  Laterality: N/A;  . CATARACT EXTRACTION W/ INTRAOCULAR LENS IMPLANT Right 2016  . CATARACT EXTRACTION W/PHACO  12/16/2012   Procedure: CATARACT EXTRACTION PHACO AND INTRAOCULAR LENS PLACEMENT (IOC);  Surgeon: Tonny Branch, MD;  [facility];  Service: Ophthalmology;  Laterality: Left;  CDE:17.31  . COLONOSCOPY  10/03/2011   Procedure: COLONOSCOPY;  Surgeon: Jamesetta So;  [facility];  Service: Gastroenterology;  Laterality: N/A;  . DECOMPRESSIOIN ULNAR NERVE AND CUBITAL TUNNEL RELEASE Left 07-14-2009   elbow  . 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 INGUINAL HERNIA REPAIR Right 1990's  . KNEE ARTHROSCOPY Right 2015  . LEFT CUBITAL TUNNEL RELEASE    . POSTERIOR LUMBAR FUSION  2014  . SHOULDER ARTHROSCOPY Left 1990's  . TEE WITHOUT CARDIOVERSION N/A 07/27/2015   Procedure: TRANSESOPHAGEAL ECHOCARDIOGRAM (TEE);  Surgeon: Lelon Perla,  MD;  [facility];  Service: Cardiovascular;  Laterality: N/A;   normal LV function, ef 55-60%,  mild AR and MR, mild dilated ascending aorta (4.2cm),  mild to moderate atherosclerosis  descending aorta,  mild to moderate TR,  negative saline microcavitation study  . THYROIDECTOMY Right 01/20/2015   Procedure: RIGHT THYROIDECTOMY;  Surgeon: Ascencion Dike, MD;  Location: [facility];  Service: ENT;  Laterality: Right;  . TONSILLECTOMY  as child  . TRANSURETHRAL RESECTION OF BLADDER TUMOR N/A 05/15/2016   Procedure: TRANSURETHRAL RESECTION OF BLADDER  TUMOR (TURBT) AND INSTILLATION OF EPIRUBICIN;  Surgeon: Franchot Gallo, MD;  Location: [facility];  Service: Urology;  Laterality: N/A;    There were no vitals filed for this visit.      Subjective Assessment - 09/12/17 0821    Subjective Pt reports he went to [facility] on Friday concerning upper respiratory infection, is currently on prednisones.  No reports of pain currently.   Pertinent History ACDF of C3-4, C4-5 on 04/03/17; arthritis, COPD, h/o bladder cancer, thoracic aortic aneurysm without rupture; motivated to participate with PT   Currently in Pain? No/denies                         [facility] Adult PT Treatment/Exercise - 09/12/17 0001      Neck Exercises: Seated   Neck Retraction 10 reps;5 secs   Cervical Rotation Both;10 reps   Cervical Rotation Limitations with towel   Postural Training Educated importance of posture    Other Seated Exercise scapular retraction 105"     Neck Exercises: Supine   Neck Retraction 10 reps;5 secs   Shoulder Flexion Both;10 reps     Manual Therapy   Manual Therapy Soft tissue mobilization   Manual therapy comments separate rest of session    Soft tissue mobilization Supine position; STM to upper traps and periscapular musculature                PT Education - 09/12/17 0828    Education provided Yes   Education Details Reviewed goals, assured compliance wiht HEP and copy of eval given to pt   Person(s) Educated Patient   Methods Explanation;Demonstration;Handout   Comprehension Verbalized understanding;Returned demonstration;Need further instruction;Tactile cues required          PT Short Term Goals - 09/05/17 1545      PT SHORT TERM GOAL #1   Title Pt will be independent with HEP and perform consistently in order to promote return to PLOF.   Time 3   Period Weeks   Status New   Target Date 09/26/17     PT SHORT TERM GOAL #2   Title Pt will have improved cervical AROM by 10 deg in all  directions in order to demo improved function and maximize his ability to drive.   Time 3   Period Weeks   Status New     PT SHORT TERM GOAL #3   Title Pt will have at least a 1/2 grade improvement in MMT of all muscle groups tested to maximize pt's ability to shoot his gun competitively   Time 3   Period Weeks   Status New           PT Long Term Goals - 09/05/17 1549      PT LONG TERM GOAL #1   Title Pt will have improved cervical AROM to [facility] in all directions to maximize pt's ability to drive with greater  ease.    Time 6   Period Weeks   Status New   Target Date 10/17/17     PT LONG TERM GOAL #2   Title Pt will have improved MMT by 1 grade in all muscle groups tested in order to maximize his ability to shoot his gun competitively.   Time 6   Period Weeks   Status New     PT LONG TERM GOAL #3   Title Pt will have improved L grip strength to within 5# of the R to demo improved overall function and allow him to lift heavy objects with greater ease.   Time 6   Period Weeks   Status New     PT LONG TERM GOAL #4   Title Pt will report being able to shoot his gun(s) for at least 45 mins without having to take a break in order to demonstrate improved overall strength, endurance, and function.   Time 6   Period Weeks   Status New               Plan - 09/12/17 8850    Clinical Impression Statement Reviewed goals, assured compliance iwth HEP and copy of eval given to pt.  Pt educated importance of posture for pain control.  Therex focus on postural strengthening and cervical mobility.  Cueing for proper form and musculature activation with cervical retraction.  EOS with manual soft tissue mobilizaiton to address cervical musculature restriciton, noted improved rotation following and no reports of pain.  Pt.'s main concern through session with Lt UE weakness.     Rehab Potential Fair   PT Frequency 2x / week   PT Duration 6 weeks   PT Treatment/Interventions ADLs/Self  Care Home Management;Cryotherapy;Electrical Stimulation;Moist Heat;Traction;Functional mobility training;Therapeutic activities;Therapeutic exercise;Balance training;Neuromuscular re-education;Patient/family education;Manual techniques;Passive range of motion;Dry needling;Energy conservation;Taping   PT Next Visit Plan Next session progress cervical and scapular retraction strengthening.  Begin UE flexion at wall; Postural education and strengthening, BUE strengthening, improving general spinal mobility, manual to cervical/thoracic/periscapular musculature   PT Home Exercise Plan eval: supine cervical retractions, cervical rotation with towel       Patient will benefit from skilled therapeutic intervention in order to improve the following deficits and impairments:  Decreased activity tolerance, Decreased range of motion, Decreased strength, Hypomobility, Increased muscle spasms, Increased fascial restricitons, Impaired sensation, Impaired UE functional use, Improper body mechanics, Postural dysfunction  Visit Diagnosis: Cervicalgia  Muscle weakness (generalized)  Abnormal posture  Other symptoms and signs involving the musculoskeletal system     Problem List Patient Active Problem List   Diagnosis Date Noted  . Cervical myelopathy ([place]) 04/03/2017  . Thoracic ascending aortic aneurysm ([place]) 05/09/2016  . Thoracic aortic aneurysm without rupture ([place]) 01/17/2016  . Paroxysmal atrial fibrillation ([place]) 12/17/2015  . S/P partial thyroidectomy 01/20/2015   Kyle Owen, [place]; [place]  Aldona Lento 09/12/2017, 9:06 AM  [place], [place], [address] Phone: [phone]   Fax:  [phone]  Name: Kyle Owen MRN: [id number] Date of Birth: 19-Mar-1941

## 2017-09-12 NOTE — Telephone Encounter (Signed)
Clarified with office that injection is near the spine. Pt takes Eliquis for afib with CHADS2 score of 3 (age, cryptogenic stroke) and CHADS2VASc of 68 (age x2, stroke). Typically hold Eliquis for 3 days prior to spinal related procedures, however given history of stroke will defer to MD. It does look as though stroke occurred prior to discovery of afib and initiation of Eliquis.

## 2017-09-12 NOTE — Telephone Encounter (Signed)
Will forward to CVRR. 

## 2017-09-13 NOTE — Telephone Encounter (Signed)
Clearance faxed to Dr Roy's office with recommendation to hold Eliquis for 3 days prior to injection.

## 2017-09-13 NOTE — Telephone Encounter (Signed)
Hold Eliquis for 3 days prior to injection if he wishes to proceed with injection. Patient is aware that this increases his risk of stroke. If he does not hold for three days, he could develop severe bleeding in spinal cord that could lead to paralysis.   Candee Furbish, MD

## 2017-09-17 ENCOUNTER — Ambulatory Visit (HOSPITAL_COMMUNITY): Payer: Medicare Other | Admitting: Physical Therapy

## 2017-09-17 DIAGNOSIS — R293 Abnormal posture: Secondary | ICD-10-CM

## 2017-09-17 DIAGNOSIS — M6281 Muscle weakness (generalized): Secondary | ICD-10-CM | POA: Diagnosis not present

## 2017-09-17 DIAGNOSIS — R29898 Other symptoms and signs involving the musculoskeletal system: Secondary | ICD-10-CM

## 2017-09-17 DIAGNOSIS — M542 Cervicalgia: Secondary | ICD-10-CM

## 2017-09-17 NOTE — Therapy (Signed)
[facility] [place], [place], [address] Phone: [phone]   Fax:  [phone]  Physical Therapy Treatment  Patient Details  Name: Kyle Owen MRN: [id number] Date of Birth: 1941/06/01 Referring Provider: Peggyann Shoals, MD  Encounter Date: 09/17/2017      PT End of Session - 09/17/17 0859    Visit Number 3   Number of Visits 13   Date for PT Re-Evaluation 09/26/17   Authorization Type Medicare Part A and B (Secondary: [place])   Authorization Time Period 09/05/17 to 10/17/17   Authorization - Visit Number 3   Authorization - Number of Visits 10   PT Start Time 0815   PT Stop Time 0900   PT Time Calculation (min) 45 min   Activity Tolerance Patient tolerated treatment well;No increased pain   Behavior During Therapy WFL for tasks assessed/performed      Past Medical History:  Diagnosis Date  . Arthritis    DJD right knee  . Benign prostatic hypertrophy with urinary frequency   . Bladder cancer ([place])   . COPD with emphysema ([place])   . Depression   . Dyspnea    when walking  . Dysrhythmia    a fib  . History of colon polyps   . History of pneumonia    hx Recurrent -- last bout Dec 2016 CAP-- per pt resolved  . 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 Hyperlipidemia   . 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 Nephrolithiasis    right non-obstructive  per CT 05-02-2016  . Ocular migraine    controlled w/ verapamil  . PAF (paroxysmal atrial fibrillation) ([place])    followed by AFIB clinic-- cardiologist-- dr Marlou Porch  . Pneumonia    history of pnu.  . Pulmonary nodule, left    left lower lobe x2 per CT 01-20-2016  .  Renal cyst, left   . Thoracic aortic aneurysm without rupture ([place])    ascending -- ct chest 01/10/16  4.8cm  . Wears hearing aid    bilateral-- wear at times    Past Surgical History:  Procedure Laterality Date  . ANTERIOR CERVICAL DECOMP/DISCECTOMY FUSION N/A 04/03/2017   Procedure: Cervical three-four, Cervical four-five Anterior cervical decompression/discectomy/fusion;  Surgeon: Erline Levine, MD;  Location: [facility];  Service: Neurosurgery;  Laterality: N/A;  . CATARACT EXTRACTION W/ INTRAOCULAR LENS IMPLANT Right 2016  . CATARACT EXTRACTION W/PHACO  12/16/2012   Procedure: CATARACT EXTRACTION PHACO AND INTRAOCULAR LENS PLACEMENT (IOC);  Surgeon: Tonny Branch, MD;  [facility];  Service: Ophthalmology;  Laterality: Left;  CDE:17.31  . COLONOSCOPY  10/03/2011   Procedure: COLONOSCOPY;  Surgeon: Jamesetta So;  [facility];  Service: Gastroenterology;  Laterality: N/A;  . DECOMPRESSIOIN ULNAR NERVE AND CUBITAL TUNNEL RELEASE Left 07-14-2009   elbow  . 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 INGUINAL HERNIA REPAIR Right 1990's  . KNEE ARTHROSCOPY Right 2015  . LEFT CUBITAL TUNNEL RELEASE    . POSTERIOR LUMBAR FUSION  2014  . SHOULDER ARTHROSCOPY Left 1990's  . TEE WITHOUT CARDIOVERSION N/A 07/27/2015   Procedure: TRANSESOPHAGEAL ECHOCARDIOGRAM (TEE);  Surgeon: Lelon Perla,  MD;  [facility];  Service: Cardiovascular;  Laterality: N/A;   normal LV function, ef 55-60%,  mild AR and MR, mild dilated ascending aorta (4.2cm),  mild to moderate atherosclerosis  descending aorta,  mild to moderate TR,  negative saline microcavitation study  . THYROIDECTOMY Right 01/20/2015   Procedure: RIGHT THYROIDECTOMY;  Surgeon: Ascencion Dike, MD;  Location: [facility];  Service: ENT;  Laterality: Right;  . TONSILLECTOMY  as child  . TRANSURETHRAL RESECTION OF BLADDER TUMOR N/A 05/15/2016   Procedure: TRANSURETHRAL RESECTION OF BLADDER  TUMOR (TURBT) AND INSTILLATION OF EPIRUBICIN;  Surgeon: Franchot Gallo, MD;  Location: [facility];  Service: Urology;  Laterality: N/A;    There were no vitals filed for this visit.      Subjective Assessment - 09/17/17 0814    Subjective Mr. Stefanski states that he has been doing his exercise over the weekend and feels slightly better.     Pertinent History ACDF of C3-4, C4-5 on 04/03/17; arthritis, COPD, h/o bladder cancer, thoracic aortic aneurysm without rupture; motivated to participate with PT   Currently in Pain? Yes   Pain Score 3    Pain Location Neck   Pain Orientation Right;Left   Pain Descriptors / Indicators Aching   Pain Onset More than a month ago   Aggravating Factors  Turning his head.                           [facility] Adult PT Treatment/Exercise - 09/17/17 0001      Exercises   Exercises Neck     Neck Exercises: Standing   Other Standing Exercises Erect at wall with UE flexion keeping head in neutral      Neck Exercises: Seated   Neck Retraction 10 reps;5 secs   W Back 10 reps   Shoulder Shrugs 5 reps   Shoulder Shrugs Limitations up, back and relax    Other Seated Exercise cervical/thoracic excursion x 5      Neck Exercises: Supine   Cervical Isometrics Extension;Right lateral flexion;Left lateral flexion;5 reps   Neck Retraction 10 reps   Shoulder Flexion 10 reps   Other Supine Exercise scapular retraction x 10      Manual Therapy   Manual Therapy Soft tissue mobilization   Manual therapy comments separate rest of session    Soft tissue mobilization Supine position; STM to upper traps and periscapular musculature                  PT Short Term Goals - 09/17/17 0901      PT SHORT TERM GOAL #1   Title Pt will be independent with HEP and perform consistently in order to promote return to PLOF.   Time 3   Period Weeks   Status On-going     PT SHORT TERM GOAL #2   Title Pt will have improved cervical AROM  by 10 deg in all directions in order to demo improved function and maximize his ability to drive.   Time 3   Period Weeks   Status On-going     PT SHORT TERM GOAL #3   Title Pt will have at least a 1/2 grade improvement in MMT of all muscle groups tested to maximize pt's ability to shoot his gun competitively   Time 3   Period Weeks   Status On-going           PT Long Term Goals - 09/17/17  0901      PT LONG TERM GOAL #1   Title Pt will have improved cervical AROM to [facility] in all directions to maximize pt's ability to drive with greater ease.    Time 6   Period Weeks   Status On-going     PT LONG TERM GOAL #2   Title Pt will have improved MMT by 1 grade in all muscle groups tested in order to maximize his ability to shoot his gun competitively.   Time 6   Period Weeks   Status On-going     PT LONG TERM GOAL #3   Title Pt will have improved L grip strength to within 5# of the R to demo improved overall function and allow him to lift heavy objects with greater ease.   Time 6   Period Weeks   Status On-going     PT LONG TERM GOAL #4   Title Pt will report being able to shoot his gun(s) for at least 45 mins without having to take a break in order to demonstrate improved overall strength, endurance, and function.   Time 6   Period Weeks   Status On-going               Plan - 09/17/17 0900    Clinical Impression Statement Added w-back, cervical/thoracic excursion and standing stab exercises with pt needing constant verbal cuing for correct technique.     Rehab Potential Fair   PT Frequency 2x / week   PT Duration 6 weeks   PT Treatment/Interventions ADLs/Self Care Home Management;Cryotherapy;Electrical Stimulation;Moist Heat;Traction;Functional mobility training;Therapeutic activities;Therapeutic exercise;Balance training;Neuromuscular re-education;Patient/family education;Manual techniques;Passive range of motion;Dry needling;Energy conservation;Taping   PT Next Visit  Plan Update HEP with excursion , w back and scapular retraction    PT Home Exercise Plan eval: supine cervical retractions, cervical rotation with towel       Patient will benefit from skilled therapeutic intervention in order to improve the following deficits and impairments:  Decreased activity tolerance, Decreased range of motion, Decreased strength, Hypomobility, Increased muscle spasms, Increased fascial restricitons, Impaired sensation, Impaired UE functional use, Improper body mechanics, Postural dysfunction  Visit Diagnosis: Cervicalgia  Muscle weakness (generalized)  Abnormal posture  Other symptoms and signs involving the musculoskeletal system     Problem List Patient Active Problem List   Diagnosis Date Noted  . Cervical myelopathy ([place]) 04/03/2017  . Thoracic ascending aortic aneurysm ([place]) 05/09/2016  . 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 Paroxysmal atrial fibrillation ([place]) 12/17/2015  . S/P partial thyroidectomy 01/20/2015  Rayetta Humphrey, PT CLT [phone] 09/17/2017, 9:02 AM  [facility] [address] [place], [place], [address] Phone: [phone]   Fax:  [phone]  Name: Kyle Owen MRN: [id number] Date of Birth: August 05, 1941

## 2017-09-19 ENCOUNTER — Encounter (HOSPITAL_COMMUNITY): Payer: Self-pay

## 2017-09-19 ENCOUNTER — Ambulatory Visit (HOSPITAL_COMMUNITY): Payer: Medicare Other

## 2017-09-19 DIAGNOSIS — M542 Cervicalgia: Secondary | ICD-10-CM | POA: Diagnosis not present

## 2017-09-19 DIAGNOSIS — R293 Abnormal posture: Secondary | ICD-10-CM

## 2017-09-19 DIAGNOSIS — M6281 Muscle weakness (generalized): Secondary | ICD-10-CM | POA: Diagnosis not present

## 2017-09-19 DIAGNOSIS — R29898 Other symptoms and signs involving the musculoskeletal system: Secondary | ICD-10-CM

## 2017-09-19 NOTE — Therapy (Signed)
[name], [place], [address] Phone: [phone]   Fax:  [phone]  Physical Therapy Treatment  Patient Details  Name: Kyle Owen MRN: [id number] Date of Birth: 1941/05/18 Referring Provider: Peggyann Shoals, MD  Encounter Date: 09/19/2017      PT End of Session - 09/19/17 0819    Visit Number 4   Number of Visits 13   Date for PT Re-Evaluation 09/26/17   Authorization Type Medicare Part A and B (Secondary: [place])   Authorization Time Period 09/05/17 to 10/17/17   Authorization - Visit Number 4   Authorization - Number of Visits 10   PT Start Time 3009   PT Stop Time 0900   PT Time Calculation (min) 43 min   Activity Tolerance Patient tolerated treatment well;No increased pain   Behavior During Therapy WFL for tasks assessed/performed      Past Medical History:  Diagnosis Date  . Arthritis    DJD right knee  . Benign prostatic hypertrophy with urinary frequency   . Bladder cancer ([place])   . COPD with emphysema ([place])   . Depression   . Dyspnea    when walking  . Dysrhythmia    a fib  . History of colon polyps   . History of pneumonia    hx Recurrent -- last bout Dec 2016 CAP-- per pt resolved  . History of TIA (transient ischemic attack) no residual per pt   per neurologist note (Kyle Kyle Owen 11-08-2015) dx cryptogenic TIA versus arrhythia versus dysautonomia (right ophthalmic artery TIA and x3 posterior circulation TIAs  . Hyperlipidemia   . 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 Nephrolithiasis    right non-obstructive  per CT 05-02-2016  . Ocular migraine    controlled w/ verapamil  . PAF (paroxysmal atrial fibrillation) ([place])    followed by AFIB clinic-- cardiologist-- Kyle Kyle Owen  . Pneumonia    history of pnu.  . Pulmonary nodule, left    left lower lobe x2 per CT 01-20-2016  .  Renal cyst, left   . Thoracic aortic aneurysm without rupture ([place])    ascending -- ct chest 01/10/16  4.8cm  . Wears hearing aid    bilateral-- wear at times    Past Surgical History:  Procedure Laterality Date  . ANTERIOR CERVICAL DECOMP/DISCECTOMY FUSION N/A 04/03/2017   Procedure: Cervical three-four, Cervical four-five Anterior cervical decompression/discectomy/fusion;  Surgeon: Erline Levine, MD;  Location: [facility];  Service: Neurosurgery;  Laterality: N/A;  . CATARACT EXTRACTION W/ INTRAOCULAR LENS IMPLANT Right 2016  . CATARACT EXTRACTION W/PHACO  12/16/2012   Procedure: CATARACT EXTRACTION PHACO AND INTRAOCULAR LENS PLACEMENT (IOC);  Surgeon: Kyle Branch, MD;  [facility];  Service: Ophthalmology;  Laterality: Left;  CDE:17.31  . COLONOSCOPY  10/03/2011   Procedure: COLONOSCOPY;  Surgeon: Jamesetta So;  [facility];  Service: Gastroenterology;  Laterality: N/A;  . DECOMPRESSIOIN ULNAR NERVE AND CUBITAL TUNNEL RELEASE Left 07-14-2009   elbow  . 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 INGUINAL HERNIA REPAIR Right 1990's  . KNEE ARTHROSCOPY Right 2015  . LEFT CUBITAL TUNNEL RELEASE    . POSTERIOR LUMBAR FUSION  2014  . SHOULDER ARTHROSCOPY Left 1990's  . TEE WITHOUT CARDIOVERSION N/A 07/27/2015   Procedure: TRANSESOPHAGEAL ECHOCARDIOGRAM (TEE);  Surgeon: Kyle Perla,  MD;  [facility];  Service: Cardiovascular;  Laterality: N/A;   normal LV function, ef 55-60%,  mild AR and MR, mild dilated ascending aorta (4.2cm),  mild to moderate atherosclerosis  descending aorta,  mild to moderate TR,  negative saline microcavitation study  . THYROIDECTOMY Right 01/20/2015   Procedure: RIGHT THYROIDECTOMY;  Surgeon: Kyle Dike, MD;  Location: [facility];  Service: ENT;  Laterality: Right;  . TONSILLECTOMY  as child  . TRANSURETHRAL RESECTION OF BLADDER TUMOR N/A 05/15/2016   Procedure: TRANSURETHRAL RESECTION OF BLADDER  TUMOR (TURBT) AND INSTILLATION OF EPIRUBICIN;  Surgeon: Kyle Gallo, MD;  Location: [facility];  Service: Urology;  Laterality: N/A;    There were no vitals filed for this visit.      Subjective Assessment - 09/19/17 0819    Subjective Pt states that his neck is just sore this morning. He did a lot yesterday. No pain. He feels his numbness in his fingertips is a little better.   Pertinent History ACDF of C3-4, C4-5 on 04/03/17; arthritis, COPD, h/o bladder cancer, thoracic aortic aneurysm without rupture; motivated to participate with PT   Currently in Pain? No/denies   Pain Onset More than a month ago                         [facility] Adult PT Treatment/Exercise - 09/19/17 0001      Neck Exercises: Standing   Wall Push Ups 10 reps   Wall Push Ups Limitations with plus   Other Standing Exercises shoulder taps in modified plantigrade maintaining serratus contraction x10 reps     Neck Exercises: Seated   Neck Retraction 10 reps;5 secs   Other Seated Exercise 3D thoracic excursions x10 reps each     Neck Exercises: Supine   Other Supine Exercise serratus punches with 2# DB x15 reps     Neck Exercises: Prone   W Back 10 reps   Shoulder Extension 10 reps   Shoulder Extension Weights (lbs) 2   Rows Limitations x10 reps with 2# DB BUE   Other Prone Exercise [place] 6's #1 and 2 x10 reps each (2# DB with #1)     Manual Therapy   Manual Therapy Joint mobilization;Soft tissue mobilization   Manual therapy comments separate rest of session    Joint Mobilization Grade III PAs to C2-T2   Soft tissue mobilization prone position, STM levator scap                PT Education - 09/19/17 2993    Education provided Yes   Education Details exercise technique, added 3D thoracic excursions and supine serratus punches to HEP   Person(s) Educated Patient   Methods Explanation;Demonstration;Handout   Comprehension Verbalized understanding;Returned  demonstration          PT Short Term Goals - 09/17/17 0901      PT SHORT TERM GOAL #1   Title Pt will be independent with HEP and perform consistently in order to promote return to PLOF.   Time 3   Period Weeks   Status On-going     PT SHORT TERM GOAL #2   Title Pt will have improved cervical AROM by 10 deg in all directions in order to demo improved function and maximize his ability to drive.   Time 3   Period Weeks   Status On-going     PT SHORT TERM GOAL #3   Title Pt will have at least  a 1/2 grade improvement in MMT of all muscle groups tested to maximize pt's ability to shoot his gun competitively   Time 3   Period Weeks   Status On-going           PT Long Term Goals - 09/17/17 0901      PT LONG TERM GOAL #1   Title Pt will have improved cervical AROM to [facility] in all directions to maximize pt's ability to drive with greater ease.    Time 6   Period Weeks   Status On-going     PT LONG TERM GOAL #2   Title Pt will have improved MMT by 1 grade in all muscle groups tested in order to maximize his ability to shoot his gun competitively.   Time 6   Period Weeks   Status On-going     PT LONG TERM GOAL #3   Title Pt will have improved L grip strength to within 5# of the R to demo improved overall function and allow him to lift heavy objects with greater ease.   Time 6   Period Weeks   Status On-going     PT LONG TERM GOAL #4   Title Pt will report being able to shoot his gun(s) for at least 45 mins without having to take a break in order to demonstrate improved overall strength, endurance, and function.   Time 6   Period Weeks   Status On-going               Plan - 09/19/17 0900    Clinical Impression Statement Pt presented to therapy without reports of pain, just soreness. Session focused on improving posture, scap stabilization. Pt noted to have significant scapular winging during scap stab work indicating weak serratus anterior. Will continue to  address this in future sessions. No pain at EOS.   Rehab Potential Fair   PT Frequency 2x / week   PT Duration 6 weeks   PT Treatment/Interventions ADLs/Self Care Home Management;Cryotherapy;Electrical Stimulation;Moist Heat;Traction;Functional mobility training;Therapeutic activities;Therapeutic exercise;Balance training;Neuromuscular re-education;Patient/family education;Manual techniques;Passive range of motion;Dry needling;Energy conservation;Taping   PT Next Visit Plan serratus strengthening, postural strengthening, cervical spinal mobs if with PT, scap stabilization and general shoulder strengthening   PT Home Exercise Plan eval: supine cervical retractions, cervical rotation with towel; 9/26: 3D thoracic excursions, supine serratus punches with DB   Consulted and Agree with Plan of Care Patient      Patient will benefit from skilled therapeutic intervention in order to improve the following deficits and impairments:  Decreased activity tolerance, Decreased range of motion, Decreased strength, Hypomobility, Increased muscle spasms, Increased fascial restricitons, Impaired sensation, Impaired UE functional use, Improper body mechanics, Postural dysfunction  Visit Diagnosis: Cervicalgia  Muscle weakness (generalized)  Abnormal posture  Other symptoms and signs involving the musculoskeletal system     Problem List Patient Active Problem List   Diagnosis Date Noted  . Cervical myelopathy ([place]) 04/03/2017  . Thoracic ascending aortic aneurysm ([place]) 05/09/2016  . 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 Paroxysmal atrial fibrillation ([place]) 12/17/2015  . S/P partial thyroidectomy 01/20/2015       Geraldine Solar PT, DPT  [facility] [address] [place], [place], [address] Phone: [phone]   Fax:  [phone]  Name: ATHOL BOLDS MRN: [id number] Date of Birth: 09/16/1941

## 2017-09-19 NOTE — Patient Instructions (Signed)
  SCAPULAR PROTRACTION - FREE WEIGHT - SERRATUS PUNCHES  Lie on your back holding a small free weight or soup can with your arm extended out in front of your body and towards the ceiling. While keeping your elbow straight, protract your shoulders forward towards the ceiling and then lower back down in a control motion.   Do not allow your shoulder to raise towards your ears.   Keep your elbow straight the entire time.  Perform 1x/day, 2-3 sets of 5-10 reps with your 3 or 5# weight

## 2017-09-21 ENCOUNTER — Telehealth (HOSPITAL_COMMUNITY): Payer: Self-pay | Admitting: Internal Medicine

## 2017-09-21 NOTE — Telephone Encounter (Signed)
pt cx said he had a conflict.   I asked if he wanted to schedule for another day next week and he said he had Thursday so just leave it at that   09/21/17

## 2017-09-25 ENCOUNTER — Ambulatory Visit
Admission: RE | Admit: 2017-09-25 | Discharge: 2017-09-25 | Disposition: A | Discharge status: Home / Self Care | Payer: Medicare Other | Source: Ambulatory Visit | Attending: Nurse Practitioner | Admitting: Nurse Practitioner

## 2017-09-25 ENCOUNTER — Encounter (HOSPITAL_COMMUNITY): Payer: Medicare Other

## 2017-09-25 DIAGNOSIS — M545 Low back pain: Secondary | ICD-10-CM | POA: Diagnosis not present

## 2017-09-25 DIAGNOSIS — M47816 Spondylosis without myelopathy or radiculopathy, lumbar region: Secondary | ICD-10-CM

## 2017-09-25 MED ORDER — IOPAMIDOL (ISOVUE-M 200) INJECTION 41%
1.0000 mL | Freq: Once | INTRAMUSCULAR | Status: AC
Start: 2017-09-25 — End: 2017-09-25
  Administered 2017-09-25: 1 mL via INTRA_ARTICULAR

## 2017-09-25 MED ORDER — METHYLPREDNISOLONE ACETATE 40 MG/ML INJ SUSP (RADIOLOG
120.0000 mg | Freq: Once | INTRAMUSCULAR | Status: AC
  Administered 2017-09-25: 120 mg via INTRA_ARTICULAR

## 2017-09-25 NOTE — Discharge Instructions (Signed)
Spinal Injection Discharge Instruction Sheet ° °1. You may resume a regular diet and any medications that you routinely take, including pain medications. ° °2. No driving the rest of the day of the procedure. ° °3. Light activity throughout the rest of the day.  Do not do any strenuous work, exercise, bending or lifting.  The day following the procedure, you may resume normal physical activity but you should refrain from exercising or physical therapy for at least three days. ° ° °Common Side Effects: ° °· Headaches- take your usual medications as directed by your physician.   ° °· Restlessness or inability to sleep- you may have trouble sleeping for the next few days.  Ask your referring physician if you need any medication for sleep if over the counter sleep medications do not help. ° °· Facial flushing or redness- this should subside within a few days. ° °· Increased pain- a temporary increase in pain a day or two following your procedure is not unusual.  Take your pain medication as prescribed by your referring physician.  You may use ice to the injection site as needed.  Please do not use heat for 24 hours. ° °· Leg cramps ° °Please contact our office at [phone] for the following symptoms: °· Fever greater than 100 degrees. °· Headaches unresolved with medication after 2-3 days. °· Increased swelling, pain, or redness at injection site. ° °Thank you for visiting our office. ° ° °You may resume Eliquis today. °

## 2017-09-26 ENCOUNTER — Telehealth (HOSPITAL_COMMUNITY): Payer: Self-pay

## 2017-09-26 NOTE — Telephone Encounter (Signed)
PT GOT A SHOT IN THE SPINE AND MD DOES NOT WANT HIM TO EXERCISE TODAY

## 2017-09-27 ENCOUNTER — Ambulatory Visit (HOSPITAL_COMMUNITY): Payer: Medicare Other

## 2017-09-28 ENCOUNTER — Ambulatory Visit (INDEPENDENT_AMBULATORY_CARE_PROVIDER_SITE_OTHER): Payer: Medicare Other | Admitting: *Deleted

## 2017-09-28 DIAGNOSIS — I639 Cerebral infarction, unspecified: Secondary | ICD-10-CM | POA: Diagnosis not present

## 2017-10-01 LAB — CUP PACEART REMOTE DEVICE CHECK
Date Time Interrogation Session: 20181005221129
Implantable Pulse Generator Implant Date: 20160816

## 2017-10-01 NOTE — Progress Notes (Signed)
Carelink Summary Report / Loop Recorder 

## 2017-10-02 ENCOUNTER — Encounter (HOSPITAL_COMMUNITY): Payer: Self-pay

## 2017-10-02 ENCOUNTER — Ambulatory Visit (HOSPITAL_COMMUNITY): Payer: Medicare Other | Attending: Neurosurgery

## 2017-10-02 DIAGNOSIS — R293 Abnormal posture: Secondary | ICD-10-CM

## 2017-10-02 DIAGNOSIS — M542 Cervicalgia: Secondary | ICD-10-CM

## 2017-10-02 DIAGNOSIS — R29898 Other symptoms and signs involving the musculoskeletal system: Secondary | ICD-10-CM

## 2017-10-02 DIAGNOSIS — M6281 Muscle weakness (generalized): Secondary | ICD-10-CM

## 2017-10-02 NOTE — Therapy (Signed)
[place], [place], [address] Phone: [phone]   Fax:  [phone]  Physical Therapy Treatment/Reassessment  Patient Details  Name: Kyle Owen MRN: [id number] Date of Birth: [birth year] Referring Provider: Peggyann Shoals, MD  Encounter Date: 10/02/2017      PT End of Session - 10/02/17 0812    Visit Number 5   Number of Visits 13   Date for PT Re-Evaluation Oct 27, 2017   Authorization Type Medicare Part A and B (Secondary: [place])    Authorization Time Period 09/15/17 to 2017-10-27 (g-codes done on February 08, 2023 visit)   Authorization - Visit Number 5   Authorization - Number of Visits 10   PT Start Time 0815   PT Stop Time 6301   PT Time Calculation (min) 42 min   Activity Tolerance Patient tolerated treatment well;No increased pain   Behavior During Therapy WFL for tasks assessed/performed      Past Medical History:  Diagnosis Date  . Arthritis    DJD right knee  . Benign prostatic hypertrophy with urinary frequency   . Bladder cancer ([place])   . COPD with emphysema ([place])   . Depression   . Dyspnea    when walking  . Dysrhythmia    a fib  . History of colon polyps   . History of pneumonia    hx Recurrent -- last bout Dec 2016 CAP-- per pt resolved  . 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 Hyperlipidemia   . 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 Nephrolithiasis    right non-obstructive  per CT 05-02-2016  . Ocular migraine    controlled w/ verapamil  . PAF (paroxysmal atrial fibrillation) ([place])    followed by AFIB clinic-- cardiologist-- dr Marlou Porch  . Pneumonia    history of pnu.  . Pulmonary nodule, left     left lower lobe x2 per CT 01-20-2016  . Renal cyst, left   . Thoracic aortic aneurysm without rupture ([place])    ascending -- ct chest 01/10/16  4.8cm  . Wears hearing aid    bilateral-- wear at times    Past Surgical History:  Procedure Laterality Date  . ANTERIOR CERVICAL DECOMP/DISCECTOMY FUSION N/A 04/03/2017   Procedure: Cervical three-four, Cervical four-five Anterior cervical decompression/discectomy/fusion;  Surgeon: Erline Levine, MD;  Location: [facility];  Service: Neurosurgery;  Laterality: N/A;  . CATARACT EXTRACTION W/ INTRAOCULAR LENS IMPLANT Right 2016  . CATARACT EXTRACTION W/PHACO  12/16/2012   Procedure: CATARACT EXTRACTION PHACO AND INTRAOCULAR LENS PLACEMENT (IOC);  Surgeon: Tonny Branch, MD;  [facility];  Service: Ophthalmology;  Laterality: Left;  CDE:17.31  . COLONOSCOPY  10/03/2011   Procedure: COLONOSCOPY;  Surgeon: Jamesetta So;  [facility];  Service: Gastroenterology;  Laterality: N/A;  . DECOMPRESSIOIN ULNAR NERVE AND CUBITAL TUNNEL RELEASE Left 07-14-2009   elbow  . 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 INGUINAL HERNIA REPAIR Right 1990's  . KNEE ARTHROSCOPY Right 2015  . LEFT CUBITAL TUNNEL RELEASE    . POSTERIOR LUMBAR FUSION  2014  . SHOULDER ARTHROSCOPY Left 1990's  . TEE WITHOUT CARDIOVERSION N/A 07/27/2015   Procedure: TRANSESOPHAGEAL ECHOCARDIOGRAM (  TEE);  Surgeon: Lelon Perla, MD;  Location: [facility];  Service: Cardiovascular;  Laterality: N/A;   normal LV function, ef 55-60%,  mild AR and MR, mild dilated ascending aorta (4.2cm),  mild to moderate atherosclerosis  descending aorta,  mild to moderate TR,  negative saline microcavitation study  . THYROIDECTOMY Right 01/20/2015   Procedure: RIGHT THYROIDECTOMY;  Surgeon: Ascencion Dike, MD;  Location: [facility];  Service: ENT;  Laterality: Right;  . TONSILLECTOMY  as child  . TRANSURETHRAL RESECTION OF BLADDER TUMOR N/A 05/15/2016    Procedure: TRANSURETHRAL RESECTION OF BLADDER TUMOR (TURBT) AND INSTILLATION OF EPIRUBICIN;  Surgeon: Franchot Gallo, MD;  Location: [facility];  Service: Urology;  Laterality: N/A;    There were no vitals filed for this visit.      Subjective Assessment - 10/02/17 0813    Subjective Pt reports that he received a steroid shot last week which is why he mised his appointment last week. He reports that his neck is feeling pretty good. It's not hurting much anymore. He reports that the tingling in his fingertips is almost completely gone.    Pertinent History ACDF of C3-4, C4-5 on 04/03/17; arthritis, COPD, h/o bladder cancer, thoracic aortic aneurysm without rupture; motivated to participate with PT   Currently in Pain? No/denies   Pain Onset More than a month ago            [facility] PT Assessment - 10/02/17 0001      AROM   Cervical Flexion 35  was 18   Cervical Extension 23  was 18   Cervical - Right Side Bend 26  was 18   Cervical - Left Side Bend 25  was 20   Cervical - Right Rotation 71  was 66   Cervical - Left Rotation 54  was 41     Strength   Right Shoulder Flexion 4+/5  was 4   Right Shoulder ABduction 4+/5  was 4+   Left Shoulder Flexion 4+/5  was 4   Left Shoulder ABduction 4/5  was 4   Left Shoulder External Rotation 5/5  was 4+   Right Hand Gross Grasp Functional   Right Hand Grip (lbs) 89  was 89   Left Hand Gross Grasp Functional   Left Hand Grip (lbs) 86            [facility] Adult PT Treatment/Exercise - 10/02/17 0001      Neck Exercises: Standing   Other Standing Exercises flexion, scaption, and abd with RTB x 10 reps each     Neck Exercises: Seated   Upper Extremity D2 Flexion;20 reps   Theraband Level (UE D2) Level 2 (Red)                PT Education - 10/02/17 0856    Education provided Yes   Education Details reassessment findings, POC, HEP   Person(s) Educated Patient   Methods  Explanation;Demonstration;Handout   Comprehension Verbalized understanding;Returned demonstration          PT Short Term Goals - 10/02/17 0822      PT SHORT TERM GOAL #1   Title Pt will be independent with HEP and perform consistently in order to promote return to PLOF.   Time 3   Period Weeks   Status Achieved     PT SHORT TERM GOAL #2   Title Pt will have improved cervical AROM by 10 deg in all directions in order to demo improved function and  maximize his ability to drive.   Time 3   Period Weeks   Status Partially Met     PT SHORT TERM GOAL #3   Title Pt will have at least a 1/2 grade improvement in MMT of all muscle groups tested to maximize pt's ability to shoot his gun competitively   Time 3   Period Weeks   Status Partially Met           PT Long Term Goals - 10/02/17 3295      PT LONG TERM GOAL #1   Title Pt will have improved cervical AROM to [facility] in all directions to maximize pt's ability to drive with greater ease.    Time 6   Period Weeks   Status Partially Met     PT LONG TERM GOAL #2   Title Pt will have improved MMT by 1 grade in all muscle groups tested in order to maximize his ability to shoot his gun competitively.   Baseline 10/9:   Time 6   Period Weeks   Status On-going     PT LONG TERM GOAL #3   Title Pt will have improved L grip strength to within 5# of the R to demo improved overall function and allow him to lift heavy objects with greater ease.   Time 6   Period Weeks   Status Achieved     PT LONG TERM GOAL #4   Title Pt will report being able to shoot his gun(s) for at least 45 mins without having to take a break in order to demonstrate improved overall strength, endurance, and function.   Baseline 10/9: hasn't tried, will try this week but feels like he could for at least 25 mins   Time 6   Period Weeks   Status On-going               Plan - 10/02/17 0857    Clinical Impression Statement PT reassessed pt's goals and  outcome measures this date. Pt has made great progress towards his goals as illustrated above. His ROM has improved and he reports that his driving has significantly improved to the point that he is very comfortable behind the wheel and he can turn his head enough to see all of the traffic. He feels at least 60-75% improved, driving is now comfortable and his range improved; he reports that his remaining limitation is his L arm strength. Pt's cert ends on 18/84 so the remainder of his POC will focus on shoulder strength, stabilization, and endurance to help pt return to being able to shoot his guns.    Rehab Potential Fair   PT Frequency 2x / week   PT Duration 6 weeks   PT Treatment/Interventions ADLs/Self Care Home Management;Cryotherapy;Electrical Stimulation;Moist Heat;Traction;Functional mobility training;Therapeutic activities;Therapeutic exercise;Balance training;Neuromuscular re-education;Patient/family education;Manual techniques;Passive range of motion;Dry needling;Energy conservation;Taping   PT Next Visit Plan serratus strengthening, postural strengthening, scap stabilization and general shoulder strengthening   PT Home Exercise Plan eval: supine cervical retractions, cervical rotation with towel; 9/26: 3D thoracic excursions, supine serratus punches with DB; 10/9: D2 PNF with RTB, shoulder flex, scaption, abd with RTB standing   Consulted and Agree with Plan of Care Patient      Patient will benefit from skilled therapeutic intervention in order to improve the following deficits and impairments:  Decreased activity tolerance, Decreased range of motion, Decreased strength, Hypomobility, Increased muscle spasms, Increased fascial restricitons, Impaired sensation, Impaired UE functional use, Improper body mechanics, Postural dysfunction  Visit Diagnosis: Cervicalgia  Muscle weakness (generalized)  Abnormal posture  Other symptoms and signs involving the musculoskeletal system        G-Codes - 2017/10/27 0812    Functional Assessment Tool Used (Outpatient Only) FOTO, clinical judgement, MMT, AROM, posture   Functional Limitation Carrying, moving and handling objects   Carrying, Moving and Handling Objects Current Status (P1025) At least 20 percent but less than 40 percent impaired, limited or restricted   Carrying, Moving and Handling Objects Goal Status (E5277) At least 1 percent but less than 20 percent impaired, limited or restricted      Problem List Patient Active Problem List   Diagnosis Date Noted  . Cervical myelopathy ([place]) 04/03/2017  . Thoracic ascending aortic aneurysm ([place]) 05/09/2016  . Thoracic aortic aneurysm without rupture ([place]) 01/17/2016  . Paroxysmal atrial fibrillation ([place]) 12/17/2015  . S/P partial thyroidectomy 01/20/2015        Geraldine Solar PT, DPT  [facility] [address] [place], [place], [address] Phone: [phone]   Fax:  [phone]  Name: Kyle Owen MRN: [id number] Date of Birth: 10/02/1941

## 2017-10-02 NOTE — Patient Instructions (Signed)
  PNF 2 Flexion Pattern -- perform in sitting  Start with arm internally rotated with your palm facing behind you on the opposite side of the arm you are exercising.    Slowly and controlled raise your arm to the opposite side with your elbow slightly bent approximately 5-10 degrees and rotate your arm/shoulder so that your palm is facing in front of you.  Make sure to squeeze your shoulder blades together and start rotating your shoulder at chest level.  Perform 1x/day, 2-3 sets of 10-15 reps with RTB    Shoulder Flexion - Theraband  Place one end of the theraband under your foot and one in your hand.  Keeping elbow straight, raise arm straight out in front  Perform 1x/day, 2-3 sets of 5-10 reps with RTB

## 2017-10-04 ENCOUNTER — Encounter (HOSPITAL_COMMUNITY): Payer: Self-pay

## 2017-10-04 ENCOUNTER — Ambulatory Visit (HOSPITAL_COMMUNITY): Payer: Medicare Other

## 2017-10-04 DIAGNOSIS — R29898 Other symptoms and signs involving the musculoskeletal system: Secondary | ICD-10-CM | POA: Diagnosis not present

## 2017-10-04 DIAGNOSIS — M6281 Muscle weakness (generalized): Secondary | ICD-10-CM

## 2017-10-04 DIAGNOSIS — M542 Cervicalgia: Secondary | ICD-10-CM | POA: Diagnosis not present

## 2017-10-04 DIAGNOSIS — R293 Abnormal posture: Secondary | ICD-10-CM | POA: Diagnosis not present

## 2017-10-04 NOTE — Therapy (Signed)
[facility] [place], [place], [address] Phone: [phone]   Fax:  [phone]  Physical Therapy Treatment  Patient Details  Name: Kyle Owen MRN: [id number] Date of Birth: 01-19-1941 Referring Provider: Peggyann Shoals, MD  Encounter Date: 10/04/2017      PT End of Session - 10/04/17 5188    Visit Number 6   Number of Visits 13   Date for PT Re-Evaluation 2017/10/18   Authorization Type Medicare Part A and B (Secondary: [place])    Authorization Time Period 2017/09/06 to 10/18/2017 (g-codes done on 30-Jan-2023 visit)   Authorization - Visit Number 6   Authorization - Number of Visits 10   PT Start Time 0817   PT Stop Time 0857   PT Time Calculation (min) 40 min   Activity Tolerance Patient tolerated treatment well;No increased pain   Behavior During Therapy WFL for tasks assessed/performed      Past Medical History:  Diagnosis Date  . Arthritis    DJD right knee  . Benign prostatic hypertrophy with urinary frequency   . Bladder cancer ([place])   . COPD with emphysema ([place])   . Depression   . Dyspnea    when walking  . Dysrhythmia    a fib  . History of colon polyps   . History of pneumonia    hx Recurrent -- last bout Dec 2016 CAP-- per pt resolved  . 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 Hyperlipidemia   . 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 Nephrolithiasis    right non-obstructive  per CT 05-02-2016  . Ocular migraine    controlled w/ verapamil  . PAF (paroxysmal atrial fibrillation) ([place])    followed by AFIB clinic-- cardiologist-- dr Marlou Porch  . Pneumonia    history of pnu.  . Pulmonary nodule, left    left lower  lobe x2 per CT 01-20-2016  . Renal cyst, left   . Thoracic aortic aneurysm without rupture ([place])    ascending -- ct chest 01/10/16  4.8cm  . Wears hearing aid    bilateral-- wear at times    Past Surgical History:  Procedure Laterality Date  . ANTERIOR CERVICAL DECOMP/DISCECTOMY FUSION N/A 04/03/2017   Procedure: Cervical three-four, Cervical four-five Anterior cervical decompression/discectomy/fusion;  Surgeon: Erline Levine, MD;  Location: [facility];  Service: Neurosurgery;  Laterality: N/A;  . CATARACT EXTRACTION W/ INTRAOCULAR LENS IMPLANT Right 2016  . CATARACT EXTRACTION W/PHACO  12/16/2012   Procedure: CATARACT EXTRACTION PHACO AND INTRAOCULAR LENS PLACEMENT (IOC);  Surgeon: Tonny Branch, MD;  [facility];  Service: Ophthalmology;  Laterality: Left;  CDE:17.31  . COLONOSCOPY  10/03/2011   Procedure: COLONOSCOPY;  Surgeon: Jamesetta So;  [facility];  Service: Gastroenterology;  Laterality: N/A;  . DECOMPRESSIOIN ULNAR NERVE AND CUBITAL TUNNEL RELEASE Left 07-14-2009   elbow  . 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 INGUINAL HERNIA REPAIR Right 1990's  . KNEE ARTHROSCOPY Right 2015  . LEFT CUBITAL TUNNEL RELEASE    . POSTERIOR LUMBAR FUSION  2014  . SHOULDER ARTHROSCOPY Left 1990's  . TEE WITHOUT CARDIOVERSION N/A 07/27/2015   Procedure: TRANSESOPHAGEAL ECHOCARDIOGRAM (  TEE);  Surgeon: Lelon Perla, MD;  Location: [facility];  Service: Cardiovascular;  Laterality: N/A;   normal LV function, ef 55-60%,  mild AR and MR, mild dilated ascending aorta (4.2cm),  mild to moderate atherosclerosis  descending aorta,  mild to moderate TR,  negative saline microcavitation study  . THYROIDECTOMY Right 01/20/2015   Procedure: RIGHT THYROIDECTOMY;  Surgeon: Ascencion Dike, MD;  Location: [facility];  Service: ENT;  Laterality: Right;  . TONSILLECTOMY  as child  . TRANSURETHRAL RESECTION OF BLADDER TUMOR N/A 05/15/2016   Procedure:  TRANSURETHRAL RESECTION OF BLADDER TUMOR (TURBT) AND INSTILLATION OF EPIRUBICIN;  Surgeon: Franchot Gallo, MD;  Location: [facility];  Service: Urology;  Laterality: N/A;    There were no vitals filed for this visit.      Subjective Assessment - 10/04/17 0821    Subjective Pt states that his shoulders are sore this morning from his new HEP.   Pertinent History ACDF of C3-4, C4-5 on 04/03/17; arthritis, COPD, h/o bladder cancer, thoracic aortic aneurysm without rupture; motivated to participate with PT   Currently in Pain? Yes   Pain Score 4    Pain Location Shoulder   Pain Orientation Left;Right   Pain Descriptors / Indicators Sore   Pain Type Chronic pain   Pain Onset More than a month ago   Pain Frequency Constant   Aggravating Factors  turning his head   Pain Relieving Factors lay flat   Effect of Pain on Daily Activities increases               [facility] Adult PT Treatment/Exercise - 10/04/17 0001      Neck Exercises: Machines for Strengthening   UBE (Upper Arm Bike) x4 mins L1 (x2 mins fwd/retro each)     Neck Exercises: Standing   Wall Push Ups 10 reps   Wall Push Ups Limitations with plus   UE Flexion with Stabilization Limitations shoulder taps in modified plantigrade    Wall Wash 2x5 with 2#   Other Standing Exercises flexion, scaption, and abd with RTB x 10 reps each; bear hugs with GTB x15 reps   Other Standing Exercises scap retraction and shoulder extension with GTB x15 reps each; [place] IR/ER with towel roll with RTB x15 reps each     Neck Exercises: Seated   Money Limitations body blade horiz and vert perturbations with elbow bent in sitting, 5x10" each     Manual Therapy   Manual Therapy Myofascial release   Manual therapy comments separate rest of session    Myofascial Release in prone, MFR to cervical paraspinals and levator scap on L               PT Short Term Goals - 10/02/17 1601      PT SHORT TERM GOAL #1   Title Pt will  be independent with HEP and perform consistently in order to promote return to PLOF.   Time 3   Period Weeks   Status Achieved     PT SHORT TERM GOAL #2   Title Pt will have improved cervical AROM by 10 deg in all directions in order to demo improved function and maximize his ability to drive.   Time 3   Period Weeks   Status Partially Met     PT SHORT TERM GOAL #3   Title Pt will have at least a 1/2 grade improvement in MMT of all muscle groups tested to maximize pt's ability to shoot his  gun competitively   Time 3   Period Weeks   Status Partially Met           PT Long Term Goals - 10/02/17 6314      PT LONG TERM GOAL #1   Title Pt will have improved cervical AROM to [facility] in all directions to maximize pt's ability to drive with greater ease.    Time 6   Period Weeks   Status Partially Met     PT LONG TERM GOAL #2   Title Pt will have improved MMT by 1 grade in all muscle groups tested in order to maximize his ability to shoot his gun competitively.   Baseline 10/9:   Time 6   Period Weeks   Status On-going     PT LONG TERM GOAL #3   Title Pt will have improved L grip strength to within 5# of the R to demo improved overall function and allow him to lift heavy objects with greater ease.   Time 6   Period Weeks   Status Achieved     PT LONG TERM GOAL #4   Title Pt will report being able to shoot his gun(s) for at least 45 mins without having to take a break in order to demonstrate improved overall strength, endurance, and function.   Baseline 10/9: hasn't tried, will try this week but feels like he could for at least 25 mins   Time 6   Period Weeks   Status On-going               Plan - 10/04/17 0857    Clinical Impression Statement Session focused on general shoulder stability and strengthening. He reported some soreness and demo'd increased weakness of L shoulder. He reported that his soreness had subsided towards the end of session. He was noted to have  continued serratus weakness as well as palpable knots of L cervical paraspinals and levator scap which was addressed with MFR. Continue POC as planned.   Rehab Potential Fair   PT Frequency 2x / week   PT Duration 6 weeks   PT Treatment/Interventions ADLs/Self Care Home Management;Cryotherapy;Electrical Stimulation;Moist Heat;Traction;Functional mobility training;Therapeutic activities;Therapeutic exercise;Balance training;Neuromuscular re-education;Patient/family education;Manual techniques;Passive range of motion;Dry needling;Energy conservation;Taping   PT Next Visit Plan increased serratus strengthening, postural strengthening, scap stabilization and general shoulder strengthening   PT Home Exercise Plan eval: supine cervical retractions, cervical rotation with towel; 9/26: 3D thoracic excursions, supine serratus punches with DB; 10/9: D2 PNF with RTB, shoulder flex, scaption, abd with RTB standing   Consulted and Agree with Plan of Care Patient      Patient will benefit from skilled therapeutic intervention in order to improve the following deficits and impairments:  Decreased activity tolerance, Decreased range of motion, Decreased strength, Hypomobility, Increased muscle spasms, Increased fascial restricitons, Impaired sensation, Impaired UE functional use, Improper body mechanics, Postural dysfunction  Visit Diagnosis: Cervicalgia  Muscle weakness (generalized)  Abnormal posture  Other symptoms and signs involving the musculoskeletal system     Problem List Patient Active Problem List   Diagnosis Date Noted  . Cervical myelopathy ([place]) 04/03/2017  . Thoracic ascending aortic aneurysm ([place]) 05/09/2016  . 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 Paroxysmal atrial fibrillation ([place]) 12/17/2015  . S/P partial thyroidectomy 01/20/2015       Geraldine Solar PT, DPT  [facility] [address] [place], [place],  [address] Phone: [phone]   Fax:  [phone]  Name:  MITSUGI SCHRADER MRN: [id number] Date of Birth: 1941/04/24

## 2017-10-08 ENCOUNTER — Telehealth (HOSPITAL_COMMUNITY): Payer: Self-pay | Admitting: Physical Therapy

## 2017-10-08 ENCOUNTER — Ambulatory Visit (HOSPITAL_COMMUNITY): Payer: Medicare Other | Admitting: Physical Therapy

## 2017-10-08 NOTE — Telephone Encounter (Signed)
Pt called left message on phone re next appointment is on Thursday at 8:15.  Rayetta Humphrey, [place] CLT [phone]

## 2017-10-11 ENCOUNTER — Ambulatory Visit (HOSPITAL_COMMUNITY): Payer: Medicare Other

## 2017-10-11 ENCOUNTER — Encounter (HOSPITAL_COMMUNITY): Payer: Self-pay

## 2017-10-11 DIAGNOSIS — M6281 Muscle weakness (generalized): Secondary | ICD-10-CM | POA: Diagnosis not present

## 2017-10-11 DIAGNOSIS — R29898 Other symptoms and signs involving the musculoskeletal system: Secondary | ICD-10-CM

## 2017-10-11 DIAGNOSIS — M542 Cervicalgia: Secondary | ICD-10-CM

## 2017-10-11 DIAGNOSIS — R293 Abnormal posture: Secondary | ICD-10-CM | POA: Diagnosis not present

## 2017-10-11 NOTE — Therapy (Signed)
[facility] [place], [place], [address] Phone: [phone]   Fax:  [phone]  Physical Therapy Treatment  Patient Details  Name: Kyle Owen MRN: [id number] Date of Birth: 1941/01/06 Referring Provider: Peggyann Shoals, MD  Encounter Date: 10/11/2017      PT End of Session - 10/11/17 0813    Visit Number 7   Number of Visits 13   Date for PT Re-Evaluation 2017-10-19   Authorization Type Medicare Part A and B (Secondary: [place])    Authorization Time Period September 07, 2017 to 10/19/17 (g-codes done on 2022-12-31 visit)   Authorization - Visit Number 7   Authorization - Number of Visits 10   PT Start Time 0815   PT Stop Time 9628   PT Time Calculation (min) 40 min   Activity Tolerance Patient tolerated treatment well;No increased pain   Behavior During Therapy WFL for tasks assessed/performed      Past Medical History:  Diagnosis Date  . Arthritis    DJD right knee  . Benign prostatic hypertrophy with urinary frequency   . Bladder cancer ([place])   . COPD with emphysema ([place])   . Depression   . Dyspnea    when walking  . Dysrhythmia    a fib  . History of colon polyps   . History of pneumonia    hx Recurrent -- last bout Dec 2016 CAP-- per pt resolved  . 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 Hyperlipidemia   . 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 Nephrolithiasis    right non-obstructive  per CT 05-02-2016  . Ocular migraine    controlled w/ verapamil  . PAF (paroxysmal atrial fibrillation) ([place])    followed by AFIB clinic-- cardiologist-- dr Marlou Porch  . Pneumonia    history of pnu.  . Pulmonary nodule, left    left lower  lobe x2 per CT 01-20-2016  . Renal cyst, left   . Thoracic aortic aneurysm without rupture ([place])    ascending -- ct chest 01/10/16  4.8cm  . Wears hearing aid    bilateral-- wear at times    Past Surgical History:  Procedure Laterality Date  . ANTERIOR CERVICAL DECOMP/DISCECTOMY FUSION N/A 04/03/2017   Procedure: Cervical three-four, Cervical four-five Anterior cervical decompression/discectomy/fusion;  Surgeon: Erline Levine, MD;  Location: [facility];  Service: Neurosurgery;  Laterality: N/A;  . CATARACT EXTRACTION W/ INTRAOCULAR LENS IMPLANT Right 2016  . CATARACT EXTRACTION W/PHACO  12/16/2012   Procedure: CATARACT EXTRACTION PHACO AND INTRAOCULAR LENS PLACEMENT (IOC);  Surgeon: Tonny Branch, MD;  [facility];  Service: Ophthalmology;  Laterality: Left;  CDE:17.31  . COLONOSCOPY  10/03/2011   Procedure: COLONOSCOPY;  Surgeon: Jamesetta So;  [facility];  Service: Gastroenterology;  Laterality: N/A;  . DECOMPRESSIOIN ULNAR NERVE AND CUBITAL TUNNEL RELEASE Left 07-14-2009   elbow  . 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 INGUINAL HERNIA REPAIR Right 1990's  . KNEE ARTHROSCOPY Right 2015  . LEFT CUBITAL TUNNEL RELEASE    . POSTERIOR LUMBAR FUSION  2014  . SHOULDER ARTHROSCOPY Left 1990's  . TEE WITHOUT CARDIOVERSION N/A 07/27/2015   Procedure: TRANSESOPHAGEAL ECHOCARDIOGRAM (  TEE);  Surgeon: Lelon Perla, MD;  Location: [facility]COPY;  Service: Cardiovascular;  Laterality: N/A;   normal LV function, ef 55-60%,  mild AR and MR, mild dilated ascending aorta (4.2cm),  mild to moderate atherosclerosis  descending aorta,  mild to moderate TR,  negative saline microcavitation study  . THYROIDECTOMY Right 01/20/2015   Procedure: RIGHT THYROIDECTOMY;  Surgeon: Ascencion Dike, MD;  Location: [facility];  Service: ENT;  Laterality: Right;  . TONSILLECTOMY  as child  . TRANSURETHRAL RESECTION OF BLADDER TUMOR N/A 05/15/2016   Procedure:  TRANSURETHRAL RESECTION OF BLADDER TUMOR (TURBT) AND INSTILLATION OF EPIRUBICIN;  Surgeon: Franchot Gallo, MD;  Location: [facility];  Service: Urology;  Laterality: N/A;    There were no vitals filed for this visit.      Subjective Assessment - 10/11/17 0813    Subjective Pt states that his neck and shoulders feel pretty good this morning. He states that he is sore and stiff from the waist down.    Pertinent History ACDF of C3-4, C4-5 on 04/03/17; arthritis, COPD, h/o bladder cancer, thoracic aortic aneurysm without rupture; motivated to participate with PT   Currently in Pain? No/denies   Pain Onset More than a month ago               [facility] Adult PT Treatment/Exercise - 10/11/17 0001      Neck Exercises: Machines for Strengthening   UBE (Upper Arm Bike) x4 mins L2 (x2 mins fwd/retro each)   Other Machines for Strengthening Cables: horiz abd 1 plate x15, rowing 2 plates x15, GH ext 2 plates x15 reps, GH IR/ER x15 reps     Neck Exercises: Seated   Money Limitations body blade horiz and vert perturbations with elbow straight in sitting, 5x10" each     Neck Exercises: Prone   Other Prone Exercise I's, Y's, T's, W's x15 reps each, LUE only     Manual Therapy   Manual Therapy Myofascial release   Manual therapy comments separate rest of session    Myofascial Release in prone, MFR to UT and levator scap on L     Neck Exercises: Stretches   Upper Trapezius Stretch 2 reps;30 seconds   Levator Stretch 2 reps;30 seconds   Other Neck Stretches scalene stretch with towel 1x30 seconds              PT Education - 10/11/17 0818    Education provided Yes          PT Short Term Goals - 10/02/17 0175      PT SHORT TERM GOAL #1   Title Pt will be independent with HEP and perform consistently in order to promote return to PLOF.   Time 3   Period Weeks   Status Achieved     PT SHORT TERM GOAL #2   Title Pt will have improved cervical AROM by 10 deg in  all directions in order to demo improved function and maximize his ability to drive.   Time 3   Period Weeks   Status Partially Met     PT SHORT TERM GOAL #3   Title Pt will have at least a 1/2 grade improvement in MMT of all muscle groups tested to maximize pt's ability to shoot his gun competitively   Time 3   Period Weeks   Status Partially Met           PT Long Term Goals - 10/02/17 1025  PT LONG TERM GOAL #1   Title Pt will have improved cervical AROM to [facility] in all directions to maximize pt's ability to drive with greater ease.    Time 6   Period Weeks   Status Partially Met     PT LONG TERM GOAL #2   Title Pt will have improved MMT by 1 grade in all muscle groups tested in order to maximize his ability to shoot his gun competitively.   Baseline 10/9:   Time 6   Period Weeks   Status On-going     PT LONG TERM GOAL #3   Title Pt will have improved L grip strength to within 5# of the R to demo improved overall function and allow him to lift heavy objects with greater ease.   Time 6   Period Weeks   Status Achieved     PT LONG TERM GOAL #4   Title Pt will report being able to shoot his gun(s) for at least 45 mins without having to take a break in order to demonstrate improved overall strength, endurance, and function.   Baseline 10/9: hasn't tried, will try this week but feels like he could for at least 25 mins   Time 6   Period Weeks   Status On-going               Plan - 10/11/17 5465    Clinical Impression Statement Progressed pt's shoulder strengthening and scap stab work this date with good tolerance. Pt denied any neck or shoulder pain during session, just fatigue. Ended with manual to address restrictions of L levator scap and UT. Continue POC as planned   Rehab Potential Fair   PT Frequency 2x / week   PT Duration 6 weeks   PT Treatment/Interventions ADLs/Self Care Home Management;Cryotherapy;Electrical Stimulation;Moist Heat;Traction;Functional  mobility training;Therapeutic activities;Therapeutic exercise;Balance training;Neuromuscular re-education;Patient/family education;Manual techniques;Passive range of motion;Dry needling;Energy conservation;Taping   PT Next Visit Plan increased serratus strengthening, postural strengthening, scap stabilization and general shoulder strengthening; continue stretching and MFR   PT Home Exercise Plan eval: supine cervical retractions, cervical rotation with towel; 9/26: 3D thoracic excursions, supine serratus punches with DB; 10/9: D2 PNF with RTB, shoulder flex, scaption, abd with RTB standing   Consulted and Agree with Plan of Care Patient      Patient will benefit from skilled therapeutic intervention in order to improve the following deficits and impairments:  Decreased activity tolerance, Decreased range of motion, Decreased strength, Hypomobility, Increased muscle spasms, Increased fascial restricitons, Impaired sensation, Impaired UE functional use, Improper body mechanics, Postural dysfunction  Visit Diagnosis: Cervicalgia  Muscle weakness (generalized)  Abnormal posture  Other symptoms and signs involving the musculoskeletal system     Problem List Patient Active Problem List   Diagnosis Date Noted  . Cervical myelopathy ([place]) 04/03/2017  . Thoracic ascending aortic aneurysm ([place]) 05/09/2016  . Thoracic aortic aneurysm without rupture ([place]) 01/17/2016  . Paroxysmal atrial fibrillation ([place]) 12/17/2015  . S/P partial thyroidectomy 01/20/2015       Geraldine Solar PT, DPT  [facility] [address] [place], [place], [address] Phone: [phone]   Fax:  [phone]  Name: JARMAL LEWELLING MRN: [id number] Date of Birth: 12-31-1940

## 2017-10-15 ENCOUNTER — Encounter (HOSPITAL_COMMUNITY): Payer: Self-pay

## 2017-10-15 ENCOUNTER — Ambulatory Visit (HOSPITAL_COMMUNITY): Payer: Medicare Other

## 2017-10-15 DIAGNOSIS — R29898 Other symptoms and signs involving the musculoskeletal system: Secondary | ICD-10-CM | POA: Diagnosis not present

## 2017-10-15 DIAGNOSIS — R293 Abnormal posture: Secondary | ICD-10-CM | POA: Diagnosis not present

## 2017-10-15 DIAGNOSIS — M542 Cervicalgia: Secondary | ICD-10-CM | POA: Diagnosis not present

## 2017-10-15 DIAGNOSIS — M6281 Muscle weakness (generalized): Secondary | ICD-10-CM | POA: Diagnosis not present

## 2017-10-15 NOTE — Patient Instructions (Signed)
  UPPER TRAP STRETCH - HOLDING CHAIR  While sitting in a chair, hold the seat with one hand and bend your head towards the opposite side for a gentle stretch to the side of the neck.   Left Levator Scapulae Stretch  Begin by retracting your head back into a gentle chin tuck position with shoulder blades back. Next, place left hand under your left leg/upper thigh with your palm facing up and gently lean your nose toward your right armpit by looking to the right and then nodding down.  You should be looking towards your opposite pocket of the right side.  Gentle is important, do not go into pain.      Perform these stretches 1-2x/day, 3-5 stretches holding for 30 - 60 seconds

## 2017-10-15 NOTE — Therapy (Signed)
[place], [place], [address] Phone: [phone]   Fax:  [phone]  Physical Therapy Treatment  Patient Details  Name: Kyle Owen MRN: [id number] Date of Birth: 05/03/1941 Referring Provider: Peggyann Shoals, MD  Encounter Date: 10/15/2017      PT End of Session - 10/15/17 0900    Visit Number 8   Number of Visits 13   Date for PT Re-Evaluation 2017/10/23   Authorization Type Medicare Part A and B (Secondary: [place])    Authorization Time Period Sep 11, 2017 to 10/23/2017 (g-codes done on 04-Feb-2023 visit)   Authorization - Visit Number 8   Authorization - Number of Visits 10   PT Start Time 0900   PT Stop Time 0940   PT Time Calculation (min) 40 min   Activity Tolerance Patient tolerated treatment well;No increased pain   Behavior During Therapy WFL for tasks assessed/performed      Past Medical History:  Diagnosis Date  . Arthritis    DJD right knee  . Benign prostatic hypertrophy with urinary frequency   . Bladder cancer ([place])   . COPD with emphysema ([place])   . Depression   . Dyspnea    when walking  . Dysrhythmia    a fib  . History of colon polyps   . History of pneumonia    hx Recurrent -- last bout Dec 2016 CAP-- per pt resolved  . 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 Hyperlipidemia   . 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 Nephrolithiasis    right non-obstructive  per CT 05-02-2016  . Ocular migraine    controlled w/ verapamil  . PAF (paroxysmal atrial fibrillation) ([place])    followed by AFIB clinic-- cardiologist-- dr Marlou Porch  . Pneumonia    history of pnu.  . Pulmonary nodule, left    left lower  lobe x2 per CT 01-20-2016  . Renal cyst, left   . Thoracic aortic aneurysm without rupture ([place])    ascending -- ct chest 01/10/16  4.8cm  . Wears hearing aid    bilateral-- wear at times    Past Surgical History:  Procedure Laterality Date  . ANTERIOR CERVICAL DECOMP/DISCECTOMY FUSION N/A 04/03/2017   Procedure: Cervical three-four, Cervical four-five Anterior cervical decompression/discectomy/fusion;  Surgeon: Erline Levine, MD;  Location: [facility];  Service: Neurosurgery;  Laterality: N/A;  . CATARACT EXTRACTION W/ INTRAOCULAR LENS IMPLANT Right 2016  . CATARACT EXTRACTION W/PHACO  12/16/2012   Procedure: CATARACT EXTRACTION PHACO AND INTRAOCULAR LENS PLACEMENT (IOC);  Surgeon: Tonny Branch, MD;  [facility];  Service: Ophthalmology;  Laterality: Left;  CDE:17.31  . COLONOSCOPY  10/03/2011   Procedure: COLONOSCOPY;  Surgeon: Jamesetta So;  [facility];  Service: Gastroenterology;  Laterality: N/A;  . DECOMPRESSIOIN ULNAR NERVE AND CUBITAL TUNNEL RELEASE Left 07-14-2009   elbow  . 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 INGUINAL HERNIA REPAIR Right 1990's  . KNEE ARTHROSCOPY Right 2015  . LEFT CUBITAL TUNNEL RELEASE    . POSTERIOR LUMBAR FUSION  2014  . SHOULDER ARTHROSCOPY Left 1990's  . TEE WITHOUT CARDIOVERSION N/A 07/27/2015   Procedure: TRANSESOPHAGEAL ECHOCARDIOGRAM (  TEE);  Surgeon: Lelon Perla, MD;  Location: [facility];  Service: Cardiovascular;  Laterality: N/A;   normal LV function, ef 55-60%,  mild AR and MR, mild dilated ascending aorta (4.2cm),  mild to moderate atherosclerosis  descending aorta,  mild to moderate TR,  negative saline microcavitation study  . THYROIDECTOMY Right 01/20/2015   Procedure: RIGHT THYROIDECTOMY;  Surgeon: Ascencion Dike, MD;  Location: [facility];  Service: ENT;  Laterality: Right;  . TONSILLECTOMY  as child  . TRANSURETHRAL RESECTION OF BLADDER TUMOR N/A 05/15/2016   Procedure:  TRANSURETHRAL RESECTION OF BLADDER TUMOR (TURBT) AND INSTILLATION OF EPIRUBICIN;  Surgeon: Franchot Gallo, MD;  Location: [facility];  Service: Urology;  Laterality: N/A;    There were no vitals filed for this visit.      Subjective Assessment - 10/15/17 0900    Subjective Pt states that he still has neck pain when he is getting dressed in the mornings. He denies any shoulder/neck pain right now, just some of his usual LBP.   Pertinent History ACDF of C3-4, C4-5 on 04/03/17; arthritis, COPD, h/o bladder cancer, thoracic aortic aneurysm without rupture; motivated to participate with PT   Currently in Pain? No/denies   Pain Onset More than a month ago                [facility] Adult PT Treatment/Exercise - 10/15/17 0001      Neck Exercises: Machines for Strengthening   UBE (Upper Arm Bike) x4 mins L2 (x2 mins fwd/retro each)   Other Machines for Strengthening Cables: horiz abd 1 plate x15, rowing 2 plates x15, GH ext 2 plates x15 reps, GH IR/ER x15 reps     Neck Exercises: Standing   Wall Push Ups 10 reps   Wall Push Ups Limitations with plus with GTB   UE D1 Limitations x10 with RTB   UE D2 Limitations x10 with RTB   Wall Wash 2x10 with 2#   Other Standing Exercises Y's on wall with liftoff with RTB x10 reps     Neck Exercises: Prone   Other Prone Exercise I's, Y's, T's, W's x10 reps each, LUE only     Manual Therapy   Manual Therapy Myofascial release   Manual therapy comments separate rest of session    Joint Mobilization Grade III PAs to C2-T2   Myofascial Release in prone, MFR to UT and levator scap on L     Neck Exercises: Stretches   Upper Trapezius Stretch 2 reps;30 seconds   Levator Stretch 2 reps;30 seconds              PT Education - 10/15/17 0904    Education provided Yes   Education Details added UT and levator scap stretches to HEP   Person(s) Educated Patient   Methods Explanation;Demonstration   Comprehension Verbalized  understanding;Returned demonstration          PT Short Term Goals - 10/02/17 0822      PT SHORT TERM GOAL #1   Title Pt will be independent with HEP and perform consistently in order to promote return to PLOF.   Time 3   Period Weeks   Status Achieved     PT SHORT TERM GOAL #2   Title Pt will have improved cervical AROM by 10 deg in all directions in order to demo improved function and maximize his ability to drive.   Time 3   Period Weeks   Status Partially Met  PT SHORT TERM GOAL #3   Title Pt will have at least a 1/2 grade improvement in MMT of all muscle groups tested to maximize pt's ability to shoot his gun competitively   Time 3   Period Weeks   Status Partially Met           PT Long Term Goals - 10/02/17 8338      PT LONG TERM GOAL #1   Title Pt will have improved cervical AROM to [facility] in all directions to maximize pt's ability to drive with greater ease.    Time 6   Period Weeks   Status Partially Met     PT LONG TERM GOAL #2   Title Pt will have improved MMT by 1 grade in all muscle groups tested in order to maximize his ability to shoot his gun competitively.   Baseline 10/9:   Time 6   Period Weeks   Status On-going     PT LONG TERM GOAL #3   Title Pt will have improved L grip strength to within 5# of the R to demo improved overall function and allow him to lift heavy objects with greater ease.   Time 6   Period Weeks   Status Achieved     PT LONG TERM GOAL #4   Title Pt will report being able to shoot his gun(s) for at least 45 mins without having to take a break in order to demonstrate improved overall strength, endurance, and function.   Baseline 10/9: hasn't tried, will try this week but feels like he could for at least 25 mins   Time 6   Period Weeks   Status On-going               Plan - 10/15/17 2505    Clinical Impression Statement Session continued to focus on shoulder stabilization and overall strengthening. Pt demo'd  fatigue during. He reported c/o neck pain with getting dressed; PT explained that it is likely due to his neck posture and requiring those muscles to stabilize both his neck and shoulder simultaneously and he verbalized understanding. Ended with manual to address soft tissue restrictions. Pt due for reassessment next visit.   Rehab Potential Fair   PT Frequency 2x / week   PT Duration 6 weeks   PT Treatment/Interventions ADLs/Self Care Home Management;Cryotherapy;Electrical Stimulation;Moist Heat;Traction;Functional mobility training;Therapeutic activities;Therapeutic exercise;Balance training;Neuromuscular re-education;Patient/family education;Manual techniques;Passive range of motion;Dry needling;Energy conservation;Taping   PT Next Visit Plan reassess   PT Home Exercise Plan eval: supine cervical retractions, cervical rotation with towel; 9/26: 3D thoracic excursions, supine serratus punches with DB; 10/9: D2 PNF with RTB, shoulder flex, scaption, abd with RTB standing; 10/22: UT and levator scap stretches   Consulted and Agree with Plan of Care Patient      Patient will benefit from skilled therapeutic intervention in order to improve the following deficits and impairments:  Decreased activity tolerance, Decreased range of motion, Decreased strength, Hypomobility, Increased muscle spasms, Increased fascial restricitons, Impaired sensation, Impaired UE functional use, Improper body mechanics, Postural dysfunction  Visit Diagnosis: Cervicalgia  Muscle weakness (generalized)  Abnormal posture  Other symptoms and signs involving the musculoskeletal system     Problem List Patient Active Problem List   Diagnosis Date Noted  . Cervical myelopathy ([place]) 04/03/2017  . Thoracic ascending aortic aneurysm ([place]) 05/09/2016  . 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 Paroxysmal atrial fibrillation ([place]) 12/17/2015  . S/P partial thyroidectomy 01/20/2015  Geraldine Solar PT, [place] [address] [place], [place], [address] Phone: [phone]   Fax:  [phone]  Name: Kyle Owen MRN: [id number] Date of Birth: [birth year]

## 2017-10-16 DIAGNOSIS — M48062 Spinal stenosis, lumbar region with neurogenic claudication: Secondary | ICD-10-CM | POA: Diagnosis not present

## 2017-10-16 DIAGNOSIS — M4316 Spondylolisthesis, lumbar region: Secondary | ICD-10-CM | POA: Diagnosis not present

## 2017-10-16 DIAGNOSIS — M47816 Spondylosis without myelopathy or radiculopathy, lumbar region: Secondary | ICD-10-CM | POA: Diagnosis not present

## 2017-10-17 ENCOUNTER — Ambulatory Visit (HOSPITAL_COMMUNITY): Payer: Medicare Other

## 2017-10-17 ENCOUNTER — Encounter (HOSPITAL_COMMUNITY): Payer: Self-pay

## 2017-10-17 DIAGNOSIS — R293 Abnormal posture: Secondary | ICD-10-CM | POA: Diagnosis not present

## 2017-10-17 DIAGNOSIS — M6281 Muscle weakness (generalized): Secondary | ICD-10-CM | POA: Diagnosis not present

## 2017-10-17 DIAGNOSIS — R29898 Other symptoms and signs involving the musculoskeletal system: Secondary | ICD-10-CM | POA: Diagnosis not present

## 2017-10-17 DIAGNOSIS — M542 Cervicalgia: Secondary | ICD-10-CM | POA: Diagnosis not present

## 2017-10-17 NOTE — Therapy (Signed)
[place], [place], [address] Phone: [phone]   Fax:  [phone]  Physical Therapy Treatment/Discharge Summary  Patient Details  Name: Kyle Owen MRN: [id number] Date of Birth: 09/02/1941 Referring Provider: Peggyann Shoals, MD  Encounter Date: 10-26-17      PT End of Session - October 26, 2017 1121    Visit Number 9   Number of Visits 13   Date for PT Re-Evaluation 2017/10/26   Authorization Type Medicare Part A and B (Secondary: [place])    Authorization Time Period 2017/09/14 to 26-Oct-2017 (g-codes done on 02/07/23 visit)   Authorization - Visit Number 9   Authorization - Number of Visits 10   PT Start Time 1118   PT Stop Time 1147   PT Time Calculation (min) 29 min   Activity Tolerance Patient tolerated treatment well;No increased pain   Behavior During Therapy WFL for tasks assessed/performed      Past Medical History:  Diagnosis Date  . Arthritis    DJD right knee  . Benign prostatic hypertrophy with urinary frequency   . Bladder cancer ([place])   . COPD with emphysema ([place])   . Depression   . Dyspnea    when walking  . Dysrhythmia    a fib  . History of colon polyps   . History of pneumonia    hx Recurrent -- last bout Dec 2016 CAP-- per pt resolved  . 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 Hyperlipidemia   . 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 Nephrolithiasis    right non-obstructive  per CT 05-02-2016  . Ocular migraine    controlled w/ verapamil  . PAF (paroxysmal atrial fibrillation) ([place])    followed by AFIB clinic-- cardiologist-- dr Marlou Porch  . Pneumonia    history of pnu.  . Pulmonary nodule,  left    left lower lobe x2 per CT 01-20-2016  . Renal cyst, left   . Thoracic aortic aneurysm without rupture ([place])    ascending -- ct chest 01/10/16  4.8cm  . Wears hearing aid    bilateral-- wear at times    Past Surgical History:  Procedure Laterality Date  . ANTERIOR CERVICAL DECOMP/DISCECTOMY FUSION N/A 04/03/2017   Procedure: Cervical three-four, Cervical four-five Anterior cervical decompression/discectomy/fusion;  Surgeon: Erline Levine, MD;  Location: [facility];  Service: Neurosurgery;  Laterality: N/A;  . CATARACT EXTRACTION W/ INTRAOCULAR LENS IMPLANT Right 2016  . CATARACT EXTRACTION W/PHACO  12/16/2012   Procedure: CATARACT EXTRACTION PHACO AND INTRAOCULAR LENS PLACEMENT (IOC);  Surgeon: Tonny Branch, MD;  [facility];  Service: Ophthalmology;  Laterality: Left;  CDE:17.31  . COLONOSCOPY  10/03/2011   Procedure: COLONOSCOPY;  Surgeon: Jamesetta So;  [facility];  Service: Gastroenterology;  Laterality: N/A;  . DECOMPRESSIOIN ULNAR NERVE AND CUBITAL TUNNEL RELEASE Left 07-14-2009   elbow  . 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 INGUINAL HERNIA REPAIR Right 1990's  . KNEE ARTHROSCOPY Right 2015  . LEFT CUBITAL TUNNEL RELEASE    . POSTERIOR LUMBAR FUSION  2014  . SHOULDER ARTHROSCOPY Left 1990's  . TEE WITHOUT CARDIOVERSION N/A 07/27/2015   Procedure: TRANSESOPHAGEAL  ECHOCARDIOGRAM (TEE);  Surgeon: Lelon Perla, MD;  Location: [facility] ENDOSCOPY;  Service: Cardiovascular;  Laterality: N/A;   normal LV function, ef 55-60%,  mild AR and MR, mild dilated ascending aorta (4.2cm),  mild to moderate atherosclerosis  descending aorta,  mild to moderate TR,  negative saline microcavitation study  . THYROIDECTOMY Right 01/20/2015   Procedure: RIGHT THYROIDECTOMY;  Surgeon: Ascencion Dike, MD;  Location: [facility];  Service: ENT;  Laterality: Right;  . TONSILLECTOMY  as child  . TRANSURETHRAL RESECTION OF BLADDER TUMOR N/A 05/15/2016    Procedure: TRANSURETHRAL RESECTION OF BLADDER TUMOR (TURBT) AND INSTILLATION OF EPIRUBICIN;  Surgeon: Franchot Gallo, MD;  Location: [facility];  Service: Urology;  Laterality: N/A;    There were no vitals filed for this visit.      Subjective Assessment - 10/17/17 1122    Subjective Pt reports that his neck hurts if he looks up. His R hip is bothering him.   Pertinent History ACDF of C3-4, C4-5 on 04/03/17; arthritis, COPD, h/o bladder cancer, thoracic aortic aneurysm without rupture; motivated to participate with PT   Currently in Pain? No/denies  no pain in neck   Pain Onset More than a month ago            [facility] PT Assessment - 10/17/17 0001      AROM   Cervical Flexion 43  was 18   Cervical Extension 23  was 18   Cervical - Right Side Bend 28  was 18   Cervical - Left Side Bend 25  was 20   Cervical - Right Rotation 84  was 66   Cervical - Left Rotation 64  was 41     Strength   Right Shoulder Flexion 5/5  was 4+   Right Shoulder ABduction 5/5  was 4+   Left Shoulder Flexion 4+/5  was 4+   Left Shoulder ABduction 4+/5  was 4          Patient education: Discharge plans, continue HEP, self-STM with tennis ball, continue gym routine         PT Short Term Goals - 10/17/17 1122      PT SHORT TERM GOAL #1   Title Pt will be independent with HEP and perform consistently in order to promote return to PLOF.   Time 3   Period Weeks   Status Achieved     PT SHORT TERM GOAL #2   Title Pt will have improved cervical AROM by 10 deg in all directions in order to demo improved function and maximize his ability to drive.   Baseline 10/24: all AROM improved by 10 deg or > except for cervical ext and L LF improved by 5 deg   Time 3   Period Weeks   Status Partially Met     PT SHORT TERM GOAL #3   Title Pt will have at least a 1/2 grade improvement in MMT of all muscle groups tested to maximize pt's ability to shoot his gun competitively    Time 3   Period Weeks   Status Achieved           PT Long Term Goals - 10/17/17 1123      PT LONG TERM GOAL #1   Title Pt will have improved cervical AROM to [facility] in all directions to maximize pt's ability to drive with greater ease.    Baseline 10/24: pt's cervical AROM WFL    Time 6   Period  Weeks   Status Achieved     PT LONG TERM GOAL #2   Title Pt will have improved MMT by 1 grade in all muscle groups tested in order to maximize his ability to shoot his gun competitively.   Baseline --   Time 6   Period Weeks   Status Partially Met     PT LONG TERM GOAL #3   Title Pt will have improved L grip strength to within 5# of the R to demo improved overall function and allow him to lift heavy objects with greater ease.   Time 6   Period Weeks   Status Achieved     PT LONG TERM GOAL #4   Title Pt will report being able to shoot his gun(s) for at least 45 mins without having to take a break in order to demonstrate improved overall strength, endurance, and function.   Baseline 11/11/2023: hasn't tried, will try this week but feels like he could for at least 25 mins   Time 6   Period Weeks   Status On-going               Plan - November 10, 2017 1149    Clinical Impression Statement PT reassessed pt's goals and outcome measures this date. Pt has made great progress towards his goals as illustrated above. Pt's AROM has significantly improved, though his extension and LF only improved by 5 deg; however, he is [facility] throughout. Pt states that he feels like he has made great strides in his therapy; he can now drive comfortably, his LUE strength has improved, and his numbness is 99% improved. He is currently working out at [employer] 2x/week as well. He was educated on how to perform self-STM to cervical and periscapular musculature with tennis ball to further promote ROM and decreased pain and he demo'd understanding. At this time, pt is ready for discharge due to progress made and he does not  require anymore skilled PT intervention. Pt agreeable to d/c and he was encouraged to continue his HEP, gradually return to his prior activities, and continue his gym routine and he verbalized understanding.   Rehab Potential Fair   PT Frequency 2x / week   PT Duration 6 weeks   PT Treatment/Interventions ADLs/Self Care Home Management;Cryotherapy;Electrical Stimulation;Moist Heat;Traction;Functional mobility training;Therapeutic activities;Therapeutic exercise;Balance training;Neuromuscular re-education;Patient/family education;Manual techniques;Passive range of motion;Dry needling;Energy conservation;Taping   PT Next Visit Plan discharge   PT Home Exercise Plan eval: supine cervical retractions, cervical rotation with towel; 9/26: 3D thoracic excursions, supine serratus punches with DB; 10/9: D2 PNF with RTB, shoulder flex, scaption, abd with RTB standing; 10/22: UT and levator scap stretches; 11-Nov-2023: tennis ball for self-STM, continue gym routine   Consulted and Agree with Plan of Care Patient      Patient will benefit from skilled therapeutic intervention in order to improve the following deficits and impairments:  Decreased activity tolerance, Decreased range of motion, Decreased strength, Hypomobility, Increased muscle spasms, Increased fascial restricitons, Impaired sensation, Impaired UE functional use, Improper body mechanics, Postural dysfunction  Visit Diagnosis: Cervicalgia  Muscle weakness (generalized)  Abnormal posture  Other symptoms and signs involving the musculoskeletal system       G-Codes - 11-10-2017 1154    Functional Assessment Tool Used (Outpatient Only) FOTO, clinical judgement, MMT, AROM, posture   Functional Limitation Carrying, moving and handling objects   Carrying, Moving and Handling Objects Goal Status ([id number]) At least 1 percent but less than 20 percent impaired, limited or restricted  Carrying, Moving and Handling Objects Discharge Status [phone]) At least  1 percent but less than 20 percent impaired, limited or restricted      Problem List Patient Active Problem List   Diagnosis Date Noted  . Cervical myelopathy ([place]) 04/03/2017  . Thoracic ascending aortic aneurysm ([place]) 05/09/2016  . Thoracic aortic aneurysm without rupture ([place]) 01/17/2016  . Paroxysmal atrial fibrillation ([place]) 12/17/2015  . S/P partial thyroidectomy 01/20/2015     PHYSICAL THERAPY DISCHARGE SUMMARY  Visits from Start of Care: 9  Current functional level related to goals / functional outcomes: See clinical impression above   Remaining deficits: See clinical impression above   Education / Equipment: Continue HEP, self-STM with tennis ball, continue gym routine Plan: Patient agrees to discharge.  Patient goals were partially met. Patient is being discharged due to meeting the stated rehab goals.  ?????       Geraldine Solar PT, [place] [address] [place], [place], [address] Phone: [phone]   Fax:  [phone]  Name: Kyle Owen MRN: [id number] Date of Birth: 04-Mar-1941

## 2017-10-19 ENCOUNTER — Ambulatory Visit (INDEPENDENT_AMBULATORY_CARE_PROVIDER_SITE_OTHER): Payer: Medicare Other | Admitting: Cardiology

## 2017-10-19 ENCOUNTER — Encounter: Payer: Self-pay | Admitting: Cardiology

## 2017-10-19 VITALS — BP 120/82 | HR 66 | Ht 73.0 in | Wt 214.8 lb

## 2017-10-19 DIAGNOSIS — I712 Thoracic aortic aneurysm, without rupture: Secondary | ICD-10-CM | POA: Diagnosis not present

## 2017-10-19 DIAGNOSIS — J449 Chronic obstructive pulmonary disease, unspecified: Secondary | ICD-10-CM | POA: Diagnosis not present

## 2017-10-19 DIAGNOSIS — I639 Cerebral infarction, unspecified: Secondary | ICD-10-CM

## 2017-10-19 DIAGNOSIS — Z01810 Encounter for preprocedural cardiovascular examination: Secondary | ICD-10-CM | POA: Diagnosis not present

## 2017-10-19 DIAGNOSIS — I480 Paroxysmal atrial fibrillation: Secondary | ICD-10-CM

## 2017-10-19 DIAGNOSIS — I7121 Aneurysm of the ascending aorta, without rupture: Secondary | ICD-10-CM

## 2017-10-19 NOTE — Progress Notes (Signed)
Cardiology Office Note    Date:  10/19/2017   ID:  Kyle Owen, DOB 09/03/1941, MRN [id number]  PCP:  Asencion Noble, MD  Cardiologist:   Candee Furbish, MD , EP-Dr. Rayann Heman    History of Present Illness:  Kyle Owen is a 76 y.o. male previously seen by Dr. 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 Darcey Nora here to establish care. Previously had only seen a letter physiology clinic. He was previously on verapamil for ocular migraines. Low atrial fibrillation burden of 2.5%.  Bladder cancer - COPD - baseline SOB. Uphill walking. No chest pain. He is able to complete greater than 4 METS of activity without difficulty.   68min AFIB episode and 15 min episode recorded on monitor. No recent syncope (series did have one episode when raising his arms above his head in radiology last year, orthostatic). Memory issues.   Married New Years Eve 2016. Wife here.   Because of prior TIA symptoms, chadsvasc score of at least 4.   Has chronic back pain, leg pain. Cramping at night.  Has runny nose when exercising.  10/19/17-overall he has been doing fairly well, stable. Dr. Carloyn Manner. Dec 3 back surgery. Cramp/ hands Mail box winded. COPD   Past Medical History:  Diagnosis Date  . Arthritis    DJD right knee  . Benign prostatic hypertrophy with urinary frequency   . Bladder cancer ([place])   . COPD with emphysema ([place])   . Depression   . Dyspnea    when walking  . Dysrhythmia    a fib  . History of colon polyps   . History of pneumonia    hx Recurrent -- last bout Dec 2016 CAP-- per pt resolved  . 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 Hyperlipidemia   . 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 Nephrolithiasis    right non-obstructive  per CT 05-02-2016  . Ocular migraine    controlled w/ verapamil  . PAF (paroxysmal atrial fibrillation) ([place])    followed by AFIB clinic-- cardiologist-- dr Marlou Porch  . Pneumonia    history of pnu.  . Pulmonary nodule, left    left lower lobe x2 per CT 01-20-2016  . Renal cyst, left   . Thoracic aortic aneurysm without rupture ([place])    ascending -- ct chest 01/10/16  4.8cm  . Wears hearing aid    bilateral-- wear at times    Past Surgical History:  Procedure Laterality Date  . ANTERIOR CERVICAL DECOMP/DISCECTOMY FUSION N/A 04/03/2017   Procedure: Cervical three-four, Cervical four-five Anterior cervical decompression/discectomy/fusion;  Surgeon: Erline Levine, MD;  Location: [place];  Service: Neurosurgery;  Laterality: N/A;  . CATARACT EXTRACTION W/ INTRAOCULAR LENS IMPLANT Right 2016  . CATARACT EXTRACTION W/PHACO  12/16/2012   Procedure: CATARACT EXTRACTION PHACO AND INTRAOCULAR LENS PLACEMENT (IOC);  Surgeon: Tonny Branch, MD;  [facility];  Service: Ophthalmology;  Laterality: Left;  CDE:17.31  . COLONOSCOPY  10/03/2011   Procedure: COLONOSCOPY;  Surgeon: Jamesetta So;  [facility];  Service: Gastroenterology;  Laterality: N/A;  . DECOMPRESSIOIN ULNAR NERVE AND CUBITAL TUNNEL RELEASE Left 07-14-2009   elbow  .  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 INGUINAL HERNIA REPAIR Right 1990's  . KNEE ARTHROSCOPY Right 2015  . LEFT CUBITAL TUNNEL RELEASE    . POSTERIOR LUMBAR FUSION  2014  . SHOULDER ARTHROSCOPY Left 1990's  . TEE WITHOUT CARDIOVERSION N/A 07/27/2015   Procedure: TRANSESOPHAGEAL ECHOCARDIOGRAM (TEE);  Surgeon: Lelon Perla, MD;  Location: [facility] END[facility];  Service: Cardiovascular;  Laterality: N/A;   normal LV function, ef 55-60%,  mild AR and MR, mild  dilated ascending aorta (4.2cm),  mild to moderate atherosclerosis  descending aorta,  mild to moderate TR,  negative saline microcavitation study  . THYROIDECTOMY Right 01/20/2015   Procedure: RIGHT THYROIDECTOMY;  Surgeon: Ascencion Dike, MD;  Location: [place];  Service: ENT;  Laterality: Right;  . TONSILLECTOMY  as child  . TRANSURETHRAL RESECTION OF BLADDER TUMOR N/A 05/15/2016   Procedure: TRANSURETHRAL RESECTION OF BLADDER TUMOR (TURBT) AND INSTILLATION OF EPIRUBICIN;  Surgeon: Franchot Gallo, MD;  Location[facility];  Service: Urology;  Laterality: N/A;    Current Medications: Outpatient Medications Prior to Visit  Medication Sig Dispense Refill  . acetaminophen (TYLENOL) 500 MG tablet Take 500 mg by mouth daily.     Marland Kitchen atorvastatin (LIPITOR) 20 MG tablet TAKE 1 TABLET BY MOUTH DAILY. 90 tablet 3  . budesonide-formoterol (SYMBICORT) 160-4.5 MCG/ACT inhaler Inhale 2 puffs into the lungs 2 (two) times daily.    Marland Kitchen ELIQUIS 5 MG TABS tablet TAKE (1) TABLET BY MOUTH TWICE DAILY. 60 tablet 11  . finasteride (PROSCAR) 5 MG tablet Take 5 mg by mouth every morning.     . furosemide (LASIX) 20 MG tablet Take 1 tablet (20 mg total) by mouth daily. 90 tablet 3  . Magnesium 400 MG CAPS Take 400 mg by mouth every morning.     . Multiple Vitamins-Minerals (CENTRUM SILVER PO) Take 1 tablet by mouth every morning.     . potassium chloride (K-DUR) 10 MEQ tablet Take 1 tablet (10 mEq total) by mouth daily. When you take Lasix 90 tablet 3  . sertraline (ZOLOFT) 50 MG tablet Take 50 mg by mouth every morning.     . VENTOLIN HFA 108 (90 BASE) MCG/ACT inhaler Inhale 2 puffs into the lungs Every 4 hours as needed for wheezing or shortness of breath. Reported on 12/23/2015    . verapamil (CALAN-SR) 240 MG CR tablet Take 240 mg by mouth every morning.      No facility-administered medications prior to visit.      Allergies:   Flomax [tamsulosin hcl]   Social History   Social History  .  Marital status: Married    Spouse name: N/A  . Number of children: 3  . Years of education: 14   Occupational History  . [employer]     retired   Social History Main Topics  . Smoking status: Former Smoker    Packs/day: 1.00    Years: 45.00    Types: Cigarettes    Quit date: 09/26/2010  . Smokeless tobacco: Never Used  . Alcohol use No  . Drug use: No  . Sexual activity: Not Asked   Other Topics Concern  . None   Social History Narrative   Widowed, lives with dog, [place] in [place], wife passed from Parkinson's disease 3 1/2 yrs ago   1 cup coffee daily     Family History:  The patient's family history includes Breast cancer in  his sister; Stroke in his brother and sister.   ROS:   Please see the history of present illness.    ROS All other systems reviewed and are negative.   PHYSICAL EXAM:   VS:  BP 120/82   Pulse 66   Ht 6\' 1"  (1.854 m)   Wt 214 lb 12.8 oz (97.4 kg)   SpO2 91%   BMI 28.34 kg/m    GEN: Well nourished, well developed, in no acute distress  HEENT: normal  Neck: no JVD, carotid bruits, or masses Cardiac: RRR; no murmurs, rubs, or gallops,no edema  Respiratory:  clear to auscultation bilaterally, normal work of breathing GI: soft, nontender, nondistended, + BS MS: no deformity or atrophy  Skin: warm and dry, no rash Neuro:  Alert and Oriented x 3, Strength and sensation are intact Psych: euthymic mood, full affect  Wt Readings from Last 3 Encounters:  10/19/17 214 lb 12.8 oz (97.4 kg)  08/15/17 214 lb (97.1 kg)  05/30/17 213 lb 6.4 oz (96.8 kg)      Studies/Labs Reviewed:   EKG:  EKG is not ordered today.  01/31/16-sinus rhythm, 78, septal infarct pattern personally viewed  Recent Labs: 03/28/2017: Hemoglobin 14.1; Platelets 152 05/30/2017: BUN 21; Creatinine, Ser 1.18; NT-Pro BNP 480; Potassium 4.4; Sodium 142   Lipid Panel    Component Value Date/Time   CHOL 225 (H) 06/09/2015 1040   TRIG 100 06/09/2015 1040   HDL 68 06/09/2015  1040   CHOLHDL 3.3 06/09/2015 1040   LDLCALC 137 (H) 06/09/2015 1040    Additional studies/ records that were reviewed today include:  CT of chest January 2017-4.8 cm ascending aortic aneurysm  Echocardiogram 06/10/15: - Left ventricle: The cavity size was normal. Wall thickness was  normal. Systolic function was normal. The estimated ejection  fraction was in the range of 60% to 65%. Wall motion was normal;  there were no regional wall motion abnormalities. Doppler  parameters are consistent with abnormal left ventricular  relaxation (grade 1 diastolic dysfunction). The E/e&' ratio is <8,  suggesting normal LV filling pressure. - Aortic valve: Trileaflet. Sclerosis without stenosis. There was  trivial regurgitation. - Left atrium: The atrium was normal in size. - Right atrium: Moderately dilated at 25 cm2. - Tricuspid valve: There was mild regurgitation. - Pulmonary arteries: PA peak pressure: 29 mm Hg (S). - Inferior vena cava: The vessel was normal in size. The  respirophasic diameter changes were in the normal range (= 50%),  consistent with normal central venous pressure.  Impressions:  - Compared to a prior echo in 2015, there are few changes. RVSP is  lower at 29 mmHg.  06/08/17  Nuclear stress EF: 66%.  There was no ST segment deviation noted during stress.  The study is normal.  This is a low risk study.  The left ventricular ejection fraction is hyperdynamic (>65%).   Thinning of the inferior wall at mid and basal level thought to be from extra cardiac /diaphragmatic attenuation No ischemia and no RWMA;s EF 66%  Prior lab work, office notes, EKG, echo and reviewed    ASSESSMENT:    1. Paroxysmal atrial fibrillation (HCC)   2. Thoracic ascending aortic aneurysm ([place])   3. Chronic obstructive pulmonary disease, unspecified COPD type ([place])   4. Pre-operative cardiovascular examination      PLAN:  In order of problems listed  above:  Preoperative cardiac risk stratification  -Recent nuclear stress test low risk, reassuring with normal ejection fraction and no significant  perfusion defects.  He is not having any active anginal symptoms.  He does have mild shortness of breath with activity, when walking to mailbox for instance.  This is chronic, unchanged.  His atrial fibrillation is quite infrequent, approximately 3%.    -He may proceed with back surgery with low overall cardiac risk.  -Please hold his Eliquis for 3 days prior to back surgery.  No bridge.  -Be aware that it would not be unusual to see postoperative atrial fibrillation.  Continue with verapamil for rate control.  -Dr. Carloyn Manner  Paroxysmal atrial fibrillation  - On Eliquis, CHADS VASC - 4 (prior TIA)   - Low burden noted on Linq (2.5%) (11hr, 18min), ASYMPTOMATIC  - On verapamil for rate control as well as migraine prophylaxis.  - Prior cryptogenic stroke, no recent strokelike symptoms.  History of cryptogenic stroke  - On anticoagulation now that atrial fibrillation has been discovered continue with Eliquis.  Doing well.  Ascending aortic aneurysm, thoracic  - Followed by Dr. Darcey Nora (he is seeing him every 6 months)  - 4.8 cm 2017. CT scan. Personally viewed. No change.  -4.7 cm 2018  Hyperlipidemia  - He does have baseline cramping.  -Continue to encourage statin use.  Atorvastatin.  Bladder cancer  -  Stable, no change  COPD  - At baseline. Former smoker, quit in 2011.  Continue with inhaler  Medication Adjustments/Labs and Tests Ordered: Current medicines are reviewed at length with the patient today.  Concerns regarding medicines are outlined above.  Medication changes, Labs and Tests ordered today are listed in the Patient Instructions below. Patient Instructions  Medication Instructions:  The current medical regimen is effective;  continue present plan and medications.  Follow-Up: Follow up in 1 year with Dr. Marlou Porch.  You will  receive a letter in the mail 2 months before you are due.  Please call us when you receive this letter to schedule your follow up appointment.  If you need a refill on your cardiac medications before your next appointment, please call your pharmacy.  Thank you for choosing [facility]!!    You have been cleared for your upcoming back surgery.    Signed, Candee Furbish, MD  10/19/2017 10:29 AM    [facility] [address], [place], [place]  [address] Phone: [phone]; Fax: [phone]

## 2017-10-19 NOTE — Patient Instructions (Signed)
Medication Instructions:  The current medical regimen is effective;  continue present plan and medications.  Follow-Up: Follow up in 1 year with Dr. Marlou Porch.  You will receive a letter in the mail 2 months before you are due.  Please call us when you receive this letter to schedule your follow up appointment.  If you need a refill on your cardiac medications before your next appointment, please call your pharmacy.  Thank you for choosing [facility]!!    You have been cleared for your upcoming back surgery.

## 2017-10-29 ENCOUNTER — Ambulatory Visit (INDEPENDENT_AMBULATORY_CARE_PROVIDER_SITE_OTHER): Payer: Medicare Other | Admitting: *Deleted

## 2017-10-29 DIAGNOSIS — I639 Cerebral infarction, unspecified: Secondary | ICD-10-CM

## 2017-10-30 LAB — CUP PACEART REMOTE DEVICE CHECK
Date Time Interrogation Session: 20181104231124
Implantable Pulse Generator Implant Date: 20160816

## 2017-10-30 NOTE — Progress Notes (Signed)
Carelink Summary Report / Loop Recorder 

## 2017-11-27 ENCOUNTER — Ambulatory Visit (INDEPENDENT_AMBULATORY_CARE_PROVIDER_SITE_OTHER): Payer: Medicare Other | Admitting: *Deleted

## 2017-11-27 DIAGNOSIS — I639 Cerebral infarction, unspecified: Secondary | ICD-10-CM

## 2017-11-28 NOTE — Progress Notes (Signed)
Carelink Summary Report / Loop Recorder 

## 2017-12-05 DIAGNOSIS — J440 Chronic obstructive pulmonary disease with acute lower respiratory infection: Secondary | ICD-10-CM | POA: Diagnosis not present

## 2017-12-05 DIAGNOSIS — J449 Chronic obstructive pulmonary disease, unspecified: Secondary | ICD-10-CM | POA: Diagnosis not present

## 2017-12-05 DIAGNOSIS — N189 Chronic kidney disease, unspecified: Secondary | ICD-10-CM | POA: Diagnosis not present

## 2017-12-05 DIAGNOSIS — M4316 Spondylolisthesis, lumbar region: Secondary | ICD-10-CM | POA: Diagnosis not present

## 2017-12-05 DIAGNOSIS — Z79899 Other long term (current) drug therapy: Secondary | ICD-10-CM | POA: Diagnosis not present

## 2017-12-05 DIAGNOSIS — F329 Major depressive disorder, single episode, unspecified: Secondary | ICD-10-CM | POA: Diagnosis present

## 2017-12-05 DIAGNOSIS — N289 Disorder of kidney and ureter, unspecified: Secondary | ICD-10-CM | POA: Diagnosis present

## 2017-12-05 DIAGNOSIS — I482 Chronic atrial fibrillation: Secondary | ICD-10-CM | POA: Diagnosis not present

## 2017-12-05 DIAGNOSIS — Z981 Arthrodesis status: Secondary | ICD-10-CM | POA: Diagnosis not present

## 2017-12-05 DIAGNOSIS — Z7902 Long term (current) use of antithrombotics/antiplatelets: Secondary | ICD-10-CM | POA: Diagnosis not present

## 2017-12-05 DIAGNOSIS — M4716 Other spondylosis with myelopathy, lumbar region: Secondary | ICD-10-CM | POA: Diagnosis not present

## 2017-12-05 DIAGNOSIS — J189 Pneumonia, unspecified organism: Secondary | ICD-10-CM | POA: Diagnosis not present

## 2017-12-05 DIAGNOSIS — M4326 Fusion of spine, lumbar region: Secondary | ICD-10-CM | POA: Diagnosis not present

## 2017-12-05 DIAGNOSIS — R32 Unspecified urinary incontinence: Secondary | ICD-10-CM | POA: Diagnosis present

## 2017-12-05 DIAGNOSIS — Z809 Family history of malignant neoplasm, unspecified: Secondary | ICD-10-CM | POA: Diagnosis not present

## 2017-12-05 DIAGNOSIS — I4891 Unspecified atrial fibrillation: Secondary | ICD-10-CM | POA: Diagnosis not present

## 2017-12-05 DIAGNOSIS — J181 Lobar pneumonia, unspecified organism: Secondary | ICD-10-CM | POA: Diagnosis not present

## 2017-12-05 DIAGNOSIS — R05 Cough: Secondary | ICD-10-CM | POA: Diagnosis not present

## 2017-12-05 DIAGNOSIS — E7800 Pure hypercholesterolemia, unspecified: Secondary | ICD-10-CM | POA: Diagnosis present

## 2017-12-05 DIAGNOSIS — M48062 Spinal stenosis, lumbar region with neurogenic claudication: Secondary | ICD-10-CM | POA: Diagnosis present

## 2017-12-07 LAB — CUP PACEART REMOTE DEVICE CHECK
Date Time Interrogation Session: 20181204231111
Implantable Pulse Generator Implant Date: 20160816

## 2017-12-14 DIAGNOSIS — M48062 Spinal stenosis, lumbar region with neurogenic claudication: Secondary | ICD-10-CM | POA: Diagnosis not present

## 2017-12-14 DIAGNOSIS — M48061 Spinal stenosis, lumbar region without neurogenic claudication: Secondary | ICD-10-CM | POA: Diagnosis not present

## 2017-12-14 DIAGNOSIS — Z981 Arthrodesis status: Secondary | ICD-10-CM | POA: Diagnosis not present

## 2017-12-15 DIAGNOSIS — M48062 Spinal stenosis, lumbar region with neurogenic claudication: Secondary | ICD-10-CM | POA: Insufficient documentation

## 2017-12-27 ENCOUNTER — Ambulatory Visit (INDEPENDENT_AMBULATORY_CARE_PROVIDER_SITE_OTHER): Payer: Medicare Other | Admitting: *Deleted

## 2017-12-27 DIAGNOSIS — I639 Cerebral infarction, unspecified: Secondary | ICD-10-CM

## 2017-12-28 NOTE — Progress Notes (Signed)
Carelink Summary Report / Loop Recorder 

## 2018-01-09 LAB — CUP PACEART REMOTE DEVICE CHECK
Date Time Interrogation Session: 20190103231044
Implantable Pulse Generator Implant Date: 20160816

## 2018-01-17 DIAGNOSIS — M47817 Spondylosis without myelopathy or radiculopathy, lumbosacral region: Secondary | ICD-10-CM | POA: Diagnosis not present

## 2018-01-17 DIAGNOSIS — M47816 Spondylosis without myelopathy or radiculopathy, lumbar region: Secondary | ICD-10-CM | POA: Diagnosis not present

## 2018-01-17 DIAGNOSIS — Z981 Arthrodesis status: Secondary | ICD-10-CM | POA: Diagnosis not present

## 2018-01-28 ENCOUNTER — Ambulatory Visit (INDEPENDENT_AMBULATORY_CARE_PROVIDER_SITE_OTHER): Payer: Medicare Other | Admitting: *Deleted

## 2018-01-28 DIAGNOSIS — I639 Cerebral infarction, unspecified: Secondary | ICD-10-CM | POA: Diagnosis not present

## 2018-01-28 NOTE — Progress Notes (Signed)
Carelink Summary Reprot / Loop Recorder 

## 2018-01-30 DIAGNOSIS — C679 Malignant neoplasm of bladder, unspecified: Secondary | ICD-10-CM | POA: Diagnosis not present

## 2018-01-30 DIAGNOSIS — Z79899 Other long term (current) drug therapy: Secondary | ICD-10-CM | POA: Diagnosis not present

## 2018-01-30 DIAGNOSIS — N183 Chronic kidney disease, stage 3 (moderate): Secondary | ICD-10-CM | POA: Diagnosis not present

## 2018-01-30 DIAGNOSIS — I480 Paroxysmal atrial fibrillation: Secondary | ICD-10-CM | POA: Diagnosis not present

## 2018-01-30 DIAGNOSIS — Z125 Encounter for screening for malignant neoplasm of prostate: Secondary | ICD-10-CM | POA: Diagnosis not present

## 2018-01-30 DIAGNOSIS — J449 Chronic obstructive pulmonary disease, unspecified: Secondary | ICD-10-CM | POA: Diagnosis not present

## 2018-02-07 DIAGNOSIS — I480 Paroxysmal atrial fibrillation: Secondary | ICD-10-CM | POA: Diagnosis not present

## 2018-02-07 DIAGNOSIS — Z6829 Body mass index (BMI) 29.0-29.9, adult: Secondary | ICD-10-CM | POA: Diagnosis not present

## 2018-02-07 DIAGNOSIS — I712 Thoracic aortic aneurysm, without rupture: Secondary | ICD-10-CM | POA: Diagnosis not present

## 2018-02-07 DIAGNOSIS — Z8551 Personal history of malignant neoplasm of bladder: Secondary | ICD-10-CM | POA: Diagnosis not present

## 2018-02-07 DIAGNOSIS — F3340 Major depressive disorder, recurrent, in remission, unspecified: Secondary | ICD-10-CM | POA: Diagnosis not present

## 2018-02-19 LAB — CUP PACEART REMOTE DEVICE CHECK
Date Time Interrogation Session: 20190202234143
Implantable Pulse Generator Implant Date: 20160816

## 2018-02-28 ENCOUNTER — Ambulatory Visit (INDEPENDENT_AMBULATORY_CARE_PROVIDER_SITE_OTHER): Payer: Medicare Other | Admitting: *Deleted

## 2018-02-28 DIAGNOSIS — I639 Cerebral infarction, unspecified: Secondary | ICD-10-CM

## 2018-03-01 NOTE — Progress Notes (Signed)
Carelink Summary Report / Loop Recorder 

## 2018-04-02 ENCOUNTER — Ambulatory Visit (INDEPENDENT_AMBULATORY_CARE_PROVIDER_SITE_OTHER): Payer: Medicare Other | Admitting: *Deleted

## 2018-04-02 DIAGNOSIS — I639 Cerebral infarction, unspecified: Secondary | ICD-10-CM

## 2018-04-03 NOTE — Progress Notes (Signed)
Carelink Summary Report / Loop Recorder 

## 2018-04-04 DIAGNOSIS — M216X1 Other acquired deformities of right foot: Secondary | ICD-10-CM | POA: Diagnosis not present

## 2018-04-04 DIAGNOSIS — M48062 Spinal stenosis, lumbar region with neurogenic claudication: Secondary | ICD-10-CM | POA: Diagnosis not present

## 2018-04-11 LAB — CUP PACEART REMOTE DEVICE CHECK
Date Time Interrogation Session: 20190308012752
Implantable Pulse Generator Implant Date: 20160816

## 2018-04-20 ENCOUNTER — Emergency Department (HOSPITAL_COMMUNITY)
Admission: EM | Admit: 2018-04-20 | Discharge: 2018-04-20 | Disposition: A | Discharge status: Home / Self Care | Payer: Medicare Other | Attending: Emergency Medicine | Admitting: Emergency Medicine

## 2018-04-20 ENCOUNTER — Encounter (HOSPITAL_COMMUNITY): Payer: Self-pay | Admitting: Emergency Medicine

## 2018-04-20 ENCOUNTER — Other Ambulatory Visit: Payer: Self-pay

## 2018-04-20 ENCOUNTER — Emergency Department (HOSPITAL_COMMUNITY): Payer: Medicare Other

## 2018-04-20 DIAGNOSIS — Z87891 Personal history of nicotine dependence: Secondary | ICD-10-CM | POA: Diagnosis not present

## 2018-04-20 DIAGNOSIS — S0101XA Laceration without foreign body of scalp, initial encounter: Secondary | ICD-10-CM

## 2018-04-20 DIAGNOSIS — W01198A Fall on same level from slipping, tripping and stumbling with subsequent striking against other object, initial encounter: Secondary | ICD-10-CM | POA: Diagnosis not present

## 2018-04-20 DIAGNOSIS — Y92007 Garden or yard of unspecified non-institutional (private) residence as the place of occurrence of the external cause: Secondary | ICD-10-CM | POA: Diagnosis not present

## 2018-04-20 DIAGNOSIS — Z79899 Other long term (current) drug therapy: Secondary | ICD-10-CM | POA: Insufficient documentation

## 2018-04-20 DIAGNOSIS — Y998 Other external cause status: Secondary | ICD-10-CM | POA: Diagnosis not present

## 2018-04-20 DIAGNOSIS — Y93H2 Activity, gardening and landscaping: Secondary | ICD-10-CM | POA: Insufficient documentation

## 2018-04-20 DIAGNOSIS — Z8673 Personal history of transient ischemic attack (TIA), and cerebral infarction without residual deficits: Secondary | ICD-10-CM | POA: Insufficient documentation

## 2018-04-20 DIAGNOSIS — I480 Paroxysmal atrial fibrillation: Secondary | ICD-10-CM | POA: Insufficient documentation

## 2018-04-20 DIAGNOSIS — E785 Hyperlipidemia, unspecified: Secondary | ICD-10-CM | POA: Insufficient documentation

## 2018-04-20 DIAGNOSIS — J449 Chronic obstructive pulmonary disease, unspecified: Secondary | ICD-10-CM | POA: Diagnosis not present

## 2018-04-20 MED ORDER — LIDOCAINE-EPINEPHRINE (PF) 2 %-1:200000 IJ SOLN
10.0000 mL | Freq: Once | INTRAMUSCULAR | Status: AC
  Administered 2018-04-20: 10 mL
  Filled 2018-04-20: qty 20

## 2018-04-20 NOTE — ED Provider Notes (Signed)
[facility] EMERGENCY DEPARTMENT Provider Note   CSN: [id number] Arrival date & time: 04/20/18  1304     History   Chief Complaint Chief Complaint  Patient presents with  . Head Laceration    HPI Kyle Owen is a 77 y.o. male.  HPI Patient presents to the emergency room for evaluation of a head laceration.  Patient was doing some yard work outside when he tripped and fell striking the left side of his head against a fence post.  Patient sustained a laceration.  Patient denies any loss of consciousness.  He denies any vomiting.  No neck pain.  No numbness or weakness.  Patient does take Eliquis and knew he needed to come to the emergency room for evaluation. Past Medical History:  Diagnosis Date  . Arthritis    DJD right knee  . Benign prostatic hypertrophy with urinary frequency   . Bladder cancer ([place])   . COPD with emphysema ([place])   . Depression   . Dyspnea    when walking  . Dysrhythmia    a fib  . History of colon polyps   . History of pneumonia    hx Recurrent -- last bout Dec 2016 CAP-- per pt resolved  . 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 Hyperlipidemia   . 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 Nephrolithiasis    right non-obstructive  per CT 05-02-2016  . Ocular migraine    controlled w/ verapamil  . PAF (paroxysmal atrial fibrillation) ([place])    followed by AFIB clinic-- cardiologist-- dr Marlou Porch  . Pneumonia    history of pnu.  . Pulmonary nodule, left    left lower lobe x2 per CT 01-20-2016  . Renal cyst, left   . 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 Wears hearing aid    bilateral-- wear at times     Patient Active Problem List   Diagnosis Date Noted  . Cervical myelopathy ([place]) 04/03/2017  . Thoracic ascending aortic aneurysm ([place]) 05/09/2016  . Thoracic aortic aneurysm without rupture ([place]) 01/17/2016  . Paroxysmal atrial fibrillation ([place]) 12/17/2015  . S/P partial thyroidectomy 01/20/2015    Past Surgical History:  Procedure Laterality Date  . ANTERIOR CERVICAL DECOMP/DISCECTOMY FUSION N/A 04/03/2017   Procedure: Cervical three-four, Cervical four-five Anterior cervical decompression/discectomy/fusion;  Surgeon: Erline Levine, MD;  Location: [facility];  Service: Neurosurgery;  Laterality: N/A;  . CATARACT EXTRACTION W/ INTRAOCULAR LENS IMPLANT Right 2016  . CATARACT EXTRACTION W/PHACO  12/16/2012   Procedure: CATARACT EXTRACTION PHACO AND INTRAOCULAR LENS PLACEMENT (IOC);  Surgeon: Tonny Branch, MD;  [facility];  Service: Ophthalmology;  Laterality: Left;  CDE:17.31  . COLONOSCOPY  10/03/2011   Procedure: COLONOSCOPY;  Surgeon: Jamesetta So;  [facility];  Service: Gastroenterology;  Laterality: N/A;  . DECOMPRESSIOIN ULNAR NERVE AND CUBITAL TUNNEL RELEASE Left 07-14-2009   elbow  . 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 INGUINAL HERNIA REPAIR Right 1990's  . KNEE ARTHROSCOPY Right 2015  . LEFT CUBITAL TUNNEL RELEASE    . POSTERIOR LUMBAR FUSION  2014  . SHOULDER ARTHROSCOPY Left 1990's  .  TEE WITHOUT CARDIOVERSION N/A 07/27/2015   Procedure: TRANSESOPHAGEAL ECHOCARDIOGRAM (TEE);  Surgeon: Lelon Perla, MD;  Location: [facility] ENDOSCOPY;  Service: Cardiovascular;  Laterality: N/A;   normal LV function, ef 55-60%,  mild AR and MR, mild dilated ascending aorta (4.2cm),  mild to moderate atherosclerosis  descending aorta,  mild to moderate TR,  negative saline microcavitation study  . THYROIDECTOMY Right 01/20/2015   Procedure: RIGHT THYROIDECTOMY;  Surgeon: Ascencion Dike, MD;  Location: [facility];  Service: ENT;   Laterality: Right;  . TONSILLECTOMY  as child  . TRANSURETHRAL RESECTION OF BLADDER TUMOR N/A 05/15/2016   Procedure: TRANSURETHRAL RESECTION OF BLADDER TUMOR (TURBT) AND INSTILLATION OF EPIRUBICIN;  Surgeon: Franchot Gallo, MD;  Location[facility];  Service: Urology;  Laterality: N/A;        Home Medications    Prior to Admission medications   Medication Sig Start Date End Date Taking? Authorizing Provider  acetaminophen (TYLENOL) 500 MG tablet Take 500 mg by mouth daily.    Yes [provider]  atorvastatin (LIPITOR) 20 MG tablet TAKE 1 TABLET BY MOUTH DAILY. 07/03/17  Yes Jerline Pain, MD  budesonide-formoterol (SYMBICORT) 160-4.5 MCG/ACT inhaler Inhale 2 puffs into the lungs 2 (two) times daily.   Yes [provider]  ELIQUIS 5 MG TABS tablet TAKE (1) TABLET BY MOUTH TWICE DAILY. 08/07/17  Yes Jerline Pain, MD  finasteride (PROSCAR) 5 MG tablet Take 5 mg by mouth every morning.    Yes [provider]  Magnesium 400 MG CAPS Take 400 mg by mouth every morning.    Yes [provider]  Multiple Vitamins-Minerals (CENTRUM SILVER PO) Take 1 tablet by mouth every morning.    Yes [provider]  sertraline (ZOLOFT) 50 MG tablet Take 50 mg by mouth every morning.    Yes [provider]  VENTOLIN HFA 108 (90 BASE) MCG/ACT inhaler Inhale 2 puffs into the lungs Every 4 hours as needed for wheezing or shortness of breath. Reported on 12/23/2015 09/24/12  Yes [provider]  verapamil (CALAN-SR) 240 MG CR tablet Take 240 mg by mouth every morning.    Yes [provider]    Family History Family History  Problem Relation Age of Onset  . Stroke Sister   . Breast cancer Sister   . Stroke Brother     Social History Social History   Tobacco Use  . Smoking status: Former Smoker    Packs/day: 1.00    Years: 45.00    Pack years: 45.00    Types: Cigarettes    Last attempt to quit: 09/26/2010    Years  since quitting: 7.5  . Smokeless tobacco: Never Used  Substance Use Topics  . Alcohol use: No  . Drug use: No     Allergies   Flomax [tamsulosin hcl] and Tamsulosin   Review of Systems Review of Systems  All other systems reviewed and are negative.    Physical Exam Updated Vital Signs BP 140/75 (BP Location: Right Arm)   Pulse 76   Temp 97.7 F (36.5 C) (Oral)   Resp 18   Ht 1.854 m (6\' 1" )   Wt 98 kg (216 lb)   SpO2 95%   BMI 28.50 kg/m   Physical Exam  Constitutional: He appears well-developed and well-nourished. No distress.  HENT:  Head: Normocephalic.  Right Ear: External ear normal.  Left Ear: External ear normal.  4 cm linear laceration left parietal region  Eyes: Conjunctivae  are normal. Right eye exhibits no discharge. Left eye exhibits no discharge. No scleral icterus.  Neck: Neck supple. No tracheal deviation present.  Cardiovascular: Normal rate and regular rhythm.  Pulmonary/Chest: Effort normal and breath sounds normal. No stridor. No respiratory distress.  Abdominal: He exhibits no distension.  Musculoskeletal: He exhibits no edema.  Neurological: He is alert. Cranial nerve deficit: no gross deficits.  Skin: Skin is warm and dry. No rash noted.  Psychiatric: He has a normal mood and affect.  Nursing note and vitals reviewed.    ED Treatments / Results   Radiology Ct Head Wo Contrast  Result Date: 04/20/2018 CLINICAL DATA:  Patient status post fall hitting the left side of the head. Left scalp laceration. EXAM: CT HEAD WITHOUT CONTRAST TECHNIQUE: Contiguous axial images were obtained from the base of the skull through the vertex without intravenous contrast. COMPARISON:  None. FINDINGS: Brain: Ventricles and sulci are appropriate for patient's age. No evidence for acute cortically based infarct, intracranial hemorrhage, mass lesion or mass-effect. Vascular: Internal carotid arterial vascular calcifications. Skull: No acute fracture.  Sinuses/Orbits: Paranasal sinuses are well aerated. Mastoid air cells are unremarkable. Orbits are unremarkable. Other: None. IMPRESSION: No acute intracranial process. Electronically Signed   By: Lovey Newcomer M.D.   On: 04/20/2018 14:32    Procedures .Marland KitchenLaceration Repair Date/Time: 04/20/2018 3:12 PM Performed by: Dorie Rank, MD Authorized by: Dorie Rank, MD   Consent:    Consent obtained:  Verbal   Consent given by:  Patient   Risks discussed:  Infection, need for additional repair, pain, poor cosmetic result and poor wound healing   Alternatives discussed:  No treatment and delayed treatment Universal protocol:    Procedure explained and questions answered to patient or proxy's satisfaction: yes     Relevant documents present and verified: yes     Test results available and properly labeled: yes     Imaging studies available: yes     Required blood products, implants, devices, and special equipment available: yes     Site/side marked: yes     Immediately prior to procedure, a time out was called: yes     Patient identity confirmed:  Verbally with patient Anesthesia (see MAR for exact dosages):    Anesthesia method:  Local infiltration Laceration details:    Location:  Scalp   Scalp location:  L parietal   Length (cm):  4 Repair type:    Repair type:  Simple Pre-procedure details:    Preparation:  Patient was prepped and draped in usual sterile fashion Exploration:    Hemostasis achieved with:  Direct pressure   Wound extent: no areolar tissue violation noted, no fascia violation noted, no foreign bodies/material noted, no muscle damage noted, no nerve damage noted, no tendon damage noted, no underlying fracture noted and no vascular damage noted     Contaminated: no   Treatment:    Area cleansed with:  Shur-Clens   Amount of cleaning:  Standard   Visualized foreign bodies/material removed: no   Skin repair:    Repair method:  Staples   Number of staples:  7 Approximation:     Approximation:  Close Post-procedure details:    Dressing:  Open (no dressing)   Patient tolerance of procedure:  Tolerated well, no immediate complications   (including critical care time)  Medications Ordered in ED Medications  lidocaine-EPINEPHrine (XYLOCAINE W/EPI) 2 %-1:200000 (PF) injection 10 mL (has no administration in time range)     Initial Impression / Assessment  and Plan / ED Course  I have reviewed the triage vital signs and the nursing notes.  Pertinent labs & imaging results that were available during my care of the patient were reviewed by me and considered in my medical decision making (see chart for details).    No evidence of serious injury associated with the fall. CT scan negative    Final Clinical Impressions(s) / ED Diagnoses   Final diagnoses:  Laceration of scalp, initial encounter    ED Discharge Orders    None       Dorie Rank, MD 04/20/18 1514

## 2018-04-20 NOTE — ED Notes (Signed)
Scalp wound irrigated/cleaned with sur-clens.

## 2018-04-20 NOTE — ED Notes (Signed)
ED Provider at bedside. 

## 2018-04-20 NOTE — Discharge Instructions (Addendum)
Staple removal in 7 days, please review the head injury precautions

## 2018-04-20 NOTE — ED Triage Notes (Signed)
PT states he was outdoors and tripped in the yard and fell and hit the left side of his head on a fence post. Laceration noted to left side of his head. PT states he is on Eliquis.

## 2018-04-30 DIAGNOSIS — Z4802 Encounter for removal of sutures: Secondary | ICD-10-CM | POA: Diagnosis not present

## 2018-05-06 ENCOUNTER — Ambulatory Visit (INDEPENDENT_AMBULATORY_CARE_PROVIDER_SITE_OTHER): Payer: Medicare Other | Admitting: *Deleted

## 2018-05-06 DIAGNOSIS — I639 Cerebral infarction, unspecified: Secondary | ICD-10-CM | POA: Diagnosis not present

## 2018-05-06 LAB — CUP PACEART REMOTE DEVICE CHECK
Date Time Interrogation Session: 20190410013940
Implantable Pulse Generator Implant Date: 20160816

## 2018-05-07 NOTE — Progress Notes (Signed)
Carelink Summary Report / Loop Recorder 

## 2018-05-09 ENCOUNTER — Other Ambulatory Visit: Payer: Self-pay | Admitting: Cardiology

## 2018-05-29 LAB — CUP PACEART REMOTE DEVICE CHECK
Date Time Interrogation Session: 20190513021039
Implantable Pulse Generator Implant Date: 20160816

## 2018-06-06 DIAGNOSIS — I482 Chronic atrial fibrillation: Secondary | ICD-10-CM | POA: Diagnosis not present

## 2018-06-06 DIAGNOSIS — N183 Chronic kidney disease, stage 3 (moderate): Secondary | ICD-10-CM | POA: Diagnosis not present

## 2018-06-06 DIAGNOSIS — E785 Hyperlipidemia, unspecified: Secondary | ICD-10-CM | POA: Diagnosis not present

## 2018-06-06 DIAGNOSIS — M1990 Unspecified osteoarthritis, unspecified site: Secondary | ICD-10-CM | POA: Diagnosis not present

## 2018-06-06 DIAGNOSIS — Z79899 Other long term (current) drug therapy: Secondary | ICD-10-CM | POA: Diagnosis not present

## 2018-06-07 ENCOUNTER — Ambulatory Visit (INDEPENDENT_AMBULATORY_CARE_PROVIDER_SITE_OTHER): Payer: Medicare Other | Admitting: *Deleted

## 2018-06-07 DIAGNOSIS — I639 Cerebral infarction, unspecified: Secondary | ICD-10-CM

## 2018-06-10 NOTE — Progress Notes (Signed)
Carelink Summary Report / Loop Recorder 

## 2018-06-13 DIAGNOSIS — N183 Chronic kidney disease, stage 3 (moderate): Secondary | ICD-10-CM | POA: Diagnosis not present

## 2018-06-13 DIAGNOSIS — I480 Paroxysmal atrial fibrillation: Secondary | ICD-10-CM | POA: Diagnosis not present

## 2018-06-13 DIAGNOSIS — M5030 Other cervical disc degeneration, unspecified cervical region: Secondary | ICD-10-CM | POA: Diagnosis not present

## 2018-07-05 ENCOUNTER — Other Ambulatory Visit: Payer: Self-pay | Admitting: *Deleted

## 2018-07-05 DIAGNOSIS — I712 Thoracic aortic aneurysm, without rupture, unspecified: Secondary | ICD-10-CM

## 2018-07-05 NOTE — Progress Notes (Unsigned)
c 

## 2018-07-10 ENCOUNTER — Ambulatory Visit (INDEPENDENT_AMBULATORY_CARE_PROVIDER_SITE_OTHER): Payer: Medicare Other | Admitting: *Deleted

## 2018-07-10 DIAGNOSIS — M4316 Spondylolisthesis, lumbar region: Secondary | ICD-10-CM | POA: Diagnosis not present

## 2018-07-10 DIAGNOSIS — I639 Cerebral infarction, unspecified: Secondary | ICD-10-CM | POA: Diagnosis not present

## 2018-07-10 DIAGNOSIS — M48062 Spinal stenosis, lumbar region with neurogenic claudication: Secondary | ICD-10-CM | POA: Diagnosis not present

## 2018-07-11 NOTE — Progress Notes (Signed)
Carelink Summary Report / Loop Recorder 

## 2018-07-13 LAB — CUP PACEART REMOTE DEVICE CHECK
Date Time Interrogation Session: 20190615023528
Implantable Pulse Generator Implant Date: 20160816

## 2018-07-23 DIAGNOSIS — M1711 Unilateral primary osteoarthritis, right knee: Secondary | ICD-10-CM | POA: Diagnosis not present

## 2018-08-01 DIAGNOSIS — T6591XA Toxic effect of unspecified substance, accidental (unintentional), initial encounter: Secondary | ICD-10-CM | POA: Diagnosis not present

## 2018-08-01 DIAGNOSIS — I4891 Unspecified atrial fibrillation: Secondary | ICD-10-CM | POA: Diagnosis not present

## 2018-08-12 ENCOUNTER — Ambulatory Visit (INDEPENDENT_AMBULATORY_CARE_PROVIDER_SITE_OTHER): Payer: Medicare Other | Admitting: *Deleted

## 2018-08-12 DIAGNOSIS — I639 Cerebral infarction, unspecified: Secondary | ICD-10-CM | POA: Diagnosis not present

## 2018-08-13 NOTE — Progress Notes (Signed)
Carelink Summary Report / Loop Recorder 

## 2018-08-20 ENCOUNTER — Ambulatory Visit (INDEPENDENT_AMBULATORY_CARE_PROVIDER_SITE_OTHER): Payer: Medicare Other | Admitting: Urology

## 2018-08-20 DIAGNOSIS — C670 Malignant neoplasm of trigone of bladder: Secondary | ICD-10-CM

## 2018-08-27 LAB — CUP PACEART REMOTE DEVICE CHECK
Date Time Interrogation Session: 20190718023542
Implantable Pulse Generator Implant Date: 20160816

## 2018-08-28 ENCOUNTER — Ambulatory Visit (INDEPENDENT_AMBULATORY_CARE_PROVIDER_SITE_OTHER): Payer: Medicare Other | Admitting: Cardiothoracic Surgery

## 2018-08-28 ENCOUNTER — Ambulatory Visit
Admission: RE | Admit: 2018-08-28 | Discharge: 2018-08-28 | Disposition: A | Discharge status: Home / Self Care | Payer: Medicare Other | Source: Ambulatory Visit | Attending: Cardiothoracic Surgery | Admitting: Cardiothoracic Surgery

## 2018-08-28 ENCOUNTER — Encounter: Payer: Self-pay | Admitting: Cardiothoracic Surgery

## 2018-08-28 VITALS — BP 110/68 | HR 64 | Resp 20 | Ht 73.0 in | Wt 215.0 lb

## 2018-08-28 DIAGNOSIS — I712 Thoracic aortic aneurysm, without rupture, unspecified: Secondary | ICD-10-CM

## 2018-08-28 DIAGNOSIS — I639 Cerebral infarction, unspecified: Secondary | ICD-10-CM | POA: Diagnosis not present

## 2018-08-28 MED ORDER — IOPAMIDOL (ISOVUE-300) INJECTION 61%
75.0000 mL | Freq: Once | INTRAVENOUS | Status: AC | PRN
  Administered 2018-08-28: 75 mL via INTRAVENOUS

## 2018-08-28 NOTE — Progress Notes (Signed)
PCP is Asencion Noble, MD Referring Provider is Asencion Noble, MD  Chief Complaint  Patient presents with  . 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  First noted in 2017.  There is been no change in diameter.  Patient has hypertension which is well controlled with verapamil.  He is a non-smoker.  He takes Lipitor.  He takes Eliquis for paroxysmal atrial fibrillation.  CTA chest images personally reviewed.  They show no change in the fusiform ascending aneurysm.  No mural thickening thrombus or ulceration.   Past Medical History:  Diagnosis Date  . Arthritis    DJD right knee  . Benign prostatic hypertrophy with urinary frequency   . Bladder cancer ([place])   . COPD with emphysema ([place])   . Depression   . Dyspnea    when walking  . Dysrhythmia    a fib  . History of colon polyps   . History of pneumonia    hx Recurrent -- last bout Dec 2016 CAP-- per pt resolved  . 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 Hyperlipidemia   . 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 Nephrolithiasis    right non-obstructive  per CT 05-02-2016  . Ocular migraine    controlled w/ verapamil  . PAF (paroxysmal atrial fibrillation) ([place])    followed by AFIB clinic-- cardiologist-- dr Marlou Porch  . Pneumonia    history of pnu.  . Pulmonary nodule, left    left lower lobe x2 per CT 01-20-2016  . Renal cyst, left   . Thoracic aortic aneurysm without rupture ([place])    ascending -- ct chest 01/10/16  4.8cm  . Wears hearing aid    bilateral-- wear at times    Past Surgical History:  Procedure Laterality Date  . ANTERIOR  CERVICAL DECOMP/DISCECTOMY FUSION N/A 04/03/2017   Procedure: Cervical three-four, Cervical four-five Anterior cervical decompression/discectomy/fusion;  Surgeon: Erline Levine, MD;  Location: [place];  Service: Neurosurgery;  Laterality: N/A;  . CATARACT EXTRACTION W/ INTRAOCULAR LENS IMPLANT Right 2016  . CATARACT EXTRACTION W/PHACO  12/16/2012   Procedure: CATARACT EXTRACTION PHACO AND INTRAOCULAR LENS PLACEMENT (IOC);  Surgeon: Tonny Branch, MD;  [facility];  Service: Ophthalmology;  Laterality: Left;  CDE:17.31  . COLONOSCOPY  10/03/2011   Procedure: COLONOSCOPY;  Surgeon: Jamesetta So;  [facility];  Service: Gastroenterology;  Laterality: N/A;  . DECOMPRESSIOIN ULNAR NERVE AND CUBITAL TUNNEL RELEASE Left 07-14-2009   elbow  . 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 INGUINAL HERNIA REPAIR Right 1990's  . KNEE ARTHROSCOPY Right 2015  . LEFT CUBITAL TUNNEL RELEASE    . POSTERIOR LUMBAR FUSION  2014  . SHOULDER ARTHROSCOPY Left 1990's  . TEE WITHOUT CARDIOVERSION N/A 07/27/2015   Procedure: TRANSESOPHAGEAL ECHOCARDIOGRAM (TEE);  Surgeon: Lelon Perla, MD;  Location: [facility];  Service: Cardiovascular;  Laterality: N/A;   normal LV function, ef 55-60%,  mild AR and MR, mild dilated ascending aorta (4.2cm),  mild to moderate atherosclerosis  descending aorta,  mild to moderate TR,  negative saline  microcavitation study  . THYROIDECTOMY Right 01/20/2015   Procedure: RIGHT THYROIDECTOMY;  Surgeon: Ascencion Dike, MD;  Location: [facility];  Service: ENT;  Laterality: Right;  . TONSILLECTOMY  as child  . TRANSURETHRAL RESECTION OF BLADDER TUMOR N/A 05/15/2016   Procedure: TRANSURETHRAL RESECTION OF BLADDER TUMOR (TURBT) AND INSTILLATION OF EPIRUBICIN;  Surgeon: Franchot Gallo, MD;  Location: [facility];  Service: Urology;  Laterality: N/A;    Family History  Problem Relation Age of Onset  . Stroke Sister   .  Breast cancer Sister   . Stroke Brother     Social History Social History   Tobacco Use  . Smoking status: Former Smoker    Packs/day: 1.00    Years: 45.00    Pack years: 45.00    Types: Cigarettes    Last attempt to quit: 09/26/2010    Years since quitting: 7.9  . Smokeless tobacco: Never Used  Substance Use Topics  . Alcohol use: No  . Drug use: No    Current Outpatient Medications  Medication Sig Dispense Refill  . acetaminophen (TYLENOL) 500 MG tablet Take 500 mg by mouth daily.     Marland Kitchen atorvastatin (LIPITOR) 20 MG tablet TAKE 1 TABLET BY MOUTH DAILY. 90 tablet 2  . budesonide-formoterol (SYMBICORT) 160-4.5 MCG/ACT inhaler Inhale 2 puffs into the lungs 2 (two) times daily.    Marland Kitchen ELIQUIS 5 MG TABS tablet TAKE (1) TABLET BY MOUTH TWICE DAILY. 60 tablet 11  . finasteride (PROSCAR) 5 MG tablet Take 5 mg by mouth every morning.     . Magnesium 400 MG CAPS Take 400 mg by mouth every morning.     . sertraline (ZOLOFT) 50 MG tablet Take 50 mg by mouth every morning.     . VENTOLIN HFA 108 (90 BASE) MCG/ACT inhaler Inhale 2 puffs into the lungs Every 4 hours as needed for wheezing or shortness of breath. Reported on 12/23/2015    . verapamil (CALAN-SR) 240 MG CR tablet Take 240 mg by mouth every morning.      No current facility-administered medications for this visit.     Allergies  Allergen Reactions  . Flomax [Tamsulosin Hcl] Nausea And Vomiting and Other (See Comments)    "Pt thought he was dying " DIZZY  . Tamsulosin Nausea Only    Review of Systems  Weight stable Having some difficulty with venous insufficiency and leg swelling Has had recent back surgery by Dr. Carloyn Manner, lumbar fusion.  No cardiac difficulties perioperatively. No cough or hemoptysis. No syncope or falls. Still works as a Chief Financial Officer Patient takes Eliquis for paroxysmal atrial fibrillation.  BP 110/68   Pulse 64   Resp 20   Ht 6\' 1"  (1.854 m)   Wt 215 lb (97.5 kg)   SpO2 95% Comment: RA  BMI 28.37  kg/m  Physical Exam      Exam    General- alert and comfortable    Neck- no JVD, no cervical adenopathy palpable, no carotid bruit   Lungs- clear without rales, wheezes   Cor- regular rate and rhythm, no murmur , gallop   Abdomen- soft, non-tender   Extremities - warm, non-tender, minimal edema   Neuro- oriented, appropriate, no focal weakness   Diagnostic Tests: The  CT scan images personally reviewed showing a stable 4.7 cm fusiform aneurysm.  No change since 2017.  Impression: Stable asymptomatic moderate fusiform aneurysm. Risk of aortic dissection is less than 2%. Best therapy is blood pressure control and annual surveillance  scanning.  Plan: Return with CTA of aorta in 1 year.  Len Childs, MD [facility] [phone]

## 2018-09-03 DIAGNOSIS — M7062 Trochanteric bursitis, left hip: Secondary | ICD-10-CM | POA: Diagnosis not present

## 2018-09-03 DIAGNOSIS — M1711 Unilateral primary osteoarthritis, right knee: Secondary | ICD-10-CM | POA: Diagnosis not present

## 2018-09-16 ENCOUNTER — Ambulatory Visit (INDEPENDENT_AMBULATORY_CARE_PROVIDER_SITE_OTHER): Payer: Medicare Other | Admitting: *Deleted

## 2018-09-16 DIAGNOSIS — I639 Cerebral infarction, unspecified: Secondary | ICD-10-CM

## 2018-09-16 LAB — CUP PACEART REMOTE DEVICE CHECK
Date Time Interrogation Session: 20190820073932
Implantable Pulse Generator Implant Date: 20160816

## 2018-09-16 NOTE — Progress Notes (Signed)
Carelink Summary Report / Loop Recorder 

## 2018-09-23 LAB — CUP PACEART REMOTE DEVICE CHECK
Date Time Interrogation Session: 20190922094141
Implantable Pulse Generator Implant Date: 20160816

## 2018-09-26 DIAGNOSIS — Z79899 Other long term (current) drug therapy: Secondary | ICD-10-CM | POA: Diagnosis not present

## 2018-09-26 DIAGNOSIS — N183 Chronic kidney disease, stage 3 (moderate): Secondary | ICD-10-CM | POA: Diagnosis not present

## 2018-10-03 DIAGNOSIS — L820 Inflamed seborrheic keratosis: Secondary | ICD-10-CM | POA: Diagnosis not present

## 2018-10-03 DIAGNOSIS — I480 Paroxysmal atrial fibrillation: Secondary | ICD-10-CM | POA: Diagnosis not present

## 2018-10-03 DIAGNOSIS — Z23 Encounter for immunization: Secondary | ICD-10-CM | POA: Diagnosis not present

## 2018-10-03 DIAGNOSIS — N183 Chronic kidney disease, stage 3 (moderate): Secondary | ICD-10-CM | POA: Diagnosis not present

## 2018-10-15 DIAGNOSIS — M48062 Spinal stenosis, lumbar region with neurogenic claudication: Secondary | ICD-10-CM | POA: Diagnosis not present

## 2018-10-15 DIAGNOSIS — G8929 Other chronic pain: Secondary | ICD-10-CM | POA: Diagnosis not present

## 2018-10-15 DIAGNOSIS — M25561 Pain in right knee: Secondary | ICD-10-CM | POA: Diagnosis not present

## 2018-10-15 DIAGNOSIS — M47816 Spondylosis without myelopathy or radiculopathy, lumbar region: Secondary | ICD-10-CM | POA: Diagnosis not present

## 2018-10-16 DIAGNOSIS — B078 Other viral warts: Secondary | ICD-10-CM | POA: Diagnosis not present

## 2018-10-16 DIAGNOSIS — L720 Epidermal cyst: Secondary | ICD-10-CM | POA: Diagnosis not present

## 2018-10-18 ENCOUNTER — Ambulatory Visit (INDEPENDENT_AMBULATORY_CARE_PROVIDER_SITE_OTHER): Payer: Medicare Other | Admitting: *Deleted

## 2018-10-18 DIAGNOSIS — I6389 Other cerebral infarction: Secondary | ICD-10-CM

## 2018-10-18 NOTE — Progress Notes (Signed)
Carelink Summary Report / Loop Recorder 

## 2018-10-23 ENCOUNTER — Ambulatory Visit (INDEPENDENT_AMBULATORY_CARE_PROVIDER_SITE_OTHER): Payer: Medicare Other | Admitting: Cardiology

## 2018-10-23 ENCOUNTER — Encounter: Payer: Self-pay | Admitting: Cardiology

## 2018-10-23 VITALS — BP 126/76 | HR 67 | Ht 73.0 in | Wt 218.4 lb

## 2018-10-23 DIAGNOSIS — I639 Cerebral infarction, unspecified: Secondary | ICD-10-CM | POA: Diagnosis not present

## 2018-10-23 DIAGNOSIS — I712 Thoracic aortic aneurysm, without rupture: Secondary | ICD-10-CM | POA: Diagnosis not present

## 2018-10-23 DIAGNOSIS — I7121 Aneurysm of the ascending aorta, without rupture: Secondary | ICD-10-CM

## 2018-10-23 DIAGNOSIS — I480 Paroxysmal atrial fibrillation: Secondary | ICD-10-CM

## 2018-10-23 DIAGNOSIS — E785 Hyperlipidemia, unspecified: Secondary | ICD-10-CM

## 2018-10-23 NOTE — Progress Notes (Signed)
Cardiology Office Note:    Date:  10/23/2018   ID:  Kyle Owen, DOB 03-01-1941, MRN [id number]  PCP:  Asencion Noble, MD  Cardiologist:  No primary care provider on file.  Electrophysiologist:  None   Referring MD: Asencion Noble, MD     History of Present Illness:    Kyle Owen is a 77 y.o. male here for follow-up of ascending aortic aneurysm, 4.7 cm noted in 2017.  CT images personally reviewed.  Also is currently on Eliquis for paroxysmal atrial fibrillation without any signs of bleeding.  No myalgias on atorvastatin.  Past Medical History:  Diagnosis Date  . Arthritis    DJD right knee  . Benign prostatic hypertrophy with urinary frequency   . Bladder cancer ([place])   . COPD with emphysema ([place])   . Depression   . Dyspnea    when walking  . Dysrhythmia    a fib  . History of colon polyps   . History of pneumonia    hx Recurrent -- last bout Dec 2016 CAP-- per pt resolved  . 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 Hyperlipidemia   . 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 Nephrolithiasis    right non-obstructive  per CT 05-02-2016  . Ocular migraine    controlled w/ verapamil  . PAF (paroxysmal atrial fibrillation) ([place])    followed by AFIB clinic-- cardiologist-- dr Marlou Porch  . Pneumonia    history of pnu.  . Pulmonary nodule, left    left lower lobe x2 per CT 01-20-2016  . Renal cyst, left   . Thoracic aortic aneurysm without rupture ([place])    ascending -- ct chest 01/10/16  4.8cm  . Wears hearing aid    bilateral-- wear at times    Past Surgical History:  Procedure Laterality Date  . ANTERIOR CERVICAL DECOMP/DISCECTOMY FUSION N/A 04/03/2017   Procedure: Cervical three-four,  Cervical four-five Anterior cervical decompression/discectomy/fusion;  Surgeon: Erline Levine, MD;  Location: [place];  Service: Neurosurgery;  Laterality: N/A;  . CATARACT EXTRACTION W/ INTRAOCULAR LENS IMPLANT Right 2016  . CATARACT EXTRACTION W/PHACO  12/16/2012   Procedure: CATARACT EXTRACTION PHACO AND INTRAOCULAR LENS PLACEMENT (IOC);  Surgeon: Tonny Branch, MD;  [facility];  Service: Ophthalmology;  Laterality: Left;  CDE:17.31  . COLONOSCOPY  10/03/2011   Procedure: COLONOSCOPY;  Surgeon: Jamesetta So;  [facility];  Service: Gastroenterology;  Laterality: N/A;  . DECOMPRESSIOIN ULNAR NERVE AND CUBITAL TUNNEL RELEASE Left 07-14-2009   elbow  . 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 INGUINAL HERNIA REPAIR Right 1990's  . KNEE ARTHROSCOPY Right 2015  . LEFT CUBITAL TUNNEL RELEASE    . POSTERIOR LUMBAR FUSION  2014  . SHOULDER ARTHROSCOPY Left 1990's  . TEE WITHOUT CARDIOVERSION N/A 07/27/2015   Procedure: TRANSESOPHAGEAL ECHOCARDIOGRAM (TEE);  Surgeon: Lelon Perla, MD;  Location: [facility];  Service: Cardiovascular;  Laterality: N/A;   normal LV function, ef 55-60%,  mild AR and MR, mild dilated ascending aorta (4.2cm),  mild to moderate atherosclerosis  descending aorta,  mild to moderate TR,  negative saline microcavitation study  . THYROIDECTOMY Right 01/20/2015   Procedure: RIGHT THYROIDECTOMY;  Surgeon: Ascencion Dike, MD;  Location: [place];  Service: ENT;  Laterality: Right;  . TONSILLECTOMY  as child  . TRANSURETHRAL RESECTION OF BLADDER TUMOR N/A 05/15/2016   Procedure: TRANSURETHRAL RESECTION OF BLADDER TUMOR (TURBT) AND INSTILLATION OF EPIRUBICIN;  Surgeon: Franchot Gallo, MD;  Location: [facility];  Service: Urology;  Laterality: N/A;    Current Medications: Current Meds  Medication Sig  . acetaminophen (TYLENOL) 500 MG tablet Take 500 mg by mouth daily.   Marland Kitchen atorvastatin (LIPITOR) 20  MG tablet TAKE 1 TABLET BY MOUTH DAILY.  . budesonide-formoterol (SYMBICORT) 160-4.5 MCG/ACT inhaler Inhale 2 puffs into the lungs 2 (two) times daily.  Marland Kitchen ELIQUIS 5 MG TABS tablet TAKE (1) TABLET BY MOUTH TWICE DAILY.  . finasteride (PROSCAR) 5 MG tablet Take 5 mg by mouth every morning.   . Magnesium 400 MG CAPS Take 400 mg by mouth every morning.   . sertraline (ZOLOFT) 50 MG tablet Take 50 mg by mouth every morning.   . VENTOLIN HFA 108 (90 BASE) MCG/ACT inhaler Inhale 2 puffs into the lungs Every 4 hours as needed for wheezing or shortness of breath. Reported on 12/23/2015  . verapamil (CALAN-SR) 240 MG CR tablet Take 240 mg by mouth every morning.      Allergies:   Flomax [tamsulosin hcl] and Tamsulosin   Social History   Socioeconomic History  . Marital status: Married    Spouse name: Not on file  . Number of children: 3  . Years of education: 40  . Highest education level: Not on file  Occupational History  . 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 Financial resource strain: Not on file  . Food insecurity:    Worry: Not on file    Inability: Not on file  . Transportation needs:    Medical: Not on file    Non-medical: Not on file  Tobacco Use  . Smoking status: Former Smoker    Packs/day: 1.00    Years: 45.00    Pack years: 45.00    Types: Cigarettes    Last attempt to quit: 09/26/2010    Years since quitting: 8.0  . Smokeless tobacco: Never Used  Substance and Sexual Activity  . Alcohol use: No  . Drug use: No  . Sexual activity: Not on file  Lifestyle  . Physical activity:    Days per week: Not on file    Minutes per session: Not on file  . Stress: Not on file  Relationships  . Social connections:    Talks on phone: Not on file    Gets together: Not on file    Attends religious service: Not on file    Active member of club or organization: Not on file    Attends meetings of clubs or organizations: Not on file    Relationship  status: Not on file  Other Topics Concern  . Not on file  Social History Narrative   Widowed, lives with dog, [place] in [place], wife passed from Parkinson's disease 3 1/2 yrs ago   1 cup coffee daily     Family History: The patient's family history includes Breast cancer in his sister; Stroke in his brother and sister.  ROS:   Please see the history of present illness.    Dizziness, no fevers chills nausea vomiting syncope all other systems reviewed and are negative.  EKGs/Labs/Other Studies Reviewed:    The following studies were reviewed today:  CT scan, EKG, lab work, prior office notes  EKG:  EKG is  ordered today.  The ekg ordered today demonstrates sinus rhythm with PACs otherwise normal.  Personally reviewed.  Heart rate 67 bpm, no change from prior.   Recent Labs: No results found for requested labs within last 8760 hours.  Recent Lipid Panel    Component Value Date/Time   CHOL 225 (H) 06/09/2015 1040   TRIG 100 06/09/2015 1040   HDL 68 06/09/2015 1040   CHOLHDL 3.3 06/09/2015 1040   LDLCALC 137 (H) 06/09/2015 1040    Physical Exam:    VS:  BP 126/76   Pulse 67   Ht 6\' 1"  (1.854 m)   Wt 218 lb 6.4 oz (99.1 kg)   SpO2 96%   BMI 28.81 kg/m     Wt Readings from Last 3 Encounters:  10/23/18 218 lb 6.4 oz (99.1 kg)  08/28/18 215 lb (97.5 kg)  04/20/18 216 lb (98 kg)     GEN:  Well nourished, well developed in no acute distress HEENT: Normal NECK: No JVD; No carotid bruits LYMPHATICS: No lymphadenopathy CARDIAC: RRR, no murmurs, rubs, gallops RESPIRATORY:  Clear to auscultation without rales, wheezing or rhonchi  ABDOMEN: Soft, non-tender, non-distended MUSCULOSKELETAL:  No edema; No deformity  SKIN: Warm and dry NEUROLOGIC:  Alert and oriented x 3 PSYCHIATRIC:  Normal affect   ASSESSMENT:    1. Thoracic ascending aortic aneurysm ([facility])   2. Paroxysmal atrial fibrillation ([place])   3. Hyperlipidemia, unspecified hyperlipidemia type    PLAN:    In  order of problems listed above:  Paroxysmal atrial fibrillation -About 3% burden on loop recorder.  Verapamil can be utilized for both migraine and ocular prophylaxis as well as atrial fibrillation control.  Has prior cryptogenic stroke. CHADSVASc score was 4.  On Eliquis.  History of cryptogenic stroke -Atrial fibrillation was discovered.  Ascending aortic thoracic aneurysm - 4.7 cm stable.  Avoid Cipro.  Discussed.  Dr. Darcey Nora  Hyperlipidemia mixed - Encourage statin use atorvastatin  Bladder cancer -Stable.  COPD -Former smoker 2011 quit  Balance issues/disequilibrium - Continue to encourage cane use.  Be careful.  He has fallen.  Eliquis.     Medication Adjustments/Labs and Tests Ordered: Current medicines are reviewed at length with the patient today.  Concerns regarding medicines are outlined above.  No orders of the defined types were placed in this encounter.  No orders of the defined types were placed in this encounter.   Patient Instructions  Your physician recommends that you continue on your current medications as directed. Please refer to the Current Medication list given to you today.   Your physician wants you to follow-up in: [place] will receive a reminder letter in the mail two months in advance. If you don't receive a letter, please call our office to schedule the follow-up appointment.     Signed, Candee Furbish, MD  10/23/2018 2:56 PM    [place]

## 2018-10-23 NOTE — Patient Instructions (Signed)
Your physician recommends that you continue on your current medications as directed. Please refer to the Current Medication list given to you today.  Your physician wants you to follow-up in: YEAR WITH DR SKAINS  You will receive a reminder letter in the mail two months in advance. If you don't receive a letter, please call our office to schedule the follow-up appointment.  

## 2018-11-01 LAB — CUP PACEART REMOTE DEVICE CHECK
Date Time Interrogation Session: 20191025104107
Implantable Pulse Generator Implant Date: 20160816

## 2018-11-20 ENCOUNTER — Ambulatory Visit (INDEPENDENT_AMBULATORY_CARE_PROVIDER_SITE_OTHER): Payer: Medicare Other

## 2018-11-20 DIAGNOSIS — I6389 Other cerebral infarction: Secondary | ICD-10-CM | POA: Diagnosis not present

## 2018-11-20 NOTE — Progress Notes (Signed)
Carelink Summary Report / Loop Recorder 

## 2018-12-23 ENCOUNTER — Ambulatory Visit (INDEPENDENT_AMBULATORY_CARE_PROVIDER_SITE_OTHER): Payer: Medicare Other

## 2018-12-23 DIAGNOSIS — I6389 Other cerebral infarction: Secondary | ICD-10-CM

## 2018-12-23 NOTE — Progress Notes (Signed)
Carelink Summary Report / Loop Recorder 

## 2018-12-24 LAB — CUP PACEART REMOTE DEVICE CHECK
Date Time Interrogation Session: 20191230110842
Implantable Pulse Generator Implant Date: 20160816

## 2019-01-05 LAB — CUP PACEART REMOTE DEVICE CHECK
Date Time Interrogation Session: 20191127110659
Implantable Pulse Generator Implant Date: 20160816

## 2019-01-07 DIAGNOSIS — M47816 Spondylosis without myelopathy or radiculopathy, lumbar region: Secondary | ICD-10-CM | POA: Diagnosis not present

## 2019-01-07 DIAGNOSIS — M48062 Spinal stenosis, lumbar region with neurogenic claudication: Secondary | ICD-10-CM | POA: Diagnosis not present

## 2019-01-27 ENCOUNTER — Ambulatory Visit (INDEPENDENT_AMBULATORY_CARE_PROVIDER_SITE_OTHER): Payer: Medicare Other

## 2019-01-27 DIAGNOSIS — I6389 Other cerebral infarction: Secondary | ICD-10-CM | POA: Diagnosis not present

## 2019-01-28 DIAGNOSIS — Z79899 Other long term (current) drug therapy: Secondary | ICD-10-CM | POA: Diagnosis not present

## 2019-01-28 DIAGNOSIS — I480 Paroxysmal atrial fibrillation: Secondary | ICD-10-CM | POA: Diagnosis not present

## 2019-01-28 DIAGNOSIS — J449 Chronic obstructive pulmonary disease, unspecified: Secondary | ICD-10-CM | POA: Diagnosis not present

## 2019-01-28 DIAGNOSIS — N183 Chronic kidney disease, stage 3 (moderate): Secondary | ICD-10-CM | POA: Diagnosis not present

## 2019-01-30 LAB — CUP PACEART REMOTE DEVICE CHECK
Date Time Interrogation Session: 20200201110540
Implantable Pulse Generator Implant Date: 20160816

## 2019-02-04 DIAGNOSIS — R443 Hallucinations, unspecified: Secondary | ICD-10-CM | POA: Diagnosis not present

## 2019-02-04 DIAGNOSIS — R51 Headache: Secondary | ICD-10-CM | POA: Diagnosis not present

## 2019-02-04 DIAGNOSIS — I480 Paroxysmal atrial fibrillation: Secondary | ICD-10-CM | POA: Diagnosis not present

## 2019-02-04 DIAGNOSIS — N183 Chronic kidney disease, stage 3 (moderate): Secondary | ICD-10-CM | POA: Diagnosis not present

## 2019-02-05 ENCOUNTER — Other Ambulatory Visit: Payer: Self-pay | Admitting: Internal Medicine

## 2019-02-05 ENCOUNTER — Other Ambulatory Visit (HOSPITAL_COMMUNITY): Payer: Self-pay | Admitting: Internal Medicine

## 2019-02-05 DIAGNOSIS — R51 Headache: Principal | ICD-10-CM

## 2019-02-05 DIAGNOSIS — R519 Headache, unspecified: Secondary | ICD-10-CM

## 2019-02-05 DIAGNOSIS — R443 Hallucinations, unspecified: Secondary | ICD-10-CM

## 2019-02-05 NOTE — Progress Notes (Signed)
Carelink Summary Report / Loop Recorder 

## 2019-02-11 ENCOUNTER — Telehealth: Payer: Self-pay

## 2019-02-11 NOTE — Telephone Encounter (Signed)
Pt on Eliquis for AF no need for manual transmission. need to discuss RRT does pt want LINQ explanted.

## 2019-02-11 NOTE — Telephone Encounter (Signed)
I left a message on the patient voicemail to send a manual transmission with his home monitor.

## 2019-02-12 NOTE — Telephone Encounter (Signed)
Pt aware of RRT; appt made to see Dr Rayann Heman on 4/1 @1500  to discuss replacement vs explant.

## 2019-02-13 DIAGNOSIS — M25561 Pain in right knee: Secondary | ICD-10-CM | POA: Diagnosis not present

## 2019-02-13 DIAGNOSIS — M1711 Unilateral primary osteoarthritis, right knee: Secondary | ICD-10-CM | POA: Diagnosis not present

## 2019-02-17 ENCOUNTER — Other Ambulatory Visit (HOSPITAL_COMMUNITY): Payer: Self-pay | Admitting: Orthopedic Surgery

## 2019-02-17 ENCOUNTER — Ambulatory Visit (HOSPITAL_COMMUNITY)
Admission: RE | Admit: 2019-02-17 | Discharge: 2019-02-17 | Disposition: A | Discharge status: Home / Self Care | Payer: Medicare Other | Source: Ambulatory Visit | Attending: Internal Medicine | Admitting: Internal Medicine

## 2019-02-17 DIAGNOSIS — R443 Hallucinations, unspecified: Secondary | ICD-10-CM

## 2019-02-17 DIAGNOSIS — R51 Headache: Secondary | ICD-10-CM | POA: Insufficient documentation

## 2019-02-17 DIAGNOSIS — M25561 Pain in right knee: Secondary | ICD-10-CM

## 2019-02-17 DIAGNOSIS — R519 Headache, unspecified: Secondary | ICD-10-CM

## 2019-02-25 ENCOUNTER — Ambulatory Visit (HOSPITAL_COMMUNITY)
Admission: RE | Admit: 2019-02-25 | Discharge: 2019-02-25 | Disposition: A | Discharge status: Home / Self Care | Payer: Medicare Other | Source: Ambulatory Visit | Attending: Orthopedic Surgery | Admitting: Orthopedic Surgery

## 2019-02-25 DIAGNOSIS — M25561 Pain in right knee: Secondary | ICD-10-CM | POA: Diagnosis not present

## 2019-02-25 DIAGNOSIS — M23361 Other meniscus derangements, other lateral meniscus, right knee: Secondary | ICD-10-CM | POA: Diagnosis not present

## 2019-02-27 ENCOUNTER — Ambulatory Visit (INDEPENDENT_AMBULATORY_CARE_PROVIDER_SITE_OTHER): Payer: Medicare Other | Admitting: *Deleted

## 2019-02-27 DIAGNOSIS — I6389 Other cerebral infarction: Secondary | ICD-10-CM

## 2019-02-28 DIAGNOSIS — M25561 Pain in right knee: Secondary | ICD-10-CM | POA: Diagnosis not present

## 2019-02-28 DIAGNOSIS — M1711 Unilateral primary osteoarthritis, right knee: Secondary | ICD-10-CM | POA: Diagnosis not present

## 2019-02-28 DIAGNOSIS — M1712 Unilateral primary osteoarthritis, left knee: Secondary | ICD-10-CM | POA: Diagnosis not present

## 2019-02-28 LAB — CUP PACEART REMOTE DEVICE CHECK
Date Time Interrogation Session: 20200305131231
Implantable Pulse Generator Implant Date: 20160816

## 2019-03-10 NOTE — Progress Notes (Signed)
Carelink Summary Report / Loop Recorder 

## 2019-03-20 ENCOUNTER — Telehealth: Payer: Self-pay

## 2019-03-21 ENCOUNTER — Other Ambulatory Visit: Payer: Self-pay

## 2019-03-21 ENCOUNTER — Telehealth (INDEPENDENT_AMBULATORY_CARE_PROVIDER_SITE_OTHER): Payer: Medicare Other | Admitting: Internal Medicine

## 2019-03-21 DIAGNOSIS — I480 Paroxysmal atrial fibrillation: Secondary | ICD-10-CM

## 2019-03-21 DIAGNOSIS — I712 Thoracic aortic aneurysm, without rupture, unspecified: Secondary | ICD-10-CM

## 2019-03-21 NOTE — Progress Notes (Signed)
Electrophysiology TeleHealth Note   Due to national recommendations of social distancing due to COVID 19, an audio/video telehealth visit is felt to be most appropriate for this patient at this time.  See MyChart message from today for the patient's consent to telehealth for [facility]   Date:  03/21/2019   ID:  Kyle Owen, DOB Nov 05, 1941, MRN [id number]  Location: patient's home  Provider location: [address], [place]  Evaluation Performed: Follow-up visit  PCP:  Asencion Noble, MD  Cardiologist:  Dr Marlou Porch Electrophysiologist:  Dr Rayann Heman  Chief Complaint:  afib  History of Present Illness:    Kyle Owen is a 78 y.o. male who presents via audio/video conferencing for a telehealth visit today.  Since last being seen in our clinic, the patient reports doing very well.  Today, he denies symptoms of palpitations, chest pain, shortness of breath,  lower extremity edema, dizziness, presyncope, or syncope.  The patient is otherwise without complaint today.  The patient denies symptoms of fevers, chills, cough, or new SOB worrisome for COVID 19.   Past Medical History:  Diagnosis Date  . Arthritis    DJD right knee  . Benign prostatic hypertrophy with urinary frequency   . Bladder cancer ([place])   . COPD with emphysema ([place])   . Depression   . Dyspnea    when walking  . Dysrhythmia    a fib  . History of colon polyps   . History of pneumonia    hx Recurrent -- last bout Dec 2016 CAP-- per pt resolved  . 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 Hyperlipidemia   . 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 Nephrolithiasis    right non-obstructive  per CT 05-02-2016  .  Ocular migraine    controlled w/ verapamil  . PAF (paroxysmal atrial fibrillation) ([place])    followed by AFIB clinic-- cardiologist-- dr Marlou Porch  . Pneumonia    history of pnu.  . Pulmonary nodule, left    left lower lobe x2 per CT 01-20-2016  . Renal cyst, left   . Thoracic aortic aneurysm without rupture ([place])    ascending -- ct chest 01/10/16  4.8cm  . Wears hearing aid    bilateral-- wear at times    Past Surgical History:  Procedure Laterality Date  . ANTERIOR CERVICAL DECOMP/DISCECTOMY FUSION N/A 04/03/2017   Procedure: Cervical three-four, Cervical four-five Anterior cervical decompression/discectomy/fusion;  Surgeon: Erline Levine, MD;  Location: [place];  Service: Neurosurgery;  Laterality: N/A;  . CATARACT EXTRACTION W/ INTRAOCULAR LENS IMPLANT Right 2016  . CATARACT EXTRACTION W/PHACO  12/16/2012   Procedure: CATARACT EXTRACTION PHACO AND INTRAOCULAR LENS PLACEMENT (IOC);  Surgeon: Tonny Branch, MD;  [facility];  Service: Ophthalmology;  Laterality: Left;  CDE:17.31  . COLONOSCOPY  10/03/2011   Procedure: COLONOSCOPY;  Surgeon: Jamesetta So;  [facility];  Service: Gastroenterology;  Laterality: N/A;  . DECOMPRESSIOIN ULNAR NERVE AND CUBITAL TUNNEL RELEASE Left 07-14-2009   elbow  . 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 INGUINAL HERNIA REPAIR Right 1990's  . KNEE ARTHROSCOPY Right 2015  . LEFT CUBITAL TUNNEL RELEASE    .  POSTERIOR LUMBAR FUSION  2014  . SHOULDER ARTHROSCOPY Left 1990's  . TEE WITHOUT CARDIOVERSION N/A 07/27/2015   Procedure: TRANSESOPHAGEAL ECHOCARDIOGRAM (TEE);  Surgeon: Lelon Perla, MD;  Location[facility];  Service: Cardiovascular;  Laterality: N/A;   normal LV function, ef 55-60%,  mild AR and MR, mild dilated ascending aorta (4.2cm),  mild to moderate atherosclerosis  descending aorta,  mild to moderate TR,  negative saline microcavitation study  . THYROIDECTOMY Right  01/20/2015   Procedure: RIGHT THYROIDECTOMY;  Surgeon: Ascencion Dike, MD;  Location: [place];  Service: ENT;  Laterality: Right;  . TONSILLECTOMY  as child  . TRANSURETHRAL RESECTION OF BLADDER TUMOR N/A 05/15/2016   Procedure: TRANSURETHRAL RESECTION OF BLADDER TUMOR (TURBT) AND INSTILLATION OF EPIRUBICIN;  Surgeon: Franchot Gallo, MD;  Location: [facility];  Service: Urology;  Laterality: N/A;    Current Outpatient Medications  Medication Sig Dispense Refill  . acetaminophen (TYLENOL) 500 MG tablet Take 500 mg by mouth daily.     Marland Kitchen atorvastatin (LIPITOR) 20 MG tablet TAKE 1 TABLET BY MOUTH DAILY. 90 tablet 2  . budesonide-formoterol (SYMBICORT) 160-4.5 MCG/ACT inhaler Inhale 2 puffs into the lungs 2 (two) times daily.    Marland Kitchen ELIQUIS 5 MG TABS tablet TAKE (1) TABLET BY MOUTH TWICE DAILY. 60 tablet 11  . finasteride (PROSCAR) 5 MG tablet Take 5 mg by mouth every morning.     . Magnesium 400 MG CAPS Take 400 mg by mouth every morning.     . sertraline (ZOLOFT) 50 MG tablet Take 50 mg by mouth every morning.     . VENTOLIN HFA 108 (90 BASE) MCG/ACT inhaler Inhale 2 puffs into the lungs Every 4 hours as needed for wheezing or shortness of breath. Reported on 12/23/2015    . verapamil (CALAN-SR) 240 MG CR tablet Take 240 mg by mouth every morning.      No current facility-administered medications for this visit.     Allergies:   Flomax [tamsulosin hcl] and Tamsulosin   Social History:  The patient  reports that he quit smoking about 8 years ago. His smoking use included cigarettes. He has a 45.00 pack-year smoking history. He has never used smokeless tobacco. He reports that he does not drink alcohol or use drugs.   Family History:  The patient's  family history includes Breast cancer in his sister; Stroke in his brother and sister.   ROS:  Please see the history of present illness.   All other systems are personally reviewed and negative.    Exam:    Vital Signs:  Does not  take his vitals at home.  Well appearing, alert and conversant, regular work of breathing,  good skin color Eyes- anicteric, neuro- grossly intact, skin- no apparent rash or lesions or cyanosis, mouth- oral mucosa is pink   Labs/Other Tests and Data Reviewed:    Recent Labs: No results found for requested labs within last 8760 hours.   Wt Readings from Last 3 Encounters:  10/23/18 218 lb 6.4 oz (99.1 kg)  08/28/18 215 lb (97.5 kg)  04/20/18 216 lb (98 kg)     Other studies personally reviewed: Additional studies/ records that were reviewed today include: my prior device implant, Dr Marlou Porch notes  Review of the above records today demonstrates: as above Prior radiographs: N/A   Last device remote is reviewed from [employer] PDF dated 02/27/2019 which reveals loop recorder at RRT.  He does have confirmed afib, though burden is low  ASSESSMENT & PLAN:    1.  Paroxysmal atrial fibrillation On eliquis given prior stroke He does have AF confirmed on ILR His loop recorder is at RRT.  We discussed at length today.  He is very clear that he would like to have a new loop recorder implanted.  He worries a out asymptomatic elevation in heart rates and risks associated with his afib.  I agree that a new loop recorder for afib management would be reasonable. Risks and benefits to ILR removal with new device placement were discussed at length with the patient today who wishes to proceed.  We will defer the procedure until after COVID 19 and then bring into the office to reschedule.  2. Thoracic aneurysm Stable Followed by Dr Marlou Porch  3. HO Stable No change required today  4. COVID 19 screen The patient denies symptoms of COVID 19 at this time.  The importance of social distancing was discussed today.  Follow-up:  We will arrange office visit for new ILR after COVID 19  Current medicines are reviewed at length with the patient today.   The patient does not have concerns regarding his  medicines.  The following changes were made today:  none  Labs/ tests ordered today include:  No orders of the defined types were placed in this encounter.    Patient Risk:  after full review of this patients clinical status, I feel that they are at moderate risk at this time.  Today, I have spent 22 minutes with the patient with telehealth technology discussing afib and ILR management .    Army Fossa, MD  03/21/2019 10:43 AM     [facility] [address] [place]  [address] [phone] (office) [phone] (fax)

## 2019-03-26 ENCOUNTER — Encounter: Payer: Medicare Other | Admitting: Internal Medicine

## 2019-05-26 ENCOUNTER — Telehealth: Payer: Self-pay

## 2019-05-26 NOTE — Telephone Encounter (Signed)
Left detailed message advising Pt that Dr. Rayann Heman would be able to explant/reimplant Mr. Geiman's loop recorder if he was interested June 04 2019.  Advised to return call to office if interested.

## 2019-05-29 NOTE — Telephone Encounter (Signed)
Spoke with patient. He is not comfortable coming into the office for procedures at this time. He asks that we call back in August to re-visit.  Chanetta Marshall, NP 05/29/2019 9:30 AM

## 2019-06-02 DIAGNOSIS — I480 Paroxysmal atrial fibrillation: Secondary | ICD-10-CM | POA: Diagnosis not present

## 2019-06-02 DIAGNOSIS — Z7901 Long term (current) use of anticoagulants: Secondary | ICD-10-CM | POA: Diagnosis not present

## 2019-06-02 DIAGNOSIS — Z79899 Other long term (current) drug therapy: Secondary | ICD-10-CM | POA: Diagnosis not present

## 2019-06-02 DIAGNOSIS — E785 Hyperlipidemia, unspecified: Secondary | ICD-10-CM | POA: Diagnosis not present

## 2019-06-02 DIAGNOSIS — N183 Chronic kidney disease, stage 3 (moderate): Secondary | ICD-10-CM | POA: Diagnosis not present

## 2019-06-09 DIAGNOSIS — N183 Chronic kidney disease, stage 3 (moderate): Secondary | ICD-10-CM | POA: Diagnosis not present

## 2019-06-09 DIAGNOSIS — I712 Thoracic aortic aneurysm, without rupture: Secondary | ICD-10-CM | POA: Diagnosis not present

## 2019-06-09 DIAGNOSIS — I480 Paroxysmal atrial fibrillation: Secondary | ICD-10-CM | POA: Diagnosis not present

## 2019-07-31 ENCOUNTER — Other Ambulatory Visit: Payer: Self-pay | Admitting: Cardiothoracic Surgery

## 2019-07-31 DIAGNOSIS — I712 Thoracic aortic aneurysm, without rupture, unspecified: Secondary | ICD-10-CM

## 2019-07-31 DIAGNOSIS — I7121 Aneurysm of the ascending aorta, without rupture: Secondary | ICD-10-CM

## 2019-09-02 ENCOUNTER — Ambulatory Visit (INDEPENDENT_AMBULATORY_CARE_PROVIDER_SITE_OTHER): Payer: Medicare Other | Admitting: Urology

## 2019-09-02 DIAGNOSIS — Z8551 Personal history of malignant neoplasm of bladder: Secondary | ICD-10-CM

## 2019-09-08 ENCOUNTER — Telehealth: Payer: Self-pay

## 2019-09-17 ENCOUNTER — Encounter: Payer: Self-pay | Admitting: Cardiothoracic Surgery

## 2019-09-17 ENCOUNTER — Ambulatory Visit (INDEPENDENT_AMBULATORY_CARE_PROVIDER_SITE_OTHER): Payer: Medicare Other | Admitting: Cardiothoracic Surgery

## 2019-09-17 ENCOUNTER — Other Ambulatory Visit: Payer: Self-pay

## 2019-09-17 ENCOUNTER — Ambulatory Visit
Admission: RE | Admit: 2019-09-17 | Discharge: 2019-09-17 | Disposition: A | Discharge status: Home / Self Care | Payer: Medicare Other | Source: Ambulatory Visit | Attending: Cardiothoracic Surgery | Admitting: Cardiothoracic Surgery

## 2019-09-17 VITALS — BP 131/74 | HR 62 | Temp 97.7°F | Resp 16 | Ht 73.0 in | Wt 210.0 lb

## 2019-09-17 DIAGNOSIS — I712 Thoracic aortic aneurysm, without rupture, unspecified: Secondary | ICD-10-CM

## 2019-09-17 DIAGNOSIS — I7121 Aneurysm of the ascending aorta, without rupture: Secondary | ICD-10-CM

## 2019-09-17 DIAGNOSIS — I6389 Other cerebral infarction: Secondary | ICD-10-CM

## 2019-09-17 MED ORDER — IOPAMIDOL (ISOVUE-300) INJECTION 61%
50.0000 mL | Freq: Once | INTRAVENOUS | Status: AC | PRN
  Administered 2019-09-17: 50 mL via INTRAVENOUS

## 2019-09-18 ENCOUNTER — Encounter: Payer: Self-pay | Admitting: Cardiothoracic Surgery

## 2019-09-18 NOTE — Progress Notes (Signed)
PCP is Asencion Noble, MD Referring Provider is Asencion Noble, MD  Chief Complaint  Patient presents with  . TAA    1 yr f/u with CT CHEST    HPI: Asymptomatic stable 4.7 cm ascending fusiform aneurysm since 2017. Annual scan shows no change in the aorta. No mural hematoma or ulceration. His BP is controlled with Cardizem and his chronic afib is rate controlled and treated with eliquis  Past Medical History:  Diagnosis Date  . Arthritis    DJD right knee  . Benign prostatic hypertrophy with urinary frequency   . Bladder cancer ([place])   . COPD with emphysema ([place])   . Depression   . Dyspnea    when walking  . Dysrhythmia    a fib  . History of colon polyps   . History of pneumonia    hx Recurrent -- last bout Dec 2016 CAP-- per pt resolved  . 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 Hyperlipidemia   . 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 Nephrolithiasis    right non-obstructive  per CT 05-02-2016  . Ocular migraine    controlled w/ verapamil  . PAF (paroxysmal atrial fibrillation) ([place])    followed by AFIB clinic-- cardiologist-- dr Marlou Porch  . Pneumonia    history of pnu.  . Pulmonary nodule, left    left lower lobe x2 per CT 01-20-2016  . Renal cyst, left   . Thoracic aortic aneurysm without rupture ([place])    ascending -- ct chest 01/10/16  4.8cm  . Wears hearing aid    bilateral-- wear at times    Past Surgical History:  Procedure Laterality Date  . ANTERIOR CERVICAL DECOMP/DISCECTOMY FUSION N/A 04/03/2017   Procedure: Cervical three-four, Cervical four-five Anterior cervical decompression/discectomy/fusion;  Surgeon: Erline Levine, MD;  Location: [place];  Service: Neurosurgery;  Laterality:  N/A;  . CATARACT EXTRACTION W/ INTRAOCULAR LENS IMPLANT Right 2016  . CATARACT EXTRACTION W/PHACO  12/16/2012   Procedure: CATARACT EXTRACTION PHACO AND INTRAOCULAR LENS PLACEMENT (IOC);  Surgeon: Tonny Branch, MD;  [facility];  Service: Ophthalmology;  Laterality: Left;  CDE:17.31  . COLONOSCOPY  10/03/2011   Procedure: COLONOSCOPY;  Surgeon: Jamesetta So;  [facility];  Service: Gastroenterology;  Laterality: N/A;  . DECOMPRESSIOIN ULNAR NERVE AND CUBITAL TUNNEL RELEASE Left 07-14-2009   elbow  . 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 INGUINAL HERNIA REPAIR Right 1990's  . KNEE ARTHROSCOPY Right 2015  . LEFT CUBITAL TUNNEL RELEASE    . POSTERIOR LUMBAR FUSION  2014  . SHOULDER ARTHROSCOPY Left 1990's  . TEE WITHOUT CARDIOVERSION N/A 07/27/2015   Procedure: TRANSESOPHAGEAL ECHOCARDIOGRAM (TEE);  Surgeon: Lelon Perla, MD;  Location: [facility];  Service: Cardiovascular;  Laterality: N/A;   normal LV function, ef 55-60%,  mild AR and MR, mild dilated ascending aorta (4.2cm),  mild to moderate atherosclerosis  descending aorta,  mild to moderate TR,  negative saline microcavitation study  . THYROIDECTOMY Right 01/20/2015   Procedure: RIGHT THYROIDECTOMY;  Surgeon: Ascencion Dike, MD;  Location: [place];  Service: ENT;  Laterality: Right;  . TONSILLECTOMY  as child  . TRANSURETHRAL RESECTION OF BLADDER TUMOR  N/A 05/15/2016   Procedure: TRANSURETHRAL RESECTION OF BLADDER TUMOR (TURBT) AND INSTILLATION OF EPIRUBICIN;  Surgeon: Franchot Gallo, MD;  Location: [facility];  Service: Urology;  Laterality: N/A;    Family History  Problem Relation Age of Onset  . Stroke Sister   . Breast cancer Sister   . Stroke Brother     Social History Social History   Tobacco Use  . Smoking status: Former Smoker    Packs/day: 1.00    Years: 45.00    Pack years: 45.00    Types: Cigarettes    Quit date: 09/26/2010     Years since quitting: 8.9  . Smokeless tobacco: Never Used  Substance Use Topics  . Alcohol use: No  . Drug use: No    Current Outpatient Medications  Medication Sig Dispense Refill  . acetaminophen (TYLENOL) 500 MG tablet Take 500 mg by mouth daily.     Marland Kitchen atorvastatin (LIPITOR) 20 MG tablet TAKE 1 TABLET BY MOUTH DAILY. 90 tablet 2  . budesonide-formoterol (SYMBICORT) 160-4.5 MCG/ACT inhaler Inhale 2 puffs into the lungs 2 (two) times daily.    Marland Kitchen ELIQUIS 5 MG TABS tablet TAKE (1) TABLET BY MOUTH TWICE DAILY. 60 tablet 11  . finasteride (PROSCAR) 5 MG tablet Take 5 mg by mouth every morning.     . Magnesium 400 MG CAPS Take 400 mg by mouth every morning.     . sertraline (ZOLOFT) 50 MG tablet Take 50 mg by mouth every morning.     . VENTOLIN HFA 108 (90 BASE) MCG/ACT inhaler Inhale 2 puffs into the lungs Every 4 hours as needed for wheezing or shortness of breath. Reported on 12/23/2015    . verapamil (CALAN-SR) 240 MG CR tablet Take 240 mg by mouth every morning.      No current facility-administered medications for this visit.     Allergies  Allergen Reactions  . Flomax [Tamsulosin Hcl] Nausea And Vomiting and Other (See Comments)    "Pt thought he was dying " DIZZY  . 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 No chest pain  Stopped smoking years ago BP 131/74 (BP Location: Right Arm, Patient Position: Sitting)   Pulse 62   Temp 97.7 F (36.5 C)   Resp 16   Ht 6\' 1"  (1.854 m)   Wt 210 lb (95.3 kg)   SpO2 93% Comment: ON RA  BMI 27.71 kg/m  Physical Exam      Exam    General- alert and comfortable    Neck- no JVD, no cervical adenopathy palpable, no carotid bruit   Lungs- clear without rales, wheezes   Cor- regular rate and rhythm, no murmur , gallop   Abdomen- soft, non-tender   Extremities - warm, non-tender, minimal edema   Neuro- oriented, appropriate, no focal  weakness   Diagnostic Tests: CT images personally reviewed and d/w patient, wife Stable 4.7 cm fusiform ascending aorta  Impression: Current size does not meet criteria for surgery Cont BP control- SAP < 166mm Hg   and annual surveillance scans  Plan: Return next year with CT scan  Len Childs, MD [facility] [phone]

## 2019-10-09 DIAGNOSIS — I712 Thoracic aortic aneurysm, without rupture: Secondary | ICD-10-CM | POA: Diagnosis not present

## 2019-10-09 DIAGNOSIS — Z23 Encounter for immunization: Secondary | ICD-10-CM | POA: Diagnosis not present

## 2019-10-09 DIAGNOSIS — I480 Paroxysmal atrial fibrillation: Secondary | ICD-10-CM | POA: Diagnosis not present

## 2019-10-09 DIAGNOSIS — J449 Chronic obstructive pulmonary disease, unspecified: Secondary | ICD-10-CM | POA: Diagnosis not present

## 2019-10-28 ENCOUNTER — Ambulatory Visit (INDEPENDENT_AMBULATORY_CARE_PROVIDER_SITE_OTHER): Payer: Medicare Other | Admitting: Cardiology

## 2019-10-28 ENCOUNTER — Other Ambulatory Visit: Payer: Self-pay

## 2019-10-28 ENCOUNTER — Encounter: Payer: Self-pay | Admitting: Cardiology

## 2019-10-28 VITALS — BP 120/62 | HR 69 | Ht 73.0 in | Wt 211.1 lb

## 2019-10-28 DIAGNOSIS — E785 Hyperlipidemia, unspecified: Secondary | ICD-10-CM

## 2019-10-28 DIAGNOSIS — I480 Paroxysmal atrial fibrillation: Secondary | ICD-10-CM

## 2019-10-28 DIAGNOSIS — I639 Cerebral infarction, unspecified: Secondary | ICD-10-CM | POA: Diagnosis not present

## 2019-10-28 DIAGNOSIS — I712 Thoracic aortic aneurysm, without rupture, unspecified: Secondary | ICD-10-CM

## 2019-10-28 NOTE — Progress Notes (Signed)
Cardiology Office Note:    Date:  10/28/2019   ID:  Kyle Owen, DOB Nov 19, 1941, MRN [id number]  PCP:  Asencion Noble, MD  Cardiologist:  Candee Furbish, MD  Electrophysiologist:  None   Referring MD: Asencion Noble, MD     History of Present Illness:    Kyle Owen is a 78 y.o. male here for follow-up of ascending aortic aneurysm, 4.7 cm noted in 2017 and 08/2019.  CT images personally reviewed. Sees Dr. Darcey Nora , no hematoma.   Currently on Eliquis for paroxysmal atrial fibrillation without any signs of bleeding. Loop implanted (to discuss explant, does not wish to pursue this at this time)  No myalgias on atorvastatin.  Seems to be doing well.  [place]  [place].  [place]er scanning, processed.  Past Medical History:  Diagnosis Date  . Arthritis    DJD right knee  . Benign prostatic hypertrophy with urinary frequency   . Bladder cancer ([place])   . COPD with emphysema ([place])   . Depression   . Dyspnea    when walking  . Dysrhythmia    a fib  . History of colon polyps   . History of pneumonia    hx Recurrent -- last bout Dec 2016 CAP-- per pt resolved  . 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 Hyperlipidemia   . 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 Nephrolithiasis    right non-obstructive  per CT 05-02-2016  . Ocular migraine    controlled w/ verapamil  . PAF (paroxysmal atrial fibrillation) ([place])    followed by AFIB clinic-- cardiologist-- dr Marlou Porch  . Pneumonia    history of pnu.  . Pulmonary nodule, left    left lower lobe x2 per CT 01-20-2016  . Renal cyst, left   . Thoracic aortic aneurysm without rupture ([place])    ascending -- ct chest 01/10/16  4.8cm  . Wears hearing aid     bilateral-- wear at times    Past Surgical History:  Procedure Laterality Date  . ANTERIOR CERVICAL DECOMP/DISCECTOMY FUSION N/A 04/03/2017   Procedure: Cervical three-four, Cervical four-five Anterior cervical decompression/discectomy/fusion;  Surgeon: Erline Levine, MD;  Location: [facility];  Service: Neurosurgery;  Laterality: N/A;  . CATARACT EXTRACTION W/ INTRAOCULAR LENS IMPLANT Right 2016  . CATARACT EXTRACTION W/PHACO  12/16/2012   Procedure: CATARACT EXTRACTION PHACO AND INTRAOCULAR LENS PLACEMENT (IOC);  Surgeon: Tonny Branch, MD;  [facility];  Service: Ophthalmology;  Laterality: Left;  CDE:17.31  . COLONOSCOPY  10/03/2011   Procedure: COLONOSCOPY;  Surgeon: Jamesetta So;  [facility];  Service: Gastroenterology;  Laterality: N/A;  . DECOMPRESSIOIN ULNAR NERVE AND CUBITAL TUNNEL RELEASE Left 07-14-2009   elbow  . 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 INGUINAL HERNIA REPAIR Right 1990's  . KNEE ARTHROSCOPY Right 2015  . LEFT CUBITAL TUNNEL RELEASE    . POSTERIOR LUMBAR FUSION  2014  . SHOULDER ARTHROSCOPY Left 1990's  . TEE WITHOUT CARDIOVERSION N/A 07/27/2015   Procedure: TRANSESOPHAGEAL ECHOCARDIOGRAM (TEE);  Surgeon: Lelon Perla, MD;  Location: [facility];  Service: Cardiovascular;  Laterality: N/A;   normal LV function, ef 55-60%,  mild  AR and MR, mild dilated ascending aorta (4.2cm),  mild to moderate atherosclerosis  descending aorta,  mild to moderate TR,  negative saline microcavitation study  . THYROIDECTOMY Right 01/20/2015   Procedure: RIGHT THYROIDECTOMY;  Surgeon: Ascencion Dike, MD;  Location: [facility];  Service: ENT;  Laterality: Right;  . TONSILLECTOMY  as child  . TRANSURETHRAL RESECTION OF BLADDER TUMOR N/A 05/15/2016   Procedure: TRANSURETHRAL RESECTION OF BLADDER TUMOR (TURBT) AND INSTILLATION OF EPIRUBICIN;  Surgeon: Franchot Gallo, MD;  Location: [facility];  Service:  Urology;  Laterality: N/A;    Current Medications: Current Meds  Medication Sig  . acetaminophen (TYLENOL) 500 MG tablet Take 500 mg by mouth daily.   Marland Kitchen atorvastatin (LIPITOR) 20 MG tablet TAKE 1 TABLET BY MOUTH DAILY.  . budesonide-formoterol (SYMBICORT) 160-4.5 MCG/ACT inhaler Inhale 2 puffs into the lungs 2 (two) times daily.  Marland Kitchen ELIQUIS 5 MG TABS tablet TAKE (1) TABLET BY MOUTH TWICE DAILY.  . finasteride (PROSCAR) 5 MG tablet Take 5 mg by mouth every morning.   . Magnesium 400 MG CAPS Take 400 mg by mouth every morning.   . sertraline (ZOLOFT) 50 MG tablet Take 50 mg by mouth every morning.   . VENTOLIN HFA 108 (90 BASE) MCG/ACT inhaler Inhale 2 puffs into the lungs Every 4 hours as needed for wheezing or shortness of breath. Reported on 12/23/2015  . verapamil (CALAN-SR) 240 MG CR tablet Take 240 mg by mouth every morning.      Allergies:   Flomax [tamsulosin hcl] and Tamsulosin   Social History   Socioeconomic History  . Marital status: Married    Spouse name: Not on file  . Number of children: 3  . Years of education: 20  . Highest education level: Not on file  Occupational History  . 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 Financial resource strain: Not on file  . Food insecurity    Worry: Not on file    Inability: Not on file  . Transportation needs    Medical: Not on file    Non-medical: Not on file  Tobacco Use  . Smoking status: Former Smoker    Packs/day: 1.00    Years: 45.00    Pack years: 45.00    Types: Cigarettes    Quit date: 09/26/2010    Years since quitting: 9.0  . Smokeless tobacco: Never Used  Substance and Sexual Activity  . Alcohol use: No  . Drug use: No  . Sexual activity: Not on file  Lifestyle  . Physical activity    Days per week: Not on file    Minutes per session: Not on file  . Stress: Not on file  Relationships  . Social Herbalist on phone: Not on file    Gets together: Not on file     Attends religious service: Not on file    Active member of club or organization: Not on file    Attends meetings of clubs or organizations: Not on file    Relationship status: Not on file  Other Topics Concern  . Not on file  Social History Narrative   Widowed, lives with dog, [place] in [place], wife passed from Parkinson's disease 3 1/2 yrs ago   1 cup coffee daily     Family History: The patient's family history includes Breast cancer in his sister; Stroke in his brother and sister.  ROS:   Please see  the history of present illness.    Dizziness, no fevers chills nausea vomiting syncope all other systems reviewed and are negative.  EKGs/Labs/Other Studies Reviewed:    The following studies were reviewed today: CT scan, EKG, lab work, prior office notes  EKG:  EKG is  ordered today.  10/28/2019-sinus rhythm 69 with no other abnormalities.  Sinus rhythm with PACs otherwise normal.  Personally reviewed.  Heart rate 67 bpm, no change from prior.   Recent Labs: No results found for requested labs within last 8760 hours.  Recent Lipid Panel    Component Value Date/Time   CHOL 225 (H) 06/09/2015 1040   TRIG 100 06/09/2015 1040   HDL 68 06/09/2015 1040   CHOLHDL 3.3 06/09/2015 1040   LDLCALC 137 (H) 06/09/2015 1040    Physical Exam:    VS:  BP 120/62   Pulse 69   Ht 6\' 1"  (1.854 m)   Wt 211 lb 1.9 oz (95.8 kg)   BMI 27.85 kg/m     Wt Readings from Last 3 Encounters:  10/28/19 211 lb 1.9 oz (95.8 kg)  09/17/19 210 lb (95.3 kg)  10/23/18 218 lb 6.4 oz (99.1 kg)     GEN: Well nourished, well developed, in no acute distress  HEENT: normal  Neck: no JVD, carotid bruits, or masses Cardiac: RRR; no murmurs, rubs, or gallops,no edema  Respiratory:  clear to auscultation bilaterally, normal work of breathing GI: soft, nontender, nondistended, + BS MS: no deformity or atrophy  Skin: warm and dry, no rash Neuro:  Alert and Oriented x 3, Strength and sensation are intact  Psych: euthymic mood, full affect   ASSESSMENT:    1. Paroxysmal atrial fibrillation (HCC)   2. Thoracic aortic aneurysm without rupture ([place])   3. Cryptogenic stroke ([place])   4. Hyperlipidemia, unspecified hyperlipidemia type    PLAN:    In order of problems listed above:  Paroxysmal atrial fibrillation -About 3% burden on loop recorder. Consider explant. Wanted to wait until after COVID.   Verapamil can be utilized for both migraine and ocular prophylaxis as well as atrial fibrillation control.  Has prior cryptogenic stroke. CHADSVASc score was 4.  On Eliquis.  Overall doing well without any problems.  Does have some minor bruising.  History of cryptogenic stroke -Atrial fibrillation was discovered.  Does have some disequilibrium.  Cannot get on the boat anymore.  Ascending aortic thoracic aneurysm - 4.7 cm stable.  Avoid Cipro.  Discussed.  Dr. Darcey Nora, stable.  Hyperlipidemia mixed - Encourage statin use atorvastatin continue to encourage monitoring of cholesterol.  Bladder cancer -Stable.  COPD -Former smoker 2011 quit, still short of breath when walking to and fro mailbox.  Balance issues/disequilibrium - Continue to encourage cane use.  Be careful.  He has fallen.  Eliquis.  No changes.   Medication Adjustments/Labs and Tests Ordered: Current medicines are reviewed at length with the patient today.  Concerns regarding medicines are outlined above.  Orders Placed This Encounter  Procedures  . EKG 12-Lead   No orders of the defined types were placed in this encounter.   Patient Instructions  Medication Instructions:  The current medical regimen is effective;  continue present plan and medications.  *If you need a refill on your cardiac medications before your next appointment, please call your pharmacy*  Follow-Up: At [facility], you and your health needs are our priority.  As part of our continuing mission to provide you with exceptional heart care, we  have created  designated Provider Care Teams.  These Care Teams include your primary Cardiologist (physician) and Advanced Practice Providers (APPs -  Physician Assistants and Nurse Practitioners) who all work together to provide you with the care you need, when you need it.  Your next appointment:   12 months  The format for your next appointment:   In Person  Provider:   You may see Candee Furbish, MD or one of the following Advanced Practice Providers on your designated Care Team:    Truitt Merle, NP  Cecilie Kicks, NP  Kathyrn Drown, NP   Thank you for choosing [facility]!!        Signed, Candee Furbish, MD  10/28/2019 9:13 AM    [place]

## 2019-10-28 NOTE — Patient Instructions (Signed)
Medication Instructions:  The current medical regimen is effective;  continue present plan and medications.  *If you need a refill on your cardiac medications before your next appointment, please call your pharmacy*  Follow-Up: At [facility], you and your health needs are our priority.  As part of our continuing mission to provide you with exceptional heart care, we have created designated Provider Care Teams.  These Care Teams include your primary Cardiologist (physician) and Advanced Practice Providers (APPs -  Physician Assistants and Nurse Practitioners) who all work together to provide you with the care you need, when you need it.  Your next appointment:   12 months  The format for your next appointment:   In Person  Provider:   You may see Mark Skains, MD or one of the following Advanced Practice Providers on your designated Care Team:    Lori Gerhardt, NP  Laura Ingold, NP  Jill McDaniel, NP   Thank you for choosing [facility]!!      

## 2019-11-11 DIAGNOSIS — H35033 Hypertensive retinopathy, bilateral: Secondary | ICD-10-CM | POA: Diagnosis not present

## 2019-11-11 DIAGNOSIS — H5203 Hypermetropia, bilateral: Secondary | ICD-10-CM | POA: Diagnosis not present

## 2019-11-11 DIAGNOSIS — H52223 Regular astigmatism, bilateral: Secondary | ICD-10-CM | POA: Diagnosis not present

## 2020-01-06 DIAGNOSIS — D090 Carcinoma in situ of bladder: Secondary | ICD-10-CM | POA: Diagnosis not present

## 2020-01-06 DIAGNOSIS — R319 Hematuria, unspecified: Secondary | ICD-10-CM | POA: Diagnosis not present

## 2020-01-12 ENCOUNTER — Other Ambulatory Visit: Payer: Self-pay

## 2020-01-20 ENCOUNTER — Telehealth: Payer: Self-pay | Admitting: Urology

## 2020-01-20 NOTE — Telephone Encounter (Signed)
Pt wife left a VM stating she did not want to wait until march for an appt. States the pt is has blood in his urine and wishes to speak to a doctor or nurse as soon as possible.

## 2020-01-20 NOTE — Telephone Encounter (Signed)
I sent back a message already on this pt to tracy earlier to schedule in feb.Marland KitchenMarland KitchenMarland Kitchen

## 2020-01-29 DIAGNOSIS — Z23 Encounter for immunization: Secondary | ICD-10-CM | POA: Diagnosis not present

## 2020-02-03 ENCOUNTER — Ambulatory Visit: Payer: Medicare Other | Attending: Internal Medicine

## 2020-02-03 ENCOUNTER — Ambulatory Visit (INDEPENDENT_AMBULATORY_CARE_PROVIDER_SITE_OTHER): Payer: Medicare Other | Admitting: Urology

## 2020-02-03 ENCOUNTER — Other Ambulatory Visit: Payer: Self-pay

## 2020-02-03 VITALS — BP 130/68 | HR 64 | Temp 96.4°F | Ht 73.0 in | Wt 220.0 lb

## 2020-02-03 DIAGNOSIS — Z20822 Contact with and (suspected) exposure to covid-19: Secondary | ICD-10-CM

## 2020-02-03 DIAGNOSIS — C678 Malignant neoplasm of overlapping sites of bladder: Secondary | ICD-10-CM | POA: Diagnosis not present

## 2020-02-03 DIAGNOSIS — R3129 Other microscopic hematuria: Secondary | ICD-10-CM | POA: Diagnosis not present

## 2020-02-03 DIAGNOSIS — J441 Chronic obstructive pulmonary disease with (acute) exacerbation: Secondary | ICD-10-CM | POA: Diagnosis not present

## 2020-02-03 LAB — POCT URINALYSIS DIPSTICK
Bilirubin, UA: POSITIVE
Glucose, UA: NEGATIVE
Leukocytes, UA: NEGATIVE
Nitrite, UA: NEGATIVE
Protein, UA: NEGATIVE
Spec Grav, UA: >=1.03 — AB (ref 1.010–1.025)
Urobilinogen, UA: NEGATIVE E.U./dL — AB
pH, UA: 5 (ref 5.0–8.0)

## 2020-02-03 NOTE — Progress Notes (Signed)
Urological Symptom Review   Patient is experiencing the following symptoms:  Frequency  Getting up at night Blood in urine Weak stream  Review of Systems  Gastrointestinal (upper)  : Negative for upper GI symptoms  Gastrointestinal (lower) : Negative for lower GI symptoms  Constitutional : Fatigue  Skin: Negative for skin symptoms  Eyes: Negative  Ear/Nose/Throat : Sinus problems  Hematologic/Lymphatic: Negative for Hematologic/Lymphatic symptoms  Cardiovascular : Negative for cardiovascular symptoms  Respiratory : Cough Shortness of breath  Endocrine: Negative for endocrine symptoms  Musculoskeletal: Back pain Joint pain  Neurological: Dizziness  Psychologic: Negative for psychiatric symptoms

## 2020-02-03 NOTE — Progress Notes (Signed)
H&P  Chief Complaint: Bladder Cancer  History of Present Illness:   2.9.2021:He has had recent painless gross hematuria. He is on Eliquis. Hematuria has cleared. He is here for cystoscopy.   (below copied from [facility] records):  Bladder Cancer:    Kyle Owen is a 79 year-old male established patient who is here for follow-up of bladder cancer treatment.  His bladder cancer was superficial and limitied to the bladder lining.   He did have a TURBT. His last bladder tumor was resected 05/15/2016.   Initial TURBT of a 2 cm papillary bladder tumor on 5.22.2017.  Pathology revealed papillary low-grade NMIBC. Epirubicin was placed post resection.       Past Medical History:  Diagnosis Date  . Arthritis    DJD right knee  . Benign prostatic hypertrophy with urinary frequency   . Bladder cancer ([place])   . COPD with emphysema ([place])   . Depression   . Dyspnea    when walking  . Dysrhythmia    a fib  . History of colon polyps   . History of pneumonia    hx Recurrent -- last bout Dec 2016 CAP-- per pt resolved  . 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 Hyperlipidemia   . 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 Nephrolithiasis    right non-obstructive  per CT 05-02-2016  . Ocular migraine    controlled w/ verapamil  . PAF (paroxysmal atrial fibrillation) ([place])    followed by AFIB clinic-- cardiologist-- dr Marlou Porch  . Pneumonia    history of pnu.  . Pulmonary nodule, left    left lower lobe x2 per CT 01-20-2016  . Renal cyst, left   . Thoracic aortic aneurysm without rupture ([place])    ascending -- ct chest 01/10/16  4.8cm  . Wears hearing aid    bilateral-- wear at times    Past Surgical  History:  Procedure Laterality Date  . ANTERIOR CERVICAL DECOMP/DISCECTOMY FUSION N/A 04/03/2017   Procedure: Cervical three-four, Cervical four-five Anterior cervical decompression/discectomy/fusion;  Surgeon: Erline Levine, MD;  Location: [facility];  Service: Neurosurgery;  Laterality: N/A;  . CATARACT EXTRACTION W/ INTRAOCULAR LENS IMPLANT Right 2016  . CATARACT EXTRACTION W/PHACO  12/16/2012   Procedure: CATARACT EXTRACTION PHACO AND INTRAOCULAR LENS PLACEMENT (IOC);  Surgeon: Tonny Branch, MD;  [facility];  Service: Ophthalmology;  Laterality: Left;  CDE:17.31  . COLONOSCOPY  10/03/2011   Procedure: COLONOSCOPY;  Surgeon: Jamesetta So;  [facility];  Service: Gastroenterology;  Laterality: N/A;  . DECOMPRESSIOIN ULNAR NERVE AND CUBITAL TUNNEL RELEASE Left 07-14-2009   elbow  . 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 INGUINAL HERNIA REPAIR Right 1990's  . KNEE ARTHROSCOPY Right 2015  . LEFT CUBITAL TUNNEL RELEASE    . POSTERIOR LUMBAR FUSION  2014  . SHOULDER ARTHROSCOPY Left 1990's  . TEE WITHOUT CARDIOVERSION N/A 07/27/2015   Procedure: TRANSESOPHAGEAL ECHOCARDIOGRAM (TEE);  Surgeon: Lelon Perla, MD;  Location: [facility] ENDOSCOPY;  Service: Cardiovascular;  Laterality: N/A;   normal LV function, ef 55-60%,  mild AR and MR, mild dilated ascending aorta (4.2cm),  mild to moderate atherosclerosis  descending aorta,  mild to moderate  TR,  negative saline microcavitation study  . THYROIDECTOMY Right 01/20/2015   Procedure: RIGHT THYROIDECTOMY;  Surgeon: Ascencion Dike, MD;  Location: [facility];  Service: ENT;  Laterality: Right;  . TONSILLECTOMY  as child  . TRANSURETHRAL RESECTION OF BLADDER TUMOR N/A 05/15/2016   Procedure: TRANSURETHRAL RESECTION OF BLADDER TUMOR (TURBT) AND INSTILLATION OF EPIRUBICIN;  Surgeon: Franchot Gallo, MD;  Location: [facility];  Service: Urology;  Laterality: N/A;    Home Medications:    Allergies as of 02/03/2020      Reactions   Flomax [tamsulosin Hcl] Nausea And Vomiting, Other (See Comments)   "Pt thought he was dying " DIZZY   Tamsulosin Nausea Only      Medication List       Accurate as of February 03, 2020  8:28 AM. If you have any questions, ask your nurse or doctor.        acetaminophen 500 MG tablet Commonly known as: TYLENOL Take 500 mg by mouth daily.   atorvastatin 20 MG tablet Commonly known as: LIPITOR TAKE 1 TABLET BY MOUTH DAILY.   budesonide-formoterol 160-4.5 MCG/ACT inhaler Commonly known as: SYMBICORT Inhale 2 puffs into the lungs 2 (two) times daily.   Eliquis 5 MG Tabs tablet Generic drug: apixaban TAKE (1) TABLET BY MOUTH TWICE DAILY.   finasteride 5 MG tablet Commonly known as: PROSCAR Take 5 mg by mouth every morning.   Magnesium 400 MG Caps Take 400 mg by mouth every morning.   sertraline 50 MG tablet Commonly known as: ZOLOFT Take 50 mg by mouth every morning.   Ventolin HFA 108 (90 Base) MCG/ACT inhaler Generic drug: albuterol Inhale 2 puffs into the lungs Every 4 hours as needed for wheezing or shortness of breath. Reported on 12/23/2015   verapamil 240 MG CR tablet Commonly known as: CALAN-SR Take 240 mg by mouth every morning.       Allergies:  Allergies  Allergen Reactions  . Flomax [Tamsulosin Hcl] Nausea And Vomiting and Other (See Comments)    "Pt thought he was dying " DIZZY  . Tamsulosin Nausea Only    Family History  Problem Relation Age of Onset  . Stroke Sister   . Breast cancer Sister   . Stroke Brother     Social History:  reports that he quit smoking about 9 years ago. His smoking use included cigarettes. He has a 45.00 pack-year smoking history. He has never used smokeless tobacco. He reports that he does not drink alcohol or use drugs.  ROS: A complete review of systems was performed.  All systems are negative except for pertinent findings as noted.  Physical Exam:  Vital signs in  last 24 hours: There were no vitals taken for this visit. Constitutional:  Alert and oriented, No acute distress Cardiovascular: Regular rate  Respiratory: Normal respiratory effort Genitourinary: Normal male phallus, testes are descended bilaterally and non-tender and without masses, scrotum is normal in appearance without lesions or masses, perineum is normal on inspection. Lymphatic: No lymphadenopathy Neurologic: Grossly intact, no focal deficits Psychiatric: Normal mood and affect  Laboratory Data:  No results for input(s): WBC, HGB, HCT, PLT in the last 72 hours.  No results for input(s): NA, K, CL, GLUCOSE, BUN, CALCIUM, CREATININE in the last 72 hours.  Invalid input(s): CO3   No results found for this or any previous visit (from the past 24 hour(s)). No results found for this or any previous visit (from the past 240 hour(s)).  Renal Function:  No results for input(s): CREATININE in the last 168 hours. CrCl cannot be calculated ([name] most recent lab result is older than the maximum 21 days allowed.).  Radiologic Imaging: No results found.  I have reviewed prior pt notes  I have reviewed urinalysis results  Cystoscopy Procedure Note:  Indication: Hematuria, followup of NMIBC.  After informed consent and discussion of the procedure and its risks, Kyle Owen was positioned and prepped in the standard fashion. Cystoscopy was performed with a flexible cystoscope. The urethra, bladder neck and entire bladder was visualized in a standard fashion. The prostate was obstructive The ureteral orifices were visualized in their normal location and orientation. Small recurrence @ rt U/O. No other bladder lesions noted. Bladder trabeculated.  I    Impression/Assessment:  History of nonmuscle invasive bladder cancer, status post prior resection approximately 4 years ago.  Possible recurrence at right ureteral orifice.  Recent gross hematuria needs further  evaluation.  Plan:  1.  I will arrange for patient to have a hematuria protocol CT  2.  More than likely, patient will subsequently need cystoscopy, right retrograde and possible ureteroscopy

## 2020-02-04 LAB — NOVEL CORONAVIRUS, NAA: SARS-CoV-2, NAA: NOT DETECTED

## 2020-02-06 ENCOUNTER — Telehealth: Payer: Self-pay

## 2020-02-06 NOTE — Telephone Encounter (Signed)
Patient and his wife called for his COVID-19 test result done 02/03/20. They were informed that his test was negative and he did not have the Novel Coronavirus.  They verbalized understanding.

## 2020-02-27 ENCOUNTER — Ambulatory Visit (HOSPITAL_COMMUNITY)
Admission: RE | Admit: 2020-02-27 | Discharge: 2020-02-27 | Disposition: A | Discharge status: Home / Self Care | Payer: Medicare Other | Source: Ambulatory Visit | Attending: Urology | Admitting: Urology

## 2020-02-27 ENCOUNTER — Other Ambulatory Visit: Payer: Self-pay

## 2020-02-27 DIAGNOSIS — R3129 Other microscopic hematuria: Secondary | ICD-10-CM | POA: Diagnosis not present

## 2020-02-27 DIAGNOSIS — N202 Calculus of kidney with calculus of ureter: Secondary | ICD-10-CM | POA: Diagnosis not present

## 2020-02-27 DIAGNOSIS — Z23 Encounter for immunization: Secondary | ICD-10-CM | POA: Diagnosis not present

## 2020-02-27 LAB — POCT I-STAT CREATININE: Creatinine, Ser: 1.6 mg/dL — ABNORMAL HIGH (ref 0.61–1.24)

## 2020-02-27 MED ORDER — IOHEXOL 300 MG/ML  SOLN
125.0000 mL | Freq: Once | INTRAMUSCULAR | Status: AC | PRN
  Administered 2020-02-27: 100 mL via INTRAVENOUS

## 2020-03-01 ENCOUNTER — Telehealth: Payer: Self-pay

## 2020-03-01 NOTE — Telephone Encounter (Signed)
Due for yearly follow up.  Last year had wanted loop explanted and reimplanted.  Please schedule for yearly office visit with Dr. Rayann Heman.  It can be virtual if he prefers.

## 2020-03-01 NOTE — Telephone Encounter (Signed)
3 month recall entered.  Pt refuses virtual appt.

## 2020-03-02 ENCOUNTER — Telehealth: Payer: Self-pay | Admitting: Urology

## 2020-03-02 NOTE — Telephone Encounter (Signed)
Patients wife called requesting results of CT.

## 2020-03-03 NOTE — Telephone Encounter (Signed)
Pt asking for ct results.

## 2020-03-09 NOTE — Telephone Encounter (Signed)
-----   Message from Franchot Gallo, MD sent at 03/09/2020  1:06 PM EDT ----- Please let pt know he has a stone on the right side that is just above the bladder. I would suggest that we can take care of that at the same time as his recurrent bladder tumor--I will give you a green sheet. ----- Message ----- From: Dorisann Frames, RN Sent: 02/27/2020  11:12 AM EDT To: Franchot Gallo, MD  Please review

## 2020-03-09 NOTE — Telephone Encounter (Signed)
K

## 2020-03-09 NOTE — Telephone Encounter (Signed)
Pt called again for CT results.

## 2020-03-09 NOTE — Telephone Encounter (Signed)
Pt notified of results. Pt states he is willing to have surgery done at [facility] if no time is available at [facility]. Once posting sheet received will work on getting surgery scheduled.

## 2020-03-10 NOTE — Telephone Encounter (Signed)
Posting sheet, demographic, insurance and office notes faxed to [facility] scheduler. Pt wishes to have done at [facility].

## 2020-03-16 ENCOUNTER — Telehealth: Payer: Self-pay | Admitting: Cardiology

## 2020-03-16 ENCOUNTER — Other Ambulatory Visit: Payer: Self-pay | Admitting: Urology

## 2020-03-16 ENCOUNTER — Telehealth: Payer: Self-pay

## 2020-03-16 NOTE — Telephone Encounter (Signed)
Pharmacy, please comment on how long patient can hold Eliquis for upcoming procedure?  Thank you! 

## 2020-03-16 NOTE — Telephone Encounter (Signed)
New Message      [facility] Pre-operative Risk Assessment    Request for surgical clearance:  1. What type of surgery is being performed? For a uretero-resectoscope of a  kidney stone and a transurethral resection of a bladder tumor   2. When is this surgery scheduled? 03/29/2020   3. What type of clearance is required (medical clearance vs. Pharmacy clearance to hold med vs. Both)? Both  4. Are there any medications that need to be held prior to surgery and how long? Eliquis held for 48 hours    5. Practice name and name of physician performing surgery? [facility], Dr. Remo Lipps [place]   6. What is your office phone number [phone] ext [phone]    7.   What is your office fax number [phone]  8.   Anesthesia type (None, local, MAC, general) ? General    Avaletta L Williams 03/16/2020, 11:07 AM  _________________________________________________________________   (provider comments below)

## 2020-03-16 NOTE — Telephone Encounter (Signed)
1 month post op appt scheduled with pt for May 11th.

## 2020-03-16 NOTE — Telephone Encounter (Signed)
Patient with diagnosis of afib on Eliquis for anticoagulation.    Procedure: For a uretero-resectoscope of a kidney stone and a transurethral resection of a bladder tumor   Date of procedure: 03/29/20  CHADS2-VASc score of  5 (AGE, stroke/tia x 2, AGE, male)  CrCl 52 ml/min  Due to patient history of stroke, I will defer to Dr. Marlou Porch.

## 2020-03-16 NOTE — Telephone Encounter (Signed)
   Primary Cardiologist: Candee Furbish, MD  Chart reviewed as part of pre-operative protocol coverage. Patient was last seen by Dr. Marlou Porch in 10/2019 at which time he was doing well from a cardiac standpoint. Patient was contacted today for further pre-op evaluation and reports he has done well since last visit. He has some chronic shortness of breath with activity (history of COPD) as well as some chronic lower extremity edema but sounds like these are stable. He denies any significant worsening of either of these. No chest pain, shortness of breath at rest, orthopnea, PND. He does note some dizziness with quick position changes but states he has had this for years. No palpitations or syncope. Able to complete >4.0 METS.   Given past medical history and time since last visit, based on ACC/AHA guidelines, Kyle Owen would be at acceptable risk for the planned procedure without further cardiovascular testing.   Per Dr. Marlou Porch: "Given the overall bleeding risks associated with this procedure, I would recommend holding the Eliquis for 2 days prior to procedure and resuming hopefully 24 hours post procedure to minimize stroke risks."  I will route this recommendation to the requesting party via Epic fax function and remove from pre-op pool.  Please call with questions.  [name], PA-C 03/16/2020, 3:27 PM

## 2020-03-16 NOTE — Telephone Encounter (Signed)
I think given the overall bleeding risks associated with this procedure, I would recommend holding the Eliquis for 2 days prior to procedure and resuming hopefully 24 hours post procedure to minimize stroke risks. Candee Furbish, MD

## 2020-03-22 ENCOUNTER — Encounter (HOSPITAL_COMMUNITY): Payer: Self-pay

## 2020-03-22 NOTE — Progress Notes (Signed)
PCP - Asencion Noble Cardiologist - Dr. Nils Pyle  lov 09-17-19 epic and  Candee Furbish lov [id number]  Clearance note Sande Rives PA-C 03-16-20 epic  Chest x-ray - CT abd pelvis 02-27-20 epic  CT chest 09-17-19 epic EKG - 11-24-19 epic Stress Test -  ECHO -  Cardiac Cath -   Sleep Study -  CPAP -   Fasting Blood Sugar -  Checks Blood Sugar _____ times a day  Blood Thinner Instructions:Elequis hold 2 days prior Aspirin Instructions: Last Dose:  Anesthesia review: a fib, elequis, thoracic aortic  Aneurysm. Hx  Cryptogenic stroke , loop recorder battery not working COPD,   Patient denies shortness of breath, fever, cough and chest pain at PAT appointment  NONE   Patient verbalized understanding of instructions that were given to them at the PAT appointment. Patient was also instructed that they will need to review over the PAT instructions again at home before surgery.

## 2020-03-22 NOTE — Patient Instructions (Signed)
DUE TO COVID-19 ONLY ONE VISITOR IS ALLOWED TO COME WITH YOU AND STAY IN THE WAITING ROOM ONLY DURING PRE OP AND PROCEDURE DAY OF SURGERY. THE 1 VISITOR MAY VISIT WITH YOU AFTER SURGERY IN YOUR PRIVATE ROOM DURING VISITING HOURS ONLY!  YOU NEED TO HAVE A COVID 19 TEST ON_4-1-21______ @_______ , THIS TEST MUST BE DONE BEFORE SURGERY, COME  [place], [address] , [id number].  ([place]) ONCE YOUR COVID TEST IS COMPLETED, PLEASE BEGIN THE QUARANTINE INSTRUCTIONS AS OUTLINED IN YOUR HANDOUT.                KETIH CORDILL  03/22/2020   Your procedure is scheduled on: 03-29-20   Report to [facility] Main  Entrance   Report to admitting at      0730  AM     Call this number if you have problems the morning of surgery [phone]    Remember: Do not eat food or drink liquids :After Midnight.  BRUSH YOUR TEETH MORNING OF SURGERY AND RINSE YOUR MOUTH OUT, NO CHEWING GUM CANDY OR MINTS.     Take these medicines the morning of surgery with A SIP OF WATER: verapamil, inhalers bring them with you, zoloft, finasteride, lipitor                                You may not have any metal on your body including hair pins and              piercings  Do not wear jewelry,lotions, powders or perfumes, deodorant                     Men may shave face and neck.   Do not bring valuables to the hospital. [place].  Contacts, dentures or bridgework may not be worn into surgery.      Patients discharged the day of surgery will not be allowed to drive home. IF YOU ARE HAVING SURGERY AND GOING HOME THE SAME DAY, YOU MUST HAVE AN ADULT TO DRIVE YOU HOME AND BE WITH YOU FOR 24 HOURS. YOU MAY GO HOME BY TAXI OR UBER OR ORTHERWISE, BUT AN ADULT MUST ACCOMPANY YOU HOME AND STAY WITH YOU FOR 24 HOURS.  Name and phone number of your driver:  Special Instructions: N/A              Please read over the following fact sheets you were  given: _____________________________________________________________________             [facility] - Preparing for Surgery Before surgery, you can play an important role.  Because skin is not sterile, your skin needs to be as free of germs as possible.  You can reduce the number of germs on your skin by washing with CHG (chlorahexidine gluconate) soap before surgery.  CHG is an antiseptic cleaner which kills germs and bonds with the skin to continue killing germs even after washing. Please DO NOT use if you have an allergy to CHG or antibacterial soaps.  If your skin becomes reddened/irritated stop using the CHG and inform your nurse when you arrive at Short Stay. Do not shave (including legs and underarms) for at least 48 hours prior to the first CHG shower.  You may shave your face/neck. Please follow  these instructions carefully:  1.  Shower with CHG Soap the night before surgery and the  morning of Surgery.  2.  If you choose to wash your hair, wash your hair first as usual with your  normal  shampoo.  3.  After you shampoo, rinse your hair and body thoroughly to remove the  shampoo.                           4.  Use CHG as you would any other liquid soap.  You can apply chg directly  to the skin and wash                       Gently with a scrungie or clean washcloth.  5.  Apply the CHG Soap to your body ONLY FROM THE NECK DOWN.   Do not use on face/ open                           Wound or open sores. Avoid contact with eyes, ears mouth and genitals (private parts).                       Wash face,  Genitals (private parts) with your normal soap.             6.  Wash thoroughly, paying special attention to the area where your surgery  will be performed.  7.  Thoroughly rinse your body with warm water from the neck down.  8.  DO NOT shower/wash with your normal soap after using and rinsing off  the CHG Soap.                9.  Pat yourself dry with a clean towel.            10.  Wear  clean pajamas.            11.  Place clean sheets on your bed the night of your first shower and do not  sleep with pets. Day of Surgery : Do not apply any lotions/deodorants the morning of surgery.  Please wear clean clothes to the hospital/surgery center.  FAILURE TO FOLLOW THESE INSTRUCTIONS MAY RESULT IN THE CANCELLATION OF YOUR SURGERY PATIENT SIGNATURE_________________________________  NURSE SIGNATURE__________________________________  ________________________________________________________________________

## 2020-03-23 ENCOUNTER — Other Ambulatory Visit: Payer: Self-pay

## 2020-03-23 ENCOUNTER — Encounter (HOSPITAL_COMMUNITY)
Admission: RE | Admit: 2020-03-23 | Discharge: 2020-03-23 | Disposition: A | Discharge status: Home / Self Care | Payer: Medicare Other | Source: Ambulatory Visit | Attending: Urology | Admitting: Urology

## 2020-03-23 ENCOUNTER — Encounter (HOSPITAL_COMMUNITY): Payer: Self-pay

## 2020-03-23 DIAGNOSIS — Z01812 Encounter for preprocedural laboratory examination: Secondary | ICD-10-CM | POA: Diagnosis not present

## 2020-03-23 HISTORY — DX: Personal history of urinary calculi: Z87.442

## 2020-03-23 HISTORY — DX: Other specified postprocedural states: Z98.890

## 2020-03-23 HISTORY — DX: Other complications of anesthesia, initial encounter: T88.59XA

## 2020-03-23 LAB — CBC
HCT: 43.2 % (ref 39.0–52.0)
Hemoglobin: 13.3 g/dL (ref 13.0–17.0)
MCH: 31.7 pg (ref 26.0–34.0)
MCHC: 30.8 g/dL (ref 30.0–36.0)
MCV: 103.1 fL — ABNORMAL HIGH (ref 80.0–100.0)
Platelets: 149 10*3/uL — ABNORMAL LOW (ref 150–400)
RBC: 4.19 MIL/uL — ABNORMAL LOW (ref 4.22–5.81)
RDW: 13.1 % (ref 11.5–15.5)
WBC: 4.7 10*3/uL (ref 4.0–10.5)
nRBC: 0 % (ref 0.0–0.2)

## 2020-03-23 LAB — BASIC METABOLIC PANEL
Anion gap: 7 (ref 5–15)
BUN: 27 mg/dL — ABNORMAL HIGH (ref 8–23)
CO2: 29 mmol/L (ref 22–32)
Calcium: 8.7 mg/dL — ABNORMAL LOW (ref 8.9–10.3)
Chloride: 108 mmol/L (ref 98–111)
Creatinine, Ser: 1.42 mg/dL — ABNORMAL HIGH (ref 0.61–1.24)
GFR calc Af Amer: 54 mL/min — ABNORMAL LOW (ref >60)
GFR calc non Af Amer: 47 mL/min — ABNORMAL LOW (ref >60)
Glucose, Bld: 97 mg/dL (ref 70–99)
Potassium: 4.2 mmol/L (ref 3.5–5.1)
Sodium: 144 mmol/L (ref 135–145)

## 2020-03-24 DIAGNOSIS — J449 Chronic obstructive pulmonary disease, unspecified: Secondary | ICD-10-CM | POA: Diagnosis not present

## 2020-03-24 DIAGNOSIS — Z79899 Other long term (current) drug therapy: Secondary | ICD-10-CM | POA: Diagnosis not present

## 2020-03-24 DIAGNOSIS — D090 Carcinoma in situ of bladder: Secondary | ICD-10-CM | POA: Diagnosis not present

## 2020-03-24 DIAGNOSIS — I480 Paroxysmal atrial fibrillation: Secondary | ICD-10-CM | POA: Diagnosis not present

## 2020-03-24 NOTE — Anesthesia Preprocedure Evaluation (Addendum)
Anesthesia Evaluation  Patient identified by MRN, date of birth, ID band Patient awake    Reviewed: Allergy & Precautions, H&P , NPO status , [name] Chart, lab work & pertinent test results, reviewed documented beta blocker date and time   History of Anesthesia Complications (+) history of anesthetic complications  Airway Mallampati: II  TM Distance: >3 FB Neck ROM: full    Dental no notable dental hx. (+) Poor Dentition, Chipped, Missing, Caps,    Pulmonary COPD, former smoker,    Pulmonary exam normal breath sounds clear to auscultation       Cardiovascular Exercise Tolerance: Good + dysrhythmias Atrial Fibrillation  Rhythm:regular Rate:Normal  EKG: 10/28/2019 Rate 69 bpm  Normal sinus rhythm   CV: Myocardial Perfusion 06/08/2017 Nuclear stress EF: 66%. There was no ST segment deviation noted during stress. The study is normal. This is a low risk study. The left ventricular ejection fraction is hyperdynamic (>65%).  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 Bladder cancer TIA   GI/Hepatic negative GI ROS, Neg liver ROS,   Endo/Other  negative endocrine ROS  Renal/GU Renal disease  negative genitourinary   Musculoskeletal  (+) Arthritis , Osteoarthritis,    Abdominal   Peds  Hematology negative hematology ROS (+)    Anesthesia Other Findings   Reproductive/Obstetrics negative OB ROS                           Anesthesia Physical Anesthesia Plan  ASA: III  Anesthesia Plan: General   Post-op Pain Management:    Induction: Intravenous  PONV Risk Score and Plan: 2 and Ondansetron  Airway Management Planned: LMA and Oral ETT  Additional Equipment:   Intra-op Plan:   Post-operative Plan: Extubation in OR  Informed Consent: I have reviewed the patients History and Physical, chart, labs and discussed the procedure including the risks, benefits and alternatives for the proposed anesthesia with the patient or authorized representative who has indicated his/her understanding and acceptance.     Dental Advisory Given  Plan Discussed with: CRNA  Anesthesia Plan Comments: (See PAT note 03/23/2020, Konrad Felix, PA-C)       Anesthesia Quick Evaluation

## 2020-03-24 NOTE — Progress Notes (Addendum)
Anesthesia Chart Review   Case: [id number] Date/Time: 03/29/20 0915   Procedures:      CYSTOSCOPY/RETROGRADE/URETEROSCOPY/STONE EXTRACTION WITH BASKET (Right ) - 1 HR     HOLMIUM LASER APPLICATION (Right )     TRANSURETHRAL RESECTION OF BLADDER TUMOR (TURBT) (N/A )   Anesthesia type: General   Pre-op diagnosis: BLADDER CANCER AND KIDNEY STONE   Location: [place] / [facility]   Surgeons: Franchot Gallo, MD      DISCUSSION:78 y.o. former smoker (45 pack years, quit 09/26/2010) with h/o TIA, PAF (on Eliquis), thoracic aortic aneurysm (4.7 cm stable, followed by Dr. Darcey Nora), COPD, HLD, bladder cancer, kidney stone scheduled for above procedure 03/29/2020 with Dr. Franchot Gallo.   Pt cleared by cardiology 03/16/2020.  Per Sande Rives, PA-C, "Chart reviewed as part of pre-operative protocol coverage. Patient was last seen by Dr. Marlou Porch in 10/2019 at which time he was doing well from a cardiac standpoint. Patient was contacted today for further pre-op evaluation and reports he has done well since last visit. He has some chronic shortness of breath with activity (history of COPD) as well as some chronic lower extremity edema but sounds like these are stable. He denies any significant worsening of either of these. No chest pain, shortness of breath at rest, orthopnea, PND. He does note some dizziness with quick position changes but states he has had this for years. No palpitations or syncope. Able to complete >4.0 METS.  Given past medical history and time since last visit, based on ACC/AHA guidelines, Kyle Owen would be at acceptable risk for the planned procedure without further cardiovascular testing.  Per Dr. Marlou Porch: "Given the overall bleeding risks associated with this procedure, I would recommend holding the Eliquis for 2 days prior to procedure and resuming hopefully 24 hours post procedure to minimize stroke risks.""  Pt reports he becomes combative after anesthesia in  recovery.   Anticipate pt can proceed with planned procedure barring acute status change.   VS: BP 125/74   Pulse 72   Temp 36.6 C (Oral)   Resp 18   Ht 6\' 1"  (1.854 m)   Wt 97.2 kg   SpO2 94%   BMI 28.27 kg/m   PROVIDERS: Asencion Noble, MD is PCP   Candee Furbish, MD is Cardiologist  LABS: Labs reviewed: Acceptable for surgery. (all labs ordered are listed, but only abnormal results are displayed)  Labs Reviewed - No data to display   IMAGES:   EKG: 10/28/2019 Rate 69 bpm  Normal sinus rhythm   CV: Myocardial Perfusion 06/08/2017   Nuclear stress EF: 66%.  There was no ST segment deviation noted during stress.  The study is normal.  This is a low risk study.  The left ventricular ejection fraction is hyperdynamic (>65%).   Thinning of the inferior wall at mid and basal level thought to be from extra cardiac /diaphragmatic attenuation No ischemia and no RWMA;s EF 66  Echo 07/27/2015 Study Conclusions   - Left ventricle: Systolic function was normal. The estimated  ejection fraction was in the range of 55% to 60%. Wall motion was  normal; there were no regional wall motion abnormalities.  - Aortic valve: No evidence of vegetation. There was mild  regurgitation.  - Ascending aorta: The ascending aorta was mildly dilated.  - Mitral valve: No evidence of vegetation. There was mild  regurgitation.  - Left atrium: No evidence of thrombus in the atrial cavity or  appendage.  - Right atrium: No  evidence of thrombus in the atrial cavity or  appendage.  - Atrial septum: No defect or patent foramen ovale was identified.  - Tricuspid valve: No evidence of vegetation. There was  mild-moderate regurgitation.  - Pulmonic valve: No evidence of vegetation.   Impressions:   - Normal LV function; mild AI and MR; mild to moderate TR; mildly  dilated ascending aorta (4.2 cm); mild to moderate  atherosclerosis descending aorta; negative saline  microcavitation  study.  Past Medical History:  Diagnosis Date  . Arthritis    DJD right knee  . Benign prostatic hypertrophy with urinary frequency   . Bladder cancer ([place])   . Complication of anesthesia    gets combative in recovery  . COPD with emphysema ([place])   . Depression   . Dyspnea    when walking  . Dysrhythmia    a fib  . History of colon polyps   . History of kidney stones   . History of loop recorder    loop recorder in place battery is dead has not had changed due to covid  . History of pneumonia    hx Recurrent -- last bout Dec 2016 CAP-- per pt resolved  . 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 Hyperlipidemia   . 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 Nephrolithiasis    right non-obstructive  per CT 05-02-2016  . Ocular migraine    controlled w/ verapamil  . PAF (paroxysmal atrial fibrillation) ([place])    followed by AFIB clinic-- cardiologist-- dr Marlou Porch  . Pneumonia    history of pnu.  . Pulmonary nodule, left    left lower lobe x2 per CT 01-20-2016  . Renal cyst, left   . Thoracic aortic aneurysm without rupture ([place])    ascending -- ct chest 01/10/16  4.8cm  . Wears hearing aid    bilateral-- wear at times    Past Surgical History:  Procedure Laterality Date  . ANTERIOR CERVICAL DECOMP/DISCECTOMY FUSION N/A 04/03/2017   Procedure: Cervical three-four, Cervical four-five Anterior cervical decompression/discectomy/fusion;  Surgeon: Erline Levine, MD;  Location: [place];  Service: Neurosurgery;  Laterality: N/A;  . CATARACT EXTRACTION W/ INTRAOCULAR LENS IMPLANT Right 2016  . CATARACT EXTRACTION W/PHACO  12/16/2012   Procedure: CATARACT EXTRACTION PHACO AND INTRAOCULAR  LENS PLACEMENT (IOC);  Surgeon: Tonny Branch, MD;  Location: [facility];  Service: Ophthalmology;  Laterality: Left;  CDE:17.31  . COLONOSCOPY  10/03/2011   Procedure: COLONOSCOPY;  Surgeon: Jamesetta So;  Location: [facility];  Service: Gastroenterology;  Laterality: N/A;  . DECOMPRESSIOIN ULNAR NERVE AND CUBITAL TUNNEL RELEASE Left 07-14-2009   elbow  . 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 INGUINAL HERNIA REPAIR Right 1990's  . KNEE ARTHROSCOPY Right 2015  . LEFT CUBITAL TUNNEL RELEASE    . POSTERIOR LUMBAR FUSION  2014   X2  . SHOULDER ARTHROSCOPY Left 1990's  . TEE WITHOUT CARDIOVERSION N/A 07/27/2015   Procedure: TRANSESOPHAGEAL ECHOCARDIOGRAM (TEE);  Surgeon: Lelon Perla, MD;  Location: [facility] END[facility]COPY;  Service: Cardiovascular;  Laterality: N/A;   normal LV function, ef 55-60%,  mild AR and MR, mild dilated ascending aorta (4.2cm),  mild to moderate atherosclerosis  descending aorta,  mild to moderate TR,  negative saline microcavitation study  . THYROIDECTOMY Right 01/20/2015   Procedure: RIGHT THYROIDECTOMY;  Surgeon: Ascencion Dike, MD;  Location: [place];  Service: ENT;  Laterality: Right;  . TONSILLECTOMY  as child  . TRANSURETHRAL RESECTION OF BLADDER TUMOR N/A 05/15/2016   Procedure: TRANSURETHRAL RESECTION OF BLADDER TUMOR (TURBT) AND INSTILLATION OF EPIRUBICIN;  Surgeon: Franchot Gallo, MD;  Location: [facility];  Service: Urology;  Laterality: N/A;    MEDICATIONS: . acetaminophen (TYLENOL) 500 MG tablet  . atorvastatin (LIPITOR) 20 MG tablet  . budesonide-formoterol (SYMBICORT) 160-4.5 MCG/ACT inhaler  . ELIQUIS 5 MG TABS tablet  . finasteride (PROSCAR) 5 MG tablet  . Magnesium 250 MG TABS  . OVER THE COUNTER MEDICATION  . sertraline (ZOLOFT) 50 MG tablet  . VENTOLIN HFA 108 (90 BASE) MCG/ACT inhaler  . verapamil (CALAN-SR) 240 MG CR tablet   No current facility-administered medications for this  encounter.    Maia Plan [facility] Pre-Surgical Testing [phone] 03/24/20  11:33 AM

## 2020-03-25 ENCOUNTER — Other Ambulatory Visit (HOSPITAL_COMMUNITY)
Admission: RE | Admit: 2020-03-25 | Discharge: 2020-03-25 | Disposition: A | Discharge status: Home / Self Care | Payer: Medicare Other | Source: Ambulatory Visit | Attending: Urology | Admitting: Urology

## 2020-03-25 ENCOUNTER — Other Ambulatory Visit: Payer: Self-pay

## 2020-03-25 DIAGNOSIS — Z01812 Encounter for preprocedural laboratory examination: Secondary | ICD-10-CM | POA: Insufficient documentation

## 2020-03-25 DIAGNOSIS — Z20822 Contact with and (suspected) exposure to covid-19: Secondary | ICD-10-CM | POA: Diagnosis not present

## 2020-03-26 LAB — SARS CORONAVIRUS 2 (TAT 6-24 HRS): SARS Coronavirus 2: NEGATIVE

## 2020-03-28 NOTE — H&P (Signed)
H&P  Chief Complaint: Kidney stone, bladder cancer  History of Present Illness: 79 yo male presents for cysto, R RGP, URS w/ stone extraction, TURBT for recurrent bladder cancer as well as a rt distal ureteral stone.  Past Medical History:  Diagnosis Date  . Arthritis    DJD right knee  . Benign prostatic hypertrophy with urinary frequency   . Bladder cancer ([place])   . Complication of anesthesia    gets combative in recovery  . COPD with emphysema ([place])   . Depression   . Dyspnea    when walking  . Dysrhythmia    a fib  . History of colon polyps   . History of kidney stones   . History of loop recorder    loop recorder in place battery is dead has not had changed due to covid  . History of pneumonia    hx Recurrent -- last bout Dec 2016 CAP-- per pt resolved  . 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 Hyperlipidemia   . 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 Nephrolithiasis    right non-obstructive  per CT 05-02-2016  . Ocular migraine    controlled w/ verapamil  . PAF (paroxysmal atrial fibrillation) ([place])    followed by AFIB clinic-- cardiologist-- dr Marlou Porch  . Pneumonia    history of pnu.  . Pulmonary nodule, left    left lower lobe x2 per CT 01-20-2016  . Renal cyst, left   . Thoracic aortic aneurysm without rupture ([place])    ascending -- ct chest 01/10/16  4.8cm  . Wears hearing aid    bilateral-- wear at times    Past Surgical History:  Procedure Laterality Date  . ANTERIOR CERVICAL DECOMP/DISCECTOMY FUSION N/A 04/03/2017   Procedure: Cervical three-four, Cervical four-five Anterior cervical decompression/discectomy/fusion;  Surgeon: Erline Levine, MD;  Location: [facility];  Service:  Neurosurgery;  Laterality: N/A;  . CATARACT EXTRACTION W/ INTRAOCULAR LENS IMPLANT Right 2016  . CATARACT EXTRACTION W/PHACO  12/16/2012   Procedure: CATARACT EXTRACTION PHACO AND INTRAOCULAR LENS PLACEMENT (IOC);  Surgeon: Tonny Branch, MD;  Location: [facility];  Service: Ophthalmology;  Laterality: Left;  CDE:17.31  . COLONOSCOPY  10/03/2011   Procedure: COLONOSCOPY;  Surgeon: Jamesetta So;  [facility];  Service: Gastroenterology;  Laterality: N/A;  . DECOMPRESSIOIN ULNAR NERVE AND CUBITAL TUNNEL RELEASE Left 07-14-2009   elbow  . 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 INGUINAL HERNIA REPAIR Right 1990's  . KNEE ARTHROSCOPY Right 2015  . LEFT CUBITAL TUNNEL RELEASE    . POSTERIOR LUMBAR FUSION  2014   X2  . SHOULDER ARTHROSCOPY Left 1990's  . TEE WITHOUT CARDIOVERSION N/A 07/27/2015   Procedure: TRANSESOPHAGEAL ECHOCARDIOGRAM (TEE);  Surgeon: Lelon Perla, MD;  Location: [facility];  Service: Cardiovascular;  Laterality: N/A;   normal LV function, ef 55-60%,  mild AR and MR, mild dilated ascending aorta (4.2cm),  mild to moderate atherosclerosis  descending aorta,  mild to moderate TR,  negative saline microcavitation study  . THYROIDECTOMY Right 01/20/2015   Procedure: RIGHT THYROIDECTOMY;  Surgeon: Ascencion Dike, MD;  Location: [facility];  Service: ENT;  Laterality: Right;  . TONSILLECTOMY  as child  . TRANSURETHRAL RESECTION OF BLADDER TUMOR N/A 05/15/2016   Procedure: TRANSURETHRAL RESECTION OF BLADDER TUMOR (TURBT) AND INSTILLATION OF EPIRUBICIN;  Surgeon: Franchot Gallo, MD;  Location: [facility];  Service: Urology;  Laterality: N/A;    Home Medications:  Allergies as of 03/28/2020      Reactions   Flomax [tamsulosin Hcl] Nausea And Vomiting, Other (See Comments)   "Pt thought he was dying " DIZZY   Tamsulosin Nausea Only      Medication List    Notice   Cannot display discharge medications  because the patient has not yet been admitted.     Allergies:  Allergies  Allergen Reactions  . Flomax [Tamsulosin Hcl] Nausea And Vomiting and Other (See Comments)    "Pt thought he was dying " DIZZY  . Tamsulosin Nausea Only    Family History  Problem Relation Age of Onset  . Stroke Sister   . Breast cancer Sister   . Stroke Brother     Social History:  reports that he quit smoking about 9 years ago. His smoking use included cigarettes. He has a 45.00 pack-year smoking history. He has never used smokeless tobacco. He reports that he does not drink alcohol or use drugs.  ROS: A complete review of systems was performed.  All systems are negative except for pertinent findings as noted.  Physical Exam:  Vital signs in last 24 hours: There were no vitals taken for this visit. Constitutional:  Alert and oriented, No acute distress Cardiovascular: Regular rate  Respiratory: Normal respiratory effort GI: Abdomen is soft, nontender, nondistended, no abdominal masses. No CVAT.  Genitourinary: Normal male phallus, testes are descended bilaterally and non-tender and without masses, scrotum is normal in appearance without lesions or masses, perineum is normal on inspection. Lymphatic: No lymphadenopathy Neurologic: Grossly intact, no focal deficits Psychiatric: Normal mood and affect  Laboratory Data:  No results for input(s): WBC, HGB, HCT, PLT in the last 72 hours.  No results for input(s): NA, K, CL, GLUCOSE, BUN, CALCIUM, CREATININE in the last 72 hours.  Invalid input(s): CO3   No results found for this or any previous visit (from the past 24 hour(s)). Recent Results (from the past 240 hour(s))  SARS CORONAVIRUS 2 (TAT 6-24 HRS) Nasopharyngeal Nasopharyngeal Swab     Status: None   Collection Time: 03/25/20  6:29 AM   Specimen: Nasopharyngeal Swab  Result Value Ref Range Status   SARS Coronavirus 2 NEGATIVE NEGATIVE Final    Comment: (NOTE) SARS-CoV-2 target nucleic  acids are NOT DETECTED. The SARS-CoV-2 RNA is generally detectable in upper and lower respiratory specimens during the acute phase of infection. Negative results do not preclude SARS-CoV-2 infection, do not rule out co-infections with other pathogens, and should not be used as the sole basis for treatment or other patient management decisions. Negative results must be combined with clinical observations, patient history, and epidemiological information. The expected result is Negative. Fact Sheet for Patients: SugarRoll.be Fact Sheet for Healthcare Providers: https://www.woods-mathews.com/ This test is not yet approved or cleared by the [place] FDA and  has been authorized for detection and/or diagnosis of SARS-CoV-2 by FDA under an Emergency Use Authorization (EUA). This EUA will remain  in effect (meaning this test can be used) for the duration of the COVID-19 declaration under Section 56 4(b)(1) of the Act, 21 U.S.C. section 360bbb-3(b)(1), unless the authorization is terminated or revoked sooner. Performed at [facility], [address] [place],  [place] [address]     Renal Function: Recent Labs    03/23/20 1001  CREATININE 1.42*   Estimated Creatinine Clearance: 51.8 mL/min (A) (by C-G formula based on SCr of 1.42 mg/dL (H)).  Radiologic Imaging: No results found.  Impression/Assessment:  Recurrent bladder cancer, Rt distal ureteral stone  Plan:  Cysto, R RGP, R URS, possible HLL/stone extraction, TURBT

## 2020-03-29 ENCOUNTER — Other Ambulatory Visit: Payer: Self-pay

## 2020-03-29 ENCOUNTER — Encounter (HOSPITAL_COMMUNITY): Payer: Self-pay | Admitting: Urology

## 2020-03-29 ENCOUNTER — Ambulatory Visit (HOSPITAL_COMMUNITY): Payer: Medicare Other | Admitting: Physician Assistant

## 2020-03-29 ENCOUNTER — Ambulatory Visit (HOSPITAL_COMMUNITY): Payer: Medicare Other | Admitting: Anesthesiology

## 2020-03-29 ENCOUNTER — Encounter (HOSPITAL_COMMUNITY)
Admission: RE | Disposition: A | Discharge status: Home / Self Care | Payer: Self-pay | Source: Home / Self Care | Attending: Urology

## 2020-03-29 ENCOUNTER — Ambulatory Visit (HOSPITAL_COMMUNITY)
Admission: RE | Admit: 2020-03-29 | Discharge: 2020-03-29 | Disposition: A | Discharge status: Home / Self Care | Payer: Medicare Other | Attending: Urology | Admitting: Urology

## 2020-03-29 ENCOUNTER — Ambulatory Visit (HOSPITAL_COMMUNITY): Payer: Medicare Other

## 2020-03-29 DIAGNOSIS — C678 Malignant neoplasm of overlapping sites of bladder: Secondary | ICD-10-CM | POA: Diagnosis not present

## 2020-03-29 DIAGNOSIS — Z87891 Personal history of nicotine dependence: Secondary | ICD-10-CM | POA: Insufficient documentation

## 2020-03-29 DIAGNOSIS — N201 Calculus of ureter: Secondary | ICD-10-CM | POA: Diagnosis not present

## 2020-03-29 DIAGNOSIS — D414 Neoplasm of uncertain behavior of bladder: Secondary | ICD-10-CM | POA: Insufficient documentation

## 2020-03-29 DIAGNOSIS — Z20822 Contact with and (suspected) exposure to covid-19: Secondary | ICD-10-CM | POA: Insufficient documentation

## 2020-03-29 DIAGNOSIS — E785 Hyperlipidemia, unspecified: Secondary | ICD-10-CM | POA: Insufficient documentation

## 2020-03-29 DIAGNOSIS — Z8673 Personal history of transient ischemic attack (TIA), and cerebral infarction without residual deficits: Secondary | ICD-10-CM | POA: Diagnosis not present

## 2020-03-29 DIAGNOSIS — Z981 Arthrodesis status: Secondary | ICD-10-CM | POA: Insufficient documentation

## 2020-03-29 DIAGNOSIS — C679 Malignant neoplasm of bladder, unspecified: Secondary | ICD-10-CM | POA: Diagnosis not present

## 2020-03-29 DIAGNOSIS — J439 Emphysema, unspecified: Secondary | ICD-10-CM | POA: Diagnosis not present

## 2020-03-29 DIAGNOSIS — I712 Thoracic aortic aneurysm, without rupture: Secondary | ICD-10-CM | POA: Diagnosis not present

## 2020-03-29 DIAGNOSIS — N200 Calculus of kidney: Secondary | ICD-10-CM | POA: Diagnosis not present

## 2020-03-29 DIAGNOSIS — I480 Paroxysmal atrial fibrillation: Secondary | ICD-10-CM | POA: Diagnosis not present

## 2020-03-29 DIAGNOSIS — D303 Benign neoplasm of bladder: Secondary | ICD-10-CM | POA: Diagnosis not present

## 2020-03-29 HISTORY — PX: CYSTOSCOPY/RETROGRADE/URETEROSCOPY/STONE EXTRACTION WITH BASKET: SHX5317

## 2020-03-29 HISTORY — PX: HOLMIUM LASER APPLICATION: SHX5852

## 2020-03-29 HISTORY — PX: TRANSURETHRAL RESECTION OF BLADDER TUMOR: SHX2575

## 2020-03-29 SURGERY — CYSTOSCOPY, WITH CALCULUS REMOVAL USING BASKET
Anesthesia: General | Laterality: Right

## 2020-03-29 MED ORDER — PHENYLEPHRINE 40 MCG/ML (10ML) SYRINGE FOR IV PUSH (FOR BLOOD PRESSURE SUPPORT)
PREFILLED_SYRINGE | INTRAVENOUS | Status: DC | PRN
  Administered 2020-03-29: 40 ug via INTRAVENOUS
  Administered 2020-03-29: 80 ug via INTRAVENOUS
  Administered 2020-03-29 (×7): 40 ug via INTRAVENOUS

## 2020-03-29 MED ORDER — FENTANYL CITRATE (PF) 100 MCG/2ML IJ SOLN: 25.0000 ug | INTRAMUSCULAR | Status: DC | PRN

## 2020-03-29 MED ORDER — LIDOCAINE 2% (20 MG/ML) 5 ML SYRINGE
INTRAMUSCULAR | Status: AC
  Filled 2020-03-29: qty 5

## 2020-03-29 MED ORDER — SODIUM CHLORIDE 0.9 % IR SOLN
Status: DC | PRN
  Administered 2020-03-29: 6000 mL

## 2020-03-29 MED ORDER — ONDANSETRON HCL 4 MG/2ML IJ SOLN
INTRAMUSCULAR | Status: DC | PRN
  Administered 2020-03-29: 4 mg via INTRAVENOUS

## 2020-03-29 MED ORDER — FENTANYL CITRATE (PF) 100 MCG/2ML IJ SOLN
INTRAMUSCULAR | Status: AC
  Filled 2020-03-29: qty 2

## 2020-03-29 MED ORDER — MEPERIDINE HCL 50 MG/ML IJ SOLN: 6.2500 mg | INTRAMUSCULAR | Status: DC | PRN

## 2020-03-29 MED ORDER — ACETAMINOPHEN 160 MG/5ML PO SOLN: 325.0000 mg | ORAL | Status: DC | PRN

## 2020-03-29 MED ORDER — LACTATED RINGERS IV SOLN: INTRAVENOUS | Status: DC

## 2020-03-29 MED ORDER — ONDANSETRON HCL 4 MG/2ML IJ SOLN
INTRAMUSCULAR | Status: AC
  Filled 2020-03-29: qty 2

## 2020-03-29 MED ORDER — PROPOFOL 10 MG/ML IV BOLUS
INTRAVENOUS | Status: AC
  Filled 2020-03-29: qty 20

## 2020-03-29 MED ORDER — ACETAMINOPHEN 325 MG PO TABS: 325.0000 mg | ORAL_TABLET | ORAL | Status: DC | PRN

## 2020-03-29 MED ORDER — DEXAMETHASONE SODIUM PHOSPHATE 10 MG/ML IJ SOLN
INTRAMUSCULAR | Status: AC
  Filled 2020-03-29: qty 1

## 2020-03-29 MED ORDER — IOHEXOL 300 MG/ML  SOLN
INTRAMUSCULAR | Status: DC | PRN
  Administered 2020-03-29: 25 mL

## 2020-03-29 MED ORDER — CEPHALEXIN 500 MG PO CAPS: 500.0000 mg | ORAL_CAPSULE | Freq: Two times a day (BID) | ORAL | 0 refills | Status: DC

## 2020-03-29 MED ORDER — DEXAMETHASONE SODIUM PHOSPHATE 10 MG/ML IJ SOLN
INTRAMUSCULAR | Status: DC | PRN
  Administered 2020-03-29: 5 mg via INTRAVENOUS

## 2020-03-29 MED ORDER — OXYCODONE HCL 5 MG/5ML PO SOLN: 5.0000 mg | Freq: Once | ORAL | Status: DC | PRN

## 2020-03-29 MED ORDER — DEXMEDETOMIDINE HCL IN NACL 200 MCG/50ML IV SOLN
INTRAVENOUS | Status: AC
  Filled 2020-03-29: qty 50

## 2020-03-29 MED ORDER — ONDANSETRON HCL 4 MG/2ML IJ SOLN: 4.0000 mg | Freq: Once | INTRAMUSCULAR | Status: DC | PRN

## 2020-03-29 MED ORDER — FENTANYL CITRATE (PF) 100 MCG/2ML IJ SOLN
INTRAMUSCULAR | Status: DC | PRN
  Administered 2020-03-29: 25 ug via INTRAVENOUS
  Administered 2020-03-29: 50 ug via INTRAVENOUS
  Administered 2020-03-29: 25 ug via INTRAVENOUS

## 2020-03-29 MED ORDER — OXYCODONE HCL 5 MG PO TABS: 5.0000 mg | ORAL_TABLET | Freq: Once | ORAL | Status: DC | PRN

## 2020-03-29 MED ORDER — EPHEDRINE SULFATE-NACL 50-0.9 MG/10ML-% IV SOSY
PREFILLED_SYRINGE | INTRAVENOUS | Status: DC | PRN
  Administered 2020-03-29 (×4): 5 mg via INTRAVENOUS
  Administered 2020-03-29 (×3): 10 mg via INTRAVENOUS

## 2020-03-29 MED ORDER — CEFAZOLIN SODIUM-DEXTROSE 2-4 GM/100ML-% IV SOLN
2.0000 g | INTRAVENOUS | Status: AC
  Administered 2020-03-29: 2 g via INTRAVENOUS
  Filled 2020-03-29: qty 100

## 2020-03-29 MED ORDER — 0.9 % SODIUM CHLORIDE (POUR BTL) OPTIME
TOPICAL | Status: DC | PRN
  Administered 2020-03-29: 1000 mL

## 2020-03-29 SURGICAL SUPPLY — 37 items
BAG DRN RND TRDRP ANRFLXCHMBR (UROLOGICAL SUPPLIES)
BAG URINE DRAIN 2000ML AR STRL (UROLOGICAL SUPPLIES) IMPLANT
BAG URO CATCHER STRL LF (MISCELLANEOUS) ×3 IMPLANT
BASKET LASER NITINOL 1.9FR (BASKET) ×2 IMPLANT
BASKET STONE 1.7 NGAGE (UROLOGICAL SUPPLIES) ×1 IMPLANT
BASKET ZERO TIP NITINOL 2.4FR (BASKET) IMPLANT
BSKT STON RTRVL 120 1.9FR (BASKET)
BSKT STON RTRVL ZERO TP 2.4FR (BASKET)
CATH FOLEY 2WAY SLVR 30CC 24FR (CATHETERS) IMPLANT
CATH INTERMIT  6FR 70CM (CATHETERS) ×1 IMPLANT
CLOTH BEACON ORANGE TIMEOUT ST (SAFETY) ×3 IMPLANT
COVER SURGICAL LIGHT HANDLE (MISCELLANEOUS) ×2 IMPLANT
COVER WAND RF STERILE (DRAPES) IMPLANT
ELECT REM PT RETURN 15FT ADLT (MISCELLANEOUS) ×2 IMPLANT
EVACUATOR MICROVAS BLADDER (UROLOGICAL SUPPLIES) IMPLANT
FIBER LASER FLEXIVA 365 (UROLOGICAL SUPPLIES) IMPLANT
FIBER LASER TRAC TIP (UROLOGICAL SUPPLIES) ×1 IMPLANT
GLOVE BIOGEL M 8.0 STRL (GLOVE) ×3 IMPLANT
GOWN STRL REUS W/TWL XL LVL3 (GOWN DISPOSABLE) ×3 IMPLANT
GUIDEWIRE ANG ZIPWIRE 038X150 (WIRE) ×2 IMPLANT
GUIDEWIRE STR DUAL SENSOR (WIRE) ×3 IMPLANT
IV NS 1000ML (IV SOLUTION)
IV NS 1000ML BAXH (IV SOLUTION) ×2 IMPLANT
KIT TURNOVER KIT A (KITS) IMPLANT
LOOP MONOPOLAR YLW (ELECTROSURGICAL) IMPLANT
MANIFOLD NEPTUNE II (INSTRUMENTS) ×3 IMPLANT
NDL SAFETY ECLIPSE 18X1.5 (NEEDLE) ×2 IMPLANT
NEEDLE HYPO 18GX1.5 SHARP (NEEDLE) ×3
PACK CYSTO (CUSTOM PROCEDURE TRAY) ×3 IMPLANT
PENCIL SMOKE EVACUATOR (MISCELLANEOUS) IMPLANT
SHEATH URETERAL 12FRX28CM (UROLOGICAL SUPPLIES) ×1 IMPLANT
STENT URET 6FRX28 CONTOUR (STENTS) ×1 IMPLANT
SYR TOOMEY IRRIG 70ML (MISCELLANEOUS)
SYRINGE TOOMEY IRRIG 70ML (MISCELLANEOUS) IMPLANT
TUBING CONNECTING 10 (TUBING) ×3 IMPLANT
TUBING UROLOGY SET (TUBING) ×3 IMPLANT
WATER STERILE IRR 3000ML UROMA (IV SOLUTION) ×2 IMPLANT

## 2020-03-29 NOTE — Op Note (Signed)
Preoperative diagnosis: Right distal ureteral stone, recurrent bladder cancer  Postoperative diagnosis: Same  Principal procedure: Cystoscopy, TURBT of 10 mm bladder cancer, right retrograde ureteropyelogram, fluoroscopic interpretation, right ureteroscopy, holmium laser and extraction of right ureteral calculus, placement of 6 French by 28 cm contour double-J stent with tether  Surgeon: Kaylynn Chamblin  Anesthesia: General  Complications: None  Specimen: Bladder tumor, for pathology  Estimated blood loss: Less than 10 mL  Drains: 28 cm x 6 French contour double-J stent with tether  Indications: 79 year old male with recurrent bladder tumor around his right ureteral orifice.  Upon evaluation for gross hematuria he was found to have a moderate sized right distal ureteral stone without hydronephrosis.  I recommended cystoscopy, TURBT as well as ureteroscopic management of the stone.  The patient is aware of the procedure, risks and complications and desires to proceed.  Findings: Urethra was normal, prostate nonobstructive.  Bladder was inspected circumferentially.  Ureteral orifice ease were normal in configuration location.  No significant trabeculations.  3 small papillary lesions around the right ureteral orifice.  Retrograde ureteropyelogram performed with 6 [place] open-ended catheter and Omnipaque revealed a filling defect in the right distal ureter.  There was no significant columnization of contrast proximal to this.  No other filling defects or strictures were noted within the ureter.  Pyelocalyceal system was normal.  Description of procedure: The patient was properly identified and marked in the holding area.  He received preoperative IV antibiotics.  Was taken to the operating room where general anesthetic was administered.  He was placed in the dorsolithotomy position.  Genitalia and perineum were prepped and draped.  Proper timeout was performed.  21 French pan endoscope advanced into  the bladder with the above-mentioned findings.  Retrograde ureteropyelogram performed using Omnipaque and 6 [place] open ended catheter.  The above-mentioned findings were noted.  Guidewire advanced through the open-ended catheter with a curl seen in the upper pole calyceal system.  Cystoscope and open-ended catheter were then removed.  Distal ureter dilated first with the obturator then the entire 12/14 ureteral access catheter.  6 French dual-lumen semirigid ureteroscope was then advanced directly up to the stone which was seen.  Was too large to be removed intact.  200 m fiber was then utilized to apply laser energy, 15 Hz, 0.8 J, to the stone.  Stone was fragmented into multiple pieces, grasped with the engage basket and the fragments deposited in the bladder.  Inspection of the entire ureter with the scope revealed no further stone matter.  I then remove the ureteroscope.  Cystoscope was then passed, and I placed a 28 cm, 6 French contour double-J stent with the tether left on.  The tether was then brought out through the urethra.  Cystoscope was then passed again and using the cold cup biopsy forceps several small bites were taken of the papillary tumors around the right ureteral orifice.  These were sent for pathology labeled "bladder tumor".  I then used the Bugbee electrode to cauterize the small biopsied sites.  Hemostasis was adequate.  I did not think a Foley catheter needed to be placed.  The scope was removed.  Bladder had been drained.  The tether was then tied in a knot, trimmed, and taped to the patient's penis.  At this point, the procedure was terminated.  The patient was awakened, taken to the PACU in stable condition, having tolerated the procedure well.

## 2020-03-29 NOTE — Interval H&P Note (Signed)
History and Physical Interval Note:  03/29/2020 9:04 AM  Kyle Owen  has presented today for surgery, with the diagnosis of BLADDER CANCER AND KIDNEY STONE.  The various methods of treatment have been discussed with the patient and family. After consideration of risks, benefits and other options for treatment, the patient has consented to  Procedure(s) with comments: CYSTOSCOPY/RETROGRADE/URETEROSCOPY/STONE EXTRACTION WITH BASKET (Right) - 1 HR HOLMIUM LASER APPLICATION (Right) TRANSURETHRAL RESECTION OF BLADDER TUMOR (TURBT) (N/A) as a surgical intervention.  The patient's history has been reviewed, patient examined, no change in status, stable for surgery.  I have reviewed the patient's chart and labs.  Questions were answered to the patient's satisfaction.     Lillette Boxer Imonie Tuch

## 2020-03-29 NOTE — Transfer of Care (Signed)
Immediate Anesthesia Transfer of Care Note  Patient: Kyle Owen  Procedure(s) Performed: CYSTOSCOPY/RETROGRADE/URETEROSCOPY/STONE EXTRACTION WITH BASKET (Right ) HOLMIUM LASER APPLICATION (Right ) TRANSURETHRAL RESECTION OF BLADDER TUMOR (TURBT) (N/A )  Patient Location: PACU  Anesthesia Type:General  Level of Consciousness: drowsy  Airway & Oxygen Therapy: Patient Spontanous Breathing and Patient connected to face mask oxygen  Post-op Assessment: Report given to RN and Post -op Vital signs reviewed and stable  Post vital signs: Reviewed and stable  Last Vitals:  Vitals Value Taken Time  BP 111/59 03/29/20 1015  Temp    Pulse 62 03/29/20 1017  Resp 11 03/29/20 1017  SpO2 97 % 03/29/20 1017  Vitals shown include unvalidated device data.  Last Pain:  Vitals:   03/29/20 0805  TempSrc:   PainSc: 0-No pain      Patients Stated Pain Goal: 4 ([id number])  Complications: No apparent anesthesia complications

## 2020-03-29 NOTE — Anesthesia Postprocedure Evaluation (Signed)
Anesthesia Post Note  Patient: Kyle Owen  Procedure(s) Performed: CYSTOSCOPY/RETROGRADE/URETEROSCOPY/STONE EXTRACTION WITH BASKET (Right ) HOLMIUM LASER APPLICATION (Right ) TRANSURETHRAL RESECTION OF BLADDER TUMOR (TURBT) (N/A )     Patient location during evaluation: PACU Anesthesia Type: General Level of consciousness: awake and alert Pain management: pain level controlled Vital Signs Assessment: post-procedure vital signs reviewed and stable Respiratory status: spontaneous breathing, nonlabored ventilation, respiratory function stable and patient connected to nasal cannula oxygen Cardiovascular status: blood pressure returned to baseline and stable Postop Assessment: no apparent nausea or vomiting Anesthetic complications: no    Last Vitals:  Vitals:   03/29/20 1045 03/29/20 1111  BP: 114/66 114/67  Pulse: 66 69  Resp: 15 18  Temp: (!) 36.4 C 36.4 C  SpO2: 92% 91%    Last Pain:  Vitals:   03/29/20 1111  TempSrc: Axillary  PainSc:                  Chozen Latulippe

## 2020-03-29 NOTE — Discharge Instructions (Signed)
1. You may see some blood in the urine and may have some burning with urination for 48-72 hours. You also may notice that you have to urinate more frequently or urgently after your procedure which is normal.  2. You should call should you develop an inability urinate, fever > 101, persistent nausea and vomiting that prevents you from eating or drinking to stay hydrated.  3. If you have a stent, you will likely urinate more frequently and urgently until the stent is removed and you may experience some discomfort/pain in the lower abdomen and flank especially when urinating. You may take pain medication prescribed to you if needed for pain. You may also intermittently have blood in the urine until the stent is removed.  It is okay to pull the string to remove the stent on Thursday morning. Strain your urine, save stone fragments 4. If you have a catheter, you will be taught how to take care of the catheter by the nursing staff prior to discharge from the hospital.  You may periodically feel a strong urge to void with the catheter in place.  This is a bladder spasm and most often can occur when having a bowel movement or moving around. It is typically self-limited and usually will stop after a few minutes.  You may use some Vaseline or Neosporin around the tip of the catheter to reduce friction at the tip of the penis. You may also see some blood in the urine.  A very small amount of blood can make the urine look quite red.  As long as the catheter is draining well, there usually is not a problem.  However, if the catheter is not draining well and is bloody, you should call the office [phone]) to notify us.

## 2020-03-29 NOTE — Anesthesia Procedure Notes (Signed)
Procedure Name: LMA Insertion Date/Time: 03/29/2020 9:20 AM Performed by: Gwyndolyn Saxon, CRNA Pre-anesthesia Checklist: Patient identified, Emergency Drugs available, Suction available and Patient being monitored Patient Re-evaluated:Patient Re-evaluated prior to induction Oxygen Delivery Method: Circle system utilized Preoxygenation: Pre-oxygenation with 100% oxygen Induction Type: IV induction Ventilation: Mask ventilation without difficulty and Oral airway inserted - appropriate to patient size LMA: LMA inserted LMA Size: 4.0 Number of attempts: 1 Airway Equipment and Method: Patient positioned with wedge pillow (pt states comfort with neck position) Placement Confirmation: positive ETCO2 and breath sounds checked- equal and bilateral Tube secured with: Tape Dental Injury: Teeth and Oropharynx as per pre-operative assessment

## 2020-03-30 LAB — SURGICAL PATHOLOGY

## 2020-03-31 DIAGNOSIS — N183 Chronic kidney disease, stage 3 unspecified: Secondary | ICD-10-CM | POA: Diagnosis not present

## 2020-03-31 DIAGNOSIS — I480 Paroxysmal atrial fibrillation: Secondary | ICD-10-CM | POA: Diagnosis not present

## 2020-03-31 DIAGNOSIS — D696 Thrombocytopenia, unspecified: Secondary | ICD-10-CM | POA: Diagnosis not present

## 2020-03-31 DIAGNOSIS — C679 Malignant neoplasm of bladder, unspecified: Secondary | ICD-10-CM | POA: Diagnosis not present

## 2020-04-02 ENCOUNTER — Telehealth: Payer: Self-pay | Admitting: Urology

## 2020-04-02 NOTE — Telephone Encounter (Signed)
Patients wife called and states pt is complaining of passing blood. He had a procedure on 03/29/20. The stent has been taken out per pt wife. She is not sure if the passing of blood is normal. Requests a nurse return the call.

## 2020-04-02 NOTE — Telephone Encounter (Signed)
Returned call and spoke with Mr. Pellicano pertaining to wifes concern from prior phone call. Pt. made aware that the bleeding is normal and can vary from length of time but it should began to subside in a few days. Pt. Voiced understanding. Writer informed pt. to call office for anymore questions or concerns.

## 2020-04-06 ENCOUNTER — Telehealth: Payer: Self-pay

## 2020-04-06 NOTE — Telephone Encounter (Signed)
I talked with Dr. Diona Fanti about pts  prior call stating his bleeding had let up some from his procedure on 4/5 but not his penis was swollen red and hurting. Dr. Diona Fanti said to give him the choice to come to [facility] and be seen or come in tomorrow and be seen by Dr. Alyson Ingles. We made appt. For 2 tomorrow.

## 2020-04-06 NOTE — Telephone Encounter (Signed)
Pt. Called saying he had dark red colored blleding most of the week but the last 2 days it has been a dark tannish color and the bleeding seems to be better. The last 2 days he has noticed that his penis has been swollen and sore and very red. He thinks it may be getting infected.

## 2020-04-07 ENCOUNTER — Encounter: Payer: Self-pay | Admitting: Urology

## 2020-04-07 ENCOUNTER — Other Ambulatory Visit: Payer: Self-pay

## 2020-04-07 ENCOUNTER — Ambulatory Visit (INDEPENDENT_AMBULATORY_CARE_PROVIDER_SITE_OTHER): Payer: Medicare Other | Admitting: Urology

## 2020-04-07 VITALS — BP 122/68 | HR 80 | Temp 97.9°F | Ht 73.0 in | Wt 209.0 lb

## 2020-04-07 DIAGNOSIS — N481 Balanitis: Secondary | ICD-10-CM | POA: Diagnosis not present

## 2020-04-07 DIAGNOSIS — N4889 Other specified disorders of penis: Secondary | ICD-10-CM

## 2020-04-07 MED ORDER — CLOTRIMAZOLE-BETAMETHASONE 1-0.05 % EX CREA: 1.0000 "application " | TOPICAL_CREAM | Freq: Two times a day (BID) | CUTANEOUS | 3 refills | Status: DC

## 2020-04-07 NOTE — Progress Notes (Signed)

## 2020-04-07 NOTE — Patient Instructions (Signed)
Balanitis  Balanitis is swelling and irritation (inflammation) of the head of the penis (glans penis). The condition may also cause inflammation of the skin around the glans penis (foreskin) in men who have not been circumcised. It may develop because of an infection or another medical condition. Balanitis occurs most often among men who have not had their foreskin removed (uncircumcised men). Balanitis sometimes causes scarring of the penis or foreskin, which can require surgery. Untreated balanitis can increase the risk of penile cancer. What are the causes? Common causes of this condition include:  Poor personal hygiene, especially in uncircumcised men. Not cleaning the glans penis and foreskin well can result in buildup of bacteria, viruses, and yeast, which can lead to infection and inflammation.  Irritation and lack of air flow due to fluid (smegma) that can build up on the glans penis. Other causes include:  Chemical irritation from products such as soaps or shower gels (especially those that have fragrance), condoms, personal lubricants, petroleum jelly, spermicides, or fabric softeners.  Skin conditions, such as eczema, dermatitis, and psoriasis.  Allergies to medicines, such as tetracycline and sulfa drugs.  Certain medical conditions, including liver cirrhosis, congestive heart failure, diabetes, and kidney disease.  Infections, such as candidiasis, HPV (human papillomavirus), herpes simplex, gonorrhea, and syphilis.  Severe obesity. What increases the risk? The following factors may make you more likely to develop this condition:  Having diabetes. This is the most common risk factor.  Having a tight foreskin that is difficult to pull back (retract) past the glans.  Having sexual intercourse without using a condom. What are the signs or symptoms? Symptoms of this condition include:  Discharge from under the foreskin.  A bad smell.  Pain or difficulty retracting the  foreskin.  Tenderness, redness, and swelling of the glans.  A rash or sores on the glans or foreskin.  Itchiness.  Inability to get an erection due to pain.  Difficulty urinating.  Scarring of the penis or foreskin, in some cases. How is this diagnosed? This condition may be diagnosed based on:  A physical exam.  Testing a swab of discharge to check for bacterial or fungal infection.  Blood tests: ? To check for viruses that can cause balanitis. ? To check your blood sugar (glucose) level. High blood glucose could be a sign of diabetes, which can cause balanitis. How is this treated? Treatment for balanitis depends on the cause. Treatment may include:  Improving personal hygiene. Your health care provider may recommend sitting in a bath of warm water that is deep enough to cover your hips and buttocks (sitz bath).  Medicines such as: ? Creams or ointments to reduce swelling (steroids) or to treat an infection. ? Antibiotic medicine. ? Antifungal medicine.  Surgery to remove or cut the foreskin (circumcision). This may be done if you have scarring on the foreskin that makes it difficult to retract.  Controlling other medical problems that may be causing your condition or making it worse. Follow these instructions at home:  Do not have sex until the condition clears up, or until your health care provider approves.  Keep your penis clean and dry. Take sitz baths as recommended by your health care provider.  Avoid products that irritate your skin or make symptoms worse, such as soaps and shower gels that have fragrance.  Take over-the-counter and prescription medicines only as told by your health care provider. ? If you were prescribed an antibiotic medicine or a cream or ointment, use it as   told by your health care provider. Do not stop using your medicine, cream, or ointment even if you start to feel better. ? Do not drive or use heavy machinery while taking prescription  pain medicine. Contact a health care provider if:  Your symptoms get worse or do not improve with home care.  You develop chills or a fever.  You have trouble urinating.  You cannot retract your foreskin. Get help right away if:  You develop severe pain.  You are unable to urinate. Summary  Balanitis is inflammation of the head of the penis (glans penis) caused by irritation or infection.  Balanitis causes pain, redness, and swelling of the glans penis.  This condition is most common among uncircumcised men who do not keep their glans penis clean and in men who have diabetes.  Treatment may include creams or ointments.  Good hygiene is important for prevention. This includes pulling back the foreskin when washing your penis. This information is not intended to replace advice given to you by your health care provider. Make sure you discuss any questions you have with your health care provider. Document Revised: 11/23/2017 Document Reviewed: 10/30/2016 Elsevier Patient Education  2020 [employer]  

## 2020-04-07 NOTE — Progress Notes (Addendum)
04/07/2020 2:32 PM   Kyle Owen Aug 19, 1941 [id number]  Referring provider: Asencion Noble, MD [address] [place],  [place] [address]  Penile pain  HPI: Kyle Owen is a 79yo here for evaluation of penile pain. Pain started 3 days ago and he noted erythema on his glans and foreskin. He has been putting triple antibiotic ointment on the glans for 3 days which has failed to improve his pain and erythema. No LUTS. No other associated symptoms. No exacerbating/alleviaitng events   PMH: Past Medical History:  Diagnosis Date  . Arthritis    DJD right knee  . Benign prostatic hypertrophy with urinary frequency   . Bladder cancer ([place])   . Complication of anesthesia    gets combative in recovery  . COPD with emphysema ([place])   . Depression   . Dyspnea    when walking  . Dysrhythmia    a fib  . History of colon polyps   . History of kidney stones   . History of loop recorder    loop recorder in place battery is dead has not had changed due to covid  . History of pneumonia    hx Recurrent -- last bout Dec 2016 CAP-- per pt resolved  . 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 Hyperlipidemia   . 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 Nephrolithiasis    right non-obstructive  per CT 05-02-2016  . Ocular migraine    controlled w/ verapamil  . PAF (paroxysmal atrial fibrillation) ([place])    followed by AFIB clinic-- cardiologist-- dr Marlou Porch  . Pneumonia    history of pnu.  . Pulmonary nodule, left    left lower lobe x2 per CT 01-20-2016  . Renal cyst, left   . Thoracic aortic aneurysm without rupture ([place])    ascending -- ct chest 01/10/16  4.8cm  . Wears hearing aid    bilateral-- wear  at times    Surgical History: Past Surgical History:  Procedure Laterality Date  . ANTERIOR CERVICAL DECOMP/DISCECTOMY FUSION N/A 04/03/2017   Procedure: Cervical three-four, Cervical four-five Anterior cervical decompression/discectomy/fusion;  Surgeon: Erline Levine, MD;  Location: [facility];  Service: Neurosurgery;  Laterality: N/A;  . CATARACT EXTRACTION W/ INTRAOCULAR LENS IMPLANT Right 2016  . CATARACT EXTRACTION W/PHACO  12/16/2012   Procedure: CATARACT EXTRACTION PHACO AND INTRAOCULAR LENS PLACEMENT (IOC);  Surgeon: Tonny Branch, MD;  [facility];  Service: Ophthalmology;  Laterality: Left;  CDE:17.31  . COLONOSCOPY  10/03/2011   Procedure: COLONOSCOPY;  Surgeon: Jamesetta So;  [facility];  Service: Gastroenterology;  Laterality: N/A;  . CYSTOSCOPY/RETROGRADE/URETEROSCOPY/STONE EXTRACTION WITH BASKET Right 03/29/2020   Procedure: CYSTOSCOPY/RETROGRADE/URETEROSCOPY/STONE EXTRACTION WITH BASKET;  Surgeon: Franchot Gallo, MD;  [facility];  Service: Urology;  Laterality: Right;  1 HR  . DECOMPRESSIOIN ULNAR NERVE AND CUBITAL TUNNEL RELEASE Left 07-14-2009   elbow  . 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 HOLMIUM LASER APPLICATION Right [id number]   Procedure: HOLMIUM LASER APPLICATION;  Surgeon: Franchot Gallo, MD;  [facility];  Service: Urology;  Laterality: Right;  . INGUINAL HERNIA REPAIR Right 1990's  . KNEE ARTHROSCOPY Right 2015  . LEFT CUBITAL  TUNNEL RELEASE    . POSTERIOR LUMBAR FUSION  2014   X2  . SHOULDER ARTHROSCOPY Left 1990's  . TEE WITHOUT CARDIOVERSION N/A 07/27/2015   Procedure: TRANSESOPHAGEAL ECHOCARDIOGRAM (TEE);  Surgeon: Lelon Perla, MD;  Location: [facility];  Service: Cardiovascular;  Laterality: N/A;   normal LV function, ef 55-60%,  mild AR and Kyle, mild dilated ascending aorta (4.2cm),  mild to moderate atherosclerosis  descending aorta,  mild to moderate TR,  negative  saline microcavitation study  . THYROIDECTOMY Right 01/20/2015   Procedure: RIGHT THYROIDECTOMY;  Surgeon: Ascencion Dike, MD;  Location: [facility];  Service: ENT;  Laterality: Right;  . TONSILLECTOMY  as child  . TRANSURETHRAL RESECTION OF BLADDER TUMOR N/A 05/15/2016   Procedure: TRANSURETHRAL RESECTION OF BLADDER TUMOR (TURBT) AND INSTILLATION OF EPIRUBICIN;  Surgeon: Franchot Gallo, MD;  Location[facility];  Service: Urology;  Laterality: N/A;  . TRANSURETHRAL RESECTION OF BLADDER TUMOR N/A 03/29/2020   Procedure: TRANSURETHRAL RESECTION OF BLADDER TUMOR (TURBT);  Surgeon: Franchot Gallo, MD;  [facility];  Service: Urology;  Laterality: N/A;    Home Medications:  Allergies as of 04/07/2020      Reactions   Flomax [tamsulosin Hcl] Nausea And Vomiting, Other (See Comments)   "Pt thought he was dying " DIZZY   Fentanyl    Makes me combative    Tamsulosin Nausea Only      Medication List       Accurate as of April 07, 2020  2:32 PM. If you have any questions, ask your nurse or doctor.        acetaminophen 500 MG tablet Commonly known as: TYLENOL Take 500 mg by mouth daily.   atorvastatin 20 MG tablet Commonly known as: LIPITOR TAKE 1 TABLET BY MOUTH DAILY. What changed: how much to take   budesonide-formoterol 160-4.5 MCG/ACT inhaler Commonly known as: SYMBICORT Inhale 2 puffs into the lungs 2 (two) times daily.   cephALEXin 500 MG capsule Commonly known as: Keflex Take 1 capsule (500 mg total) by mouth 2 (two) times daily.   Eliquis 5 MG Tabs tablet Generic drug: apixaban TAKE (1) TABLET BY MOUTH TWICE DAILY. What changed: See the new instructions.   finasteride 5 MG tablet Commonly known as: PROSCAR Take 5 mg by mouth daily.   Magnesium 250 MG Tabs Take 500 mg by mouth daily.   OVER THE COUNTER MEDICATION Apply 1 application topically daily as needed (Joint pain).   sertraline 50 MG tablet Commonly known as: ZOLOFT Take 50 mg by mouth  every morning.   Ventolin HFA 108 (90 Base) MCG/ACT inhaler Generic drug: albuterol Inhale 2 puffs into the lungs Every 4 hours as needed for wheezing or shortness of breath. Reported on 12/23/2015   verapamil 240 MG CR tablet Commonly known as: CALAN-SR Take 240 mg by mouth daily.       Allergies:  Allergies  Allergen Reactions  . Flomax [Tamsulosin Hcl] Nausea And Vomiting and Other (See Comments)    "Pt thought he was dying " DIZZY  . Fentanyl     Makes me combative   . Tamsulosin Nausea Only    Family History: Family History  Problem Relation Age of Onset  . Stroke Sister   . Breast cancer Sister   . Stroke Brother     Social History:  reports that he quit smoking about 9 years ago. His smoking use included cigarettes. He has a 45.00 pack-year smoking history. He has never used  smokeless tobacco. He reports that he does not drink alcohol or use drugs.  ROS: All other review of systems were reviewed and are negative except what is noted above in HPI  Physical Exam: BP 122/68   Pulse 80   Temp 97.9 F (36.6 C)   Ht 6\' 1"  (1.854 m)   Wt 209 lb (94.8 kg)   BMI 27.57 kg/m   Constitutional:  Alert and oriented, No acute distress. HEENT: [place] AT, moist mucus membranes.  Trachea midline, no masses. Cardiovascular: No clubbing, cyanosis, or edema. Respiratory: Normal respiratory effort, no increased work of breathing. GI: Abdomen is soft, nontender, nondistended, no abdominal masses GU: No CVA tenderness. uncircumcised phallus. Balanitis present No masses/lesions on penis, testis, scrotum.   Lymph: No cervical or inguinal lymphadenopathy. Skin: No rashes, bruises or suspicious lesions. Neurologic: Grossly intact, no focal deficits, moving all 4 extremities. Psychiatric: Normal mood and affect.  Laboratory Data: Lab Results  Component Value Date   WBC 4.7 03/23/2020   HGB 13.3 03/23/2020   HCT 43.2 03/23/2020   MCV 103.1 (H) 03/23/2020   PLT 149 (L) 03/23/2020      Lab Results  Component Value Date   CREATININE 1.42 (H) 03/23/2020    No results found for: PSA  No results found for: TESTOSTERONE  Lab Results  Component Value Date   HGBA1C 6.0 (H) 06/09/2015    Urinalysis    Component Value Date/Time   COLORURINE YELLOW 06/13/2016 1120   APPEARANCEUR CLEAR 06/13/2016 1120   LABSPEC 1.020 06/13/2016 1120   PHURINE 5.5 06/13/2016 1120   GLUCOSEU NEGATIVE 06/13/2016 [place] 06/13/2016 1120   BILIRUBINUR pos 02/03/2020 1331   KETONESUR NEGATIVE 06/13/2016 1120   PROTEINUR Negative 02/03/2020 1331   PROTEINUR NEGATIVE 06/13/2016 1120   UROBILINOGEN negative (A) 02/03/2020 1331   NITRITE neg 02/03/2020 1331   NITRITE NEGATIVE 06/13/2016 1120   LEUKOCYTESUR Negative 02/03/2020 1331    No results found for: LABMICR, [place], RBCUA, LABEPIT, MUCUS, BACTERIA  Pertinent Imaging:  No results found for this or any previous visit. No results found for this or any previous visit. No results found for this or any previous visit. No results found for this or any previous visit. Results for orders placed during the hospital encounter of 01/20/16  US Renal   Narrative CLINICAL DATA:  Micro hematuria.  EXAM: RENAL / URINARY TRACT ULTRASOUND COMPLETE  COMPARISON:  CT 12/06/2015.  FINDINGS: Right Kidney:  Length: 10.9 cm. Cortical thinning and mild atrophy. No mass. No hydronephrosis.  Left Kidney:  Length: 12.4 cm. Echogenicity within normal limits. No solid mass or hydronephrosis visualized. 1.4 cm simple cyst left upper pole. 3.1 cm simple cyst midportion left kidney.  Bladder:  Appears normal for degree of bladder distention. Prostate is is prominent at 4.0 x 3.8 x 5.0 cm  IMPRESSION: 1.  Mild right renal atrophy.  2.  Simple cysts left kidney.  3. Prostate enlargement.   Electronically Signed   By: Marcello Moores  Register   On: 01/20/2016 16:27    No results found for this or any previous visit. No  results found for this or any previous visit. No results found for this or any previous visit.  Assessment & Plan:    1. Penile pain/ balanoposthitis clotrimazole cream BID for 21 days   No follow-ups on file.  Nicolette Bang, MD  [facility] [place]

## 2020-04-19 ENCOUNTER — Ambulatory Visit: Payer: Medicare Other | Admitting: Urology

## 2020-05-03 NOTE — Progress Notes (Signed)
H&P  Chief Complaint: Follow-up of recent bladder biopsy/right ureteroscopy  History of Present Illness: 4.5.20221-Underwent TURBT/Rt URS/stone mgmt.  Initial TURBT of a 2 cm papillary bladder tumor on 5.22.2017.  Pathology revealed papillary low-grade NMIBC. Epirubicin was placed post resection.  4.5.2021: He underwent cystoscopy, biopsy of papillary lesions at the right ureteral orifice, right ureteroscopy with holmium laser and extraction of stone as well as stent placement.  He has since remove the stent.  He is passed multiple small fragments.  He has had no recent issues other than a swollen penis following his anesthetic.  Perhaps this was secondary to Betadine administration.  This is since resolved.    Past Medical History:  Diagnosis Date  . Arthritis    DJD right knee  . Benign prostatic hypertrophy with urinary frequency   . Bladder cancer ([place])   . Complication of anesthesia    gets combative in recovery  . COPD with emphysema ([place])   . Depression   . Dyspnea    when walking  . Dysrhythmia    a fib  . History of colon polyps   . History of kidney stones   . History of loop recorder    loop recorder in place battery is dead has not had changed due to covid  . History of pneumonia    hx Recurrent -- last bout Dec 2016 CAP-- per pt resolved  . 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 Hyperlipidemia   . 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 Nephrolithiasis    right non-obstructive  per CT 05-02-2016  . Ocular migraine    controlled w/ verapamil  . PAF (paroxysmal atrial fibrillation) ([place])    followed by AFIB clinic-- cardiologist-- dr Marlou Porch  . Pneumonia    history of pnu.  .  Pulmonary nodule, left    left lower lobe x2 per CT 01-20-2016  . Renal cyst, left   . Thoracic aortic aneurysm without rupture ([place])    ascending -- ct chest 01/10/16  4.8cm  . Wears hearing aid    bilateral-- wear at times    Past Surgical History:  Procedure Laterality Date  . ANTERIOR CERVICAL DECOMP/DISCECTOMY FUSION N/A 04/03/2017   Procedure: Cervical three-four, Cervical four-five Anterior cervical decompression/discectomy/fusion;  Surgeon: Erline Levine, MD;  Location: [place];  Service: Neurosurgery;  Laterality: N/A;  . CATARACT EXTRACTION W/ INTRAOCULAR LENS IMPLANT Right 2016  . CATARACT EXTRACTION W/PHACO  12/16/2012   Procedure: CATARACT EXTRACTION PHACO AND INTRAOCULAR LENS PLACEMENT (IOC);  Surgeon: Tonny Branch, MD;  [facility];  Service: Ophthalmology;  Laterality: Left;  CDE:17.31  . COLONOSCOPY  10/03/2011   Procedure: COLONOSCOPY;  Surgeon: Jamesetta So;  [facility];  Service: Gastroenterology;  Laterality: N/A;  . CYSTOSCOPY/RETROGRADE/URETEROSCOPY/STONE EXTRACTION WITH BASKET Right 03/29/2020   Procedure: CYSTOSCOPY/RETROGRADE/URETEROSCOPY/STONE EXTRACTION WITH BASKET;  Surgeon: Franchot Gallo, MD;  [facility];  Service: Urology;  Laterality: Right;  1 HR  . DECOMPRESSIOIN ULNAR NERVE AND CUBITAL TUNNEL RELEASE Left 07-14-2009   elbow  . 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 HOLMIUM LASER APPLICATION Right [id number]   Procedure: HOLMIUM LASER APPLICATION;  Surgeon: Franchot Gallo, MD;  Location: [facility];  Service: Urology;  Laterality: Right;  . INGUINAL HERNIA REPAIR Right 1990's  . KNEE ARTHROSCOPY Right 2015  . LEFT CUBITAL TUNNEL RELEASE    . POSTERIOR LUMBAR FUSION  2014   X2  . SHOULDER ARTHROSCOPY Left 1990's  . TEE WITHOUT CARDIOVERSION N/A 07/27/2015   Procedure: TRANSESOPHAGEAL ECHOCARDIOGRAM (TEE);  Surgeon: Lelon Perla, MD;  Location: [facility];  Service:  Cardiovascular;  Laterality: N/A;   normal LV function, ef 55-60%,  mild AR and MR, mild dilated ascending aorta (4.2cm),  mild to moderate atherosclerosis  descending aorta,  mild to moderate TR,  negative saline microcavitation study  . THYROIDECTOMY Right 01/20/2015   Procedure: RIGHT THYROIDECTOMY;  Surgeon: Ascencion Dike, MD;  Location: [place];  Service: ENT;  Laterality: Right;  . TONSILLECTOMY  as child  . TRANSURETHRAL RESECTION OF BLADDER TUMOR N/A 05/15/2016   Procedure: TRANSURETHRAL RESECTION OF BLADDER TUMOR (TURBT) AND INSTILLATION OF EPIRUBICIN;  Surgeon: Franchot Gallo, MD;  Location: [facility];  Service: Urology;  Laterality: N/A;  . TRANSURETHRAL RESECTION OF BLADDER TUMOR N/A 03/29/2020   Procedure: TRANSURETHRAL RESECTION OF BLADDER TUMOR (TURBT);  Surgeon: Franchot Gallo, MD;  [facility];  Service: Urology;  Laterality: N/A;    Home Medications:  Allergies as of 05/04/2020      Reactions   Flomax [tamsulosin Hcl] Nausea And Vomiting, Other (See Comments)   "Pt thought he was dying " DIZZY   Fentanyl    Makes me combative    Tamsulosin Nausea Only      Medication List       Accurate as of May 03, 2020  8:44 PM. If you have any questions, ask your nurse or doctor.        acetaminophen 500 MG tablet Commonly known as: TYLENOL Take 500 mg by mouth daily.   atorvastatin 20 MG tablet Commonly known as: LIPITOR TAKE 1 TABLET BY MOUTH DAILY. What changed: how much to take   budesonide-formoterol 160-4.5 MCG/ACT inhaler Commonly known as: SYMBICORT Inhale 2 puffs into the lungs 2 (two) times daily.   cephALEXin 500 MG capsule Commonly known as: Keflex Take 1 capsule (500 mg total) by mouth 2 (two) times daily.   clotrimazole-betamethasone cream Commonly known as: Lotrisone Apply 1 application topically 2 (two) times daily.   Eliquis 5 MG Tabs tablet Generic drug: apixaban TAKE (1) TABLET BY MOUTH TWICE DAILY. What changed: See the  new instructions.   finasteride 5 MG tablet Commonly known as: PROSCAR Take 5 mg by mouth daily.   Magnesium 250 MG Tabs Take 500 mg by mouth daily.   OVER THE COUNTER MEDICATION Apply 1 application topically daily as needed (Joint pain).   sertraline 50 MG tablet Commonly known as: ZOLOFT Take 50 mg by mouth every morning.   Ventolin HFA 108 (90 Base) MCG/ACT inhaler Generic drug: albuterol Inhale 2 puffs into the lungs Every 4 hours as needed for wheezing or shortness of breath. Reported on 12/23/2015   verapamil 240 MG CR tablet Commonly known as: CALAN-SR Take 240 mg by mouth daily.       Allergies:  Allergies  Allergen Reactions  . Flomax [Tamsulosin Hcl] Nausea And Vomiting and Other (See Comments)    "Pt thought he was dying " DIZZY  . Fentanyl     Makes me combative   . Tamsulosin Nausea Only    Family History  Problem Relation Age of Onset  . Stroke Sister   . Breast cancer  Sister   . Stroke Brother     Social History:  reports that he quit smoking about 9 years ago. His smoking use included cigarettes. He has a 45.00 pack-year smoking history. He has never used smokeless tobacco. He reports that he does not drink alcohol or use drugs.  ROS:     Physical Exam:  Vital signs in last 24 hours: There were no vitals taken for this visit. Constitutional:  Alert and oriented, No acute distress Cardiovascular: Regular rate  Respiratory: Normal respiratory effort Genitourinary: Phallus is uncircumcised.  Foreskin fine.  No lesions noted. Lymphatic: No lymphadenopathy Neurologic: Grossly intact, no focal deficits Psychiatric: Normal mood and affect  I have reviewed prior pt notes  I have reviewed notes from referring/previous physicians  I have reviewed urinalysis results  I reviewed biopsy results with the patient which revealed papilloma, no evidence of urothelial carcinoma.   Impression/Assessment:  History of bladder cancer, status post  recent biopsy at the time of right ureteroscopic stone extraction with benign pathology.  Status post management of right distal ureteral stone, doing well  Plan:  1.  I discussed stone prevention diet with him  2.  I will perform ultrasound in the next week or so and call with results  3.  I will see back in 1 year for cystoscopy.

## 2020-05-04 ENCOUNTER — Ambulatory Visit (INDEPENDENT_AMBULATORY_CARE_PROVIDER_SITE_OTHER): Payer: Medicare Other | Admitting: Urology

## 2020-05-04 ENCOUNTER — Encounter: Payer: Self-pay | Admitting: Urology

## 2020-05-04 ENCOUNTER — Other Ambulatory Visit: Payer: Self-pay

## 2020-05-04 VITALS — BP 138/80 | HR 64 | Temp 97.9°F | Ht 73.0 in | Wt 209.0 lb

## 2020-05-04 DIAGNOSIS — R3129 Other microscopic hematuria: Secondary | ICD-10-CM

## 2020-05-04 DIAGNOSIS — N200 Calculus of kidney: Secondary | ICD-10-CM

## 2020-05-04 LAB — POCT URINALYSIS DIPSTICK
Bilirubin, UA: NEGATIVE
Blood, UA: NEGATIVE
Glucose, UA: NEGATIVE
Ketones, UA: NEGATIVE
Leukocytes, UA: NEGATIVE
Nitrite, UA: NEGATIVE
Protein, UA: NEGATIVE
Spec Grav, UA: >=1.03 — AB
Urobilinogen, UA: NEGATIVE U/dL — AB
pH, UA: 5

## 2020-05-04 NOTE — Progress Notes (Signed)
See progress note.

## 2020-05-11 ENCOUNTER — Other Ambulatory Visit: Payer: Self-pay

## 2020-05-11 ENCOUNTER — Ambulatory Visit (HOSPITAL_COMMUNITY)
Admission: RE | Admit: 2020-05-11 | Discharge: 2020-05-11 | Disposition: A | Discharge status: Home / Self Care | Payer: Medicare Other | Source: Ambulatory Visit | Attending: Urology | Admitting: Urology

## 2020-05-11 DIAGNOSIS — N281 Cyst of kidney, acquired: Secondary | ICD-10-CM | POA: Diagnosis not present

## 2020-05-11 DIAGNOSIS — N200 Calculus of kidney: Secondary | ICD-10-CM | POA: Diagnosis not present

## 2020-07-06 ENCOUNTER — Other Ambulatory Visit: Payer: Medicare Other | Admitting: Urology

## 2020-07-26 DIAGNOSIS — Z79899 Other long term (current) drug therapy: Secondary | ICD-10-CM | POA: Diagnosis not present

## 2020-07-26 DIAGNOSIS — N183 Chronic kidney disease, stage 3 unspecified: Secondary | ICD-10-CM | POA: Diagnosis not present

## 2020-07-26 DIAGNOSIS — E785 Hyperlipidemia, unspecified: Secondary | ICD-10-CM | POA: Diagnosis not present

## 2020-07-26 DIAGNOSIS — G464 Cerebellar stroke syndrome: Secondary | ICD-10-CM | POA: Diagnosis not present

## 2020-07-26 DIAGNOSIS — I480 Paroxysmal atrial fibrillation: Secondary | ICD-10-CM | POA: Diagnosis not present

## 2020-08-02 DIAGNOSIS — N1832 Chronic kidney disease, stage 3b: Secondary | ICD-10-CM | POA: Diagnosis not present

## 2020-08-02 DIAGNOSIS — J449 Chronic obstructive pulmonary disease, unspecified: Secondary | ICD-10-CM | POA: Diagnosis not present

## 2020-08-02 DIAGNOSIS — D696 Thrombocytopenia, unspecified: Secondary | ICD-10-CM | POA: Diagnosis not present

## 2020-08-09 ENCOUNTER — Other Ambulatory Visit: Payer: Self-pay | Admitting: Internal Medicine

## 2020-08-09 ENCOUNTER — Other Ambulatory Visit (HOSPITAL_COMMUNITY): Payer: Self-pay | Admitting: Internal Medicine

## 2020-08-09 DIAGNOSIS — G44221 Chronic tension-type headache, intractable: Secondary | ICD-10-CM | POA: Diagnosis not present

## 2020-08-10 ENCOUNTER — Ambulatory Visit (HOSPITAL_COMMUNITY)
Admission: RE | Admit: 2020-08-10 | Discharge: 2020-08-10 | Disposition: A | Discharge status: Home / Self Care | Payer: Medicare Other | Source: Ambulatory Visit | Attending: Internal Medicine | Admitting: Internal Medicine

## 2020-08-10 ENCOUNTER — Other Ambulatory Visit (HOSPITAL_COMMUNITY): Payer: Self-pay | Admitting: Internal Medicine

## 2020-08-10 ENCOUNTER — Encounter (HOSPITAL_COMMUNITY): Payer: Self-pay

## 2020-08-10 ENCOUNTER — Other Ambulatory Visit: Payer: Self-pay

## 2020-08-10 DIAGNOSIS — G319 Degenerative disease of nervous system, unspecified: Secondary | ICD-10-CM | POA: Diagnosis not present

## 2020-08-10 DIAGNOSIS — G44221 Chronic tension-type headache, intractable: Secondary | ICD-10-CM

## 2020-08-10 DIAGNOSIS — I6782 Cerebral ischemia: Secondary | ICD-10-CM | POA: Diagnosis not present

## 2020-08-10 DIAGNOSIS — R519 Headache, unspecified: Secondary | ICD-10-CM | POA: Diagnosis not present

## 2020-09-13 DIAGNOSIS — M25552 Pain in left hip: Secondary | ICD-10-CM | POA: Diagnosis not present

## 2020-09-13 DIAGNOSIS — R51 Headache with orthostatic component, not elsewhere classified: Secondary | ICD-10-CM | POA: Diagnosis not present

## 2020-09-21 DIAGNOSIS — M1612 Unilateral primary osteoarthritis, left hip: Secondary | ICD-10-CM | POA: Diagnosis not present

## 2020-09-22 ENCOUNTER — Encounter: Payer: Self-pay | Admitting: Pulmonary Disease

## 2020-09-22 ENCOUNTER — Ambulatory Visit (INDEPENDENT_AMBULATORY_CARE_PROVIDER_SITE_OTHER): Payer: Medicare Other | Admitting: Pulmonary Disease

## 2020-09-22 ENCOUNTER — Other Ambulatory Visit: Payer: Self-pay

## 2020-09-22 VITALS — BP 148/86 | HR 62 | Temp 97.6°F | Ht 73.0 in | Wt 208.0 lb

## 2020-09-22 DIAGNOSIS — J449 Chronic obstructive pulmonary disease, unspecified: Secondary | ICD-10-CM | POA: Diagnosis not present

## 2020-09-22 DIAGNOSIS — J439 Emphysema, unspecified: Secondary | ICD-10-CM | POA: Insufficient documentation

## 2020-09-22 DIAGNOSIS — Z23 Encounter for immunization: Secondary | ICD-10-CM

## 2020-09-22 DIAGNOSIS — R0602 Shortness of breath: Secondary | ICD-10-CM

## 2020-09-22 NOTE — Progress Notes (Signed)
Subjective:    Patient ID: Kyle Owen, male    DOB: 10/02/1941, 79 y.o.   MRN: [id number]  HPI   Chief Complaint  Patient presents with  . Consult    both non-productive and productive cough with milky yellowish phlegm, shortness of breath with exertion   79 year old ex-smoker presents to establish care for management of COPD. He has recently been evaluated by orthopedics and the left hip arthroplasty is planned, he has not been given a date yet.  He is accompanied by his wife of 5 years Kyle Owen  PMH -cryptogenic TIA, atrial fibrillation on Eliquis, bladder cancer , thoracic aortic aneurysm  He reports increased dyspnea on exertion for the last 3 to 4 years.  He is dyspneic while walking back from his mailbox especially in the cold weather.  He reports frequent URIs and has been treated for 2-3 episodes of pneumonia in the last 5 years.  Reports minimal cough production in the mornings, denies wheezing. He has been taking Symbicort for many years and this is helpful.  He rarely uses albuterol MDI he smoked about 50 pack years before he quit at age 5 He tried an exercise program at the gym, is able to do 7 minutes on the elliptical but can hardly go 2 minutes on a treadmill   Significant tests/ events reviewed  CT chest 08/2019  4.7 cm ascending aortic aneurysm .  Stable calcified granuloma right apex  Past Medical History:  Diagnosis Date  . Arthritis    DJD right knee  . Benign prostatic hypertrophy with urinary frequency   . Bladder cancer ([place])   . Complication of anesthesia    gets combative in recovery  . COPD with emphysema ([place])   . Depression   . Dyspnea    when walking  . Dysrhythmia    a fib  . History of colon polyps   . History of kidney stones   . History of loop recorder    loop recorder in place battery is dead has not had changed due to covid  . History of pneumonia    hx Recurrent -- last bout Dec 2016 CAP-- per pt resolved  . History of TIA  (transient ischemic attack) no residual per pt   per neurologist note (dr Leta Baptist 11-08-2015) dx cryptogenic TIA versus arrhythia versus dysautonomia (right ophthalmic artery TIA and x3 posterior circulation TIAs  . Hyperlipidemia   . 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 Nephrolithiasis    right non-obstructive  per CT 05-02-2016  . Ocular migraine    controlled w/ verapamil  . PAF (paroxysmal atrial fibrillation) ([place])    followed by AFIB clinic-- cardiologist-- dr Marlou Porch  . Pneumonia    history of pnu.  . Pulmonary nodule, left    left lower lobe x2 per CT 01-20-2016  . Renal cyst, left   . Thoracic aortic aneurysm without rupture ([place])    ascending -- ct chest 01/10/16  4.8cm  . Wears hearing aid    bilateral-- wear at times   Past Surgical History:  Procedure Laterality Date  . ANTERIOR CERVICAL DECOMP/DISCECTOMY FUSION N/A 04/03/2017   Procedure: Cervical three-four, Cervical four-five Anterior cervical decompression/discectomy/fusion;  Surgeon: Erline Levine, MD;  Location: [place] [facility];  Service: Neurosurgery;  Laterality: N/A;  . CATARACT EXTRACTION W/ INTRAOCULAR LENS IMPLANT Right 2016  . CATARACT EXTRACTION W/PHACO  12/16/2012   Procedure: CATARACT EXTRACTION PHACO  AND INTRAOCULAR LENS PLACEMENT (IOC);  Surgeon: Tonny Branch, MD;  Location: [facility];  Service: Ophthalmology;  Laterality: Left;  CDE:17.31  . COLONOSCOPY  10/03/2011   Procedure: COLONOSCOPY;  Surgeon: Jamesetta So;  [facility];  Service: Gastroenterology;  Laterality: N/A;  . CYSTOSCOPY/RETROGRADE/URETEROSCOPY/STONE EXTRACTION WITH BASKET Right 03/29/2020   Procedure: CYSTOSCOPY/RETROGRADE/URETEROSCOPY/STONE EXTRACTION WITH BASKET;  Surgeon: Franchot Gallo, MD;  Location: [facility];  Service: Urology;  Laterality: Right;  1 HR  . DECOMPRESSIOIN ULNAR NERVE AND CUBITAL TUNNEL RELEASE Left 07-14-2009   elbow  .  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 HOLMIUM LASER APPLICATION Right 08/01/8675   Procedure: HOLMIUM LASER APPLICATION;  Surgeon: Franchot Gallo, MD;  Location: [facility];  Service: Urology;  Laterality: Right;  . INGUINAL HERNIA REPAIR Right 1990's  . KNEE ARTHROSCOPY Right 2015  . LEFT CUBITAL TUNNEL RELEASE    . POSTERIOR LUMBAR FUSION  2014   X2  . SHOULDER ARTHROSCOPY Left 1990's  . TEE WITHOUT CARDIOVERSION N/A 07/27/2015   Procedure: TRANSESOPHAGEAL ECHOCARDIOGRAM (TEE);  Surgeon: Lelon Perla, MD;  Location: [facility];  Service: Cardiovascular;  Laterality: N/A;   normal LV function, ef 55-60%,  mild AR and MR, mild dilated ascending aorta (4.2cm),  mild to moderate atherosclerosis  descending aorta,  mild to moderate TR,  negative saline microcavitation study  . THYROIDECTOMY Right 01/20/2015   Procedure: RIGHT THYROIDECTOMY;  Surgeon: Ascencion Dike, MD;  Location: [place];  Service: ENT;  Laterality: Right;  . TONSILLECTOMY  as child  . TRANSURETHRAL RESECTION OF BLADDER TUMOR N/A 05/15/2016   Procedure: TRANSURETHRAL RESECTION OF BLADDER TUMOR (TURBT) AND INSTILLATION OF EPIRUBICIN;  Surgeon: Franchot Gallo, MD;  Location: [facility];  Service: Urology;  Laterality: N/A;  . TRANSURETHRAL RESECTION OF BLADDER TUMOR N/A 03/29/2020   Procedure: TRANSURETHRAL RESECTION OF BLADDER TUMOR (TURBT);  Surgeon: Franchot Gallo, MD;  Location: [facility];  Service: Urology;  Laterality: N/A;    Allergies  Allergen Reactions  . Flomax [Tamsulosin Hcl] Nausea And Vomiting and Other (See Comments)    "Pt thought he was dying " DIZZY  . Fentanyl     Makes me combative   . Tamsulosin Nausea Only    Social History   Socioeconomic History  . Marital status: Married    Spouse name: Not on file  . Number of children: 3  . Years of education: 38  . Highest education level: Not on file  Occupational History  .  Occupation: Production designer, theatre/television/film    Comment: retired  Tobacco Use  . Smoking status: Former Smoker    Packs/day: 1.00    Years: 45.00    Pack years: 45.00    Types: Cigarettes    Quit date: 09/26/2010    Years since quitting: 9.9  . Smokeless tobacco: Never Used  Vaping Use  . Vaping Use: Never used  Substance and Sexual Activity  . Alcohol use: No  . Drug use: Never  . Sexual activity: Not Currently  Other Topics Concern  . Not on file  Social History Narrative   Widowed, lives with dog, [place] in [place], wife passed from Parkinson's disease 3 1/2 yrs ago   1 cup coffee daily   Social Determinants of Health   Financial Resource Strain:   . Difficulty of Paying Living Expenses: Not on file  Food Insecurity:   . Worried About Charity fundraiser in the  Last Year: Not on file  . Ran Out of Food in the Last Year: Not on file  Transportation Needs:   . Lack of Transportation (Medical): Not on file  . Lack of Transportation (Non-Medical): Not on file  Physical Activity:   . Days of Exercise per Week: Not on file  . Minutes of Exercise per Session: Not on file  Stress:   . Feeling of Stress : Not on file  Social Connections:   . Frequency of Communication with Friends and Family: Not on file  . Frequency of Social Gatherings with Friends and Family: Not on file  . Attends Religious Services: Not on file  . Active Member of Clubs or Organizations: Not on file  . Attends Archivist Meetings: Not on file  . Marital Status: Not on file  Intimate Partner Violence:   . Fear of Current or Ex-Partner: Not on file  . Emotionally Abused: Not on file  . Physically Abused: Not on file  . Sexually Abused: Not on file    Family History  Problem Relation Age of Onset  . Stroke Sister   . Breast cancer Sister   . Stroke Brother      Review of Systems Shortness of breath with activity Productive cough Irregular heartbeat Feet swelling Joint stiffness in wrist and  fingers    Objective:   Physical Exam  Gen. Pleasant, elderly,well-nourished, in no distress, normal affect ENT - no pallor,icterus, no post nasal drip Neck: No JVD, no thyromegaly, no carotid bruits Lungs: no use of accessory muscles, no dullness to percussion, clear without rales or rhonchi  Cardiovascular: Rhythm regular, heart sounds  normal, no murmurs or gallops, no peripheral edema Abdomen: soft and non-tender, no hepatosplenomegaly, BS normal. Musculoskeletal: No deformities, no cyanosis or clubbing Neuro:  alert, non focal       Assessment & Plan:

## 2020-09-22 NOTE — Patient Instructions (Signed)
Schedule PFts  Based on this, we willd ecide about increasing medications & pulmonary rehab

## 2020-09-22 NOTE — Assessment & Plan Note (Signed)
We will schedule PFTs to assess lung function.  Because of his worsening over the past few years is unclear.  This may be related to deconditioning rather than progression of COPD.  Last echo in 2016 shows normal LV function, he does have pedal edema but no signs of overt heart failure such as orthopnea/paroxysmal nocturnal dyspnea.  He is on Eliquis so no reason to suspect VTE and his symptoms of dyspnea appear to be chronic.  At any rate he can be cleared for hip surgery with usual precautions, from a pulmonary standpoint. Based on lung function, we will consider increasing from Symbicort to Breztri/triple therapy.  He will likely require extensive rehab after hip surgery and eventually we can enroll him in pulmonary rehab program

## 2020-09-27 ENCOUNTER — Telehealth: Payer: Self-pay | Admitting: *Deleted

## 2020-09-27 DIAGNOSIS — M1612 Unilateral primary osteoarthritis, left hip: Secondary | ICD-10-CM | POA: Diagnosis not present

## 2020-09-27 NOTE — Telephone Encounter (Signed)
   [facility] Pre-operative Risk Assessment    HEARTCARE STAFF: - Please ensure there is not already an duplicate clearance open for this procedure. - Under Visit Info/Reason for Call, type in Other and utilize the format Clearance MM/DD/YY or Clearance TBD. Do not use dashes or single digits. - If request is for dental extraction, please clarify the # of teeth to be extracted.  Request for surgical clearance:  1. What type of surgery is being performed? LEFT TOTAL HIP REPLACEMENT   2. When is this surgery scheduled? TBD   3. What type of clearance is required (medical clearance vs. Pharmacy clearance to hold med vs. Both)? BOTH  4. Are there any medications that need to be held prior to surgery and how long? ELIQUIS   5. Practice name and name of physician performing surgery? [facility]; DR. Christia Reading MURPHY   6. What is the office phone number? [phone]   7.   What is the office fax number? [place].   Anesthesia type (None, local, MAC, general) ? CHOICE   Julaine Hua 09/27/2020, 4:21 PM  _________________________________________________________________   (provider comments below)

## 2020-09-27 NOTE — Telephone Encounter (Signed)
   Primary Cardiologist: Candee Furbish, MD  Chart reviewed as part of pre-operative protocol coverage. Because of Kyle Owen's past medical history and time since last visit, he will require a follow-up visit in order to better assess preoperative cardiovascular risk.  Pre-op covering staff: - Please schedule appointment and call patient to inform them. If patient already had an upcoming appointment within acceptable timeframe, please add "pre-op clearance" to the appointment notes so provider is aware. - Please contact requesting surgeon's office via preferred method (i.e, phone, fax) to inform them of need for appointment prior to surgery.  If applicable, this message will also be routed to pharmacy pool and/or primary cardiologist for input on holding anticoagulant/antiplatelet agent as requested below so that this information is available to the clearing provider at time of patient's appointment.    He has an appt with Dr. Marlou Porch on 11/02/20. Does Dr. Marlou Porch have an earlier appt? Appt date is TBD. If patient needs surgery earlier, may need to schedule with APP.    [place], PA  09/27/2020, 5:23 PM

## 2020-09-28 NOTE — Telephone Encounter (Signed)
Pt would prefer to have the surgery soon. Please schedule earlier with Dr. Marlou Porch or an APP. Please keep the 11/9 appt with Dr. Marlou Porch for now. I let them know scheduling will reach out.

## 2020-09-28 NOTE — Telephone Encounter (Signed)
Patient with diagnosis of afib on Eliquis for anticoagulation.    Procedure: LEFT TOTAL HIP REPLACEMENT  Date of procedure: TBD  CHADS2-VASc score of  4 (AGE, stroke/tia x 2, AGE)  CrCl 46 ml/min  Due to pt hx of stroke, he is high risk off anticoagulation. I will defer to Dr. Marlou Porch. Typically need a 3 day hold for hip replacement due to spinal anesthesia.

## 2020-09-28 NOTE — Telephone Encounter (Signed)
Patient has an appointment with Dr. Marlou Porch 10/04/20.

## 2020-10-04 ENCOUNTER — Other Ambulatory Visit: Payer: Self-pay

## 2020-10-04 ENCOUNTER — Ambulatory Visit (INDEPENDENT_AMBULATORY_CARE_PROVIDER_SITE_OTHER): Payer: Medicare Other | Admitting: Cardiology

## 2020-10-04 ENCOUNTER — Encounter: Payer: Self-pay | Admitting: Cardiology

## 2020-10-04 VITALS — BP 130/80 | HR 63 | Ht 73.0 in | Wt 209.8 lb

## 2020-10-04 DIAGNOSIS — I712 Thoracic aortic aneurysm, without rupture, unspecified: Secondary | ICD-10-CM

## 2020-10-04 DIAGNOSIS — I480 Paroxysmal atrial fibrillation: Secondary | ICD-10-CM

## 2020-10-04 DIAGNOSIS — I639 Cerebral infarction, unspecified: Secondary | ICD-10-CM

## 2020-10-04 LAB — BASIC METABOLIC PANEL
BUN/Creatinine Ratio: 21 (ref 10–24)
BUN: 30 mg/dL — ABNORMAL HIGH (ref 8–27)
CO2: 26 mmol/L (ref 20–29)
Calcium: 9.2 mg/dL (ref 8.6–10.2)
Chloride: 102 mmol/L (ref 96–106)
Creatinine, Ser: 1.46 mg/dL — ABNORMAL HIGH (ref 0.76–1.27)
GFR calc Af Amer: 52 mL/min/{1.73_m2} — ABNORMAL LOW (ref >59)
GFR calc non Af Amer: 45 mL/min/{1.73_m2} — ABNORMAL LOW (ref >59)
Glucose: 94 mg/dL (ref 65–99)
Potassium: 4.4 mmol/L (ref 3.5–5.2)
Sodium: 142 mmol/L (ref 134–144)

## 2020-10-04 NOTE — Patient Instructions (Addendum)
Medication Instructions:  No changes *If you need a refill on your cardiac medications before your next appointment, please call your pharmacy*   Lab Work: Today: BMET  If you have labs (blood work) drawn today and your tests are completely normal, you will receive your results only by: Marland Kitchen MyChart Message (if you have MyChart) OR . A paper copy in the mail If you have any lab test that is abnormal or we need to change your treatment, we will call you to review the results.   Testing/Procedures: CT-scan of the chest.  Please schedule this today.   Follow-Up: At [facility], you and your health needs are our priority.  As part of our continuing mission to provide you with exceptional heart care, we have created designated Provider Care Teams.  These Care Teams include your primary Cardiologist (physician) and Advanced Practice Providers (APPs -  Physician Assistants and Nurse Practitioners) who all work together to provide you with the care you need, when you need it.  Your next appointment:   12 month(s)  The format for your next appointment:   In Person  Provider:   You may see Candee Furbish, MD or one of the following Advanced Practice Providers on your designated Care Team:    Truitt Merle, NP  Cecilie Kicks, NP  Kathyrn Drown, NP    Other Instructions Please call and schedule follow up with Dr. Lucianne Lei Trigt-cardiothoracic surgery.

## 2020-10-04 NOTE — Progress Notes (Signed)
Cardiology Office Note:    Date:  10/04/2020   ID:  Kyle Owen, DOB 1941/12/19, MRN [id number]  PCP:  Asencion Noble, MD  [facility] Cardiologist:  Candee Furbish, MD  [facility] Electrophysiologist:  None   Referring MD: Asencion Noble, MD     History of Present Illness:    Kyle Owen is a 79 y.o. 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  Dr. Darcey Nora has been following as well.  Also has paroxysmal atrial fibrillation-on Eliquis.  No major signs of bleeding.  Loop implanted expired battery.  Does not wish to pursue explantation.  He also has been taking atorvastatin with no myalgias.  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  He is eager to have a hip replacement soon.  Denies any chest pain.  He does have some mild shortness of breath with his COPD.  No syncope no rapid arrhythmias.  Does not feel any atrial fibrillation.  Prior burden quite low.  Past Medical History:  Diagnosis Date  . Arthritis    DJD right knee  . Benign prostatic hypertrophy with urinary frequency   . Bladder cancer ([place])   . Complication of anesthesia    gets combative in recovery  . COPD with emphysema ([place])   . Depression   . Dyspnea    when walking  . Dysrhythmia    a fib  . History of colon polyps   . History of kidney stones   . History of loop recorder    loop recorder in place battery is dead has not had changed due to covid  . History of pneumonia    hx Recurrent -- last bout Dec 2016 CAP-- per pt resolved  . 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 Hyperlipidemia   . Multinodular thyroid     per pathology report 11-12-2014 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   . Nephrolithiasis    right non-obstructive  per CT 05-02-2016  . Ocular migraine    controlled w/ verapamil  . PAF (paroxysmal atrial fibrillation) ([place])    followed by AFIB clinic-- cardiologist-- dr Marlou Porch  . Pneumonia    history of pnu.  . Pulmonary nodule, left    left lower lobe x2 per CT 01-20-2016  . Renal cyst, left   . Thoracic aortic aneurysm without rupture ([place])    ascending -- ct chest 01/10/16  4.8cm  . Wears hearing aid    bilateral-- wear at times    Past Surgical History:  Procedure Laterality Date  . ANTERIOR CERVICAL DECOMP/DISCECTOMY FUSION N/A 04/03/2017   Procedure: Cervical three-four, Cervical four-five Anterior cervical decompression/discectomy/fusion;  Surgeon: Erline Levine, MD;  Location: [place];  Service: Neurosurgery;  Laterality: N/A;  . CATARACT EXTRACTION W/ INTRAOCULAR LENS IMPLANT Right 2016  . CATARACT EXTRACTION W/PHACO  12/16/2012   Procedure: CATARACT EXTRACTION PHACO AND INTRAOCULAR LENS PLACEMENT (IOC);  Surgeon: Tonny Branch, MD;  [facility];  Service: Ophthalmology;  Laterality: Left;  CDE:17.31  . COLONOSCOPY  10/03/2011   Procedure: COLONOSCOPY;  Surgeon: Jamesetta So;  [facility];  Service: Gastroenterology;  Laterality:  N/A;  . CYSTOSCOPY/RETROGRADE/URETEROSCOPY/STONE EXTRACTION WITH BASKET Right 03/29/2020   Procedure: CYSTOSCOPY/RETROGRADE/URETEROSCOPY/STONE EXTRACTION WITH BASKET;  Surgeon: Franchot Gallo, MD;  [facility];  Service: Urology;  Laterality: Right;  1 HR  . DECOMPRESSIOIN ULNAR NERVE AND CUBITAL TUNNEL RELEASE Left 07-14-2009   elbow  . 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 HOLMIUM LASER APPLICATION Right 03/28/3153   Procedure: HOLMIUM LASER APPLICATION;  Surgeon: Franchot Gallo, MD;  [facility];  Service: Urology;  Laterality:  Right;  . INGUINAL HERNIA REPAIR Right 1990's  . KNEE ARTHROSCOPY Right 2015  . LEFT CUBITAL TUNNEL RELEASE    . POSTERIOR LUMBAR FUSION  2014   X2  . SHOULDER ARTHROSCOPY Left 1990's  . TEE WITHOUT CARDIOVERSION N/A 07/27/2015   Procedure: TRANSESOPHAGEAL ECHOCARDIOGRAM (TEE);  Surgeon: Lelon Perla, MD;  Location: [facility];  Service: Cardiovascular;  Laterality: N/A;   normal LV function, ef 55-60%,  mild AR and MR, mild dilated ascending aorta (4.2cm),  mild to moderate atherosclerosis  descending aorta,  mild to moderate TR,  negative saline microcavitation study  . THYROIDECTOMY Right 01/20/2015   Procedure: RIGHT THYROIDECTOMY;  Surgeon: Ascencion Dike, MD;  Location: [place];  Service: ENT;  Laterality: Right;  . TONSILLECTOMY  as child  . TRANSURETHRAL RESECTION OF BLADDER TUMOR N/A 05/15/2016   Procedure: TRANSURETHRAL RESECTION OF BLADDER TUMOR (TURBT) AND INSTILLATION OF EPIRUBICIN;  Surgeon: Franchot Gallo, MD;  Location: [facility];  Service: Urology;  Laterality: N/A;  . TRANSURETHRAL RESECTION OF BLADDER TUMOR N/A 03/29/2020   Procedure: TRANSURETHRAL RESECTION OF BLADDER TUMOR (TURBT);  Surgeon: Franchot Gallo, MD;  [facility];  Service: Urology;  Laterality: N/A;    Current Medications: Current Meds  Medication Sig  . acetaminophen (TYLENOL) 500 MG tablet Take 500 mg by mouth daily.   Marland Kitchen atorvastatin (LIPITOR) 20 MG tablet TAKE 1 TABLET BY MOUTH DAILY.  . budesonide-formoterol (SYMBICORT) 160-4.5 MCG/ACT inhaler Inhale 2 puffs into the lungs 2 (two) times daily.  Marland Kitchen ELIQUIS 5 MG TABS tablet TAKE (1) TABLET BY MOUTH TWICE DAILY.  . finasteride (PROSCAR) 5 MG tablet Take 5 mg by mouth daily.   . Magnesium 250 MG TABS Take 500 mg by mouth daily.   Marland Kitchen OVER THE COUNTER MEDICATION Apply 1 application topically daily as needed (Joint pain).  Marland Kitchen sertraline (ZOLOFT) 50 MG tablet Take 50 mg by mouth every morning.   . VENTOLIN HFA 108 (90 BASE) MCG/ACT inhaler  Inhale 2 puffs into the lungs Every 4 hours as needed for wheezing or shortness of breath. Reported on 12/23/2015  . verapamil (CALAN-SR) 240 MG CR tablet Take 240 mg by mouth daily.      Allergies:   Flomax [tamsulosin hcl], Fentanyl, and Tamsulosin   Social History   Socioeconomic History  . Marital status: Married    Spouse name: Not on file  . Number of children: 3  . Years of education: 12  . Highest education level: Not on file  Occupational History  . Occupation: Production designer, theatre/television/film    Comment: retired  Tobacco Use  . Smoking status: Former Smoker    Packs/day: 1.00    Years: 45.00    Pack years: 45.00    Types: Cigarettes    Quit date: 09/26/2010    Years since quitting: 10.0  . Smokeless tobacco: Never Used  Vaping Use  . Vaping Use: Never used  Substance and Sexual  Activity  . Alcohol use: No  . Drug use: Never  . Sexual activity: Not Currently  Other Topics Concern  . Not on file  Social History Narrative   Widowed, lives with dog, [place] in [place], wife passed from Parkinson's disease 3 1/2 yrs ago   1 cup coffee daily   Social Determinants of Health   Financial Resource Strain:   . Difficulty of Paying Living Expenses: Not on file  Food Insecurity:   . Worried About Charity fundraiser in the Last Year: Not on file  . Ran Out of Food in the Last Year: Not on file  Transportation Needs:   . Lack of Transportation (Medical): Not on file  . Lack of Transportation (Non-Medical): Not on file  Physical Activity:   . Days of Exercise per Week: Not on file  . Minutes of Exercise per Session: Not on file  Stress:   . Feeling of Stress : Not on file  Social Connections:   . Frequency of Communication with Friends and Family: Not on file  . Frequency of Social Gatherings with Friends and Family: Not on file  . Attends Religious Services: Not on file  . Active Member of Clubs or Organizations: Not on file  . Attends Archivist Meetings: Not on file  .  Marital Status: Not on file     Family History: The patient's family history includes Breast cancer in his sister; Stroke in his brother and sister.  ROS:   Please see the history of present illness.     All other systems reviewed and are negative.  EKGs/Labs/Other Studies Reviewed:    EKG:  EKG is  ordered today.  The ekg ordered today demonstrates sinus rhythm 63 no other abnormalities  Recent Labs: 03/23/2020: Hemoglobin 13.3; Platelets 149 10/04/2020: BUN 30; Creatinine, Ser 1.46; Potassium 4.4; Sodium 142  Recent Lipid Panel    Component Value Date/Time   CHOL 225 (H) 06/09/2015 1040   TRIG 100 06/09/2015 1040   HDL 68 06/09/2015 1040   CHOLHDL 3.3 06/09/2015 1040   LDLCALC 137 (H) 06/09/2015 1040     Risk Assessment/Calculations:     CHA2DS2-VASc Score = 6  This indicates a 9.7% annual risk of stroke. The patient's score is based upon: CHF History: 0 HTN History: 1 Diabetes History: 0 Stroke History: 2 Vascular Disease History: 1 Age Score: 2 Gender Score: 0      Physical Exam:    VS:  BP 130/80   Pulse 63   Ht 6\' 1"  (1.854 m)   Wt 209 lb 12.8 oz (95.2 kg)   SpO2 90%   BMI 27.68 kg/m     Wt Readings from Last 3 Encounters:  10/04/20 209 lb 12.8 oz (95.2 kg)  09/22/20 208 lb (94.3 kg)  05/04/20 209 lb (94.8 kg)     GEN:  Well nourished, well developed in no acute distress, utilizes a cane HEENT: Normal NECK: No JVD; No carotid bruits LYMPHATICS: No lymphadenopathy CARDIAC: RRR, no murmurs, rubs, gallops RESPIRATORY:  Clear to auscultation without rales, wheezing or rhonchi  ABDOMEN: Soft, non-tender, non-distended MUSCULOSKELETAL:  No edema; No deformity  SKIN: Warm and dry NEUROLOGIC:  Alert and oriented x 3 PSYCHIATRIC:  Normal affect   ASSESSMENT:    1. Paroxysmal atrial fibrillation (HCC)   2. Thoracic aortic aneurysm without rupture ([place])   3. Cryptogenic stroke ([place])    PLAN:    In order of problems listed  above:  Preoperative hip  surgery -Nuclear stress test reviewed from 2018 was low risk with no ischemia normal ejection fraction -He does have a thoracic aortic aneurysm that has been stable at 4.7 cm last checked a little over a year ago.  I will go ahead and order a CT scan of chest with contrast to monitor and to make sure that this is maintaining stability.  Obviously if the aneurysms were markedly enlarged, he would not go forward with his hip replacement.  If CT of thoracic aorta aneurysm is stable, he may proceed with hip replacement with moderate overall cardiac risk based mainly upon his age. -I have discussed his case with her pharmacy team.  If he ends up getting the go ahead, we would have him sit down with the pharmacy team to give a dose or 2 of Lovenox as a bridge.  Ascending aortic thoracic aneurysm -4.7 cm stable.  Avoiding Cipro which can sometimes result in increased risk of rupture.  Dr. Darcey Nora with cardiothoracic surgery has been monitoring.  We will help facilitate return appointment.  It has been slightly over a year.  As above, I will go ahead and order the chest CT now because he is requiring hip surgery.  Checking a basic metabolic profile prior.  Creatinine was slightly elevated previously.  Paroxysmal atrial fibrillation -Previously about 3% burden on loop recorder.  Doing well with Eliquis for chronic anticoagulation.  Has prior cryptogenic stroke.Marland Kitchen  Watching with disequilibrium.  Mixed hyperlipidemia -Continue use of atorvastatin-no myalgia.  Continue with current medication strategy.  ALT 11.  History of cryptogenic stroke -Atrial fibrillation was discovered at that time.  Does have some disequilibrium.  Does not like boating anymore.  Bladder cancer -Dr. Stephenie Acres  COPD -Former smoker quit in 2011.  Still gets short of breath however.  Lab work reviewed-LDL 75 hemoglobin 14.1 creatinine 1.47 from 07/26/2020 potassium 4.2 ALT 11   Medication  Adjustments/Labs and Tests Ordered: Current medicines are reviewed at length with the patient today.  Concerns regarding medicines are outlined above.  Orders Placed This Encounter  Procedures  . CT ANGIO CHEST AORTA W/CM & OR WO/CM  . CT ANGIO CHEST AORTA W/CM & OR WO/CM  . Basic metabolic panel  . EKG 12-Lead   No orders of the defined types were placed in this encounter.   Patient Instructions  Medication Instructions:  No changes *If you need a refill on your cardiac medications before your next appointment, please call your pharmacy*   Lab Work: Today: BMET  If you have labs (blood work) drawn today and your tests are completely normal, you will receive your results only by: Marland Kitchen MyChart Message (if you have MyChart) OR . A paper copy in the mail If you have any lab test that is abnormal or we need to change your treatment, we will call you to review the results.   Testing/Procedures: CT-scan of the chest.  Please schedule this today.   Follow-Up: At [facility], you and your health needs are our priority.  As part of our continuing mission to provide you with exceptional heart care, we have created designated Provider Care Teams.  These Care Teams include your primary Cardiologist (physician) and Advanced Practice Providers (APPs -  Physician Assistants and Nurse Practitioners) who all work together to provide you with the care you need, when you need it.  Your next appointment:   12 month(s)  The format for your next appointment:   In Person  Provider:   You may see  Candee Furbish, MD or one of the following Advanced Practice Providers on your designated Care Team:    Truitt Merle, NP  Cecilie Kicks, NP  Kathyrn Drown, NP    Other Instructions Please call and schedule follow up with Dr. Lucianne Lei Trigt-cardiothoracic surgery.      Signed, Candee Furbish, MD  10/04/2020 5:08 PM    [place]

## 2020-10-14 ENCOUNTER — Other Ambulatory Visit: Payer: Medicare Other

## 2020-10-14 DIAGNOSIS — Z20822 Contact with and (suspected) exposure to covid-19: Secondary | ICD-10-CM

## 2020-10-15 LAB — SARS-COV-2, NAA 2 DAY TAT

## 2020-10-15 LAB — NOVEL CORONAVIRUS, NAA: SARS-CoV-2, NAA: NOT DETECTED

## 2020-10-26 ENCOUNTER — Ambulatory Visit (HOSPITAL_COMMUNITY)
Admission: RE | Admit: 2020-10-26 | Discharge: 2020-10-26 | Disposition: A | Discharge status: Home / Self Care | Payer: Medicare Other | Source: Ambulatory Visit | Attending: Cardiology | Admitting: Cardiology

## 2020-10-26 ENCOUNTER — Other Ambulatory Visit: Payer: Self-pay

## 2020-10-26 DIAGNOSIS — I712 Thoracic aortic aneurysm, without rupture: Secondary | ICD-10-CM | POA: Insufficient documentation

## 2020-10-26 MED ORDER — IOHEXOL 350 MG/ML SOLN
80.0000 mL | Freq: Once | INTRAVENOUS | Status: AC | PRN
  Administered 2020-10-26: 80 mL via INTRAVENOUS

## 2020-11-02 ENCOUNTER — Ambulatory Visit: Payer: Medicare Other | Admitting: Cardiology

## 2020-11-03 ENCOUNTER — Encounter: Payer: Self-pay | Admitting: Cardiothoracic Surgery

## 2020-11-03 ENCOUNTER — Ambulatory Visit (INDEPENDENT_AMBULATORY_CARE_PROVIDER_SITE_OTHER): Payer: Medicare Other | Admitting: Cardiothoracic Surgery

## 2020-11-03 ENCOUNTER — Other Ambulatory Visit: Payer: Self-pay

## 2020-11-03 VITALS — BP 127/67 | HR 64 | Temp 97.6°F | Resp 20 | Ht 73.0 in | Wt 208.0 lb

## 2020-11-03 DIAGNOSIS — I639 Cerebral infarction, unspecified: Secondary | ICD-10-CM | POA: Diagnosis not present

## 2020-11-03 DIAGNOSIS — I7121 Aneurysm of the ascending aorta, without rupture: Secondary | ICD-10-CM

## 2020-11-03 DIAGNOSIS — M314 Aortic arch syndrome [Takayasu]: Secondary | ICD-10-CM

## 2020-11-03 DIAGNOSIS — I712 Thoracic aortic aneurysm, without rupture: Secondary | ICD-10-CM | POA: Diagnosis not present

## 2020-11-03 NOTE — Progress Notes (Signed)
PCP is Asencion Noble, MD Referring Provider is Jerline Pain, MD  Chief Complaint  Patient presents with  . Thoracic Aortic Aneurysm    f/u after CTA Chest 10/26/20    HPI: Patient presents with CTA for annual review of a stable asymptomatic 4.7 cm fusiform ascending aneurysm unchanged since 2016.  Patient denies any chest or back pain.  His main pain is related to his left hip and he is scheduled to have a left hip replacement in the near future by Dr. Percell Miller.  His last echocardiogram 3-4 years ago showed normal EF and no significant valvular disease.  Patient continues to control his blood pressure well.  He will try to avoid Levaquin which he understands can weaken the connective tissue in the wall of the aorta.  I personally reviewed the images of his CTA today which shows no change in the 4.7 cm fusiform ascending aneurysm which has no evidence of mural thickening or ulceration.  At this diameter he is at very low risk of aortic tear- dissection as long as he keeps the blood pressure under control.  Past Medical History:  Diagnosis Date  . Arthritis    DJD right knee  . Benign prostatic hypertrophy with urinary frequency   . Bladder cancer ([place])   . Complication of anesthesia    gets combative in recovery  . COPD with emphysema ([place])   . Depression   . Dyspnea    when walking  . Dysrhythmia    a fib  . History of colon polyps   . History of kidney stones   . History of loop recorder    loop recorder in place battery is dead has not had changed due to covid  . History of pneumonia    hx Recurrent -- last bout Dec 2016 CAP-- per pt resolved  . 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 Hyperlipidemia   . Multinodular thyroid    per pathology report 11-12-2014 bilateral thyroid  benign  multinoduler follicular adenoma and hyperplastic   . Nephrolithiasis    right non-obstructive  per CT 05-02-2016  . Ocular migraine    controlled w/ verapamil  . PAF (paroxysmal atrial fibrillation) ([place])    followed by AFIB clinic-- cardiologist-- dr Marlou Porch  . Pneumonia    history of pnu.  . Pulmonary nodule, left    left lower lobe x2 per CT 01-20-2016  . Renal cyst, left   . Thoracic aortic aneurysm without rupture ([place])    ascending -- ct chest 01/10/16  4.8cm  . Wears hearing aid    bilateral-- wear at times    Past Surgical History:  Procedure Laterality Date  . ANTERIOR CERVICAL DECOMP/DISCECTOMY FUSION N/A 04/03/2017   Procedure: Cervical three-four, Cervical four-five Anterior cervical decompression/discectomy/fusion;  Surgeon: Erline Levine, MD;  Location: [place];  Service: Neurosurgery;  Laterality: N/A;  . CATARACT EXTRACTION W/ INTRAOCULAR LENS IMPLANT Right 2016  . CATARACT EXTRACTION W/PHACO  12/16/2012   Procedure: CATARACT EXTRACTION PHACO AND INTRAOCULAR LENS PLACEMENT (IOC);  Surgeon: Tonny Branch, MD;  [facility];  Service: Ophthalmology;  Laterality: Left;  CDE:17.31  . COLONOSCOPY  10/03/2011   Procedure: COLONOSCOPY;  Surgeon: Jamesetta So;  [facility];  Service: Gastroenterology;  Laterality: N/A;  . CYSTOSCOPY/RETROGRADE/URETEROSCOPY/STONE EXTRACTION WITH BASKET Right 03/29/2020   Procedure:  CYSTOSCOPY/RETROGRADE/URETEROSCOPY/STONE EXTRACTION WITH BASKET;  Surgeon: Franchot Gallo, MD;  [facility];  Service: Urology;  Laterality: Right;  1 HR  . DECOMPRESSIOIN ULNAR NERVE AND CUBITAL TUNNEL RELEASE Left 07-14-2009   elbow  . 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 HOLMIUM LASER APPLICATION Right 12/30/1094   Procedure: HOLMIUM LASER APPLICATION;  Surgeon: Franchot Gallo, MD;  [facility];  Service: Urology;  Laterality: Right;  . INGUINAL HERNIA REPAIR Right 1990's  . KNEE  ARTHROSCOPY Right 2015  . LEFT CUBITAL TUNNEL RELEASE    . POSTERIOR LUMBAR FUSION  2014   X2  . SHOULDER ARTHROSCOPY Left 1990's  . TEE WITHOUT CARDIOVERSION N/A 07/27/2015   Procedure: TRANSESOPHAGEAL ECHOCARDIOGRAM (TEE);  Surgeon: Lelon Perla, MD;  Location: [facility];  Service: Cardiovascular;  Laterality: N/A;   normal LV function, ef 55-60%,  mild AR and MR, mild dilated ascending aorta (4.2cm),  mild to moderate atherosclerosis  descending aorta,  mild to moderate TR,  negative saline microcavitation study  . THYROIDECTOMY Right 01/20/2015   Procedure: RIGHT THYROIDECTOMY;  Surgeon: Ascencion Dike, MD;  Location: [place];  Service: ENT;  Laterality: Right;  . TONSILLECTOMY  as child  . TRANSURETHRAL RESECTION OF BLADDER TUMOR N/A 05/15/2016   Procedure: TRANSURETHRAL RESECTION OF BLADDER TUMOR (TURBT) AND INSTILLATION OF EPIRUBICIN;  Surgeon: Franchot Gallo, MD;  Location[facility];  Service: Urology;  Laterality: N/A;  . TRANSURETHRAL RESECTION OF BLADDER TUMOR N/A 03/29/2020   Procedure: TRANSURETHRAL RESECTION OF BLADDER TUMOR (TURBT);  Surgeon: Franchot Gallo, MD;  [facility];  Service: Urology;  Laterality: N/A;    Family History  Problem Relation Age of Onset  . Stroke Sister   . Breast cancer Sister   . Stroke Brother     Social History Social History   Tobacco Use  . Smoking status: Former Smoker    Packs/day: 1.00    Years: 45.00    Pack years: 45.00    Types: Cigarettes    Quit date: 09/26/2010    Years since quitting: 10.1  . Smokeless tobacco: Never Used  Vaping Use  . Vaping Use: Never used  Substance Use Topics  . Alcohol use: No  . Drug use: Never    Current Outpatient Medications  Medication Sig Dispense Refill  . acetaminophen (TYLENOL) 500 MG tablet Take 500 mg by mouth daily.     Marland Kitchen atorvastatin (LIPITOR) 20 MG tablet TAKE 1 TABLET BY MOUTH DAILY. 90 tablet 2  . budesonide-formoterol (SYMBICORT) 160-4.5 MCG/ACT inhaler  Inhale 2 puffs into the lungs 2 (two) times daily.    Marland Kitchen ELIQUIS 5 MG TABS tablet TAKE (1) TABLET BY MOUTH TWICE DAILY. 60 tablet 11  . finasteride (PROSCAR) 5 MG tablet Take 5 mg by mouth daily.     . Magnesium 250 MG TABS Take 500 mg by mouth daily.     Marland Kitchen OVER THE COUNTER MEDICATION Apply 1 application topically daily as needed (Joint pain).    Marland Kitchen sertraline (ZOLOFT) 50 MG tablet Take 50 mg by mouth every morning.     . VENTOLIN HFA 108 (90 BASE) MCG/ACT inhaler Inhale 2 puffs into the lungs Every 4 hours as needed for wheezing or shortness of breath. Reported on 12/23/2015    . verapamil (CALAN-SR) 240 MG CR tablet Take 240 mg by mouth daily.      No current facility-administered medications for this visit.  Allergies  Allergen Reactions  . Flomax [Tamsulosin Hcl] Nausea And Vomiting and Other (See Comments)    "Pt thought he was dying " DIZZY  . Fentanyl     Makes me combative   . Tamsulosin Nausea Only    Review of Systems  Patient has had urologic procedures in 2021 for bladder tumor and kidney stones  Patient is being followed by pulmonology for COPD  No chest pains orthopnea or PND. He remains on Eliquis for paroxysmal chronic atrial fibrillation.  No bleeding events.  BP 127/67   Pulse 64   Temp 97.6 F (36.4 C) (Skin)   Resp 20   Ht 6\' 1"  (1.854 m)   Wt 208 lb (94.3 kg)   SpO2 94% Comment: RA  BMI 27.44 kg/m  Physical Exam      Exam    General- alert and comfortable    Neck- no JVD, no cervical adenopathy palpable, no carotid bruit   Lungs- clear without rales, wheezes   Cor- regular rate and rhythm, no murmur , gallop   Abdomen- soft, non-tender   Extremities - warm, non-tender, minimal edema   Neuro- oriented, appropriate, no focal weakness   Diagnostic Tests: CTA images personally reviewed with results as noted above.  The fusiform aneurysm is stable at 4.7 cm diameter, no change in several years.  Impression: Well-controlled blood  pressure Stable fusiform ascending aneurysm stable at 4.7 cm without evidence of ulceration or mural thickening  The patient is a very low risk for dissection less than 1 %/year with blood pressure control and avoiding heavy lifting.  His aortic disease is stable and not a contraindication to undergoing hip replacement.  Plan: Patient return in 18 months for surveillance CTA as there is been no change in the aorta for 5 years.  Continue with blood pressure control.  Continue to avoid Levaquin.   Len Childs, MD [facility] [phone]

## 2020-11-04 DIAGNOSIS — G4452 New daily persistent headache (NDPH): Secondary | ICD-10-CM | POA: Diagnosis not present

## 2020-11-04 DIAGNOSIS — G4700 Insomnia, unspecified: Secondary | ICD-10-CM | POA: Diagnosis not present

## 2020-11-05 ENCOUNTER — Other Ambulatory Visit (HOSPITAL_COMMUNITY)
Admission: RE | Admit: 2020-11-05 | Discharge: 2020-11-05 | Disposition: A | Discharge status: Home / Self Care | Payer: Medicare Other | Source: Ambulatory Visit | Attending: Pulmonary Disease | Admitting: Pulmonary Disease

## 2020-11-05 ENCOUNTER — Other Ambulatory Visit: Payer: Self-pay

## 2020-11-05 DIAGNOSIS — Z20822 Contact with and (suspected) exposure to covid-19: Secondary | ICD-10-CM | POA: Insufficient documentation

## 2020-11-05 DIAGNOSIS — Z01812 Encounter for preprocedural laboratory examination: Secondary | ICD-10-CM | POA: Insufficient documentation

## 2020-11-05 LAB — SARS CORONAVIRUS 2 (TAT 6-24 HRS): SARS Coronavirus 2: NEGATIVE

## 2020-11-09 ENCOUNTER — Ambulatory Visit (HOSPITAL_COMMUNITY)
Admission: RE | Admit: 2020-11-09 | Discharge: 2020-11-09 | Disposition: A | Discharge status: Home / Self Care | Payer: Medicare Other | Source: Ambulatory Visit | Attending: Pulmonary Disease | Admitting: Pulmonary Disease

## 2020-11-09 ENCOUNTER — Other Ambulatory Visit: Payer: Self-pay

## 2020-11-09 DIAGNOSIS — R0602 Shortness of breath: Secondary | ICD-10-CM | POA: Diagnosis not present

## 2020-11-09 LAB — PULMONARY FUNCTION TEST
DL/VA % pred: 112 %
DL/VA: 4.32 ml/min/mmHg/L
DLCO unc % pred: 88 %
DLCO unc: 22.84 ml/min/mmHg
FEF 25-75 Post: 1.31 L/sec
FEF 25-75 Pre: 0.95 L/sec
FEF2575-%Change-Post: 37 %
FEF2575-%Pred-Post: 60 %
FEF2575-%Pred-Pre: 44 %
FEV1-%Change-Post: 9 %
FEV1-%Pred-Post: 64 %
FEV1-%Pred-Pre: 58 %
FEV1-Post: 2.01 L
FEV1-Pre: 1.83 L
FEV1FVC-%Change-Post: 0 %
FEV1FVC-%Pred-Pre: 87 %
FEV6-%Change-Post: 7 %
FEV6-%Pred-Post: 74 %
FEV6-%Pred-Pre: 69 %
FEV6-Post: 3.05 L
FEV6-Pre: 2.84 L
FEV6FVC-%Change-Post: -1 %
FEV6FVC-%Pred-Post: 101 %
FEV6FVC-%Pred-Pre: 103 %
FVC-%Change-Post: 8 %
FVC-%Pred-Post: 73 %
FVC-%Pred-Pre: 67 %
FVC-Post: 3.2 L
FVC-Pre: 2.94 L
Post FEV1/FVC ratio: 63 %
Post FEV6/FVC ratio: 95 %
Pre FEV1/FVC ratio: 62 %
Pre FEV6/FVC Ratio: 97 %
RV % pred: 180 %
RV: 4.99 L
TLC % pred: 109 %
TLC: 8.16 L

## 2020-11-09 MED ORDER — ALBUTEROL SULFATE (2.5 MG/3ML) 0.083% IN NEBU
2.5000 mg | INHALATION_SOLUTION | Freq: Once | RESPIRATORY_TRACT | Status: AC
  Administered 2020-11-09: 2.5 mg via RESPIRATORY_TRACT

## 2020-11-16 ENCOUNTER — Encounter: Payer: Self-pay | Admitting: Pulmonary Disease

## 2020-11-16 ENCOUNTER — Other Ambulatory Visit: Payer: Self-pay

## 2020-11-16 ENCOUNTER — Ambulatory Visit (INDEPENDENT_AMBULATORY_CARE_PROVIDER_SITE_OTHER): Payer: Medicare Other | Admitting: Pulmonary Disease

## 2020-11-16 DIAGNOSIS — I639 Cerebral infarction, unspecified: Secondary | ICD-10-CM | POA: Diagnosis not present

## 2020-11-16 DIAGNOSIS — J449 Chronic obstructive pulmonary disease, unspecified: Secondary | ICD-10-CM

## 2020-11-16 DIAGNOSIS — Z01811 Encounter for preprocedural respiratory examination: Secondary | ICD-10-CM | POA: Diagnosis not present

## 2020-11-16 NOTE — Progress Notes (Signed)
   Subjective:    Patient ID: Kyle Owen, male    DOB: June 17, 1941, 79 y.o.   MRN: [id number]  HPI  79 year old ex-smoker for follow-up of COPD  PMH -cryptogenic TIA, atrial fibrillation on Eliquis, bladder cancer , thoracic aortic aneurysm  Chief Complaint  Patient presents with  . Follow-up    Patient feels like breathing is better since last visit, some shortness of breath with exertion    He complains of dyspnea on exertion on walking up a hill.  He is compliant with Symbicort.  Hip replacement surgery is planned and he is awaiting clearance.  We reviewed PFTs today and CT chest.  He has been cleared by cardiology for surgery    Arozullah - Prolonged mech ventilation risk Arozullah Postperative Pulmonary Risk Score - for mech ventilation dependence >48h [employer], Ann Surg 2000, major non-cardiac surgery) Comment Score  Type of surgery - abd ao aneurysm (27), thoracic (21), neurosurgery / upper abdominal / vascular (21), neck (11)   0  Emergency Surgery - (11)   0  ALbumin < 3 or poor nutritional state - (9)   0  BUN > 30 -  (8)   0  Partial or completely dependent functional status - (7)   0  COPD -  (6)   6  Age - 60 to 69 (4), > 70  (6)   6  TOTAL  12   12  Risk Stratifcation scores  - < 10 (0.5%), 11-19 (1.8%), 20-27 (4.2%), 28-40 (10.1%), >40 (26.6%)   1.8 %      Significant tests/ events reviewed  CT angiogram chest 10/2020 -stable 4.7 cm aneurysm CT chest 08/2019  4.7 cm ascending aortic aneurysm .  Stable calcified granuloma right apex   PFTs 10/2020 moderate airway obstruction, ratio 62, FEV1 1.83/58%, FVC 67%, TLC normal, DLCO 22.8/88%   Review of Systems neg for any significant sore throat, dysphagia, itching, sneezing, nasal congestion or excess/ purulent secretions, fever, chills, sweats, unintended wt loss, pleuritic or exertional cp, hempoptysis, orthopnea pnd or change in chronic leg swelling. Also denies presyncope, palpitations, heartburn,  abdominal pain, nausea, vomiting, diarrhea or change in bowel or urinary habits, dysuria,hematuria, rash, arthralgias, visual complaints, headache, numbness weakness or ataxia.     Objective:   Physical Exam  Gen. Pleasant, well-nourished, elderly,in no distress ENT - no thrush, no pallor/icterus,no post nasal drip Neck: No JVD, no thyromegaly, no carotid bruits Lungs: no use of accessory muscles, no dullness to percussion, clear without rales or rhonchi  Cardiovascular: Rhythm regular, heart sounds  normal, no murmurs or gallops, no peripheral edema Musculoskeletal: No deformities, no cyanosis or clubbing        Assessment & Plan:

## 2020-11-16 NOTE — Assessment & Plan Note (Signed)
We will give him a sample of Breztri to trial instead of Symbicort and if this works he will call us for prescription Use albuterol as needed. We discussed signs and symptoms of COPD exacerbation and whenever he has a sinus infection he calls his PCP for an antibiotic

## 2020-11-16 NOTE — Assessment & Plan Note (Signed)
For hip surgery, there is a low risk of postop pulmonary complications and is cleared for surgery. Eliquis should be handled perioperatively as would be routine for orthopedic surgery Early mobilization would be recommended

## 2020-11-16 NOTE — Patient Instructions (Signed)
Sample of breztri - 2 puffs twice daily INSTEAD of symbicort  Cleared for surgery  - Good luck !

## 2020-11-17 DIAGNOSIS — Z23 Encounter for immunization: Secondary | ICD-10-CM | POA: Diagnosis not present

## 2020-11-24 DIAGNOSIS — M1612 Unilateral primary osteoarthritis, left hip: Secondary | ICD-10-CM | POA: Diagnosis not present

## 2020-11-26 NOTE — H&P (Signed)
HIP ARTHROPLASTY ADMISSION H&P  Patient ID: Kyle Owen   MRN: [id number] DOB/AGE: Dec 16, 1941 79 y.o.  Chief Complaint: left hip pain.  Planned Procedure Date: 12/14/20 Medical Clearance by Dr. Asencion Noble   Cardiac Clearance by Dr. Lucius Conn Additional clearance by Dr. Dagmar Hait (Pulmonology)    HPI: Kyle Owen is a 79 y.o. 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 The patient has a history of pain and functional disability in the left hip due to arthritis and has failed non-surgical conservative treatments for greater than 12 weeks to include NSAID's and/or analgesics, corticosteriod injections and activity modification.  Onset of symptoms was gradual, starting 4 years ago with gradually worsening course since that time. The patient noted no past surgery on the left hip.  Patient currently rates pain at 7 out of 10 with activity. Patient has night pain, worsening of pain with activity and weight bearing, pain that interferes with activities of daily living and pain with passive range of motion.  Patient has evidence of periarticular osteophytes and joint space narrowing by imaging studies.  There is no active infection.  Past Medical History:  Diagnosis Date  . Arthritis    DJD right knee  . Benign prostatic hypertrophy with urinary frequency   . Bladder cancer ([place])   . Complication of anesthesia    gets combative in recovery  . COPD with emphysema ([place])   . Depression   . Dyspnea    when walking  . Dysrhythmia    a fib  . History of colon polyps   . History of kidney stones   . History of loop recorder    loop recorder in place battery is dead has not had changed due to covid  . History of pneumonia    hx Recurrent -- last bout Dec 2016 CAP-- per pt resolved  . History of TIA (transient ischemic attack) no residual per pt   per neurologist note (dr Leta Baptist 11-08-2015) dx cryptogenic TIA versus arrhythia versus dysautonomia (right ophthalmic artery TIA and x3  posterior circulation TIAs  . Hyperlipidemia   . 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 Nephrolithiasis    right non-obstructive  per CT 05-02-2016  . Ocular migraine    controlled w/ verapamil  . PAF (paroxysmal atrial fibrillation) ([place])    followed by AFIB clinic-- cardiologist-- dr Marlou Porch  . Pneumonia    history of pnu.  . Pulmonary nodule, left    left lower lobe x2 per CT 01-20-2016  . Renal cyst, left   . Thoracic aortic aneurysm without rupture ([place])    ascending -- ct chest 01/10/16  4.8cm  . Wears hearing aid    bilateral-- wear at times   Past Surgical History:  Procedure Laterality Date  . ANTERIOR CERVICAL DECOMP/DISCECTOMY FUSION N/A 04/03/2017   Procedure: Cervical three-four, Cervical four-five Anterior cervical decompression/discectomy/fusion;  Surgeon: Erline Levine, MD;  Location: [facility];  Service: Neurosurgery;  Laterality: N/A;  . CATARACT EXTRACTION W/ INTRAOCULAR LENS IMPLANT Right 2016  . CATARACT EXTRACTION W/PHACO  12/16/2012   Procedure: CATARACT EXTRACTION PHACO AND INTRAOCULAR LENS PLACEMENT (IOC);  Surgeon: Tonny Branch, MD;  Location: [facility];  Service: Ophthalmology;  Laterality: Left;  CDE:17.31  . COLONOSCOPY  10/03/2011   Procedure: COLONOSCOPY;  Surgeon: Jamesetta So;  Location: [facility];  Service: Gastroenterology;  Laterality: N/A;  . CYSTOSCOPY/RETROGRADE/URETEROSCOPY/STONE EXTRACTION WITH BASKET Right 03/29/2020  Procedure: CYSTOSCOPY/RETROGRADE/URETEROSCOPY/STONE EXTRACTION WITH BASKET;  Surgeon: Franchot Gallo, MD;  Location: [facility];  Service: Urology;  Laterality: Right;  1 HR  . DECOMPRESSIOIN ULNAR NERVE AND CUBITAL TUNNEL RELEASE Left 07-14-2009   elbow  . 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 HOLMIUM LASER APPLICATION Right 05/26/2296   Procedure: HOLMIUM LASER APPLICATION;  Surgeon:  Franchot Gallo, MD;  Location: [facility];  Service: Urology;  Laterality: Right;  . INGUINAL HERNIA REPAIR Right 1990's  . KNEE ARTHROSCOPY Right 2015  . LEFT CUBITAL TUNNEL RELEASE    . POSTERIOR LUMBAR FUSION  2014   X2  . SHOULDER ARTHROSCOPY Left 1990's  . TEE WITHOUT CARDIOVERSION N/A 07/27/2015   Procedure: TRANSESOPHAGEAL ECHOCARDIOGRAM (TEE);  Surgeon: Lelon Perla, MD;  Location: [facility];  Service: Cardiovascular;  Laterality: N/A;   normal LV function, ef 55-60%,  mild AR and MR, mild dilated ascending aorta (4.2cm),  mild to moderate atherosclerosis  descending aorta,  mild to moderate TR,  negative saline microcavitation study  . THYROIDECTOMY Right 01/20/2015   Procedure: RIGHT THYROIDECTOMY;  Surgeon: Ascencion Dike, MD;  Location: [facility];  Service: ENT;  Laterality: Right;  . TONSILLECTOMY  as child  . TRANSURETHRAL RESECTION OF BLADDER TUMOR N/A 05/15/2016   Procedure: TRANSURETHRAL RESECTION OF BLADDER TUMOR (TURBT) AND INSTILLATION OF EPIRUBICIN;  Surgeon: Franchot Gallo, MD;  Location[facility];  Service: Urology;  Laterality: N/A;  . TRANSURETHRAL RESECTION OF BLADDER TUMOR N/A 03/29/2020   Procedure: TRANSURETHRAL RESECTION OF BLADDER TUMOR (TURBT);  Surgeon: Franchot Gallo, MD;  Location: [facility];  Service: Urology;  Laterality: N/A;   Allergies  Allergen Reactions  . Flomax [Tamsulosin Hcl] Nausea And Vomiting and Other (See Comments)    "Pt thought he was dying " DIZZY  . Fentanyl     Makes me combative   . Tamsulosin Nausea Only   Prior to Admission medications   Medication Sig Start Date End Date Taking? Authorizing Provider  acetaminophen (TYLENOL) 500 MG tablet Take 500 mg by mouth daily.     [provider]  atorvastatin (LIPITOR) 20 MG tablet TAKE 1 TABLET BY MOUTH DAILY. 05/09/18   Jerline Pain, MD  budesonide-formoterol (SYMBICORT) 160-4.5 MCG/ACT inhaler Inhale 2 puffs into the lungs 2 (two) times daily.    [provider]  ELIQUIS 5 MG TABS tablet TAKE (1) TABLET BY MOUTH TWICE DAILY. 08/07/17   Jerline Pain, MD  finasteride (PROSCAR) 5 MG tablet Take 5 mg by mouth daily.     [provider]  Magnesium 250 MG TABS Take 500 mg by mouth daily.     [provider]  OVER THE COUNTER MEDICATION Apply 1 application topically daily as needed (Joint pain).    [provider]  sertraline (ZOLOFT) 50 MG tablet Take 50 mg by mouth every morning.     [provider]  VENTOLIN HFA 108 (90 BASE) MCG/ACT inhaler Inhale 2 puffs into the lungs Every 4 hours as needed for wheezing or shortness of breath. Reported on 12/23/2015 09/24/12   [provider]  verapamil (CALAN-SR) 240 MG CR tablet Take 240 mg by mouth daily.     [provider]   Social History   Socioeconomic History  . Marital status: Married    Spouse name: Not on file  . Number of children: 3  . Years of education: 74  . Highest education level:  Not on file  Occupational History  . Occupation: Production designer, theatre/television/film    Comment: retired  Tobacco Use  . Smoking status: Former Smoker    Packs/day: 1.00    Years: 45.00    Pack years: 45.00    Types: Cigarettes    Quit date: 09/26/2010    Years since quitting: 10.1  . Smokeless tobacco: Never Used  Vaping Use  . Vaping Use: Never used  Substance and Sexual Activity  . Alcohol use: No  . Drug use: Never  . Sexual activity: Not Currently  Other Topics Concern  . Not on file  Social History Narrative   Widowed, lives with dog, [place] in [place], wife passed from Parkinson's disease 3 1/2 yrs ago   1 cup coffee daily   Social Determinants of Health   Financial Resource Strain:   . Difficulty of Paying Living Expenses: Not on file  Food Insecurity:   . Worried About Charity fundraiser in the Last Year: Not on file  . Ran Out of Food in the Last Year: Not on file  Transportation Needs:   . Lack of Transportation (Medical): Not on file  .  Lack of Transportation (Non-Medical): Not on file  Physical Activity:   . Days of Exercise per Week: Not on file  . Minutes of Exercise per Session: Not on file  Stress:   . Feeling of Stress : Not on file  Social Connections:   . Frequency of Communication with Friends and Family: Not on file  . Frequency of Social Gatherings with Friends and Family: Not on file  . Attends Religious Services: Not on file  . Active Member of Clubs or Organizations: Not on file  . Attends Archivist Meetings: Not on file  . Marital Status: Not on file   Family History  Problem Relation Age of Onset  . Stroke Sister   . Breast cancer Sister   . Stroke Brother     ROS: Currently denies lightheadedness, dizziness, Fever, chills, CP, SOB.   No personal history of DVT, PE, MI, or CVA. Only history of TIA's  No loose teeth or dentures All other systems have been reviewed and were otherwise currently negative with the exception of those mentioned in the HPI and as above.  Objective: Vitals: Ht: 72"  Wt: 212.4 lbs Temp: 98.3 BP: 149/82 Pulse: 68 O2 93% on room air.   Physical Exam: General: AAO x 4, NAD HEENT: EOMI, Trachea is midline. Head AT/[place] Pulm: No increased work of breathing.  Clear B/L A/P w/o crackle or wheeze.  CV: RRR, No m/g/r appreciated  GI: soft, NT, ND Neuro: Neuro without gross focal deficit.  Sensation intact distally Skin: No lesions in the area of chief complaint MSK/Surgical Site: Left Hip pain with passive ROM.  Positive Stinchfield.  5/5 strength.  NVI.    Imaging Review Plain radiographs demonstrate severe degenerative joint disease of the left hip.   The bone quality appears to be fair for age and reported activity level.  Preoperative templating of the joint replacement has been completed, documented, and submitted to the Operating Room personnel in order to optimize intra-operative equipment management.  Assessment: OA LEFT HIP Active Problems:   * No  active hospital problems. *   Plan: Plan for Procedure(s): TOTAL HIP ARTHROPLASTY ANTERIOR APPROACH  The patient history, physical exam, clinical judgement of the provider and imaging are consistent with end stage degenerative joint disease and total joint arthroplasty is deemed medically  necessary. The treatment options including medical management, injection therapy, and arthroplasty were discussed at length. The risks and benefits of Procedure(s): TOTAL HIP ARTHROPLASTY ANTERIOR APPROACH were presented and reviewed.  The risks of nonoperative treatment, versus surgical intervention including but not limited to continued pain, aseptic loosening, stiffness, dislocation/subluxation, infection, bleeding, nerve injury, blood clots, cardiopulmonary complications, morbidity, mortality, among others were discussed. The patient verbalizes understanding and wishes to proceed with the plan.  Patient is being admitted for surgery, pain control, PT, prophylactic antibiotics, VTE prophylaxis, progressive ambulation, ADL's and discharge planning.   Dental prophylaxis discussed and recommended for 2 years postoperatively.   The patient does meet the criteria for TXA which will be used perioperatively.    Pt.'s own Eliquis (5mg  BID) will be used postoperatively for DVT prophylaxis in addition to SCDs, and early ambulation.  Plan for Oxycodone, Tylenol for pain.  Baclofen for spasm.  Omeprazole for gastric protection. Zofran for n/v.  Celebrex will NOT be used.  The patient is planning to be discharged home with OPPT in care of his wife.   Pt. Will be admitted to observation status.    Rachael Fee, PA-C 11/26/2020 5:26 PM

## 2020-11-29 NOTE — Care Plan (Signed)
Ortho Bundle Case Management Note  Patient Details  Name: Kyle Owen MRN: [id number] Date of Birth: 12/20/1941    Spoke with patient prior to surgery. He will discharge to home with family to assist. He has equipment at home. OPPT set up at [facility]. They will call him with appointment time.   Patient and MD in agreement with plan. Choice offered                  DME Arranged:    DME Agency:     HH Arranged:    HH Agency:     Additional Comments: Please contact me with any questions of if this plan should need to change.  Ladell Heads,  [facility]  [phone] 11/29/2020, 2:57 PM

## 2020-12-03 NOTE — Patient Instructions (Addendum)
DUE TO COVID-19 ONLY ONE VISITOR IS ALLOWED TO COME WITH YOU AND STAY IN THE WAITING ROOM ONLY DURING PRE OP AND PROCEDURE DAY OF SURGERY. THE 1 VISITOR  MAY VISIT WITH YOU AFTER SURGERY IN YOUR PRIVATE ROOM DURING VISITING HOURS ONLY!  YOU NEED TO HAVE A COVID 19 TEST ON: 12/10/20 @ 10:00 AM, THIS TEST MUST BE DONE BEFORE SURGERY,  COVID TESTING SITE [place] [id number], IT IS ON THE RIGHT GOING OUT [address] APPROXIMATELY  2 MINUTES PAST [employer] ON THE RIGHT. ONCE YOUR COVID TEST IS COMPLETED,  PLEASE BEGIN THE QUARANTINE INSTRUCTIONS AS OUTLINED IN YOUR HANDOUT.                Kyle Owen    Your procedure is scheduled on: 12/14/20   Report to [facility] Main  Entrance   Report to admitting at: 7:30 AM     Call this number if you have problems the morning of surgery [phone]    Remember: NO SOLID FOOD AFTER MIDNIGHT THE NIGHT PRIOR TO SURGERY. NOTHING BY MOUTH EXCEPT CLEAR LIQUIDS UNTIL: 7:00 AM . PLEASE FINISH ENSURE DRINK PER SURGEON ORDER  WHICH NEEDS TO BE COMPLETED AT : 7:00 AM.  CLEAR LIQUID DIET   Foods Allowed                                                                     Foods Excluded  Coffee and tea, regular and decaf                             liquids that you cannot  Plain Jell-O any favor except red or purple                                           see through such as: Fruit ices (not with fruit pulp)                                     milk, soups, orange juice  Iced Popsicles                                    All solid food Carbonated beverages, regular and diet                                    Cranberry, grape and apple juices Sports drinks like Gatorade Lightly seasoned clear broth or consume(fat free) Sugar, honey syrup  Sample Menu Breakfast                                Lunch  Supper Cranberry juice                    Beef broth                             Chicken broth Jell-O                                     Grape juice                           Apple juice Coffee or tea                        Jell-O                                      Popsicle                                                Coffee or tea                        Coffee or tea  _____________________________________________________________________  BRUSH YOUR TEETH MORNING OF SURGERY AND RINSE YOUR MOUTH OUT, NO CHEWING GUM CANDY OR MINTS.    Take these medicines the morning of surgery with A SIP OF WATER: finasteride,sertraline.Use inhalers as usual and loratadine as needed.                       You may not have any metal on your body including hair pins and              piercings  Do not wear jewelry, lotions, powders or perfumes, deodorant                          Men may shave face and neck.   Do not bring valuables to the hospital. [place].  Contacts, dentures or bridgework may not be worn into surgery.  Leave suitcase in the car. After surgery it may be brought to your room.     Patients discharged the day of surgery will not be allowed to drive home. IF YOU ARE HAVING SURGERY AND GOING HOME THE SAME DAY, YOU MUST HAVE AN ADULT TO DRIVE YOU HOME AND BE WITH YOU FOR 24 HOURS. YOU MAY GO HOME BY TAXI OR UBER OR ORTHERWISE, BUT AN ADULT MUST ACCOMPANY YOU HOME AND STAY WITH YOU FOR 24 HOURS.  Name and phone number of your driver:  Special Instructions: N/A              Please read over the following fact sheets you were given: _____________________________________________________________________          [facility] - Preparing for Surgery Before surgery, you can play an important role.  Because skin is not sterile, your skin needs to be as free of germs as possible.  You can reduce the  number of germs on your skin by washing with CHG (chlorahexidine gluconate) soap before surgery.  CHG is an antiseptic cleaner  which kills germs and bonds with the skin to continue killing germs even after washing. Please DO NOT use if you have an allergy to CHG or antibacterial soaps.  If your skin becomes reddened/irritated stop using the CHG and inform your nurse when you arrive at Short Stay. Do not shave (including legs and underarms) for at least 48 hours prior to the first CHG shower.  You may shave your face/neck. Please follow these instructions carefully:  1.  Shower with CHG Soap the night before surgery and the  morning of Surgery.  2.  If you choose to wash your hair, wash your hair first as usual with your  normal  shampoo.  3.  After you shampoo, rinse your hair and body thoroughly to remove the  shampoo.                           4.  Use CHG as you would any other liquid soap.  You can apply chg directly  to the skin and wash                       Gently with a scrungie or clean washcloth.  5.  Apply the CHG Soap to your body ONLY FROM THE NECK DOWN.   Do not use on face/ open                           Wound or open sores. Avoid contact with eyes, ears mouth and genitals (private parts).                       Wash face,  Genitals (private parts) with your normal soap.             6.  Wash thoroughly, paying special attention to the area where your surgery  will be performed.  7.  Thoroughly rinse your body with warm water from the neck down.  8.  DO NOT shower/wash with your normal soap after using and rinsing off  the CHG Soap.                9.  Pat yourself dry with a clean towel.            10.  Wear clean pajamas.            11.  Place clean sheets on your bed the night of your first shower and do not  sleep with pets. Day of Surgery : Do not apply any lotions/deodorants the morning of surgery.  Please wear clean clothes to the hospital/surgery center.  FAILURE TO FOLLOW THESE INSTRUCTIONS MAY RESULT IN THE CANCELLATION OF YOUR SURGERY PATIENT SIGNATURE_________________________________  NURSE  SIGNATURE__________________________________  ________________________________________________________________________   Kyle Owen  An incentive spirometer is a tool that can help keep your lungs clear and active. This tool measures how well you are filling your lungs with each breath. Taking long deep breaths may help reverse or decrease the chance of developing breathing (pulmonary) problems (especially infection) following:  A long period of time when you are unable to move or be active. BEFORE THE PROCEDURE   If the spirometer includes an indicator to show your best effort, your nurse or respiratory therapist will set it to a  desired goal.  If possible, sit up straight or lean slightly forward. Try not to slouch.  Hold the incentive spirometer in an upright position. INSTRUCTIONS FOR USE  1. Sit on the edge of your bed if possible, or sit up as far as you can in bed or on a chair. 2. Hold the incentive spirometer in an upright position. 3. Breathe out normally. 4. Place the mouthpiece in your mouth and seal your lips tightly around it. 5. Breathe in slowly and as deeply as possible, raising the piston or the ball toward the top of the column. 6. Hold your breath for 3-5 seconds or for as long as possible. Allow the piston or ball to fall to the bottom of the column. 7. Remove the mouthpiece from your mouth and breathe out normally. 8. Rest for a few seconds and repeat Steps 1 through 7 at least 10 times every 1-2 hours when you are awake. Take your time and take a few normal breaths between deep breaths. 9. The spirometer may include an indicator to show your best effort. Use the indicator as a goal to work toward during each repetition. 10. After each set of 10 deep breaths, practice coughing to be sure your lungs are clear. If you have an incision (the cut made at the time of surgery), support your incision when coughing by placing a pillow or rolled up towels firmly  against it. Once you are able to get out of bed, walk around indoors and cough well. You may stop using the incentive spirometer when instructed by your caregiver.  RISKS AND COMPLICATIONS  Take your time so you do not get dizzy or light-headed.  If you are in pain, you may need to take or ask for pain medication before doing incentive spirometry. It is harder to take a deep breath if you are having pain. AFTER USE  Rest and breathe slowly and easily.  It can be helpful to keep track of a log of your progress. Your caregiver can provide you with a simple table to help with this. If you are using the spirometer at home, follow these instructions: [place] IF:   You are having difficultly using the spirometer.  You have trouble using the spirometer as often as instructed.  Your pain medication is not giving enough relief while using the spirometer.  You develop fever of 100.5 F (38.1 C) or higher. SEEK IMMEDIATE MEDICAL CARE IF:   You cough up bloody sputum that had not been present before.  You develop fever of 102 F (38.9 C) or greater.  You develop worsening pain at or near the incision site. MAKE SURE YOU:   Understand these instructions.  Will watch your condition.  Will get help right away if you are not doing well or get worse. Document Released: 04/23/2007 Document Revised: 03/04/2012 Document Reviewed: 06/24/2007 [facility] Patient Information 2014 [place], [place].   ________________________________________________________________________

## 2020-12-06 ENCOUNTER — Encounter (HOSPITAL_COMMUNITY): Payer: Self-pay

## 2020-12-06 ENCOUNTER — Other Ambulatory Visit: Payer: Self-pay

## 2020-12-06 ENCOUNTER — Encounter (HOSPITAL_COMMUNITY)
Admission: RE | Admit: 2020-12-06 | Discharge: 2020-12-06 | Disposition: A | Discharge status: Home / Self Care | Payer: Medicare Other | Source: Ambulatory Visit | Attending: Orthopedic Surgery | Admitting: Orthopedic Surgery

## 2020-12-06 DIAGNOSIS — Z01812 Encounter for preprocedural laboratory examination: Secondary | ICD-10-CM | POA: Insufficient documentation

## 2020-12-06 HISTORY — DX: Essential (primary) hypertension: I10

## 2020-12-06 HISTORY — DX: Cerebral infarction, unspecified: I63.9

## 2020-12-06 LAB — SURGICAL PCR SCREEN
MRSA, PCR: NEGATIVE
Staphylococcus aureus: NEGATIVE

## 2020-12-06 LAB — BASIC METABOLIC PANEL
Anion gap: 9 (ref 5–15)
BUN: 26 mg/dL — ABNORMAL HIGH (ref 8–23)
CO2: 27 mmol/L (ref 22–32)
Calcium: 9.1 mg/dL (ref 8.9–10.3)
Chloride: 108 mmol/L (ref 98–111)
Creatinine, Ser: 1.32 mg/dL — ABNORMAL HIGH (ref 0.61–1.24)
GFR, Estimated: 55 mL/min — ABNORMAL LOW (ref >60)
Glucose, Bld: 74 mg/dL (ref 70–99)
Potassium: 4.3 mmol/L (ref 3.5–5.1)
Sodium: 144 mmol/L (ref 135–145)

## 2020-12-06 LAB — PROTIME-INR
INR: 1.3 — ABNORMAL HIGH (ref 0.8–1.2)
Prothrombin Time: 15.3 seconds — ABNORMAL HIGH (ref 11.4–15.2)

## 2020-12-06 LAB — CBC
HCT: 44.8 % (ref 39.0–52.0)
Hemoglobin: 13.8 g/dL (ref 13.0–17.0)
MCH: 31.4 pg (ref 26.0–34.0)
MCHC: 30.8 g/dL (ref 30.0–36.0)
MCV: 102.1 fL — ABNORMAL HIGH (ref 80.0–100.0)
Platelets: 180 10*3/uL (ref 150–400)
RBC: 4.39 MIL/uL (ref 4.22–5.81)
RDW: 13 % (ref 11.5–15.5)
WBC: 5 10*3/uL (ref 4.0–10.5)
nRBC: 0 % (ref 0.0–0.2)

## 2020-12-06 NOTE — Progress Notes (Addendum)
COVID Vaccine Completed: Yes Date COVID Vaccine completed: 10/2020. [place] COVID vaccine manufacturer:  Moderna    PCP - Dr. Asencion Noble Cardiologist - Dr. Candee Furbish. LOV: 10/04/20  Chest x-ray - CT chest: 10/26/20. EKG - 10/04/20 Stress Test -  ECHO -  Cardiac Cath -  Pacemaker/ICD device last checked:  Sleep Study -  CPAP -   Fasting Blood Sugar -  Checks Blood Sugar _____ times a day  Blood Thinner Instructions: Pt. And his wife were instructed to call MD. For instructions on Eliquis. Aspirin Instructions: Last Dose:  Anesthesia review: Hx: Afib,TIA's,HTN. Pt. Has difficulty walking do to hip pain.  Patient denies shortness of breath, fever, cough and chest pain at PAT appointment   Patient verbalized understanding of instructions that were given to them at the PAT appointment. Patient was also instructed that they will need to review over the PAT instructions again at home before surgery.

## 2020-12-06 NOTE — Progress Notes (Signed)
Lab. Results: PT: 15.3      INR:1.3.

## 2020-12-09 ENCOUNTER — Telehealth: Payer: Self-pay | Admitting: *Deleted

## 2020-12-09 MED ORDER — ENOXAPARIN SODIUM 150 MG/ML ~~LOC~~ SOLN: 150.0000 mg | SUBCUTANEOUS | 0 refills | Status: DC

## 2020-12-09 NOTE — Telephone Encounter (Signed)
I called and spoke with patient and his wife. Patient states that someone has shown him how to do lovenox injections and he feels comfortable with it. I offered for him to come to the office to review instructions but patient states he feels comfortable going over them on the phone.  12/17 Take Eliquis is the AM. No Eliquis is the PM. Inject lovenox in the belly at 10PM 12/18: No Eliquis, Inject lovenox in the belly at 10PM 12/19: No Eliquis, Inject lovenox in the belly at 10PM 12/20: No Eliquis, No lovenox 12/21: Procedure day. No lovenox, no eliquis Resume Eliquis as soon as surgeon deems safe  Both pt and his wife could repeat instructions back to me. I offered to send via mychart but wife declined.  Pt thought someone had already called rx into pharmacy. I sent in Rx for Enoxaparin 150mg  daily x 3 days. I spoke with pharmacy who does not have any but will be able to order it and have it tomorrow afternoon. Patient made aware.

## 2020-12-09 NOTE — Progress Notes (Signed)
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   Pt last seen by pulmonologist 11/16/2020. Per OV note, "For hip surgery, there is a low risk of postop pulmonary complications and is cleared for surgery. Eliquis should be handled perioperatively as would be routine for orthopedic surgery Early mobilization would be recommended"  Pt last seen by cardiology 10/04/20. Per OV note, "-Nuclear stress test reviewed from 2018 was low risk with no ischemia normal ejection fraction -He does have a thoracic aortic aneurysm that has been stable at 4.7 cm last checked a little over a year ago.  I will go ahead and order a CT scan of chest with contrast to monitor and to make sure that this is maintaining stability.  Obviously if the aneurysms were markedly enlarged, he would not go forward with his hip replacement.  If CT of thoracic aorta aneurysm is stable, he may proceed with hip replacement with moderate overall cardiac risk based mainly upon his age. -I have discussed his case with her pharmacy team.  If he ends up getting the go ahead, we would have him sit down with the pharmacy team to give a dose or 2 of Lovenox as a bridge."  Pt without Eliquis instructions at PAT visit.  Discussed with Dr. Marlou Porch office.   Addendum 12/07/2020:  Per pharmacy note 12/09/2020 pt to hold Eliquis 12/17 and begin Lovenox bridge.  Instructions given to pt.   Anticipate pt can proceed with planned procedure barring acute status change.   VS: BP (!) 150/65   Pulse 62   Temp 36.7 C (Oral)   Resp  18   Ht 6\' 1"  (1.854 m)   Wt 95.3 kg   SpO2 94%   BMI 27.71 kg/m   PROVIDERS: Asencion Noble, MD is PCP   Candee Furbish, MD is Cardiologist   Kara Mead, MD is Pulmonologist  LABS: Labs reviewed: Acceptable for surgery. (all labs ordered are listed, but only abnormal results are displayed)  Labs Reviewed  CBC - Abnormal; Notable for the following components:      Result Value   MCV 102.1 (*)    All other components within normal limits  PROTIME-INR - Abnormal; Notable for the following components:   Prothrombin Time 15.3 (*)    INR 1.3 (*)    All other components within normal limits  BASIC METABOLIC PANEL - Abnormal; Notable for the following components:   BUN 26 (*)    Creatinine, Ser 1.32 (*)    GFR, Estimated 55 (*)    All other components within normal limits  SURGICAL PCR SCREEN     IMAGES:   EKG: 10/04/20 Rate 63 bpm  SR  CV: Stress Test 06/08/2017   Nuclear stress EF: 66%.  There was no ST segment deviation noted during stress.  The study is normal.  This is a low risk study.  The left ventricular ejection fraction is hyperdynamic (>65%).   Thinning of the inferior wall at mid and basal level thought to be from extra cardiac /diaphragmatic attenuation No ischemia and no RWMA;s  EF 66  Echo 07/27/2015 Study Conclusions   - Left ventricle: Systolic function was normal. The estimated  ejection fraction was in the range of 55% to 60%. Wall motion was  normal; there were no regional wall motion abnormalities.  - Aortic valve: No evidence of vegetation. There was mild  regurgitation.  - Ascending aorta: The ascending aorta was mildly dilated.  - Mitral valve: No evidence of vegetation. There was mild  regurgitation.  - Left atrium: No evidence of thrombus in the atrial cavity or  appendage.  - Right atrium: No evidence of thrombus in the atrial cavity or  appendage.  - Atrial septum: No defect or patent foramen ovale was identified.   - Tricuspid valve: No evidence of vegetation. There was  mild-moderate regurgitation.  - Pulmonic valve: No evidence of vegetation.   Impressions:   - Normal LV function; mild AI and MR; mild to moderate TR; mildly  dilated ascending aorta (4.2 cm); mild to moderate  atherosclerosis descending aorta; negative saline microcavitation  study.  Past Medical History:  Diagnosis Date  . Arthritis    DJD right knee  . Benign prostatic hypertrophy with urinary frequency   . Bladder cancer ([place])   . Complication of anesthesia    gets combative in recovery  . COPD with emphysema ([place])   . Depression   . Dyspnea    when walking  . Dysrhythmia    a fib  . History of colon polyps   . History of kidney stones   . History of loop recorder    loop recorder in place battery is dead has not had changed due to covid  . History of pneumonia    hx Recurrent -- last bout Dec 2016 CAP-- per pt resolved  . 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 Hyperlipidemia   . Hypertension   . 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 Nephrolithiasis    right non-obstructive  per CT 05-02-2016  . Ocular migraine    controlled w/ verapamil  . PAF (paroxysmal atrial fibrillation) ([place])    followed by AFIB clinic-- cardiologist-- dr Marlou Porch  . Pneumonia    history of pnu.  . Pulmonary nodule, left    left lower lobe x2 per CT 01-20-2016  . Renal cyst, left   . Stroke ([place])    TIA's  . Thoracic aortic aneurysm without rupture ([place])    ascending -- ct chest 01/10/16  4.8cm  . Wears hearing aid    bilateral-- wear at times    Past Surgical History:  Procedure Laterality Date  . ANTERIOR CERVICAL DECOMP/DISCECTOMY  FUSION N/A 04/03/2017   Procedure: Cervical three-four, Cervical four-five Anterior cervical decompression/discectomy/fusion;  Surgeon: Erline Levine, MD;  Location: [facility];  Service: Neurosurgery;  Laterality: N/A;  . CATARACT EXTRACTION W/ INTRAOCULAR LENS IMPLANT Right 2016  . CATARACT EXTRACTION W/PHACO  12/16/2012   Procedure: CATARACT EXTRACTION PHACO AND INTRAOCULAR LENS PLACEMENT (IOC);  Surgeon: Tonny Branch, MD;  Location: [facility];  Service: Ophthalmology;  Laterality: Left;  CDE:17.31  . COLONOSCOPY  10/03/2011   Procedure: COLONOSCOPY;  Surgeon: Jamesetta So;  [facility];  Service: Gastroenterology;  Laterality: N/A;  . CYSTOSCOPY/RETROGRADE/URETEROSCOPY/STONE EXTRACTION WITH BASKET Right 03/29/2020   Procedure:  CYSTOSCOPY/RETROGRADE/URETEROSCOPY/STONE EXTRACTION WITH BASKET;  Surgeon: Franchot Gallo, MD;  [facility];  Service: Urology;  Laterality: Right;  1 HR  . DECOMPRESSIOIN ULNAR NERVE AND CUBITAL TUNNEL RELEASE Left 07-14-2009   elbow  . 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 HOLMIUM LASER APPLICATION Right 06/01/3418   Procedure: HOLMIUM LASER APPLICATION;  Surgeon: Franchot Gallo, MD;  [facility];  Service: Urology;  Laterality: Right;  . INGUINAL HERNIA REPAIR Right 1990's  . KNEE ARTHROSCOPY Right 2015  . LEFT CUBITAL TUNNEL RELEASE    . POSTERIOR LUMBAR FUSION  2014   X2  . SHOULDER ARTHROSCOPY Left 1990's  . TEE WITHOUT CARDIOVERSION N/A 07/27/2015   Procedure: TRANSESOPHAGEAL ECHOCARDIOGRAM (TEE);  Surgeon: Lelon Perla, MD;  Location: [facility] ENDOSCOPY;  Service: Cardiovascular;  Laterality: N/A;   normal LV function, ef 55-60%,  mild AR and MR, mild dilated ascending aorta (4.2cm),  mild to moderate atherosclerosis  descending aorta,  mild to moderate TR,  negative saline microcavitation study  . THYROIDECTOMY Right 01/20/2015   Procedure: RIGHT THYROIDECTOMY;  Surgeon: Ascencion Dike, MD;   Location: [facility];  Service: ENT;  Laterality: Right;  . TONSILLECTOMY  as child  . TRANSURETHRAL RESECTION OF BLADDER TUMOR N/A 05/15/2016   Procedure: TRANSURETHRAL RESECTION OF BLADDER TUMOR (TURBT) AND INSTILLATION OF EPIRUBICIN;  Surgeon: Franchot Gallo, MD;  Location: [facility];  Service: Urology;  Laterality: N/A;  . TRANSURETHRAL RESECTION OF BLADDER TUMOR N/A 03/29/2020   Procedure: TRANSURETHRAL RESECTION OF BLADDER TUMOR (TURBT);  Surgeon: Franchot Gallo, MD;  [facility];  Service: Urology;  Laterality: N/A;    MEDICATIONS: . acetaminophen (TYLENOL) 500 MG tablet  . atorvastatin (LIPITOR) 20 MG tablet  . budesonide-formoterol (SYMBICORT) 160-4.5 MCG/ACT inhaler  . ELIQUIS 5 MG TABS tablet  . finasteride (PROSCAR) 5 MG tablet  . loratadine (CLARITIN) 10 MG tablet  . Magnesium 250 MG TABS  . OVER THE COUNTER MEDICATION  . sertraline (ZOLOFT) 50 MG tablet  . VENTOLIN HFA 108 (90 BASE) MCG/ACT inhaler  . verapamil (CALAN-SR) 240 MG CR tablet   No current facility-administered medications for this encounter.     Konrad Felix, PA-C [facility] Pre-Surgical Testing [phone]

## 2020-12-09 NOTE — Telephone Encounter (Signed)
Received call from [place] at [facility] pre-op.  Pt was seen by Dr Marlou Porch in 09/2020 and advised is CT of chest demonstrated his thoracic aortic aneurysm was stable, pt could move forward with overall moderate risk with hip replacement.  CT results are stable.  He had also advised pt would need a Lovenox bridge.  Pt takes Eliquis for At Fib.  Will forward to anti-coag clinic for further advise and f/u.

## 2020-12-10 ENCOUNTER — Other Ambulatory Visit (HOSPITAL_COMMUNITY)
Admission: RE | Admit: 2020-12-10 | Discharge: 2020-12-10 | Disposition: A | Discharge status: Home / Self Care | Payer: Medicare Other | Source: Ambulatory Visit | Attending: Orthopedic Surgery | Admitting: Orthopedic Surgery

## 2020-12-10 DIAGNOSIS — Z01812 Encounter for preprocedural laboratory examination: Secondary | ICD-10-CM | POA: Diagnosis not present

## 2020-12-10 DIAGNOSIS — Z20822 Contact with and (suspected) exposure to covid-19: Secondary | ICD-10-CM | POA: Insufficient documentation

## 2020-12-10 LAB — SARS CORONAVIRUS 2 (TAT 6-24 HRS): SARS Coronavirus 2: NEGATIVE

## 2020-12-10 NOTE — Anesthesia Preprocedure Evaluation (Addendum)
Anesthesia Evaluation  Patient identified by MRN, date of birth, ID band Patient awake    Reviewed: Allergy & Precautions, NPO status , [name] Chart, lab work & pertinent test results  Airway Mallampati: II       Dental  (+) Chipped, Poor Dentition,    Pulmonary former smoker,    Pulmonary exam normal        Cardiovascular hypertension, Pt. on medications Normal cardiovascular exam+ dysrhythmias Atrial Fibrillation  Rhythm:Regular Rate:Normal     Neuro/Psych  Headaches, PSYCHIATRIC DISORDERS Depression CVA    GI/Hepatic negative GI ROS,   Endo/Other  negative endocrine ROS  Renal/GU Renal InsufficiencyRenal disease  negative genitourinary   Musculoskeletal   Abdominal Normal abdominal exam  (+)   Peds  Hematology negative hematology ROS (+)   Anesthesia Other Findings   Reproductive/Obstetrics                                                             Anesthesia Evaluation  Patient identified by MRN, date of birth, ID band Patient awake    Reviewed: Allergy & Precautions, H&P , NPO status , [name] Chart, lab work & pertinent test results, reviewed documented beta blocker date and time   History of Anesthesia Complications (+) history of anesthetic complications  Airway Mallampati: II  TM Distance: >3 FB Neck ROM: full    Dental no notable dental hx. (+) Poor Dentition, Chipped, Missing, Caps,    Pulmonary COPD, former smoker,    Pulmonary exam normal breath sounds clear to auscultation       Cardiovascular Exercise Tolerance: Good + dysrhythmias Atrial Fibrillation  Rhythm:regular Rate:Normal  EKG: 10/28/2019 Rate 69 bpm  Normal sinus rhythm   CV: Myocardial Perfusion 06/08/2017 Nuclear stress EF: 66%. There was no ST segment deviation noted during stress. The study is normal. This is a low risk study. The left ventricular ejection fraction is  hyperdynamic (>65%).  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 Bladder cancer TIA   GI/Hepatic negative GI ROS, Neg liver ROS,   Endo/Other  negative endocrine ROS  Renal/GU Renal disease  negative genitourinary   Musculoskeletal  (+) Arthritis , Osteoarthritis,    Abdominal   Peds  Hematology negative hematology ROS (+)   Anesthesia Other Findings   Reproductive/Obstetrics negative OB ROS                           Anesthesia Physical Anesthesia Plan  ASA: III  Anesthesia Plan: General   Post-op Pain Management:    Induction: Intravenous  PONV Risk Score and Plan: 2 and Ondansetron  Airway Management Planned: LMA and Oral ETT  Additional Equipment:   Intra-op Plan:   Post-operative Plan: Extubation in OR  Informed  Consent: I have reviewed the patients History and Physical, chart, labs and discussed the procedure including the risks, benefits and alternatives for the proposed anesthesia with the patient or authorized representative who has indicated his/her understanding and acceptance.     Dental Advisory Given  Plan Discussed with: CRNA  Anesthesia Plan Comments: (See PAT note 03/23/2020, Konrad Felix, PA-C)       Anesthesia Quick Evaluation  Anesthesia Physical Anesthesia Plan  ASA: III  Anesthesia Plan: Spinal   Post-op Pain  Management:    Induction:   PONV Risk Score and Plan:   Airway Management Planned: Natural Airway and Simple Face Mask  Additional Equipment: None  Intra-op Plan:   Post-operative Plan:   Informed Consent: I have reviewed the patients History and Physical, chart, labs and discussed the procedure including the risks, benefits and alternatives for the proposed anesthesia with the patient or authorized representative who has indicated his/her understanding and acceptance.       Plan Discussed with: CRNA  Anesthesia Plan Comments: (See PAT note 12/06/2020, Konrad Felix, PA-C)       Anesthesia Quick Evaluation

## 2020-12-14 ENCOUNTER — Other Ambulatory Visit: Payer: Self-pay

## 2020-12-14 ENCOUNTER — Ambulatory Visit (HOSPITAL_COMMUNITY): Payer: Medicare Other

## 2020-12-14 ENCOUNTER — Observation Stay (HOSPITAL_COMMUNITY)
Admission: RE | Admit: 2020-12-14 | Discharge: 2020-12-15 | Disposition: A | Discharge status: Home / Self Care | Payer: Medicare Other | Attending: Orthopedic Surgery | Admitting: Orthopedic Surgery

## 2020-12-14 ENCOUNTER — Encounter (HOSPITAL_COMMUNITY): Payer: Self-pay | Admitting: Orthopedic Surgery

## 2020-12-14 ENCOUNTER — Encounter (HOSPITAL_COMMUNITY)
Admission: RE | Disposition: A | Discharge status: Home / Self Care | Payer: Self-pay | Source: Home / Self Care | Attending: Orthopedic Surgery

## 2020-12-14 ENCOUNTER — Ambulatory Visit (HOSPITAL_COMMUNITY): Payer: Medicare Other | Admitting: Certified Registered"

## 2020-12-14 ENCOUNTER — Ambulatory Visit (HOSPITAL_COMMUNITY): Payer: Medicare Other | Admitting: Physician Assistant

## 2020-12-14 DIAGNOSIS — I10 Essential (primary) hypertension: Secondary | ICD-10-CM | POA: Insufficient documentation

## 2020-12-14 DIAGNOSIS — I480 Paroxysmal atrial fibrillation: Secondary | ICD-10-CM | POA: Diagnosis not present

## 2020-12-14 DIAGNOSIS — Z79899 Other long term (current) drug therapy: Secondary | ICD-10-CM | POA: Diagnosis not present

## 2020-12-14 DIAGNOSIS — Z7901 Long term (current) use of anticoagulants: Secondary | ICD-10-CM | POA: Insufficient documentation

## 2020-12-14 DIAGNOSIS — J439 Emphysema, unspecified: Secondary | ICD-10-CM | POA: Diagnosis not present

## 2020-12-14 DIAGNOSIS — Z96649 Presence of unspecified artificial hip joint: Secondary | ICD-10-CM

## 2020-12-14 DIAGNOSIS — M1612 Unilateral primary osteoarthritis, left hip: Secondary | ICD-10-CM | POA: Diagnosis not present

## 2020-12-14 DIAGNOSIS — Z419 Encounter for procedure for purposes other than remedying health state, unspecified: Secondary | ICD-10-CM

## 2020-12-14 DIAGNOSIS — Z87891 Personal history of nicotine dependence: Secondary | ICD-10-CM | POA: Diagnosis not present

## 2020-12-14 DIAGNOSIS — Z471 Aftercare following joint replacement surgery: Secondary | ICD-10-CM | POA: Diagnosis not present

## 2020-12-14 DIAGNOSIS — J449 Chronic obstructive pulmonary disease, unspecified: Secondary | ICD-10-CM | POA: Insufficient documentation

## 2020-12-14 DIAGNOSIS — M25552 Pain in left hip: Secondary | ICD-10-CM | POA: Diagnosis present

## 2020-12-14 DIAGNOSIS — Z96642 Presence of left artificial hip joint: Secondary | ICD-10-CM | POA: Diagnosis not present

## 2020-12-14 HISTORY — PX: TOTAL HIP ARTHROPLASTY: SHX124

## 2020-12-14 SURGERY — ARTHROPLASTY, HIP, TOTAL, ANTERIOR APPROACH
Anesthesia: Spinal | Site: Hip | Laterality: Left

## 2020-12-14 MED ORDER — EPHEDRINE SULFATE-NACL 50-0.9 MG/10ML-% IV SOSY
PREFILLED_SYRINGE | INTRAVENOUS | Status: DC | PRN
  Administered 2020-12-14: 5 mg via INTRAVENOUS
  Administered 2020-12-14 (×2): 10 mg via INTRAVENOUS
  Administered 2020-12-14: 5 mg via INTRAVENOUS

## 2020-12-14 MED ORDER — LACTATED RINGERS IV SOLN: INTRAVENOUS | Status: DC

## 2020-12-14 MED ORDER — ONDANSETRON HCL 4 MG/2ML IJ SOLN
INTRAMUSCULAR | Status: DC | PRN
  Administered 2020-12-14: 4 mg via INTRAVENOUS

## 2020-12-14 MED ORDER — PROMETHAZINE HCL 25 MG/ML IJ SOLN: 12.5000 mg | Freq: Once | INTRAMUSCULAR | Status: DC | PRN

## 2020-12-14 MED ORDER — DEXAMETHASONE SODIUM PHOSPHATE 10 MG/ML IJ SOLN
INTRAMUSCULAR | Status: DC | PRN
  Administered 2020-12-14: 4 mg via INTRAVENOUS

## 2020-12-14 MED ORDER — BISACODYL 10 MG RE SUPP: 10.0000 mg | Freq: Every day | RECTAL | Status: DC | PRN

## 2020-12-14 MED ORDER — MAGNESIUM OXIDE 400 (241.3 MG) MG PO TABS: 200.0000 mg | ORAL_TABLET | Freq: Every day | ORAL | Status: DC

## 2020-12-14 MED ORDER — PANTOPRAZOLE SODIUM 40 MG PO TBEC: 40.0000 mg | DELAYED_RELEASE_TABLET | Freq: Every day | ORAL | Status: DC

## 2020-12-14 MED ORDER — LORATADINE 10 MG PO TABS: 10.0000 mg | ORAL_TABLET | Freq: Every day | ORAL | Status: DC | PRN

## 2020-12-14 MED ORDER — ACETAMINOPHEN 10 MG/ML IV SOLN: 1000.0000 mg | Freq: Once | INTRAVENOUS | Status: DC | PRN

## 2020-12-14 MED ORDER — VERAPAMIL HCL ER 240 MG PO TBCR
240.0000 mg | EXTENDED_RELEASE_TABLET | Freq: Every day | ORAL | Status: DC
  Administered 2020-12-14: 240 mg via ORAL
  Filled 2020-12-14 (×2): qty 1

## 2020-12-14 MED ORDER — ONDANSETRON HCL 4 MG/2ML IJ SOLN: 4.0000 mg | Freq: Four times a day (QID) | INTRAMUSCULAR | Status: DC | PRN

## 2020-12-14 MED ORDER — SENNOSIDES-DOCUSATE SODIUM 8.6-50 MG PO TABS: 1.0000 | ORAL_TABLET | Freq: Every evening | ORAL | Status: DC | PRN

## 2020-12-14 MED ORDER — HYDROMORPHONE HCL 1 MG/ML IJ SOLN
0.5000 mg | INTRAMUSCULAR | Status: DC | PRN
Start: 2020-12-14 — End: 2020-12-15

## 2020-12-14 MED ORDER — MENTHOL 3 MG MT LOZG: 1.0000 | LOZENGE | OROMUCOSAL | Status: DC | PRN

## 2020-12-14 MED ORDER — APIXABAN 5 MG PO TABS: 5.0000 mg | ORAL_TABLET | Freq: Two times a day (BID) | ORAL | Status: DC

## 2020-12-14 MED ORDER — PROPOFOL 500 MG/50ML IV EMUL
INTRAVENOUS | Status: AC
  Filled 2020-12-14: qty 50

## 2020-12-14 MED ORDER — DEXAMETHASONE SODIUM PHOSPHATE 10 MG/ML IJ SOLN
INTRAMUSCULAR | Status: AC
  Filled 2020-12-14: qty 1

## 2020-12-14 MED ORDER — DIPHENHYDRAMINE HCL 12.5 MG/5ML PO ELIX: 12.5000 mg | ORAL_SOLUTION | ORAL | Status: DC | PRN

## 2020-12-14 MED ORDER — LIDOCAINE 2% (20 MG/ML) 5 ML SYRINGE
INTRAMUSCULAR | Status: DC | PRN
  Administered 2020-12-14: 40 mg via INTRAVENOUS

## 2020-12-14 MED ORDER — DOCUSATE SODIUM 100 MG PO CAPS
100.0000 mg | ORAL_CAPSULE | Freq: Two times a day (BID) | ORAL | Status: DC
  Administered 2020-12-14: 100 mg via ORAL
  Filled 2020-12-14: qty 1

## 2020-12-14 MED ORDER — TRANEXAMIC ACID-NACL 1000-0.7 MG/100ML-% IV SOLN
1000.0000 mg | INTRAVENOUS | Status: AC
  Administered 2020-12-14: 1000 mg via INTRAVENOUS
  Filled 2020-12-14: qty 100

## 2020-12-14 MED ORDER — ALBUTEROL SULFATE HFA 108 (90 BASE) MCG/ACT IN AERS: 2.0000 | INHALATION_SPRAY | RESPIRATORY_TRACT | Status: DC | PRN

## 2020-12-14 MED ORDER — ATORVASTATIN CALCIUM 20 MG PO TABS
20.0000 mg | ORAL_TABLET | Freq: Every day | ORAL | Status: DC
Start: 2020-12-15 — End: 2020-12-15

## 2020-12-14 MED ORDER — BUPIVACAINE IN DEXTROSE 0.75-8.25 % IT SOLN
INTRATHECAL | Status: DC | PRN
  Administered 2020-12-14: 1.6 mL via INTRATHECAL

## 2020-12-14 MED ORDER — EPHEDRINE 5 MG/ML INJ
INTRAVENOUS | Status: AC
  Filled 2020-12-14: qty 10

## 2020-12-14 MED ORDER — METOCLOPRAMIDE HCL 5 MG PO TABS: 5.0000 mg | ORAL_TABLET | Freq: Three times a day (TID) | ORAL | Status: DC | PRN

## 2020-12-14 MED ORDER — ONDANSETRON HCL 4 MG PO TABS: 4.0000 mg | ORAL_TABLET | Freq: Four times a day (QID) | ORAL | Status: DC | PRN

## 2020-12-14 MED ORDER — METOCLOPRAMIDE HCL 5 MG/ML IJ SOLN: 5.0000 mg | Freq: Three times a day (TID) | INTRAMUSCULAR | Status: DC | PRN

## 2020-12-14 MED ORDER — FINASTERIDE 5 MG PO TABS
5.0000 mg | ORAL_TABLET | Freq: Every day | ORAL | Status: DC
  Administered 2020-12-14: 5 mg via ORAL
  Filled 2020-12-14: qty 1

## 2020-12-14 MED ORDER — SODIUM CHLORIDE FLUSH 0.9 % IV SOLN
INTRAVENOUS | Status: DC | PRN
  Administered 2020-12-14: 20 mL via INTRAVENOUS

## 2020-12-14 MED ORDER — ONDANSETRON HCL 4 MG/2ML IJ SOLN
INTRAMUSCULAR | Status: AC
  Filled 2020-12-14: qty 2

## 2020-12-14 MED ORDER — PROPOFOL 10 MG/ML IV BOLUS
INTRAVENOUS | Status: DC | PRN
  Administered 2020-12-14: 20 mg via INTRAVENOUS
  Administered 2020-12-14 (×2): 10 mg via INTRAVENOUS

## 2020-12-14 MED ORDER — SODIUM CHLORIDE (PF) 0.9 % IJ SOLN
INTRAMUSCULAR | Status: AC
  Filled 2020-12-14: qty 20

## 2020-12-14 MED ORDER — SERTRALINE HCL 50 MG PO TABS
50.0000 mg | ORAL_TABLET | Freq: Every morning | ORAL | Status: DC
Start: 2020-12-15 — End: 2020-12-15

## 2020-12-14 MED ORDER — PHENOL 1.4 % MT LIQD: 1.0000 | OROMUCOSAL | Status: DC | PRN

## 2020-12-14 MED ORDER — HYDROMORPHONE HCL 1 MG/ML IJ SOLN: 0.2500 mg | INTRAMUSCULAR | Status: DC | PRN

## 2020-12-14 MED ORDER — CEFAZOLIN SODIUM-DEXTROSE 2-4 GM/100ML-% IV SOLN
2.0000 g | INTRAVENOUS | Status: AC
  Administered 2020-12-14: 2 g via INTRAVENOUS
  Filled 2020-12-14: qty 100

## 2020-12-14 MED ORDER — ACETAMINOPHEN 500 MG PO TABS: 500.0000 mg | ORAL_TABLET | ORAL | Status: DC

## 2020-12-14 MED ORDER — OXYCODONE HCL 5 MG PO TABS
5.0000 mg | ORAL_TABLET | ORAL | Status: DC | PRN
  Administered 2020-12-14: 10 mg via ORAL
  Administered 2020-12-14 – 2020-12-15 (×2): 5 mg via ORAL
  Filled 2020-12-14: qty 1
  Filled 2020-12-14 (×2): qty 2

## 2020-12-14 MED ORDER — PHENYLEPHRINE HCL (PRESSORS) 10 MG/ML IV SOLN
INTRAVENOUS | Status: AC
  Filled 2020-12-14: qty 1

## 2020-12-14 MED ORDER — 0.9 % SODIUM CHLORIDE (POUR BTL) OPTIME
TOPICAL | Status: DC | PRN
  Administered 2020-12-14: 1000 mL

## 2020-12-14 MED ORDER — PHENYLEPHRINE HCL-NACL 10-0.9 MG/250ML-% IV SOLN
INTRAVENOUS | Status: DC | PRN
  Administered 2020-12-14: 20 ug/min via INTRAVENOUS

## 2020-12-14 MED ORDER — TRANEXAMIC ACID-NACL 1000-0.7 MG/100ML-% IV SOLN
1000.0000 mg | Freq: Once | INTRAVENOUS | Status: AC
  Administered 2020-12-14: 1000 mg via INTRAVENOUS
  Filled 2020-12-14: qty 100

## 2020-12-14 MED ORDER — PROPOFOL 1000 MG/100ML IV EMUL
INTRAVENOUS | Status: AC
  Filled 2020-12-14: qty 100

## 2020-12-14 MED ORDER — BUPIVACAINE LIPOSOME 1.3 % IJ SUSP
10.0000 mL | Freq: Once | INTRAMUSCULAR | Status: AC
  Administered 2020-12-14: 10 mL
  Filled 2020-12-14: qty 10

## 2020-12-14 MED ORDER — WATER FOR IRRIGATION, STERILE IR SOLN
Status: DC | PRN
  Administered 2020-12-14: 2000 mL

## 2020-12-14 MED ORDER — ACETAMINOPHEN 500 MG PO TABS
1000.0000 mg | ORAL_TABLET | Freq: Four times a day (QID) | ORAL | Status: DC
  Administered 2020-12-14 – 2020-12-15 (×3): 1000 mg via ORAL
  Filled 2020-12-14 (×3): qty 2

## 2020-12-14 MED ORDER — ORAL CARE MOUTH RINSE: 15.0000 mL | Freq: Once | OROMUCOSAL | Status: AC

## 2020-12-14 MED ORDER — MOMETASONE FURO-FORMOTEROL FUM 200-5 MCG/ACT IN AERO
2.0000 | INHALATION_SPRAY | Freq: Two times a day (BID) | RESPIRATORY_TRACT | Status: DC
  Filled 2020-12-14: qty 8.8

## 2020-12-14 MED ORDER — OXYCODONE HCL 5 MG PO TABS: 10.0000 mg | ORAL_TABLET | ORAL | Status: DC | PRN

## 2020-12-14 MED ORDER — CHLORHEXIDINE GLUCONATE 0.12 % MT SOLN
15.0000 mL | Freq: Once | OROMUCOSAL | Status: AC
  Administered 2020-12-14: 15 mL via OROMUCOSAL

## 2020-12-14 MED ORDER — CEFAZOLIN SODIUM-DEXTROSE 2-4 GM/100ML-% IV SOLN
2.0000 g | Freq: Four times a day (QID) | INTRAVENOUS | Status: AC
Start: 2020-12-14 — End: 2020-12-14
  Administered 2020-12-14 (×2): 2 g via INTRAVENOUS
  Filled 2020-12-14 (×2): qty 100

## 2020-12-14 MED ORDER — PROPOFOL 500 MG/50ML IV EMUL
INTRAVENOUS | Status: DC | PRN
  Administered 2020-12-14: 50 ug/kg/min via INTRAVENOUS

## 2020-12-14 SURGICAL SUPPLY — 46 items
APL PRP STRL LF DISP 70% ISPRP (MISCELLANEOUS) ×1
BAG SPEC THK2 15X12 ZIP CLS (MISCELLANEOUS)
BAG ZIPLOCK 12X15 (MISCELLANEOUS) IMPLANT
BLADE SAG 18X100X1.27 (BLADE) ×2 IMPLANT
BLADE SURG SZ10 CARB STEEL (BLADE) IMPLANT
CHLORAPREP W/TINT 26 (MISCELLANEOUS) ×2 IMPLANT
CLSR STERI-STRIP ANTIMIC 1/2X4 (GAUZE/BANDAGES/DRESSINGS) ×2 IMPLANT
COVER PERINEAL POST (MISCELLANEOUS) ×2 IMPLANT
COVER SURGICAL LIGHT HANDLE (MISCELLANEOUS) ×2 IMPLANT
COVER WAND RF STERILE (DRAPES) IMPLANT
DECANTER SPIKE VIAL GLASS SM (MISCELLANEOUS) ×4 IMPLANT
DRAPE IMP U-DRAPE 54X76 (DRAPES) ×2 IMPLANT
DRAPE STERI IOBAN 125X83 (DRAPES) ×2 IMPLANT
DRAPE U-SHAPE 47X51 STRL (DRAPES) ×4 IMPLANT
DRSG MEPILEX BORDER 4X8 (GAUZE/BANDAGES/DRESSINGS) ×2 IMPLANT
ELECT REM PT RETURN 15FT ADLT (MISCELLANEOUS) ×2 IMPLANT
GLOVE BIO SURGEON STRL SZ7.5 (GLOVE) ×4 IMPLANT
GLOVE BIOGEL PI IND STRL 7.5 (GLOVE) ×1 IMPLANT
GLOVE BIOGEL PI IND STRL 8 (GLOVE) ×1 IMPLANT
GLOVE BIOGEL PI INDICATOR 7.5 (GLOVE) ×1
GLOVE BIOGEL PI INDICATOR 8 (GLOVE) ×1
GOWN STRL REUS W/TWL LRG LVL3 (GOWN DISPOSABLE) ×2 IMPLANT
GOWN STRL REUS W/TWL XL LVL3 (GOWN DISPOSABLE) ×2 IMPLANT
HEAD BIOLOX HIP 36/-5 (Joint) IMPLANT
HIP BIOLOX HD 36/-5 (Joint) ×2 IMPLANT
HOLDER FOLEY CATH W/STRAP (MISCELLANEOUS) ×1 IMPLANT
INSERT TRIDENT POLY 36 0DEG (Insert) ×1 IMPLANT
KIT TURNOVER KIT A (KITS) IMPLANT
MANIFOLD NEPTUNE II (INSTRUMENTS) ×2 IMPLANT
NS IRRIG 1000ML POUR BTL (IV SOLUTION) ×2 IMPLANT
PACK ANTERIOR HIP CUSTOM (KITS) ×2 IMPLANT
PROTECTOR NERVE ULNAR (MISCELLANEOUS) ×2 IMPLANT
SCREW HEX LP 6.5X20 (Screw) ×2 IMPLANT
SHELL ACETABUL CLUSTER SZ 54 (Shell) ×1 IMPLANT
STEM ACCOLADE II SZ8 (Stem) ×1 IMPLANT
STRIP CLOSURE SKIN 1/2X4 (GAUZE/BANDAGES/DRESSINGS) ×1 IMPLANT
SUT MNCRL AB 3-0 PS2 18 (SUTURE) ×2 IMPLANT
SUT STRATAFIX 0 PDS 27 VIOLET (SUTURE) ×2
SUT VIC AB 0 CT1 36 (SUTURE) ×2 IMPLANT
SUT VIC AB 1 CT1 36 (SUTURE) ×2 IMPLANT
SUT VIC AB 2-0 CT1 27 (SUTURE) ×4
SUT VIC AB 2-0 CT1 TAPERPNT 27 (SUTURE) ×2 IMPLANT
SUTURE STRATFX 0 PDS 27 VIOLET (SUTURE) ×1 IMPLANT
TRAY FOLEY MTR SLVR 16FR STAT (SET/KITS/TRAYS/PACK) ×1 IMPLANT
TUBE SUCTION HIGH CAP CLEAR NV (SUCTIONS) ×2 IMPLANT
WATER STERILE IRR 1000ML POUR (IV SOLUTION) ×4 IMPLANT

## 2020-12-14 NOTE — Interval H&P Note (Signed)
History and Physical Interval Note:  12/14/2020 7:40 AM  Kyle Owen  has presented today for surgery, with the diagnosis of OA LEFT HIP.  The various methods of treatment have been discussed with the patient and family. After consideration of risks, benefits and other options for treatment, the patient has consented to  Procedure(s): TOTAL HIP ARTHROPLASTY ANTERIOR APPROACH (Left) as a surgical intervention.  The patient's history has been reviewed, patient examined, no change in status, stable for surgery.  I have reviewed the patient's chart and labs.  Questions were answered to the patient's satisfaction.     [name]

## 2020-12-14 NOTE — Anesthesia Postprocedure Evaluation (Signed)
Anesthesia Post Note  Patient: Kyle Owen  Procedure(s) Performed: TOTAL HIP ARTHROPLASTY ANTERIOR APPROACH (Left Hip)     Patient location during evaluation: PACU Anesthesia Type: Spinal Level of consciousness: awake Pain management: pain level controlled Vital Signs Assessment: post-procedure vital signs reviewed and stable Respiratory status: spontaneous breathing Cardiovascular status: stable Postop Assessment: no headache, no backache, spinal receding and no apparent nausea or vomiting Anesthetic complications: no   No complications documented.  Last Vitals:  Vitals:   12/14/20 1228 12/14/20 1230  BP:    Pulse: 63 (!) 56  Resp: 16 16  Temp: 36.4 C   SpO2: 99% 97%    Last Pain:  Vitals:   12/14/20 0806  TempSrc: Oral  PainSc:                  Huston Foley

## 2020-12-14 NOTE — Op Note (Signed)
12/14/2020  11:44 AM  PATIENT:  ICHOLAS Owen   MRN: [id number]  PRE-OPERATIVE DIAGNOSIS:  OA LEFT HIP  POST-OPERATIVE DIAGNOSIS:  OA LEFT HIP  PROCEDURE:  Procedure(s): TOTAL HIP ARTHROPLASTY ANTERIOR APPROACH  PREOPERATIVE INDICATIONS:    [name] is an 79 y.o. male who has a diagnosis of <principal problem not specified> and elected for surgical management after failing conservative treatment.  The risks benefits and alternatives were discussed with the patient including but not limited to the risks of nonoperative treatment, versus surgical intervention including infection, bleeding, nerve injury, periprosthetic fracture, the need for revision surgery, dislocation, leg length discrepancy, blood clots, cardiopulmonary complications, morbidity, mortality, among others, and they were willing to proceed.     OPERATIVE REPORT     SURGEON:   [name], MD    ASSISTANT:  Margy Clarks, PA-C, he was present and scrubbed throughout the case, critical for completion in a timely fashion, and for retraction, instrumentation, and closure.     ANESTHESIA:  General    COMPLICATIONS:  None.     COMPONENTS:  [employer]ker acolade fit femur size 8 with a 36 mm -5 head ball and an acetabular shell size 54 with a  polyethylene liner    PROCEDURE IN DETAIL:   The patient was met in the holding area and  identified.  The appropriate hip was identified and marked at the operative site.  The patient was then transported to the OR  and  placed under anesthesia per that record.  At that point, the patient was  placed in the supine position and  secured to the operating room table and all bony prominences padded. He received pre-operative antibiotics    The operative lower extremity was prepped from the iliac crest to the distal leg.  Sterile draping was performed.  Time out was performed prior to incision.      Skin incision was made just 2 cm lateral to the ASIS  extending in line with  the tensor fascia lata. Electrocautery was used to control all bleeders. I dissected down sharply to the fascia of the tensor fascia lata was confirmed that the muscle fibers beneath were running posteriorly. I then incised the fascia over the superficial tensor fascia lata in line with the incision. The fascia was elevated off the anterior aspect of the muscle the muscle was retracted posteriorly and protected throughout the case. I then used electrocautery to incise the tensor fascia lata fascia control and all bleeders. Immediately visible was the fat over top of the anterior neck and capsule.  I removed the anterior fat from the capsule and elevated the rectus muscle off of the anterior capsule. I then removed a large time of capsule. The retractors were then placed over the anterior acetabulum as well as around the superior and inferior neck.  I then made a femoral neck cut. Then used the power corkscrew to remove the femoral head from the acetabulum and thoroughly irrigated the acetabulum. I sized the femoral head.    I then exposed the deep acetabulum, cleared out any tissue including the ligamentum teres.   After adequate visualization, I excised the labrum, and then sequentially reamed.  I then impacted the acetabular implant into place using fluoroscopy for guidance.  Appropriate version and inclination was confirmed clinically matching their bony anatomy, and with fluoroscopy.  I placed a 20 mm screw in the posterior/superio position with an excellent bite.    I then placed the polyethylene liner in  place  I then adducted the leg and released the external rotators from the posterior femur allowing it to be easily delivered up lateral and anterior to the acetabulum for preparation of the femoral canal.    I then prepared the proximal femur using the cookie-cutter and then sequentially reamed and broached.  A trial broach, neck, and head was utilized, and I reduced the hip and used  floroscopy to assess the neck length and femoral implant.  I then impacted the femoral prosthesis into place into the appropriate version. The hip was then reduced and fluoroscopy confirmed appropriate position. Leg lengths were restored.  I then irrigated the hip copiously again with, and repaired the fascia with Vicryl, followed by monocryl for the subcutaneous tissue, Monocryl for the skin, Steri-Strips and sterile gauze. The patient was then awakened and returned to PACU in stable and satisfactory condition. There were no complications.  POST OPERATIVE PLAN: WBAT, DVT px: SCD's/TED, ambulation and chemical dvt px  [name], MD Orthopedic Surgeon [phone]

## 2020-12-14 NOTE — Evaluation (Signed)
Physical Therapy Evaluation Patient Details Name: Kyle Owen MRN: [id number] DOB: Jun 27, 1941 Today's Date: 12/14/2020   History of Present Illness  Patient is 79 y.o male s/p Lt THA anterior approach on 12/14/20 with PMH significant for stroke, OA, COPD, depression, HTN, HLD, PAF.    Clinical Impression  Kyle Owen is a 79 y.o. male POD 0 s/p Lt THA. Patient reports independence with [facility] for mobility at baseline. Patient is now limited by functional impairments (see PT problem list below) and requires min assist for transfers and gait with RW. Patient was able to ambulate ~25 feet with RW and min assist. Patient c/o Lt LE feelign shorter and appears to have Rt knee flexed in stance as well as a difference in leg length with Lt medial malleolus and femoral condyles being ~ 1 inch superior to Rt LE. Patient instructed in exercise to facilitate circulation to manage edema and prevent blood clots. Patient will benefit from continued skilled PT interventions to address impairments and progress towards PLOF. Acute PT will follow to progress mobility and stair training in preparation for safe discharge home.      Follow Up Recommendations Follow surgeon's recommendation for DC plan and follow-up therapies;Outpatient PT    Equipment Recommendations  None recommended by PT    Recommendations for Other Services       Precautions / Restrictions Precautions Precautions: Fall Restrictions Weight Bearing Restrictions: No Other Position/Activity Restrictions: WBAT      Mobility  Bed Mobility Overal bed mobility: Needs Assistance Bed Mobility: Supine to Sit     Supine to sit: Min assist;HOB elevated     General bed mobility comments: cues for use of bed rail, assist for Lt LE to move to EOB and assist to sit trunk up.    Transfers Overall transfer level: Needs assistance Equipment used: Rolling walker (2 wheeled) Transfers: Sit to/from Stand Sit to Stand: Min assist          General transfer comment: cues for safe technique with RW, assist for power up from EOB and to steady in standing.  Ambulation/Gait Ambulation/Gait assistance: Min assist Gait Distance (Feet): 25 Feet Assistive device: Rolling walker (2 wheeled) Gait Pattern/deviations: Step-to pattern;Decreased stride length;Decreased weight shift to left Gait velocity: decr   General Gait Details: pt with Rt knee flexed during gait and c/o Lt LE feeling shorter. min assist to manage walker and steady intermittently.cues for safe step pattern/proximity to RW throughout.  Stairs            Wheelchair Mobility    Modified Rankin (Stroke Patients Only)       Balance Overall balance assessment: Needs assistance Sitting-balance support: Feet supported Sitting balance-Leahy Scale: Good     Standing balance support: During functional activity;Bilateral upper extremity supported Standing balance-Leahy Scale: Poor                               Pertinent Vitals/Pain Pain Assessment: 0-10 Pain Score: 2  Pain Location: Lt hip Pain Descriptors / Indicators: Aching;Discomfort Pain Intervention(s): Limited activity within patient's tolerance;Monitored during session;Repositioned;Ice applied    Home Living Family/patient expects to be discharged to:: Private residence Living Arrangements: Spouse/significant other Available Help at Discharge: Family Type of Home: House Home Access: Stairs to enter Entrance Stairs-Rails: Can reach both Entrance Stairs-Number of Steps: 2+1 Home Layout: Two level;Full bath on main level;Able to live on main level with bedroom/bathroom Home Equipment: Gilford Rile - 2 wheels;Cane -  single point;Bedside commode      Prior Function Level of Independence: Independent with assistive device(s)         Comments: pt has used a cane for ~3 years     Hand Dominance   Dominant Hand: Right    Extremity/Trunk Assessment   Upper Extremity  Assessment Upper Extremity Assessment: Overall WFL for tasks assessed    Lower Extremity Assessment Lower Extremity Assessment: LLE deficits/detail LLE Deficits / Details: good quad activation LLE Sensation: WNL LLE Coordination: WNL    Cervical / Trunk Assessment Cervical / Trunk Assessment: Normal  Communication   Communication: HOH  Cognition Arousal/Alertness: Awake/alert Behavior During Therapy: WFL for tasks assessed/performed Overall Cognitive Status: Within Functional Limits for tasks assessed                                        General Comments General comments (skin integrity, edema, etc.): pt Lt medial femoral condyle and Lt ankle medial malleolus appear to rest ~ 64m inch superior to Rt LE.    Exercises Total Joint Exercises Ankle Circles/Pumps: AROM;Both;20 reps;Seated   Assessment/Plan    PT Assessment Patient needs continued PT services  PT Problem List Decreased strength;Decreased range of motion;Decreased activity tolerance;Decreased balance;Decreased mobility;Decreased knowledge of use of DME;Decreased knowledge of precautions       PT Treatment Interventions DME instruction;Stair training;Gait training;Functional mobility training;Therapeutic activities;Therapeutic exercise;Balance training;Patient/family education    PT Goals (Current goals can be found in the Care Plan section)  Acute Rehab PT Goals Patient Stated Goal: recover independence PT Goal Formulation: With patient Time For Goal Achievement: 12/21/20 Potential to Achieve Goals: Good    Frequency 7X/week   Barriers to discharge        Co-evaluation               AM-PAC PT "6 Clicks" Mobility  Outcome Measure Help needed turning from your back to your side while in a flat bed without using bedrails?: A Little Help needed moving from lying on your back to sitting on the side of a flat bed without using bedrails?: A Little Help needed moving to and from a bed to  a chair (including a wheelchair)?: A Little Help needed standing up from a chair using your arms (e.g., wheelchair or bedside chair)?: A Little Help needed to walk in hospital room?: A Little Help needed climbing 3-5 steps with a railing? : A Lot 6 Click Score: 17    End of Session Equipment Utilized During Treatment: Gait belt Activity Tolerance: Patient tolerated treatment well Patient left: in chair;with call bell/phone within reach;with chair alarm set;with family/visitor present Nurse Communication: Mobility status PT Visit Diagnosis: Muscle weakness (generalized) (M62.81);Difficulty in walking, not elsewhere classified (R26.2)    Time: 3267-1245 PT Time Calculation (min) (ACUTE ONLY): 33 min   Charges:   PT Evaluation $PT Eval Low Complexity: 1 Low PT Treatments $Gait Training: 8-22 mins        Verner Mould, DPT Acute Rehabilitation Services Office [phone] Pager [phone]    Jacques Navy 12/14/2020, 5:17 PM

## 2020-12-14 NOTE — Transfer of Care (Signed)
Immediate Anesthesia Transfer of Care Note  Patient: Kyle Owen  Procedure(s) Performed: TOTAL HIP ARTHROPLASTY ANTERIOR APPROACH (Left Hip)  Patient Location: PACU  Anesthesia Type:Spinal  Level of Consciousness: awake, drowsy and patient cooperative  Airway & Oxygen Therapy: Patient Spontanous Breathing and Patient connected to face mask oxygen  Post-op Assessment: Report given to RN and Post -op Vital signs reviewed and stable  Post vital signs: Reviewed and stable  Last Vitals:  Vitals Value Taken Time  BP    Temp 36.4 C 12/14/20 1228  Pulse 56 12/14/20 1230  Resp 16 12/14/20 1230  SpO2 97 % 12/14/20 1230    Last Pain:  Vitals:   12/14/20 0806  TempSrc: Oral  PainSc:       Patients Stated Pain Goal: 5 (54/62/70 3500)  Complications: No complications documented.

## 2020-12-14 NOTE — Anesthesia Procedure Notes (Signed)
Spinal  Patient location during procedure: OR Start time: 12/14/2020 10:27 AM End time: 12/14/2020 10:30 AM Staffing Performed: anesthesiologist  Anesthesiologist: Lyn Hollingshead, MD Preanesthetic Checklist Completed: patient identified, IV checked, site marked, risks and benefits discussed, surgical consent, monitors and equipment checked, pre-op evaluation and timeout performed Spinal Block Patient position: sitting Prep: DuraPrep and site prepped and draped Patient monitoring: continuous pulse ox and blood pressure Approach: midline Location: L3-4 Injection technique: single-shot Needle Needle type: Pencan  Needle gauge: 24 G Needle length: 10 cm Needle insertion depth: 6 cm Assessment Sensory level: T8

## 2020-12-14 NOTE — Anesthesia Procedure Notes (Signed)
Procedure Name: MAC Date/Time: 12/14/2020 10:26 AM Performed by: Eben Burow, CRNA Pre-anesthesia Checklist: Patient identified, Emergency Drugs available, Suction available, Patient being monitored and Timeout performed Oxygen Delivery Method: Simple face mask Placement Confirmation: positive ETCO2

## 2020-12-14 NOTE — Plan of Care (Signed)

## 2020-12-14 NOTE — Discharge Instructions (Signed)
You may bear weight as tolerated. Keep your dressing on and dry until follow up. Take medicine to prevent blood clots as directed. Take pain medicine as needed with the goal of transitioning to over the counter medicines.  If needed, you may increase breakthrough pain medication (oxycodone) for the first few days post op - up to 2 tablets every 4 hours.  Stop this medication as soon as you are able.  INSTRUCTIONS AFTER JOINT REPLACEMENT   o Remove items at home which could result in a fall. This includes throw rugs or furniture in walking pathways o ICE to the affected joint every three hours while awake for 30 minutes at a time, for at least the first 3-5 days, and then as needed for pain and swelling.  Continue to use ice for pain and swelling. You may notice swelling that will progress down to the foot and ankle.  This is normal after surgery.  Elevate your leg when you are not up walking on it.   o Continue to use the breathing machine you got in the hospital (incentive spirometer) which will help keep your temperature down.  It is common for your temperature to cycle up and down following surgery, especially at night when you are not up moving around and exerting yourself.  The breathing machine keeps your lungs expanded and your temperature down.   DIET:  As you were doing prior to hospitalization, we recommend a well-balanced diet.  DRESSING / WOUND CARE / SHOWERING  You may shower 3 days after surgery, but keep the wounds dry during showering.  You may use an occlusive plastic wrap (Press'n Seal for example) with blue painter's tape at edges, NO SOAKING/SUBMERGING IN THE BATHTUB.  If the bandage gets wet, change with a clean dry gauze.  If the incision gets wet, pat the wound dry with a clean towel.  ACTIVITY  o Increase activity slowly as tolerated, but follow the weight bearing instructions below.   o No driving for 6 weeks or until further direction given by your physician.  You  cannot drive while taking narcotics.  o No lifting or carrying greater than 10 lbs. until further directed by your surgeon. o Avoid periods of inactivity such as sitting longer than an hour when not asleep. This helps prevent blood clots.  o You may return to work once you are authorized by your doctor.     WEIGHT BEARING   Weight bearing as tolerated with assist device (walker, cane, etc) as directed, use it as long as suggested by your surgeon or therapist, typically at least 4-6 weeks.   EXERCISES  Results after joint replacement surgery are often greatly improved when you follow the exercise, range of motion and muscle strengthening exercises prescribed by your doctor. Safety measures are also important to protect the joint from further injury. Any time any of these exercises cause you to have increased pain or swelling, decrease what you are doing until you are comfortable again and then slowly increase them. If you have problems or questions, call your caregiver or physical therapist for advice.   Rehabilitation is important following a joint replacement. After just a few days of immobilization, the muscles of the leg can become weakened and shrink (atrophy).  These exercises are designed to build up the tone and strength of the thigh and leg muscles and to improve motion. Often times heat used for twenty to thirty minutes before working out will loosen up your tissues and help with   improving the range of motion but do not use heat for the first two weeks following surgery (sometimes heat can increase post-operative swelling).   These exercises can be done on a training (exercise) mat, on the floor, on a table or on a bed. Use whatever works the best and is most comfortable for you.    Use music or television while you are exercising so that the exercises are a pleasant break in your day. This will make your life better with the exercises acting as a break in your routine that you can look  forward to.   Perform all exercises about fifteen times, three times per day or as directed.  You should exercise both the operative leg and the other leg as well.  Exercises include:   . Quad Sets - Tighten up the muscle on the front of the thigh (Quad) and hold for 5-10 seconds.   . Straight Leg Raises - With your knee straight (if you were given a brace, keep it on), lift the leg to 60 degrees, hold for 3 seconds, and slowly lower the leg.  Perform this exercise against resistance later as your leg gets stronger.  . Leg Slides: Lying on your back, slowly slide your foot toward your buttocks, bending your knee up off the floor (only go as far as is comfortable). Then slowly slide your foot back down until your leg is flat on the floor again.  . Angel Wings: Lying on your back spread your legs to the side as far apart as you can without causing discomfort.  . Hamstring Strength:  Lying on your back, push your heel against the floor with your leg straight by tightening up the muscles of your buttocks.  Repeat, but this time bend your knee to a comfortable angle, and push your heel against the floor.  You may put a pillow under the heel to make it more comfortable if necessary.   A rehabilitation program following joint replacement surgery can speed recovery and prevent re-injury in the future due to weakened muscles. Contact your doctor or a physical therapist for more information on knee rehabilitation.    CONSTIPATION  Constipation is defined medically as fewer than three stools per week and severe constipation as less than one stool per week.  Even if you have a regular bowel pattern at home, your normal regimen is likely to be disrupted due to multiple reasons following surgery.  Combination of anesthesia, postoperative narcotics, change in appetite and fluid intake all can affect your bowels.   YOU MUST use at least one of the following options; they are listed in order of increasing strength  to get the job done.  They are all available over the counter, and you may need to use some, POSSIBLY even all of these options:    Drink plenty of fluids (prune juice may be helpful) and high fiber foods Colace 100 mg by mouth twice a day  Senokot for constipation as directed and as needed Dulcolax (bisacodyl), take with full glass of water  Miralax (polyethylene glycol) once or twice a day as needed.  If you have tried all these things and are unable to have a bowel movement in the first 3-4 days after surgery call either your surgeon or your primary doctor.    If you experience loose stools or diarrhea, hold the medications until you stool forms back up.  If your symptoms do not get better within 1 week or if they get worse,   check with your doctor.  If you experience "the worst abdominal pain ever" or develop nausea or vomiting, please contact the office immediately for further recommendations for treatment.   ITCHING:  If you experience itching with your medications, try taking only a single pain pill, or even half a pain pill at a time.  You can also use Benadryl over the counter for itching or also to help with sleep.   TED HOSE STOCKINGS:  Use stockings on both legs until for at least 2 weeks or as directed by physician office. They may be removed at night for sleeping.  MEDICATIONS:  See your medication summary on the "After Visit Summary" that nursing will review with you.  You may have some home medications which will be placed on hold until you complete the course of blood thinner medication.  It is important for you to complete the blood thinner medication as prescribed.  PRECAUTIONS:  If you experience chest pain or shortness of breath - call 911 immediately for transfer to the hospital emergency department.   If you develop a fever greater that 101 F, purulent drainage from wound, increased redness or drainage from wound, foul odor from the wound/dressing, or calf pain - CONTACT  YOUR SURGEON.                                                   FOLLOW-UP APPOINTMENTS:  If you do not already have a post-op appointment, please call the office for an appointment to be seen by your surgeon.  Guidelines for how soon to be seen are listed in your "After Visit Summary", but are typically between 1-4 weeks after surgery.  OTHER INSTRUCTIONS:  Dental Antibiotics:  In most cases prophylactic antibiotics for Dental procdeures after total joint surgery are not necessary.  Exceptions are as follows:  1. History of prior total joint infection  2. Severely immunocompromised (Organ Transplant, cancer chemotherapy, Rheumatoid biologic meds such as Humera)  3. Poorly controlled diabetes (A1C &gt; 8.0, blood glucose over 200)  If you have one of these conditions, contact your surgeon for an antibiotic prescription, prior to your dental procedure.   MAKE SURE YOU:  . Understand these instructions.  . Get help right away if you are not doing well or get worse.    Thank you for letting us be a part of your medical care team.  It is a privilege we respect greatly.  We hope these instructions will help you stay on track for a fast and full recovery!     

## 2020-12-15 ENCOUNTER — Observation Stay (HOSPITAL_COMMUNITY): Payer: Medicare Other

## 2020-12-15 DIAGNOSIS — Z7901 Long term (current) use of anticoagulants: Secondary | ICD-10-CM | POA: Diagnosis not present

## 2020-12-15 DIAGNOSIS — M1612 Unilateral primary osteoarthritis, left hip: Secondary | ICD-10-CM | POA: Diagnosis not present

## 2020-12-15 DIAGNOSIS — Z87891 Personal history of nicotine dependence: Secondary | ICD-10-CM | POA: Diagnosis not present

## 2020-12-15 DIAGNOSIS — Z79899 Other long term (current) drug therapy: Secondary | ICD-10-CM | POA: Diagnosis not present

## 2020-12-15 DIAGNOSIS — Z96642 Presence of left artificial hip joint: Secondary | ICD-10-CM | POA: Diagnosis not present

## 2020-12-15 DIAGNOSIS — Z9889 Other specified postprocedural states: Secondary | ICD-10-CM | POA: Diagnosis not present

## 2020-12-15 DIAGNOSIS — J449 Chronic obstructive pulmonary disease, unspecified: Secondary | ICD-10-CM | POA: Diagnosis not present

## 2020-12-15 DIAGNOSIS — I10 Essential (primary) hypertension: Secondary | ICD-10-CM | POA: Diagnosis not present

## 2020-12-15 DIAGNOSIS — Z471 Aftercare following joint replacement surgery: Secondary | ICD-10-CM | POA: Diagnosis not present

## 2020-12-15 MED ORDER — ACETAMINOPHEN 500 MG PO TABS: 1000.0000 mg | ORAL_TABLET | Freq: Four times a day (QID) | ORAL | 0 refills | Status: DC

## 2020-12-15 MED ORDER — OMEPRAZOLE 20 MG PO CPDR: 20.0000 mg | DELAYED_RELEASE_CAPSULE | Freq: Every day | ORAL | 0 refills | Status: DC

## 2020-12-15 MED ORDER — BACLOFEN 10 MG PO TABS: 10.0000 mg | ORAL_TABLET | Freq: Two times a day (BID) | ORAL | 0 refills | Status: AC

## 2020-12-15 MED ORDER — OXYCODONE HCL 5 MG PO TABS: 5.0000 mg | ORAL_TABLET | Freq: Four times a day (QID) | ORAL | 0 refills | Status: AC | PRN

## 2020-12-15 MED ORDER — ONDANSETRON HCL 4 MG PO TABS: 4.0000 mg | ORAL_TABLET | Freq: Four times a day (QID) | ORAL | 0 refills | Status: DC | PRN

## 2020-12-15 NOTE — Progress Notes (Signed)
Physical Therapy Treatment Patient Details Name: Kyle Owen MRN: [id number] DOB: Dec 05, 1941 Today's Date: 12/15/2020    History of Present Illness Patient is 79 y.o male s/p Lt THA anterior approach on 12/14/20 with PMH significant for stroke, OA, COPD, depression, HTN, HLD, PAF.    PT Comments    Pt continuing to progress well. Wife present for session. Pt anxious to go home; ready to d/c home with wife assist prn from PT standpoint. Likely will need initial min/guard/supervision for mobility to prevent falls. Wife demo's ability to cue pt for safe functional mobility techniques.   Follow Up Recommendations  Follow surgeon's recommendation for DC plan and follow-up therapies;Outpatient PT     Equipment Recommendations  None recommended by PT    Recommendations for Other Services       Precautions / Restrictions Precautions Precautions: Fall Restrictions Other Position/Activity Restrictions: WBAT    Mobility  Bed Mobility               General bed mobility comments: in recliner  on PT arrival  Transfers Overall transfer level: Needs assistance Equipment used: Rolling walker (2 wheeled) Transfers: Sit to/from Stand Sit to Stand: Min assist;Min guard         General transfer comment: cues for safe technique with RW, assist for power up from EOB and to steady in standing. wife demo's good carryover with proper and safe cues  Ambulation/Gait Ambulation/Gait assistance: Min guard;Min assist Gait Distance (Feet): 80 Feet Assistive device: Rolling walker (2 wheeled) Gait Pattern/deviations: Step-to pattern;Decreased stride length;Decreased weight shift to left;Narrow base of support     General Gait Details: cues for sequence, RW position, to stay inside RW, to keep both hands on RW   Stairs Stairs: Yes Stairs assistance: Min assist Stair Management: Step to pattern;Two rails;Forwards Number of Stairs: 5 General stair comments: multi-modal cues for  sequence and safety   Wheelchair Mobility    Modified Rankin (Stroke Patients Only)       Balance Overall balance assessment: Needs assistance           Standing balance-Leahy Scale: Poor Standing balance comment: reliant on UEs (pt was able to stand and don mask withmin/guard for safety)                            Cognition Arousal/Alertness: Awake/alert Behavior During Therapy: WFL for tasks assessed/performed Overall Cognitive Status: Within Functional Limits for tasks assessed                                        Exercises Total Joint Exercises Ankle Circles/Pumps: AROM;Both;10 reps    General Comments        Pertinent Vitals/Pain Pain Assessment: 0-10 Pain Score: 5  Pain Location: Lt hip Pain Descriptors / Indicators: Aching;Discomfort Pain Intervention(s): Limited activity within patient's tolerance;Monitored during session;Repositioned;Patient requesting pain meds-RN notified    Home Living                      Prior Function            PT Goals (current goals can now be found in the care plan section) Acute Rehab PT Goals Patient Stated Goal: recover independence PT Goal Formulation: With patient Time For Goal Achievement: 12/21/20 Potential to Achieve Goals: Good    Frequency    7X/week  PT Plan Current plan remains appropriate    Co-evaluation              AM-PAC PT "6 Clicks" Mobility   Outcome Measure  Help needed turning from your back to your side while in a flat bed without using bedrails?: A Little Help needed moving from lying on your back to sitting on the side of a flat bed without using bedrails?: A Little Help needed moving to and from a bed to a chair (including a wheelchair)?: A Little Help needed standing up from a chair using your arms (e.g., wheelchair or bedside chair)?: A Little Help needed to walk in hospital room?: A Little Help needed climbing 3-5 steps with a  railing? : A Little 6 Click Score: 18    End of Session Equipment Utilized During Treatment: Gait belt Activity Tolerance: Patient tolerated treatment well Patient left: in chair;with call bell/phone within reach;with chair alarm set;with family/visitor present Nurse Communication: Mobility status PT Visit Diagnosis: Muscle weakness (generalized) (M62.81);Difficulty in walking, not elsewhere classified (R26.2)     Time: 6834-1962 PT Time Calculation (min) (ACUTE ONLY): 22 min  Charges:  $Gait Training: 8-22 mins                     Baxter Flattery, PT  Acute Rehab Dept ([place]) [phone] Pager [phone]  12/15/2020    [facility] 12/15/2020, 3:00 PM

## 2020-12-15 NOTE — Progress Notes (Signed)
    Subjective: Patient reports pain as mild.  Tolerating diet.  Urinating.  +Flatus.  No CP, SOB.   OOB. States he feels like his operative leg is slightly shorter then the other.   Objective:   VITALS:   Vitals:   12/14/20 1755 12/14/20 2048 12/15/20 0206 12/15/20 0539  BP: 134/69 127/69 99/72 111/68  Pulse: 86 76 79 69  Resp:  18 16 16   Temp: 97.8 F (36.6 C) 97.7 F (36.5 C) 98 F (36.7 C) 97.7 F (36.5 C)  TempSrc:  Oral Oral Oral  SpO2: 93% 96% 96% 96%  Weight:      Height:       CBC Latest Ref Rng & Units 12/06/2020 03/23/2020 03/28/2017  WBC 4.0 - 10.5 K/uL 5.0 4.7 6.6  Hemoglobin 13.0 - 17.0 g/dL 13.8 13.3 14.1  Hematocrit 39.0 - 52.0 % 44.8 43.2 44.6  Platelets 150 - 400 K/uL 180 149(L) 152   BMP Latest Ref Rng & Units 12/06/2020 10/04/2020 03/23/2020  Glucose 70 - 99 mg/dL 74 94 97  BUN 8 - 23 mg/dL 26(H) 30(H) 27(H)  Creatinine 0.61 - 1.24 mg/dL 1.32(H) 1.46(H) 1.42(H)  BUN/Creat Ratio 10 - 24 - 21 -  Sodium 135 - 145 mmol/L 144 142 144  Potassium 3.5 - 5.1 mmol/L 4.3 4.4 4.2  Chloride 98 - 111 mmol/L 108 102 108  CO2 22 - 32 mmol/L 27 26 29   Calcium 8.9 - 10.3 mg/dL 9.1 9.2 8.7(L)   Intake/Output      12/21 0701 12/22 0700 12/22 0701 12/23 0700   P.O. 1242    I.V. (mL/kg) 1532 (16.1)    IV Piggyback 410    Total Intake(mL/kg) 3184 (33.4)    Urine (mL/kg/hr) 3080    Blood 300    Total Output 3380    Net -196            Physical Exam: General: NAD.  Resting comfortably in bed Resp: No increased wob Cardio: regular rate and rhythm ABD soft Neurologically intact MSK Neurovascularly intact Sensation intact distally Intact pulses distally Dorsiflexion/Plantar flexion intact Incision: dressing C/D/I Neurovascular intact Dorsiflexion/Plantar flexion intact No cellulitis present Compartment soft  Assessment: 1 Day Post-Op  S/P Procedure(s) (LRB): TOTAL HIP ARTHROPLASTY ANTERIOR APPROACH (Left) by Dr. Ernesta Amble. Murphy on 12/15/20  Active  Problems:   S/P total hip arthroplasty    Primary osteoarthritis, status post total hip arthroplasty   Plan: Up with therapy D/C IV fluids Incentive Spirometry Apply ice Leg length: will get portable XR to assess.   Weight Bearing: Weight Bearing as Tolerated (WBAT) LLE Dressings: Maintain Mepilex.   VTE prophylaxis: Eliquis 5mg  BID, SCDs, ambulation Dispo: Home once cleared by PT/OT  Kyle Fee, PA-C 12/15/2020, 7:31 AM

## 2020-12-15 NOTE — Plan of Care (Signed)

## 2020-12-15 NOTE — Discharge Summary (Signed)
Discharge Summary  Patient ID: DESTYN BLAISDELL MRN: [id number] DOB/AGE: 1941/10/14 79 y.o.  Admit date: 12/14/2020 Discharge date: 12/15/2020  Admission Diagnoses:  OA of the left hip  Discharge Diagnoses:  Active Problems:   S/P total hip arthroplasty   Past Medical History:  Diagnosis Date  . Arthritis    DJD right knee  . Benign prostatic hypertrophy with urinary frequency   . Bladder cancer (HCC)   . Complication of anesthesia    gets combative in recovery  . COPD with emphysema (HCC)   . Depression   . Dyspnea    when walking  . Dysrhythmia    a fib  . History of colon polyps   . History of kidney stones   . History of loop recorder    loop recorder in place battery is dead has not had changed due to covid  . History of pneumonia    hx Recurrent -- last bout Dec 2016 CAP-- per pt resolved  . History of TIA (transient ischemic attack) no residual per pt   per neurologist note (dr Marjory Lies 11-08-2015) dx cryptogenic TIA versus arrhythia versus dysautonomia (right ophthalmic artery TIA and x3 posterior circulation TIAs  . Hyperlipidemia   . Hypertension   . 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 Nephrolithiasis    right non-obstructive  per CT 05-02-2016  . Ocular migraine    controlled w/ verapamil  . PAF (paroxysmal atrial fibrillation) (HCC)    followed by AFIB clinic-- cardiologist-- dr Anne Fu  . Pneumonia    history of pnu.  . Pulmonary nodule, left    left lower lobe x2 per CT 01-20-2016  . Renal cyst, left   . Stroke (HCC)    TIA's  . 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 Wears hearing aid    bilateral-- wear at times    Surgeries: Procedure(s): TOTAL HIP ARTHROPLASTY ANTERIOR APPROACH on 12/14/2020   Consultants (if any):   Discharged Condition: Improved  Hospital Course: Kyle Owen is an  79 y.o. male who was admitted 12/14/2020 with a diagnosis of <principal problem not specified> and went to the operating room on 12/14/2020 and underwent the above named procedures.     He was given perioperative antibiotics:  Anti-infectives (From admission, onward)   Start     Dose/Rate Route Frequency Ordered Stop   12/14/20 1630  ceFAZolin (ANCEF) IVPB 2g/100 mL premix        2 g 200 mL/hr over 30 Minutes Intravenous Every 6 hours 12/14/20 1432 12/14/20 2200   12/14/20 0745  ceFAZolin (ANCEF) IVPB 2g/100 mL premix        2 g 200 mL/hr over 30 Minutes Intravenous On call to O.R. 12/14/20 7903 12/14/20 1033    .  He was given sequential compression devices, early ambulation, and Eliquis for DVT prophylaxis.  He benefited maximally from the hospital stay and there were no complications.    Recent vital signs:  Vitals:   12/15/20 1006 12/15/20 1300  BP: 101/67   Pulse: 85   Resp: 18   Temp: 98.6 F (37 C)   SpO2: 90% 92%    Recent laboratory studies:  Lab Results  Component Value Date   HGB 13.8 12/06/2020   HGB 13.3 03/23/2020   HGB 14.1 03/28/2017   Lab Results  Component Value Date  WBC 5.0 12/06/2020   PLT 180 12/06/2020   Lab Results  Component Value Date   INR 1.3 (H) 12/06/2020   Lab Results  Component Value Date   NA 144 12/06/2020   K 4.3 12/06/2020   CL 108 12/06/2020   CO2 27 12/06/2020   BUN 26 (H) 12/06/2020   CREATININE 1.32 (H) 12/06/2020   GLUCOSE 74 12/06/2020    Discharge Medications:   Allergies as of 12/15/2020      Reactions   Flomax [tamsulosin Hcl] Nausea And Vomiting, Other (See Comments)   "Pt thought he was dying " DIZZY   Fentanyl    Makes me combative       Medication List    STOP taking these medications   enoxaparin 150 MG/ML injection Commonly known as: LOVENOX     TAKE these medications   acetaminophen 500 MG tablet Commonly known as: TYLENOL Take 2 tablets (1,000 mg total) by mouth every 6 (six)  hours. What changed:   how much to take  when to take this  additional instructions   atorvastatin 20 MG tablet Commonly known as: LIPITOR TAKE 1 TABLET BY MOUTH DAILY. What changed: how much to take   baclofen 10 MG tablet Commonly known as: LIORESAL Take 1 tablet (10 mg total) by mouth 2 (two) times daily for 20 doses.   budesonide-formoterol 160-4.5 MCG/ACT inhaler Commonly known as: SYMBICORT Inhale 2 puffs into the lungs 2 (two) times daily.   Eliquis 5 MG Tabs tablet Generic drug: apixaban TAKE (1) TABLET BY MOUTH TWICE DAILY. What changed: See the new instructions.   finasteride 5 MG tablet Commonly known as: PROSCAR Take 5 mg by mouth daily.   loratadine 10 MG tablet Commonly known as: CLARITIN Take 10 mg by mouth daily as needed for allergies.   Magnesium 250 MG Tabs Take 500 mg by mouth daily.   omeprazole 20 MG capsule Commonly known as: PriLOSEC Take 1 capsule (20 mg total) by mouth daily.   ondansetron 4 MG tablet Commonly known as: ZOFRAN Take 1 tablet (4 mg total) by mouth every 6 (six) hours as needed for nausea.   OVER THE COUNTER MEDICATION Apply 1 application topically daily as needed (Joint pain). CBD cream   oxyCODONE 5 MG immediate release tablet Commonly known as: Oxy IR/ROXICODONE Take 1 tablet (5 mg total) by mouth every 6 (six) hours as needed for up to 7 days for moderate pain or severe pain (pain score 4-6).   sertraline 50 MG tablet Commonly known as: ZOLOFT Take 50 mg by mouth every morning.   Ventolin HFA 108 (90 Base) MCG/ACT inhaler Generic drug: albuterol Inhale 2 puffs into the lungs Every 4 hours as needed for wheezing or shortness of breath.   verapamil 240 MG CR tablet Commonly known as: CALAN-SR Take 240 mg by mouth daily.       Diagnostic Studies: DG C-Arm 1-60 Min-No Report  Result Date: 12/14/2020 Fluoroscopy was utilized by the requesting physician.  No radiographic interpretation.   DG HIP PORT UNILAT  WITH PELVIS 1V LEFT  Result Date: 12/15/2020 CLINICAL DATA:  Left total hip replacement EXAM: DG HIP (WITH OR WITHOUT PELVIS) 1V PORT LEFT COMPARISON:  None. FINDINGS: Interval total left hip arthroplasty. No fracture or dislocation. No hardware failure or complication. Postsurgical changes in the surrounding soft tissues. IMPRESSION: Interval total left hip arthroplasty. Electronically Signed   By: Kathreen Devoid   On: 12/15/2020 10:03   DG HIP OPERATIVE UNILAT W OR  W/O PELVIS LEFT  Result Date: 12/14/2020 CLINICAL DATA:  LEFT total hip arthroplasty EXAM: OPERATIVE LEFT HIP (WITH PELVIS IF PERFORMED) 2 VIEWS TECHNIQUE: Fluoroscopic spot image(s) were submitted for interpretation post-operatively. COMPARISON:  03/30/2016 FLUOROSCOPY TIME:  0 minutes 7 seconds Dose: 1.3731 mGy Images: 2 FINDINGS: Osseous demineralization. LEFT hip prosthesis identified without fracture or dislocation on single AP view. IMPRESSION: LEFT hip prosthesis without acute abnormality. Electronically Signed   By: Lavonia Dana M.D.   On: 12/14/2020 12:06    Disposition: Discharge disposition: 01-Home or Self Care          Follow-up Information    [name], MD. Go on 12/29/2020.   Specialty: Orthopedic Surgery Why: Your appointment is scheduled for 4:15.  Contact information: [address] [place] [address] [phone]        [facility]. Go on 12/16/2020.   Why: You are scheduled to start outpatient physical therapy on 12/16/20. THey will call you to arrange a time.  Contact information: [place], [place]       [name], MD In 2 weeks.   Specialty: Orthopedic Surgery Contact information: [address] [place] [address] [phone]                Signed: Rachael Fee PA-C 12/15/2020, 3:08 PM

## 2020-12-15 NOTE — Progress Notes (Signed)
Patient is being discharged in stable condition. Pain is tolerable and patient is progressing well. Excited to go home.

## 2020-12-15 NOTE — Progress Notes (Signed)
Met briefly with pt and spouse to review dc arrangements.  Pt is part of [facility].  No DME needs.  Plan for OPPT at [facility].  No TOC needs.  Kyle Happ, LCSW

## 2020-12-15 NOTE — Progress Notes (Signed)
Physical Therapy Treatment Patient Details Name: Kyle Owen MRN: [id number] DOB: 1941/02/12 Today's Date: 12/15/2020    History of Present Illness Patient is 79 y.o male s/p Lt THA anterior approach on 12/14/20 with PMH significant for stroke, OA, COPD, depression, HTN, HLD, PAF.    PT Comments    Pt is progressing toward PT goals, incr gait distance this am. Will see for another session and hopefully will be able to d/c later today   Follow Up Recommendations  Follow surgeon's recommendation for DC plan and follow-up therapies;Outpatient PT     Equipment Recommendations  None recommended by PT    Recommendations for Other Services       Precautions / Restrictions Precautions Precautions: Fall Restrictions Weight Bearing Restrictions: No Other Position/Activity Restrictions: WBAT    Mobility  Bed Mobility               General bed mobility comments: EOB on PT arrival  Transfers Overall transfer level: Needs assistance Equipment used: Rolling walker (2 wheeled) Transfers: Sit to/from Stand Sit to Stand: Min assist         General transfer comment: cues for safe technique with RW, assist for power up from EOB and to steady in standing.  Ambulation/Gait Ambulation/Gait assistance: Min guard;Min assist Gait Distance (Feet): 90 Feet Assistive device: Rolling walker (2 wheeled) Gait Pattern/deviations: Step-to pattern;Decreased stride length;Decreased weight shift to left;Narrow base of support     General Gait Details: cues for sequence, RW position, to stay inside RW   Stairs             Wheelchair Mobility    Modified Rankin (Stroke Patients Only)       Balance Overall balance assessment: Needs assistance           Standing balance-Leahy Scale: Poor Standing balance comment: reliant on UEs                            Cognition Arousal/Alertness: Awake/alert Behavior During Therapy: WFL for tasks  assessed/performed Overall Cognitive Status: Within Functional Limits for tasks assessed                                        Exercises Total Joint Exercises Ankle Circles/Pumps: AROM;Both;10 reps Quad Sets: AROM;Both;10 reps Heel Slides: AAROM;Left;10 reps    General Comments        Pertinent Vitals/Pain Pain Assessment: 0-10 Pain Score: 4  Pain Location: Lt hip Pain Descriptors / Indicators: Aching;Discomfort Pain Intervention(s): Limited activity within patient's tolerance;Premedicated before session;Repositioned    Home Living                      Prior Function            PT Goals (current goals can now be found in the care plan section) Acute Rehab PT Goals Patient Stated Goal: recover independence PT Goal Formulation: With patient Time For Goal Achievement: 12/21/20 Potential to Achieve Goals: Good Progress towards PT goals: Progressing toward goals    Frequency    7X/week      PT Plan Current plan remains appropriate    Co-evaluation              AM-PAC PT "6 Clicks" Mobility   Outcome Measure  Help needed turning from your back to your side while in a flat  bed without using bedrails?: A Little Help needed moving from lying on your back to sitting on the side of a flat bed without using bedrails?: A Little Help needed moving to and from a bed to a chair (including a wheelchair)?: A Little Help needed standing up from a chair using your arms (e.g., wheelchair or bedside chair)?: A Little Help needed to walk in hospital room?: A Little Help needed climbing 3-5 steps with a railing? : A Lot 6 Click Score: 17    End of Session Equipment Utilized During Treatment: Gait belt Activity Tolerance: Patient tolerated treatment well Patient left: in chair;with call bell/phone within reach;with chair alarm set;with family/visitor present Nurse Communication: Mobility status PT Visit Diagnosis: Muscle weakness (generalized)  (M62.81);Difficulty in walking, not elsewhere classified (R26.2)     Time: 1740-8144 PT Time Calculation (min) (ACUTE ONLY): 18 min  Charges:  $Gait Training: 8-22 mins                     Baxter Flattery, PT  Acute Rehab Dept (Home) [phone] Pager [phone]  12/15/2020    [facility] 12/15/2020, 11:50 AM

## 2020-12-15 NOTE — Plan of Care (Signed)
Problem: Education: Goal: Knowledge of General Education information will improve Description: Including pain rating scale, medication(s)/side effects and non-pharmacologic comfort measures 12/15/2020 1430 by Deetta Perla, RN Outcome: Adequate for Discharge 12/15/2020 0942 by Deetta Perla, RN Outcome: Progressing   Problem: Health Behavior/Discharge Planning: Goal: Ability to manage health-related needs will improve 12/15/2020 1430 by Montavis Schubring, Helane Gunther, RN Outcome: Adequate for Discharge 12/15/2020 0942 by Deetta Perla, RN Outcome: Progressing   Problem: Clinical Measurements: Goal: Ability to maintain clinical measurements within normal limits will improve 12/15/2020 1430 by Deetta Perla, RN Outcome: Adequate for Discharge 12/15/2020 0942 by Deetta Perla, RN Outcome: Progressing Goal: Will remain free from infection 12/15/2020 1430 by Deetta Perla, RN Outcome: Adequate for Discharge 12/15/2020 0942 by Deetta Perla, RN Outcome: Progressing Goal: Diagnostic test results will improve 12/15/2020 1430 by Deetta Perla, RN Outcome: Adequate for Discharge 12/15/2020 5366 by Deetta Perla, RN Outcome: Progressing Goal: Respiratory complications will improve 12/15/2020 1430 by Deetta Perla, RN Outcome: Adequate for Discharge 12/15/2020 4403 by Deetta Perla, RN Outcome: Progressing Goal: Cardiovascular complication will be avoided 12/15/2020 1430 by Deetta Perla, RN Outcome: Adequate for Discharge 12/15/2020 4742 by Deetta Perla, RN Outcome: Progressing   Problem: Activity: Goal: Risk for activity intolerance will decrease 12/15/2020 1430 by Deetta Perla, RN Outcome: Adequate for Discharge 12/15/2020 0942 by Deetta Perla, RN Outcome: Progressing   Problem: Nutrition: Goal: Adequate nutrition will be maintained 12/15/2020 1430 by Deetta Perla, RN Outcome: Adequate for Discharge 12/15/2020 0942  by Deetta Perla, RN Outcome: Progressing   Problem: Coping: Goal: Level of anxiety will decrease 12/15/2020 1430 by Deetta Perla, RN Outcome: Adequate for Discharge 12/15/2020 0942 by Deetta Perla, RN Outcome: Progressing   Problem: Elimination: Goal: Will not experience complications related to bowel motility 12/15/2020 1430 by Deetta Perla, RN Outcome: Adequate for Discharge 12/15/2020 0942 by Deetta Perla, RN Outcome: Progressing Goal: Will not experience complications related to urinary retention 12/15/2020 1430 by Deetta Perla, RN Outcome: Adequate for Discharge 12/15/2020 0942 by Deetta Perla, RN Outcome: Progressing   Problem: Pain Managment: Goal: General experience of comfort will improve 12/15/2020 1430 by Deetta Perla, RN Outcome: Adequate for Discharge 12/15/2020 0942 by Deetta Perla, RN Outcome: Progressing   Problem: Safety: Goal: Ability to remain free from injury will improve 12/15/2020 1430 by Deetta Perla, RN Outcome: Adequate for Discharge 12/15/2020 0942 by Deetta Perla, RN Outcome: Progressing   Problem: Skin Integrity: Goal: Risk for impaired skin integrity will decrease 12/15/2020 1430 by Deetta Perla, RN Outcome: Adequate for Discharge 12/15/2020 0942 by Deetta Perla, RN Outcome: Progressing   Problem: Education: Goal: Knowledge of the prescribed therapeutic regimen will improve 12/15/2020 1430 by Deetta Perla, RN Outcome: Adequate for Discharge 12/15/2020 5956 by Deetta Perla, RN Outcome: Progressing Goal: Understanding of discharge needs will improve 12/15/2020 1430 by Deetta Perla, RN Outcome: Adequate for Discharge 12/15/2020 3875 by Deetta Perla, RN Outcome: Progressing Goal: Individualized Educational Video(s) 12/15/2020 1430 by Deetta Perla, RN Outcome: Adequate for Discharge 12/15/2020 0942 by Deetta Perla, RN Outcome: Progressing    Problem: Activity: Goal: Ability to avoid complications of mobility impairment will improve 12/15/2020 1430 by Ammie Warrick, Helane Gunther, RN Outcome: Adequate for Discharge 12/15/2020 6433 by Deetta Perla, RN Outcome: Progressing Goal: Ability to tolerate increased activity will improve 12/15/2020 1430 by Caralina Nop, Helane Gunther, RN  Outcome: Adequate for Discharge 12/15/2020 0942 by Deetta Perla, RN Outcome: Progressing   Problem: Clinical Measurements: Goal: Postoperative complications will be avoided or minimized 12/15/2020 1430 by Deetta Perla, RN Outcome: Adequate for Discharge 12/15/2020 0942 by Deetta Perla, RN Outcome: Progressing   Problem: Pain Management: Goal: Pain level will decrease with appropriate interventions 12/15/2020 1430 by Deetta Perla, RN Outcome: Adequate for Discharge 12/15/2020 0942 by Deetta Perla, RN Outcome: Progressing   Problem: Skin Integrity: Goal: Will show signs of wound healing 12/15/2020 1430 by Deetta Perla, RN Outcome: Adequate for Discharge 12/15/2020 0942 by Deetta Perla, RN Outcome: Progressing

## 2020-12-16 ENCOUNTER — Ambulatory Visit (HOSPITAL_COMMUNITY): Payer: Medicare Other | Attending: Orthopedic Surgery | Admitting: Physical Therapy

## 2020-12-16 ENCOUNTER — Encounter (HOSPITAL_COMMUNITY): Payer: Self-pay | Admitting: Physical Therapy

## 2020-12-16 ENCOUNTER — Other Ambulatory Visit: Payer: Self-pay

## 2020-12-16 DIAGNOSIS — R262 Difficulty in walking, not elsewhere classified: Secondary | ICD-10-CM | POA: Diagnosis not present

## 2020-12-16 DIAGNOSIS — M79605 Pain in left leg: Secondary | ICD-10-CM | POA: Diagnosis not present

## 2020-12-16 DIAGNOSIS — M6281 Muscle weakness (generalized): Secondary | ICD-10-CM | POA: Diagnosis not present

## 2020-12-16 NOTE — Patient Instructions (Signed)
Access Code: [email or web address] URL: [email or web address] Date: 12/16/2020 Prepared by: Sherlyn Lees  Exercises Supine Quad Set - 1 x daily - 7 x weekly - 3 sets - 10 reps - 3 sec hold Supine Heel Slide with Strap - 1 x daily - 7 x weekly - 3 sets - 10 reps - 3 sec hold

## 2020-12-16 NOTE — Therapy (Signed)
[place], [place], [address] Phone: [phone]   Fax:  [phone]  Physical Therapy Evaluation  Patient Details  Name: Kyle Owen MRN: [id number] Date of Birth: April 20, 1941 Referring Provider (PT): Dr. [name]   Encounter Date: 12/16/2020   PT End of Session - 12/16/20 1029    Visit Number 1    Number of Visits 18    Date for PT Re-Evaluation 01/27/21    Authorization Type Medicare, 14 combined visits PT/OT/ST    Authorization - Visit Number 1    Authorization - Number of Visits 60    Progress Note Due on Visit 10    PT Start Time 1000    PT Stop Time 1040    PT Time Calculation (min) 40 min    Activity Tolerance Patient tolerated treatment well    Behavior During Therapy [facility] for tasks assessed/performed           Past Medical History:  Diagnosis Date  . Arthritis    DJD right knee  . Benign prostatic hypertrophy with urinary frequency   . Bladder cancer ([place])   . Complication of anesthesia    gets combative in recovery  . COPD with emphysema ([place])   . Depression   . Dyspnea    when walking  . Dysrhythmia    a fib  . History of colon polyps   . History of kidney stones   . History of loop recorder    loop recorder in place battery is dead has not had changed due to covid  . History of pneumonia    hx Recurrent -- last bout Dec 2016 CAP-- per pt resolved  . 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 Hyperlipidemia   . Hypertension   . 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 Nephrolithiasis    right non-obstructive  per CT 05-02-2016  . Ocular migraine    controlled w/ verapamil  . PAF (paroxysmal  atrial fibrillation) ([place])    followed by AFIB clinic-- cardiologist-- dr Marlou Porch  . Pneumonia    history of pnu.  . Pulmonary nodule, left    left lower lobe x2 per CT 01-20-2016  . Renal cyst, left   . Stroke ([place])    TIA's  . Thoracic aortic aneurysm without rupture ([place])    ascending -- ct chest 01/10/16  4.8cm  . Wears hearing aid    bilateral-- wear at times    Past Surgical History:  Procedure Laterality Date  . ANTERIOR CERVICAL DECOMP/DISCECTOMY FUSION N/A 04/03/2017   Procedure: Cervical three-four, Cervical four-five Anterior cervical decompression/discectomy/fusion;  Surgeon: Erline Levine, MD;  Location: [place];  Service: Neurosurgery;  Laterality: N/A;  . CATARACT EXTRACTION W/ INTRAOCULAR LENS IMPLANT Right 2016  . CATARACT EXTRACTION W/PHACO  12/16/2012   Procedure: CATARACT EXTRACTION PHACO AND INTRAOCULAR LENS PLACEMENT (IOC);  Surgeon: Tonny Branch, MD;  Location: [facility];  Service: Ophthalmology;  Laterality: Left;  CDE:17.31  . COLONOSCOPY  10/03/2011   Procedure: COLONOSCOPY;  Surgeon: Jamesetta So;  [facility];  Service: Gastroenterology;  Laterality: N/A;  . CYSTOSCOPY/RETROGRADE/URETEROSCOPY/STONE EXTRACTION WITH BASKET Right 03/29/2020   Procedure: CYSTOSCOPY/RETROGRADE/URETEROSCOPY/STONE EXTRACTION WITH BASKET;  Surgeon: Franchot Gallo, MD;  Location: [facility];  Service: Urology;  Laterality: Right;  1 HR  . DECOMPRESSIOIN ULNAR NERVE AND CUBITAL TUNNEL RELEASE Left 07-14-2009   elbow  . EP IMPLANTABLE DEVICE N/A 08/10/2015   MDT ILR implanted by Dr Johney FrameAllred for cryptogenic stroke  . HOLMIUM LASER APPLICATION Right 03/29/2020   Procedure: HOLMIUM LASER APPLICATION;  Surgeon: Marcine Matarahlstedt, Stephen, MD;  Location: [facility];  Service: Urology;  Laterality: Right;  . INGUINAL HERNIA REPAIR Right 1990's  . KNEE ARTHROSCOPY Right 2015  . LEFT CUBITAL TUNNEL RELEASE    . POSTERIOR LUMBAR FUSION  2014   X2  . SHOULDER ARTHROSCOPY Left 1990's  . TEE WITHOUT  CARDIOVERSION N/A 07/27/2015   Procedure: TRANSESOPHAGEAL ECHOCARDIOGRAM (TEE);  Surgeon: Lewayne BuntingBrian S Crenshaw, MD;  [facility];  Service: Cardiovascular;  Laterality: N/A;   normal LV function, ef 55-60%,  mild AR and MR, mild dilated ascending aorta (4.2cm),  mild to moderate atherosclerosis  descending aorta,  mild to moderate TR,  negative saline microcavitation study  . THYROIDECTOMY Right 01/20/2015   Procedure: RIGHT THYROIDECTOMY;  Surgeon: Darletta MollSui W Teoh, MD;  [facility];  Service: ENT;  Laterality: Right;  . TONSILLECTOMY  as child  . TRANSURETHRAL RESECTION OF BLADDER TUMOR N/A 05/15/2016   Procedure: TRANSURETHRAL RESECTION OF BLADDER TUMOR (TURBT) AND INSTILLATION OF EPIRUBICIN;  Surgeon: Marcine MatarStephen Dahlstedt, MD;  [facility];  Service: Urology;  Laterality: N/A;  . TRANSURETHRAL RESECTION OF BLADDER TUMOR N/A 03/29/2020   Procedure: TRANSURETHRAL RESECTION OF BLADDER TUMOR (TURBT);  Surgeon: Marcine Matarahlstedt, Stephen, MD;  [facility];  Service: Urology;  Laterality: N/A;    There were no vitals filed for this visit.    Subjective Assessment - 12/16/20 1004    Subjective Patient with L THR on 12/21 via anterior approach and presents today with heavy labored gait pattern and 5/10 pain. Patient reports ambulation with cane at PLOF due to hx of hip pain/dysfunction.  Patient reports difficulty with all ADL and mobility at this time requiring caregiver assistance    Patient is accompained by: Family member   spouse   Currently in Pain? Yes    Pain Score 5     Pain Location Hip    Pain Orientation Left    Pain Descriptors / Indicators Aching;Sore    Pain Type Surgical pain    Pain Onset In the past 7 days              [facility] PT Assessment - 12/16/20 0001      Assessment   Medical Diagnosis L THR anterior approach    Referring Provider (PT) Dr. Margarita Ranaimothy Murphy    Onset Date/Surgical Date 12/14/20    Next MD Visit 12/29/20      Precautions    Precautions Anterior Hip      Restrictions   Weight Bearing Restrictions Yes    LLE Weight Bearing Weight bearing as tolerated      Balance Screen   Has the patient fallen in the past 6 months Yes    How many times? 1    Has the patient had a decrease in activity level because of a fear of falling?  No    Is the patient reluctant to leave their home because of a fear of falling?  No      Home Environment   Living Environment Private residence    Living Arrangements Spouse/significant other    Available Help at Discharge Family    Type of Home  House    Home Access Stairs to enter    Entrance Stairs-Number of Steps 2-3    Entrance Stairs-Rails Can reach both    [place] - 2 wheels;Bedside commode   long handled shoe horn     Prior Function   Level of Independence Requires assistive device for independence   cane prior to surgery   Vocation Retired    [employer] retired Scientist, research (life sciences)      Observation/Other Assessments   Focus on Therapeutic Outcomes (FOTO)  18% function      ROM / Strength   AROM / PROM / Strength AROM;PROM;Strength      AROM   Right/Left Hip Left    Left Hip Extension 10   lacking   Left Hip Flexion 60   with belt for assistance     Strength   Right Knee Flexion 5/5    Right Knee Extension 5/5    Left Knee Flexion 3-/5    Left Knee Extension 3-/5      Ambulation/Gait   Ambulation Distance (Feet) 75 Feet    Gait Comments 2MWT- see details below in treatment                      Objective measurements completed on examination: See above findings.       [facility] Adult PT Treatment/Exercise - 12/16/20 0001      Bed Mobility   Bed Mobility Supine to Sit;Sit to Supine    Supine to Sit Moderate Assistance - Patient 50-74%    Sit to Supine Moderate Assistance - Patient 50-74%      Transfers   Transfers Sit to Stand    Sit to Stand 4: Min assist    Transfer Cueing cues for set-up and  sequence      Ambulation/Gait   Ambulation/Gait Yes    Ambulation/Gait Assistance 4: Min guard    Assistive device Rolling walker    Gait Pattern Step-to pattern;Antalgic    Ambulation Surface Level    Gait velocity decreased      Exercises   Exercises Knee/Hip      Knee/Hip Exercises: Supine   Quad Sets Strengthening;Left;20 reps    Heel Slides AAROM;Left;20 reps                  PT Education - 12/16/20 1028    Education Details Pt education in HEP details and use of ice for pain control at home    Person(s) Educated Patient    Methods Explanation    Comprehension Verbalized understanding;Returned demonstration            PT Short Term Goals - 12/16/20 1042      PT SHORT TERM GOAL #1   Title Pt will be independent with HEP and perform consistently in order to promote return to PLOF.    Baseline in development    Time 3    Period Weeks    Status New    Target Date 01/06/21      PT SHORT TERM GOAL #2   Title Patient will demonstrate bed mobility with Supervision to  decrease level of assistance from caregivers    Baseline mod A    Time 3    Period Weeks    Status New    Target Date 01/06/21      PT SHORT TERM GOAL #3   Title Patient will demonstrate transfers with supervision to decrease level  of care    Baseline Min A for sit to stand    Time 3    Period Weeks    Status New    Target Date 01/06/21      PT SHORT TERM GOAL #4   Title Patient will demonstrate improved ambulation tolerance as evidenced by distacne of 200 ft during 2MWT using least restrictive AD    Baseline 75 ft w/ RW and antalgic pattern with step-to pattern    Time 3    Period Weeks    Status New    Target Date 01/06/21             PT Long Term Goals - 12/16/20 1045      PT LONG TERM GOAL #1   Title Patient will demonstrate 4/5 RLE to improve gait mechanics    Baseline 3-/5 left knee    Time 6    Period Weeks    Status New    Target Date 01/27/21      PT LONG TERM  GOAL #2   Title Patient will improve on FOTO score to meet predicted outcomes to improve functional independence    Baseline 18% function    Time 6    Period Weeks    Status New    Target Date 01/27/21                  Plan - 12/16/20 1031    Clinical Impression Statement Patient exhibits difficulty in walking, new onset of pain, decrease in functional mobility, increased level of assistance from caregivers and decrease in level of mobility as a result of recent surgery and requires PT services to increase indepedence with bed mobility, transfers, and ambulation with decreased level of assistance in order to restore capabilities to PLOF to aid in post-op recovery    Personal Factors and Comorbidities Age;Comorbidity 3+;Fitness    Comorbidities see PMH    Examination-Activity Limitations Bathing;Bed Mobility;Bend;Carry;Dressing;Lift;Toileting;Stand;Stairs;Squat;Locomotion Level;Transfers    Examination-Participation Restrictions Cleaning;Community Activity;Driving;Laundry;Yard Work;Shop;Occupation    Stability/Clinical Decision Making Stable/Uncomplicated    Clinical Decision Making Low    Rehab Potential Excellent    PT Frequency 3x / week    PT Duration 6 weeks    PT Treatment/Interventions ADLs/Self Care Home Management;Aquatic Therapy;Biofeedback;Cryotherapy;Electrical Stimulation;DME Instruction;Ultrasound;Moist Heat;Gait training;Stair training;Functional mobility training;Therapeutic activities;Therapeutic exercise;Balance training;Patient/family education;Neuromuscular re-education;Manual techniques;Passive range of motion;Taping;Energy conservation;Dry needling;Spinal Manipulations;Joint Manipulations    PT Next Visit Plan Continue with LE AROM, strengthening, and gait training    PT Home Exercise Plan quad sets, heel slides, ambulation with RW           Patient will benefit from skilled therapeutic intervention in order to improve the following deficits and impairments:   Abnormal gait,Decreased activity tolerance,Decreased balance,Decreased mobility,Decreased knowledge of use of DME,Decreased knowledge of precautions,Decreased endurance,Decreased coordination,Decreased range of motion,Decreased safety awareness,Decreased strength,Difficulty walking,Impaired perceived functional ability,Improper body mechanics,Pain  Visit Diagnosis: Difficulty in walking, not elsewhere classified  Muscle weakness (generalized)  Pain in left leg     Problem List Patient Active Problem List   Diagnosis Date Noted  . 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  . Encounter for pre-operative respiratory clearance 11/16/2020  . 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 COPD with chronic bronchitis and emphysema ([place]) 09/22/2020  . Penile pain 04/07/2020  . Balanitis 04/07/2020  . Cervical myelopathy ([place]) 04/03/2017  . Thoracic ascending aortic aneurysm ([place]) 05/09/2016  . Thoracic aortic aneurysm without rupture ([place]) 01/17/2016  . Paroxysmal atrial fibrillation ([place]) 12/17/2015  .  S/P partial thyroidectomy 01/20/2015    10:59 AM, 12/16/20 M. Sherlyn Lees, PT, DPT Physical Therapist-  Office Number: [phone]  [facility] [address] [place], [place], [address] Phone: [phone]   Fax:  [phone]  Name: Kyle Owen MRN: [id number] Date of Birth: April 21, 1941

## 2020-12-20 ENCOUNTER — Ambulatory Visit (HOSPITAL_COMMUNITY): Payer: Medicare Other | Admitting: Physical Therapy

## 2020-12-20 ENCOUNTER — Other Ambulatory Visit: Payer: Self-pay

## 2020-12-20 ENCOUNTER — Encounter (HOSPITAL_COMMUNITY): Payer: Self-pay | Admitting: Physical Therapy

## 2020-12-20 DIAGNOSIS — M6281 Muscle weakness (generalized): Secondary | ICD-10-CM | POA: Diagnosis not present

## 2020-12-20 DIAGNOSIS — M79605 Pain in left leg: Secondary | ICD-10-CM | POA: Diagnosis not present

## 2020-12-20 DIAGNOSIS — R262 Difficulty in walking, not elsewhere classified: Secondary | ICD-10-CM

## 2020-12-20 NOTE — Therapy (Signed)
[place], [place], [address] Phone: [phone]   Fax:  [phone]  Physical Therapy Treatment  Patient Details  Name: Kyle Owen MRN: [id number] Date of Birth: 1941-09-18 Referring Provider (PT): Dr. [name]   Encounter Date: 12/20/2020   PT End of Session - 12/20/20 0958    Visit Number 2    Number of Visits 18    Date for PT Re-Evaluation 01/27/21    Authorization Type Medicare, 75 combined visits PT/OT/ST    Authorization - Visit Number 2    Authorization - Number of Visits 60    Progress Note Due on Visit 10    PT Start Time 1000    PT Stop Time 1040    PT Time Calculation (min) 40 min    Activity Tolerance Patient tolerated treatment well    Behavior During Therapy [facility] for tasks assessed/performed           Past Medical History:  Diagnosis Date  . Arthritis    DJD right knee  . Benign prostatic hypertrophy with urinary frequency   . Bladder cancer ([place])   . Complication of anesthesia    gets combative in recovery  . COPD with emphysema ([place])   . Depression   . Dyspnea    when walking  . Dysrhythmia    a fib  . History of colon polyps   . History of kidney stones   . History of loop recorder    loop recorder in place battery is dead has not had changed due to covid  . History of pneumonia    hx Recurrent -- last bout Dec 2016 CAP-- per pt resolved  . 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 Hyperlipidemia   . Hypertension   . 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 Nephrolithiasis    right non-obstructive  per CT 05-02-2016  . Ocular migraine    controlled w/ verapamil  . PAF (paroxysmal  atrial fibrillation) ([place])    followed by AFIB clinic-- cardiologist-- dr Marlou Porch  . Pneumonia    history of pnu.  . Pulmonary nodule, left    left lower lobe x2 per CT 01-20-2016  . Renal cyst, left   . Stroke ([place])    TIA's  . Thoracic aortic aneurysm without rupture ([place])    ascending -- ct chest 01/10/16  4.8cm  . Wears hearing aid    bilateral-- wear at times    Past Surgical History:  Procedure Laterality Date  . ANTERIOR CERVICAL DECOMP/DISCECTOMY FUSION N/A 04/03/2017   Procedure: Cervical three-four, Cervical four-five Anterior cervical decompression/discectomy/fusion;  Surgeon: Erline Levine, MD;  Location: [place];  Service: Neurosurgery;  Laterality: N/A;  . CATARACT EXTRACTION W/ INTRAOCULAR LENS IMPLANT Right 2016  . CATARACT EXTRACTION W/PHACO  12/16/2012   Procedure: CATARACT EXTRACTION PHACO AND INTRAOCULAR LENS PLACEMENT (IOC);  Surgeon: Tonny Branch, MD;  [facility];  Service: Ophthalmology;  Laterality: Left;  CDE:17.31  . COLONOSCOPY  10/03/2011   Procedure: COLONOSCOPY;  Surgeon: Jamesetta So;  [facility];  Service: Gastroenterology;  Laterality: N/A;  . CYSTOSCOPY/RETROGRADE/URETEROSCOPY/STONE EXTRACTION WITH BASKET Right 03/29/2020   Procedure: CYSTOSCOPY/RETROGRADE/URETEROSCOPY/STONE EXTRACTION WITH BASKET;  Surgeon: Franchot Gallo, MD;  [facility];  Service: Urology;  Laterality: Right;  1 HR  . DECOMPRESSIOIN ULNAR NERVE AND CUBITAL TUNNEL RELEASE Left 07-14-2009   elbow  . 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 HOLMIUM LASER APPLICATION Right [id number]   Procedure: HOLMIUM LASER APPLICATION;  Surgeon: Franchot Gallo, MD;  [facility];  Service: Urology;  Laterality: Right;  . INGUINAL HERNIA REPAIR Right 1990's  . KNEE ARTHROSCOPY Right 2015  . LEFT CUBITAL TUNNEL RELEASE    . POSTERIOR LUMBAR FUSION  2014   X2  . SHOULDER ARTHROSCOPY Left 1990's  . TEE WITHOUT  CARDIOVERSION N/A 07/27/2015   Procedure: TRANSESOPHAGEAL ECHOCARDIOGRAM (TEE);  Surgeon: Lelon Perla, MD;  Location[facility];  Service: Cardiovascular;  Laterality: N/A;   normal LV function, ef 55-60%,  mild AR and MR, mild dilated ascending aorta (4.2cm),  mild to moderate atherosclerosis  descending aorta,  mild to moderate TR,  negative saline microcavitation study  . THYROIDECTOMY Right 01/20/2015   Procedure: RIGHT THYROIDECTOMY;  Surgeon: Ascencion Dike, MD;  Location: [place];  Service: ENT;  Laterality: Right;  . TONSILLECTOMY  as child  . TOTAL HIP ARTHROPLASTY Left 12/14/2020   Procedure: TOTAL HIP ARTHROPLASTY ANTERIOR APPROACH;  Surgeon: [name], MD;  [facility];  Service: Orthopedics;  Laterality: Left;  . TRANSURETHRAL RESECTION OF BLADDER TUMOR N/A 05/15/2016   Procedure: TRANSURETHRAL RESECTION OF BLADDER TUMOR (TURBT) AND INSTILLATION OF EPIRUBICIN;  Surgeon: Franchot Gallo, MD;  Location: [facility];  Service: Urology;  Laterality: N/A;  . TRANSURETHRAL RESECTION OF BLADDER TUMOR N/A 03/29/2020   Procedure: TRANSURETHRAL RESECTION OF BLADDER TUMOR (TURBT);  Surgeon: Franchot Gallo, MD;  [facility];  Service: Urology;  Laterality: N/A;    There were no vitals filed for this visit.   Subjective Assessment - 12/20/20 1007    Subjective States he is doing alright. Reports he currently has 8/10 pain with transitional movements along the front of the thigh described as excruciating. Walking around his pain was like 3/10. States he walked outside 2 days. Wife present, to help as patient is hard of hearing. States he is doing some exercises everyday.Reports moving in bed is still challenging. States he also has difficulties getting in and out of the car    Patient is accompained by: Family member   spouse   Pain Onset In the past 7 days              [facility] PT Assessment - 12/20/20 0001      Assessment   Medical Diagnosis L THR anterior  approach    Referring Provider (PT) Dr. [name]      Observation/Other Assessments-Edema    Edema --   pitting edema 4+ L                        [facility] Adult PT Treatment/Exercise - 12/20/20 0001      Ambulation/Gait   Ambulation/Gait Yes    Ambulation/Gait Assistance 5: Supervision    Ambulation Distance (Feet) 226 Feet    Assistive device Rolling walker    Ambulation Surface Level    Gait velocity decreased    Gait Comments cues to stand up tall; one rest break      Knee/Hip Exercises: Supine   Heel Slides AROM;Left   x30 5" holds   Hip Adduction Isometric AROM;20 reps   5" holds  with towel roll   Other Supine Knee/Hip Exercises hip abd isometric with belt x20 5" holds    Other Supine Knee/Hip Exercises hip extension isometric with leg elevated x15 5"                  PT Education - 12/20/20 1035    Education Details Educated patient and wife in swelling, elevation, continued use of compression garments and frequent elevation.adjusted walker and educated patietn on height of walker    Person(s) Educated Patient    Methods Explanation    Comprehension Verbalized understanding            PT Short Term Goals - 12/16/20 1042      PT SHORT TERM GOAL #1   Title Pt will be independent with HEP and perform consistently in order to promote return to PLOF.    Baseline in development    Time 3    Period Weeks    Status New    Target Date 01/06/21      PT SHORT TERM GOAL #2   Title Patient will demonstrate bed mobility with Supervision to  decrease level of assistance from caregivers    Baseline mod A    Time 3    Period Weeks    Status New    Target Date 01/06/21      PT SHORT TERM GOAL #3   Title Patient will demonstrate transfers with supervision to decrease level of care    Baseline Min A for sit to stand    Time 3    Period Weeks    Status New    Target Date 01/06/21      PT SHORT TERM GOAL #4   Title Patient will demonstrate  improved ambulation tolerance as evidenced by distacne of 200 ft during using least restrictive AD    Baseline 75 ft w/ RW and antalgic pattern with step-to pattern    Time 3    Period Weeks    Status New    Target Date 01/06/21             PT Long Term Goals - 12/16/20 1045      PT LONG TERM GOAL #1   Title Patient will demonstrate 4/5 RLE to improve gait mechanics    Baseline 3-/5 left knee    Time 6    Period Weeks    Status New    Target Date 01/27/21      PT LONG TERM GOAL #2   Title Patient will improve on FOTO score to meet predicted outcomes to improve functional independence    Baseline 18% function    Time 6    Period Weeks    Status New    Target Date 01/27/21                 Plan - 12/20/20 7782    Clinical Impression Statement Patient with pitting edema 4+ on left lower extremity. Educated patient on in importance of elevation, ankle pumps and managing swelling especially given patient cardiac history. Educated spouse and patient on signs and symptoms of DVT and what to do if they suspect DVT. Progress HEP and improved knee extension noted in supine. Will continue to work on [employer], gait and edema management.    Personal Factors and Comorbidities Age;Comorbidity 3+;Fitness    Comorbidities see PMH    Examination-Activity Limitations Bathing;Bed Mobility;Bend;Carry;Dressing;Lift;Toileting;Stand;Stairs;Squat;Locomotion Level;Transfers    Examination-Participation Restrictions Cleaning;Community Activity;Driving;Laundry;Yard Work;Shop;Occupation    Stability/Clinical Decision  Making Stable/Uncomplicated    Rehab Potential Excellent    PT Frequency 3x / week    PT Duration 6 weeks    PT Treatment/Interventions ADLs/Self Care Home Management;Aquatic Therapy;Biofeedback;Cryotherapy;Electrical Stimulation;DME Instruction;Ultrasound;Moist Heat;Gait training;Stair training;Functional mobility training;Therapeutic activities;Therapeutic  exercise;Balance training;Patient/family education;Neuromuscular re-education;Manual techniques;Passive range of motion;Taping;Energy conservation;Dry needling;Spinal Manipulations;Joint Manipulations    PT Next Visit Plan Continue with LE AROM, strengthening, and gait training    PT Home Exercise Plan quad sets, heel slides, ambulation with RW; 12/27 heel slides, hip abd/add/extension isometrics           Patient will benefit from skilled therapeutic intervention in order to improve the following deficits and impairments:  Abnormal gait,Decreased activity tolerance,Decreased balance,Decreased mobility,Decreased knowledge of use of DME,Decreased knowledge of precautions,Decreased endurance,Decreased coordination,Decreased range of motion,Decreased safety awareness,Decreased strength,Difficulty walking,Impaired perceived functional ability,Improper body mechanics,Pain  Visit Diagnosis: Difficulty in walking, not elsewhere classified  Muscle weakness (generalized)  Pain in left leg     Problem List Patient Active Problem List   Diagnosis Date Noted  . 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  . Encounter for pre-operative respiratory clearance 11/16/2020  . 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 COPD with chronic bronchitis and emphysema ([place]) 09/22/2020  . Penile pain 04/07/2020  . Balanitis 04/07/2020  . Cervical myelopathy ([place]) 04/03/2017  . Thoracic ascending aortic aneurysm ([place]) 05/09/2016  . Thoracic aortic aneurysm without rupture ([place]) 01/17/2016  . Paroxysmal atrial fibrillation ([place]) 12/17/2015  . S/P partial thyroidectomy 01/20/2015   10:47 AM, 12/20/20 Jerene Pitch, DPT Physical Therapy with [facility]  [phone] office  [place] [address] [place], [place], [address] Phone: [phone]   Fax:  [phone]  Name: Kyle Owen MRN: [id number] Date of Birth:  Feb 07, 1941

## 2020-12-20 NOTE — Patient Instructions (Signed)
Access Code: [facility] URL: [email or web address] Date: 12/20/2020 Prepared by: Revonda Humphrey  Exercises Supine Heel Slide - 1 x daily - 7 x weekly - 3 sets - 10 reps Supine Quadricep Sets - 1 x daily - 7 x weekly - 3 sets - 10 reps - 5 hold Hooklying Isometric Hip Abduction with Belt - 1 x daily - 7 x weekly - 3 sets - 10 reps - 5 hold Supine Hip Adduction Isometric with Ball - 1 x daily - 7 x weekly - 3 sets - 10 reps - 5 hold

## 2020-12-22 ENCOUNTER — Encounter (HOSPITAL_COMMUNITY): Payer: Self-pay | Admitting: Physical Therapy

## 2020-12-22 ENCOUNTER — Ambulatory Visit (HOSPITAL_COMMUNITY): Payer: Medicare Other | Admitting: Physical Therapy

## 2020-12-22 ENCOUNTER — Other Ambulatory Visit: Payer: Self-pay

## 2020-12-22 DIAGNOSIS — M79605 Pain in left leg: Secondary | ICD-10-CM | POA: Diagnosis not present

## 2020-12-22 DIAGNOSIS — R262 Difficulty in walking, not elsewhere classified: Secondary | ICD-10-CM

## 2020-12-22 DIAGNOSIS — M6281 Muscle weakness (generalized): Secondary | ICD-10-CM

## 2020-12-22 NOTE — Therapy (Signed)
[place], [place], [address] Phone: [phone]   Fax:  [phone]  Physical Therapy Treatment  Patient Details  Name: Kyle Owen MRN: [id number] Date of Birth: 01/25/1941 Referring Provider (PT): Dr. [name]   Encounter Date: 12/22/2020   PT End of Session - 12/22/20 1049    Visit Number 3    Number of Visits 18    Date for PT Re-Evaluation 01/27/21    Authorization Type Medicare, 31 combined visits PT/OT/ST    Authorization - Visit Number 3    Authorization - Number of Visits 60    Progress Note Due on Visit 10    PT Start Time [id number]    PT Stop Time 1127    PT Time Calculation (min) 38 min    Activity Tolerance Patient tolerated treatment well    Behavior During Therapy [facility] for tasks assessed/performed           Past Medical History:  Diagnosis Date  . Arthritis    DJD right knee  . Benign prostatic hypertrophy with urinary frequency   . Bladder cancer ([place])   . Complication of anesthesia    gets combative in recovery  . COPD with emphysema ([place])   . Depression   . Dyspnea    when walking  . Dysrhythmia    a fib  . History of colon polyps   . History of kidney stones   . History of loop recorder    loop recorder in place battery is dead has not had changed due to covid  . History of pneumonia    hx Recurrent -- last bout Dec 2016 CAP-- per pt resolved  . 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 Hyperlipidemia   . Hypertension   . 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 Nephrolithiasis    right non-obstructive  per CT 05-02-2016  . Ocular migraine    controlled w/ verapamil  . PAF (paroxysmal  atrial fibrillation) ([place])    followed by AFIB clinic-- cardiologist-- dr Marlou Porch  . Pneumonia    history of pnu.  . Pulmonary nodule, left    left lower lobe x2 per CT 01-20-2016  . Renal cyst, left   . Stroke ([place])    TIA's  . Thoracic aortic aneurysm without rupture ([place])    ascending -- ct chest 01/10/16  4.8cm  . Wears hearing aid    bilateral-- wear at times    Past Surgical History:  Procedure Laterality Date  . ANTERIOR CERVICAL DECOMP/DISCECTOMY FUSION N/A 04/03/2017   Procedure: Cervical three-four, Cervical four-five Anterior cervical decompression/discectomy/fusion;  Surgeon: Erline Levine, MD;  Location: [place];  Service: Neurosurgery;  Laterality: N/A;  . CATARACT EXTRACTION W/ INTRAOCULAR LENS IMPLANT Right 2016  . CATARACT EXTRACTION W/PHACO  12/16/2012   Procedure: CATARACT EXTRACTION PHACO AND INTRAOCULAR LENS PLACEMENT (IOC);  Surgeon: Tonny Branch, MD;  [facility];  Service: Ophthalmology;  Laterality: Left;  CDE:17.31  . COLONOSCOPY  10/03/2011   Procedure: COLONOSCOPY;  Surgeon: Jamesetta So;  [facility];  Service: Gastroenterology;  Laterality: N/A;  . CYSTOSCOPY/RETROGRADE/URETEROSCOPY/STONE EXTRACTION WITH BASKET Right 03/29/2020   Procedure: CYSTOSCOPY/RETROGRADE/URETEROSCOPY/STONE EXTRACTION WITH BASKET;  Surgeon: Franchot Gallo, MD;  [facility];  Service: Urology;  Laterality: Right;  1 HR  . DECOMPRESSIOIN ULNAR NERVE AND CUBITAL TUNNEL RELEASE Left 07-14-2009   elbow  . 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 HOLMIUM LASER APPLICATION Right [id number]   Procedure: HOLMIUM LASER APPLICATION;  Surgeon: Franchot Gallo, MD;  [facility];  Service: Urology;  Laterality: Right;  . INGUINAL HERNIA REPAIR Right 1990's  . KNEE ARTHROSCOPY Right 2015  . LEFT CUBITAL TUNNEL RELEASE    . POSTERIOR LUMBAR FUSION  2014   X2  . SHOULDER ARTHROSCOPY Left 1990's  . TEE WITHOUT  CARDIOVERSION N/A 07/27/2015   Procedure: TRANSESOPHAGEAL ECHOCARDIOGRAM (TEE);  Surgeon: Lelon Perla, MD;  Location: [facility];  Service: Cardiovascular;  Laterality: N/A;   normal LV function, ef 55-60%,  mild AR and MR, mild dilated ascending aorta (4.2cm),  mild to moderate atherosclerosis  descending aorta,  mild to moderate TR,  negative saline microcavitation study  . THYROIDECTOMY Right 01/20/2015   Procedure: RIGHT THYROIDECTOMY;  Surgeon: Ascencion Dike, MD;  Location: [place];  Service: ENT;  Laterality: Right;  . TONSILLECTOMY  as child  . TOTAL HIP ARTHROPLASTY Left 12/14/2020   Procedure: TOTAL HIP ARTHROPLASTY ANTERIOR APPROACH;  Surgeon: [name], MD;  [facility];  Service: Orthopedics;  Laterality: Left;  . TRANSURETHRAL RESECTION OF BLADDER TUMOR N/A 05/15/2016   Procedure: TRANSURETHRAL RESECTION OF BLADDER TUMOR (TURBT) AND INSTILLATION OF EPIRUBICIN;  Surgeon: Franchot Gallo, MD;  Location[facility];  Service: Urology;  Laterality: N/A;  . TRANSURETHRAL RESECTION OF BLADDER TUMOR N/A 03/29/2020   Procedure: TRANSURETHRAL RESECTION OF BLADDER TUMOR (TURBT);  Surgeon: Franchot Gallo, MD;  [facility];  Service: Urology;  Laterality: N/A;    There were no vitals filed for this visit.   Subjective Assessment - 12/22/20 1050    Subjective Patient doesn't feel much different than the other day. He fell off the love seat and had trouble getting back up. He has been elevating and walking.    Patient is accompained by: Family member   spouse   Currently in Pain? No/denies    Pain Onset In the past 7 days                             [facility] Adult PT Treatment/Exercise - 12/22/20 0001      Knee/Hip Exercises: Seated   Long Arc Quad 10 reps    Long [employer] Limitations 5 second holds      Knee/Hip Exercises: Supine   Short Arc [employer] 5 reps    Short Arc Quad Sets Limitations 5 second holds    Heel Slides AAROM;Left;1  set;10 reps    Heel Slides Limitations 5x PT AAROM, 10 x with strap AAROM    Bridges Both;2 sets;10 reps    Bridges Limitations 5 second holds    Other Supine Knee/Hip Exercises glute sets 5 5 second holds                  PT Education - 12/22/20 1049    Education Details Patient educated on HEP, exercise mechanics, how to get up from a fall    Person(s) Educated Patient    Methods Explanation;Demonstration    Comprehension Verbalized understanding;Returned demonstration            PT Short Term Goals - 12/16/20 1042  PT SHORT TERM GOAL #1   Title Pt will be independent with HEP and perform consistently in order to promote return to PLOF.    Baseline in development    Time 3    Period Weeks    Status New    Target Date 01/06/21      PT SHORT TERM GOAL #2   Title Patient will demonstrate bed mobility with Supervision to  decrease level of assistance from caregivers    Baseline mod A    Time 3    Period Weeks    Status New    Target Date 01/06/21      PT SHORT TERM GOAL #3   Title Patient will demonstrate transfers with supervision to decrease level of care    Baseline Min A for sit to stand    Time 3    Period Weeks    Status New    Target Date 01/06/21      PT SHORT TERM GOAL #4   Title Patient will demonstrate improved ambulation tolerance as evidenced by distacne of 200 ft during using least restrictive AD    Baseline 75 ft w/ RW and antalgic pattern with step-to pattern    Time 3    Period Weeks    Status New    Target Date 01/06/21             PT Long Term Goals - 12/16/20 1045      PT LONG TERM GOAL #1   Title Patient will demonstrate 4/5 RLE to improve gait mechanics    Baseline 3-/5 left knee    Time 6    Period Weeks    Status New    Target Date 01/27/21      PT LONG TERM GOAL #2   Title Patient will improve on FOTO score to meet predicted outcomes to improve functional independence    Baseline 18% function    Time 6     Period Weeks    Status New    Target Date 01/27/21                 Plan - 12/22/20 1049    Clinical Impression Statement Patient and his wife are educated on easiest way to transfer from floor after a fall as patient slid off couch yesterday. Patient given cueing for increasing height of bridge with good carry over. Patient unable to complete supine march secondary to impaired hip flexor strength. Assisted patient with several reps of hip flexion and then educated patient on performing hip flexion AAROM with heel slides for performing hip flexor strength and activation. Patient will continue to benefit from skilled physical therapy in order to reduce impairment and improve function.    Personal Factors and Comorbidities Age;Comorbidity 3+;Fitness    Comorbidities see PMH    Examination-Activity Limitations Bathing;Bed Mobility;Bend;Carry;Dressing;Lift;Toileting;Stand;Stairs;Squat;Locomotion Level;Transfers    Examination-Participation Restrictions Cleaning;Community Activity;Driving;Laundry;Yard Work;Shop;Occupation    Stability/Clinical Decision Making Stable/Uncomplicated    Rehab Potential Excellent    PT Frequency 3x / week    PT Duration 6 weeks    PT Treatment/Interventions ADLs/Self Care Home Management;Aquatic Therapy;Biofeedback;Cryotherapy;Electrical Stimulation;DME Instruction;Ultrasound;Moist Heat;Gait training;Stair training;Functional mobility training;Therapeutic activities;Therapeutic exercise;Balance training;Patient/family education;Neuromuscular re-education;Manual techniques;Passive range of motion;Taping;Energy conservation;Dry needling;Spinal Manipulations;Joint Manipulations    PT Next Visit Plan Continue with LE AROM, strengthening, and gait training    PT Home Exercise Plan quad sets, heel slides, ambulation with RW; 12/27 heel slides, hip abd/add/extension isometrics  Patient will benefit from skilled therapeutic intervention in order to improve the  following deficits and impairments:  Abnormal gait,Decreased activity tolerance,Decreased balance,Decreased mobility,Decreased knowledge of use of DME,Decreased knowledge of precautions,Decreased endurance,Decreased coordination,Decreased range of motion,Decreased safety awareness,Decreased strength,Difficulty walking,Impaired perceived functional ability,Improper body mechanics,Pain  Visit Diagnosis: Difficulty in walking, not elsewhere classified  Muscle weakness (generalized)  Pain in left leg     Problem List Patient Active Problem List   Diagnosis Date Noted  . 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  . Encounter for pre-operative respiratory clearance 11/16/2020  . 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 COPD with chronic bronchitis and emphysema ([place]) 09/22/2020  . Penile pain 04/07/2020  . Balanitis 04/07/2020  . Cervical myelopathy ([place]) 04/03/2017  . Thoracic ascending aortic aneurysm ([place]) 05/09/2016  . Thoracic aortic aneurysm without rupture ([place]) 01/17/2016  . Paroxysmal atrial fibrillation ([place]) 12/17/2015  . S/P partial thyroidectomy 01/20/2015     11:31 AM, 12/22/20 Mearl Latin PT, DPT Physical Therapist at [place], [place], [address] Phone: [phone]   Fax:  [phone]  Name: Kyle Owen MRN: [id number] Date of Birth: 30-Jan-1941

## 2020-12-22 NOTE — Patient Instructions (Signed)
Access Code: [email or web address] URL: [email or web address] Date: 12/22/2020 Prepared by: [facility] Charman Blasco  Exercises Seated Long Arc Quad - 3 x daily - 7 x weekly - 1 sets - 10 reps - 5 second hold

## 2020-12-23 ENCOUNTER — Encounter (HOSPITAL_COMMUNITY): Payer: Self-pay

## 2020-12-23 ENCOUNTER — Ambulatory Visit (HOSPITAL_COMMUNITY): Payer: Medicare Other

## 2020-12-23 DIAGNOSIS — M79605 Pain in left leg: Secondary | ICD-10-CM | POA: Diagnosis not present

## 2020-12-23 DIAGNOSIS — M6281 Muscle weakness (generalized): Secondary | ICD-10-CM

## 2020-12-23 DIAGNOSIS — R262 Difficulty in walking, not elsewhere classified: Secondary | ICD-10-CM

## 2020-12-23 NOTE — Therapy (Signed)
[place], [place], [address] Phone: [phone]   Fax:  [phone]  Physical Therapy Treatment  Patient Details  Name: Kyle Owen MRN: [id number] Date of Birth: [birth year] Referring Provider (PT): Dr. [name]   Encounter Date: 12/23/2020   PT End of Session - 12/23/20 1505    Visit Number 4    Number of Visits 18    Date for PT Re-Evaluation 01/27/21    Authorization Type Medicare, 95 combined visits PT/OT/ST    Authorization - Visit Number 4    Authorization - Number of Visits 60    Progress Note Due on Visit 10    PT Start Time 1450    PT Stop Time 1533    PT Time Calculation (min) 43 min    Activity Tolerance Patient tolerated treatment well    Behavior During Therapy [facility] for tasks assessed/performed           Past Medical History:  Diagnosis Date  . Arthritis    DJD right knee  . Benign prostatic hypertrophy with urinary frequency   . Bladder cancer ([place])   . Complication of anesthesia    gets combative in recovery  . COPD with emphysema ([place])   . Depression   . Dyspnea    when walking  . Dysrhythmia    a fib  . History of colon polyps   . History of kidney stones   . History of loop recorder    loop recorder in place battery is dead has not had changed due to covid  . History of pneumonia    hx Recurrent -- last bout Dec 2016 CAP-- per pt resolved  . 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 Hyperlipidemia   . Hypertension   . 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 Nephrolithiasis    right non-obstructive  per CT 05-02-2016  . Ocular migraine    controlled w/ verapamil  . PAF (paroxysmal  atrial fibrillation) ([place])    followed by AFIB clinic-- cardiologist-- dr Marlou Porch  . Pneumonia    history of pnu.  . Pulmonary nodule, left    left lower lobe x2 per CT 01-20-2016  . Renal cyst, left   . Stroke ([place])    TIA's  . Thoracic aortic aneurysm without rupture ([place])    ascending -- ct chest 01/10/16  4.8cm  . Wears hearing aid    bilateral-- wear at times    Past Surgical History:  Procedure Laterality Date  . ANTERIOR CERVICAL DECOMP/DISCECTOMY FUSION N/A 04/03/2017   Procedure: Cervical three-four, Cervical four-five Anterior cervical decompression/discectomy/fusion;  Surgeon: Erline Levine, MD;  Location: [place];  Service: Neurosurgery;  Laterality: N/A;  . CATARACT EXTRACTION W/ INTRAOCULAR LENS IMPLANT Right 2016  . CATARACT EXTRACTION W/PHACO  12/16/2012   Procedure: CATARACT EXTRACTION PHACO AND INTRAOCULAR LENS PLACEMENT (IOC);  Surgeon: Tonny Branch, MD;  [facility];  Service: Ophthalmology;  Laterality: Left;  CDE:17.31  . COLONOSCOPY  10/03/2011   Procedure: COLONOSCOPY;  Surgeon: Jamesetta So;  [facility];  Service: Gastroenterology;  Laterality: N/A;  . CYSTOSCOPY/RETROGRADE/URETEROSCOPY/STONE EXTRACTION WITH BASKET Right 03/29/2020   Procedure: CYSTOSCOPY/RETROGRADE/URETEROSCOPY/STONE EXTRACTION WITH BASKET;  Surgeon: Franchot Gallo, MD;  [facility];  Service: Urology;  Laterality: Right;  1 HR  . DECOMPRESSIOIN ULNAR NERVE AND CUBITAL TUNNEL RELEASE Left 07-14-2009   elbow  . EP IMPLANTABLE DEVICE N/A 08/10/2015   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  . HOLMIUM LASER APPLICATION Right 03/29/2020   Procedure: HOLMIUM LASER APPLICATION;  Surgeon: Marcine Matar, MD;  [facility];  Service: Urology;  Laterality: Right;  . INGUINAL HERNIA REPAIR Right 1990's  . KNEE ARTHROSCOPY Right 2015  . LEFT CUBITAL TUNNEL RELEASE    . POSTERIOR LUMBAR FUSION  2014   X2  . SHOULDER ARTHROSCOPY Left 1990's  . TEE WITHOUT  CARDIOVERSION N/A 07/27/2015   Procedure: TRANSESOPHAGEAL ECHOCARDIOGRAM (TEE);  Surgeon: Lewayne Bunting, MD;  Location: [facility] END[facility]COPY;  Service: Cardiovascular;  Laterality: N/A;   normal LV function, ef 55-60%,  mild AR and MR, mild dilated ascending aorta (4.2cm),  mild to moderate atherosclerosis  descending aorta,  mild to moderate TR,  negative saline microcavitation study  . THYROIDECTOMY Right 01/20/2015   Procedure: RIGHT THYROIDECTOMY;  Surgeon: Darletta Moll, MD;  [facility];  Service: ENT;  Laterality: Right;  . TONSILLECTOMY  as child  . TOTAL HIP ARTHROPLASTY Left 12/14/2020   Procedure: TOTAL HIP ARTHROPLASTY ANTERIOR APPROACH;  Surgeon: Sheral Apley, MD;  [facility];  Service: Orthopedics;  Laterality: Left;  . TRANSURETHRAL RESECTION OF BLADDER TUMOR N/A 05/15/2016   Procedure: TRANSURETHRAL RESECTION OF BLADDER TUMOR (TURBT) AND INSTILLATION OF EPIRUBICIN;  Surgeon: Marcine Matar, MD;  Location: [facility];  Service: Urology;  Laterality: N/A;  . TRANSURETHRAL RESECTION OF BLADDER TUMOR N/A 03/29/2020   Procedure: TRANSURETHRAL RESECTION OF BLADDER TUMOR (TURBT);  Surgeon: Marcine Matar, MD;  [facility];  Service: Urology;  Laterality: N/A;    There were no vitals filed for this visit.   Subjective Assessment - 12/23/20 1459    Subjective Pt arrived with wife, reports he slid off the loveseat and had trouble getting back up.  Reports he continues to have swelling Lt LE, arrived wearing compression garment.    Patient is accompained by: Family member   wife   Currently in Pain? Yes    Pain Score 4     Pain Location Hip    Pain Orientation Left    Pain Descriptors / Indicators Aching;Sore    Pain Type Surgical pain    Pain Onset In the past 7 days    Pain Frequency Intermittent              [facility] PT Assessment - 12/23/20 0001      Assessment   Medical Diagnosis L THR anterior approach    Referring Provider (PT) Dr. Margarita Rana    Onset Date/Surgical Date 12/14/20    Next MD Visit 12/29/20                         [facility] Adult PT Treatment/Exercise - 12/23/20 0001      Ambulation/Gait   Ambulation/Gait Yes    Ambulation/Gait Assistance 5: Supervision    Ambulation Distance (Feet) 230 Feet   2 sets   Assistive device Rolling walker    Gait Comments Cueing for heel to toe mechanics and equal stride length      Exercises   Exercises Knee/Hip      Knee/Hip Exercises: Seated   Long Arc Quad 10 reps    Long Arc Quad Limitations 5 second  holds    Marching AROM;Right;AAROM;Left;10 reps    Marching Limitations AAROM end range Lt LE      Knee/Hip Exercises: Supine   Hip Adduction Isometric AROM;20 reps    Bridges Both;2 sets;10 reps    Bridges Limitations 5 second holds    Other Supine Knee/Hip Exercises hip abd isometric with belt x20 5" holds    Other Supine Knee/Hip Exercises march AAROM up then controlled down 3"                    PT Short Term Goals - 12/16/20 1042      PT SHORT TERM GOAL #1   Title Pt will be independent with HEP and perform consistently in order to promote return to PLOF.    Baseline in development    Time 3    Period Weeks    Status New    Target Date 01/06/21      PT SHORT TERM GOAL #2   Title Patient will demonstrate bed mobility with Supervision to  decrease level of assistance from caregivers    Baseline mod A    Time 3    Period Weeks    Status New    Target Date 01/06/21      PT SHORT TERM GOAL #3   Title Patient will demonstrate transfers with supervision to decrease level of care    Baseline Min A for sit to stand    Time 3    Period Weeks    Status New    Target Date 01/06/21      PT SHORT TERM GOAL #4   Title Patient will demonstrate improved ambulation tolerance as evidenced by distacne of 200 ft during 2MWT using least restrictive AD    Baseline 75 ft w/ RW and antalgic pattern with step-to pattern    Time 3    Period Weeks     Status New    Target Date 01/06/21             PT Long Term Goals - 12/16/20 1045      PT LONG TERM GOAL #1   Title Patient will demonstrate 4/5 RLE to improve gait mechanics    Baseline 3-/5 left knee    Time 6    Period Weeks    Status New    Target Date 01/27/21      PT LONG TERM GOAL #2   Title Patient will improve on FOTO score to meet predicted outcomes to improve functional independence    Baseline 18% function    Time 6    Period Weeks    Status New    Target Date 01/27/21                 Plan - 12/23/20 1547    Clinical Impression Statement Gait training wiht multimodal cueing to improve heel strike and equalize stride length to normalize gait mechanics.  Session focus on Lt LE strengthening, pt demonstrated extreme weakness especially quad and gluteal mm.  Therapist assisted with AAROM for hip flexion wiht knees flexed, pt able to demonstrate good control lowering that improved with reps.  No reports of increased pain.  Did demonstrate improved gait mechanics at EOS.    Personal Factors and Comorbidities Age;Comorbidity 3+;Fitness    Comorbidities see PMH    Examination-Activity Limitations Bathing;Bed Mobility;Bend;Carry;Dressing;Lift;Toileting;Stand;Stairs;Squat;Locomotion Level;Transfers    Examination-Participation Restrictions Cleaning;Community Activity;Driving;Laundry;Yard Work;Shop;Occupation    Stability/Clinical Decision Making Stable/Uncomplicated    Clinical Decision Making Low  Rehab Potential Excellent    PT Frequency 3x / week    PT Duration 6 weeks    PT Treatment/Interventions ADLs/Self Care Home Management;Aquatic Therapy;Biofeedback;Cryotherapy;Electrical Stimulation;DME Instruction;Ultrasound;Moist Heat;Gait training;Stair training;Functional mobility training;Therapeutic activities;Therapeutic exercise;Balance training;Patient/family education;Neuromuscular re-education;Manual techniques;Passive range of motion;Taping;Energy  conservation;Dry needling;Spinal Manipulations;Joint Manipulations    PT Next Visit Plan Continue with LE AROM, strengthening, and gait training    PT Home Exercise Plan quad sets, heel slides, ambulation with RW; 12/27 heel slides, hip abd/add/extension isometrics           Patient will benefit from skilled therapeutic intervention in order to improve the following deficits and impairments:  Abnormal gait,Decreased activity tolerance,Decreased balance,Decreased mobility,Decreased knowledge of use of DME,Decreased knowledge of precautions,Decreased endurance,Decreased coordination,Decreased range of motion,Decreased safety awareness,Decreased strength,Difficulty walking,Impaired perceived functional ability,Improper body mechanics,Pain  Visit Diagnosis: Muscle weakness (generalized)  Pain in left leg  Difficulty in walking, not elsewhere classified     Problem List Patient Active Problem List   Diagnosis Date Noted  . 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  . Encounter for pre-operative respiratory clearance 11/16/2020  . 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 COPD with chronic bronchitis and emphysema ([place]) 09/22/2020  . Penile pain 04/07/2020  . Balanitis 04/07/2020  . Cervical myelopathy ([place]) 04/03/2017  . Thoracic ascending aortic aneurysm ([place]) 05/09/2016  . Thoracic aortic aneurysm without rupture ([place]) 01/17/2016  . Paroxysmal atrial fibrillation ([place]) 12/17/2015  . S/P partial thyroidectomy 01/20/2015   Ihor Austin, LPTA/CLT; CBIS [phone]  Aldona Lento 12/23/2020, 4:01 PM  [facility] [address] [place], [place], [address] Phone: [phone]   Fax:  [phone]  Name: KASAI BROWDER MRN: [id number] Date of Birth: 11-25-1941

## 2020-12-27 ENCOUNTER — Ambulatory Visit (HOSPITAL_COMMUNITY): Payer: Medicare Other | Admitting: Physical Therapy

## 2020-12-27 ENCOUNTER — Telehealth (HOSPITAL_COMMUNITY): Payer: Self-pay | Admitting: Physical Therapy

## 2020-12-27 NOTE — Telephone Encounter (Signed)
cx due to weather

## 2020-12-29 ENCOUNTER — Encounter (HOSPITAL_COMMUNITY): Payer: Self-pay | Admitting: Physical Therapy

## 2020-12-29 ENCOUNTER — Other Ambulatory Visit: Payer: Self-pay

## 2020-12-29 ENCOUNTER — Ambulatory Visit (HOSPITAL_COMMUNITY): Payer: Medicare Other | Attending: Orthopedic Surgery | Admitting: Physical Therapy

## 2020-12-29 DIAGNOSIS — M6281 Muscle weakness (generalized): Secondary | ICD-10-CM | POA: Diagnosis present

## 2020-12-29 DIAGNOSIS — R262 Difficulty in walking, not elsewhere classified: Secondary | ICD-10-CM | POA: Insufficient documentation

## 2020-12-29 DIAGNOSIS — M79605 Pain in left leg: Secondary | ICD-10-CM | POA: Diagnosis present

## 2020-12-29 NOTE — Therapy (Signed)
[place], [place], [address] Phone: [phone]   Fax:  [phone]  Physical Therapy Treatment  Patient Details  Name: Kyle Owen MRN: [id number] Date of Birth: 10-10-1941 Referring Provider (PT): Dr. [name]   Encounter Date: 12/29/2020   PT End of Session - 12/29/20 0959    Visit Number 5    Number of Visits 18    Date for PT Re-Evaluation 01/27/21    Authorization Type Medicare, 53 combined visits PT/OT/ST    Authorization - Visit Number 5    Authorization - Number of Visits 60    Progress Note Due on Visit 10    PT Start Time 1002    PT Stop Time 1040    PT Time Calculation (min) 38 min    Activity Tolerance Patient tolerated treatment well    Behavior During Therapy [facility] for tasks assessed/performed           Past Medical History:  Diagnosis Date  . Arthritis    DJD right knee  . Benign prostatic hypertrophy with urinary frequency   . Bladder cancer ([place])   . Complication of anesthesia    gets combative in recovery  . COPD with emphysema ([place])   . Depression   . Dyspnea    when walking  . Dysrhythmia    a fib  . History of colon polyps   . History of kidney stones   . History of loop recorder    loop recorder in place battery is dead has not had changed due to covid  . History of pneumonia    hx Recurrent -- last bout Dec 2016 CAP-- per pt resolved  . 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 Hyperlipidemia   . Hypertension   . 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 Nephrolithiasis    right non-obstructive  per CT 05-02-2016  . Ocular migraine    controlled w/ verapamil  . PAF (paroxysmal  atrial fibrillation) ([place])    followed by AFIB clinic-- cardiologist-- dr Marlou Porch  . Pneumonia    history of pnu.  . Pulmonary nodule, left    left lower lobe x2 per CT 01-20-2016  . Renal cyst, left   . Stroke ([place])    TIA's  . Thoracic aortic aneurysm without rupture ([place])    ascending -- ct chest 01/10/16  4.8cm  . Wears hearing aid    bilateral-- wear at times    Past Surgical History:  Procedure Laterality Date  . ANTERIOR CERVICAL DECOMP/DISCECTOMY FUSION N/A 04/03/2017   Procedure: Cervical three-four, Cervical four-five Anterior cervical decompression/discectomy/fusion;  Surgeon: Erline Levine, MD;  Location: [place];  Service: Neurosurgery;  Laterality: N/A;  . CATARACT EXTRACTION W/ INTRAOCULAR LENS IMPLANT Right 2016  . CATARACT EXTRACTION W/PHACO  12/16/2012   Procedure: CATARACT EXTRACTION PHACO AND INTRAOCULAR LENS PLACEMENT (IOC);  Surgeon: Tonny Branch, MD;  [facility];  Service: Ophthalmology;  Laterality: Left;  CDE:17.31  . COLONOSCOPY  10/03/2011   Procedure: COLONOSCOPY;  Surgeon: Jamesetta So;  [facility];  Service: Gastroenterology;  Laterality: N/A;  . CYSTOSCOPY/RETROGRADE/URETEROSCOPY/STONE EXTRACTION WITH BASKET Right 03/29/2020   Procedure: CYSTOSCOPY/RETROGRADE/URETEROSCOPY/STONE EXTRACTION WITH BASKET;  Surgeon: Franchot Gallo, MD;  [facility];  Service: Urology;  Laterality: Right;  1 HR  . DECOMPRESSIOIN ULNAR NERVE AND CUBITAL TUNNEL RELEASE Left 07-14-2009   elbow  . 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 HOLMIUM LASER APPLICATION Right [id number]   Procedure: HOLMIUM LASER APPLICATION;  Surgeon: Franchot Gallo, MD;  [facility];  Service: Urology;  Laterality: Right;  . INGUINAL HERNIA REPAIR Right 1990's  . KNEE ARTHROSCOPY Right 2015  . LEFT CUBITAL TUNNEL RELEASE    . POSTERIOR LUMBAR FUSION  2014   X2  . SHOULDER ARTHROSCOPY Left 1990's  . TEE WITHOUT  CARDIOVERSION N/A 07/27/2015   Procedure: TRANSESOPHAGEAL ECHOCARDIOGRAM (TEE);  Surgeon: Lelon Perla, MD;  Location: [facility];  Service: Cardiovascular;  Laterality: N/A;   normal LV function, ef 55-60%,  mild AR and MR, mild dilated ascending aorta (4.2cm),  mild to moderate atherosclerosis  descending aorta,  mild to moderate TR,  negative saline microcavitation study  . THYROIDECTOMY Right 01/20/2015   Procedure: RIGHT THYROIDECTOMY;  Surgeon: Ascencion Dike, MD;  Location: [place];  Service: ENT;  Laterality: Right;  . TONSILLECTOMY  as child  . TOTAL HIP ARTHROPLASTY Left 12/14/2020   Procedure: TOTAL HIP ARTHROPLASTY ANTERIOR APPROACH;  Surgeon: [name], MD;  [facility];  Service: Orthopedics;  Laterality: Left;  . TRANSURETHRAL RESECTION OF BLADDER TUMOR N/A 05/15/2016   Procedure: TRANSURETHRAL RESECTION OF BLADDER TUMOR (TURBT) AND INSTILLATION OF EPIRUBICIN;  Surgeon: Franchot Gallo, MD;  Location: [facility];  Service: Urology;  Laterality: N/A;  . TRANSURETHRAL RESECTION OF BLADDER TUMOR N/A 03/29/2020   Procedure: TRANSURETHRAL RESECTION OF BLADDER TUMOR (TURBT);  Surgeon: Franchot Gallo, MD;  [facility];  Service: Urology;  Laterality: N/A;    There were no vitals filed for this visit.   Subjective Assessment - 12/29/20 1031    Subjective States that a couple of days ago, over the weekend his leg really started to hurt. He has been elevating his leg but it hurts in his foot after a little bit of time but the front of the leg is constantly painful. States his foot is so swollen he can't touch it it's very sensitive. States he is on Eliquis and Tylenol. Current pain is 4/10 in the front of the hip with walking. States he feels like he is not moving around as well. States that he is wearing compression garments but that he had to remove it yesterday on the left foot because it was so painful and swollen.    Patient is accompained by: Family  member   wife   Currently in Pain? Yes    Pain Score 4     Pain Location Hip    Pain Orientation Left    Pain Descriptors / Indicators Aching;Throbbing    Pain Type Surgical pain    Pain Onset In the past 7 days              [facility] PT Assessment - 12/29/20 0001      Assessment   Medical Diagnosis L THR anterior approach    Referring Provider (PT) Dr. [name]    Onset Date/Surgical Date 12/14/20    Next MD Visit 12/29/20      Observation/Other Assessments   Observations no warmth or redness, no redness near bandage. No signs of infection      Observation/Other Assessments-Edema    Edema --   swelling throughout  left lower leg and hip - mostly at hip and lower leg/foot     Special Tests   Other special tests negative homan's  sign on left                         [facility] Adult PT Treatment/Exercise - 12/29/20 0001      Knee/Hip Exercises: Supine   Other Supine Knee/Hip Exercises hip abd/ add isometrics with belt x15 5" holds each    Other Supine Knee/Hip Exercises glute squeezes x15 5" holds                  PT Education - 12/29/20 1028    Education Details on signs and symptoms of infection, of DVT, of arterial insuffiiciency symptoms, on intermittent elevation secondary to swelling and likely arterial insufficiency. answered all questions and concerns    Person(s) Educated Patient    Methods Explanation    Comprehension Verbalized understanding            PT Short Term Goals - 12/16/20 1042      PT SHORT TERM GOAL #1   Title Pt will be independent with HEP and perform consistently in order to promote return to PLOF.    Baseline in development    Time 3    Period Weeks    Status New    Target Date 01/06/21      PT SHORT TERM GOAL #2   Title Patient will demonstrate bed mobility with Supervision to  decrease level of assistance from caregivers    Baseline mod A    Time 3    Period Weeks    Status New    Target Date 01/06/21       PT SHORT TERM GOAL #3   Title Patient will demonstrate transfers with supervision to decrease level of care    Baseline Min A for sit to stand    Time 3    Period Weeks    Status New    Target Date 01/06/21      PT SHORT TERM GOAL #4   Title Patient will demonstrate improved ambulation tolerance as evidenced by distacne of 200 ft during 2MWT using least restrictive AD    Baseline 75 ft w/ RW and antalgic pattern with step-to pattern    Time 3    Period Weeks    Status New    Target Date 01/06/21             PT Long Term Goals - 12/16/20 1045      PT LONG TERM GOAL #1   Title Patient will demonstrate 4/5 RLE to improve gait mechanics    Baseline 3-/5 left knee    Time 6    Period Weeks    Status New    Target Date 01/27/21      PT LONG TERM GOAL #2   Title Patient will improve on FOTO score to meet predicted outcomes to improve functional independence    Baseline 18% function    Time 6    Period Weeks    Status New    Target Date 01/27/21                 Plan - 12/29/20 0959    Clinical Impression Statement Focused on education today with increased swelling and concerns. No signs and symptoms of infection or DVT but swelling noted in lower leg. Likely patient has arterial insufficiency secondary to reported pain  in legs with elevation for long periods of time. Educated patient on intermittent elevation, ankle pumps and isometrics. Patient to follow up with MD today, will follow up with patient post MD appointment in regards to recent increase in swelling and pain.    Personal Factors and Comorbidities Age;Comorbidity 3+;Fitness    Comorbidities see PMH    Examination-Activity Limitations Bathing;Bed Mobility;Bend;Carry;Dressing;Lift;Toileting;Stand;Stairs;Squat;Locomotion Level;Transfers    Examination-Participation Restrictions Cleaning;Community Activity;Driving;Laundry;Yard Work;Shop;Occupation    Stability/Clinical Decision Making Stable/Uncomplicated     Rehab Potential Excellent    PT Frequency 3x / week    PT Duration 6 weeks    PT Treatment/Interventions ADLs/Self Care Home Management;Aquatic Therapy;Biofeedback;Cryotherapy;Electrical Stimulation;DME Instruction;Ultrasound;Moist Heat;Gait training;Stair training;Functional mobility training;Therapeutic activities;Therapeutic exercise;Balance training;Patient/family education;Neuromuscular re-education;Manual techniques;Passive range of motion;Taping;Energy conservation;Dry needling;Spinal Manipulations;Joint Manipulations    PT Next Visit Plan Continue with LE AROM, strengthening, and gait training; f/u with patient about MD apt.    PT Home Exercise Plan quad sets, heel slides, ambulation with RW; 12/27 heel slides, hip abd/add/extension isometrics           Patient will benefit from skilled therapeutic intervention in order to improve the following deficits and impairments:  Abnormal gait,Decreased activity tolerance,Decreased balance,Decreased mobility,Decreased knowledge of use of DME,Decreased knowledge of precautions,Decreased endurance,Decreased coordination,Decreased range of motion,Decreased safety awareness,Decreased strength,Difficulty walking,Impaired perceived functional ability,Improper body mechanics,Pain  Visit Diagnosis: Muscle weakness (generalized)  Pain in left leg  Difficulty in walking, not elsewhere classified     Problem List Patient Active Problem List   Diagnosis Date Noted  . 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  . Encounter for pre-operative respiratory clearance 11/16/2020  . 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  . COPD with chronic bronchitis and emphysema ([facility]) 09/22/2020  . Penile pain 04/07/2020  . Balanitis 04/07/2020  . Cervical myelopathy ([facility]) 04/03/2017  . Thoracic ascending aortic aneurysm ([facility]) 05/09/2016  . Thoracic aortic aneurysm without rupture ([facility]) 01/17/2016  . Paroxysmal atrial fibrillation ([facility])  12/17/2015  . S/P partial thyroidectomy 01/20/2015   10:44 AM, 12/29/20 Tereasa Coop, DPT Physical Therapy with [facility]  [phone] office  [facility] [address] [place], [place], [address] Phone: [phone]   Fax:  [phone]  Name: Kyle Owen MRN: [id number] Date of Birth: 15-Mar-1941

## 2020-12-31 ENCOUNTER — Encounter (HOSPITAL_COMMUNITY): Payer: Self-pay

## 2020-12-31 ENCOUNTER — Ambulatory Visit (HOSPITAL_COMMUNITY): Payer: Medicare Other

## 2020-12-31 ENCOUNTER — Other Ambulatory Visit: Payer: Self-pay

## 2020-12-31 DIAGNOSIS — M6281 Muscle weakness (generalized): Secondary | ICD-10-CM

## 2020-12-31 DIAGNOSIS — R262 Difficulty in walking, not elsewhere classified: Secondary | ICD-10-CM

## 2020-12-31 DIAGNOSIS — M79605 Pain in left leg: Secondary | ICD-10-CM

## 2020-12-31 NOTE — Therapy (Signed)
[facility] [address] [place], [place], [address] Phone: [phone]   Fax:  [phone]  Physical Therapy Treatment  Patient Details  Name: Kyle BalRoger A Grenda MRN: [id number] Date of Birth: 09/28/1941 Referring Provider (PT): Dr. Margarita Ranaimothy Murphy   Encounter Date: 12/31/2020   PT End of Session - 12/31/20 1337    Visit Number 6    Number of Visits 18    Date for PT Re-Evaluation 01/27/21    Authorization Type Medicare, 60 combined visits PT/OT/ST    Authorization - Visit Number 6    Authorization - Number of Visits 60    Progress Note Due on Visit 10    PT Start Time 1318    PT Stop Time 1400    PT Time Calculation (min) 42 min    Activity Tolerance Patient tolerated treatment well    Behavior [facility] for tasks assessed/performed           Past Medical History:  Diagnosis Date  . Arthritis    DJD right knee  . Benign prostatic hypertrophy with urinary frequency   . Bladder cancer (HCC)   . Complication of anesthesia    gets combative in recovery  . COPD with emphysema (HCC)   . Depression   . Dyspnea    when walking  . Dysrhythmia    a fib  . History of colon polyps   . History of kidney stones   . History of loop recorder    loop recorder in place battery is dead has not had changed due to covid  . History of pneumonia    hx Recurrent -- last bout Dec 2016 CAP-- per pt resolved  . History of TIA (transient ischemic attack) no residual per pt   per neurologist note (dr Marjory Liespenumalli 11-08-2015) dx cryptogenic TIA versus arrhythia versus dysautonomia (right ophthalmic artery TIA and x3 posterior circulation TIAs  . Hyperlipidemia   . Hypertension   . 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 Nephrolithiasis    right non-obstructive  per CT 05-02-2016  . Ocular migraine    controlled w/ verapamil  . PAF (paroxysmal  atrial fibrillation) (HCC)    followed by AFIB clinic-- cardiologist-- dr Anne Fuskains  . Pneumonia    history of pnu.  . Pulmonary nodule, left    left lower lobe x2 per CT 01-20-2016  . Renal cyst, left   . Stroke (HCC)    TIA's  . 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 Wears hearing aid    bilateral-- wear at times    Past Surgical History:  Procedure Laterality Date  . ANTERIOR CERVICAL DECOMP/DISCECTOMY FUSION N/A 04/03/2017   Procedure: Cervical three-four, Cervical four-five Anterior cervical decompression/discectomy/fusion;  Surgeon: Maeola HarmanJoseph Stern, MD;  [facility];  Service: Neurosurgery;  Laterality: N/A;  . CATARACT EXTRACTION W/ INTRAOCULAR LENS IMPLANT Right 2016  . CATARACT EXTRACTION W/PHACO  12/16/2012   Procedure: CATARACT EXTRACTION PHACO AND INTRAOCULAR LENS PLACEMENT (IOC);  Surgeon: Gemma PayorKerry Hunt, MD;  Location: [facility];  Service: Ophthalmology;  Laterality: Left;  CDE:17.31  . COLONOSCOPY  10/03/2011   Procedure: COLONOSCOPY;  Surgeon: Dalia HeadingMark A Jenkins;  [facility];  Service: Gastroenterology;  Laterality: N/A;  . CYSTOSCOPY/RETROGRADE/URETEROSCOPY/STONE EXTRACTION WITH BASKET Right 03/29/2020   Procedure: CYSTOSCOPY/RETROGRADE/URETEROSCOPY/STONE EXTRACTION WITH BASKET;  Surgeon: Marcine Matarahlstedt, Stephen, MD;  [facility];  Service: Urology;  Laterality: Right;  1 HR  . DECOMPRESSIOIN ULNAR NERVE AND CUBITAL TUNNEL RELEASE Left 07-14-2009   elbow  . 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 HOLMIUM LASER APPLICATION Right 0/06/3709   Procedure: HOLMIUM LASER APPLICATION;  Surgeon: Franchot Gallo, MD;  [facility];  Service: Urology;  Laterality: Right;  . INGUINAL HERNIA REPAIR Right 1990's  . KNEE ARTHROSCOPY Right 2015  . LEFT CUBITAL TUNNEL RELEASE    . POSTERIOR LUMBAR FUSION  2014   X2  . SHOULDER ARTHROSCOPY Left 1990's  . TEE WITHOUT  CARDIOVERSION N/A 07/27/2015   Procedure: TRANSESOPHAGEAL ECHOCARDIOGRAM (TEE);  Surgeon: Lelon Perla, MD;  Location: [facility] ENDOSCOPY;  Service: Cardiovascular;  Laterality: N/A;   normal LV function, ef 55-60%,  mild AR and MR, mild dilated ascending aorta (4.2cm),  mild to moderate atherosclerosis  descending aorta,  mild to moderate TR,  negative saline microcavitation study  . THYROIDECTOMY Right 01/20/2015   Procedure: RIGHT THYROIDECTOMY;  Surgeon: Ascencion Dike, MD;  Location: [place];  Service: ENT;  Laterality: Right;  . TONSILLECTOMY  as child  . TOTAL HIP ARTHROPLASTY Left 12/14/2020   Procedure: TOTAL HIP ARTHROPLASTY ANTERIOR APPROACH;  Surgeon: [name], MD;  [facility];  Service: Orthopedics;  Laterality: Left;  . TRANSURETHRAL RESECTION OF BLADDER TUMOR N/A 05/15/2016   Procedure: TRANSURETHRAL RESECTION OF BLADDER TUMOR (TURBT) AND INSTILLATION OF EPIRUBICIN;  Surgeon: Franchot Gallo, MD;  [facility];  Service: Urology;  Laterality: N/A;  . TRANSURETHRAL RESECTION OF BLADDER TUMOR N/A 03/29/2020   Procedure: TRANSURETHRAL RESECTION OF BLADDER TUMOR (TURBT);  Surgeon: Franchot Gallo, MD;  [facility];  Service: Urology;  Laterality: N/A;    There were no vitals filed for this visit.   Subjective Assessment - 12/31/20 1320    Subjective Went to MD and prescribed prednizone and reports pain reduced. Xray was taken for back and leg and looked good.  Reports pain to 3/10/popping with hip flexion    Patient is accompained by: Family member    Currently in Pain? No/denies   pain/popping with hip flexion                            [facility] Adult PT Treatment/Exercise - 12/31/20 0001      Exercises   Exercises Knee/Hip      Knee/Hip Exercises: Seated   Long Arc Quad 10 reps    Long Arc Quad Limitations 5 second holds    Sit to [employer] 5 reps;without UE support   cueing for equal weight bearing, elevated height to 23in with  ability to complete wihtout HHA, cueing for mechanics     Knee/Hip Exercises: Supine   Short Arc [employer] 10 reps    Short Arc [employer] Limitations 5" holds    Bridges Both;2 sets;10 reps    Bridges Limitations 5 second holds    Other Supine Knee/Hip Exercises Decompression 4-5 for isometric posterior chain    Other Supine Knee/Hip Exercises hip abd wiht belt 10x 5"; marching 10x3" AAROM to AROM                  PT Education - 12/31/20 1344    Education Details Educated benefits with butler for increase ease donning compression sock    Person(s) Educated Patient;Spouse    Methods Explanation;Demonstration  Comprehension Verbalized understanding            PT Short Term Goals - 12/16/20 1042      PT SHORT TERM GOAL #1   Title Pt will be independent with HEP and perform consistently in order to promote return to PLOF.    Baseline in development    Time 3    Period Weeks    Status New    Target Date 01/06/21      PT SHORT TERM GOAL #2   Title Patient will demonstrate bed mobility with Supervision to  decrease level of assistance from caregivers    Baseline mod A    Time 3    Period Weeks    Status New    Target Date 01/06/21      PT SHORT TERM GOAL #3   Title Patient will demonstrate transfers with supervision to decrease level of care    Baseline Min A for sit to stand    Time 3    Period Weeks    Status New    Target Date 01/06/21      PT SHORT TERM GOAL #4   Title Patient will demonstrate improved ambulation tolerance as evidenced by distacne of 200 ft during 2MWT using least restrictive AD    Baseline 75 ft w/ RW and antalgic pattern with step-to pattern    Time 3    Period Weeks    Status New    Target Date 01/06/21             PT Long Term Goals - 12/16/20 1045      PT LONG TERM GOAL #1   Title Patient will demonstrate 4/5 RLE to improve gait mechanics    Baseline 3-/5 left knee    Time 6    Period Weeks    Status New    Target Date  01/27/21      PT LONG TERM GOAL #2   Title Patient will improve on FOTO score to meet predicted outcomes to improve functional independence    Baseline 18% function    Time 6    Period Weeks    Status New    Target Date 01/27/21                 Plan - 12/31/20 1348    Clinical Impression Statement Pt and spouse educated on butler to assist with donning compression sock.  Session focus on LE strengthening.  Added decompression exercises for isometric posterior chain and continued with supine/seated/STS exercises.  Pt demonstrated improved strengthening noted with increased raise during bridges and ability to complete supine marching indepdently, pt anticipates pain during hip flexion so began with AAROM than able to complete AROM with minimal pain and no reoprts of popping.    Personal Factors and Comorbidities Age;Comorbidity 3+;Fitness    Comorbidities see PMH    Examination-Activity Limitations Bathing;Bed Mobility;Bend;Carry;Dressing;Lift;Toileting;Stand;Stairs;Squat;Locomotion Level;Transfers    Examination-Participation Restrictions Cleaning;Community Activity;Driving;Laundry;Yard Work;Shop;Occupation    Stability/Clinical Decision Making Stable/Uncomplicated    Clinical Decision Making Low    Rehab Potential Excellent    PT Frequency 3x / week    PT Duration 6 weeks    PT Treatment/Interventions ADLs/Self Care Home Management;Aquatic Therapy;Biofeedback;Cryotherapy;Electrical Stimulation;DME Instruction;Ultrasound;Moist Heat;Gait training;Stair training;Functional mobility training;Therapeutic activities;Therapeutic exercise;Balance training;Patient/family education;Neuromuscular re-education;Manual techniques;Passive range of motion;Taping;Energy conservation;Dry needling;Spinal Manipulations;Joint Manipulations    PT Next Visit Plan Continue with LE AROM, strengthening, and gait training.  Answer any questions regarding butler or donning compression garment for ease.  PT  Home Exercise Plan quad sets, heel slides, ambulation with RW; 12/27 heel slides, hip abd/add/extension isometrics; 12/31/20: bridge and supine hip flexion.           Patient will benefit from skilled therapeutic intervention in order to improve the following deficits and impairments:  Abnormal gait,Decreased activity tolerance,Decreased balance,Decreased mobility,Decreased knowledge of use of DME,Decreased knowledge of precautions,Decreased endurance,Decreased coordination,Decreased range of motion,Decreased safety awareness,Decreased strength,Difficulty walking,Impaired perceived functional ability,Improper body mechanics,Pain  Visit Diagnosis: Muscle weakness (generalized)  Pain in left leg  Difficulty in walking, not elsewhere classified     Problem List Patient Active Problem List   Diagnosis Date Noted  . 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  . Encounter for pre-operative respiratory clearance 11/16/2020  . 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 COPD with chronic bronchitis and emphysema ([place]) 09/22/2020  . Penile pain 04/07/2020  . Balanitis 04/07/2020  . Cervical myelopathy ([place]) 04/03/2017  . Thoracic ascending aortic aneurysm ([place]) 05/09/2016  . Thoracic aortic aneurysm without rupture ([place]) 01/17/2016  . Paroxysmal atrial fibrillation ([place]) 12/17/2015  . S/P partial thyroidectomy 01/20/2015   Ihor Austin, LPTA/CLT; CBIS [phone]  Aldona Lento 12/31/2020, 3:50 PM  [place] [address] [place], [place], [address] Phone: [phone]   Fax:  [phone]  Name: WILLIARD KELLER MRN: [id number] Date of Birth: 1941-10-11

## 2021-01-03 ENCOUNTER — Ambulatory Visit (HOSPITAL_COMMUNITY): Payer: Medicare Other | Admitting: Physical Therapy

## 2021-01-03 ENCOUNTER — Other Ambulatory Visit: Payer: Self-pay

## 2021-01-03 ENCOUNTER — Encounter (HOSPITAL_COMMUNITY): Payer: Self-pay | Admitting: Physical Therapy

## 2021-01-03 DIAGNOSIS — M79605 Pain in left leg: Secondary | ICD-10-CM

## 2021-01-03 DIAGNOSIS — M6281 Muscle weakness (generalized): Secondary | ICD-10-CM

## 2021-01-03 DIAGNOSIS — R262 Difficulty in walking, not elsewhere classified: Secondary | ICD-10-CM

## 2021-01-03 NOTE — Therapy (Signed)
[place], [place], [address] Phone: [phone]   Fax:  [phone]  Physical Therapy Treatment  Patient Details  Name: Kyle Owen MRN: [id number] Date of Birth: 1941-01-17 Referring Provider (PT): Dr. [name]   Encounter Date: 01/03/2021   PT End of Session - 01/03/21 1003    Visit Number 7    Number of Visits 18    Date for PT Re-Evaluation 01/27/21    Authorization Type Medicare, 79 combined visits PT/OT/ST    Authorization - Visit Number 7    Authorization - Number of Visits 60    Progress Note Due on Visit 10    PT Start Time 1003    PT Stop Time 1042    PT Time Calculation (min) 39 min    Activity Tolerance Patient tolerated treatment well;No increased pain    Behavior During Therapy WFL for tasks assessed/performed           Past Medical History:  Diagnosis Date  . Arthritis    DJD right knee  . Benign prostatic hypertrophy with urinary frequency   . Bladder cancer ([place])   . Complication of anesthesia    gets combative in recovery  . COPD with emphysema ([place])   . Depression   . Dyspnea    when walking  . Dysrhythmia    a fib  . History of colon polyps   . History of kidney stones   . History of loop recorder    loop recorder in place battery is dead has not had changed due to covid  . History of pneumonia    hx Recurrent -- last bout Dec 2016 CAP-- per pt resolved  . 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 Hyperlipidemia   . Hypertension   . 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 Nephrolithiasis    right non-obstructive  per CT 05-02-2016  . Ocular migraine    controlled w/ verapamil  .  PAF (paroxysmal atrial fibrillation) ([place])    followed by AFIB clinic-- cardiologist-- dr Marlou Porch  . Pneumonia    history of pnu.  . Pulmonary nodule, left    left lower lobe x2 per CT 01-20-2016  . Renal cyst, left   . Stroke ([place])    TIA's  . Thoracic aortic aneurysm without rupture ([place])    ascending -- ct chest 01/10/16  4.8cm  . Wears hearing aid    bilateral-- wear at times    Past Surgical History:  Procedure Laterality Date  . ANTERIOR CERVICAL DECOMP/DISCECTOMY FUSION N/A 04/03/2017   Procedure: Cervical three-four, Cervical four-five Anterior cervical decompression/discectomy/fusion;  Surgeon: Erline Levine, MD;  Location: [place];  Service: Neurosurgery;  Laterality: N/A;  . CATARACT EXTRACTION W/ INTRAOCULAR LENS IMPLANT Right 2016  . CATARACT EXTRACTION W/PHACO  12/16/2012   Procedure: CATARACT EXTRACTION PHACO AND INTRAOCULAR LENS PLACEMENT (IOC);  Surgeon: Tonny Branch, MD;  Location: [facility];  Service: Ophthalmology;  Laterality: Left;  CDE:17.31  . COLONOSCOPY  10/03/2011   Procedure: COLONOSCOPY;  Surgeon: Jamesetta So;  [facility];  Service: Gastroenterology;  Laterality: N/A;  . CYSTOSCOPY/RETROGRADE/URETEROSCOPY/STONE EXTRACTION WITH BASKET Right 03/29/2020   Procedure: CYSTOSCOPY/RETROGRADE/URETEROSCOPY/STONE EXTRACTION WITH BASKET;  Surgeon: Franchot Gallo,  MD;  [facility];  Service: Urology;  Laterality: Right;  1 HR  . DECOMPRESSIOIN ULNAR NERVE AND CUBITAL TUNNEL RELEASE Left 07-14-2009   elbow  . 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 HOLMIUM LASER APPLICATION Right 12/30/567   Procedure: HOLMIUM LASER APPLICATION;  Surgeon: Franchot Gallo, MD;  [facility];  Service: Urology;  Laterality: Right;  . INGUINAL HERNIA REPAIR Right 1990's  . KNEE ARTHROSCOPY Right 2015  . LEFT CUBITAL TUNNEL RELEASE    . POSTERIOR LUMBAR FUSION  2014   X2  . SHOULDER ARTHROSCOPY Left 1990's  . TEE  WITHOUT CARDIOVERSION N/A 07/27/2015   Procedure: TRANSESOPHAGEAL ECHOCARDIOGRAM (TEE);  Surgeon: Lelon Perla, MD;  Location: [facility] END[facility]COPY;  Service: Cardiovascular;  Laterality: N/A;   normal LV function, ef 55-60%,  mild AR and MR, mild dilated ascending aorta (4.2cm),  mild to moderate atherosclerosis  descending aorta,  mild to moderate TR,  negative saline microcavitation study  . THYROIDECTOMY Right 01/20/2015   Procedure: RIGHT THYROIDECTOMY;  Surgeon: Ascencion Dike, MD;  Location: [place];  Service: ENT;  Laterality: Right;  . TONSILLECTOMY  as child  . TOTAL HIP ARTHROPLASTY Left 12/14/2020   Procedure: TOTAL HIP ARTHROPLASTY ANTERIOR APPROACH;  Surgeon: [name], MD;  [facility];  Service: Orthopedics;  Laterality: Left;  . TRANSURETHRAL RESECTION OF BLADDER TUMOR N/A 05/15/2016   Procedure: TRANSURETHRAL RESECTION OF BLADDER TUMOR (TURBT) AND INSTILLATION OF EPIRUBICIN;  Surgeon: Franchot Gallo, MD;  Location: [facility];  Service: Urology;  Laterality: N/A;  . TRANSURETHRAL RESECTION OF BLADDER TUMOR N/A 03/29/2020   Procedure: TRANSURETHRAL RESECTION OF BLADDER TUMOR (TURBT);  Surgeon: Franchot Gallo, MD;  [facility];  Service: Urology;  Laterality: N/A;    There were no vitals filed for this visit.   Subjective Assessment - 01/03/21 1006    Subjective Reports prednizone working but initially didnt sleep and tomorrow is his last day. Reports HEP is going well and getting easy. 3/10 pain with no change by end of session. Reports HEP is going well and getting easy.    Patient is accompained by: Family member    Currently in Pain? Yes    Pain Score 3     Pain Location Hip    Pain Orientation Right    Pain Descriptors / Indicators Aching    Pain Type Surgical pain    Pain Onset In the past 7 days    Pain Frequency Intermittent                             [facility] Adult PT Treatment/Exercise - 01/03/21 0001       Ambulation/Gait   Ambulation/Gait Yes    Gait Comments improved hip flexion during ambulation   demoed marching approx 20 ft with RW     Exercises   Exercises Knee/Hip      Knee/Hip Exercises: Standing   Hip Abduction 5 reps;Both;3 sets   support at counter   Other Standing Knee Exercises lateral stepping at table 3x5   Verbal Cues for hip alignment, decreasing finger support.     Knee/Hip Exercises: Seated   Long Arc Quad 15 reps;Both   Verbal Cues for holding at top   [employer] Limitations 5 second holds    Marching Both;20 reps      Knee/Hip Exercises: Supine   Constance Haw  15 reps;Both    Bridges Limitations 5 second holds                  PT Education - 01/03/21 1041    Education Details Educated on new HEP, exercise mechanics, balance    Person(s) Educated Patient;Spouse    Methods Explanation;Demonstration    Comprehension Verbalized understanding;Returned demonstration            PT Short Term Goals - 12/16/20 1042      PT SHORT TERM GOAL #1   Title Pt will be independent with HEP and perform consistently in order to promote return to PLOF.    Baseline in development    Time 3    Period Weeks    Status New    Target Date 01/06/21      PT SHORT TERM GOAL #2   Title Patient will demonstrate bed mobility with Supervision to  decrease level of assistance from caregivers    Baseline mod A    Time 3    Period Weeks    Status New    Target Date 01/06/21      PT SHORT TERM GOAL #3   Title Patient will demonstrate transfers with supervision to decrease level of care    Baseline Min A for sit to stand    Time 3    Period Weeks    Status New    Target Date 01/06/21      PT SHORT TERM GOAL #4   Title Patient will demonstrate improved ambulation tolerance as evidenced by distacne of 200 ft during 2MWT using least restrictive AD    Baseline 75 ft w/ RW and antalgic pattern with step-to pattern    Time 3    Period Weeks    Status New    Target Date  01/06/21             PT Long Term Goals - 12/16/20 1045      PT LONG TERM GOAL #1   Title Patient will demonstrate 4/5 RLE to improve gait mechanics    Baseline 3-/5 left knee    Time 6    Period Weeks    Status New    Target Date 01/27/21      PT LONG TERM GOAL #2   Title Patient will improve on FOTO score to meet predicted outcomes to improve functional independence    Baseline 18% function    Time 6    Period Weeks    Status New    Target Date 01/27/21                 Plan - 01/03/21 1056    Clinical Impression Statement Demo good improvement within session with lateral ambulation using table as support and demo decreasing support with UEs for improvement in balance and weight bearing B. Demo increased UE support during standing on LLE while completing hip abduction on R, but reported no increase in pain. Cont demo increased difficulty with hip strength endurance seen during ambulation to/from treatment and lateral stepping where pt demoed increased dragging of feet. Educated on new HEP of lateral walking and standing hip abduction.    Personal Factors and Comorbidities Age;Comorbidity 3+;Fitness    Comorbidities see PMH    Examination-Activity Limitations Bathing;Bed Mobility;Bend;Carry;Dressing;Lift;Toileting;Stand;Stairs;Squat;Locomotion Level;Transfers    Examination-Participation Restrictions Cleaning;Community Activity;Driving;Laundry;Yard Work;Shop;Occupation    Stability/Clinical Decision Making Stable/Uncomplicated    Rehab Potential Excellent    PT Frequency 3x / week    PT Duration  6 weeks    PT Treatment/Interventions ADLs/Self Care Home Management;Aquatic Therapy;Biofeedback;Cryotherapy;Electrical Stimulation;DME Instruction;Ultrasound;Moist Heat;Gait training;Stair training;Functional mobility training;Therapeutic activities;Therapeutic exercise;Balance training;Patient/family education;Neuromuscular re-education;Manual techniques;Passive range of  motion;Taping;Energy conservation;Dry needling;Spinal Manipulations;Joint Manipulations    PT Next Visit Plan Continue with LE AROM, strengthening, and gait training.  Answer any questions regarding butler or donning compression garment for ease.    PT Home Exercise Plan quad sets, heel slides, ambulation with RW; 12/27 heel slides, hip abd/add/extension isometrics; 12/31/20: bridge and supine hip flexion. 1/10: standing hip abd, lateral walking with UE support.    Consulted and Agree with Plan of Care Patient;Family member/caregiver           Patient will benefit from skilled therapeutic intervention in order to improve the following deficits and impairments:  Abnormal gait,Decreased activity tolerance,Decreased balance,Decreased mobility,Decreased knowledge of use of DME,Decreased knowledge of precautions,Decreased endurance,Decreased coordination,Decreased range of motion,Decreased safety awareness,Decreased strength,Difficulty walking,Impaired perceived functional ability,Improper body mechanics,Pain  Visit Diagnosis: Muscle weakness (generalized)  Pain in left leg  Difficulty in walking, not elsewhere classified     Problem List Patient Active Problem List   Diagnosis Date Noted  . 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  . Encounter for pre-operative respiratory clearance 11/16/2020  . 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 COPD with chronic bronchitis and emphysema ([place]) 09/22/2020  . Penile pain 04/07/2020  . Balanitis 04/07/2020  . Cervical myelopathy ([place]) 04/03/2017  . Thoracic ascending aortic aneurysm ([place]) 05/09/2016  . Thoracic aortic aneurysm without rupture ([place]) 01/17/2016  . Paroxysmal atrial fibrillation ([place]) 12/17/2015  . S/P partial thyroidectomy 01/20/2015   12:56 PM, 01/03/21 Domenic Moras, DPT Physical Therapy with [facility]  [phone] office  12:56 PM, 01/03/21 Jerene Pitch, DPT Physical Therapy  with [facility]  [phone] office  [place] [address] [place], [place], [address] Phone: [phone]   Fax:  [phone]  Name: Kyle Owen MRN: [id number] Date of Birth: Apr 10, 1941

## 2021-01-05 ENCOUNTER — Encounter (HOSPITAL_COMMUNITY): Payer: Self-pay

## 2021-01-05 ENCOUNTER — Ambulatory Visit (HOSPITAL_COMMUNITY): Payer: Medicare Other

## 2021-01-05 ENCOUNTER — Encounter (HOSPITAL_COMMUNITY): Payer: Medicare Other | Admitting: Physical Therapy

## 2021-01-05 ENCOUNTER — Other Ambulatory Visit: Payer: Self-pay

## 2021-01-05 DIAGNOSIS — R262 Difficulty in walking, not elsewhere classified: Secondary | ICD-10-CM

## 2021-01-05 DIAGNOSIS — M6281 Muscle weakness (generalized): Secondary | ICD-10-CM | POA: Diagnosis not present

## 2021-01-05 DIAGNOSIS — M79605 Pain in left leg: Secondary | ICD-10-CM

## 2021-01-05 NOTE — Therapy (Signed)
[place], [place], [address] Phone: [phone]   Fax:  [phone]  Physical Therapy Treatment  Patient Details  Name: Kyle Owen MRN: [id number] Date of Birth: [birth year] Referring Provider (PT): Dr. [name]   Encounter Date: 01/05/2021   PT End of Session - 01/05/21 1033    Visit Number 8    Number of Visits 18    Date for PT Re-Evaluation 01/27/21    Authorization Type Medicare, 28 combined visits PT/OT/ST    Authorization - Visit Number 8    Authorization - Number of Visits 60    Progress Note Due on Visit 10    PT Start Time 1003    PT Stop Time 1048    PT Time Calculation (min) 45 min    Equipment Utilized During Treatment Gait belt    Activity Tolerance Patient limited by pain;No increased pain;Patient tolerated treatment well;Patient limited by fatigue    Behavior During Therapy [facility] for tasks assessed/performed           Past Medical History:  Diagnosis Date  . Arthritis    DJD right knee  . Benign prostatic hypertrophy with urinary frequency   . Bladder cancer ([place])   . Complication of anesthesia    gets combative in recovery  . COPD with emphysema ([place])   . Depression   . Dyspnea    when walking  . Dysrhythmia    a fib  . History of colon polyps   . History of kidney stones   . History of loop recorder    loop recorder in place battery is dead has not had changed due to covid  . History of pneumonia    hx Recurrent -- last bout Dec 2016 CAP-- per pt resolved  . 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 Hyperlipidemia   . Hypertension   . 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  Nephrolithiasis    right non-obstructive  per CT 05-02-2016  . Ocular migraine    controlled w/ verapamil  . PAF (paroxysmal atrial fibrillation) ([place])    followed by AFIB clinic-- cardiologist-- dr Marlou Porch  . Pneumonia    history of pnu.  . Pulmonary nodule, left    left lower lobe x2 per CT 01-20-2016  . Renal cyst, left   . Stroke ([place])    TIA's  . Thoracic aortic aneurysm without rupture ([place])    ascending -- ct chest 01/10/16  4.8cm  . Wears hearing aid    bilateral-- wear at times    Past Surgical History:  Procedure Laterality Date  . ANTERIOR CERVICAL DECOMP/DISCECTOMY FUSION N/A 04/03/2017   Procedure: Cervical three-four, Cervical four-five Anterior cervical decompression/discectomy/fusion;  Surgeon: Erline Levine, MD;  Location: [place];  Service: Neurosurgery;  Laterality: N/A;  . CATARACT EXTRACTION W/ INTRAOCULAR LENS IMPLANT Right 2016  . CATARACT EXTRACTION W/PHACO  12/16/2012   Procedure: CATARACT EXTRACTION PHACO AND INTRAOCULAR LENS PLACEMENT (IOC);  Surgeon: Tonny Branch, MD;  [facility];  Service: Ophthalmology;  Laterality: Left;  CDE:17.31  . COLONOSCOPY  10/03/2011   Procedure: COLONOSCOPY;  Surgeon: Jamesetta So;  [facility];  Service: Gastroenterology;  Laterality: N/A;  . CYSTOSCOPY/RETROGRADE/URETEROSCOPY/STONE EXTRACTION  WITH BASKET Right 03/29/2020   Procedure: CYSTOSCOPY/RETROGRADE/URETEROSCOPY/STONE EXTRACTION WITH BASKET;  Surgeon: Franchot Gallo, MD;  [facility];  Service: Urology;  Laterality: Right;  1 HR  . DECOMPRESSIOIN ULNAR NERVE AND CUBITAL TUNNEL RELEASE Left 07-14-2009   elbow  . 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 HOLMIUM LASER APPLICATION Right 08/27/8181   Procedure: HOLMIUM LASER APPLICATION;  Surgeon: Franchot Gallo, MD;  [facility];  Service: Urology;  Laterality: Right;  . INGUINAL HERNIA REPAIR Right 1990's  . KNEE ARTHROSCOPY Right 2015  .  LEFT CUBITAL TUNNEL RELEASE    . POSTERIOR LUMBAR FUSION  2014   X2  . SHOULDER ARTHROSCOPY Left 1990's  . TEE WITHOUT CARDIOVERSION N/A 07/27/2015   Procedure: TRANSESOPHAGEAL ECHOCARDIOGRAM (TEE);  Surgeon: Lelon Perla, MD;  Location: [facility] ENDOSCOPY;  Service: Cardiovascular;  Laterality: N/A;   normal LV function, ef 55-60%,  mild AR and MR, mild dilated ascending aorta (4.2cm),  mild to moderate atherosclerosis  descending aorta,  mild to moderate TR,  negative saline microcavitation study  . THYROIDECTOMY Right 01/20/2015   Procedure: RIGHT THYROIDECTOMY;  Surgeon: Ascencion Dike, MD;  Location: [place];  Service: ENT;  Laterality: Right;  . TONSILLECTOMY  as child  . TOTAL HIP ARTHROPLASTY Left 12/14/2020   Procedure: TOTAL HIP ARTHROPLASTY ANTERIOR APPROACH;  Surgeon: [name], MD;  [facility];  Service: Orthopedics;  Laterality: Left;  . TRANSURETHRAL RESECTION OF BLADDER TUMOR N/A 05/15/2016   Procedure: TRANSURETHRAL RESECTION OF BLADDER TUMOR (TURBT) AND INSTILLATION OF EPIRUBICIN;  Surgeon: Franchot Gallo, MD;  [facility];  Service: Urology;  Laterality: N/A;  . TRANSURETHRAL RESECTION OF BLADDER TUMOR N/A 03/29/2020   Procedure: TRANSURETHRAL RESECTION OF BLADDER TUMOR (TURBT);  Surgeon: Franchot Gallo, MD;  [facility];  Service: Urology;  Laterality: N/A;    There were no vitals filed for this visit.   Subjective Assessment - 01/05/21 1010    Subjective Done wiht prednizone and reports of increased pain today.    Patient is accompained by: Family member    Currently in Pain? Yes    Pain Score 5     Pain Location Hip    Pain Orientation Left    Pain Descriptors / Indicators Sharp    Pain Type Surgical pain    Pain Onset 1 to 4 weeks ago    Pain Frequency Constant    Aggravating Factors  moving    Pain Relieving Factors prednizone                             [facility] Adult PT Treatment/Exercise - 01/05/21 0001       Ambulation/Gait   Ambulation/Gait Yes    Ambulation Distance (Feet) 200 Feet    Assistive device Rolling walker    Gait Pattern Step-to pattern;Antalgic    Ambulation Surface Level;Indoor    Gait velocity decreased    Gait Comments Cueing for increased step length Rt LE and foot clearance      Exercises   Exercises Knee/Hip      Knee/Hip Exercises: Standing   Heel Raises 20 reps    Hip Flexion Both;10 reps;Knee bent    Hip Flexion Limitations alternating march    Hip Abduction Both;10 reps;Knee straight    Abduction Limitations BUE Support    Other Standing Knee Exercises sidestepping in // bars 5RT  Knee/Hip Exercises: Seated   Sit to Sand 5 reps;without UE support   elevated height     Knee/Hip Exercises: Supine   Bridges 15 reps;Both    Bridges Limitations 5 second holds      Knee/Hip Exercises: Sidelying   Clams 10                    PT Short Term Goals - 12/16/20 1042      PT SHORT TERM GOAL #1   Title Pt will be independent with HEP and perform consistently in order to promote return to PLOF.    Baseline in development    Time 3    Period Weeks    Status New    Target Date 01/06/21      PT SHORT TERM GOAL #2   Title Patient will demonstrate bed mobility with Supervision to  decrease level of assistance from caregivers    Baseline mod A    Time 3    Period Weeks    Status New    Target Date 01/06/21      PT SHORT TERM GOAL #3   Title Patient will demonstrate transfers with supervision to decrease level of care    Baseline Min A for sit to stand    Time 3    Period Weeks    Status New    Target Date 01/06/21      PT SHORT TERM GOAL #4   Title Patient will demonstrate improved ambulation tolerance as evidenced by distacne of 200 ft during 2MWT using least restrictive AD    Baseline 75 ft w/ RW and antalgic pattern with step-to pattern    Time 3    Period Weeks    Status New    Target Date 01/06/21             PT Long Term  Goals - 12/16/20 1045      PT LONG TERM GOAL #1   Title Patient will demonstrate 4/5 RLE to improve gait mechanics    Baseline 3-/5 left knee    Time 6    Period Weeks    Status New    Target Date 01/27/21      PT LONG TERM GOAL #2   Title Patient will improve on FOTO score to meet predicted outcomes to improve functional independence    Baseline 18% function    Time 6    Period Weeks    Status New    Target Date 01/27/21                 Plan - 01/05/21 1035    Clinical Impression Statement Gait training iwth cueing to improve stance phase Lt LE to improve mechanics.  Pt limited by pain this session with weight bearing Lt LE, monitored through session.  Pt presents wiht improved quad strength, was able to progress closed chain exercises with additional standing marching and heel raises.  Continued glut strengthening to assist with gait and ability to complete standing without HHA (did required elevated height).  EOS reports he feels a little better, no increased pain though does continue to be limited during weight bearing.    Personal Factors and Comorbidities Age;Comorbidity 3+;Fitness    Comorbidities see PMH    Examination-Activity Limitations Bathing;Bed Mobility;Bend;Carry;Dressing;Lift;Toileting;Stand;Stairs;Squat;Locomotion Level;Transfers    Examination-Participation Restrictions Cleaning;Community Activity;Driving;Laundry;Yard Work;Shop;Occupation    Stability/Clinical Decision Making Stable/Uncomplicated    Clinical Decision Making Low  Patient will benefit from skilled therapeutic intervention in order to improve the following deficits and impairments:  Abnormal gait,Decreased activity tolerance,Decreased balance,Decreased mobility,Decreased knowledge of use of DME,Decreased knowledge of precautions,Decreased endurance,Decreased coordination,Decreased range of motion,Decreased safety awareness,Decreased strength,Difficulty walking,Impaired perceived  functional ability,Improper body mechanics,Pain  Visit Diagnosis: Muscle weakness (generalized)  Pain in left leg  Difficulty in walking, not elsewhere classified     Problem List Patient Active Problem List   Diagnosis Date Noted  . 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  . Encounter for pre-operative respiratory clearance 11/16/2020  . 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 COPD with chronic bronchitis and emphysema ([place]) 09/22/2020  . Penile pain 04/07/2020  . Balanitis 04/07/2020  . Cervical myelopathy ([place]) 04/03/2017  . Thoracic ascending aortic aneurysm ([place]) 05/09/2016  . Thoracic aortic aneurysm without rupture ([place]) 01/17/2016  . Paroxysmal atrial fibrillation ([place]) 12/17/2015  . S/P partial thyroidectomy 01/20/2015   Ihor Austin, LPTA/CLT; CBIS [phone]  Aldona Lento 01/05/2021, 2:00 PM  [place] [address] [place], [place], [address] Phone: [phone]   Fax:  [phone]  Name: ADEM COSTLOW MRN: [id number] Date of Birth: 03/05/1941

## 2021-01-07 ENCOUNTER — Other Ambulatory Visit: Payer: Self-pay

## 2021-01-07 ENCOUNTER — Encounter (HOSPITAL_COMMUNITY): Payer: Self-pay | Admitting: Physical Therapy

## 2021-01-07 ENCOUNTER — Ambulatory Visit (HOSPITAL_COMMUNITY): Payer: Medicare Other | Admitting: Physical Therapy

## 2021-01-07 ENCOUNTER — Encounter (HOSPITAL_COMMUNITY): Payer: Medicare Other | Admitting: Physical Therapy

## 2021-01-07 DIAGNOSIS — M6281 Muscle weakness (generalized): Secondary | ICD-10-CM | POA: Diagnosis not present

## 2021-01-07 DIAGNOSIS — M79605 Pain in left leg: Secondary | ICD-10-CM

## 2021-01-07 DIAGNOSIS — R262 Difficulty in walking, not elsewhere classified: Secondary | ICD-10-CM

## 2021-01-07 NOTE — Therapy (Signed)
[place], [place], [address] Phone: [phone]   Fax:  [phone]  Physical Therapy Treatment  Patient Details  Name: Kyle Owen MRN: [id number] Date of Birth: May 26, 1941 Referring Provider (PT): Dr. [name]   Encounter Date: 01/07/2021   PT End of Session - 01/07/21 0959    Visit Number 9    Number of Visits 18    Date for PT Re-Evaluation 01/27/21    Authorization Type Medicare, 53 combined visits PT/OT/ST    Authorization - Visit Number 7    Authorization - Number of Visits 60    Progress Note Due on Visit 10    PT Start Time 1000    PT Stop Time 1040    PT Time Calculation (min) 40 min    Activity Tolerance Patient tolerated treatment well    Behavior During Therapy [facility] for tasks assessed/performed           Past Medical History:  Diagnosis Date  . Arthritis    DJD right knee  . Benign prostatic hypertrophy with urinary frequency   . Bladder cancer ([place])   . Complication of anesthesia    gets combative in recovery  . COPD with emphysema ([place])   . Depression   . Dyspnea    when walking  . Dysrhythmia    a fib  . History of colon polyps   . History of kidney stones   . History of loop recorder    loop recorder in place battery is dead has not had changed due to covid  . History of pneumonia    hx Recurrent -- last bout Dec 2016 CAP-- per pt resolved  . 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 Hyperlipidemia   . Hypertension   . 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 Nephrolithiasis    right non-obstructive  per CT 05-02-2016  . Ocular migraine    controlled w/ verapamil  . PAF (paroxysmal  atrial fibrillation) ([place])    followed by AFIB clinic-- cardiologist-- dr Marlou Porch  . Pneumonia    history of pnu.  . Pulmonary nodule, left    left lower lobe x2 per CT 01-20-2016  . Renal cyst, left   . Stroke ([place])    TIA's  . Thoracic aortic aneurysm without rupture ([place])    ascending -- ct chest 01/10/16  4.8cm  . Wears hearing aid    bilateral-- wear at times    Past Surgical History:  Procedure Laterality Date  . ANTERIOR CERVICAL DECOMP/DISCECTOMY FUSION N/A 04/03/2017   Procedure: Cervical three-four, Cervical four-five Anterior cervical decompression/discectomy/fusion;  Surgeon: Erline Levine, MD;  Location: [place];  Service: Neurosurgery;  Laterality: N/A;  . CATARACT EXTRACTION W/ INTRAOCULAR LENS IMPLANT Right 2016  . CATARACT EXTRACTION W/PHACO  12/16/2012   Procedure: CATARACT EXTRACTION PHACO AND INTRAOCULAR LENS PLACEMENT (IOC);  Surgeon: Tonny Branch, MD;  [facility];  Service: Ophthalmology;  Laterality: Left;  CDE:17.31  . COLONOSCOPY  10/03/2011   Procedure: COLONOSCOPY;  Surgeon: Jamesetta So;  [facility];  Service: Gastroenterology;  Laterality: N/A;  . CYSTOSCOPY/RETROGRADE/URETEROSCOPY/STONE EXTRACTION WITH BASKET Right 03/29/2020   Procedure: CYSTOSCOPY/RETROGRADE/URETEROSCOPY/STONE EXTRACTION WITH BASKET;  Surgeon: Franchot Gallo, MD;  [facility];  Service: Urology;  Laterality: Right;  1 HR  . DECOMPRESSIOIN ULNAR NERVE AND CUBITAL TUNNEL RELEASE Left 07-14-2009   elbow  . 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 HOLMIUM LASER APPLICATION Right [id number]   Procedure: HOLMIUM LASER APPLICATION;  Surgeon: Franchot Gallo, MD;  [facility];  Service: Urology;  Laterality: Right;  . INGUINAL HERNIA REPAIR Right 1990's  . KNEE ARTHROSCOPY Right 2015  . LEFT CUBITAL TUNNEL RELEASE    . POSTERIOR LUMBAR FUSION  2014   X2  . SHOULDER ARTHROSCOPY Left 1990's  . TEE WITHOUT  CARDIOVERSION N/A 07/27/2015   Procedure: TRANSESOPHAGEAL ECHOCARDIOGRAM (TEE);  Surgeon: Lelon Perla, MD;  Location: [facility] ENDOSCOPY;  Service: Cardiovascular;  Laterality: N/A;   normal LV function, ef 55-60%,  mild AR and MR, mild dilated ascending aorta (4.2cm),  mild to moderate atherosclerosis  descending aorta,  mild to moderate TR,  negative saline microcavitation study  . THYROIDECTOMY Right 01/20/2015   Procedure: RIGHT THYROIDECTOMY;  Surgeon: Ascencion Dike, MD;  Location: [place];  Service: ENT;  Laterality: Right;  . TONSILLECTOMY  as child  . TOTAL HIP ARTHROPLASTY Left 12/14/2020   Procedure: TOTAL HIP ARTHROPLASTY ANTERIOR APPROACH;  Surgeon: [name], MD;  [facility];  Service: Orthopedics;  Laterality: Left;  . TRANSURETHRAL RESECTION OF BLADDER TUMOR N/A 05/15/2016   Procedure: TRANSURETHRAL RESECTION OF BLADDER TUMOR (TURBT) AND INSTILLATION OF EPIRUBICIN;  Surgeon: Franchot Gallo, MD;  Location: [facility];  Service: Urology;  Laterality: N/A;  . TRANSURETHRAL RESECTION OF BLADDER TUMOR N/A 03/29/2020   Procedure: TRANSURETHRAL RESECTION OF BLADDER TUMOR (TURBT);  Surgeon: Franchot Gallo, MD;  [facility];  Service: Urology;  Laterality: N/A;    There were no vitals filed for this visit.   Subjective Assessment - 01/07/21 1004    Subjective Reports increased pain today and not feeling great. Reports pain of 6/10. Reports bleeding along the incision, not a lot, but drops, not saturated. Lateral walking increased pain to the point where wife stated she had to make him come back in. Reports he took an Oxy this morning    Patient is accompained by: Family member    Currently in Pain? Yes    Pain Score 6     Pain Location Hip    Pain Onset 1 to 4 weeks ago                             [facility] Adult PT Treatment/Exercise - 01/07/21 0001      Knee/Hip Exercises: Standing   Forward Step Up Both;2 sets;10 reps;Step Height:  4";Step Height: 6"   progress from 4" to 6". rest prn. increased WB on parallel bars for support.   Other Standing Knee Exercises toe taps onto 6" and 8" boxes   B UE support on parallel bars, increased difficulty with foot clearance intermittently, improved WB and SLS on surgical LE throughout.                 PT Education - 01/07/21 1100    Education Details Educated on HEP, mechanics, balance, scar care, infection prevention/signs, swelling. HEP: pedals, taps on stairs    Person(s) Educated Patient;Spouse    Methods Explanation;Demonstration    Comprehension Verbalized understanding;Returned demonstration            PT Short Term Goals -  12/16/20 1042      PT SHORT TERM GOAL #1   Title Pt will be independent with HEP and perform consistently in order to promote return to PLOF.    Baseline in development    Time 3    Period Weeks    Status New    Target Date 01/06/21      PT SHORT TERM GOAL #2   Title Patient will demonstrate bed mobility with Supervision to  decrease level of assistance from caregivers    Baseline mod A    Time 3    Period Weeks    Status New    Target Date 01/06/21      PT SHORT TERM GOAL #3   Title Patient will demonstrate transfers with supervision to decrease level of care    Baseline Min A for sit to stand    Time 3    Period Weeks    Status New    Target Date 01/06/21      PT SHORT TERM GOAL #4   Title Patient will demonstrate improved ambulation tolerance as evidenced by distacne of 200 ft during 2MWT using least restrictive AD    Baseline 75 ft w/ RW and antalgic pattern with step-to pattern    Time 3    Period Weeks    Status New    Target Date 01/06/21             PT Long Term Goals - 12/16/20 1045      PT LONG TERM GOAL #1   Title Patient will demonstrate 4/5 RLE to improve gait mechanics    Baseline 3-/5 left knee    Time 6    Period Weeks    Status New    Target Date 01/27/21      PT LONG TERM GOAL #2   Title  Patient will improve on FOTO score to meet predicted outcomes to improve functional independence    Baseline 18% function    Time 6    Period Weeks    Status New    Target Date 01/27/21                 Plan - 01/07/21 1103    Clinical Impression Statement [name] scar examined secondary to wife's reports of bleeding from surgical site, no redness, swelling, tenderness of note in area and educated patient on scar, signs and symptoms of infection, stitches, and infection prevention. PT focus today on hip and knee flexion and activation for improved functional mobility, but demo increased reliance on parallel bars for support vs isolated quad/glute strength for stability. Continues to demo increased reluctance and fear of falling during transitions. Reports no change in pain throughout session. Contnue on functional mobility, ambulation, and strength.    Personal Factors and Comorbidities Age;Comorbidity 3+;Fitness    Comorbidities see PMH    Examination-Activity Limitations Bathing;Bed Mobility;Bend;Carry;Dressing;Lift;Toileting;Stand;Stairs;Squat;Locomotion Level;Transfers    Examination-Participation Restrictions Cleaning;Community Activity;Driving;Laundry;Yard Work;Shop;Occupation    Stability/Clinical Decision Making Stable/Uncomplicated    Rehab Potential Excellent    PT Frequency 3x / week    PT Duration 6 weeks    PT Treatment/Interventions ADLs/Self Care Home Management;Aquatic Therapy;Biofeedback;Cryotherapy;Electrical Stimulation;DME Instruction;Ultrasound;Moist Heat;Gait training;Stair training;Functional mobility training;Therapeutic activities;Therapeutic exercise;Balance training;Patient/family education;Neuromuscular re-education;Manual techniques;Passive range of motion;Taping;Energy conservation;Dry needling;Spinal Manipulations;Joint Manipulations    PT Next Visit Plan Continue with LE AROM, strengthening, and gait training.  Step ups and toe taps, weight shifting.    PT  Home Exercise Plan quad sets, heel slides, ambulation with RW;  12/27 heel slides, hip abd/add/extension isometrics; 12/31/20: bridge and supine hip flexion. 1/10: standing hip abd, lateral walking with UE support.    Consulted and Agree with Plan of Care Patient;Family member/caregiver    Family Member Consulted wife           Patient will benefit from skilled therapeutic intervention in order to improve the following deficits and impairments:  Abnormal gait,Decreased activity tolerance,Decreased balance,Decreased mobility,Decreased knowledge of use of DME,Decreased knowledge of precautions,Decreased endurance,Decreased coordination,Decreased range of motion,Decreased safety awareness,Decreased strength,Difficulty walking,Impaired perceived functional ability,Improper body mechanics,Pain  Visit Diagnosis: Muscle weakness (generalized)  Pain in left leg  Difficulty in walking, not elsewhere classified     Problem List Patient Active Problem List   Diagnosis Date Noted  . 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  . Encounter for pre-operative respiratory clearance 11/16/2020  . 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 COPD with chronic bronchitis and emphysema ([place]) 09/22/2020  . Penile pain 04/07/2020  . Balanitis 04/07/2020  . Cervical myelopathy ([place]) 04/03/2017  . Thoracic ascending aortic aneurysm ([place]) 05/09/2016  . Thoracic aortic aneurysm without rupture ([place]) 01/17/2016  . Paroxysmal atrial fibrillation ([place]) 12/17/2015  . S/P partial thyroidectomy 01/20/2015    11:19 AM,01/07/21 Domenic Moras, PT, DPT Physical Therapist at [place], [place], [address] Phone: [phone]   Fax:  [phone]  Name: SHERYL TOWELL MRN: [id number] Date of Birth: 16-Nov-1941

## 2021-01-10 ENCOUNTER — Ambulatory Visit (HOSPITAL_COMMUNITY): Payer: Medicare Other | Admitting: Physical Therapy

## 2021-01-12 ENCOUNTER — Encounter (HOSPITAL_COMMUNITY): Payer: Self-pay | Admitting: Physical Therapy

## 2021-01-12 ENCOUNTER — Other Ambulatory Visit: Payer: Self-pay

## 2021-01-12 ENCOUNTER — Ambulatory Visit (HOSPITAL_COMMUNITY): Payer: Medicare Other | Admitting: Physical Therapy

## 2021-01-12 DIAGNOSIS — M6281 Muscle weakness (generalized): Secondary | ICD-10-CM | POA: Diagnosis not present

## 2021-01-12 DIAGNOSIS — R262 Difficulty in walking, not elsewhere classified: Secondary | ICD-10-CM

## 2021-01-12 DIAGNOSIS — M79605 Pain in left leg: Secondary | ICD-10-CM

## 2021-01-12 NOTE — Therapy (Signed)
[facility] [address] [place], [place], [address] Phone: [phone]   Fax:  [phone]  Physical Therapy Treatment and Progress Note   Patient Details  Name: Kyle Owen MRN: [id number] Date of Birth: 1941/07/11 Referring Provider (PT): Dr. Margarita Rana  Progress Note Reporting Period 12/16/20 to 01/12/21  See note below for Objective Data and Assessment of Progress/Goals.       Encounter Date: 01/12/2021   PT End of Session - 01/12/21 1323    Visit Number 10    Number of Visits 18    Date for PT Re-Evaluation 01/27/21    Authorization Type Medicare, 60 combined visits PT/OT/ST    Authorization - Visit Number 8    Authorization - Number of Visits 60    Progress Note Due on Visit 18    PT Start Time 1317    PT Stop Time 1355    PT Time Calculation (min) 38 min    Activity Tolerance Patient tolerated treatment well    Behavior During Therapy WFL for tasks assessed/performed           Past Medical History:  Diagnosis Date  . Arthritis    DJD right knee  . Benign prostatic hypertrophy with urinary frequency   . Bladder cancer (HCC)   . Complication of anesthesia    gets combative in recovery  . COPD with emphysema (HCC)   . Depression   . Dyspnea    when walking  . Dysrhythmia    a fib  . History of colon polyps   . History of kidney stones   . History of loop recorder    loop recorder in place battery is dead has not had changed due to covid  . History of pneumonia    hx Recurrent -- last bout Dec 2016 CAP-- per pt resolved  . History of TIA (transient ischemic attack) no residual per pt   per neurologist note (dr Marjory Lies 11-08-2015) dx cryptogenic TIA versus arrhythia versus dysautonomia (right ophthalmic artery TIA and x3 posterior circulation TIAs  . Hyperlipidemia   . Hypertension   . Multinodular thyroid    per pathology report 11-12-2014 bilateral thyroid  benign multinoduler follicular adenoma  and hyperplastic   . Nephrolithiasis    right non-obstructive  per CT 05-02-2016  . Ocular migraine    controlled w/ verapamil  . PAF (paroxysmal atrial fibrillation) (HCC)    followed by AFIB clinic-- cardiologist-- dr Anne Fu  . Pneumonia    history of pnu.  . Pulmonary nodule, left    left lower lobe x2 per CT 01-20-2016  . Renal cyst, left   . Stroke (HCC)    TIA's  . 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 Wears hearing aid    bilateral-- wear at times    Past Surgical History:  Procedure Laterality Date  . ANTERIOR CERVICAL DECOMP/DISCECTOMY FUSION N/A 04/03/2017   Procedure: Cervical three-four, Cervical four-five Anterior cervical decompression/discectomy/fusion;  Surgeon: Maeola Harman, MD;  Location: [facility] OR;  Service: Neurosurgery;  Laterality: N/A;  . CATARACT EXTRACTION W/ INTRAOCULAR LENS IMPLANT Right 2016  . CATARACT EXTRACTION W/PHACO  12/16/2012   Procedure: CATARACT EXTRACTION PHACO AND INTRAOCULAR LENS PLACEMENT (IOC);  Surgeon: Gemma Payor, MD;  [facility];  Service: Ophthalmology;  Laterality: Left;  CDE:17.31  . COLONOSCOPY  10/03/2011   Procedure: COLONOSCOPY;  Surgeon: Dalia Heading;  [facility];  Service: Gastroenterology;  Laterality: N/A;  . CYSTOSCOPY/RETROGRADE/URETEROSCOPY/STONE EXTRACTION WITH BASKET Right 03/29/2020   Procedure: CYSTOSCOPY/RETROGRADE/URETEROSCOPY/STONE EXTRACTION WITH BASKET;  Surgeon: Franchot Gallo, MD;  [facility];  Service: Urology;  Laterality: Right;  1 HR  . DECOMPRESSIOIN ULNAR NERVE AND CUBITAL TUNNEL RELEASE Left 07-14-2009   elbow  . 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 HOLMIUM LASER APPLICATION Right 4/0/9735   Procedure: HOLMIUM LASER APPLICATION;  Surgeon: Franchot Gallo, MD;  [facility];  Service: Urology;  Laterality: Right;  . INGUINAL HERNIA REPAIR Right 1990's  . KNEE  ARTHROSCOPY Right 2015  . LEFT CUBITAL TUNNEL RELEASE    . POSTERIOR LUMBAR FUSION  2014   X2  . SHOULDER ARTHROSCOPY Left 1990's  . TEE WITHOUT CARDIOVERSION N/A 07/27/2015   Procedure: TRANSESOPHAGEAL ECHOCARDIOGRAM (TEE);  Surgeon: Lelon Perla, MD;  Location: [facility] ENDOSCOPY;  Service: Cardiovascular;  Laterality: N/A;   normal LV function, ef 55-60%,  mild AR and MR, mild dilated ascending aorta (4.2cm),  mild to moderate atherosclerosis  descending aorta,  mild to moderate TR,  negative saline microcavitation study  . THYROIDECTOMY Right 01/20/2015   Procedure: RIGHT THYROIDECTOMY;  Surgeon: Ascencion Dike, MD;  Location: [place];  Service: ENT;  Laterality: Right;  . TONSILLECTOMY  as child  . TOTAL HIP ARTHROPLASTY Left 12/14/2020   Procedure: TOTAL HIP ARTHROPLASTY ANTERIOR APPROACH;  Surgeon: [name], MD;  [facility];  Service: Orthopedics;  Laterality: Left;  . TRANSURETHRAL RESECTION OF BLADDER TUMOR N/A 05/15/2016   Procedure: TRANSURETHRAL RESECTION OF BLADDER TUMOR (TURBT) AND INSTILLATION OF EPIRUBICIN;  Surgeon: Franchot Gallo, MD;  Location[facility];  Service: Urology;  Laterality: N/A;  . TRANSURETHRAL RESECTION OF BLADDER TUMOR N/A 03/29/2020   Procedure: TRANSURETHRAL RESECTION OF BLADDER TUMOR (TURBT);  Surgeon: Franchot Gallo, MD;  [facility];  Service: Urology;  Laterality: N/A;    There were no vitals filed for this visit.   Subjective Assessment - 01/12/21 1323    Subjective States overall he feels about 80% better in regards to symptoms but about 70% better in regards to function. In the last week he has made a lot more progress then he realizes. States he bathed himself for the first time this morning.    Patient is accompained by: Family member    Pain Onset 1 to 4 weeks ago              [facility] PT Assessment - 01/12/21 0001      Assessment   Medical Diagnosis L THR anterior approach    Referring Provider (PT) Dr.  [name]    Onset Date/Surgical Date 12/14/20    Next MD Visit 01/26/21      Observation/Other Assessments   Focus on Therapeutic Outcomes (FOTO)  46% function   predicted 46% function     Strength   Right Knee Flexion 5/5    Right Knee Extension 5/5    Left Knee Flexion 4+/5    Left Knee Extension 4+/5      Ambulation/Gait   Ambulation/Gait Yes    Ambulation Distance (Feet) 354 Feet   with walker   Gait Pattern Decreased step length - left;Decreased step length - right;Decreased hip/knee flexion - left;Decreased weight shift to left    Ambulation Surface Level;Indoor    Gait velocity decreased    Gait Comments 2MW, then 2MW with cane 226 feet  PT Education - 01/12/21 1339    Education Details on FOTO score, on progress made and Plan moving forward, on use of cane on the right side    Person(s) Educated Patient    Methods Explanation    Comprehension Verbalized understanding            PT Short Term Goals - 01/12/21 1324      PT SHORT TERM GOAL #1   Title Pt will be independent with HEP and perform consistently in order to promote return to PLOF.    Baseline in development    Time 3    Period Weeks    Status On-going    Target Date 01/06/21      PT SHORT TERM GOAL #2   Title Patient will demonstrate bed mobility with Supervision to  decrease level of assistance from caregivers    Baseline mod A    Time 3    Period Weeks    Status Achieved    Target Date 01/06/21      PT SHORT TERM GOAL #3   Title Patient will demonstrate transfers with supervision to decrease level of care    Baseline Min A for sit to stand    Time 3    Period Weeks    Status Achieved    Target Date 01/06/21      PT SHORT TERM GOAL #4   Title Patient will demonstrate improved ambulation tolerance as evidenced by distacne of 200 ft during 2MWT using least restrictive AD    Baseline --    Time 3    Period Weeks    Status Achieved     Target Date 01/06/21      PT SHORT TERM GOAL #5   Title Patient will be able to get socks and shoes on without assist to return to optimal funciton.    Time 2    Period Weeks    Status New    Target Date 01/26/21             PT Long Term Goals - 01/12/21 1332      PT LONG TERM GOAL #1   Title Patient will demonstrate 4/5 RLE to improve gait mechanics    Baseline --    Time 6    Period Weeks    Status Achieved      PT LONG TERM GOAL #2   Title Patient will improve on FOTO score to meet predicted outcomes to improve functional independence    Baseline 46% function    Time 6    Period Weeks    Status Achieved                 Plan - 01/12/21 1323    Clinical Impression Statement Overall patient improving in function and has met 5/6 goals, additional short term goal added to plan of care. Continued balance difficulties reported but this was present prior to surgery, Moving forward will continue to work on hip ROM and mobility as well as static and dynamic balance as tolerated. Patient with desire to walk outside and to be more independent in bathing and dressing. Will continue with current POC.    Personal Factors and Comorbidities Age;Comorbidity 3+;Fitness    Comorbidities see PMH    Examination-Activity Limitations Bathing;Bed Mobility;Bend;Carry;Dressing;Lift;Toileting;Stand;Stairs;Squat;Locomotion Level;Transfers    Examination-Participation Restrictions Cleaning;Community Activity;Driving;Laundry;Yard Work;Shop;Occupation    Stability/Clinical Decision Making Stable/Uncomplicated    Rehab Potential Excellent    PT Frequency 3x / week    PT  Duration 6 weeks    PT Treatment/Interventions ADLs/Self Care Home Management;Aquatic Therapy;Biofeedback;Cryotherapy;Electrical Stimulation;DME Instruction;Ultrasound;Moist Heat;Gait training;Stair training;Functional mobility training;Therapeutic activities;Therapeutic exercise;Balance training;Patient/family  education;Neuromuscular re-education;Manual techniques;Passive range of motion;Taping;Energy conservation;Dry needling;Spinal Manipulations;Joint Manipulations    PT Next Visit Plan Continue with hip ROM/mobility and static/dynamic balance    PT Home Exercise Plan quad sets, heel slides, ambulation with RW; 12/27 heel slides, hip abd/add/extension isometrics; 12/31/20: bridge and supine hip flexion. 1/10: standing hip abd, lateral walking with UE support.    Consulted and Agree with Plan of Care Patient;Family member/caregiver    Family Member Consulted wife           Patient will benefit from skilled therapeutic intervention in order to improve the following deficits and impairments:  Abnormal gait,Decreased activity tolerance,Decreased balance,Decreased mobility,Decreased knowledge of use of DME,Decreased knowledge of precautions,Decreased endurance,Decreased coordination,Decreased range of motion,Decreased safety awareness,Decreased strength,Difficulty walking,Impaired perceived functional ability,Improper body mechanics,Pain  Visit Diagnosis: Muscle weakness (generalized)  Pain in left leg  Difficulty in walking, not elsewhere classified     Problem List Patient Active Problem List   Diagnosis Date Noted  . 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  . Encounter for pre-operative respiratory clearance 11/16/2020  . 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 COPD with chronic bronchitis and emphysema ([place]) 09/22/2020  . Penile pain 04/07/2020  . Balanitis 04/07/2020  . Cervical myelopathy ([place]) 04/03/2017  . Thoracic ascending aortic aneurysm ([place]) 05/09/2016  . Thoracic aortic aneurysm without rupture ([place]) 01/17/2016  . Paroxysmal atrial fibrillation ([place]) 12/17/2015  . S/P partial thyroidectomy 01/20/2015    1:56 PM, 01/12/21 Jerene Pitch, DPT Physical Therapy with [facility]  [phone] office  [place] [address] [place], [place], [address] Phone: [phone]   Fax:  [phone]  Name: Kyle Owen MRN: [id number] Date of Birth: 12/28/1940

## 2021-01-14 ENCOUNTER — Other Ambulatory Visit: Payer: Self-pay

## 2021-01-14 ENCOUNTER — Ambulatory Visit (HOSPITAL_COMMUNITY): Payer: Medicare Other

## 2021-01-14 ENCOUNTER — Encounter (HOSPITAL_COMMUNITY): Payer: Self-pay

## 2021-01-14 DIAGNOSIS — M6281 Muscle weakness (generalized): Secondary | ICD-10-CM

## 2021-01-14 DIAGNOSIS — R262 Difficulty in walking, not elsewhere classified: Secondary | ICD-10-CM

## 2021-01-14 DIAGNOSIS — M79605 Pain in left leg: Secondary | ICD-10-CM

## 2021-01-14 NOTE — Therapy (Signed)
[place], [place], [address] Phone: [phone]   Fax:  [phone]  Physical Therapy Treatment  Patient Details  Name: Kyle Owen MRN: [id number] Date of Birth: 10/24/1941 Referring Provider (PT): Dr. [name]   Encounter Date: 01/14/2021   PT End of Session - 01/14/21 0914    Visit Number 11    Number of Visits 18    Date for PT Re-Evaluation 01/27/21    Authorization Type Medicare, 37 combined visits PT/OT/ST    Authorization - Visit Number 11    Authorization - Number of Visits 60    Progress Note Due on Visit 18    PT Start Time 0915    PT Stop Time 1000    PT Time Calculation (min) 45 min    Activity Tolerance Patient tolerated treatment well    Behavior During Therapy [facility] for tasks assessed/performed           Past Medical History:  Diagnosis Date  . Arthritis    DJD right knee  . Benign prostatic hypertrophy with urinary frequency   . Bladder cancer ([place])   . Complication of anesthesia    gets combative in recovery  . COPD with emphysema ([place])   . Depression   . Dyspnea    when walking  . Dysrhythmia    a fib  . History of colon polyps   . History of kidney stones   . History of loop recorder    loop recorder in place battery is dead has not had changed due to covid  . History of pneumonia    hx Recurrent -- last bout Dec 2016 CAP-- per pt resolved  . 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 Hyperlipidemia   . Hypertension   . 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 Nephrolithiasis    right non-obstructive  per CT 05-02-2016  . Ocular migraine    controlled w/ verapamil  . PAF (paroxysmal  atrial fibrillation) ([place])    followed by AFIB clinic-- cardiologist-- dr Marlou Porch  . Pneumonia    history of pnu.  . Pulmonary nodule, left    left lower lobe x2 per CT 01-20-2016  . Renal cyst, left   . Stroke ([place])    TIA's  . Thoracic aortic aneurysm without rupture ([place])    ascending -- ct chest 01/10/16  4.8cm  . Wears hearing aid    bilateral-- wear at times    Past Surgical History:  Procedure Laterality Date  . ANTERIOR CERVICAL DECOMP/DISCECTOMY FUSION N/A 04/03/2017   Procedure: Cervical three-four, Cervical four-five Anterior cervical decompression/discectomy/fusion;  Surgeon: Erline Levine, MD;  Location: [place];  Service: Neurosurgery;  Laterality: N/A;  . CATARACT EXTRACTION W/ INTRAOCULAR LENS IMPLANT Right 2016  . CATARACT EXTRACTION W/PHACO  12/16/2012   Procedure: CATARACT EXTRACTION PHACO AND INTRAOCULAR LENS PLACEMENT (IOC);  Surgeon: Tonny Branch, MD;  [facility];  Service: Ophthalmology;  Laterality: Left;  CDE:17.31  . COLONOSCOPY  10/03/2011   Procedure: COLONOSCOPY;  Surgeon: Jamesetta So;  [facility];  Service: Gastroenterology;  Laterality: N/A;  . CYSTOSCOPY/RETROGRADE/URETEROSCOPY/STONE EXTRACTION WITH BASKET Right 03/29/2020   Procedure: CYSTOSCOPY/RETROGRADE/URETEROSCOPY/STONE EXTRACTION WITH BASKET;  Surgeon: Franchot Gallo, MD;  [facility];  Service: Urology;  Laterality: Right;  1 HR  . DECOMPRESSIOIN ULNAR NERVE AND CUBITAL TUNNEL RELEASE Left 07-14-2009   elbow  . 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 HOLMIUM LASER APPLICATION Right [id number]   Procedure: HOLMIUM LASER APPLICATION;  Surgeon: Franchot Gallo, MD;  [facility];  Service: Urology;  Laterality: Right;  . INGUINAL HERNIA REPAIR Right 1990's  . KNEE ARTHROSCOPY Right 2015  . LEFT CUBITAL TUNNEL RELEASE    . POSTERIOR LUMBAR FUSION  2014   X2  . SHOULDER ARTHROSCOPY Left 1990's  . TEE WITHOUT  CARDIOVERSION N/A 07/27/2015   Procedure: TRANSESOPHAGEAL ECHOCARDIOGRAM (TEE);  Surgeon: Lelon Perla, MD;  Location: [facility] ENDOSCOPY;  Service: Cardiovascular;  Laterality: N/A;   normal LV function, ef 55-60%,  mild AR and MR, mild dilated ascending aorta (4.2cm),  mild to moderate atherosclerosis  descending aorta,  mild to moderate TR,  negative saline microcavitation study  . THYROIDECTOMY Right 01/20/2015   Procedure: RIGHT THYROIDECTOMY;  Surgeon: Ascencion Dike, MD;  Location: [place];  Service: ENT;  Laterality: Right;  . TONSILLECTOMY  as child  . TOTAL HIP ARTHROPLASTY Left 12/14/2020   Procedure: TOTAL HIP ARTHROPLASTY ANTERIOR APPROACH;  Surgeon: [name], MD;  [facility];  Service: Orthopedics;  Laterality: Left;  . TRANSURETHRAL RESECTION OF BLADDER TUMOR N/A 05/15/2016   Procedure: TRANSURETHRAL RESECTION OF BLADDER TUMOR (TURBT) AND INSTILLATION OF EPIRUBICIN;  Surgeon: Franchot Gallo, MD;  Location[facility];  Service: Urology;  Laterality: N/A;  . TRANSURETHRAL RESECTION OF BLADDER TUMOR N/A 03/29/2020   Procedure: TRANSURETHRAL RESECTION OF BLADDER TUMOR (TURBT);  Surgeon: Franchot Gallo, MD;  [facility];  Service: Urology;  Laterality: N/A;    There were no vitals filed for this visit.   Subjective Assessment - 01/14/21 0918    Subjective Patient reports no use of pain Rx today and is feeling a little more sore today.  Patient reports he has been faithful in his HEP completion and trials with cane at his house.    Patient is accompained by: Family member    Pain Onset 1 to 4 weeks ago                             [facility] Adult PT Treatment/Exercise - 01/14/21 0001      Ambulation/Gait   Ambulation/Gait Yes    Ambulation/Gait Assistance 5: Supervision    Ambulation Distance (Feet) 400 Feet    Assistive device Straight cane    Gait Pattern Decreased weight shift to left;Shuffle    Ambulation Surface Level    Gait  velocity decreased    Gait Comments cues for proper sequence and techniques to improve left foot clearance      Knee/Hip Exercises: Standing   Heel Raises 20 reps    Knee Flexion Strengthening;Both;2 sets;10 reps    Knee Flexion Limitations 3 lbs    Rocker Board 2 minutes   foot elevated on rocker board ant/post and lateral to facilitate postural weight shifting   SLS split stance with BUE overhead flexion 1x10 left/right to promote hip/back extension. 1x10 with 2 kg    SLS with Vectors static standing on foam 3x30 sec eyes open/closed.    Other Standing Knee Exercises toe taps onto 6" 2x2 min   3 lbs   Other Standing Knee Exercises side stepping  2x 2 min   3 lbs                 PT Education - 01/14/21 0926    Education Details Education in HEP additions for sidestepping and progressing single limb stance activities    Person(s) Educated Patient    Methods Explanation    Comprehension Verbalized understanding;Returned demonstration            PT Short Term Goals - 01/12/21 1324      PT SHORT TERM GOAL #1   Title Pt will be independent with HEP and perform consistently in order to promote return to PLOF.    Baseline in development    Time 3    Period Weeks    Status On-going    Target Date 01/06/21      PT SHORT TERM GOAL #2   Title Patient will demonstrate bed mobility with Supervision to  decrease level of assistance from caregivers    Baseline mod A    Time 3    Period Weeks    Status Achieved    Target Date 01/06/21      PT SHORT TERM GOAL #3   Title Patient will demonstrate transfers with supervision to decrease level of care    Baseline Min A for sit to stand    Time 3    Period Weeks    Status Achieved    Target Date 01/06/21      PT SHORT TERM GOAL #4   Title Patient will demonstrate improved ambulation tolerance as evidenced by distacne of 200 ft during 2MWT using least restrictive AD    Baseline --    Time 3    Period Weeks    Status Achieved     Target Date 01/06/21      PT SHORT TERM GOAL #5   Title Patient will be able to get socks and shoes on without assist to return to optimal funciton.    Time 2    Period Weeks    Status New    Target Date 01/26/21             PT Long Term Goals - 01/12/21 1332      PT LONG TERM GOAL #1   Title Patient will demonstrate 4/5 RLE to improve gait mechanics    Baseline --    Time 6    Period Weeks    Status Achieved      PT LONG TERM GOAL #2   Title Patient will improve on FOTO score to meet predicted outcomes to improve functional independence    Baseline 46% function    Time 6    Period Weeks    Status Achieved                 Plan - 01/14/21 0926    Clinical Impression Statement Progressing well with POC details and demo improved LLE weight acceptance during gait activities and is able to progress to trials with cane for ambulation albeit with supervision and cues for proper sequence and postural stability.  Improved gait mechanics with decrease in left hip drop but notable shuffling of left foot. Continued tx to focus on left hip/quad strength/coordination to facilitate normalized gait pattern    Personal Factors and Comorbidities Age;Comorbidity 3+;Fitness    Comorbidities see PMH    Examination-Activity Limitations Bathing;Bed Mobility;Bend;Carry;Dressing;Lift;Toileting;Stand;Stairs;Squat;Locomotion Level;Transfers    Examination-Participation Restrictions Cleaning;Community Activity;Driving;Laundry;Yard Work;Shop;Occupation    Stability/Clinical Decision Making Stable/Uncomplicated    Rehab Potential  Excellent    PT Frequency 3x / week    PT Duration 6 weeks    PT Treatment/Interventions ADLs/Self Care Home Management;Aquatic Therapy;Biofeedback;Cryotherapy;Electrical Stimulation;DME Instruction;Ultrasound;Moist Heat;Gait training;Stair training;Functional mobility training;Therapeutic activities;Therapeutic exercise;Balance training;Patient/family  education;Neuromuscular re-education;Manual techniques;Passive range of motion;Taping;Energy conservation;Dry needling;Spinal Manipulations;Joint Manipulations    PT Next Visit Plan Continue with hip ROM/mobility and static/dynamic balance    PT Home Exercise Plan quad sets, heel slides, ambulation with RW; 12/27 heel slides, hip abd/add/extension isometrics; 12/31/20: bridge and supine hip flexion. 1/10: standing hip abd, lateral walking with UE support.    Consulted and Agree with Plan of Care Patient;Family member/caregiver    Family Member Consulted wife           Patient will benefit from skilled therapeutic intervention in order to improve the following deficits and impairments:  Abnormal gait,Decreased activity tolerance,Decreased balance,Decreased mobility,Decreased knowledge of use of DME,Decreased knowledge of precautions,Decreased endurance,Decreased coordination,Decreased range of motion,Decreased safety awareness,Decreased strength,Difficulty walking,Impaired perceived functional ability,Improper body mechanics,Pain  Visit Diagnosis: Muscle weakness (generalized)  Pain in left leg  Difficulty in walking, not elsewhere classified     Problem List Patient Active Problem List   Diagnosis Date Noted  . 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  . Encounter for pre-operative respiratory clearance 11/16/2020  . 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 COPD with chronic bronchitis and emphysema ([place]) 09/22/2020  . Penile pain 04/07/2020  . Balanitis 04/07/2020  . Cervical myelopathy ([place]) 04/03/2017  . Thoracic ascending aortic aneurysm ([place]) 05/09/2016  . Thoracic aortic aneurysm without rupture ([place]) 01/17/2016  . Paroxysmal atrial fibrillation ([place]) 12/17/2015  . S/P partial thyroidectomy 01/20/2015    10:03 AM, 01/14/21 M. Sherlyn Lees, PT, DPT Physical Therapist- [place] Office Number: [phone]  [place] [address] [place], [place], [address] Phone: [phone]   Fax:  [phone]  Name: Kyle Owen MRN: [id number] Date of Birth: 1941-08-30

## 2021-01-17 ENCOUNTER — Ambulatory Visit (HOSPITAL_COMMUNITY): Payer: Medicare Other

## 2021-01-17 ENCOUNTER — Encounter (HOSPITAL_COMMUNITY): Payer: Self-pay

## 2021-01-17 ENCOUNTER — Other Ambulatory Visit: Payer: Self-pay

## 2021-01-17 DIAGNOSIS — R262 Difficulty in walking, not elsewhere classified: Secondary | ICD-10-CM

## 2021-01-17 DIAGNOSIS — M6281 Muscle weakness (generalized): Secondary | ICD-10-CM | POA: Diagnosis not present

## 2021-01-17 DIAGNOSIS — M79605 Pain in left leg: Secondary | ICD-10-CM

## 2021-01-17 NOTE — Therapy (Signed)
[facility] [address] [place], [place], [address] Phone: [phone]   Fax:  [phone]  Physical Therapy Treatment  Patient Details  Name: Kyle Owen MRN: [id number] Date of Birth: [birth year] Referring Provider (PT): Dr. [name]   Encounter Date: 01/17/2021   PT End of Session - 01/17/21 1456    Visit Number 12    Number of Visits 18    Date for PT Re-Evaluation 01/27/21    Authorization Type Medicare, 65 combined visits PT/OT/ST    Authorization - Visit Number 12    Authorization - Number of Visits 60    Progress Note Due on Visit 18    PT Start Time 1430    PT Stop Time 1510    PT Time Calculation (min) 40 min    Equipment Utilized During Treatment Gait belt    Activity Tolerance Patient tolerated treatment well    Behavior During Therapy [facility] for tasks assessed/performed           Past Medical History:  Diagnosis Date  . Arthritis    DJD right knee  . Benign prostatic hypertrophy with urinary frequency   . Bladder cancer ([place])   . Complication of anesthesia    gets combative in recovery  . COPD with emphysema ([place])   . Depression   . Dyspnea    when walking  . Dysrhythmia    a fib  . History of colon polyps   . History of kidney stones   . History of loop recorder    loop recorder in place battery is dead has not had changed due to covid  . History of pneumonia    hx Recurrent -- last bout Dec 2016 CAP-- per pt resolved  . 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 Hyperlipidemia   . Hypertension   . 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 Nephrolithiasis    right non-obstructive  per CT 05-02-2016  . Ocular migraine     controlled w/ verapamil  . PAF (paroxysmal atrial fibrillation) ([place])    followed by AFIB clinic-- cardiologist-- dr Marlou Porch  . Pneumonia    history of pnu.  . Pulmonary nodule, left    left lower lobe x2 per CT 01-20-2016  . Renal cyst, left   . Stroke ([place])    TIA's  . Thoracic aortic aneurysm without rupture ([place])    ascending -- ct chest 01/10/16  4.8cm  . Wears hearing aid    bilateral-- wear at times    Past Surgical History:  Procedure Laterality Date  . ANTERIOR CERVICAL DECOMP/DISCECTOMY FUSION N/A 04/03/2017   Procedure: Cervical three-four, Cervical four-five Anterior cervical decompression/discectomy/fusion;  Surgeon: Erline Levine, MD;  Location: [place];  Service: Neurosurgery;  Laterality: N/A;  . CATARACT EXTRACTION W/ INTRAOCULAR LENS IMPLANT Right 2016  . CATARACT EXTRACTION W/PHACO  12/16/2012   Procedure: CATARACT EXTRACTION PHACO AND INTRAOCULAR LENS PLACEMENT (IOC);  Surgeon: Tonny Branch, MD;  [facility];  Service: Ophthalmology;  Laterality: Left;  CDE:17.31  . COLONOSCOPY  10/03/2011   Procedure: COLONOSCOPY;  Surgeon: Jamesetta So;  [facility];  Service: Gastroenterology;  Laterality: N/A;  . CYSTOSCOPY/RETROGRADE/URETEROSCOPY/STONE EXTRACTION WITH BASKET Right 03/29/2020   Procedure: CYSTOSCOPY/RETROGRADE/URETEROSCOPY/STONE  EXTRACTION WITH BASKET;  Surgeon: Franchot Gallo, MD;  [facility];  Service: Urology;  Laterality: Right;  1 HR  . DECOMPRESSIOIN ULNAR NERVE AND CUBITAL TUNNEL RELEASE Left 07-14-2009   elbow  . 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 HOLMIUM LASER APPLICATION Right 8/0/9983   Procedure: HOLMIUM LASER APPLICATION;  Surgeon: Franchot Gallo, MD;  [facility];  Service: Urology;  Laterality: Right;  . INGUINAL HERNIA REPAIR Right 1990's  . KNEE ARTHROSCOPY Right 2015  . LEFT CUBITAL TUNNEL RELEASE    . POSTERIOR LUMBAR FUSION  2014   X2  . SHOULDER  ARTHROSCOPY Left 1990's  . TEE WITHOUT CARDIOVERSION N/A 07/27/2015   Procedure: TRANSESOPHAGEAL ECHOCARDIOGRAM (TEE);  Surgeon: Lelon Perla, MD;  [facility];  Service: Cardiovascular;  Laterality: N/A;   normal LV function, ef 55-60%,  mild AR and MR, mild dilated ascending aorta (4.2cm),  mild to moderate atherosclerosis  descending aorta,  mild to moderate TR,  negative saline microcavitation study  . THYROIDECTOMY Right 01/20/2015   Procedure: RIGHT THYROIDECTOMY;  Surgeon: Ascencion Dike, MD;  Location: [place];  Service: ENT;  Laterality: Right;  . TONSILLECTOMY  as child  . TOTAL HIP ARTHROPLASTY Left 12/14/2020   Procedure: TOTAL HIP ARTHROPLASTY ANTERIOR APPROACH;  Surgeon: [name], MD;  [facility];  Service: Orthopedics;  Laterality: Left;  . TRANSURETHRAL RESECTION OF BLADDER TUMOR N/A 05/15/2016   Procedure: TRANSURETHRAL RESECTION OF BLADDER TUMOR (TURBT) AND INSTILLATION OF EPIRUBICIN;  Surgeon: Franchot Gallo, MD;  Location: [facility];  Service: Urology;  Laterality: N/A;  . TRANSURETHRAL RESECTION OF BLADDER TUMOR N/A 03/29/2020   Procedure: TRANSURETHRAL RESECTION OF BLADDER TUMOR (TURBT);  Surgeon: Franchot Gallo, MD;  [facility];  Service: Urology;  Laterality: N/A;    There were no vitals filed for this visit.   Subjective Assessment - 01/17/21 1442    Subjective Patient reports soreness with sidestepping activity and has been encouraged to discontinue this for the next week to assess if this is the culprit beind the left groin pain.    Patient is accompained by: Family member    Pain Onset 1 to 4 weeks ago                             [facility] Adult PT Treatment/Exercise - 01/17/21 0001      Knee/Hip Exercises: Standing   Heel Raises Both   3x10 with ball between ankles   Rocker Board 2 minutes    Rocker Board Limitations frequent rebalacing needed via assist rails    SLS split stance with BUE overhead  flexion 2x10 left/right to promote hip/back extension. 2x10 with 2 kg    SLS with Vectors static standing on foam 4x30 sec eyes open/closed.    Other Standing Knee Exercises toe taps onto 6" 2x2 min    Other Standing Knee Exercises retrowalking 4x30 ft                  PT Education - 01/17/21 1455    Education Details Pt educated on activity modification to decrease left groin pain    Person(s) Educated Patient    Methods Explanation    Comprehension Verbalized understanding            PT Short Term Goals - 01/12/21 1324      PT SHORT TERM GOAL #  1   Title Pt will be independent with HEP and perform consistently in order to promote return to PLOF.    Baseline in development    Time 3    Period Weeks    Status On-going    Target Date 01/06/21      PT SHORT TERM GOAL #2   Title Patient will demonstrate bed mobility with Supervision to  decrease level of assistance from caregivers    Baseline mod A    Time 3    Period Weeks    Status Achieved    Target Date 01/06/21      PT SHORT TERM GOAL #3   Title Patient will demonstrate transfers with supervision to decrease level of care    Baseline Min A for sit to stand    Time 3    Period Weeks    Status Achieved    Target Date 01/06/21      PT SHORT TERM GOAL #4   Title Patient will demonstrate improved ambulation tolerance as evidenced by distacne of 200 ft during 2MWT using least restrictive AD    Baseline --    Time 3    Period Weeks    Status Achieved    Target Date 01/06/21      PT SHORT TERM GOAL #5   Title Patient will be able to get socks and shoes on without assist to return to optimal funciton.    Time 2    Period Weeks    Status New    Target Date 01/26/21             PT Long Term Goals - 01/12/21 1332      PT LONG TERM GOAL #1   Title Patient will demonstrate 4/5 RLE to improve gait mechanics    Baseline --    Time 6    Period Weeks    Status Achieved      PT LONG TERM GOAL #2   Title  Patient will improve on FOTO score to meet predicted outcomes to improve functional independence    Baseline 46% function    Time 6    Period Weeks    Status Achieved                 Plan - 01/17/21 1456    Clinical Impression Statement Pt is progressing well with POC details and reports onset of left groin pain which he relates to sidestepping exercise.  Patient reports he has primarily switched to using cane vs RW and reports improved ambulation tolerance.  Patient tolerating increased activities and dynamic balance activities without adverse effects.    Personal Factors and Comorbidities Age;Comorbidity 3+;Fitness    Comorbidities see PMH    Examination-Activity Limitations Bathing;Bed Mobility;Bend;Carry;Dressing;Lift;Toileting;Stand;Stairs;Squat;Locomotion Level;Transfers    Examination-Participation Restrictions Cleaning;Community Activity;Driving;Laundry;Yard Work;Shop;Occupation    Stability/Clinical Decision Making Stable/Uncomplicated    Rehab Potential Excellent    PT Frequency 3x / week    PT Duration 6 weeks    PT Treatment/Interventions ADLs/Self Care Home Management;Aquatic Therapy;Biofeedback;Cryotherapy;Electrical Stimulation;DME Instruction;Ultrasound;Moist Heat;Gait training;Stair training;Functional mobility training;Therapeutic activities;Therapeutic exercise;Balance training;Patient/family education;Neuromuscular re-education;Manual techniques;Passive range of motion;Taping;Energy conservation;Dry needling;Spinal Manipulations;Joint Manipulations    PT Next Visit Plan Continue with hip ROM/mobility and static/dynamic balance    PT Home Exercise Plan quad sets, heel slides, ambulation with RW; 12/27 heel slides, hip abd/add/extension isometrics; 12/31/20: bridge and supine hip flexion. 1/10: standing hip abd, lateral walking with UE support.    Consulted and Agree with Plan of Care Patient;Family  member/caregiver    Family Member Consulted wife           Patient  will benefit from skilled therapeutic intervention in order to improve the following deficits and impairments:  Abnormal gait,Decreased activity tolerance,Decreased balance,Decreased mobility,Decreased knowledge of use of DME,Decreased knowledge of precautions,Decreased endurance,Decreased coordination,Decreased range of motion,Decreased safety awareness,Decreased strength,Difficulty walking,Impaired perceived functional ability,Improper body mechanics,Pain  Visit Diagnosis: Muscle weakness (generalized)  Pain in left leg  Difficulty in walking, not elsewhere classified     Problem List Patient Active Problem List   Diagnosis Date Noted  . 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  . Encounter for pre-operative respiratory clearance 11/16/2020  . 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 COPD with chronic bronchitis and emphysema ([place]) 09/22/2020  . Penile pain 04/07/2020  . Balanitis 04/07/2020  . Cervical myelopathy ([place]) 04/03/2017  . Thoracic ascending aortic aneurysm ([place]) 05/09/2016  . Thoracic aortic aneurysm without rupture ([place]) 01/17/2016  . Paroxysmal atrial fibrillation ([place]) 12/17/2015  . S/P partial thyroidectomy 01/20/2015    3:23 PM, 01/17/21 M. Sherlyn Lees, PT, DPT Physical Therapist- [place] Office Number: [phone]  [facility] [address] [place], [place], [address] Phone: [phone]   Fax:  [phone]  Name: Kyle Owen MRN: [id number] Date of Birth: 10/14/1941

## 2021-01-19 ENCOUNTER — Encounter (HOSPITAL_COMMUNITY): Payer: Self-pay

## 2021-01-19 ENCOUNTER — Ambulatory Visit (HOSPITAL_COMMUNITY): Payer: Medicare Other

## 2021-01-19 ENCOUNTER — Other Ambulatory Visit: Payer: Self-pay

## 2021-01-19 DIAGNOSIS — M6281 Muscle weakness (generalized): Secondary | ICD-10-CM | POA: Diagnosis not present

## 2021-01-19 DIAGNOSIS — R262 Difficulty in walking, not elsewhere classified: Secondary | ICD-10-CM

## 2021-01-19 DIAGNOSIS — M79605 Pain in left leg: Secondary | ICD-10-CM

## 2021-01-19 NOTE — Therapy (Signed)
[facility] [place], [place], [address] Phone: [phone]   Fax:  [phone]  Physical Therapy Treatment  Patient Details  Name: Kyle Owen MRN: [id number] Date of Birth: [birth year] Referring Provider (PT): Dr. [name]   Encounter Date: 01/19/2021   PT End of Session - 01/19/21 1100    Visit Number 13    Number of Visits 18    Date for PT Re-Evaluation 01/27/21    Authorization Type Medicare, 12 combined visits PT/OT/ST    Authorization - Visit Number 55    Authorization - Number of Visits 60    Progress Note Due on Visit 18    PT Start Time 1004    PT Stop Time 1046    PT Time Calculation (min) 42 min    Equipment Utilized During Treatment Gait belt    Activity Tolerance Patient tolerated treatment well    Behavior During Therapy WFL for tasks assessed/performed           Past Medical History:  Diagnosis Date  . Arthritis    DJD right knee  . Benign prostatic hypertrophy with urinary frequency   . Bladder cancer ([place])   . Complication of anesthesia    gets combative in recovery  . COPD with emphysema ([place])   . Depression   . Dyspnea    when walking  . Dysrhythmia    a fib  . History of colon polyps   . History of kidney stones   . History of loop recorder    loop recorder in place battery is dead has not had changed due to covid  . History of pneumonia    hx Recurrent -- last bout Dec 2016 CAP-- per pt resolved  . 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 Hyperlipidemia   . Hypertension   . 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 Nephrolithiasis    right non-obstructive  per CT 05-02-2016  . Ocular migraine     controlled w/ verapamil  . PAF (paroxysmal atrial fibrillation) ([place])    followed by AFIB clinic-- cardiologist-- dr Marlou Porch  . Pneumonia    history of pnu.  . Pulmonary nodule, left    left lower lobe x2 per CT 01-20-2016  . Renal cyst, left   . Stroke ([place])    TIA's  . Thoracic aortic aneurysm without rupture ([place])    ascending -- ct chest 01/10/16  4.8cm  . Wears hearing aid    bilateral-- wear at times    Past Surgical History:  Procedure Laterality Date  . ANTERIOR CERVICAL DECOMP/DISCECTOMY FUSION N/A 04/03/2017   Procedure: Cervical three-four, Cervical four-five Anterior cervical decompression/discectomy/fusion;  Surgeon: Erline Levine, MD;  Location: [place];  Service: Neurosurgery;  Laterality: N/A;  . CATARACT EXTRACTION W/ INTRAOCULAR LENS IMPLANT Right 2016  . CATARACT EXTRACTION W/PHACO  12/16/2012   Procedure: CATARACT EXTRACTION PHACO AND INTRAOCULAR LENS PLACEMENT (IOC);  Surgeon: Tonny Branch, MD;  [facility];  Service: Ophthalmology;  Laterality: Left;  CDE:17.31  . COLONOSCOPY  10/03/2011   Procedure: COLONOSCOPY;  Surgeon: Jamesetta So;  [facility];  Service: Gastroenterology;  Laterality: N/A;  . CYSTOSCOPY/RETROGRADE/URETEROSCOPY/STONE EXTRACTION WITH BASKET Right 03/29/2020   Procedure: CYSTOSCOPY/RETROGRADE/URETEROSCOPY/STONE  EXTRACTION WITH BASKET;  Surgeon: Marcine Matarahlstedt, Stephen, MD;  [facility];  Service: Urology;  Laterality: Right;  1 HR  . DECOMPRESSIOIN ULNAR NERVE AND CUBITAL TUNNEL RELEASE Left 07-14-2009   elbow  . EP IMPLANTABLE DEVICE N/A 08/10/2015   MDT ILR implanted by Dr Johney FrameAllred for cryptogenic stroke  . HOLMIUM LASER APPLICATION Right 03/29/2020   Procedure: HOLMIUM LASER APPLICATION;  Surgeon: Marcine Matarahlstedt, Stephen, MD;  [facility];  Service: Urology;  Laterality: Right;  . INGUINAL HERNIA REPAIR Right 1990's  . KNEE ARTHROSCOPY Right 2015  . LEFT CUBITAL TUNNEL RELEASE    . POSTERIOR LUMBAR FUSION  2014   X2  . SHOULDER  ARTHROSCOPY Left 1990's  . TEE WITHOUT CARDIOVERSION N/A 07/27/2015   Procedure: TRANSESOPHAGEAL ECHOCARDIOGRAM (TEE);  Surgeon: Lewayne BuntingBrian S Crenshaw, MD;  [facility];  Service: Cardiovascular;  Laterality: N/A;   normal LV function, ef 55-60%,  mild AR and MR, mild dilated ascending aorta (4.2cm),  mild to moderate atherosclerosis  descending aorta,  mild to moderate TR,  negative saline microcavitation study  . THYROIDECTOMY Right 01/20/2015   Procedure: RIGHT THYROIDECTOMY;  Surgeon: Darletta MollSui W Teoh, MD;  [facility];  Service: ENT;  Laterality: Right;  . TONSILLECTOMY  as child  . TOTAL HIP ARTHROPLASTY Left 12/14/2020   Procedure: TOTAL HIP ARTHROPLASTY ANTERIOR APPROACH;  Surgeon: Sheral ApleyMurphy, Timothy D, MD;  [facility];  Service: Orthopedics;  Laterality: Left;  . TRANSURETHRAL RESECTION OF BLADDER TUMOR N/A 05/15/2016   Procedure: TRANSURETHRAL RESECTION OF BLADDER TUMOR (TURBT) AND INSTILLATION OF EPIRUBICIN;  Surgeon: Marcine MatarStephen Dahlstedt, MD;  [facility] [place];  Service: Urology;  Laterality: N/A;  . TRANSURETHRAL RESECTION OF BLADDER TUMOR N/A 03/29/2020   Procedure: TRANSURETHRAL RESECTION OF BLADDER TUMOR (TURBT);  Surgeon: Marcine Matarahlstedt, Stephen, MD;  [facility];  Service: Urology;  Laterality: N/A;    There were no vitals filed for this visit.   Subjective Assessment - 01/19/21 1004    Subjective Pt arrived with cane, no report of fall or LOB.  Pain scale 5/10, meds taken prior session.    Currently in Pain? Yes    Pain Score 5     Pain Location Hip    Pain Orientation Left    Pain Descriptors / Indicators Sharp    Pain Type Surgical pain    Pain Onset 1 to 4 weeks ago    Pain Frequency Intermittent    Aggravating Factors  moving    Pain Relieving Factors prednizone                             [facility] Adult PT Treatment/Exercise - 01/19/21 0001      Knee/Hip Exercises: Standing   Heel Raises 3 sets;10 reps    Hip Flexion Both;10  reps    Hip Flexion Limitations Toe tapping 6in step alternating with no to minimal HHA during Lt LE weight bearing    Functional Squat 10 reps    Functional Squat Limitations 3D hip excursion, UE support and chair behind with squats    SLS with Vectors 3x5" BLE with1 HHA    Other Standing Knee Exercises tandem stance with red theraball paloff wiht Rt foot on 6in step    Other Standing Knee Exercises Retro and sidestep 2RT x 30 ft                    PT Short Term Goals - 01/12/21 1324  PT SHORT TERM GOAL #1   Title Pt will be independent with HEP and perform consistently in order to promote return to PLOF.    Baseline in development    Time 3    Period Weeks    Status On-going    Target Date 01/06/21      PT SHORT TERM GOAL #2   Title Patient will demonstrate bed mobility with Supervision to  decrease level of assistance from caregivers    Baseline mod A    Time 3    Period Weeks    Status Achieved    Target Date 01/06/21      PT SHORT TERM GOAL #3   Title Patient will demonstrate transfers with supervision to decrease level of care    Baseline Min A for sit to stand    Time 3    Period Weeks    Status Achieved    Target Date 01/06/21      PT SHORT TERM GOAL #4   Title Patient will demonstrate improved ambulation tolerance as evidenced by distacne of 200 ft during 2MWT using least restrictive AD    Baseline --    Time 3    Period Weeks    Status Achieved    Target Date 01/06/21      PT SHORT TERM GOAL #5   Title Patient will be able to get socks and shoes on without assist to return to optimal funciton.    Time 2    Period Weeks    Status New    Target Date 01/26/21             PT Long Term Goals - 01/12/21 1332      PT LONG TERM GOAL #1   Title Patient will demonstrate 4/5 RLE to improve gait mechanics    Baseline --    Time 6    Period Weeks    Status Achieved      PT LONG TERM GOAL #2   Title Patient will improve on FOTO score to meet  predicted outcomes to improve functional independence    Baseline 46% function    Time 6    Period Weeks    Status Achieved                 Plan - 01/19/21 1101    Clinical Impression Statement Pt demonstated decreased mobility initial sessoin, began session with 3D hip excursion for mobility as well as additional gluteal strengthening with squats.  Pt required UE support and chair behind wiht new exercise for safety.  Session focus with functional strengthening and balance training.  Added tandem stance with Rt foot on 6in step and vector stance to improve Lt LE weight bearing.  Pt tolerated well to session with no reports of pain through session.    Personal Factors and Comorbidities Age;Comorbidity 3+;Fitness    Comorbidities see PMH    Examination-Activity Limitations Bathing;Bed Mobility;Bend;Carry;Dressing;Lift;Toileting;Stand;Stairs;Squat;Locomotion Level;Transfers    Examination-Participation Restrictions Cleaning;Community Activity;Driving;Laundry;Yard Work;Shop;Occupation    Stability/Clinical Decision Making Stable/Uncomplicated    Clinical Decision Making Low    Rehab Potential Excellent    PT Frequency 3x / week    PT Duration 6 weeks    PT Treatment/Interventions ADLs/Self Care Home Management;Aquatic Therapy;Biofeedback;Cryotherapy;Electrical Stimulation;DME Instruction;Ultrasound;Moist Heat;Gait training;Stair training;Functional mobility training;Therapeutic activities;Therapeutic exercise;Balance training;Patient/family education;Neuromuscular re-education;Manual techniques;Passive range of motion;Taping;Energy conservation;Dry needling;Spinal Manipulations;Joint Manipulations    PT Next Visit Plan Continue with hip ROM/mobility and static/dynamic balance    PT Home Exercise Plan quad  sets, heel slides, ambulation with RW; 12/27 heel slides, hip abd/add/extension isometrics; 12/31/20: bridge and supine hip flexion. 1/10: standing hip abd, lateral walking with UE support.;  01/19/21: squat with UE support and chair behing    Consulted and Agree with Plan of Care Patient;Family member/caregiver    Family Member Consulted wife           Patient will benefit from skilled therapeutic intervention in order to improve the following deficits and impairments:  Abnormal gait,Decreased activity tolerance,Decreased balance,Decreased mobility,Decreased knowledge of use of DME,Decreased knowledge of precautions,Decreased endurance,Decreased coordination,Decreased range of motion,Decreased safety awareness,Decreased strength,Difficulty walking,Impaired perceived functional ability,Improper body mechanics,Pain  Visit Diagnosis: Pain in left leg  Difficulty in walking, not elsewhere classified  Muscle weakness (generalized)     Problem List Patient Active Problem List   Diagnosis Date Noted  . 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  . Encounter for pre-operative respiratory clearance 11/16/2020  . 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 COPD with chronic bronchitis and emphysema ([place]) 09/22/2020  . Penile pain 04/07/2020  . Balanitis 04/07/2020  . Cervical myelopathy ([place]) 04/03/2017  . Thoracic ascending aortic aneurysm ([place]) 05/09/2016  . 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 Paroxysmal atrial fibrillation ([place]) 12/17/2015  . S/P partial thyroidectomy 01/20/2015   Ihor Austin, LPTA/CLT; CBIS [phone]  Aldona Lento 01/19/2021, 11:06 AM  [place], [place], [address] Phone: [phone]   Fax:  [phone]  Name: Kyle Owen MRN: [id number] Date of Birth: 03-16-1941

## 2021-01-21 ENCOUNTER — Encounter (HOSPITAL_COMMUNITY): Payer: Self-pay

## 2021-01-21 ENCOUNTER — Other Ambulatory Visit: Payer: Self-pay

## 2021-01-21 ENCOUNTER — Ambulatory Visit (HOSPITAL_COMMUNITY): Payer: Medicare Other

## 2021-01-21 DIAGNOSIS — M6281 Muscle weakness (generalized): Secondary | ICD-10-CM | POA: Diagnosis not present

## 2021-01-21 DIAGNOSIS — M79605 Pain in left leg: Secondary | ICD-10-CM

## 2021-01-21 DIAGNOSIS — R262 Difficulty in walking, not elsewhere classified: Secondary | ICD-10-CM

## 2021-01-21 NOTE — Therapy (Signed)
[place], [place], [address] Phone: [phone]   Fax:  [phone]  Physical Therapy Treatment  Patient Details  Name: Kyle Owen MRN: [id number] Date of Birth: Apr 21, 1941 Referring Provider (PT): Dr. [name]   Encounter Date: 01/21/2021   PT End of Session - 01/21/21 1131    Visit Number 14    Number of Visits 18    Date for PT Re-Evaluation 01/27/21    Authorization Type Medicare, 72 combined visits PT/OT/ST    Authorization - Visit Number 54    Authorization - Number of Visits 60    Progress Note Due on Visit 18    PT Start Time 1128    PT Stop Time 1215    PT Time Calculation (min) 47 min    Equipment Utilized During Treatment Gait belt    Activity Tolerance Patient tolerated treatment well    Behavior During Therapy WFL for tasks assessed/performed           Past Medical History:  Diagnosis Date  . Arthritis    DJD right knee  . Benign prostatic hypertrophy with urinary frequency   . Bladder cancer ([place])   . Complication of anesthesia    gets combative in recovery  . COPD with emphysema ([place])   . Depression   . Dyspnea    when walking  . Dysrhythmia    a fib  . History of colon polyps   . History of kidney stones   . History of loop recorder    loop recorder in place battery is dead has not had changed due to covid  . History of pneumonia    hx Recurrent -- last bout Dec 2016 CAP-- per pt resolved  . 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 Hyperlipidemia   . Hypertension   . 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 Nephrolithiasis    right non-obstructive  per CT 05-02-2016  . Ocular migraine     controlled w/ verapamil  . PAF (paroxysmal atrial fibrillation) ([place])    followed by AFIB clinic-- cardiologist-- dr Marlou Porch  . Pneumonia    history of pnu.  . Pulmonary nodule, left    left lower lobe x2 per CT 01-20-2016  . Renal cyst, left   . Stroke ([place])    TIA's  . Thoracic aortic aneurysm without rupture ([place])    ascending -- ct chest 01/10/16  4.8cm  . Wears hearing aid    bilateral-- wear at times    Past Surgical History:  Procedure Laterality Date  . ANTERIOR CERVICAL DECOMP/DISCECTOMY FUSION N/A 04/03/2017   Procedure: Cervical three-four, Cervical four-five Anterior cervical decompression/discectomy/fusion;  Surgeon: Erline Levine, MD;  Location: [facility];  Service: Neurosurgery;  Laterality: N/A;  . CATARACT EXTRACTION W/ INTRAOCULAR LENS IMPLANT Right 2016  . CATARACT EXTRACTION W/PHACO  12/16/2012   Procedure: CATARACT EXTRACTION PHACO AND INTRAOCULAR LENS PLACEMENT (IOC);  Surgeon: Tonny Branch, MD;  [facility];  Service: Ophthalmology;  Laterality: Left;  CDE:17.31  . COLONOSCOPY  10/03/2011   Procedure: COLONOSCOPY;  Surgeon: Jamesetta So;  [facility];  Service: Gastroenterology;  Laterality: N/A;  . CYSTOSCOPY/RETROGRADE/URETEROSCOPY/STONE EXTRACTION WITH BASKET Right 03/29/2020   Procedure: CYSTOSCOPY/RETROGRADE/URETEROSCOPY/STONE  EXTRACTION WITH BASKET;  Surgeon: Franchot Gallo, MD;  [facility];  Service: Urology;  Laterality: Right;  1 HR  . DECOMPRESSIOIN ULNAR NERVE AND CUBITAL TUNNEL RELEASE Left 07-14-2009   elbow  . 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 HOLMIUM LASER APPLICATION Right [id number]   Procedure: HOLMIUM LASER APPLICATION;  Surgeon: Franchot Gallo, MD;  [facility];  Service: Urology;  Laterality: Right;  . INGUINAL HERNIA REPAIR Right 1990's  . KNEE ARTHROSCOPY Right 2015  . LEFT CUBITAL TUNNEL RELEASE    . POSTERIOR LUMBAR FUSION  2014   X2  . SHOULDER  ARTHROSCOPY Left 1990's  . TEE WITHOUT CARDIOVERSION N/A 07/27/2015   Procedure: TRANSESOPHAGEAL ECHOCARDIOGRAM (TEE);  Surgeon: Lelon Perla, MD;  Location: [facility];  Service: Cardiovascular;  Laterality: N/A;   normal LV function, ef 55-60%,  mild AR and MR, mild dilated ascending aorta (4.2cm),  mild to moderate atherosclerosis  descending aorta,  mild to moderate TR,  negative saline microcavitation study  . THYROIDECTOMY Right 01/20/2015   Procedure: RIGHT THYROIDECTOMY;  Surgeon: Ascencion Dike, MD;  Location: [facility];  Service: ENT;  Laterality: Right;  . TONSILLECTOMY  as child  . TOTAL HIP ARTHROPLASTY Left 12/14/2020   Procedure: TOTAL HIP ARTHROPLASTY ANTERIOR APPROACH;  Surgeon: [name], MD;  [facility];  Service: Orthopedics;  Laterality: Left;  . TRANSURETHRAL RESECTION OF BLADDER TUMOR N/A 05/15/2016   Procedure: TRANSURETHRAL RESECTION OF BLADDER TUMOR (TURBT) AND INSTILLATION OF EPIRUBICIN;  Surgeon: Franchot Gallo, MD;  Location: [facility];  Service: Urology;  Laterality: N/A;  . TRANSURETHRAL RESECTION OF BLADDER TUMOR N/A 03/29/2020   Procedure: TRANSURETHRAL RESECTION OF BLADDER TUMOR (TURBT);  Surgeon: Franchot Gallo, MD;  [facility];  Service: Urology;  Laterality: N/A;    There were no vitals filed for this visit.   Subjective Assessment - 01/21/21 1129    Subjective Pt stated he is feeling good today.  Pain meds taken prior sessoin today.    Currently in Pain? No/denies                             [facility] Adult PT Treatment/Exercise - 01/21/21 0001      Knee/Hip Exercises: Standing   Hip Flexion Both;15 reps    Hip Flexion Limitations Toe tapping 6in alternating minimal to no hand use    Forward Step Up Both;15 reps;Hand Hold: 1;Step Height: 6"    Step Down Both;10 reps;Hand Hold: 1;Step Height: 4"    Functional Squat 10 reps    Functional Squat Limitations 3D hip excursion, UE support with squats     SLS with Vectors 3x5" BLE with1 HHA    Other Standing Knee Exercises tandem stance wiht Rt foot on 6in step x 2 min no HHA    Other Standing Knee Exercises Retro and sidestep 2RT x 30 ft                    PT Short Term Goals - 01/12/21 1324      PT SHORT TERM GOAL #1   Title Pt will be independent with HEP and perform consistently in order to promote return to PLOF.    Baseline in development    Time 3    Period Weeks    Status On-going    Target Date 01/06/21  PT SHORT TERM GOAL #2   Title Patient will demonstrate bed mobility with Supervision to  decrease level of assistance from caregivers    Baseline mod A    Time 3    Period Weeks    Status Achieved    Target Date 01/06/21      PT SHORT TERM GOAL #3   Title Patient will demonstrate transfers with supervision to decrease level of care    Baseline Min A for sit to stand    Time 3    Period Weeks    Status Achieved    Target Date 01/06/21      PT SHORT TERM GOAL #4   Title Patient will demonstrate improved ambulation tolerance as evidenced by distacne of 200 ft during 2MWT using least restrictive AD    Baseline --    Time 3    Period Weeks    Status Achieved    Target Date 01/06/21      PT SHORT TERM GOAL #5   Title Patient will be able to get socks and shoes on without assist to return to optimal funciton.    Time 2    Period Weeks    Status New    Target Date 01/26/21             PT Long Term Goals - 01/12/21 1332      PT LONG TERM GOAL #1   Title Patient will demonstrate 4/5 RLE to improve gait mechanics    Baseline --    Time 6    Period Weeks    Status Achieved      PT LONG TERM GOAL #2   Title Patient will improve on FOTO score to meet predicted outcomes to improve functional independence    Baseline 46% function    Time 6    Period Weeks    Status Achieved                 Plan - 01/21/21 1548    Clinical Impression Statement Pt progressing well toward POC.  Pt able  to demonstrate increased Lt LE weight bearing without UE support during toe tapping and tandem stance on 6in step height, gait belt and SBA for safety.  Sidestep and retro gait complete wihtout AD.  Improved activity tolerance with no rest breaks needed this session.  Pt continues to be limited by pain with Lt LE weight bearing with reports of increased anterior hip pain at EOS.    Personal Factors and Comorbidities Age;Comorbidity 3+;Fitness    Comorbidities see PMH    Examination-Activity Limitations Bathing;Bed Mobility;Bend;Carry;Dressing;Lift;Toileting;Stand;Stairs;Squat;Locomotion Level;Transfers    Examination-Participation Restrictions Cleaning;Community Activity;Driving;Laundry;Yard Work;Shop;Occupation    Stability/Clinical Decision Making Stable/Uncomplicated    Clinical Decision Making Low    Rehab Potential Excellent    PT Frequency 3x / week    PT Duration 6 weeks    PT Treatment/Interventions ADLs/Self Care Home Management;Aquatic Therapy;Biofeedback;Cryotherapy;Electrical Stimulation;DME Instruction;Ultrasound;Moist Heat;Gait training;Stair training;Functional mobility training;Therapeutic activities;Therapeutic exercise;Balance training;Patient/family education;Neuromuscular re-education;Manual techniques;Passive range of motion;Taping;Energy conservation;Dry needling;Spinal Manipulations;Joint Manipulations    PT Next Visit Plan Continue with hip ROM/mobility and static/dynamic balance    PT Home Exercise Plan quad sets, heel slides, ambulation with RW; 12/27 heel slides, hip abd/add/extension isometrics; 12/31/20: bridge and supine hip flexion. 1/10: standing hip abd, lateral walking with UE support.; 01/19/21: squat with UE support and chair behing    Consulted and Agree with Plan of Care Patient;Family member/caregiver    Family Member Consulted wife  Patient will benefit from skilled therapeutic intervention in order to improve the following deficits and impairments:   Abnormal gait,Decreased activity tolerance,Decreased balance,Decreased mobility,Decreased knowledge of use of DME,Decreased knowledge of precautions,Decreased endurance,Decreased coordination,Decreased range of motion,Decreased safety awareness,Decreased strength,Difficulty walking,Impaired perceived functional ability,Improper body mechanics,Pain  Visit Diagnosis: Pain in left leg  Difficulty in walking, not elsewhere classified  Muscle weakness (generalized)     Problem List Patient Active Problem List   Diagnosis Date Noted  . 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  . Encounter for pre-operative respiratory clearance 11/16/2020  . 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 COPD with chronic bronchitis and emphysema ([place]) 09/22/2020  . Penile pain 04/07/2020  . Balanitis 04/07/2020  . Cervical myelopathy ([place]) 04/03/2017  . Thoracic ascending aortic aneurysm ([place]) 05/09/2016  . Thoracic aortic aneurysm without rupture ([place]) 01/17/2016  . Paroxysmal atrial fibrillation ([place]) 12/17/2015  . S/P partial thyroidectomy 01/20/2015   Ihor Austin, LPTA/CLT; CBIS [phone]' Aldona Lento 01/21/2021, 3:51 PM  [place] [address] [place], [place], [address] Phone: [phone]   Fax:  [phone]  Name: Kyle Owen MRN: [id number] Date of Birth: [birth year]

## 2021-01-24 ENCOUNTER — Ambulatory Visit (HOSPITAL_COMMUNITY): Payer: Medicare Other | Admitting: Physical Therapy

## 2021-01-24 ENCOUNTER — Other Ambulatory Visit: Payer: Self-pay

## 2021-01-24 ENCOUNTER — Encounter (HOSPITAL_COMMUNITY): Payer: Self-pay | Admitting: Physical Therapy

## 2021-01-24 DIAGNOSIS — M6281 Muscle weakness (generalized): Secondary | ICD-10-CM

## 2021-01-24 DIAGNOSIS — M79605 Pain in left leg: Secondary | ICD-10-CM

## 2021-01-24 DIAGNOSIS — R262 Difficulty in walking, not elsewhere classified: Secondary | ICD-10-CM

## 2021-01-24 NOTE — Therapy (Signed)
[place], [place], [address] Phone: [phone]   Fax:  [phone]  Physical Therapy Treatment  Patient Details  Name: Kyle Owen MRN: [id number] Date of Birth: 08-29-1941 Referring Provider (PT): Dr. [name]   Encounter Date: 01/24/2021   PT End of Session - 01/24/21 1045    Visit Number 15    Number of Visits 18    Date for PT Re-Evaluation 01/27/21    Authorization Type Medicare, 63 combined visits PT/OT/ST    Authorization - Visit Number 15    Authorization - Number of Visits 60    Progress Note Due on Visit 18    PT Start Time 7341    PT Stop Time 1123    PT Time Calculation (min) 38 min    Equipment Utilized During Treatment Gait belt    Activity Tolerance Patient tolerated treatment well    Behavior During Therapy WFL for tasks assessed/performed           Past Medical History:  Diagnosis Date  . Arthritis    DJD right knee  . Benign prostatic hypertrophy with urinary frequency   . Bladder cancer ([place])   . Complication of anesthesia    gets combative in recovery  . COPD with emphysema ([place])   . Depression   . Dyspnea    when walking  . Dysrhythmia    a fib  . History of colon polyps   . History of kidney stones   . History of loop recorder    loop recorder in place battery is dead has not had changed due to covid  . History of pneumonia    hx Recurrent -- last bout Dec 2016 CAP-- per pt resolved  . 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 Hyperlipidemia   . Hypertension   . 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 Nephrolithiasis    right non-obstructive  per CT 05-02-2016  . Ocular migraine     controlled w/ verapamil  . PAF (paroxysmal atrial fibrillation) ([place])    followed by AFIB clinic-- cardiologist-- dr Marlou Porch  . Pneumonia    history of pnu.  . Pulmonary nodule, left    left lower lobe x2 per CT 01-20-2016  . Renal cyst, left   . Stroke ([place])    TIA's  . Thoracic aortic aneurysm without rupture ([place])    ascending -- ct chest 01/10/16  4.8cm  . Wears hearing aid    bilateral-- wear at times    Past Surgical History:  Procedure Laterality Date  . ANTERIOR CERVICAL DECOMP/DISCECTOMY FUSION N/A 04/03/2017   Procedure: Cervical three-four, Cervical four-five Anterior cervical decompression/discectomy/fusion;  Surgeon: Erline Levine, MD;  Location: [facility];  Service: Neurosurgery;  Laterality: N/A;  . CATARACT EXTRACTION W/ INTRAOCULAR LENS IMPLANT Right 2016  . CATARACT EXTRACTION W/PHACO  12/16/2012   Procedure: CATARACT EXTRACTION PHACO AND INTRAOCULAR LENS PLACEMENT (IOC);  Surgeon: Tonny Branch, MD;  [facility];  Service: Ophthalmology;  Laterality: Left;  CDE:17.31  . COLONOSCOPY  10/03/2011   Procedure: COLONOSCOPY;  Surgeon: Jamesetta So;  [facility];  Service: Gastroenterology;  Laterality: N/A;  . CYSTOSCOPY/RETROGRADE/URETEROSCOPY/STONE EXTRACTION WITH BASKET Right 03/29/2020   Procedure: CYSTOSCOPY/RETROGRADE/URETEROSCOPY/STONE  EXTRACTION WITH BASKET;  Surgeon: Franchot Gallo, MD;  [facility];  Service: Urology;  Laterality: Right;  1 HR  . DECOMPRESSIOIN ULNAR NERVE AND CUBITAL TUNNEL RELEASE Left 07-14-2009   elbow  . 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 HOLMIUM LASER APPLICATION Right [id number]   Procedure: HOLMIUM LASER APPLICATION;  Surgeon: Franchot Gallo, MD;  [facility];  Service: Urology;  Laterality: Right;  . INGUINAL HERNIA REPAIR Right 1990's  . KNEE ARTHROSCOPY Right 2015  . LEFT CUBITAL TUNNEL RELEASE    . POSTERIOR LUMBAR FUSION  2014   X2  . SHOULDER  ARTHROSCOPY Left 1990's  . TEE WITHOUT CARDIOVERSION N/A 07/27/2015   Procedure: TRANSESOPHAGEAL ECHOCARDIOGRAM (TEE);  Surgeon: Lelon Perla, MD;  Location: [facility] END[facility]COPY;  Service: Cardiovascular;  Laterality: N/A;   normal LV function, ef 55-60%,  mild AR and MR, mild dilated ascending aorta (4.2cm),  mild to moderate atherosclerosis  descending aorta,  mild to moderate TR,  negative saline microcavitation study  . THYROIDECTOMY Right 01/20/2015   Procedure: RIGHT THYROIDECTOMY;  Surgeon: Ascencion Dike, MD;  Location: [facility];  Service: ENT;  Laterality: Right;  . TONSILLECTOMY  as child  . TOTAL HIP ARTHROPLASTY Left 12/14/2020   Procedure: TOTAL HIP ARTHROPLASTY ANTERIOR APPROACH;  Surgeon: [name], MD;  [facility];  Service: Orthopedics;  Laterality: Left;  . TRANSURETHRAL RESECTION OF BLADDER TUMOR N/A 05/15/2016   Procedure: TRANSURETHRAL RESECTION OF BLADDER TUMOR (TURBT) AND INSTILLATION OF EPIRUBICIN;  Surgeon: Franchot Gallo, MD;  Location[facility];  Service: Urology;  Laterality: N/A;  . TRANSURETHRAL RESECTION OF BLADDER TUMOR N/A 03/29/2020   Procedure: TRANSURETHRAL RESECTION OF BLADDER TUMOR (TURBT);  Surgeon: Franchot Gallo, MD;  [facility];  Service: Urology;  Laterality: N/A;    There were no vitals filed for this visit.   Subjective Assessment - 01/24/21 1053    Subjective Pt stated he is feeling good today.  Pain meds taken prior sessoin today.States that overall he feels 75% better, states that he still has some pain and discomfort with walking and moving. States that he feels his balance isn't much better.    Patient is accompained by: Family member    Currently in Pain? Yes    Pain Score 6     Pain Location Hip    Pain Orientation Left    Pain Descriptors / Indicators Burning    Pain Type Surgical pain    Pain Onset More than a month ago    Pain Frequency Intermittent              [facility] PT Assessment - 01/24/21 0001       Assessment   Medical Diagnosis L THR anterior approach    Referring Provider (PT) Dr. [name]    Onset Date/Surgical Date 12/14/20    Next MD Visit 01/26/21                         [facility] Adult PT Treatment/Exercise - 01/24/21 0001      Ambulation/Gait   Ambulation/Gait Yes    Ambulation/Gait Assistance 5: Supervision    Ambulation Distance (Feet) 226 Feet    Assistive device Rolling walker    Gait Pattern Decreased weight shift to left;Shuffle    Ambulation Surface Level    Gait velocity decreased    Gait Comments 2MW with cane 230 feet  Knee/Hip Exercises: Standing   Hip Abduction Both;10 reps;Knee straight;3 sets    Abduction Limitations BUE Support   cues to not lean to side   Hip Extension Stengthening;Both;3 sets;10 reps;AROM    Other Standing Knee Exercises hamstring curls 3x10 B with upper extremity better                  PT Education - 01/24/21 1101    Education Details in returning to gym and what he can and cannot do. importance of HEP adherence. and imoprtance of walking daily.    Person(s) Educated Patient    Methods Explanation    Comprehension Verbalized understanding            PT Short Term Goals - 01/24/21 1055      PT SHORT TERM GOAL #1   Title Pt will be independent with HEP and perform consistently in order to promote return to PLOF.    Baseline in development    Time 3    Period Weeks    Status On-going    Target Date 01/06/21      PT SHORT TERM GOAL #2   Title Patient will demonstrate bed mobility with Supervision to  decrease level of assistance from caregivers    Baseline mod A    Time 3    Period Weeks    Status Achieved    Target Date 01/06/21      PT SHORT TERM GOAL #3   Title Patient will demonstrate transfers with supervision to decrease level of care    Baseline Min A for sit to stand    Time 3    Period Weeks    Status Achieved    Target Date 01/06/21      PT SHORT TERM GOAL #4   Title  Patient will demonstrate improved ambulation tolerance as evidenced by distacne of 200 ft during 2MWT using least restrictive AD    Time 3    Period Weeks    Status Achieved    Target Date 01/06/21      PT SHORT TERM GOAL #5   Title Patient will be able to get socks and shoes on without assist to return to optimal funciton.    Baseline can get normal socks and shoes on but can't get compression socks on (couldn't do prior to surgery)    Time 2    Period Weeks    Status Achieved    Target Date 01/26/21             PT Long Term Goals - 01/12/21 1332      PT LONG TERM GOAL #1   Title Patient will demonstrate 4/5 RLE to improve gait mechanics    Baseline --    Time 6    Period Weeks    Status Achieved      PT LONG TERM GOAL #2   Title Patient will improve on FOTO score to meet predicted outcomes to improve functional independence    Baseline 46% function    Time 6    Period Weeks    Status Achieved                 Plan - 01/24/21 1045    Clinical Impression Statement Patient making good overall progress with goals. Answered all questions and session focused on education. Discussed reviewing HEP prior to next session prior to anticipated discharge. Continued with LE strengthening. Will continue with current POC.    Personal Factors and Comorbidities  Age;Comorbidity 3+;Fitness    Comorbidities see PMH    Examination-Activity Limitations Bathing;Bed Mobility;Bend;Carry;Dressing;Lift;Toileting;Stand;Stairs;Squat;Locomotion Level;Transfers    Examination-Participation Restrictions Cleaning;Community Activity;Driving;Laundry;Yard Work;Shop;Occupation    Stability/Clinical Decision Making Stable/Uncomplicated    Rehab Potential Excellent    PT Frequency 3x / week    PT Duration 6 weeks    PT Treatment/Interventions ADLs/Self Care Home Management;Aquatic Therapy;Biofeedback;Cryotherapy;Electrical Stimulation;DME Instruction;Ultrasound;Moist Heat;Gait training;Stair  training;Functional mobility training;Therapeutic activities;Therapeutic exercise;Balance training;Patient/family education;Neuromuscular re-education;Manual techniques;Passive range of motion;Taping;Energy conservation;Dry needling;Spinal Manipulations;Joint Manipulations    PT Next Visit Plan review/update HEP - anticipate DC today    PT Home Exercise Plan quad sets, heel slides, ambulation with RW; 12/27 heel slides, hip abd/add/extension isometrics; 12/31/20: bridge and supine hip flexion. 1/10: standing hip abd, lateral walking with UE support.; 01/19/21: squat with UE support and chair behing    Consulted and Agree with Plan of Care Patient;Family member/caregiver    Family Member Consulted wife           Patient will benefit from skilled therapeutic intervention in order to improve the following deficits and impairments:  Abnormal gait,Decreased activity tolerance,Decreased balance,Decreased mobility,Decreased knowledge of use of DME,Decreased knowledge of precautions,Decreased endurance,Decreased coordination,Decreased range of motion,Decreased safety awareness,Decreased strength,Difficulty walking,Impaired perceived functional ability,Improper body mechanics,Pain  Visit Diagnosis: Pain in left leg  Difficulty in walking, not elsewhere classified  Muscle weakness (generalized)     Problem List Patient Active Problem List   Diagnosis Date Noted  . 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  . Encounter for pre-operative respiratory clearance 11/16/2020  . 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 COPD with chronic bronchitis and emphysema ([place]) 09/22/2020  . Penile pain 04/07/2020  . Balanitis 04/07/2020  . Cervical myelopathy ([place]) 04/03/2017  . Thoracic ascending aortic aneurysm ([place]) 05/09/2016  . Thoracic aortic aneurysm without rupture ([place]) 01/17/2016  . Paroxysmal atrial fibrillation ([place]) 12/17/2015  . S/P partial thyroidectomy 01/20/2015   11:27  AM, 01/24/21 Jerene Pitch, DPT Physical Therapy with [facility]  [phone] office  [place] [address] [place], [place], [address] Phone: [phone]   Fax:  [phone]  Name: Kyle Owen MRN: [id number] Date of Birth: [birth year]

## 2021-01-26 ENCOUNTER — Other Ambulatory Visit: Payer: Self-pay

## 2021-01-26 ENCOUNTER — Ambulatory Visit (HOSPITAL_COMMUNITY): Payer: Medicare Other | Attending: Orthopedic Surgery

## 2021-01-26 ENCOUNTER — Encounter (HOSPITAL_COMMUNITY): Payer: Self-pay

## 2021-01-26 DIAGNOSIS — M6281 Muscle weakness (generalized): Secondary | ICD-10-CM | POA: Insufficient documentation

## 2021-01-26 DIAGNOSIS — M79605 Pain in left leg: Secondary | ICD-10-CM | POA: Insufficient documentation

## 2021-01-26 DIAGNOSIS — R262 Difficulty in walking, not elsewhere classified: Secondary | ICD-10-CM | POA: Diagnosis present

## 2021-01-26 NOTE — Therapy (Addendum)
[facility] [address] [place], [place], [address] Phone: [phone]   Fax:  [phone]  Physical Therapy Treatment and Discharge Note  Patient Details  Name: Kyle Owen MRN: [id number] Date of Birth: 1941/02/10 Referring Provider (PT): Dr. [name]  PHYSICAL THERAPY DISCHARGE SUMMARY  Visits from Start of Care: 16  Current functional level related to goals / functional outcomes: See below    Remaining deficits: See below   Education / Equipment: See below  Plan: Patient agrees to discharge.  Patient goals were partially met. Patient is being discharged due to being pleased with the current functional level.  ?????          4:30 PM, 02/24/21 Jerene Pitch, DPT Physical Therapy with [facility]  [phone] office   Encounter Date: 01/26/2021   PT End of Session - 01/26/21 1143    Visit Number 16    Number of Visits 18    Date for PT Re-Evaluation 01/27/21    Authorization Type Medicare, 23 combined visits PT/OT/ST    Authorization - Visit Number 42    Authorization - Number of Visits 60    Progress Note Due on Visit 18    PT Start Time 1048    PT Stop Time 1135    PT Time Calculation (min) 47 min    Activity Tolerance Patient tolerated treatment well    Behavior During Therapy WFL for tasks assessed/performed           Past Medical History:  Diagnosis Date  . Arthritis    DJD right knee  . Benign prostatic hypertrophy with urinary frequency   . Bladder cancer ([place])   . Complication of anesthesia    gets combative in recovery  . COPD with emphysema ([place])   . Depression   . Dyspnea    when walking  . Dysrhythmia    a fib  . History of colon polyps   . History of kidney stones   . History of loop recorder    loop recorder in place battery is dead has not had changed due to covid  . History of pneumonia    hx Recurrent -- last bout Dec 2016 CAP-- per pt resolved  .  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 Hyperlipidemia   . Hypertension   . 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 Nephrolithiasis    right non-obstructive  per CT 05-02-2016  . Ocular migraine    controlled w/ verapamil  . PAF (paroxysmal atrial fibrillation) ([place])    followed by AFIB clinic-- cardiologist-- dr Marlou Porch  . Pneumonia    history of pnu.  . Pulmonary nodule, left    left lower lobe x2 per CT 01-20-2016  . Renal cyst, left   . Stroke ([place])    TIA's  . Thoracic aortic aneurysm without rupture ([place])    ascending -- ct chest 01/10/16  4.8cm  . Wears hearing aid    bilateral-- wear at times    Past Surgical History:  Procedure Laterality Date  . ANTERIOR CERVICAL DECOMP/DISCECTOMY FUSION N/A 04/03/2017   Procedure: Cervical three-four, Cervical four-five Anterior cervical decompression/discectomy/fusion;  Surgeon: Erline Levine, MD;  Location: [facility];  Service: Neurosurgery;  Laterality: N/A;  . CATARACT EXTRACTION W/ INTRAOCULAR LENS IMPLANT Right 2016  . CATARACT EXTRACTION W/PHACO  12/16/2012   Procedure: CATARACT EXTRACTION PHACO AND INTRAOCULAR LENS PLACEMENT (IOC);  Surgeon: Tonny Branch, MD;  Location: [facility];  Service: Ophthalmology;  Laterality: Left;  CDE:17.31  . COLONOSCOPY  10/03/2011   Procedure: COLONOSCOPY;  Surgeon: Jamesetta So;  [facility];  Service: Gastroenterology;  Laterality: N/A;  . CYSTOSCOPY/RETROGRADE/URETEROSCOPY/STONE EXTRACTION WITH BASKET Right 03/29/2020   Procedure: CYSTOSCOPY/RETROGRADE/URETEROSCOPY/STONE EXTRACTION WITH BASKET;  Surgeon: Franchot Gallo, MD;  Location: [facility];  Service: Urology;  Laterality: Right;  1 HR  . DECOMPRESSIOIN ULNAR  NERVE AND CUBITAL TUNNEL RELEASE Left 07-14-2009   elbow  . 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 HOLMIUM LASER APPLICATION Right 01/29/5808   Procedure: HOLMIUM LASER APPLICATION;  Surgeon: Franchot Gallo, MD;  [facility];  Service: Urology;  Laterality: Right;  . INGUINAL HERNIA REPAIR Right 1990's  . KNEE ARTHROSCOPY Right 2015  . LEFT CUBITAL TUNNEL RELEASE    . POSTERIOR LUMBAR FUSION  2014   X2  . SHOULDER ARTHROSCOPY Left 1990's  . TEE WITHOUT CARDIOVERSION N/A 07/27/2015   Procedure: TRANSESOPHAGEAL ECHOCARDIOGRAM (TEE);  Surgeon: Lelon Perla, MD;  Location: [facility];  Service: Cardiovascular;  Laterality: N/A;   normal LV function, ef 55-60%,  mild AR and MR, mild dilated ascending aorta (4.2cm),  mild to moderate atherosclerosis  descending aorta,  mild to moderate TR,  negative saline microcavitation study  . THYROIDECTOMY Right 01/20/2015   Procedure: RIGHT THYROIDECTOMY;  Surgeon: Ascencion Dike, MD;  Location: [facility];  Service: ENT;  Laterality: Right;  . TONSILLECTOMY  as child  . TOTAL HIP ARTHROPLASTY Left 12/14/2020   Procedure: TOTAL HIP ARTHROPLASTY ANTERIOR APPROACH;  Surgeon: [name], MD;  [facility];  Service: Orthopedics;  Laterality: Left;  . TRANSURETHRAL RESECTION OF BLADDER TUMOR N/A 05/15/2016   Procedure: TRANSURETHRAL RESECTION OF BLADDER TUMOR (TURBT) AND INSTILLATION OF EPIRUBICIN;  Surgeon: Franchot Gallo, MD;  Location[facility];  Service: Urology;  Laterality: N/A;  . TRANSURETHRAL RESECTION OF BLADDER TUMOR N/A 03/29/2020   Procedure: TRANSURETHRAL RESECTION OF BLADDER TUMOR (TURBT);  Surgeon: Franchot Gallo, MD;  [facility];  Service: Urology;  Laterality: N/A;    There were no vitals filed for this visit.   Subjective Assessment - 01/26/21 1053    Subjective Pt stated morning are always rough, improves later in the day.    Currently in Pain?  Yes    Pain Score 6     Pain Location Hip    Pain Orientation Left    Pain Descriptors / Indicators Burning;Sore;Aching    Pain Type Surgical pain    Pain Onset More than a month ago    Pain Frequency Intermittent    Aggravating Factors  moving    Pain Relieving Factors prednizone                    [facility] Adult PT Treatment/Exercise - 01/26/21 0001      Knee/Hip Exercises: Machines for Strengthening   Cybex Knee Extension 1Pl 2x 10    Cybex Knee Flexion 4Pl 2x 10    Cybex Leg Press 4pl 2x 10    Total Gym Leg Press --      Knee/Hip Exercises: Standing   Hip Flexion Both;15 reps    Hip Flexion Limitations Toe tapping 6in  alternating minimal to no hand use    Functional Squat 10 reps    Functional Squat Limitations UE support on counter    Other Standing Knee Exercises tandem stance by counter 2x 30"    Other Standing Knee Exercises sidestep GTB around thigh 1RT down long hallway.                    PT Short Term Goals - 01/24/21 1055      PT SHORT TERM GOAL #1   Title Pt will be independent with HEP and perform consistently in order to promote return to PLOF.    Baseline in development    Time 3    Period Weeks    Status On-going    Target Date 01/06/21      PT SHORT TERM GOAL #2   Title Patient will demonstrate bed mobility with Supervision to  decrease level of assistance from caregivers    Baseline mod A    Time 3    Period Weeks    Status Achieved    Target Date 01/06/21      PT SHORT TERM GOAL #3   Title Patient will demonstrate transfers with supervision to decrease level of care    Baseline Min A for sit to stand    Time 3    Period Weeks    Status Achieved    Target Date 01/06/21      PT SHORT TERM GOAL #4   Title Patient will demonstrate improved ambulation tolerance as evidenced by distacne of 200 ft during 2MWT using least restrictive AD    Time 3    Period Weeks    Status Achieved    Target Date 01/06/21      PT SHORT TERM  GOAL #5   Title Patient will be able to get socks and shoes on without assist to return to optimal funciton.    Baseline can get normal socks and shoes on but can't get compression socks on (couldn't do prior to surgery)    Time 2    Period Weeks    Status Achieved    Target Date 01/26/21             PT Long Term Goals - 01/12/21 1332      PT LONG TERM GOAL #1   Title Patient will demonstrate 4/5 RLE to improve gait mechanics    Baseline --    Time 6    Period Weeks    Status Achieved      PT LONG TERM GOAL #2   Title Patient will improve on FOTO score to meet predicted outcomes to improve functional independence    Baseline 46% function    Time 6    Period Weeks    Status Achieved                 Plan - 01/26/21 1155    Clinical Impression Statement Session focus on advancing exercises for HEP at DC.  Pt reports he plans to return to family fitness gym.  Reviewed mechanics wiht weight machines with cueing for form and encouraged to begin low weight and slow controlled movements, verbalized understanding.  Added theraband with sidestep for additional gluteal resistance for strengthening.  Encouraged to complete all balance based exercises by counter or sink for safety.  Pt reports he continues to have swelling BLE.  Educated to continues wiht compression garments and to get new one every 6 months.  Measurements complete and  paperwork given for [employer] places to purchase butler for increased ease donning to reduce strain on wife.    Personal Factors and Comorbidities Age;Comorbidity 3+;Fitness    Comorbidities see PMH    Examination-Activity Limitations Bathing;Bed Mobility;Bend;Carry;Dressing;Lift;Toileting;Stand;Stairs;Squat;Locomotion Level;Transfers    Examination-Participation Restrictions Cleaning;Community Activity;Driving;Laundry;Yard Work;Shop;Occupation    Stability/Clinical Decision Making Stable/Uncomplicated    Clinical Decision Making  Low    Rehab Potential Excellent    PT Frequency 3x / week    PT Duration 6 weeks    PT Treatment/Interventions ADLs/Self Care Home Management;Aquatic Therapy;Biofeedback;Cryotherapy;Electrical Stimulation;DME Instruction;Ultrasound;Moist Heat;Gait training;Stair training;Functional mobility training;Therapeutic activities;Therapeutic exercise;Balance training;Patient/family education;Neuromuscular re-education;Manual techniques;Passive range of motion;Taping;Energy conservation;Dry needling;Spinal Manipulations;Joint Manipulations    PT Next Visit Plan DC to HEP.    PT Home Exercise Plan quad sets, heel slides, ambulation with RW; 12/27 heel slides, hip abd/add/extension isometrics; 12/31/20: bridge and supine hip flexion. 1/10: standing hip abd, lateral walking with UE support.; 01/19/21: squat with UE support and chair behing    Consulted and Agree with Plan of Care Patient;Family member/caregiver    Family Member Consulted wife           Patient will benefit from skilled therapeutic intervention in order to improve the following deficits and impairments:  Abnormal gait,Decreased activity tolerance,Decreased balance,Decreased mobility,Decreased knowledge of use of DME,Decreased knowledge of precautions,Decreased endurance,Decreased coordination,Decreased range of motion,Decreased safety awareness,Decreased strength,Difficulty walking,Impaired perceived functional ability,Improper body mechanics,Pain  Visit Diagnosis: Difficulty in walking, not elsewhere classified  Muscle weakness (generalized)  Pain in left leg     Problem List Patient Active Problem List   Diagnosis Date Noted  . 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  . Encounter for pre-operative respiratory clearance 11/16/2020  . 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 COPD with chronic bronchitis and emphysema ([place]) 09/22/2020  . Penile pain 04/07/2020  . Balanitis 04/07/2020  . Cervical myelopathy  ([place]) 04/03/2017  . Thoracic ascending aortic aneurysm ([place]) 05/09/2016  . Thoracic aortic aneurysm without rupture ([place]) 01/17/2016  . Paroxysmal atrial fibrillation ([place]) 12/17/2015  . S/P partial thyroidectomy 01/20/2015   Ihor Austin, LPTA/CLT; CBIS [phone]  Aldona Lento 01/26/2021, 1:09 PM  [facility] [address] [place], [place], [address] Phone: [phone]   Fax:  [phone]  Name: Kyle Owen MRN: [id number] Date of Birth: [birth year]

## 2021-01-26 NOTE — Patient Instructions (Addendum)
Band Walk: Side Stepping    Tie band around legs, just above knees. Step 12 feet to one side, then step back to start. Repeat ___ feet per session. Note: Small towel between band and skin eases rubbing.  http://plyo.exer.us/76   Copyright  VHI. All rights reserved.   Functional Quadriceps: Sit to Stand    Sit on edge of chair, feet flat on floor. Stand upright, extending knees fully. Repeat 10 times per set. Do 2 sets per day.  http://orth.exer.us/734   Copyright  VHI. All rights reserved.   Tandem Stance    Right foot in front of left, heel touching toe both feet "straight ahead". Stand on Foot Triangle of Support with both feet.  Balance in this position 30 seconds. Do with left foot in front of right.  Copyright  VHI. All rights reserved.   FUNCTIONAL MOBILITY: Squat    Stance: shoulder-width on floor. Bend hips and knees. Keep back straight. Do not allow knees to bend past toes.  Squeeze glutes and quads to stand. 10 reps per set, 2 sets per day, 4 days per week  Copyright  VHI. All rights reserved.

## 2021-01-28 ENCOUNTER — Encounter (HOSPITAL_COMMUNITY): Payer: Medicare Other

## 2021-02-03 ENCOUNTER — Other Ambulatory Visit: Payer: Self-pay | Admitting: Cardiothoracic Surgery

## 2021-02-03 DIAGNOSIS — I712 Thoracic aortic aneurysm, without rupture, unspecified: Secondary | ICD-10-CM

## 2021-02-03 DIAGNOSIS — I7121 Aneurysm of the ascending aorta, without rupture: Secondary | ICD-10-CM

## 2021-02-08 ENCOUNTER — Other Ambulatory Visit: Payer: Medicare Other | Admitting: Urology

## 2021-03-24 ENCOUNTER — Other Ambulatory Visit: Payer: Medicare Other

## 2021-03-24 ENCOUNTER — Ambulatory Visit: Payer: Medicare Other | Admitting: Cardiothoracic Surgery

## 2021-04-04 ENCOUNTER — Encounter: Payer: Self-pay | Admitting: Pulmonary Disease

## 2021-04-04 ENCOUNTER — Telehealth: Payer: Self-pay | Admitting: Pulmonary Disease

## 2021-04-04 ENCOUNTER — Ambulatory Visit (INDEPENDENT_AMBULATORY_CARE_PROVIDER_SITE_OTHER): Payer: Medicare Other | Admitting: Pulmonary Disease

## 2021-04-04 ENCOUNTER — Other Ambulatory Visit: Payer: Self-pay

## 2021-04-04 VITALS — BP 142/74 | HR 67 | Temp 96.6°F | Ht 73.0 in | Wt 218.0 lb

## 2021-04-04 DIAGNOSIS — J449 Chronic obstructive pulmonary disease, unspecified: Secondary | ICD-10-CM | POA: Diagnosis not present

## 2021-04-04 DIAGNOSIS — R519 Headache, unspecified: Secondary | ICD-10-CM

## 2021-04-04 MED ORDER — BREZTRI AEROSPHERE 160-9-4.8 MCG/ACT IN AERO: 2.0000 | INHALATION_SPRAY | Freq: Two times a day (BID) | RESPIRATORY_TRACT | 5 refills | Status: DC

## 2021-04-04 NOTE — Patient Instructions (Addendum)
Rx will be sent for Breztri - 2 puffs twice daily x 1 month x 5 refills - check with your insurance about cost Check  Noct oximetry

## 2021-04-04 NOTE — Telephone Encounter (Signed)
Kyle Owen from [facility] returned phone call and stated that they had all the necessary paperwork and nothing further was needed at this time. Patient has already come to pick up ONO equipment to do over night tonight, 04/04/2021, and they will fax results to [facility] office since Dr Elsworth Soho is at this office this week. Nothing further needed at this time.

## 2021-04-04 NOTE — Telephone Encounter (Signed)
Called and left message at [facility] to please return phone call. Contact number provided.

## 2021-04-04 NOTE — Progress Notes (Signed)
   Subjective:    Patient ID: Kyle Owen, male    DOB: 06-May-1941, 80 y.o.   MRN: [id number]  HPI  80 yo ex-smoker for follow-up of COPD  PMH -cryptogenic TIA, atrial fibrillation on Eliquis, bladder cancer,thoracic aortic aneurysm  Last OV 10/2020 for pre-op clearance .  Hip surgery was uneventful, he is still ambulating with a cane, was told that it may take him 6 to 9 months to fully recover. He wonders if he needs oxygen. Complains of morning headaches especially at the back of his neck, his daughter sent him a note to ask if this was with hypoxia headaches and code. Accompanied by his wife who corroborates history.  Breztri worked well for him but he wonders if this will be too expensive  Significant tests/ events reviewed   CT angiogram chest 10/2020 -stable 4.7 cm aneurysm CT chest 9/20204.7 cm ascending aortic aneurysm.Stable calcified granuloma right apex   PFTs 10/2020 moderate airway obstruction, ratio 62, FEV1 1.83/58%, FVC 67%, TLC normal, DLCO 22.8/88%  Review of Systems neg for any significant sore throat, dysphagia, itching, sneezing, nasal congestion or excess/ purulent secretions, fever, chills, sweats, unintended wt loss, pleuritic or exertional cp, hempoptysis, orthopnea pnd or change in chronic leg swelling. Also denies presyncope, palpitations, heartburn, abdominal pain, nausea, vomiting, diarrhea or change in bowel or urinary habits, dysuria,hematuria, rash, arthralgias, visual complaints, headache, numbness weakness or ataxia.      Objective:   Physical Exam  Gen. Pleasant, elderly,well-nourished, in no distress ENT - no thrush, no pallor/icterus,no post nasal drip Neck: No JVD, no thyromegaly, no carotid bruits Lungs: no use of accessory muscles, no dullness to percussion, clear without rales or rhonchi  Cardiovascular: Rhythm regular, heart sounds  normal, no murmurs or gallops, no peripheral edema Musculoskeletal: No deformities, no  cyanosis or clubbing         Assessment & Plan:

## 2021-04-04 NOTE — Assessment & Plan Note (Signed)
We will check nocturnal oximetry to evaluate for morning headaches. He does not seem to require oxygen in the daytime

## 2021-04-04 NOTE — Assessment & Plan Note (Signed)
Prescription will be sent for [facility] If this is too expensive he can get back on Symbicort twice daily. Use albuterol only for rescue

## 2021-04-05 ENCOUNTER — Encounter: Payer: Self-pay | Admitting: Pulmonary Disease

## 2021-04-05 ENCOUNTER — Telehealth: Payer: Self-pay | Admitting: Pulmonary Disease

## 2021-04-05 DIAGNOSIS — J449 Chronic obstructive pulmonary disease, unspecified: Secondary | ICD-10-CM

## 2021-04-05 NOTE — Telephone Encounter (Signed)
Called and went over ONO results per Dr Elsworth Soho with patient. All questions answered and patient expressed full understanding to use 2 Liters oxygen during sleep. Patient agreeable to order being placed. Order placed per Dr Elsworth Soho. Patient expressed full understanding to report back to Dr Elsworth Soho in 2 weeks if headaches are doing better. Nothing further needed at this time.

## 2021-04-05 NOTE — Telephone Encounter (Signed)
ONO shows desaturation less than 88% for 3 hours 46 minutes We will provide him oxygen 2 L to use during sleep Have him report back in 2 weeks if headaches are doing better

## 2021-05-02 NOTE — Progress Notes (Incomplete)
History of Present Illness:   4.5.20221-Underwent TURBT/Rt URS/stone mgmt.  Initial TURBT of a 2 cm papillary bladder tumor on 5.22.2017.  Pathology revealed papillary low-grade NMIBC. Epirubicin was placed post resection.  4.5.2021: He underwent cystoscopy, biopsy of papillary lesions at the right ureteral orifice, right ureteroscopy with holmium laser and extraction of stone as well as stent placement.    Past Medical History:  Diagnosis Date  . Arthritis    DJD right knee  . Benign prostatic hypertrophy with urinary frequency   . Bladder cancer ([place])   . Complication of anesthesia    gets combative in recovery  . COPD with emphysema ([place])   . Depression   . Dyspnea    when walking  . Dysrhythmia    a fib  . History of colon polyps   . History of kidney stones   . History of loop recorder    loop recorder in place battery is dead has not had changed due to covid  . History of pneumonia    hx Recurrent -- last bout Dec 2016 CAP-- per pt resolved  . 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 Hyperlipidemia   . Hypertension   . 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 Nephrolithiasis    right non-obstructive  per CT 05-02-2016  . Ocular migraine    controlled w/ verapamil  . PAF (paroxysmal atrial fibrillation) ([place])    followed by AFIB clinic-- cardiologist-- dr Marlou Porch  . Pneumonia    history of pnu.  . Pulmonary nodule, left    left lower lobe x2 per CT 01-20-2016  . Renal cyst, left   . Stroke ([place])    TIA's  . Thoracic aortic aneurysm without rupture ([place])    ascending -- ct chest 01/10/16  4.8cm  . Wears hearing aid    bilateral-- wear at times    Past Surgical History:   Procedure Laterality Date  . ANTERIOR CERVICAL DECOMP/DISCECTOMY FUSION N/A 04/03/2017   Procedure: Cervical three-four, Cervical four-five Anterior cervical decompression/discectomy/fusion;  Surgeon: Erline Levine, MD;  Location: [place];  Service: Neurosurgery;  Laterality: N/A;  . CATARACT EXTRACTION W/ INTRAOCULAR LENS IMPLANT Right 2016  . CATARACT EXTRACTION W/PHACO  12/16/2012   Procedure: CATARACT EXTRACTION PHACO AND INTRAOCULAR LENS PLACEMENT (IOC);  Surgeon: Tonny Branch, MD;  [facility];  Service: Ophthalmology;  Laterality: Left;  CDE:17.31  . COLONOSCOPY  10/03/2011   Procedure: COLONOSCOPY;  Surgeon: Jamesetta So;  [facility];  Service: Gastroenterology;  Laterality: N/A;  . CYSTOSCOPY/RETROGRADE/URETEROSCOPY/STONE EXTRACTION WITH BASKET Right 03/29/2020   Procedure: CYSTOSCOPY/RETROGRADE/URETEROSCOPY/STONE EXTRACTION WITH BASKET;  Surgeon: Franchot Gallo, MD;  [facility];  Service: Urology;  Laterality: Right;  1 HR  . DECOMPRESSIOIN ULNAR NERVE AND CUBITAL TUNNEL RELEASE Left 07-14-2009   elbow  . 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 HOLMIUM LASER APPLICATION Right 01/29/8526   Procedure: HOLMIUM LASER APPLICATION;  Surgeon: Franchot Gallo, MD;  [facility];  Service: Urology;  Laterality: Right;  . INGUINAL HERNIA REPAIR Right 1990's  . KNEE ARTHROSCOPY Right 2015  . LEFT CUBITAL TUNNEL RELEASE    . POSTERIOR LUMBAR FUSION  2014   X2  .  SHOULDER ARTHROSCOPY Left 1990's  . TEE WITHOUT CARDIOVERSION N/A 07/27/2015   Procedure: TRANSESOPHAGEAL ECHOCARDIOGRAM (TEE);  Surgeon: Lelon Perla, MD;  Location: [facility];  Service: Cardiovascular;  Laterality: N/A;   normal LV function, ef 55-60%,  mild AR and MR, mild dilated ascending aorta (4.2cm),  mild to moderate atherosclerosis  descending aorta,  mild to moderate TR,  negative saline microcavitation study  . THYROIDECTOMY Right  01/20/2015   Procedure: RIGHT THYROIDECTOMY;  Surgeon: Ascencion Dike, MD;  Location: [place];  Service: ENT;  Laterality: Right;  . TONSILLECTOMY  as child  . TOTAL HIP ARTHROPLASTY Left 12/14/2020   Procedure: TOTAL HIP ARTHROPLASTY ANTERIOR APPROACH;  Surgeon: [name], MD;  [facility];  Service: Orthopedics;  Laterality: Left;  . TRANSURETHRAL RESECTION OF BLADDER TUMOR N/A 05/15/2016   Procedure: TRANSURETHRAL RESECTION OF BLADDER TUMOR (TURBT) AND INSTILLATION OF EPIRUBICIN;  Surgeon: Franchot Gallo, MD;  Location: [facility];  Service: Urology;  Laterality: N/A;  . TRANSURETHRAL RESECTION OF BLADDER TUMOR N/A 03/29/2020   Procedure: TRANSURETHRAL RESECTION OF BLADDER TUMOR (TURBT);  Surgeon: Franchot Gallo, MD;  [facility];  Service: Urology;  Laterality: N/A;    Home Medications:  Allergies as of 05/03/2021      Reactions   Flomax [tamsulosin Hcl] Nausea And Vomiting, Other (See Comments)   "Pt thought he was dying " DIZZY   Fentanyl    Makes me combative       Medication List       Accurate as of May 02, 2021  9:12 PM. If you have any questions, ask your nurse or doctor.        acetaminophen 500 MG tablet Commonly known as: TYLENOL Take 2 tablets (1,000 mg total) by mouth every 6 (six) hours.   atorvastatin 20 MG tablet Commonly known as: LIPITOR TAKE 1 TABLET BY MOUTH DAILY. What changed: how much to take   SunGard 160-9-4.8 MCG/ACT Aero Generic drug: Budeson-Glycopyrrol-Formoterol Inhale 2 puffs into the lungs in the morning and at bedtime.   budesonide-formoterol 160-4.5 MCG/ACT inhaler Commonly known as: SYMBICORT Inhale 2 puffs into the lungs 2 (two) times daily.   Eliquis 5 MG Tabs tablet Generic drug: apixaban TAKE (1) TABLET BY MOUTH TWICE DAILY. What changed: See the new instructions.   finasteride 5 MG tablet Commonly known as: PROSCAR Take 5 mg by mouth daily.   loratadine 10 MG tablet Commonly known  as: CLARITIN Take 10 mg by mouth daily as needed for allergies.   Magnesium 250 MG Tabs Take 500 mg by mouth daily.   OVER THE COUNTER MEDICATION Apply 1 application topically daily as needed (Joint pain). CBD cream   sertraline 50 MG tablet Commonly known as: ZOLOFT Take 50 mg by mouth every morning.   Ventolin HFA 108 (90 Base) MCG/ACT inhaler Generic drug: albuterol Inhale 2 puffs into the lungs Every 4 hours as needed for wheezing or shortness of breath.   verapamil 240 MG CR tablet Commonly known as: CALAN-SR Take 240 mg by mouth daily.       Allergies:  Allergies  Allergen Reactions  . Flomax [Tamsulosin Hcl] Nausea And Vomiting and Other (See Comments)    "Pt thought he was dying " DIZZY  . Fentanyl     Makes me combative     Family History  Problem Relation Age of Onset  . Stroke Sister   . Breast cancer Sister   . Stroke Brother  Social History:  reports that he quit smoking about 10 years ago. His smoking use included cigarettes. He has a 45.00 pack-year smoking history. He has never used smokeless tobacco. He reports that he does not drink alcohol and does not use drugs.  ROS: A complete review of systems was performed.  All systems are negative except for pertinent findings as noted.  Physical Exam:  Vital signs in last 24 hours: There were no vitals taken for this visit. Constitutional:  Alert and oriented, No acute distress Cardiovascular: Regular rate  Respiratory: Normal respiratory effort GI: Abdomen is soft, nontender, nondistended, no abdominal masses. No CVAT.  Genitourinary: Normal male phallus, testes are descended bilaterally and non-tender and without masses, scrotum is normal in appearance without lesions or masses, perineum is normal on inspection. Lymphatic: No lymphadenopathy Neurologic: Grossly intact, no focal deficits Psychiatric: Normal mood and affect  Laboratory Data:  No results for input(s): WBC, HGB, HCT, PLT in the  last 72 hours.  No results for input(s): NA, K, CL, GLUCOSE, BUN, CALCIUM, CREATININE in the last 72 hours.  Invalid input(s): CO3   No results found for this or any previous visit (from the past 24 hour(s)). No results found for this or any previous visit (from the past 240 hour(s)).  Renal Function: No results for input(s): CREATININE in the last 168 hours. CrCl cannot be calculated ([name] most recent lab result is older than the maximum 21 days allowed.).  Radiologic Imaging: No results found.  Impression/Assessment:  ***  Plan:  ***

## 2021-05-03 ENCOUNTER — Ambulatory Visit: Payer: Medicare Other | Admitting: Urology

## 2021-05-03 DIAGNOSIS — N200 Calculus of kidney: Secondary | ICD-10-CM

## 2021-05-03 DIAGNOSIS — C678 Malignant neoplasm of overlapping sites of bladder: Secondary | ICD-10-CM

## 2021-06-01 ENCOUNTER — Other Ambulatory Visit: Payer: Self-pay

## 2021-06-01 ENCOUNTER — Ambulatory Visit (HOSPITAL_COMMUNITY): Payer: Medicare Other | Attending: Orthopedic Surgery | Admitting: Physical Therapy

## 2021-06-01 ENCOUNTER — Encounter (HOSPITAL_COMMUNITY): Payer: Self-pay | Admitting: Physical Therapy

## 2021-06-01 DIAGNOSIS — M25552 Pain in left hip: Secondary | ICD-10-CM

## 2021-06-01 DIAGNOSIS — R262 Difficulty in walking, not elsewhere classified: Secondary | ICD-10-CM | POA: Diagnosis present

## 2021-06-01 DIAGNOSIS — M6281 Muscle weakness (generalized): Secondary | ICD-10-CM | POA: Insufficient documentation

## 2021-06-01 DIAGNOSIS — M79605 Pain in left leg: Secondary | ICD-10-CM | POA: Insufficient documentation

## 2021-06-01 NOTE — Therapy (Signed)
[facility] [address] [place], [place], [address] Phone: [phone]   Fax:  [phone]  Physical Therapy Evaluation  Patient Details  Name: Kyle Owen MRN: [id number] Date of Birth: 04-28-1941 Referring Provider (PT): [name] MD   Encounter Date: 06/01/2021   PT End of Session - 06/01/21 1034    Visit Number 1    Number of Visits 12    Date for PT Re-Evaluation 07/13/21    Authorization Type [employer] Medicare    Progress Note Due on Visit 10    PT Start Time 0950    PT Stop Time 1035    PT Time Calculation (min) 45 min    Equipment Utilized During Treatment Gait belt    Activity Tolerance Patient limited by fatigue;Patient tolerated treatment well    Behavior During Therapy [facility] for tasks assessed/performed           Past Medical History:  Diagnosis Date  . Arthritis    DJD right knee  . Benign prostatic hypertrophy with urinary frequency   . Bladder cancer ([place])   . Complication of anesthesia    gets combative in recovery  . COPD with emphysema ([place])   . Depression   . Dyspnea    when walking  . Dysrhythmia    a fib  . History of colon polyps   . History of kidney stones   . History of loop recorder    loop recorder in place battery is dead has not had changed due to covid  . History of pneumonia    hx Recurrent -- last bout Dec 2016 CAP-- per pt resolved  . 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 Hyperlipidemia   . Hypertension   . 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 Nephrolithiasis    right non-obstructive  per CT 05-02-2016  . Ocular migraine    controlled w/ verapamil  . PAF (paroxysmal atrial fibrillation) ([place])     followed by AFIB clinic-- cardiologist-- dr Marlou Porch  . Pneumonia    history of pnu.  . Pulmonary nodule, left    left lower lobe x2 per CT 01-20-2016  . Renal cyst, left   . Stroke ([place])    TIA's  . Thoracic aortic aneurysm without rupture ([place])    ascending -- ct chest 01/10/16  4.8cm  . Wears hearing aid    bilateral-- wear at times    Past Surgical History:  Procedure Laterality Date  . ANTERIOR CERVICAL DECOMP/DISCECTOMY FUSION N/A 04/03/2017   Procedure: Cervical three-four, Cervical four-five Anterior cervical decompression/discectomy/fusion;  Surgeon: Erline Levine, MD;  Location: [place];  Service: Neurosurgery;  Laterality: N/A;  . CATARACT EXTRACTION W/ INTRAOCULAR LENS IMPLANT Right 2016  . CATARACT EXTRACTION W/PHACO  12/16/2012   Procedure: CATARACT EXTRACTION PHACO AND INTRAOCULAR LENS PLACEMENT (IOC);  Surgeon: Tonny Branch, MD;  [facility];  Service: Ophthalmology;  Laterality: Left;  CDE:17.31  . COLONOSCOPY  10/03/2011   Procedure: COLONOSCOPY;  Surgeon: Jamesetta So;  [facility];  Service: Gastroenterology;  Laterality: N/A;  . CYSTOSCOPY/RETROGRADE/URETEROSCOPY/STONE EXTRACTION WITH BASKET Right 03/29/2020   Procedure: CYSTOSCOPY/RETROGRADE/URETEROSCOPY/STONE EXTRACTION WITH BASKET;  Surgeon: Franchot Gallo, MD;  [facility];  Service: Urology;  Laterality:  Right;  1 HR  . DECOMPRESSIOIN ULNAR NERVE AND CUBITAL TUNNEL RELEASE Left 07-14-2009   elbow  . 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 HOLMIUM LASER APPLICATION Right 01/30/2034   Procedure: HOLMIUM LASER APPLICATION;  Surgeon: Franchot Gallo, MD;  [facility];  Service: Urology;  Laterality: Right;  . INGUINAL HERNIA REPAIR Right 1990's  . KNEE ARTHROSCOPY Right 2015  . LEFT CUBITAL TUNNEL RELEASE    . POSTERIOR LUMBAR FUSION  2014   X2  . SHOULDER ARTHROSCOPY Left 1990's  . TEE WITHOUT CARDIOVERSION N/A 07/27/2015    Procedure: TRANSESOPHAGEAL ECHOCARDIOGRAM (TEE);  Surgeon: Lelon Perla, MD;  Location: [facility];  Service: Cardiovascular;  Laterality: N/A;   normal LV function, ef 55-60%,  mild AR and MR, mild dilated ascending aorta (4.2cm),  mild to moderate atherosclerosis  descending aorta,  mild to moderate TR,  negative saline microcavitation study  . THYROIDECTOMY Right 01/20/2015   Procedure: RIGHT THYROIDECTOMY;  Surgeon: Ascencion Dike, MD;  Location: [place];  Service: ENT;  Laterality: Right;  . TONSILLECTOMY  as child  . TOTAL HIP ARTHROPLASTY Left 12/14/2020   Procedure: TOTAL HIP ARTHROPLASTY ANTERIOR APPROACH;  Surgeon: [name], MD;  [facility];  Service: Orthopedics;  Laterality: Left;  . TRANSURETHRAL RESECTION OF BLADDER TUMOR N/A 05/15/2016   Procedure: TRANSURETHRAL RESECTION OF BLADDER TUMOR (TURBT) AND INSTILLATION OF EPIRUBICIN;  Surgeon: Franchot Gallo, MD;  Location[facility];  Service: Urology;  Laterality: N/A;  . TRANSURETHRAL RESECTION OF BLADDER TUMOR N/A 03/29/2020   Procedure: TRANSURETHRAL RESECTION OF BLADDER TUMOR (TURBT);  Surgeon: Franchot Gallo, MD;  [facility];  Service: Urology;  Laterality: N/A;    There were no vitals filed for this visit.    Subjective Assessment - 06/01/21 0958    Subjective Patient presents to therapy with complaint of LT hip pain. He says he can't walk right. HE reports having THA on 12/14/20. He says he had therapy and did ok. He reports having 2 weeks where he could walk alright. He says he continued exercise at home but his LT hip pain returned and he had decline in his ability to walk.    Pertinent History LT THA, lumbar fusion, cervical fusion    Limitations Standing;Walking;House hold activities    Patient Stated Goals Be able to walk without pain/ cane    Currently in Pain? Yes    Pain Score 6     Pain Location Hip    Pain Orientation Left    Pain Descriptors / Indicators Burning;Stabbing     Pain Type Chronic pain    Pain Onset More than a month ago    Pain Frequency Constant    Aggravating Factors  pressure to LT hip, standing, walking, sitting    Pain Relieving Factors laying down    Effect of Pain on Daily Activities Limits              [facility] PT Assessment - 06/01/21 0001      Assessment   Medical Diagnosis Lumbago s/p LT THA    Referring Provider (PT) [name] MD    Onset Date/Surgical Date 12/14/20    Prior Therapy Yes      Precautions   Precautions Fall      Restrictions   Weight Bearing Restrictions No      Balance Screen   Has the patient fallen in the past 6  months Yes    How many times? 3    Has the patient had a decrease in activity level because of a fear of falling?  Yes    Is the patient reluctant to leave their home because of a fear of falling?  No      Home Environment   Living Environment Private residence    Living Arrangements Spouse/significant other    Available Help at Discharge Family      Prior Function   Level of Independence Needs assistance with ADLs      Cognition   Overall Cognitive Status Within Functional Limits for tasks assessed      Observation/Other Assessments   Focus on Therapeutic Outcomes (FOTO)  28% function      ROM / Strength   AROM / PROM / Strength Strength      Strength   Strength Assessment Site Hip;Knee;Ankle    Right/Left Hip Right;Left    Right Hip Flexion 5/5    Left Hip Flexion 4/5    Right/Left Knee Right;Left    Right Knee Extension 4+/5    Left Knee Extension 3+/5    Right/Left Ankle Right;Left    Right Ankle Dorsiflexion 4+/5    Left Ankle Dorsiflexion 5/5      Transfers   Five time sit to stand comments  30 seconds with use of UE      Ambulation/Gait   Ambulation/Gait Yes    Ambulation/Gait Assistance 5: Supervision;4: Min guard    Ambulation Distance (Feet) 190 Feet    Assistive device Straight cane    Gait Pattern Decreased arm swing - left;Decreased step length -  right;Decreased step length - left;Decreased stride length;Narrow base of support    Ambulation Surface Level;Indoor    Gait velocity decreased    Gait Comments 2MWT                      Objective measurements completed on examination: See above findings.               PT Education - 06/01/21 1003    Education Details on evaluation findings, and POC    Person(s) Educated Patient;Spouse    Methods Explanation    Comprehension Verbalized understanding            PT Short Term Goals - 06/01/21 1224      PT SHORT TERM GOAL #1   Title Patient will be independent with initial HEP and self-management strategies to improve functional outcomes    Time 3    Period Weeks    Status New    Target Date 06/22/21             PT Long Term Goals - 06/01/21 1628      PT LONG TERM GOAL #1   Title Patient will improve FOTO score to predicted value to indicate improvement in functional outcomes    Time 6    Period Weeks    Status New    Target Date 07/13/21      PT LONG TERM GOAL #2   Title Patient will report at least 60% overall improvement in subjective complaint to indicate improvement in ability to perform ADLs.    Time 6    Period Weeks    Status New    Target Date 07/13/21      PT LONG TERM GOAL #3   Title Patient will be able to ambulate at least 300 feet during 2MWT with LRAD to  demonstrate improved ability to perform functional mobility and associated tasks.    Time 6    Period Weeks    Status New    Target Date 07/13/21      PT LONG TERM GOAL #4   Title Patient will be able to perform stand x 5 in < 15 seconds to demonstrate improvement in functional mobility and reduced risk for falls.    Time 6    Period Weeks    Status New    Target Date 07/13/21                  Plan - 06/01/21 1037    Clinical Impression Statement Patient is a 80 y.o. male who presents to physical therapy with complaint of LT hip pain/ gait disturbance.  Patient demonstrates decreased strength, ROM restriction, balance deficits and gait abnormalities which are likely contributing to symptoms of pain and are negatively impacting patient ability to perform ADLs and functional mobility tasks. Patient will benefit from skilled physical therapy services to address these deficits to reduce pain, improve level of function with ADLs, functional mobility tasks, and reduce risk for falls.    Personal Factors and Comorbidities Comorbidity 3+    Examination-Activity Limitations Bed Mobility;Sit;Stairs;Stand;Transfers;Locomotion Level    Examination-Participation Restrictions Yard Work;Community Activity    Stability/Clinical Decision Making Stable/Uncomplicated    Clinical Decision Making Low    Rehab Potential Fair    PT Frequency 2x / week    PT Duration 6 weeks    PT Treatment/Interventions ADLs/Self Care Home Management;Aquatic Therapy;Biofeedback;Cryotherapy;Electrical Stimulation;Therapeutic exercise;Orthotic Fit/Training;Compression bandaging;Scar mobilization;Balance training;Contrast Bath;DME Instruction;Passive range of motion;Visual/perceptual remediation/compensation;Vasopneumatic Device;Taping;Splinting;Manual techniques;Patient/family education;Functional mobility training;Therapeutic activities;Ultrasound;Parrafin;Fluidtherapy;Moist Heat;Iontophoresis 4mg /ml Dexamethasone;Stair training;Gait training;Neuromuscular re-education;Dry needling;Energy conservation;Spinal Manipulations;Joint Manipulations    PT Next Visit Plan Review goals. Begin with hip and core strengthening and progress as tolerated. Add ab set, ab march, glute set, SLR. Progress to standing balance and gait when appropriate. Manuals as needed    PT Home Exercise Plan Issue next visit    Consulted and Agree with Plan of Care Patient;Family member/caregiver    Family Member Consulted Wife           Patient will benefit from skilled therapeutic intervention in order to improve the  following deficits and impairments:  Increased fascial restricitons,Decreased range of motion,Abnormal gait,Decreased mobility,Decreased strength,Decreased balance,Decreased activity tolerance,Impaired perceived functional ability,Pain,Difficulty walking,Improper body mechanics,Impaired flexibility  Visit Diagnosis: Difficulty in walking, not elsewhere classified  Muscle weakness (generalized)  Pain in left hip     Problem List Patient Active Problem List   Diagnosis Date Noted  . Morning headache 04/04/2021  . 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  . 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 COPD with chronic bronchitis and emphysema ([place]) 09/22/2020  . Penile pain 04/07/2020  . Balanitis 04/07/2020  . Cervical myelopathy ([place]) 04/03/2017  . Thoracic ascending aortic aneurysm ([place]) 05/09/2016  . Thoracic aortic aneurysm without rupture ([place]) 01/17/2016  . Paroxysmal atrial fibrillation ([place]) 12/17/2015  . S/P partial thyroidectomy 01/20/2015    4:32 PM, 06/01/21 Kyle Owen PT DPT  Physical Therapist with [facility]  [phone]    [facility] [address] [place], [place], [address] Phone: [phone]   Fax:  [phone]  Name: Kyle Owen MRN: [id number] Date of Birth: 1941-10-14

## 2021-06-07 ENCOUNTER — Ambulatory Visit (HOSPITAL_COMMUNITY): Payer: Medicare Other

## 2021-06-07 ENCOUNTER — Encounter (HOSPITAL_COMMUNITY): Payer: Self-pay

## 2021-06-07 ENCOUNTER — Other Ambulatory Visit: Payer: Self-pay

## 2021-06-07 DIAGNOSIS — M25552 Pain in left hip: Secondary | ICD-10-CM

## 2021-06-07 DIAGNOSIS — M6281 Muscle weakness (generalized): Secondary | ICD-10-CM

## 2021-06-07 DIAGNOSIS — R262 Difficulty in walking, not elsewhere classified: Secondary | ICD-10-CM | POA: Diagnosis not present

## 2021-06-07 DIAGNOSIS — M79605 Pain in left leg: Secondary | ICD-10-CM

## 2021-06-07 NOTE — Therapy (Signed)
[facility] [address] [place], [place], [address] Phone: [phone]   Fax:  [phone]  Physical Therapy Treatment  Patient Details  Name: Kyle Owen MRN: [id number] Date of Birth: 1941/12/24 Referring Provider (PT): [name] MD   Encounter Date: 06/07/2021   PT End of Session - 06/07/21 1409     Visit Number 2    Number of Visits 12    Date for PT Re-Evaluation 07/13/21    Authorization Type UHC Medicare    Progress Note Due on Visit 10    PT Start Time 1404    PT Stop Time 1444    PT Time Calculation (min) 40 min    Activity Tolerance Patient limited by fatigue;Patient tolerated treatment well    Behavior During Therapy [facility] for tasks assessed/performed             Past Medical History:  Diagnosis Date   Arthritis    DJD right knee   Benign prostatic hypertrophy with urinary frequency    Bladder cancer ([place])    Complication of anesthesia    gets combative in recovery   COPD with emphysema ([place])    Depression    Dyspnea    when walking   Dysrhythmia    a fib   History of colon polyps    History of kidney stones    History of loop recorder    loop recorder in place battery is dead has not had changed due to covid   History of pneumonia    hx Recurrent -- last bout Dec 2016 CAP-- per pt resolved   History of TIA (transient ischemic attack) no residual per pt   per neurologist note (dr Leta Baptist 11-08-2015) dx cryptogenic TIA versus arrhythia versus dysautonomia (right ophthalmic artery TIA and x3 posterior circulation TIAs   Hyperlipidemia    Hypertension    Multinodular thyroid    per pathology report 11-12-2014 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   Pulmonary nodule, left    left lower lobe x2 per CT 01-20-2016   Renal cyst, left    Stroke [facility])    TIA's   Thoracic aortic aneurysm without rupture ([place])    ascending -- ct chest 01/10/16  4.8cm   Wears hearing aid    bilateral-- wear at times    Past Surgical History:  Procedure Laterality Date   ANTERIOR CERVICAL DECOMP/DISCECTOMY FUSION N/A 04/03/2017   Procedure: Cervical three-four, Cervical four-five Anterior cervical decompression/discectomy/fusion;  Surgeon: Erline Levine, MD;  Location: [facility];  Service: Neurosurgery;  Laterality: N/A;   CATARACT EXTRACTION W/ INTRAOCULAR LENS IMPLANT Right 2016   CATARACT EXTRACTION W/PHACO  12/16/2012   Procedure: CATARACT EXTRACTION PHACO AND INTRAOCULAR LENS PLACEMENT ([place]);  Surgeon: Tonny Branch, MD;  [facility];  Service: Ophthalmology;  Laterality: Left;  CDE:17.31   COLONOSCOPY  10/03/2011   Procedure: COLONOSCOPY;  Surgeon: Jamesetta So;  [facility];  Service: Gastroenterology;  Laterality: N/A;   CYSTOSCOPY/RETROGRADE/URETEROSCOPY/STONE EXTRACTION WITH BASKET Right 03/29/2020   Procedure: CYSTOSCOPY/RETROGRADE/URETEROSCOPY/STONE EXTRACTION WITH BASKET;  Surgeon: Franchot Gallo, MD;  [facility];  Service: Urology;  Laterality: Right;  1 HR  DECOMPRESSIOIN ULNAR NERVE AND CUBITAL TUNNEL RELEASE Left 07-14-2009   elbow   EP IMPLANTABLE DEVICE N/A 08/10/2015   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   HOLMIUM LASER APPLICATION Right 01/26/9797   Procedure: HOLMIUM LASER APPLICATION;  Surgeon: Franchot Gallo, MD;  [facility];  Service: Urology;  Laterality: Right;   INGUINAL HERNIA REPAIR Right 1990's   KNEE ARTHROSCOPY Right 2015   LEFT CUBITAL TUNNEL RELEASE     POSTERIOR LUMBAR FUSION  2014   X2   SHOULDER ARTHROSCOPY Left 1990's   TEE WITHOUT CARDIOVERSION N/A 07/27/2015   Procedure: TRANSESOPHAGEAL ECHOCARDIOGRAM (TEE);  Surgeon: Lelon Perla, MD;   Location: [facility] ENDOSCOPY;  Service: Cardiovascular;  Laterality: N/A;   normal LV function, ef 55-60%,  mild AR and MR, mild dilated ascending aorta (4.2cm),  mild to moderate atherosclerosis  descending aorta,  mild to moderate TR,  negative saline microcavitation study   THYROIDECTOMY Right 01/20/2015   Procedure: RIGHT THYROIDECTOMY;  Surgeon: Ascencion Dike, MD;  Location: [facility];  Service: ENT;  Laterality: Right;   TONSILLECTOMY  as child   TOTAL HIP ARTHROPLASTY Left 12/14/2020   Procedure: TOTAL HIP ARTHROPLASTY ANTERIOR APPROACH;  Surgeon: [name], MD;  [facility];  Service: Orthopedics;  Laterality: Left;   TRANSURETHRAL RESECTION OF BLADDER TUMOR N/A 05/15/2016   Procedure: TRANSURETHRAL RESECTION OF BLADDER TUMOR (TURBT) AND INSTILLATION OF EPIRUBICIN;  Surgeon: Franchot Gallo, MD;  [facility];  Service: Urology;  Laterality: N/A;   TRANSURETHRAL RESECTION OF BLADDER TUMOR N/A 03/29/2020   Procedure: TRANSURETHRAL RESECTION OF BLADDER TUMOR (TURBT);  Surgeon: Franchot Gallo, MD;  [facility];  Service: Urology;  Laterality: N/A;    Vitals:   06/07/21 1407  SpO2: 92%     Subjective Assessment - 06/07/21 1407     Subjective Pt stated he has constant Lt hip pain that increases with weight bearing, pain scale 6-7/10 burning    Pertinent History LT THA, lumbar fusion, cervical fusion    Patient Stated Goals Be able to walk without pain/ cane    Currently in Pain? Yes    Pain Score 7     Pain Location Hip    Pain Orientation Left    Pain Descriptors / Indicators Burning;Sore;Stabbing;Sharp    Pain Type Chronic pain    Pain Onset More than a month ago    Pain Frequency Constant    Aggravating Factors  pressure to Lt hip, standing, walking, sitting    Pain Relieving Factors laying down    Effect of Pain on Daily Activities limits                               [facility] Adult PT Treatment/Exercise - 06/07/21 0001        Ambulation/Gait   Gait Comments Cueing for heel strike and increase stride length to improve mechanics      Exercises   Exercises Knee/Hip      Knee/Hip Exercises: Supine   Bridges 2 sets;10 reps    Straight Leg Raises Left;10 reps    Other Supine Knee/Hip Exercises isometric abd 10x 3", paired with exhale    Other Supine Knee/Hip Exercises march with ab set 10x 5"      Knee/Hip Exercises: Sidelying   Clams 10x 5", GTB around thigh  PT Education - 06/07/21 1909     Education Details Reviewed goals, educated importance of HEP compliance and to assure correct mechanics.    Methods Explanation;Demonstration    Comprehension Verbalized understanding;Returned demonstration              PT Short Term Goals - 06/01/21 1224       PT SHORT TERM GOAL #1   Title Patient will be independent with initial HEP and self-management strategies to improve functional outcomes    Time 3    Period Weeks    Status New    Target Date 06/22/21               PT Long Term Goals - 06/01/21 1628       PT LONG TERM GOAL #1   Title Patient will improve FOTO score to predicted value to indicate improvement in functional outcomes    Time 6    Period Weeks    Status New    Target Date 07/13/21      PT LONG TERM GOAL #2   Title Patient will report at least 60% overall improvement in subjective complaint to indicate improvement in ability to perform ADLs.    Time 6    Period Weeks    Status New    Target Date 07/13/21      PT LONG TERM GOAL #3   Title Patient will be able to ambulate at least 300 feet during 2MWT with LRAD to demonstrate improved ability to perform functional mobility and associated tasks.    Time 6    Period Weeks    Status New    Target Date 07/13/21      PT LONG TERM GOAL #4   Title Patient will be able to perform stand x 5 in < 15 seconds to demonstrate improvement in functional mobility and reduced risk for falls.    Time 6     Period Weeks    Status New    Target Date 07/13/21                   Plan - 06/07/21 1414     Clinical Impression Statement Pt arrived with wife who sat through session.  Began session revieweing goals and educating importance of HEP compliance for maxiam benefits.  Session focus on core stability and proximal strengthening.  Required cueing for breathing through all exercises as tendency to hold breathing during isometric abdominal, paired with exhale.  Cueing for form and mechanics with exercise program that was able to complete without difficulty following initial instructions.  Established HEP this session wiht printout given.    Personal Factors and Comorbidities Comorbidity 3+    Examination-Activity Limitations Bed Mobility;Sit;Stairs;Stand;Transfers;Locomotion Level    Examination-Participation Restrictions Yard Work;Community Activity    Stability/Clinical Decision Making Stable/Uncomplicated    Clinical Decision Making Low    Rehab Potential Fair    PT Frequency 2x / week    PT Duration 6 weeks    PT Treatment/Interventions ADLs/Self Care Home Management;Aquatic Therapy;Biofeedback;Cryotherapy;Electrical Stimulation;Therapeutic exercise;Orthotic Fit/Training;Compression bandaging;Scar mobilization;Balance training;Contrast Bath;DME Instruction;Passive range of motion;Visual/perceptual remediation/compensation;Vasopneumatic Device;Taping;Splinting;Manual techniques;Patient/family education;Functional mobility training;Therapeutic activities;Ultrasound;Parrafin;Fluidtherapy;Moist Heat;Iontophoresis 4mg /ml Dexamethasone;Stair training;Gait training;Neuromuscular re-education;Dry needling;Energy conservation;Spinal Manipulations;Joint Manipulations    PT Next Visit Plan Begin SL abd, prone hip strengthening, rockerboard. Continue with hip and core strengthening and progress as tolerated. Progress to standing balance and gait when appropriate. Manuals as needed    PT Home Exercise  Plan 06/07/21: ab set, march supine. SLR, bridge, clam  Consulted and Agree with Plan of Care Family member/caregiver    Family Member Consulted Wife             Patient will benefit from skilled therapeutic intervention in order to improve the following deficits and impairments:  Increased fascial restricitons, Decreased range of motion, Abnormal gait, Decreased mobility, Decreased strength, Decreased balance, Decreased activity tolerance, Impaired perceived functional ability, Pain, Difficulty walking, Improper body mechanics, Impaired flexibility  Visit Diagnosis: Difficulty in walking, not elsewhere classified  Pain in left hip  Pain in left leg  Muscle weakness (generalized)     Problem List Patient Active Problem List   Diagnosis Date Noted   Morning headache 04/04/2021   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) 04/03/2017   Thoracic ascending aortic aneurysm ([place]) 05/09/2016   Thoracic aortic aneurysm without rupture ([place]) 01/17/2016   Paroxysmal atrial fibrillation ([place]) 12/17/2015   S/P partial thyroidectomy 01/20/2015   Ihor Austin, LPTA/CLT; CBIS [phone]  Aldona Lento 06/07/2021, 7:10 PM  [facility] [address] [place], [place], [address] Phone: [phone]   Fax:  [phone]  Name: Kyle Owen MRN: [id number] Date of Birth: [birth year]

## 2021-06-10 ENCOUNTER — Encounter (HOSPITAL_COMMUNITY): Payer: Self-pay

## 2021-06-10 ENCOUNTER — Ambulatory Visit (HOSPITAL_COMMUNITY): Payer: Medicare Other

## 2021-06-10 ENCOUNTER — Other Ambulatory Visit: Payer: Self-pay

## 2021-06-10 DIAGNOSIS — R262 Difficulty in walking, not elsewhere classified: Secondary | ICD-10-CM | POA: Diagnosis not present

## 2021-06-10 DIAGNOSIS — M79605 Pain in left leg: Secondary | ICD-10-CM

## 2021-06-10 DIAGNOSIS — M25552 Pain in left hip: Secondary | ICD-10-CM

## 2021-06-10 DIAGNOSIS — M6281 Muscle weakness (generalized): Secondary | ICD-10-CM

## 2021-06-10 NOTE — Patient Instructions (Signed)
Abduction: Side Leg Lift (Eccentric) - Side-Lying    Lie on side. Lift top leg slightly higher than shoulder level. Keep top leg straight with body, toes pointing forward. Slowly lower for 3-5 seconds.  10 reps per set, 3 sets per day, 4days per week.  http://ecce.exer.us/62   Copyright  VHI. All rights reserved.   Toe / Heel Raise (Standing)    Standing with support, raise heels, then rock back on heels and raise toes. Repeat 20 times.  Copyright  VHI. All rights reserved.   Knee Extension: Sit to Stand (Eccentric)    Stand close to chair. Slowly lower self to seated position. 10 reps per set, 3 sets per day, 7 days per week. Progress to stopping midway before lowering to chair. Progress to barely touching chair.  Copyright  VHI. All rights reserved.

## 2021-06-10 NOTE — Therapy (Signed)
[place] [name] [place], [place], [address] Phone: [phone]   Fax:  [phone]  Physical Therapy Treatment  Patient Details  Name: Kyle Owen MRN: [id number] Date of Birth: [birth year] Referring Provider (PT): [name] MD   Encounter Date: 06/10/2021   PT End of Session - 06/10/21 1419     Visit Number 3    Number of Visits 12    Date for PT Re-Evaluation 07/13/21    Authorization Type UHC Medicare    Progress Note Due on Visit 10    PT Start Time 1404    PT Stop Time 1444    PT Time Calculation (min) 40 min    Activity Tolerance Patient limited by fatigue;Patient tolerated treatment well    Behavior During Therapy [facility] for tasks assessed/performed             Past Medical History:  Diagnosis Date   Arthritis    DJD right knee   Benign prostatic hypertrophy with urinary frequency    Bladder cancer ([place])    Complication of anesthesia    gets combative in recovery   COPD with emphysema ([place])    Depression    Dyspnea    when walking   Dysrhythmia    a fib   History of colon polyps    History of kidney stones    History of loop recorder    loop recorder in place battery is dead has not had changed due to covid   History of pneumonia    hx Recurrent -- last bout Dec 2016 CAP-- per pt resolved   History of TIA (transient ischemic attack) no residual per pt   per neurologist note (dr Leta Baptist 11-08-2015) dx cryptogenic TIA versus arrhythia versus dysautonomia (right ophthalmic artery TIA and x3 posterior circulation TIAs   Hyperlipidemia    Hypertension    Multinodular thyroid    per pathology report 11-12-2014 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   Pulmonary nodule, left    left lower lobe x2 per CT 01-20-2016   Renal cyst, left    Stroke [facility])    TIA's   Thoracic aortic aneurysm without rupture ([place])    ascending -- ct chest 01/10/16  4.8cm   Wears hearing aid    bilateral-- wear at times    Past Surgical History:  Procedure Laterality Date   ANTERIOR CERVICAL DECOMP/DISCECTOMY FUSION N/A 04/03/2017   Procedure: Cervical three-four, Cervical four-five Anterior cervical decompression/discectomy/fusion;  Surgeon: Erline Levine, MD;  Location: [facility];  Service: Neurosurgery;  Laterality: N/A;   CATARACT EXTRACTION W/ INTRAOCULAR LENS IMPLANT Right 2016   CATARACT EXTRACTION W/PHACO  12/16/2012   Procedure: CATARACT EXTRACTION PHACO AND INTRAOCULAR LENS PLACEMENT ([place]);  Surgeon: Tonny Branch, MD;  [facility]: [facility];  Service: Ophthalmology;  Laterality: Left;  CDE:17.31   COLONOSCOPY  10/03/2011   Procedure: COLONOSCOPY;  Surgeon: Jamesetta So;  [facility];  Service: Gastroenterology;  Laterality: N/A;   CYSTOSCOPY/RETROGRADE/URETEROSCOPY/STONE EXTRACTION WITH BASKET Right 03/29/2020   Procedure: CYSTOSCOPY/RETROGRADE/URETEROSCOPY/STONE EXTRACTION WITH BASKET;  Surgeon: Franchot Gallo, MD;  [facility];  Service: Urology;  Laterality: Right;  1 HR  DECOMPRESSIOIN ULNAR NERVE AND CUBITAL TUNNEL RELEASE Left 07-14-2009   elbow   EP IMPLANTABLE DEVICE N/A 08/10/2015   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   HOLMIUM LASER APPLICATION Right 09/03/3715   Procedure: HOLMIUM LASER APPLICATION;  Surgeon: Franchot Gallo, MD;  [facility];  Service: Urology;  Laterality: Right;   INGUINAL HERNIA REPAIR Right 1990's   KNEE ARTHROSCOPY Right 2015   LEFT CUBITAL TUNNEL RELEASE     POSTERIOR LUMBAR FUSION  2014   X2   SHOULDER ARTHROSCOPY Left 1990's   TEE WITHOUT CARDIOVERSION N/A 07/27/2015   Procedure: TRANSESOPHAGEAL ECHOCARDIOGRAM (TEE);  Surgeon: Lelon Perla, MD;   Location: [facility] ENDOSCOPY;  Service: Cardiovascular;  Laterality: N/A;   normal LV function, ef 55-60%,  mild AR and MR, mild dilated ascending aorta (4.2cm),  mild to moderate atherosclerosis  descending aorta,  mild to moderate TR,  negative saline microcavitation study   THYROIDECTOMY Right 01/20/2015   Procedure: RIGHT THYROIDECTOMY;  Surgeon: Ascencion Dike, MD;  Location: [facility] OR;  Service: ENT;  Laterality: Right;   TONSILLECTOMY  as child   TOTAL HIP ARTHROPLASTY Left 12/14/2020   Procedure: TOTAL HIP ARTHROPLASTY ANTERIOR APPROACH;  Surgeon: [name], MD;  [facility];  Service: Orthopedics;  Laterality: Left;   TRANSURETHRAL RESECTION OF BLADDER TUMOR N/A 05/15/2016   Procedure: TRANSURETHRAL RESECTION OF BLADDER TUMOR (TURBT) AND INSTILLATION OF EPIRUBICIN;  Surgeon: Franchot Gallo, MD;  Location: [facility];  Service: Urology;  Laterality: N/A;   TRANSURETHRAL RESECTION OF BLADDER TUMOR N/A 03/29/2020   Procedure: TRANSURETHRAL RESECTION OF BLADDER TUMOR (TURBT);  Surgeon: Franchot Gallo, MD;  [facility];  Service: Urology;  Laterality: N/A;    There were no vitals filed for this visit.   Subjective Assessment - 06/10/21 1408     Subjective Pt stated hip is feeling okay today, has started prednisone today for 6 days.    Patient Stated Goals Be able to walk without pain/ cane    Currently in Pain? No/denies                               [facility] Adult PT Treatment/Exercise - 06/10/21 0001       Ambulation/Gait   Ambulation/Gait Yes    Ambulation/Gait Assistance 5: Supervision;4: Min guard    Ambulation Distance (Feet) 260 Feet    Assistive device Straight cane    Gait Pattern Decreased arm swing - left;Decreased step length - right;Decreased step length - left;Decreased stride length;Narrow base of support    Ambulation Surface Level;Indoor    Gait velocity decreased    Gait Comments 2MWT      Exercises   Exercises Knee/Hip       Knee/Hip Exercises: Standing   Heel Raises 10 reps    Heel Raises Limitations toe raise    Rocker Board 2 minutes    Rocker Board Limitations lateral      Knee/Hip Exercises: Seated   Sit to Sand 10 reps;without UE support   standard height     Knee/Hip Exercises: Supine   Bridges 15 reps    Straight Leg Raises Both;2 sets;10 reps    Straight Leg Raises Limitations quad set prior raise      Knee/Hip Exercises: Sidelying   Hip ABduction Both;10 reps      Knee/Hip Exercises: Prone   Hip Extension Both;10 reps  PT Short Term Goals - 06/01/21 1224       PT SHORT TERM GOAL #1   Title Patient will be independent with initial HEP and self-management strategies to improve functional outcomes    Time 3    Period Weeks    Status New    Target Date 06/22/21               PT Long Term Goals - 06/01/21 1628       PT LONG TERM GOAL #1   Title Patient will improve FOTO score to predicted value to indicate improvement in functional outcomes    Time 6    Period Weeks    Status New    Target Date 07/13/21      PT LONG TERM GOAL #2   Title Patient will report at least 60% overall improvement in subjective complaint to indicate improvement in ability to perform ADLs.    Time 6    Period Weeks    Status New    Target Date 07/13/21      PT LONG TERM GOAL #3   Title Patient will be able to ambulate at least 300 feet during 2MWT with LRAD to demonstrate improved ability to perform functional mobility and associated tasks.    Time 6    Period Weeks    Status New    Target Date 07/13/21      PT LONG TERM GOAL #4   Title Patient will be able to perform stand x 5 in < 15 seconds to demonstrate improvement in functional mobility and reduced risk for falls.    Time 6    Period Weeks    Status New    Target Date 07/13/21                   Plan - 06/10/21 1450     Clinical Impression Statement Pt tolerated well towards session.   Added standing heel/toe raises and rockerboard exercises to improve weight distribution and strength for gait mechanics.  Added hip strengthening with SLR all direction, cueing for quad set prior supine SLR.  Pt improved mechanics with ability to complete sit to stands without hands.  Improved gait mechanics and increased distance wiht 2MWT.  Given additional HEP for hip strengthening.    Personal Factors and Comorbidities Comorbidity 3+    Examination-Activity Limitations Bed Mobility;Sit;Stairs;Stand;Transfers;Locomotion Level    Examination-Participation Restrictions Yard Work;Community Activity    Stability/Clinical Decision Making Stable/Uncomplicated    Clinical Decision Making Low    Rehab Potential Fair    PT Frequency 2x / week    PT Duration 6 weeks    PT Treatment/Interventions ADLs/Self Care Home Management;Aquatic Therapy;Biofeedback;Cryotherapy;Electrical Stimulation;Therapeutic exercise;Orthotic Fit/Training;Compression bandaging;Scar mobilization;Balance training;Contrast Bath;DME Instruction;Passive range of motion;Visual/perceptual remediation/compensation;Vasopneumatic Device;Taping;Splinting;Manual techniques;Patient/family education;Functional mobility training;Therapeutic activities;Ultrasound;Parrafin;Fluidtherapy;Moist Heat;Iontophoresis 4mg /ml Dexamethasone;Stair training;Gait training;Neuromuscular re-education;Dry needling;Energy conservation;Spinal Manipulations;Joint Manipulations    PT Next Visit Plan Progress functional strengtening with additional standing exercises.  Add standing abd and extension, squats, lunges, SLS, sidestep, step up.  Progress balance, gait and core/hip strengthening.  Manual as needed.    PT Home Exercise Plan 06/07/21: ab set, march supine. SLR, bridge, clam; 06/10/21: STS, sidelying ABD, heel/toe raises.    Consulted and Agree with Plan of Care Family member/caregiver    Family Member Consulted Wife             Patient will benefit from  skilled therapeutic intervention in order to improve the following deficits and impairments:  Increased fascial restricitons,  Decreased range of motion, Abnormal gait, Decreased mobility, Decreased strength, Decreased balance, Decreased activity tolerance, Impaired perceived functional ability, Pain, Difficulty walking, Improper body mechanics, Impaired flexibility  Visit Diagnosis: Difficulty in walking, not elsewhere classified  Pain in left hip  Pain in left leg  Muscle weakness (generalized)     Problem List Patient Active Problem List   Diagnosis Date Noted   Morning headache 04/04/2021   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 [place], [place], [address] Phone: [phone]   Fax:  [phone]  Name: LEMOND GRIFFEE MRN: [id number] Date of Birth: October 25, 1941

## 2021-06-14 ENCOUNTER — Encounter (HOSPITAL_COMMUNITY): Payer: Self-pay | Admitting: Physical Therapy

## 2021-06-14 ENCOUNTER — Other Ambulatory Visit: Payer: Self-pay

## 2021-06-14 ENCOUNTER — Ambulatory Visit (HOSPITAL_COMMUNITY): Payer: Medicare Other | Admitting: Physical Therapy

## 2021-06-14 DIAGNOSIS — M6281 Muscle weakness (generalized): Secondary | ICD-10-CM

## 2021-06-14 DIAGNOSIS — R262 Difficulty in walking, not elsewhere classified: Secondary | ICD-10-CM | POA: Diagnosis not present

## 2021-06-14 DIAGNOSIS — M25552 Pain in left hip: Secondary | ICD-10-CM

## 2021-06-14 NOTE — Patient Instructions (Signed)
Access Code: [email or web address] URL: [email or web address] Date: 06/14/2021 Prepared by: Mitzi Hansen Valentine Kuechle  Exercises Mini Squat with Counter Support - 1 x daily - 7 x weekly - 3 sets - 10 reps Standing Hip Abduction with Counter Support - 1 x daily - 7 x weekly - 3 sets - 10 reps

## 2021-06-14 NOTE — Therapy (Signed)
[facility] [address] [place], [place], [address] Phone: [phone]   Fax:  [phone]  Physical Therapy Treatment  Patient Details  Name: Kyle Owen MRN: [id number] Date of Birth: 12/01/1941 Referring Provider (PT): [name] MD   Encounter Date: 06/14/2021   PT End of Session - 06/14/21 1447     Visit Number 4    Number of Visits 12    Date for PT Re-Evaluation 07/13/21    Authorization Type U[employer] Medicare    Progress Note Due on Visit 10    PT Start Time 1446    PT Stop Time 1526    PT Time Calculation (min) 40 min    Activity Tolerance Patient limited by fatigue;Patient tolerated treatment well    Behavior During Therapy [facility] for tasks assessed/performed             Past Medical History:  Diagnosis Date   Arthritis    DJD right knee   Benign prostatic hypertrophy with urinary frequency    Bladder cancer ([place])    Complication of anesthesia    gets combative in recovery   COPD with emphysema ([place])    Depression    Dyspnea    when walking   Dysrhythmia    a fib   History of colon polyps    History of kidney stones    History of loop recorder    loop recorder in place battery is dead has not had changed due to covid   History of pneumonia    hx Recurrent -- last bout Dec 2016 CAP-- per pt resolved   History of TIA (transient ischemic attack) no residual per pt   per neurologist note (dr Leta Baptist 11-08-2015) dx cryptogenic TIA versus arrhythia versus dysautonomia (right ophthalmic artery TIA and x3 posterior circulation TIAs   Hyperlipidemia    Hypertension    Multinodular thyroid    per pathology report 11-12-2014 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   Pulmonary nodule, left    left lower lobe x2 per CT 01-20-2016   Renal cyst, left    Stroke [facility])    TIA's   Thoracic aortic aneurysm without rupture ([place])    ascending -- ct chest 01/10/16  4.8cm   Wears hearing aid    bilateral-- wear at times    Past Surgical History:  Procedure Laterality Date   ANTERIOR CERVICAL DECOMP/DISCECTOMY FUSION N/A 04/03/2017   Procedure: Cervical three-four, Cervical four-five Anterior cervical decompression/discectomy/fusion;  Surgeon: Erline Levine, MD;  Location: [place];  Service: Neurosurgery;  Laterality: N/A;   CATARACT EXTRACTION W/ INTRAOCULAR LENS IMPLANT Right 2016   CATARACT EXTRACTION W/PHACO  12/16/2012   Procedure: CATARACT EXTRACTION PHACO AND INTRAOCULAR LENS PLACEMENT ([place]);  Surgeon: Tonny Branch, MD;  [facility];  Service: Ophthalmology;  Laterality: Left;  CDE:17.31   COLONOSCOPY  10/03/2011   Procedure: COLONOSCOPY;  Surgeon: Jamesetta So;  [facility];  Service: Gastroenterology;  Laterality: N/A;   CYSTOSCOPY/RETROGRADE/URETEROSCOPY/STONE EXTRACTION WITH BASKET Right 03/29/2020   Procedure: CYSTOSCOPY/RETROGRADE/URETEROSCOPY/STONE EXTRACTION WITH BASKET;  Surgeon: Franchot Gallo, MD;  [facility];  Service: Urology;  Laterality: Right;  1 HR  DECOMPRESSIOIN ULNAR NERVE AND CUBITAL TUNNEL RELEASE Left 07-14-2009   elbow   EP IMPLANTABLE DEVICE N/A 08/10/2015   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   HOLMIUM LASER APPLICATION Right 03/01/2504   Procedure: HOLMIUM LASER APPLICATION;  Surgeon: Franchot Gallo, MD;  [facility];  Service: Urology;  Laterality: Right;   INGUINAL HERNIA REPAIR Right 1990's   KNEE ARTHROSCOPY Right 2015   LEFT CUBITAL TUNNEL RELEASE     POSTERIOR LUMBAR FUSION  2014   X2   SHOULDER ARTHROSCOPY Left 1990's   TEE WITHOUT CARDIOVERSION N/A 07/27/2015   Procedure: TRANSESOPHAGEAL ECHOCARDIOGRAM (TEE);  Surgeon: Lelon Perla, MD;   Location: [facility] END[facility]COPY;  Service: Cardiovascular;  Laterality: N/A;   normal LV function, ef 55-60%,  mild AR and MR, mild dilated ascending aorta (4.2cm),  mild to moderate atherosclerosis  descending aorta,  mild to moderate TR,  negative saline microcavitation study   THYROIDECTOMY Right 01/20/2015   Procedure: RIGHT THYROIDECTOMY;  Surgeon: Ascencion Dike, MD;  Location[facility];  Service: ENT;  Laterality: Right;   TONSILLECTOMY  as child   TOTAL HIP ARTHROPLASTY Left 12/14/2020   Procedure: TOTAL HIP ARTHROPLASTY ANTERIOR APPROACH;  Surgeon: [name], MD;  [facility];  Service: Orthopedics;  Laterality: Left;   TRANSURETHRAL RESECTION OF BLADDER TUMOR N/A 05/15/2016   Procedure: TRANSURETHRAL RESECTION OF BLADDER TUMOR (TURBT) AND INSTILLATION OF EPIRUBICIN;  Surgeon: Franchot Gallo, MD;  [facility];  Service: Urology;  Laterality: N/A;   TRANSURETHRAL RESECTION OF BLADDER TUMOR N/A 03/29/2020   Procedure: TRANSURETHRAL RESECTION OF BLADDER TUMOR (TURBT);  Surgeon: Franchot Gallo, MD;  [facility];  Service: Urology;  Laterality: N/A;    There were no vitals filed for this visit.   Subjective Assessment - 06/14/21 1448     Subjective Patient stated hip is feeling slightly better. Still on prednisone pack. He has been doing some HEP. He has been mowing alot.    Patient Stated Goals Be able to walk without pain/ cane    Currently in Pain? Yes    Pain Score 1     Pain Location Hip    Pain Orientation Left    Pain Descriptors / Indicators Sore    Pain Type Chronic pain    Pain Onset More than a month ago    Pain Frequency Constant                               [facility] Adult PT Treatment/Exercise - 06/14/21 0001       Knee/Hip Exercises: Standing   Heel Raises 20 reps    Heel Raises Limitations toe raisex 20    Hip Flexion Both;2 sets;10 reps    Hip Flexion Limitations alternating march    Hip Abduction AROM;Both;2  sets;10 reps    Hip Extension AROM;Both;2 sets;10 reps    Functional Squat 2 sets;10 reps    Functional Squat Limitations with UE support    Other Standing Knee Exercises tandem stance 1x 30 second holds bilateral      Knee/Hip Exercises: Seated   Other Seated Knee/Hip Exercises hip abduction iso 10x 10 second holds                    PT Education - 06/14/21 1448     Education Details HEP    Person(s) Educated Patient;Spouse    Methods Explanation;Demonstration  Comprehension Verbalized understanding;Returned demonstration              PT Short Term Goals - 06/01/21 1224       PT SHORT TERM GOAL #1   Title Patient will be independent with initial HEP and self-management strategies to improve functional outcomes    Time 3    Period Weeks    Status New    Target Date 06/22/21               PT Long Term Goals - 06/01/21 1628       PT LONG TERM GOAL #1   Title Patient will improve FOTO score to predicted value to indicate improvement in functional outcomes    Time 6    Period Weeks    Status New    Target Date 07/13/21      PT LONG TERM GOAL #2   Title Patient will report at least 60% overall improvement in subjective complaint to indicate improvement in ability to perform ADLs.    Time 6    Period Weeks    Status New    Target Date 07/13/21      PT LONG TERM GOAL #3   Title Patient will be able to ambulate at least 300 feet during 2MWT with LRAD to demonstrate improved ability to perform functional mobility and associated tasks.    Time 6    Period Weeks    Status New    Target Date 07/13/21      PT LONG TERM GOAL #4   Title Patient will be able to perform stand x 5 in < 15 seconds to demonstrate improvement in functional mobility and reduced risk for falls.    Time 6    Period Weeks    Status New    Target Date 07/13/21                   Plan - 06/14/21 1447     Clinical Impression Statement Patient tolerates addition of  standing exercises today. Began standing hip strengthening with cueing for glute activation on stance leg. Patient requires frequent seated rest breaks during session for fatigue. Patient with increased LLE pain with weight bearing and stance on LLE secondary to weakness. Patient requires freuqent cueing for squatting mechanics with UE support. Patient will continue to benefit from skilled physical therapy in order to reduce impairment and improve function.    Personal Factors and Comorbidities Comorbidity 3+    Examination-Activity Limitations Bed Mobility;Sit;Stairs;Stand;Transfers;Locomotion Level    Examination-Participation Restrictions Yard Work;Community Activity    Stability/Clinical Decision Making Stable/Uncomplicated    Rehab Potential Fair    PT Frequency 2x / week    PT Duration 6 weeks    PT Treatment/Interventions ADLs/Self Care Home Management;Aquatic Therapy;Biofeedback;Cryotherapy;Electrical Stimulation;Therapeutic exercise;Orthotic Fit/Training;Compression bandaging;Scar mobilization;Balance training;Contrast Bath;DME Instruction;Passive range of motion;Visual/perceptual remediation/compensation;Vasopneumatic Device;Taping;Splinting;Manual techniques;Patient/family education;Functional mobility training;Therapeutic activities;Ultrasound;Parrafin;Fluidtherapy;Moist Heat;Iontophoresis 4mg /ml Dexamethasone;Stair training;Gait training;Neuromuscular re-education;Dry needling;Energy conservation;Spinal Manipulations;Joint Manipulations    PT Next Visit Plan Progress functional strengtening with additional standing exercises.  Add standing abd and extension, squats, lunges, SLS, sidestep, step up.  Progress balance, gait and core/hip strengthening.  Manual as needed.    PT Home Exercise Plan 06/07/21: ab set, march supine. SLR, bridge, clam; 06/10/21: STS, sidelying ABD, heel/toe raises.6/21 standing hip abd, mini squats    Consulted and Agree with Plan of Care Family member/caregiver     Family Member Consulted Wife  Patient will benefit from skilled therapeutic intervention in order to improve the following deficits and impairments:  Increased fascial restricitons, Decreased range of motion, Abnormal gait, Decreased mobility, Decreased strength, Decreased balance, Decreased activity tolerance, Impaired perceived functional ability, Pain, Difficulty walking, Improper body mechanics, Impaired flexibility  Visit Diagnosis: Difficulty in walking, not elsewhere classified  Pain in left hip  Muscle weakness (generalized)     Problem List Patient Active Problem List   Diagnosis Date Noted   Morning headache 04/04/2021   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

## 2021-06-16 ENCOUNTER — Ambulatory Visit (HOSPITAL_COMMUNITY): Payer: Medicare Other

## 2021-06-16 ENCOUNTER — Other Ambulatory Visit: Payer: Self-pay

## 2021-06-16 ENCOUNTER — Encounter (HOSPITAL_COMMUNITY): Payer: Self-pay

## 2021-06-16 DIAGNOSIS — M25552 Pain in left hip: Secondary | ICD-10-CM

## 2021-06-16 DIAGNOSIS — M6281 Muscle weakness (generalized): Secondary | ICD-10-CM

## 2021-06-16 DIAGNOSIS — M79605 Pain in left leg: Secondary | ICD-10-CM

## 2021-06-16 DIAGNOSIS — R262 Difficulty in walking, not elsewhere classified: Secondary | ICD-10-CM | POA: Diagnosis not present

## 2021-06-16 NOTE — Therapy (Signed)
[facility] [address] [place], [place], [address] Phone: [phone]   Fax:  [phone]  Physical Therapy Treatment  Patient Details  Name: Kyle Owen MRN: [id number] Date of Birth: 20-Apr-1941 Referring Provider (PT): [name] MD   Encounter Date: 06/16/2021   PT End of Session - 06/16/21 1111     Visit Number 5    Number of Visits 12    Date for PT Re-Evaluation 07/13/21    Authorization Type UHC Medicare    Progress Note Due on Visit 10    PT Start Time 1046    PT Stop Time 1130    PT Time Calculation (min) 44 min    Equipment Utilized During Treatment Gait belt    Activity Tolerance Patient limited by fatigue;Patient tolerated treatment well    Behavior During Therapy [facility] for tasks assessed/performed             Past Medical History:  Diagnosis Date   Arthritis    DJD right knee   Benign prostatic hypertrophy with urinary frequency    Bladder cancer ([place])    Complication of anesthesia    gets combative in recovery   COPD with emphysema ([place])    Depression    Dyspnea    when walking   Dysrhythmia    a fib   History of colon polyps    History of kidney stones    History of loop recorder    loop recorder in place battery is dead has not had changed due to covid   History of pneumonia    hx Recurrent -- last bout Dec 2016 CAP-- per pt resolved   History of TIA (transient ischemic attack) no residual per pt   per neurologist note (dr Leta Baptist 11-08-2015) dx cryptogenic TIA versus arrhythia versus dysautonomia (right ophthalmic artery TIA and x3 posterior circulation TIAs   Hyperlipidemia    Hypertension    Multinodular thyroid    per pathology report 11-12-2014 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   Pulmonary nodule, left    left lower lobe x2 per CT 01-20-2016   Renal cyst, left    Stroke [facility])    TIA's   Thoracic aortic aneurysm without rupture ([place])    ascending -- ct chest 01/10/16  4.8cm   Wears hearing aid    bilateral-- wear at times    Past Surgical History:  Procedure Laterality Date   ANTERIOR CERVICAL DECOMP/DISCECTOMY FUSION N/A 04/03/2017   Procedure: Cervical three-four, Cervical four-five Anterior cervical decompression/discectomy/fusion;  Surgeon: Erline Levine, MD;  Location: [place];  Service: Neurosurgery;  Laterality: N/A;   CATARACT EXTRACTION W/ INTRAOCULAR LENS IMPLANT Right 2016   CATARACT EXTRACTION W/PHACO  12/16/2012   Procedure: CATARACT EXTRACTION PHACO AND INTRAOCULAR LENS PLACEMENT ([place]);  Surgeon: Tonny Branch, MD;  [facility];  Service: Ophthalmology;  Laterality: Left;  CDE:17.31   COLONOSCOPY  10/03/2011   Procedure: COLONOSCOPY;  Surgeon: Jamesetta So;  [facility];  Service: Gastroenterology;  Laterality: N/A;   CYSTOSCOPY/RETROGRADE/URETEROSCOPY/STONE EXTRACTION WITH BASKET Right 03/29/2020   Procedure: CYSTOSCOPY/RETROGRADE/URETEROSCOPY/STONE EXTRACTION WITH BASKET;  Surgeon: Franchot Gallo, MD;  [facility];  Service:  Urology;  Laterality: Right;  1 HR   DECOMPRESSIOIN ULNAR NERVE AND CUBITAL TUNNEL RELEASE Left 07-14-2009   elbow   EP IMPLANTABLE DEVICE N/A 08/10/2015   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   HOLMIUM LASER APPLICATION Right 08/28/4966   Procedure: HOLMIUM LASER APPLICATION;  Surgeon: Franchot Gallo, MD;  [facility];  Service: Urology;  Laterality: Right;   INGUINAL HERNIA REPAIR Right 1990's   KNEE ARTHROSCOPY Right 2015   LEFT CUBITAL TUNNEL RELEASE     POSTERIOR LUMBAR FUSION  2014   X2   SHOULDER ARTHROSCOPY Left 1990's   TEE WITHOUT CARDIOVERSION N/A 07/27/2015   Procedure: TRANSESOPHAGEAL  ECHOCARDIOGRAM (TEE);  Surgeon: Lelon Perla, MD;  [facility];  Service: Cardiovascular;  Laterality: N/A;   normal LV function, ef 55-60%,  mild AR and MR, mild dilated ascending aorta (4.2cm),  mild to moderate atherosclerosis  descending aorta,  mild to moderate TR,  negative saline microcavitation study   THYROIDECTOMY Right 01/20/2015   Procedure: RIGHT THYROIDECTOMY;  Surgeon: Ascencion Dike, MD;  Location: [facility] OR;  Service: ENT;  Laterality: Right;   TONSILLECTOMY  as child   TOTAL HIP ARTHROPLASTY Left 12/14/2020   Procedure: TOTAL HIP ARTHROPLASTY ANTERIOR APPROACH;  Surgeon: [name], MD;  [facility];  Service: Orthopedics;  Laterality: Left;   TRANSURETHRAL RESECTION OF BLADDER TUMOR N/A 05/15/2016   Procedure: TRANSURETHRAL RESECTION OF BLADDER TUMOR (TURBT) AND INSTILLATION OF EPIRUBICIN;  Surgeon: Franchot Gallo, MD;  Location: [facility];  Service: Urology;  Laterality: N/A;   TRANSURETHRAL RESECTION OF BLADDER TUMOR N/A 03/29/2020   Procedure: TRANSURETHRAL RESECTION OF BLADDER TUMOR (TURBT);  Surgeon: Franchot Gallo, MD;  [facility];  Service: Urology;  Laterality: N/A;    There were no vitals filed for this visit.   Subjective Assessment - 06/16/21 1057     Subjective Pt stated today last day of prednisone.  Pain low, 1/10 standing and increases to 3/10 during gait.  Wife asked if possible infection due to pain.    Pertinent History LT THA, lumbar fusion, cervical fusion    Patient Stated Goals Be able to walk without pain/ cane    Currently in Pain? Yes    Pain Score 3     Pain Location Leg    Pain Orientation Left    Pain Descriptors / Indicators Sore    Pain Type Chronic pain    Pain Onset More than a month ago    Pain Frequency Constant                               [facility] Adult PT Treatment/Exercise - 06/16/21 0001       Ambulation/Gait   Ambulation/Gait Yes    Ambulation/Gait Assistance 5:  Supervision;4: Min guard    Ambulation Distance (Feet) 226 Feet    Assistive device Straight cane    Gait Pattern Decreased arm swing - left;Decreased step length - right;Decreased step length - left;Decreased stride length;Narrow base of support    Ambulation Surface Level;Indoor    Gait velocity decreased    Gait Comments Cueing for increased stride length, heel/toe      Exercises   Exercises Knee/Hip      Knee/Hip Exercises: Standing   Heel Raises 20 reps    Heel Raises Limitations toe raisex 20    Hip Flexion Both;2 sets;10 reps    Hip Flexion  Limitations toe tapping with minimal hand use 6in step height    Forward Lunges Both;10 reps    Forward Lunges Limitations 6in step    Hip Abduction AROM;Both;2 sets;10 reps    Hip Extension AROM;Both;2 sets;10 reps    Functional Squat 2 sets;10 reps    Functional Squat Limitations with UE support    Other Standing Knee Exercises tandem stance 1x 30 second holds bilateral    Other Standing Knee Exercises 3D hip excursion                    PT Education - 06/16/21 1110     Education Details Pt educated on s/s of infection, therapisted viewed incision with no heat, redness and no open incision    Person(s) Educated Patient    Methods Explanation    Comprehension Verbalized understanding              PT Short Term Goals - 06/01/21 1224       PT SHORT TERM GOAL #1   Title Patient will be independent with initial HEP and self-management strategies to improve functional outcomes    Time 3    Period Weeks    Status New    Target Date 06/22/21               PT Long Term Goals - 06/01/21 1628       PT LONG TERM GOAL #1   Title Patient will improve FOTO score to predicted value to indicate improvement in functional outcomes    Time 6    Period Weeks    Status New    Target Date 07/13/21      PT LONG TERM GOAL #2   Title Patient will report at least 60% overall improvement in subjective complaint to  indicate improvement in ability to perform ADLs.    Time 6    Period Weeks    Status New    Target Date 07/13/21      PT LONG TERM GOAL #3   Title Patient will be able to ambulate at least 300 feet during 2MWT with LRAD to demonstrate improved ability to perform functional mobility and associated tasks.    Time 6    Period Weeks    Status New    Target Date 07/13/21      PT LONG TERM GOAL #4   Title Patient will be able to perform stand x 5 in < 15 seconds to demonstrate improvement in functional mobility and reduced risk for falls.    Time 6    Period Weeks    Status New    Target Date 07/13/21                   Plan - 06/16/21 1325     Clinical Impression Statement Wife stated she was concerned wiht infection due to hip pain.  Pt and wife educated s/s of infection and what to look for, therapist visualized incision wiht no s/s of infection, no heat, redness or opening of incision.  Pt continues to reports increased Lt LE pain wiht stance phase and weight bearing during gait.  Added 3D hip excursion to improve tolerance with stance phase, reports improved tolerance with increased time.  Gait training to improve stride length to normalize gait mechanics.  Continued hip strengthening exercises with cueing for form.  EOS pt limited by fatigue    Personal Factors and Comorbidities Comorbidity 3+    Examination-Activity Limitations Bed Mobility;Sit;Stairs;Stand;Transfers;Locomotion  Level    Examination-Participation Restrictions Yard Work;Community Activity    Stability/Clinical Decision Making Stable/Uncomplicated    Clinical Decision Making Low    Rehab Potential Fair    PT Frequency 2x / week    PT Duration 6 weeks    PT Treatment/Interventions ADLs/Self Care Home Management;Aquatic Therapy;Biofeedback;Cryotherapy;Electrical Stimulation;Therapeutic exercise;Orthotic Fit/Training;Compression bandaging;Scar mobilization;Balance training;Contrast Bath;DME Instruction;Passive  range of motion;Visual/perceptual remediation/compensation;Vasopneumatic Device;Taping;Splinting;Manual techniques;Patient/family education;Functional mobility training;Therapeutic activities;Ultrasound;Parrafin;Fluidtherapy;Moist Heat;Iontophoresis 4mg /ml Dexamethasone;Stair training;Gait training;Neuromuscular re-education;Dry needling;Energy conservation;Spinal Manipulations;Joint Manipulations    PT Next Visit Plan Progress functional strengtheing.  Begin step up training next session.  Progress gait, balance and core/hip strengthening.  Manual as needed.    PT Home Exercise Plan 06/07/21: ab set, march supine. SLR, bridge, clam; 06/10/21: STS, sidelying ABD, heel/toe raises.6/21 standing hip abd, mini squats    Consulted and Agree with Plan of Care Family member/caregiver    Family Member Consulted Wife             Patient will benefit from skilled therapeutic intervention in order to improve the following deficits and impairments:  Increased fascial restricitons, Decreased range of motion, Abnormal gait, Decreased mobility, Decreased strength, Decreased balance, Decreased activity tolerance, Impaired perceived functional ability, Pain, Difficulty walking, Improper body mechanics, Impaired flexibility  Visit Diagnosis: Difficulty in walking, not elsewhere classified  Pain in left hip  Muscle weakness (generalized)  Pain in left leg     Problem List Patient Active Problem List   Diagnosis Date Noted   Morning headache 04/04/2021   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   COPD with chronic bronchitis and emphysema ([place]) 09/22/2020   Penile pain 04/07/2020   Balanitis 04/07/2020   Cervical myelopathy ([place]) 04/03/2017   Thoracic ascending aortic aneurysm ([place]) 05/09/2016   Thoracic aortic aneurysm without rupture ([place]) 01/17/2016   Paroxysmal atrial fibrillation ([place]) 12/17/2015   S/P partial thyroidectomy  01/20/2015   Ihor Austin, LPTA/CLT; CBIS [phone]  Aldona Lento 06/16/2021, 1:41 PM  [facility] [address] [place], [place], [address] Phone: [phone]   Fax:  [phone]  Name: Kyle Owen MRN: [id number] Date of Birth: 10/19/1941

## 2021-06-21 ENCOUNTER — Other Ambulatory Visit: Payer: Self-pay

## 2021-06-21 ENCOUNTER — Ambulatory Visit (HOSPITAL_COMMUNITY): Payer: Medicare Other | Admitting: Physical Therapy

## 2021-06-21 ENCOUNTER — Encounter (HOSPITAL_COMMUNITY): Payer: Self-pay | Admitting: Physical Therapy

## 2021-06-21 DIAGNOSIS — M79605 Pain in left leg: Secondary | ICD-10-CM

## 2021-06-21 DIAGNOSIS — R262 Difficulty in walking, not elsewhere classified: Secondary | ICD-10-CM | POA: Diagnosis not present

## 2021-06-21 DIAGNOSIS — M6281 Muscle weakness (generalized): Secondary | ICD-10-CM

## 2021-06-21 DIAGNOSIS — M25552 Pain in left hip: Secondary | ICD-10-CM

## 2021-06-21 NOTE — Therapy (Signed)
[place] [address] [place], [place], [address] Phone: [phone]   Fax:  [phone]  Physical Therapy Treatment  Patient Details  Name: Kyle Owen MRN: [id number] Date of Birth: 01-30-1941 Referring Provider (PT): [name] MD   Encounter Date: 06/21/2021   PT End of Session - 06/21/21 1355     Visit Number 6    Number of Visits 12    Date for PT Re-Evaluation 07/13/21    Authorization Type UHC Medicare    Progress Note Due on Visit 10    PT Start Time 1350    PT Stop Time 1430    PT Time Calculation (min) 40 min    Equipment Utilized During Treatment Gait belt    Activity Tolerance Patient limited by fatigue;Patient tolerated treatment well    Behavior During Therapy [facility] for tasks assessed/performed             Past Medical History:  Diagnosis Date   Arthritis    DJD right knee   Benign prostatic hypertrophy with urinary frequency    Bladder cancer ([place])    Complication of anesthesia    gets combative in recovery   COPD with emphysema ([place])    Depression    Dyspnea    when walking   Dysrhythmia    a fib   History of colon polyps    History of kidney stones    History of loop recorder    loop recorder in place battery is dead has not had changed due to covid   History of pneumonia    hx Recurrent -- last bout Dec 2016 CAP-- per pt resolved   History of TIA (transient ischemic attack) no residual per pt   per neurologist note (dr Leta Baptist 11-08-2015) dx cryptogenic TIA versus arrhythia versus dysautonomia (right ophthalmic artery TIA and x3 posterior circulation TIAs   Hyperlipidemia    Hypertension    Multinodular thyroid    per pathology report 11-12-2014 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   Pulmonary nodule, left    left lower lobe x2 per CT 01-20-2016   Renal cyst, left    Stroke [facility])    TIA's   Thoracic aortic aneurysm without rupture ([place])    ascending -- ct chest 01/10/16  4.8cm   Wears hearing aid    bilateral-- wear at times    Past Surgical History:  Procedure Laterality Date   ANTERIOR CERVICAL DECOMP/DISCECTOMY FUSION N/A 04/03/2017   Procedure: Cervical three-four, Cervical four-five Anterior cervical decompression/discectomy/fusion;  Surgeon: Erline Levine, MD;  Location: [place];  Service: Neurosurgery;  Laterality: N/A;   CATARACT EXTRACTION W/ INTRAOCULAR LENS IMPLANT Right 2016   CATARACT EXTRACTION W/PHACO  12/16/2012   Procedure: CATARACT EXTRACTION PHACO AND INTRAOCULAR LENS PLACEMENT ([place]);  Surgeon: Tonny Branch, MD;  [facility];  Service: Ophthalmology;  Laterality: Left;  CDE:17.31   COLONOSCOPY  10/03/2011   Procedure: COLONOSCOPY;  Surgeon: Jamesetta So;  [facility];  Service: Gastroenterology;  Laterality: N/A;   CYSTOSCOPY/RETROGRADE/URETEROSCOPY/STONE EXTRACTION WITH BASKET Right 03/29/2020   Procedure: CYSTOSCOPY/RETROGRADE/URETEROSCOPY/STONE EXTRACTION WITH BASKET;  Surgeon: Franchot Gallo, MD;  [facility];  Service:  Urology;  Laterality: Right;  1 HR   DECOMPRESSIOIN ULNAR NERVE AND CUBITAL TUNNEL RELEASE Left 07-14-2009   elbow   EP IMPLANTABLE DEVICE N/A 08/10/2015   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   HOLMIUM LASER APPLICATION Right 09/01/3531   Procedure: HOLMIUM LASER APPLICATION;  Surgeon: Franchot Gallo, MD;  [facility];  Service: Urology;  Laterality: Right;   INGUINAL HERNIA REPAIR Right 1990's   KNEE ARTHROSCOPY Right 2015   LEFT CUBITAL TUNNEL RELEASE     POSTERIOR LUMBAR FUSION  2014   X2   SHOULDER ARTHROSCOPY Left 1990's   TEE WITHOUT CARDIOVERSION N/A 07/27/2015   Procedure: TRANSESOPHAGEAL  ECHOCARDIOGRAM (TEE);  Surgeon: Lelon Perla, MD;  Location: [facility];  Service: Cardiovascular;  Laterality: N/A;   normal LV function, ef 55-60%,  mild AR and MR, mild dilated ascending aorta (4.2cm),  mild to moderate atherosclerosis  descending aorta,  mild to moderate TR,  negative saline microcavitation study   THYROIDECTOMY Right 01/20/2015   Procedure: RIGHT THYROIDECTOMY;  Surgeon: Ascencion Dike, MD;  [facility];  Service: ENT;  Laterality: Right;   TONSILLECTOMY  as child   TOTAL HIP ARTHROPLASTY Left 12/14/2020   Procedure: TOTAL HIP ARTHROPLASTY ANTERIOR APPROACH;  Surgeon: [name], MD;  [facility];  Service: Orthopedics;  Laterality: Left;   TRANSURETHRAL RESECTION OF BLADDER TUMOR N/A 05/15/2016   Procedure: TRANSURETHRAL RESECTION OF BLADDER TUMOR (TURBT) AND INSTILLATION OF EPIRUBICIN;  Surgeon: Franchot Gallo, MD;  Location: [facility];  Service: Urology;  Laterality: N/A;   TRANSURETHRAL RESECTION OF BLADDER TUMOR N/A 03/29/2020   Procedure: TRANSURETHRAL RESECTION OF BLADDER TUMOR (TURBT);  Surgeon: Franchot Gallo, MD;  [facility];  Service: Urology;  Laterality: N/A;    There were no vitals filed for this visit.   Subjective Assessment - 06/21/21 1354     Subjective "sore all over" "thought my back was coming apart". Reports minimal pain at rest.    Currently in Pain? Yes    Pain Score 2     Pain Location Back    Pain Orientation Posterior;Lower    Pain Descriptors / Indicators Aching;Sore    Pain Type Chronic pain                               [facility] Adult PT Treatment/Exercise - 06/21/21 0001       Knee/Hip Exercises: Standing   Heel Raises 20 reps    Heel Raises Limitations toe raise x 20    Hip Flexion Both;2 sets;10 reps    Hip Flexion Limitations toe tapping with minimal hand use 6in step height    Hip Abduction Both;2 sets;10 reps    Hip Extension Both;2 sets;10 reps    Forward Step Up  Both;1 set;15 reps;Hand Hold: 1;Step Height: 6"    Functional Squat 2 sets;10 reps    Other Standing Knee Exercises tandem stance 2x 30 second holds bilateral    Other Standing Knee Exercises sidestepping in // bars 4RT, palloff press GTB x15 each                      PT Short Term Goals - 06/01/21 1224       PT SHORT TERM GOAL #1   Title Patient will be independent with initial HEP and self-management strategies to improve functional outcomes    Time 3  Period Weeks    Status New    Target Date 06/22/21               PT Long Term Goals - 06/01/21 1628       PT LONG TERM GOAL #1   Title Patient will improve FOTO score to predicted value to indicate improvement in functional outcomes    Time 6    Period Weeks    Status New    Target Date 07/13/21      PT LONG TERM GOAL #2   Title Patient will report at least 60% overall improvement in subjective complaint to indicate improvement in ability to perform ADLs.    Time 6    Period Weeks    Status New    Target Date 07/13/21      PT LONG TERM GOAL #3   Title Patient will be able to ambulate at least 300 feet during 2MWT with LRAD to demonstrate improved ability to perform functional mobility and associated tasks.    Time 6    Period Weeks    Status New    Target Date 07/13/21      PT LONG TERM GOAL #4   Title Patient will be able to perform stand x 5 in < 15 seconds to demonstrate improvement in functional mobility and reduced risk for falls.    Time 6    Period Weeks    Status New    Target Date 07/13/21                   Plan - 06/21/21 1425     Clinical Impression Statement Patient tolerated session well today. Mild fatigue with activity, requires several breaks for rest throughout session. Added palloff press for core strength progressions. Patient cued on keeping RT heel down during mini squat. Patient will continue to benefit from skilled therapy services to reduce deficits and improve  functional ability.    Personal Factors and Comorbidities Comorbidity 3+    Examination-Activity Limitations Bed Mobility;Sit;Stairs;Stand;Transfers;Locomotion Level    Examination-Participation Restrictions Yard Work;Community Activity    Stability/Clinical Decision Making Stable/Uncomplicated    Rehab Potential Fair    PT Frequency 2x / week    PT Duration 6 weeks    PT Treatment/Interventions ADLs/Self Care Home Management;Aquatic Therapy;Biofeedback;Cryotherapy;Electrical Stimulation;Therapeutic exercise;Orthotic Fit/Training;Compression bandaging;Scar mobilization;Balance training;Contrast Bath;DME Instruction;Passive range of motion;Visual/perceptual remediation/compensation;Vasopneumatic Device;Taping;Splinting;Manual techniques;Patient/family education;Functional mobility training;Therapeutic activities;Ultrasound;Parrafin;Fluidtherapy;Moist Heat;Iontophoresis 4mg /ml Dexamethasone;Stair training;Gait training;Neuromuscular re-education;Dry needling;Energy conservation;Spinal Manipulations;Joint Manipulations    PT Next Visit Plan Progress functional strengtheing.  Progress gait, balance and core/hip strengthening.  Manual as needed.    PT Home Exercise Plan 06/07/21: ab set, march supine. SLR, bridge, clam; 06/10/21: STS, sidelying ABD, heel/toe raises.6/21 standing hip abd, mini squats    Consulted and Agree with Plan of Care Family member/caregiver    Family Member Consulted Wife             Patient will benefit from skilled therapeutic intervention in order to improve the following deficits and impairments:  Increased fascial restricitons, Decreased range of motion, Abnormal gait, Decreased mobility, Decreased strength, Decreased balance, Decreased activity tolerance, Impaired perceived functional ability, Pain, Difficulty walking, Improper body mechanics, Impaired flexibility  Visit Diagnosis: Difficulty in walking, not elsewhere classified  Pain in left hip  Muscle weakness  (generalized)  Pain in left leg     Problem List Patient Active Problem List   Diagnosis Date Noted   Morning headache 04/04/2021   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   COPD with chronic bronchitis and emphysema ([place]) 09/22/2020   Penile pain 04/07/2020   Balanitis 04/07/2020   Cervical myelopathy ([place]) 04/03/2017   Thoracic ascending aortic aneurysm ([place]) 05/09/2016   Thoracic aortic aneurysm without rupture ([place]) 01/17/2016   Paroxysmal atrial fibrillation ([place]) 12/17/2015   S/P partial thyroidectomy 01/20/2015   2:34 PM, 06/21/21 Josue Hector PT DPT  Physical Therapist with [facility]  [phone] [place] [address] [place], [place], [address] Phone: [phone]   Fax:  [phone]  Name: BENINO KORINEK MRN: [id number] Date of Birth: 04/12/1941

## 2021-06-23 ENCOUNTER — Other Ambulatory Visit: Payer: Self-pay

## 2021-06-23 ENCOUNTER — Encounter (HOSPITAL_COMMUNITY): Payer: Self-pay

## 2021-06-23 ENCOUNTER — Ambulatory Visit (HOSPITAL_COMMUNITY): Payer: Medicare Other

## 2021-06-23 DIAGNOSIS — M6281 Muscle weakness (generalized): Secondary | ICD-10-CM

## 2021-06-23 DIAGNOSIS — R262 Difficulty in walking, not elsewhere classified: Secondary | ICD-10-CM | POA: Diagnosis not present

## 2021-06-23 DIAGNOSIS — M79605 Pain in left leg: Secondary | ICD-10-CM

## 2021-06-23 DIAGNOSIS — M25552 Pain in left hip: Secondary | ICD-10-CM

## 2021-06-23 NOTE — Therapy (Signed)
[place] [address] [place], [place], [address] Phone: [phone]   Fax:  [phone]  Physical Therapy Treatment  Patient Details  Name: Kyle Owen MRN: [id number] Date of Birth: [birth year] Referring Provider (PT): [name] MD   Encounter Date: 06/23/2021   PT End of Session - 06/23/21 1154     Visit Number 7    Number of Visits 12    Date for PT Re-Evaluation 07/13/21    Authorization Type UHC Medicare    Progress Note Due on Visit 10    PT Start Time 1133    PT Stop Time 1212    PT Time Calculation (min) 39 min    Equipment Utilized During Treatment Gait belt    Activity Tolerance Patient limited by fatigue;Patient tolerated treatment well    Behavior During Therapy [facility] for tasks assessed/performed             Past Medical History:  Diagnosis Date   Arthritis    DJD right knee   Benign prostatic hypertrophy with urinary frequency    Bladder cancer ([place])    Complication of anesthesia    gets combative in recovery   COPD with emphysema ([place])    Depression    Dyspnea    when walking   Dysrhythmia    a fib   History of colon polyps    History of kidney stones    History of loop recorder    loop recorder in place battery is dead has not had changed due to covid   History of pneumonia    hx Recurrent -- last bout Dec 2016 CAP-- per pt resolved   History of TIA (transient ischemic attack) no residual per pt   per neurologist note (dr Leta Baptist 11-08-2015) dx cryptogenic TIA versus arrhythia versus dysautonomia (right ophthalmic artery TIA and x3 posterior circulation TIAs   Hyperlipidemia    Hypertension    Multinodular thyroid    per pathology report 11-12-2014 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   Pulmonary nodule, left    left lower lobe x2 per CT 01-20-2016   Renal cyst, left    Stroke [facility])    TIA's   Thoracic aortic aneurysm without rupture ([place])    ascending -- ct chest 01/10/16  4.8cm   Wears hearing aid    bilateral-- wear at times    Past Surgical History:  Procedure Laterality Date   ANTERIOR CERVICAL DECOMP/DISCECTOMY FUSION N/A 04/03/2017   Procedure: Cervical three-four, Cervical four-five Anterior cervical decompression/discectomy/fusion;  Surgeon: Erline Levine, MD;  Location: [place];  Service: Neurosurgery;  Laterality: N/A;   CATARACT EXTRACTION W/ INTRAOCULAR LENS IMPLANT Right 2016   CATARACT EXTRACTION W/PHACO  12/16/2012   Procedure: CATARACT EXTRACTION PHACO AND INTRAOCULAR LENS PLACEMENT ([place]);  Surgeon: Tonny Branch, MD;  [facility];  Service: Ophthalmology;  Laterality: Left;  CDE:17.31   COLONOSCOPY  10/03/2011   Procedure: COLONOSCOPY;  Surgeon: Jamesetta So;  [facility];  Service: Gastroenterology;  Laterality: N/A;   CYSTOSCOPY/RETROGRADE/URETEROSCOPY/STONE EXTRACTION WITH BASKET Right 03/29/2020   Procedure: CYSTOSCOPY/RETROGRADE/URETEROSCOPY/STONE EXTRACTION WITH BASKET;  Surgeon: Franchot Gallo, MD;  [facility];  Service:  Urology;  Laterality: Right;  1 HR   DECOMPRESSIOIN ULNAR NERVE AND CUBITAL TUNNEL RELEASE Left 07-14-2009   elbow   EP IMPLANTABLE DEVICE N/A 08/10/2015   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   HOLMIUM LASER APPLICATION Right 07/30/7618   Procedure: HOLMIUM LASER APPLICATION;  Surgeon: Franchot Gallo, MD;  [facility];  Service: Urology;  Laterality: Right;   INGUINAL HERNIA REPAIR Right 1990's   KNEE ARTHROSCOPY Right 2015   LEFT CUBITAL TUNNEL RELEASE     POSTERIOR LUMBAR FUSION  2014   X2   SHOULDER ARTHROSCOPY Left 1990's   TEE WITHOUT CARDIOVERSION N/A 07/27/2015   Procedure: TRANSESOPHAGEAL  ECHOCARDIOGRAM (TEE);  Surgeon: Lelon Perla, MD;  Location[facility];  Service: Cardiovascular;  Laterality: N/A;   normal LV function, ef 55-60%,  mild AR and MR, mild dilated ascending aorta (4.2cm),  mild to moderate atherosclerosis  descending aorta,  mild to moderate TR,  negative saline microcavitation study   THYROIDECTOMY Right 01/20/2015   Procedure: RIGHT THYROIDECTOMY;  Surgeon: Ascencion Dike, MD;  [facility] OR;  Service: ENT;  Laterality: Right;   TONSILLECTOMY  as child   TOTAL HIP ARTHROPLASTY Left 12/14/2020   Procedure: TOTAL HIP ARTHROPLASTY ANTERIOR APPROACH;  Surgeon: [name], MD;  [facility];  Service: Orthopedics;  Laterality: Left;   TRANSURETHRAL RESECTION OF BLADDER TUMOR N/A 05/15/2016   Procedure: TRANSURETHRAL RESECTION OF BLADDER TUMOR (TURBT) AND INSTILLATION OF EPIRUBICIN;  Surgeon: Franchot Gallo, MD;  Location: [facility];  Service: Urology;  Laterality: N/A;   TRANSURETHRAL RESECTION OF BLADDER TUMOR N/A 03/29/2020   Procedure: TRANSURETHRAL RESECTION OF BLADDER TUMOR (TURBT);  Surgeon: Franchot Gallo, MD;  [facility];  Service: Urology;  Laterality: N/A;    There were no vitals filed for this visit.   Subjective Assessment - 06/23/21 1153     Subjective Pt stated he is fine just standing.  Reports increased pain when rotating hip.    Pertinent History LT THA, lumbar fusion, cervical fusion    Patient Stated Goals Be able to walk without pain/ cane    Currently in Pain? No/denies                               [facility] Adult PT Treatment/Exercise - 06/23/21 0001       Ambulation/Gait   Ambulation/Gait Yes    Ambulation/Gait Assistance 5: Supervision;4: Min guard    Ambulation Distance (Feet) 226 Feet    Assistive device Straight cane    Gait Pattern Decreased arm swing - left;Decreased step length - right;Decreased step length - left;Decreased stride length;Narrow base of support     Ambulation Surface Level;Indoor    Gait velocity decreased    Gait Comments Cueing for increased stride length, heel/toe      Exercises   Exercises Knee/Hip      Knee/Hip Exercises: Standing   Heel Raises 20 reps    Heel Raises Limitations toe raise x 20    Hip Flexion Both;2 sets;10 reps    Hip Flexion Limitations toe tapping with minimal hand use 7in step height    Forward Step Up Both;1 set;15 reps;Hand Hold: 1;Step Height: 6"    Step Down Left;15 reps;Hand Hold: 1;Step Height: 6"    Functional Squat 2 sets;10 reps    Functional Squat Limitations no UE support, chair behind    Other Standing Knee Exercises tandem stance  2x 30 second holds bilateral    Other Standing Knee Exercises sidestep 2RT with GTB around LE; Paloff NBOS on foam 20x 2 GTB                      PT Short Term Goals - 06/01/21 1224       PT SHORT TERM GOAL #1   Title Patient will be independent with initial HEP and self-management strategies to improve functional outcomes    Time 3    Period Weeks    Status New    Target Date 06/22/21               PT Long Term Goals - 06/01/21 1628       PT LONG TERM GOAL #1   Title Patient will improve FOTO score to predicted value to indicate improvement in functional outcomes    Time 6    Period Weeks    Status New    Target Date 07/13/21      PT LONG TERM GOAL #2   Title Patient will report at least 60% overall improvement in subjective complaint to indicate improvement in ability to perform ADLs.    Time 6    Period Weeks    Status New    Target Date 07/13/21      PT LONG TERM GOAL #3   Title Patient will be able to ambulate at least 300 feet during 2MWT with LRAD to demonstrate improved ability to perform functional mobility and associated tasks.    Time 6    Period Weeks    Status New    Target Date 07/13/21      PT LONG TERM GOAL #4   Title Patient will be able to perform stand x 5 in < 15 seconds to demonstrate improvement in  functional mobility and reduced risk for falls.    Time 6    Period Weeks    Status New    Target Date 07/13/21                   Plan - 06/23/21 1317     Clinical Impression Statement Continued session focus with functional strengthening and balance.  Pt continues to require rest breaks through session.  Difficulty with SLS activties.  Improved mechanics with squats, able to complete without HHA this session.  No reports of pain, was limited by fatigue.    Personal Factors and Comorbidities Comorbidity 3+    Examination-Activity Limitations Bed Mobility;Sit;Stairs;Stand;Transfers;Locomotion Level    Examination-Participation Restrictions Yard Work;Community Activity    Stability/Clinical Decision Making Stable/Uncomplicated    Clinical Decision Making Low    Rehab Potential Fair    PT Frequency 2x / week    PT Duration 6 weeks    PT Treatment/Interventions ADLs/Self Care Home Management;Aquatic Therapy;Biofeedback;Cryotherapy;Electrical Stimulation;Therapeutic exercise;Orthotic Fit/Training;Compression bandaging;Scar mobilization;Balance training;Contrast Bath;DME Instruction;Passive range of motion;Visual/perceptual remediation/compensation;Vasopneumatic Device;Taping;Splinting;Manual techniques;Patient/family education;Functional mobility training;Therapeutic activities;Ultrasound;Parrafin;Fluidtherapy;Moist Heat;Iontophoresis 4mg /ml Dexamethasone;Stair training;Gait training;Neuromuscular re-education;Dry needling;Energy conservation;Spinal Manipulations;Joint Manipulations    PT Next Visit Plan Progress functional strengtheing.  Progress gait, balance and core/hip strengthening.  Manual as needed.    PT Home Exercise Plan 06/07/21: ab set, march supine. SLR, bridge, clam; 06/10/21: STS, sidelying ABD, heel/toe raises.6/21 standing hip abd, mini squats    Consulted and Agree with Plan of Care Family member/caregiver    Family Member Consulted Wife             Patient will  benefit from skilled therapeutic intervention in  order to improve the following deficits and impairments:  Increased fascial restricitons, Decreased range of motion, Abnormal gait, Decreased mobility, Decreased strength, Decreased balance, Decreased activity tolerance, Impaired perceived functional ability, Pain, Difficulty walking, Improper body mechanics, Impaired flexibility  Visit Diagnosis: Difficulty in walking, not elsewhere classified  Pain in left hip  Muscle weakness (generalized)  Pain in left leg     Problem List Patient Active Problem List   Diagnosis Date Noted   Morning headache 04/04/2021   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   COPD with chronic bronchitis and emphysema ([place]) 09/22/2020   Penile pain 04/07/2020   Balanitis 04/07/2020   Cervical myelopathy ([place]) 04/03/2017   Thoracic ascending aortic aneurysm ([place]) 05/09/2016   Thoracic aortic aneurysm without rupture ([place]) 01/17/2016   Paroxysmal atrial fibrillation ([place]) 12/17/2015   S/P partial thyroidectomy 01/20/2015   Ihor Austin, LPTA/CLT; CBIS [phone]  Aldona Lento 06/23/2021, 1:26 PM  [place] [address] [place], [place], [address] Phone: [phone]   Fax:  [phone]  Name: Kyle Owen MRN: [id number] Date of Birth: October 26, 1941

## 2021-06-28 ENCOUNTER — Encounter (HOSPITAL_COMMUNITY): Payer: Self-pay | Admitting: Physical Therapy

## 2021-06-28 ENCOUNTER — Other Ambulatory Visit: Payer: Self-pay

## 2021-06-28 ENCOUNTER — Ambulatory Visit (HOSPITAL_COMMUNITY): Payer: Medicare Other | Attending: Orthopedic Surgery | Admitting: Physical Therapy

## 2021-06-28 DIAGNOSIS — M79605 Pain in left leg: Secondary | ICD-10-CM | POA: Diagnosis present

## 2021-06-28 DIAGNOSIS — M25552 Pain in left hip: Secondary | ICD-10-CM | POA: Diagnosis present

## 2021-06-28 DIAGNOSIS — R262 Difficulty in walking, not elsewhere classified: Secondary | ICD-10-CM | POA: Diagnosis not present

## 2021-06-28 DIAGNOSIS — M6281 Muscle weakness (generalized): Secondary | ICD-10-CM | POA: Insufficient documentation

## 2021-06-28 NOTE — Therapy (Signed)
[place], [place], [address] Phone: [phone]   Fax:  [phone]  Physical Therapy Treatment/Progress Note  Patient Details  Name: Kyle Owen MRN: [id number] Date of Birth: 1941-01-15 Referring Provider (PT): [name] MD   Encounter Date: 06/28/2021  Progress Note   Reporting Period 06/01/21 to 06/28/21   See note below for Objective Data and Assessment of Progress/Goals    PT End of Session - 06/28/21 1446     Visit Number 8    Number of Visits 12    Date for PT Re-Evaluation 07/13/21    Authorization Type UHC Medicare    Progress Note Due on Visit 18    PT Start Time 1446    PT Stop Time 1526    PT Time Calculation (min) 40 min    Equipment Utilized During Treatment Gait belt    Activity Tolerance Patient limited by fatigue;Patient tolerated treatment well    Behavior During Therapy [facility] for tasks assessed/performed             Past Medical History:  Diagnosis Date   Arthritis    DJD right knee   Benign prostatic hypertrophy with urinary frequency    Bladder cancer ([place])    Complication of anesthesia    gets combative in recovery   COPD with emphysema ([place])    Depression    Dyspnea    when walking   Dysrhythmia    a fib   History of colon polyps    History of kidney stones    History of loop recorder    loop recorder in place battery is dead has not had changed due to covid   History of pneumonia    hx Recurrent -- last bout Dec 2016 CAP-- per pt resolved   History of TIA (transient ischemic attack) no residual per pt   per neurologist note (dr Leta Baptist 11-08-2015) dx cryptogenic TIA versus arrhythia versus dysautonomia (right ophthalmic artery TIA and x3 posterior circulation TIAs   Hyperlipidemia    Hypertension    Multinodular thyroid    per pathology report 11-12-2014 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   Pulmonary nodule, left    left lower lobe x2 per CT 01-20-2016   Renal cyst, left    Stroke [facility])    TIA's   Thoracic aortic aneurysm without rupture ([place])    ascending -- ct chest 01/10/16  4.8cm   Wears hearing aid    bilateral-- wear at times    Past Surgical History:  Procedure Laterality Date   ANTERIOR CERVICAL DECOMP/DISCECTOMY FUSION N/A 04/03/2017   Procedure: Cervical three-four, Cervical four-five Anterior cervical decompression/discectomy/fusion;  Surgeon: Erline Levine, MD;  Location: [place];  Service: Neurosurgery;  Laterality: N/A;   CATARACT EXTRACTION W/ INTRAOCULAR LENS IMPLANT Right 2016   CATARACT EXTRACTION W/PHACO  12/16/2012   Procedure: CATARACT EXTRACTION PHACO AND INTRAOCULAR LENS PLACEMENT ([place]);  Surgeon: Tonny Branch, MD;  [facility];  Service: Ophthalmology;  Laterality: Left;  CDE:17.31   COLONOSCOPY  10/03/2011   Procedure: COLONOSCOPY;  Surgeon: Jamesetta So;  [facility];  Service: Gastroenterology;  Laterality: N/A;  CYSTOSCOPY/RETROGRADE/URETEROSCOPY/STONE EXTRACTION WITH BASKET Right 03/29/2020   Procedure: CYSTOSCOPY/RETROGRADE/URETEROSCOPY/STONE EXTRACTION WITH BASKET;  Surgeon: Franchot Gallo, MD;  [facility];  Service: Urology;  Laterality: Right;  1 HR   DECOMPRESSIOIN ULNAR NERVE AND CUBITAL TUNNEL RELEASE Left 07-14-2009   elbow   EP IMPLANTABLE DEVICE N/A 08/10/2015   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   HOLMIUM LASER APPLICATION Right 12/27/1436   Procedure: HOLMIUM LASER APPLICATION;  Surgeon: Franchot Gallo, MD;  [facility];  Service: Urology;  Laterality: Right;   INGUINAL HERNIA REPAIR Right 1990's   KNEE ARTHROSCOPY Right 2015   LEFT CUBITAL TUNNEL RELEASE     POSTERIOR  LUMBAR FUSION  2014   X2   SHOULDER ARTHROSCOPY Left 1990's   TEE WITHOUT CARDIOVERSION N/A 07/27/2015   Procedure: TRANSESOPHAGEAL ECHOCARDIOGRAM (TEE);  Surgeon: Lelon Perla, MD;  Location: [facility] END[facility]COPY;  Service: Cardiovascular;  Laterality: N/A;   normal LV function, ef 55-60%,  mild AR and MR, mild dilated ascending aorta (4.2cm),  mild to moderate atherosclerosis  descending aorta,  mild to moderate TR,  negative saline microcavitation study   THYROIDECTOMY Right 01/20/2015   Procedure: RIGHT THYROIDECTOMY;  Surgeon: Ascencion Dike, MD;  Location: [facility] OR;  Service: ENT;  Laterality: Right;   TONSILLECTOMY  as child   TOTAL HIP ARTHROPLASTY Left 12/14/2020   Procedure: TOTAL HIP ARTHROPLASTY ANTERIOR APPROACH;  Surgeon: [name], MD;  [facility];  Service: Orthopedics;  Laterality: Left;   TRANSURETHRAL RESECTION OF BLADDER TUMOR N/A 05/15/2016   Procedure: TRANSURETHRAL RESECTION OF BLADDER TUMOR (TURBT) AND INSTILLATION OF EPIRUBICIN;  Surgeon: Franchot Gallo, MD;  Location[facility];  Service: Urology;  Laterality: N/A;   TRANSURETHRAL RESECTION OF BLADDER TUMOR N/A 03/29/2020   Procedure: TRANSURETHRAL RESECTION OF BLADDER TUMOR (TURBT);  Surgeon: Franchot Gallo, MD;  [facility];  Service: Urology;  Laterality: N/A;    There were no vitals filed for this visit.   Subjective Assessment - 06/28/21 1447     Subjective Patient states he feel likes he is getting better. His home exercises are going well.    Pertinent History LT THA, lumbar fusion, cervical fusion    Patient Stated Goals Be able to walk without pain/ cane                [facility] PT Assessment - 06/28/21 0001       Assessment   Medical Diagnosis Lumbago s/p LT THA    Referring Provider (PT) [name] MD    Onset Date/Surgical Date 12/14/20    Prior Therapy Yes      Precautions   Precautions Fall      Restrictions   Weight Bearing Restrictions No      Balance  Screen   Has the patient fallen in the past 6 months Yes    How many times? 3    Has the patient had a decrease in activity level because of a fear of falling?  Yes    Is the patient reluctant to leave their home because of a fear of falling?  No      Prior Function   Level of Independence Needs assistance with ADLs      Cognition   Overall Cognitive Status Within Functional Limits for tasks assessed      Observation/Other Assessments   Observations antaligic gait, trunk flexed with SPC      Strength   Right Hip Flexion 5/5  Left Hip Flexion 5/5    Right Knee Flexion 5/5    Right Knee Extension 4+/5    Left Knee Flexion 5/5    Left Knee Extension 4+/5      Transfers   Five time sit to stand comments  14.4 seconds without UE use      Ambulation/Gait   Ambulation/Gait Yes    Ambulation/Gait Assistance 6: Modified independent (Device/Increase time)    Ambulation Distance (Feet) 250 Feet    Assistive device Straight cane    Gait Pattern Left foot flat   decreased hip extension   Gait Comments 2MWT, improving walking mechanics                           [facility] Adult PT Treatment/Exercise - 06/28/21 0001       Knee/Hip Exercises: Standing   Lateral Step Up Both;15 reps;Hand Hold: 1;Step Height: 6";2 sets    Forward Step Up Both;1 set;15 reps;Hand Hold: 1;Step Height: 6"    Other Standing Knee Exercises sidestep 3RT with GTB around LE; [place] NBOS on foam 20x 2 GTB                    PT Education - 06/28/21 1447     Education Details HEP    Person(s) Educated Patient    Methods Explanation    Comprehension Verbalized understanding              PT Short Term Goals - 06/28/21 1501       PT SHORT TERM GOAL #1   Title Patient will be independent with initial HEP and self-management strategies to improve functional outcomes    Time 3    Period Weeks    Status Achieved    Target Date 06/22/21               PT Long Term Goals -  06/28/21 1501       PT LONG TERM GOAL #1   Title Patient will improve FOTO score to predicted value to indicate improvement in functional outcomes    Time 6    Period Weeks    Status On-going      PT LONG TERM GOAL #2   Title Patient will report at least 60% overall improvement in subjective complaint to indicate improvement in ability to perform ADLs.    Time 6    Period Weeks    Status On-going      PT LONG TERM GOAL #3   Title Patient will be able to ambulate at least 300 feet during 2MWT with LRAD to demonstrate improved ability to perform functional mobility and associated tasks.    Time 6    Period Weeks    Status On-going      PT LONG TERM GOAL #4   Title Patient will be able to perform stand x 5 in < 15 seconds to demonstrate improvement in functional mobility and reduced risk for falls.    Time 6    Period Weeks    Status Achieved                   Plan - 06/28/21 1446     Clinical Impression Statement Patient has met 1/1 short term goals and  long term goals with ability to complete HEP and improved functional strength with STS test. Remaining goals not met due to continued symptoms and impaired gait, strength and functional mobility. Patient demonstrating improving  strength and gait but continues to be limited with glute strength and balance. Patient will continue to benefit from skilled physical therapy in order to reduce impairment and improve function.    Personal Factors and Comorbidities Comorbidity 3+    Examination-Activity Limitations Bed Mobility;Sit;Stairs;Stand;Transfers;Locomotion Level    Examination-Participation Restrictions Yard Work;Community Activity    Stability/Clinical Decision Making Stable/Uncomplicated    Rehab Potential Fair    PT Frequency 2x / week    PT Duration 6 weeks    PT Treatment/Interventions ADLs/Self Care Home Management;Aquatic Therapy;Biofeedback;Cryotherapy;Electrical Stimulation;Therapeutic exercise;Orthotic  Fit/Training;Compression bandaging;Scar mobilization;Balance training;Contrast Bath;DME Instruction;Passive range of motion;Visual/perceptual remediation/compensation;Vasopneumatic Device;Taping;Splinting;Manual techniques;Patient/family education;Functional mobility training;Therapeutic activities;Ultrasound;Parrafin;Fluidtherapy;Moist Heat;Iontophoresis 45m/ml Dexamethasone;Stair training;Gait training;Neuromuscular re-education;Dry needling;Energy conservation;Spinal Manipulations;Joint Manipulations    PT Next Visit Plan Progress functional strengtheing.  Progress gait, balance and core/hip strengthening.  Manual as needed.    PT Home Exercise Plan 06/07/21: ab set, march supine. SLR, bridge, clam; 06/10/21: STS, sidelying ABD, heel/toe raises.6/21 standing hip abd, mini squats    Consulted and Agree with Plan of Care Family member/caregiver    Family Member Consulted Wife             Patient will benefit from skilled therapeutic intervention in order to improve the following deficits and impairments:  Increased fascial restricitons, Decreased range of motion, Abnormal gait, Decreased mobility, Decreased strength, Decreased balance, Decreased activity tolerance, Impaired perceived functional ability, Pain, Difficulty walking, Improper body mechanics, Impaired flexibility  Visit Diagnosis: Difficulty in walking, not elsewhere classified  Pain in left hip  Muscle weakness (generalized)  Pain in left leg     Problem List Patient Active Problem List   Diagnosis Date Noted   Morning headache 04/04/2021   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

## 2021-06-30 ENCOUNTER — Ambulatory Visit (HOSPITAL_COMMUNITY): Payer: Medicare Other | Admitting: Physical Therapy

## 2021-06-30 ENCOUNTER — Other Ambulatory Visit: Payer: Self-pay

## 2021-06-30 DIAGNOSIS — R262 Difficulty in walking, not elsewhere classified: Secondary | ICD-10-CM | POA: Diagnosis not present

## 2021-06-30 DIAGNOSIS — M6281 Muscle weakness (generalized): Secondary | ICD-10-CM

## 2021-06-30 DIAGNOSIS — M25552 Pain in left hip: Secondary | ICD-10-CM

## 2021-06-30 DIAGNOSIS — M79605 Pain in left leg: Secondary | ICD-10-CM

## 2021-06-30 NOTE — Therapy (Signed)
[facility] [address] [place], [place], [address] Phone: [phone]   Fax:  [phone]  Physical Therapy Treatment  Patient Details  Name: Kyle Owen MRN: [id number] Date of Birth: 04-19-1941 Referring Provider (PT): [name] MD   Encounter Date: 06/30/2021   PT End of Session - 06/30/21 1439     Visit Number 9    Number of Visits 12    Date for PT Re-Evaluation 07/13/21    Authorization Type UHC Medicare    Progress Note Due on Visit 18    PT Start Time 1401    PT Stop Time 1443    PT Time Calculation (min) 42 min    Equipment Utilized During Treatment Gait belt    Activity Tolerance Patient limited by fatigue;Patient tolerated treatment well    Behavior During Therapy [facility] for tasks assessed/performed             Past Medical History:  Diagnosis Date   Arthritis    DJD right knee   Benign prostatic hypertrophy with urinary frequency    Bladder cancer ([place])    Complication of anesthesia    gets combative in recovery   COPD with emphysema ([place])    Depression    Dyspnea    when walking   Dysrhythmia    a fib   History of colon polyps    History of kidney stones    History of loop recorder    loop recorder in place battery is dead has not had changed due to covid   History of pneumonia    hx Recurrent -- last bout Dec 2016 CAP-- per pt resolved   History of TIA (transient ischemic attack) no residual per pt   per neurologist note (dr Leta Baptist 11-08-2015) dx cryptogenic TIA versus arrhythia versus dysautonomia (right ophthalmic artery TIA and x3 posterior circulation TIAs   Hyperlipidemia    Hypertension    Multinodular thyroid    per pathology report 11-12-2014 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   Pulmonary nodule, left    left lower lobe x2 per CT 01-20-2016   Renal cyst, left    Stroke [facility])    TIA's   Thoracic aortic aneurysm without rupture ([place])    ascending -- ct chest 01/10/16  4.8cm   Wears hearing aid    bilateral-- wear at times    Past Surgical History:  Procedure Laterality Date   ANTERIOR CERVICAL DECOMP/DISCECTOMY FUSION N/A 04/03/2017   Procedure: Cervical three-four, Cervical four-five Anterior cervical decompression/discectomy/fusion;  Surgeon: Erline Levine, MD;  Location: [place];  Service: Neurosurgery;  Laterality: N/A;   CATARACT EXTRACTION W/ INTRAOCULAR LENS IMPLANT Right 2016   CATARACT EXTRACTION W/PHACO  12/16/2012   Procedure: CATARACT EXTRACTION PHACO AND INTRAOCULAR LENS PLACEMENT ([place]);  Surgeon: Tonny Branch, MD;  [facility];  Service: Ophthalmology;  Laterality: Left;  CDE:17.31   COLONOSCOPY  10/03/2011   Procedure: COLONOSCOPY;  Surgeon: Jamesetta So;  [facility];  Service: Gastroenterology;  Laterality: N/A;   CYSTOSCOPY/RETROGRADE/URETEROSCOPY/STONE EXTRACTION WITH BASKET Right 03/29/2020   Procedure: CYSTOSCOPY/RETROGRADE/URETEROSCOPY/STONE EXTRACTION WITH BASKET;  Surgeon: Franchot Gallo, MD;  [facility];  Service:  Urology;  Laterality: Right;  1 HR   DECOMPRESSIOIN ULNAR NERVE AND CUBITAL TUNNEL RELEASE Left 07-14-2009   elbow   EP IMPLANTABLE DEVICE N/A 08/10/2015   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   HOLMIUM LASER APPLICATION Right 0/01/7252   Procedure: HOLMIUM LASER APPLICATION;  Surgeon: Franchot Gallo, MD;  [facility];  Service: Urology;  Laterality: Right;   INGUINAL HERNIA REPAIR Right 1990's   KNEE ARTHROSCOPY Right 2015   LEFT CUBITAL TUNNEL RELEASE     POSTERIOR LUMBAR FUSION  2014   X2   SHOULDER ARTHROSCOPY Left 1990's   TEE WITHOUT CARDIOVERSION N/A 07/27/2015   Procedure: TRANSESOPHAGEAL  ECHOCARDIOGRAM (TEE);  Surgeon: Lelon Perla, MD;  Location: [facility] END[facility]COPY;  Service: Cardiovascular;  Laterality: N/A;   normal LV function, ef 55-60%,  mild AR and MR, mild dilated ascending aorta (4.2cm),  mild to moderate atherosclerosis  descending aorta,  mild to moderate TR,  negative saline microcavitation study   THYROIDECTOMY Right 01/20/2015   Procedure: RIGHT THYROIDECTOMY;  Surgeon: Ascencion Dike, MD;  [facility];  Service: ENT;  Laterality: Right;   TONSILLECTOMY  as child   TOTAL HIP ARTHROPLASTY Left 12/14/2020   Procedure: TOTAL HIP ARTHROPLASTY ANTERIOR APPROACH;  Surgeon: [name], MD;  [facility];  Service: Orthopedics;  Laterality: Left;   TRANSURETHRAL RESECTION OF BLADDER TUMOR N/A 05/15/2016   Procedure: TRANSURETHRAL RESECTION OF BLADDER TUMOR (TURBT) AND INSTILLATION OF EPIRUBICIN;  Surgeon: Franchot Gallo, MD;  Location: [facility];  Service: Urology;  Laterality: N/A;   TRANSURETHRAL RESECTION OF BLADDER TUMOR N/A 03/29/2020   Procedure: TRANSURETHRAL RESECTION OF BLADDER TUMOR (TURBT);  Surgeon: Franchot Gallo, MD;  [facility];  Service: Urology;  Laterality: N/A;    There were no vitals filed for this visit.   Subjective Assessment - 06/30/21 1401     Subjective PT states that it varies what bothers him the most.  If he is still his back hurts when moving his hip hurts the most.    Pertinent History LT THA, lumbar fusion, cervical fusion    Patient Stated Goals Be able to walk without pain/ cane    Currently in Pain? Yes    Pain Score 6     Pain Location Hip    Pain Orientation Left    Pain Descriptors / Indicators Burning;Constant    Pain Onset More than a month ago    Pain Frequency Constant    Aggravating Factors  walking    Pain Relieving Factors laying down                               [facility] Adult PT Treatment/Exercise - 06/30/21 0001       Exercises   Exercises Lumbar      Lumbar  Exercises: Stretches   Standing Side Bend Right;Left;5 reps    Standing Extension 5 reps      Lumbar Exercises: Standing   Heel Raises 10 reps    Other Standing Lumbar Exercises marchingx 10      Lumbar Exercises: Seated   Sit to Stand 10 reps    Other Seated Lumbar Exercises tall sitting x 10      Lumbar Exercises: Supine   Bridge 10 reps    Other Supine Lumbar Exercises side step with green t band x3 Rt    Other Supine Lumbar Exercises decompression 1-5 x5  PT Short Term Goals - 06/28/21 1501       PT SHORT TERM GOAL #1   Title Patient will be independent with initial HEP and self-management strategies to improve functional outcomes    Time 3    Period Weeks    Status Achieved    Target Date 06/22/21               PT Long Term Goals - 06/28/21 1501       PT LONG TERM GOAL #1   Title Patient will improve FOTO score to predicted value to indicate improvement in functional outcomes    Time 6    Period Weeks    Status On-going      PT LONG TERM GOAL #2   Title Patient will report at least 60% overall improvement in subjective complaint to indicate improvement in ability to perform ADLs.    Time 6    Period Weeks    Status On-going      PT LONG TERM GOAL #3   Title Patient will be able to ambulate at least 300 feet during 2MWT with LRAD to demonstrate improved ability to perform functional mobility and associated tasks.    Time 6    Period Weeks    Status On-going      PT LONG TERM GOAL #4   Title Patient will be able to perform stand x 5 in < 15 seconds to demonstrate improvement in functional mobility and reduced risk for falls.    Time 6    Period Weeks    Status Achieved                   Plan - 06/30/21 1440     Clinical Impression Statement Pt continues to have both hip and back pain.  Added decompression and postural exercises.    Personal Factors and Comorbidities Comorbidity 3+    Examination-Activity  Limitations Bed Mobility;Sit;Stairs;Stand;Transfers;Locomotion Level    Examination-Participation Restrictions Yard Work;Community Activity    Stability/Clinical Decision Making Stable/Uncomplicated    Rehab Potential Fair    PT Frequency 2x / week    PT Duration 6 weeks    PT Treatment/Interventions ADLs/Self Care Home Management;Aquatic Therapy;Biofeedback;Cryotherapy;Electrical Stimulation;Therapeutic exercise;Orthotic Fit/Training;Compression bandaging;Scar mobilization;Balance training;Contrast Bath;DME Instruction;Passive range of motion;Visual/perceptual remediation/compensation;Vasopneumatic Device;Taping;Splinting;Manual techniques;Patient/family education;Functional mobility training;Therapeutic activities;Ultrasound;Parrafin;Fluidtherapy;Moist Heat;Iontophoresis 4mg /ml Dexamethasone;Stair training;Gait training;Neuromuscular re-education;Dry needling;Energy conservation;Spinal Manipulations;Joint Manipulations    PT Next Visit Plan Progress functional strengtheing.  Progress gait, balance and core/hip strengthening.  Manual as needed.    PT Home Exercise Plan 06/07/21: ab set, march supine. SLR, bridge, clam; 06/10/21: STS, sidelying ABD, heel/toe raises.6/21 standing hip abd, mini squats; 7/7: decompression exercises posture    Consulted and Agree with Plan of Care Family member/caregiver    Family Member Consulted Wife             Patient will benefit from skilled therapeutic intervention in order to improve the following deficits and impairments:  Increased fascial restricitons, Decreased range of motion, Abnormal gait, Decreased mobility, Decreased strength, Decreased balance, Decreased activity tolerance, Impaired perceived functional ability, Pain, Difficulty walking, Improper body mechanics, Impaired flexibility  Visit Diagnosis: Difficulty in walking, not elsewhere classified  Pain in left hip  Muscle weakness (generalized)  Pain in left leg     Problem List Patient  Active Problem List   Diagnosis Date Noted   Morning headache 04/04/2021   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 [place], [place], [address] Phone: [phone]   Fax:  [phone]  Name: BRODI KARI MRN: [id number] Date of Birth: 1941/05/30

## 2021-07-04 NOTE — Progress Notes (Signed)
History of Present Illness:   4.5.20221-Underwent TURBT/Rt URS/stone mgmt.   Initial TURBT of a 2 cm papillary bladder tumor on 5.22.2017.  Pathology revealed papillary low-grade NMIBC. Epirubicin was placed post resection.   4.5.2021: He underwent cystoscopy, biopsy of papillary lesions at the right ureteral orifice (path--urothelial papilloma), right ureteroscopy with holmium laser and extraction of stone as well as stent placement.  He has since remove the stent.  He has passed multiple small fragments.    7.12.2022: No issues w/ stones, no blood in urine since April 2021.  IPSS 12  QoL score 1  On finasteride  Past Medical History:  Diagnosis Date   Arthritis    DJD right knee   Benign prostatic hypertrophy with urinary frequency    Bladder cancer (HCC)    Complication of anesthesia    gets combative in recovery   COPD with emphysema (HCC)    Depression    Dyspnea    when walking   Dysrhythmia    a fib   History of colon polyps    History of kidney stones    History of loop recorder    loop recorder in place battery is dead has not had changed due to covid   History of pneumonia    hx Recurrent -- last bout Dec 2016 CAP-- per pt resolved   History of TIA (transient ischemic attack) no residual per pt   per neurologist note (dr Leta Baptist 11-08-2015) dx cryptogenic TIA versus arrhythia versus dysautonomia (right ophthalmic artery TIA and x3 posterior circulation TIAs   Hyperlipidemia    Hypertension    Multinodular thyroid    per pathology report 11-12-2014 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   Pulmonary nodule, left    left lower lobe x2 per CT 01-20-2016   Renal cyst, left    Stroke [facility])     TIA's   Thoracic aortic aneurysm without rupture ([place])    ascending -- ct chest 01/10/16  4.8cm   Wears hearing aid    bilateral-- wear at times    Past Surgical History:  Procedure Laterality Date   ANTERIOR CERVICAL DECOMP/DISCECTOMY FUSION N/A 04/03/2017   Procedure: Cervical three-four, Cervical four-five Anterior cervical decompression/discectomy/fusion;  Surgeon: Erline Levine, MD;  Location: [place];  Service: Neurosurgery;  Laterality: N/A;   CATARACT EXTRACTION W/ INTRAOCULAR LENS IMPLANT Right 2016   CATARACT EXTRACTION W/PHACO  12/16/2012   Procedure: CATARACT EXTRACTION PHACO AND INTRAOCULAR LENS PLACEMENT ([place]);  Surgeon: Tonny Branch, MD;  [facility];  Service: Ophthalmology;  Laterality: Left;  CDE:17.31   COLONOSCOPY  10/03/2011   Procedure: COLONOSCOPY;  Surgeon: Jamesetta So;  [facility];  Service: Gastroenterology;  Laterality: N/A;   CYSTOSCOPY/RETROGRADE/URETEROSCOPY/STONE EXTRACTION WITH BASKET Right 03/29/2020   Procedure: CYSTOSCOPY/RETROGRADE/URETEROSCOPY/STONE EXTRACTION WITH BASKET;  Surgeon: Franchot Gallo, MD;  [facility];  Service: Urology;  Laterality: Right;  1 HR   DECOMPRESSIOIN ULNAR NERVE AND CUBITAL TUNNEL RELEASE Left 07-14-2009   elbow   EP IMPLANTABLE DEVICE N/A 08/10/2015   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   HOLMIUM LASER APPLICATION Right 06/30/9389   Procedure: HOLMIUM LASER APPLICATION;  Surgeon: Franchot Gallo, MD;  [facility];  Service: Urology;  Laterality: Right;   INGUINAL HERNIA REPAIR Right 1990's   KNEE ARTHROSCOPY Right 2015   LEFT CUBITAL TUNNEL RELEASE     POSTERIOR LUMBAR FUSION  2014   X2   SHOULDER ARTHROSCOPY Left 1990's   TEE WITHOUT CARDIOVERSION N/A 07/27/2015   Procedure: TRANSESOPHAGEAL ECHOCARDIOGRAM (TEE);  Surgeon: Lelon Perla, MD;  Location: [facility] ENDOSCOPY;  Service: Cardiovascular;  Laterality: N/A;   normal LV function, ef 55-60%,  mild AR and MR, mild dilated  ascending aorta (4.2cm),  mild to moderate atherosclerosis  descending aorta,  mild to moderate TR,  negative saline microcavitation study   THYROIDECTOMY Right 01/20/2015   Procedure: RIGHT THYROIDECTOMY;  Surgeon: Ascencion Dike, MD;  Location: [facility] OR;  Service: ENT;  Laterality: Right;   TONSILLECTOMY  as child   TOTAL HIP ARTHROPLASTY Left 12/14/2020   Procedure: TOTAL HIP ARTHROPLASTY ANTERIOR APPROACH;  Surgeon: [name], MD;  [facility];  Service: Orthopedics;  Laterality: Left;   TRANSURETHRAL RESECTION OF BLADDER TUMOR N/A 05/15/2016   Procedure: TRANSURETHRAL RESECTION OF BLADDER TUMOR (TURBT) AND INSTILLATION OF EPIRUBICIN;  Surgeon: Franchot Gallo, MD;  Location: [facility];  Service: Urology;  Laterality: N/A;   TRANSURETHRAL RESECTION OF BLADDER TUMOR N/A 03/29/2020   Procedure: TRANSURETHRAL RESECTION OF BLADDER TUMOR (TURBT);  Surgeon: Franchot Gallo, MD;  [facility];  Service: Urology;  Laterality: N/A;    Home Medications:  Allergies as of 07/05/2021       Reactions   Flomax [tamsulosin Hcl] Nausea And Vomiting, Other (See Comments)   "Pt thought he was dying " DIZZY   Fentanyl    Makes me combative         Medication List        Accurate as of July 04, 2021  6:34 PM. If you have any questions, ask your nurse or doctor.          acetaminophen 500 MG tablet Commonly known as: TYLENOL Take 2 tablets (1,000 mg total) by mouth every 6 (six) hours.   atorvastatin 20 MG tablet Commonly known as: LIPITOR TAKE 1 TABLET BY MOUTH DAILY. What changed: how much to take   SunGard 160-9-4.8 MCG/ACT Aero Generic drug: Budeson-Glycopyrrol-Formoterol Inhale 2 puffs into the lungs in the morning and at bedtime.   budesonide-formoterol 160-4.5 MCG/ACT inhaler Commonly known as: SYMBICORT Inhale 2 puffs into the lungs 2 (two) times daily.   Eliquis 5 MG Tabs tablet Generic drug: apixaban TAKE (1) TABLET BY MOUTH TWICE  DAILY. What changed: See the new instructions.   finasteride 5 MG tablet Commonly known as: PROSCAR Take 5 mg by mouth daily.   loratadine 10 MG tablet Commonly known as: CLARITIN Take 10 mg by mouth daily as needed for allergies.   Magnesium 250 MG Tabs Take 500 mg by mouth daily.   OVER THE COUNTER MEDICATION Apply 1 application topically daily as needed (Joint pain). CBD cream   sertraline 50 MG tablet Commonly known as: ZOLOFT Take 50 mg by mouth every morning.   Ventolin HFA 108 (90 Base) MCG/ACT inhaler Generic drug: albuterol Inhale 2 puffs into the lungs Every 4 hours as needed for wheezing or shortness of breath.   verapamil 240 MG CR tablet Commonly known as: CALAN-SR Take 240 mg by mouth daily.        Allergies:  Allergies  Allergen Reactions   Flomax [Tamsulosin Hcl] Nausea And Vomiting and Other (See Comments)    "  Pt thought he was dying " DIZZY   Fentanyl     Makes me combative     Family History  Problem Relation Age of Onset   Stroke Sister    Breast cancer Sister    Stroke Brother     Social History:  reports that he quit smoking about 10 years ago. His smoking use included cigarettes. He has a 45.00 pack-year smoking history. He has never used smokeless tobacco. He reports that he does not drink alcohol and does not use drugs.  ROS: A complete review of systems was performed.  All systems are negative except for pertinent findings as noted.  Physical Exam:  Vital signs in last 24 hours: There were no vitals taken for this visit. Constitutional:  Alert and oriented, No acute distress Cardiovascular: Regular rate  Respiratory: Normal respiratory effort GI: Abdomen is soft, nontender, nondistended, no abdominal masses. No CVAT.  Genitourinary: Normal male phallus, testes are descended bilaterally and non-tender and without masses, scrotum is normal in appearance without lesions or masses, perineum is normal on inspection. Lymphatic: No  lymphadenopathy Neurologic: Grossly intact, no focal deficits Psychiatric: Normal mood and affect  I have reviewed prior pt notes  I have reviewed notes from referring/previous physicians  I have reviewed urinalysis results  I have independently reviewed prior imaging  Cystoscopy Procedure Note:  Indication: Bladder cancer surveillance  After informed consent and discussion of the procedure and its risks, OMARIE PARCELL was positioned and prepped in the standard fashion.  Cystoscopy was performed with a flexible cystoscope.   Findings: Urethra:normal Prostate:Minimal obstruction--biloabr hypertrophy Bladder neck: normal Ureteral orifices:Lt capacious Bladder:no lesions/stones  The patient tolerated the procedure well.     Impression/Assessment:  H/O bladder cancer  2. BPH--sx's stable on med Rx  3. H/O urolithiasis--no sx's recently  Plan:  I'll see back in a year for cyst  Continue finasteride  Stone prevention diet discussed

## 2021-07-05 ENCOUNTER — Ambulatory Visit: Payer: Medicare Other | Admitting: Urology

## 2021-07-05 ENCOUNTER — Encounter: Payer: Self-pay | Admitting: Urology

## 2021-07-05 ENCOUNTER — Other Ambulatory Visit: Payer: Self-pay

## 2021-07-05 VITALS — BP 144/80 | Temp 98.2°F | Wt 223.8 lb

## 2021-07-05 DIAGNOSIS — N200 Calculus of kidney: Secondary | ICD-10-CM

## 2021-07-05 DIAGNOSIS — C678 Malignant neoplasm of overlapping sites of bladder: Secondary | ICD-10-CM | POA: Diagnosis not present

## 2021-07-05 LAB — URINALYSIS, ROUTINE W REFLEX MICROSCOPIC
Bilirubin, UA: NEGATIVE
Glucose, UA: NEGATIVE
Ketones, UA: NEGATIVE
Leukocytes,UA: NEGATIVE
Nitrite, UA: NEGATIVE
Protein,UA: NEGATIVE
RBC, UA: NEGATIVE
Specific Gravity, UA: 1.02 (ref 1.005–1.030)
Urobilinogen, Ur: 0.2 mg/dL (ref 0.2–1.0)
pH, UA: 6.5 (ref 5.0–7.5)

## 2021-07-05 MED ORDER — CIPROFLOXACIN HCL 500 MG PO TABS
500.0000 mg | ORAL_TABLET | Freq: Once | ORAL | Status: AC
  Administered 2021-07-05: 500 mg via ORAL

## 2021-07-05 NOTE — Progress Notes (Signed)
Urological Symptom Review  Patient is experiencing the following symptoms: Frequent urination Hard to postpone urination Get up at night to urinate   Review of Systems  Gastrointestinal (upper)  : Negative for upper GI symptoms  Gastrointestinal (lower) : Negative for lower GI symptoms  Constitutional : Fatigue  Skin: Negative for skin symptoms  Eyes: Blurred vision  Ear/Nose/Throat : Sinus problems  Hematologic/Lymphatic: Easy bruising  Cardiovascular : Leg swelling  Respiratory : Cough Shortness of breath  Endocrine: Negative for endocrine symptoms  Musculoskeletal: Back pain Joint pain  Neurological: Headaches Dizziness  Psychologic: Depression Anxiety

## 2021-07-06 ENCOUNTER — Ambulatory Visit (HOSPITAL_COMMUNITY): Payer: Medicare Other | Admitting: Physical Therapy

## 2021-07-06 ENCOUNTER — Encounter (HOSPITAL_COMMUNITY): Payer: Self-pay | Admitting: Physical Therapy

## 2021-07-06 DIAGNOSIS — R262 Difficulty in walking, not elsewhere classified: Secondary | ICD-10-CM | POA: Diagnosis not present

## 2021-07-06 DIAGNOSIS — M6281 Muscle weakness (generalized): Secondary | ICD-10-CM

## 2021-07-06 DIAGNOSIS — M79605 Pain in left leg: Secondary | ICD-10-CM

## 2021-07-06 DIAGNOSIS — M25552 Pain in left hip: Secondary | ICD-10-CM

## 2021-07-06 NOTE — Therapy (Addendum)
[facility] [address] [place], [place], [address] Phone: [phone]   Fax:  [phone]  Physical Therapy Treatment  Patient Details  Name: Kyle Owen MRN: [id number] Date of Birth: [birth year] Referring Provider (PT): [name] MD  Progress Note Reporting Period 06/01/21 to 07/06/21  See note below for Objective Data and Assessment of Progress/Goals.     Encounter Date: 07/06/2021   PT End of Session - 07/06/21 1353     Visit Number 10    Number of Visits 18    Date for PT Re-Evaluation 08/03/21    Authorization Type [employer] Medicare    Progress Note Due on Visit 18    PT Start Time 1347    PT Stop Time 1430    PT Time Calculation (min) 43 min    Equipment Utilized During Treatment Gait belt    Activity Tolerance Patient limited by fatigue;Patient tolerated treatment well    Behavior During Therapy WFL for tasks assessed/performed             Past Medical History:  Diagnosis Date   Arthritis    DJD right knee   Benign prostatic hypertrophy with urinary frequency    Bladder cancer ([place])    Complication of anesthesia    gets combative in recovery   COPD with emphysema ([place])    Depression    Dyspnea    when walking   Dysrhythmia    a fib   History of colon polyps    History of kidney stones    History of loop recorder    loop recorder in place battery is dead has not had changed due to covid   History of pneumonia    hx Recurrent -- last bout Dec 2016 CAP-- per pt resolved   History of TIA (transient ischemic attack) no residual per pt   per neurologist note (dr Leta Baptist 11-08-2015) dx cryptogenic TIA versus arrhythia versus dysautonomia (right ophthalmic artery TIA and x3 posterior circulation TIAs   Hyperlipidemia    Hypertension    Multinodular thyroid    per pathology report 11-12-2014 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    Nephrolithiasis    right  non-obstructive  per CT 05-02-2016   Ocular migraine    controlled w/ verapamil   PAF (paroxysmal atrial fibrillation) ([place])    followed by AFIB clinic-- cardiologist-- dr Marlou Porch   Pneumonia    history of pnu.   Pulmonary nodule, left    left lower lobe x2 per CT 01-20-2016   Renal cyst, left    Stroke [facility])    TIA's   Thoracic aortic aneurysm without rupture ([place])    ascending -- ct chest 01/10/16  4.8cm   Wears hearing aid    bilateral-- wear at times    Past Surgical History:  Procedure Laterality Date   ANTERIOR CERVICAL DECOMP/DISCECTOMY FUSION N/A 04/03/2017   Procedure: Cervical three-four, Cervical four-five Anterior cervical decompression/discectomy/fusion;  Surgeon: Erline Levine, MD;  Location: [place];  Service: Neurosurgery;  Laterality: N/A;   CATARACT EXTRACTION W/ INTRAOCULAR LENS IMPLANT Right 2016   CATARACT EXTRACTION W/PHACO  12/16/2012   Procedure: CATARACT EXTRACTION PHACO AND INTRAOCULAR LENS PLACEMENT ([place]);  Surgeon: Tonny Branch, MD;  [facility];  Service: Ophthalmology;  Laterality: Left;  CDE:17.31   COLONOSCOPY  10/03/2011   Procedure: COLONOSCOPY;  Surgeon: Jamesetta So;  [facility];  Service: Gastroenterology;  Laterality: N/A;   CYSTOSCOPY/RETROGRADE/URETEROSCOPY/STONE EXTRACTION WITH  BASKET Right 03/29/2020   Procedure: CYSTOSCOPY/RETROGRADE/URETEROSCOPY/STONE EXTRACTION WITH BASKET;  Surgeon: Franchot Gallo, MD;  [facility];  Service: Urology;  Laterality: Right;  1 HR   DECOMPRESSIOIN ULNAR NERVE AND CUBITAL TUNNEL RELEASE Left 07-14-2009   elbow   EP IMPLANTABLE DEVICE N/A 08/10/2015   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   HOLMIUM LASER APPLICATION Right 0/01/5851   Procedure: HOLMIUM LASER APPLICATION;  Surgeon: Franchot Gallo, MD;  [facility];  Service: Urology;  Laterality: Right;   INGUINAL HERNIA REPAIR Right 1990's   KNEE ARTHROSCOPY Right 2015   LEFT CUBITAL TUNNEL RELEASE     POSTERIOR  LUMBAR FUSION  2014   X2   SHOULDER ARTHROSCOPY Left 1990's   TEE WITHOUT CARDIOVERSION N/A 07/27/2015   Procedure: TRANSESOPHAGEAL ECHOCARDIOGRAM (TEE);  Surgeon: Lelon Perla, MD;  Location[facility] ENDOSCOPY;  Service: Cardiovascular;  Laterality: N/A;   normal LV function, ef 55-60%,  mild AR and MR, mild dilated ascending aorta (4.2cm),  mild to moderate atherosclerosis  descending aorta,  mild to moderate TR,  negative saline microcavitation study   THYROIDECTOMY Right 01/20/2015   Procedure: RIGHT THYROIDECTOMY;  Surgeon: Ascencion Dike, MD;  Location: [facility] OR;  Service: ENT;  Laterality: Right;   TONSILLECTOMY  as child   TOTAL HIP ARTHROPLASTY Left 12/14/2020   Procedure: TOTAL HIP ARTHROPLASTY ANTERIOR APPROACH;  Surgeon: [name], MD;  [facility];  Service: Orthopedics;  Laterality: Left;   TRANSURETHRAL RESECTION OF BLADDER TUMOR N/A 05/15/2016   Procedure: TRANSURETHRAL RESECTION OF BLADDER TUMOR (TURBT) AND INSTILLATION OF EPIRUBICIN;  Surgeon: Franchot Gallo, MD;  Location: [facility];  Service: Urology;  Laterality: N/A;   TRANSURETHRAL RESECTION OF BLADDER TUMOR N/A 03/29/2020   Procedure: TRANSURETHRAL RESECTION OF BLADDER TUMOR (TURBT);  Surgeon: Franchot Gallo, MD;  [facility];  Service: Urology;  Laterality: N/A;    There were no vitals filed for this visit.   Subjective Assessment - 07/06/21 1352     Subjective Feeling a little less steady and more pain today, had a procedure yesterday. Feels therapy has plateaued.    Pertinent History LT THA, lumbar fusion, cervical fusion    Patient Stated Goals Be able to walk without pain/ cane    Currently in Pain? Yes    Pain Score 5     Pain Location Back    Pain Orientation Posterior;Lower    Pain Descriptors / Indicators Aching;Burning    Pain Type Chronic pain    Pain Onset More than a month ago    Pain Frequency Intermittent                [facility] PT Assessment - 07/06/21 0001        Assessment   Medical Diagnosis Lumbago s/p LT THA    Referring Provider (PT) [name] MD    Onset Date/Surgical Date 12/14/20    Prior Therapy Yes      Precautions   Precautions Fall      Restrictions   Weight Bearing Restrictions No      Prior Function   Level of Independence Needs assistance with ADLs      Cognition   Overall Cognitive Status Within Functional Limits for tasks assessed      Observation/Other Assessments   Observations trunk flexed, uses SPC    Focus on Therapeutic Outcomes (FOTO)  47% function   was 28% function     Transfers   Five time sit  to stand comments  14.46 sec with no UE      Ambulation/Gait   Ambulation/Gait Yes    Ambulation/Gait Assistance 6: Modified independent (Device/Increase time)    Ambulation Distance (Feet) 260 Feet    Assistive device Straight cane    Gait Pattern Decreased step length - right;Decreased step length - left;Decreased stride length    Gait Comments 2MWT                           [facility] Adult PT Treatment/Exercise - 07/06/21 0001       Knee/Hip Exercises: Standing   Heel Raises 20 reps    Heel Raises Limitations toe raise x 20    Hip Abduction Both;2 sets;10 reps    Hip Extension Both;2 sets;10 reps      Knee/Hip Exercises: Seated   Sit to Sand 2 sets;5 reps;without UE support                      PT Short Term Goals - 07/06/21 1403       PT SHORT TERM GOAL #1   Title Patient will be independent with initial HEP and self-management strategies to improve functional outcomes    Baseline "Doing most of the time"    Time 3    Period Weeks    Status Achieved    Target Date 06/22/21               PT Long Term Goals - 07/06/21 1402       PT LONG TERM GOAL #1   Title Patient will improve FOTO score to predicted value to indicate improvement in functional outcomes    Baseline Current 47%    Time 6    Period Weeks    Status Achieved      PT LONG TERM GOAL #2    Title Patient will report at least 60% overall improvement in subjective complaint to indicate improvement in ability to perform ADLs.    Baseline Reports 50%    Time 6    Period Weeks    Status On-going      PT LONG TERM GOAL #3   Title Patient will be able to ambulate at least 300 feet during 2MWT with LRAD to demonstrate improved ability to perform functional mobility and associated tasks.    Baseline Current 260 with straight cane    Time 6    Period Weeks    Status On-going      PT LONG TERM GOAL #4   Title Patient will be able to perform stand x 5 in < 15 seconds to demonstrate improvement in functional mobility and reduced risk for falls.    Baseline Current 5 in 14.46 sec no UE    Time 6    Period Weeks    Status Achieved                   Plan - 07/06/21 1430     Clinical Impression Statement Patient making good progress toward therapy goals. Shows improving functional mobility and ambulation. Remains limited by back pain, posturing and decreased balance/ stability. Patient will continue to benefit from skilled therapy services to address remaining deficits to reduce pain, improve functional mobility and reduce risk for future falls.    Personal Factors and Comorbidities Comorbidity 3+    Examination-Activity Limitations Bed Mobility;Sit;Stairs;Stand;Transfers;Locomotion Level    Examination-Participation Restrictions Yard Work;Community Activity    Stability/Clinical Decision  Making Stable/Uncomplicated    Rehab Potential Fair    PT Frequency 2x / week    PT Duration 6 weeks    PT Treatment/Interventions ADLs/Self Care Home Management;Aquatic Therapy;Biofeedback;Cryotherapy;Electrical Stimulation;Therapeutic exercise;Orthotic Fit/Training;Compression bandaging;Scar mobilization;Balance training;Contrast Bath;DME Instruction;Passive range of motion;Visual/perceptual remediation/compensation;Vasopneumatic Device;Taping;Splinting;Manual techniques;Patient/family  education;Functional mobility training;Therapeutic activities;Ultrasound;Parrafin;Fluidtherapy;Moist Heat;Iontophoresis 4mg /ml Dexamethasone;Stair training;Gait training;Neuromuscular re-education;Dry needling;Energy conservation;Spinal Manipulations;Joint Manipulations    PT Next Visit Plan Progress functional strengtheing.  Progress gait, balance and core/hip strengthening.  Manual as needed.    PT Home Exercise Plan 06/07/21: ab set, march supine. SLR, bridge, clam; 06/10/21: STS, sidelying ABD, heel/toe raises.6/21 standing hip abd, mini squats; 7/7: decompression exercises posture    Consulted and Agree with Plan of Care Family member/caregiver    Family Member Consulted Wife             Patient will benefit from skilled therapeutic intervention in order to improve the following deficits and impairments:  Increased fascial restricitons, Decreased range of motion, Abnormal gait, Decreased mobility, Decreased strength, Decreased balance, Decreased activity tolerance, Impaired perceived functional ability, Pain, Difficulty walking, Improper body mechanics, Impaired flexibility  Visit Diagnosis: Difficulty in walking, not elsewhere classified - Plan: PT plan of care cert/re-cert  Pain in left hip - Plan: PT plan of care cert/re-cert  Muscle weakness (generalized) - Plan: PT plan of care cert/re-cert  Pain in left leg - Plan: PT plan of care cert/re-cert     Problem List Patient Active Problem List   Diagnosis Date Noted   Morning headache 04/04/2021   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   COPD with chronic bronchitis and emphysema ([place]) 09/22/2020   Penile pain 04/07/2020   Balanitis 04/07/2020   Cervical myelopathy ([place]) 04/03/2017   Thoracic ascending aortic aneurysm ([place]) 05/09/2016   Thoracic aortic aneurysm without rupture ([place]) 01/17/2016   Paroxysmal atrial fibrillation ([place]) 12/17/2015   S/P partial  thyroidectomy 01/20/2015   5:39 PM, 07/06/21 Josue Hector PT DPT  Physical Therapist with [facility]  ([address] [place], [place], [address] Phone: [phone]   Fax:  [phone]  Name: Kyle Owen MRN: [id number] Date of Birth: 1941-09-08

## 2021-07-08 ENCOUNTER — Other Ambulatory Visit: Payer: Self-pay

## 2021-07-08 ENCOUNTER — Encounter (HOSPITAL_COMMUNITY): Payer: Self-pay

## 2021-07-08 ENCOUNTER — Ambulatory Visit (HOSPITAL_COMMUNITY): Payer: Medicare Other

## 2021-07-08 DIAGNOSIS — M79605 Pain in left leg: Secondary | ICD-10-CM

## 2021-07-08 DIAGNOSIS — R262 Difficulty in walking, not elsewhere classified: Secondary | ICD-10-CM | POA: Diagnosis not present

## 2021-07-08 DIAGNOSIS — M25552 Pain in left hip: Secondary | ICD-10-CM

## 2021-07-08 DIAGNOSIS — M6281 Muscle weakness (generalized): Secondary | ICD-10-CM

## 2021-07-08 NOTE — Therapy (Signed)
[facility] [address] [place], [place], [address] Phone: [phone]   Fax:  [phone]  Physical Therapy Treatment  Patient Details  Name: Kyle Owen MRN: [id number] Date of Birth: 11/03/1941 Referring Provider (PT): [name] MD   Encounter Date: 07/08/2021   PT End of Session - 07/08/21 1458     Visit Number 11    Number of Visits 18    Date for PT Re-Evaluation 08/03/21    Authorization Type UHC Medicare    Progress Note Due on Visit 18    PT Start Time 1448    PT Stop Time 1528    PT Time Calculation (min) 40 min    Equipment Utilized During Treatment Gait belt    Activity Tolerance Patient limited by fatigue;Patient tolerated treatment well    Behavior During Therapy [facility] for tasks assessed/performed             Past Medical History:  Diagnosis Date   Arthritis    DJD right knee   Benign prostatic hypertrophy with urinary frequency    Bladder cancer ([place])    Complication of anesthesia    gets combative in recovery   COPD with emphysema ([place])    Depression    Dyspnea    when walking   Dysrhythmia    a fib   History of colon polyps    History of kidney stones    History of loop recorder    loop recorder in place battery is dead has not had changed due to covid   History of pneumonia    hx Recurrent -- last bout Dec 2016 CAP-- per pt resolved   History of TIA (transient ischemic attack) no residual per pt   per neurologist note (dr Leta Baptist 11-08-2015) dx cryptogenic TIA versus arrhythia versus dysautonomia (right ophthalmic artery TIA and x3 posterior circulation TIAs   Hyperlipidemia    Hypertension    Multinodular thyroid    per pathology report 11-12-2014 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   Pulmonary nodule, left    left lower lobe x2 per CT 01-20-2016   Renal cyst, left    Stroke [facility])    TIA's   Thoracic aortic aneurysm without rupture ([place])    ascending -- ct chest 01/10/16  4.8cm   Wears hearing aid    bilateral-- wear at times    Past Surgical History:  Procedure Laterality Date   ANTERIOR CERVICAL DECOMP/DISCECTOMY FUSION N/A 04/03/2017   Procedure: Cervical three-four, Cervical four-five Anterior cervical decompression/discectomy/fusion;  Surgeon: Erline Levine, MD;  Location: [place] [facility];  Service: Neurosurgery;  Laterality: N/A;   CATARACT EXTRACTION W/ INTRAOCULAR LENS IMPLANT Right 2016   CATARACT EXTRACTION W/PHACO  12/16/2012   Procedure: CATARACT EXTRACTION PHACO AND INTRAOCULAR LENS PLACEMENT ([place]);  Surgeon: Tonny Branch, MD;  [facility];  Service: Ophthalmology;  Laterality: Left;  CDE:17.31   COLONOSCOPY  10/03/2011   Procedure: COLONOSCOPY;  Surgeon: Jamesetta So;  [facility];  Service: Gastroenterology;  Laterality: N/A;   CYSTOSCOPY/RETROGRADE/URETEROSCOPY/STONE EXTRACTION WITH BASKET Right 03/29/2020   Procedure: CYSTOSCOPY/RETROGRADE/URETEROSCOPY/STONE EXTRACTION WITH BASKET;  Surgeon: Franchot Gallo, MD;  [facility];  Service:  Urology;  Laterality: Right;  1 HR   DECOMPRESSIOIN ULNAR NERVE AND CUBITAL TUNNEL RELEASE Left 07-14-2009   elbow   EP IMPLANTABLE DEVICE N/A 08/10/2015   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   HOLMIUM LASER APPLICATION Right 12/30/1094   Procedure: HOLMIUM LASER APPLICATION;  Surgeon: Franchot Gallo, MD;  [facility];  Service: Urology;  Laterality: Right;   INGUINAL HERNIA REPAIR Right 1990's   KNEE ARTHROSCOPY Right 2015   LEFT CUBITAL TUNNEL RELEASE     POSTERIOR LUMBAR FUSION  2014   X2   SHOULDER ARTHROSCOPY Left 1990's   TEE WITHOUT CARDIOVERSION N/A 07/27/2015   Procedure: TRANSESOPHAGEAL  ECHOCARDIOGRAM (TEE);  Surgeon: Lelon Perla, MD;  Location: [facility] ENDOSCOPY;  Service: Cardiovascular;  Laterality: N/A;   normal LV function, ef 55-60%,  mild AR and MR, mild dilated ascending aorta (4.2cm),  mild to moderate atherosclerosis  descending aorta,  mild to moderate TR,  negative saline microcavitation study   THYROIDECTOMY Right 01/20/2015   Procedure: RIGHT THYROIDECTOMY;  Surgeon: Ascencion Dike, MD;  Location: [facility] OR;  Service: ENT;  Laterality: Right;   TONSILLECTOMY  as child   TOTAL HIP ARTHROPLASTY Left 12/14/2020   Procedure: TOTAL HIP ARTHROPLASTY ANTERIOR APPROACH;  Surgeon: [name], MD;  [facility];  Service: Orthopedics;  Laterality: Left;   TRANSURETHRAL RESECTION OF BLADDER TUMOR N/A 05/15/2016   Procedure: TRANSURETHRAL RESECTION OF BLADDER TUMOR (TURBT) AND INSTILLATION OF EPIRUBICIN;  Surgeon: Franchot Gallo, MD;  Location: [facility];  Service: Urology;  Laterality: N/A;   TRANSURETHRAL RESECTION OF BLADDER TUMOR N/A 03/29/2020   Procedure: TRANSURETHRAL RESECTION OF BLADDER TUMOR (TURBT);  Surgeon: Franchot Gallo, MD;  [facility];  Service: Urology;  Laterality: N/A;    There were no vitals filed for this visit.   Subjective Assessment - 07/08/21 1456     Subjective Pt thinks he pulled muscle on Rt lower back while trying to wipe on toilet.    Pertinent History LT THA, lumbar fusion, cervical fusion    Patient Stated Goals Be able to walk without pain/ cane    Currently in Pain? Yes    Pain Score 5     Pain Location Back    Pain Orientation Right;Lower    Pain Descriptors / Indicators Aching;Tightness    Pain Type Chronic pain    Pain Onset More than a month ago    Pain Frequency Intermittent    Aggravating Factors  walking    Pain Relieving Factors laying down                               [facility] Adult PT Treatment/Exercise - 07/08/21 0001       Exercises   Exercises Lumbar      Lumbar  Exercises: Stretches   Lower Trunk Rotation 10 seconds    Lower Trunk Rotation Limitations 10x 10" pain free range    Standing Side Bend Right;Left;5 reps    Standing Extension 5 reps      Lumbar Exercises: Standing   Row 10 reps;Theraband    Theraband Level (Row) Level 4 (Blue)    Row Limitations 2 sets, 5" holds    Shoulder Extension 10 reps;Theraband    Theraband Level (Shoulder Extension) Level 4 (Blue)    Shoulder Extension Limitations 2 sets, 5" holds    Other Standing Lumbar Exercises wall arch 15x 3"  Other Standing Lumbar Exercises marchingx 10; sidestep 2RT      Lumbar Exercises: Supine   Bridge 10 reps                      PT Short Term Goals - 07/06/21 1403       PT SHORT TERM GOAL #1   Title Patient will be independent with initial HEP and self-management strategies to improve functional outcomes    Baseline "Doing most of the time"    Time 3    Period Weeks    Status Achieved    Target Date 06/22/21               PT Long Term Goals - 07/06/21 1402       PT LONG TERM GOAL #1   Title Patient will improve FOTO score to predicted value to indicate improvement in functional outcomes    Baseline Current 47%    Time 6    Period Weeks    Status Achieved      PT LONG TERM GOAL #2   Title Patient will report at least 60% overall improvement in subjective complaint to indicate improvement in ability to perform ADLs.    Baseline Reports 50%    Time 6    Period Weeks    Status On-going      PT LONG TERM GOAL #3   Title Patient will be able to ambulate at least 300 feet during 2MWT with LRAD to demonstrate improved ability to perform functional mobility and associated tasks.    Baseline Current 260 with straight cane    Time 6    Period Weeks    Status On-going      PT LONG TERM GOAL #4   Title Patient will be able to perform stand x 5 in < 15 seconds to demonstrate improvement in functional mobility and reduced risk for falls.     Baseline Current 5 in 14.46 sec no UE    Time 6    Period Weeks    Status Achieved                   Plan - 07/08/21 1509     Clinical Impression Statement Added stretches to address tightness/pain lower back with reports of reduction following.  Added theraband for postural strengthening.    Pt reports pain reduced at EOS.    Personal Factors and Comorbidities Comorbidity 3+    Examination-Activity Limitations Bed Mobility;Sit;Stairs;Stand;Transfers;Locomotion Level    Examination-Participation Restrictions Yard Work;Community Activity    Stability/Clinical Decision Making Stable/Uncomplicated    Clinical Decision Making Low    Rehab Potential Fair    PT Frequency 2x / week    PT Duration 6 weeks    PT Treatment/Interventions ADLs/Self Care Home Management;Aquatic Therapy;Biofeedback;Cryotherapy;Electrical Stimulation;Therapeutic exercise;Orthotic Fit/Training;Compression bandaging;Scar mobilization;Balance training;Contrast Bath;DME Instruction;Passive range of motion;Visual/perceptual remediation/compensation;Vasopneumatic Device;Taping;Splinting;Manual techniques;Patient/family education;Functional mobility training;Therapeutic activities;Ultrasound;Parrafin;Fluidtherapy;Moist Heat;Iontophoresis 4mg /ml Dexamethasone;Stair training;Gait training;Neuromuscular re-education;Dry needling;Energy conservation;Spinal Manipulations;Joint Manipulations    PT Next Visit Plan Progress functional strengtheing.  Progress gait, balance and core/hip strengthening.  Manual as needed.    PT Home Exercise Plan 06/07/21: ab set, march supine. SLR, bridge, clam; 06/10/21: STS, sidelying ABD, heel/toe raises.6/21 standing hip abd, mini squats; 7/7: decompression exercises posture    Consulted and Agree with Plan of Care Family member/caregiver;Patient    Family Member Consulted Wife             Patient will benefit from skilled therapeutic intervention in  order to improve the following deficits  and impairments:  Increased fascial restricitons, Decreased range of motion, Abnormal gait, Decreased mobility, Decreased strength, Decreased balance, Decreased activity tolerance, Impaired perceived functional ability, Pain, Difficulty walking, Improper body mechanics, Impaired flexibility  Visit Diagnosis: Difficulty in walking, not elsewhere classified  Pain in left hip  Muscle weakness (generalized)  Pain in left leg     Problem List Patient Active Problem List   Diagnosis Date Noted   Morning headache 04/04/2021   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   COPD with chronic bronchitis and emphysema ([place]) 09/22/2020   Penile pain 04/07/2020   Balanitis 04/07/2020   Cervical myelopathy ([place]) 04/03/2017   Thoracic ascending aortic aneurysm ([place]) 05/09/2016   Thoracic aortic aneurysm without rupture ([place]) 01/17/2016   Paroxysmal atrial fibrillation ([place]) 12/17/2015   S/P partial thyroidectomy 01/20/2015   Ihor Austin, LPTA/CLT; CBIS [phone]  Aldona Lento 07/08/2021, 5:22 PM  [facility] [address] [place], [place], [address] Phone: [phone]   Fax:  [phone]  Name: Kyle Owen MRN: [id number] Date of Birth: May 27, 1941

## 2021-07-12 ENCOUNTER — Other Ambulatory Visit: Payer: Self-pay

## 2021-07-12 ENCOUNTER — Ambulatory Visit (HOSPITAL_COMMUNITY): Payer: Medicare Other | Admitting: Physical Therapy

## 2021-07-12 ENCOUNTER — Encounter (HOSPITAL_COMMUNITY): Payer: Self-pay | Admitting: Physical Therapy

## 2021-07-12 DIAGNOSIS — R262 Difficulty in walking, not elsewhere classified: Secondary | ICD-10-CM | POA: Diagnosis not present

## 2021-07-12 DIAGNOSIS — M79605 Pain in left leg: Secondary | ICD-10-CM

## 2021-07-12 DIAGNOSIS — M25552 Pain in left hip: Secondary | ICD-10-CM

## 2021-07-12 DIAGNOSIS — M6281 Muscle weakness (generalized): Secondary | ICD-10-CM

## 2021-07-12 NOTE — Therapy (Signed)
[facility] [address] [place], [place], [address] Phone: [phone]   Fax:  [phone]  Physical Therapy Treatment  Patient Details  Name: Kyle Owen MRN: [id number] Date of Birth: [birth year] Referring Provider (PT): [name] MD   Encounter Date: 07/12/2021   PT End of Session - 07/12/21 1315     Visit Number 12    Number of Visits 18    Date for PT Re-Evaluation 08/03/21    Authorization Type UHC Medicare    Progress Note Due on Visit 18    PT Start Time 1316    PT Stop Time 1355    PT Time Calculation (min) 39 min    Equipment Utilized During Treatment Gait belt    Activity Tolerance Patient limited by fatigue;Patient tolerated treatment well    Behavior During Therapy [facility] for tasks assessed/performed             Past Medical History:  Diagnosis Date   Arthritis    DJD right knee   Benign prostatic hypertrophy with urinary frequency    Bladder cancer ([place])    Complication of anesthesia    gets combative in recovery   COPD with emphysema ([place])    Depression    Dyspnea    when walking   Dysrhythmia    a fib   History of colon polyps    History of kidney stones    History of loop recorder    loop recorder in place battery is dead has not had changed due to covid   History of pneumonia    hx Recurrent -- last bout Dec 2016 CAP-- per pt resolved   History of TIA (transient ischemic attack) no residual per pt   per neurologist note (dr Leta Baptist 11-08-2015) dx cryptogenic TIA versus arrhythia versus dysautonomia (right ophthalmic artery TIA and x3 posterior circulation TIAs   Hyperlipidemia    Hypertension    Multinodular thyroid    per pathology report 11-12-2014 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   Pulmonary nodule, left    left lower lobe x2 per CT 01-20-2016   Renal cyst, left    Stroke [facility])    TIA's   Thoracic aortic aneurysm without rupture ([place])    ascending -- ct chest 01/10/16  4.8cm   Wears hearing aid    bilateral-- wear at times    Past Surgical History:  Procedure Laterality Date   ANTERIOR CERVICAL DECOMP/DISCECTOMY FUSION N/A 04/03/2017   Procedure: Cervical three-four, Cervical four-five Anterior cervical decompression/discectomy/fusion;  Surgeon: Erline Levine, MD;  Location: [facility];  Service: Neurosurgery;  Laterality: N/A;   CATARACT EXTRACTION W/ INTRAOCULAR LENS IMPLANT Right 2016   CATARACT EXTRACTION W/PHACO  12/16/2012   Procedure: CATARACT EXTRACTION PHACO AND INTRAOCULAR LENS PLACEMENT ([place]);  Surgeon: Tonny Branch, MD;  [facility];  Service: Ophthalmology;  Laterality: Left;  CDE:17.31   COLONOSCOPY  10/03/2011   Procedure: COLONOSCOPY;  Surgeon: Jamesetta So;  [facility];  Service: Gastroenterology;  Laterality: N/A;   CYSTOSCOPY/RETROGRADE/URETEROSCOPY/STONE EXTRACTION WITH BASKET Right 03/29/2020   Procedure: CYSTOSCOPY/RETROGRADE/URETEROSCOPY/STONE EXTRACTION WITH BASKET;  Surgeon: Franchot Gallo, MD;  [facility];  Service:  Urology;  Laterality: Right;  1 HR   DECOMPRESSIOIN ULNAR NERVE AND CUBITAL TUNNEL RELEASE Left 07-14-2009   elbow   EP IMPLANTABLE DEVICE N/A 08/10/2015   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   HOLMIUM LASER APPLICATION Right 02/02/9370   Procedure: HOLMIUM LASER APPLICATION;  Surgeon: Franchot Gallo, MD;  [facility];  Service: Urology;  Laterality: Right;   INGUINAL HERNIA REPAIR Right 1990's   KNEE ARTHROSCOPY Right 2015   LEFT CUBITAL TUNNEL RELEASE     POSTERIOR LUMBAR FUSION  2014   X2   SHOULDER ARTHROSCOPY Left 1990's   TEE WITHOUT CARDIOVERSION N/A 07/27/2015   Procedure: TRANSESOPHAGEAL  ECHOCARDIOGRAM (TEE);  Surgeon: Lelon Perla, MD;  Location: [facility] ENDOSCOPY;  Service: Cardiovascular;  Laterality: N/A;   normal LV function, ef 55-60%,  mild AR and MR, mild dilated ascending aorta (4.2cm),  mild to moderate atherosclerosis  descending aorta,  mild to moderate TR,  negative saline microcavitation study   THYROIDECTOMY Right 01/20/2015   Procedure: RIGHT THYROIDECTOMY;  Surgeon: Ascencion Dike, MD;  Location: [facility] OR;  Service: ENT;  Laterality: Right;   TONSILLECTOMY  as child   TOTAL HIP ARTHROPLASTY Left 12/14/2020   Procedure: TOTAL HIP ARTHROPLASTY ANTERIOR APPROACH;  Surgeon: [name], MD;  [facility];  Service: Orthopedics;  Laterality: Left;   TRANSURETHRAL RESECTION OF BLADDER TUMOR N/A 05/15/2016   Procedure: TRANSURETHRAL RESECTION OF BLADDER TUMOR (TURBT) AND INSTILLATION OF EPIRUBICIN;  Surgeon: Franchot Gallo, MD;  Location: [facility];  Service: Urology;  Laterality: N/A;   TRANSURETHRAL RESECTION OF BLADDER TUMOR N/A 03/29/2020   Procedure: TRANSURETHRAL RESECTION OF BLADDER TUMOR (TURBT);  Surgeon: Franchot Gallo, MD;  [facility];  Service: Urology;  Laterality: N/A;    There were no vitals filed for this visit.   Subjective Assessment - 07/12/21 1317     Subjective Patient states he is walking better. hip and back are bothering him today as he was digging holes.    Pertinent History LT THA, lumbar fusion, cervical fusion    Patient Stated Goals Be able to walk without pain/ cane    Currently in Pain? Yes    Pain Score 5     Pain Location Hip    Pain Orientation Left    Pain Descriptors / Indicators Aching    Pain Type Chronic pain    Pain Onset More than a month ago    Pain Frequency Intermittent                               [facility] Adult PT Treatment/Exercise - 07/12/21 0001       Lumbar Exercises: Standing   Other Standing Lumbar Exercises marching/ step taps 8 inch 2# 3x 1 minutes; retro  weighted gait 10 x 3 plates; palof 2x 10 bilateral blue      Knee/Hip Exercises: Standing   Hip Extension Both;2 sets;10 reps    Extension Limitations 2#    Other Standing Knee Exercises lateral stepping 2# 5 RT                    PT Education - 07/12/21 1317     Education Details HEP    Person(s) Educated Patient    Methods Explanation    Comprehension Verbalized understanding              PT Short Term Goals - 07/06/21  [place] #1   Title Patient will be independent with initial HEP and self-management strategies to improve functional outcomes    Baseline "Doing most of the time"    Time 3    Period Weeks    Status Achieved    Target Date 06/22/21               PT Long Term Goals - 07/06/21 1402       PT LONG TERM GOAL #1   Title Patient will improve FOTO score to predicted value to indicate improvement in functional outcomes    Baseline Current 47%    Time 6    Period Weeks    Status Achieved      PT LONG TERM GOAL #2   Title Patient will report at least 60% overall improvement in subjective complaint to indicate improvement in ability to perform ADLs.    Baseline Reports 50%    Time 6    Period Weeks    Status On-going      PT LONG TERM GOAL #3   Title Patient will be able to ambulate at least 300 feet during 2MWT with LRAD to demonstrate improved ability to perform functional mobility and associated tasks.    Baseline Current 260 with straight cane    Time 6    Period Weeks    Status On-going      PT LONG TERM GOAL #4   Title Patient will be able to perform stand x 5 in < 15 seconds to demonstrate improvement in functional mobility and reduced risk for falls.    Baseline Current 5 in 14.46 sec no UE    Time 6    Period Weeks    Status Achieved                   Plan - 07/12/21 1317     Clinical Impression Statement Patient given frequent cueing for posture throughout session due to tendency for  trunk flexion. Patient with shortened hip flexors and is educated on prone lying to assess for improving mobility. Patient given CGA and cueing for proper weight acceptance with retro stepping. Patient fatigued at end of session. Patient will continue to benefit from skilled physical therapy in order reduce impairment and improve function.    Personal Factors and Comorbidities Comorbidity 3+    Examination-Activity Limitations Bed Mobility;Sit;Stairs;Stand;Transfers;Locomotion Level    Examination-Participation Restrictions Yard Work;Community Activity    Stability/Clinical Decision Making Stable/Uncomplicated    Rehab Potential Fair    PT Frequency 2x / week    PT Duration 6 weeks    PT Treatment/Interventions ADLs/Self Care Home Management;Aquatic Therapy;Biofeedback;Cryotherapy;Electrical Stimulation;Therapeutic exercise;Orthotic Fit/Training;Compression bandaging;Scar mobilization;Balance training;Contrast Bath;DME Instruction;Passive range of motion;Visual/perceptual remediation/compensation;Vasopneumatic Device;Taping;Splinting;Manual techniques;Patient/family education;Functional mobility training;Therapeutic activities;Ultrasound;Parrafin;Fluidtherapy;Moist Heat;Iontophoresis 4mg /ml Dexamethasone;Stair training;Gait training;Neuromuscular re-education;Dry needling;Energy conservation;Spinal Manipulations;Joint Manipulations    PT Next Visit Plan Progress functional strengtheing.  Progress gait, balance and core/hip strengthening.  Manual as needed.    PT Home Exercise Plan 06/07/21: ab set, march supine. SLR, bridge, clam; 06/10/21: STS, sidelying ABD, heel/toe raises.6/21 standing hip abd, mini squats; 7/7: decompression exercises posture    Consulted and Agree with Plan of Care Family member/caregiver;Patient    Family Member Consulted Wife             Patient will benefit from skilled therapeutic intervention in order to improve the following deficits and impairments:  Increased fascial  restricitons, Decreased range of motion, Abnormal  gait, Decreased mobility, Decreased strength, Decreased balance, Decreased activity tolerance, Impaired perceived functional ability, Pain, Difficulty walking, Improper body mechanics, Impaired flexibility  Visit Diagnosis: Difficulty in walking, not elsewhere classified  Pain in left hip  Muscle weakness (generalized)  Pain in left leg     Problem List Patient Active Problem List   Diagnosis Date Noted   Morning headache 04/04/2021   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

## 2021-07-14 ENCOUNTER — Encounter (HOSPITAL_COMMUNITY): Payer: Self-pay | Admitting: Physical Therapy

## 2021-07-14 ENCOUNTER — Ambulatory Visit (HOSPITAL_COMMUNITY): Payer: Medicare Other | Admitting: Physical Therapy

## 2021-07-14 ENCOUNTER — Other Ambulatory Visit: Payer: Self-pay

## 2021-07-14 DIAGNOSIS — R262 Difficulty in walking, not elsewhere classified: Secondary | ICD-10-CM

## 2021-07-14 DIAGNOSIS — M6281 Muscle weakness (generalized): Secondary | ICD-10-CM

## 2021-07-14 DIAGNOSIS — M25552 Pain in left hip: Secondary | ICD-10-CM

## 2021-07-14 DIAGNOSIS — M79605 Pain in left leg: Secondary | ICD-10-CM

## 2021-07-14 NOTE — Therapy (Signed)
[facility] [address] [place], [place], [address] Phone: [phone]   Fax:  [phone]  Physical Therapy Treatment  Patient Details  Name: Kyle Owen MRN: [id number] Date of Birth: November 23, 1941 Referring Provider (PT): [name] MD   Encounter Date: 07/14/2021   PT End of Session - 07/14/21 1312     Visit Number 13    Number of Visits 18    Date for PT Re-Evaluation 08/03/21    Authorization Type [employer] Medicare    Progress Note Due on Visit 18    PT Start Time 1305    PT Stop Time 1343    PT Time Calculation (min) 38 min    Equipment Utilized During Treatment Gait belt    Activity Tolerance Patient tolerated treatment well    Behavior During Therapy WFL for tasks assessed/performed             Past Medical History:  Diagnosis Date   Arthritis    DJD right knee   Benign prostatic hypertrophy with urinary frequency    Bladder cancer ([place])    Complication of anesthesia    gets combative in recovery   COPD with emphysema ([place])    Depression    Dyspnea    when walking   Dysrhythmia    a fib   History of colon polyps    History of kidney stones    History of loop recorder    loop recorder in place battery is dead has not had changed due to covid   History of pneumonia    hx Recurrent -- last bout Dec 2016 CAP-- per pt resolved   History of TIA (transient ischemic attack) no residual per pt   per neurologist note (dr Leta Baptist 11-08-2015) dx cryptogenic TIA versus arrhythia versus dysautonomia (right ophthalmic artery TIA and x3 posterior circulation TIAs   Hyperlipidemia    Hypertension    Multinodular thyroid    per pathology report 11-12-2014 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   Pulmonary nodule, left    left lower lobe x2 per CT 01-20-2016   Renal cyst, left    Stroke [facility])    TIA's   Thoracic aortic aneurysm without rupture ([place])    ascending -- ct chest 01/10/16  4.8cm   Wears hearing aid    bilateral-- wear at times    Past Surgical History:  Procedure Laterality Date   ANTERIOR CERVICAL DECOMP/DISCECTOMY FUSION N/A 04/03/2017   Procedure: Cervical three-four, Cervical four-five Anterior cervical decompression/discectomy/fusion;  Surgeon: Erline Levine, MD;  Location: [place];  Service: Neurosurgery;  Laterality: N/A;   CATARACT EXTRACTION W/ INTRAOCULAR LENS IMPLANT Right 2016   CATARACT EXTRACTION W/PHACO  12/16/2012   Procedure: CATARACT EXTRACTION PHACO AND INTRAOCULAR LENS PLACEMENT ([place]);  Surgeon: Tonny Branch, MD;  [facility];  Service: Ophthalmology;  Laterality: Left;  CDE:17.31   COLONOSCOPY  10/03/2011   Procedure: COLONOSCOPY;  Surgeon: Jamesetta So;  [facility];  Service: Gastroenterology;  Laterality: N/A;   CYSTOSCOPY/RETROGRADE/URETEROSCOPY/STONE EXTRACTION WITH BASKET Right 03/29/2020   Procedure: CYSTOSCOPY/RETROGRADE/URETEROSCOPY/STONE EXTRACTION WITH BASKET;  Surgeon: Franchot Gallo, MD;  [facility];  Service: Urology;  Laterality:  Right;  1 HR   DECOMPRESSIOIN ULNAR NERVE AND CUBITAL TUNNEL RELEASE Left 07-14-2009   elbow   EP IMPLANTABLE DEVICE N/A 08/10/2015   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   HOLMIUM LASER APPLICATION Right 08/30/1883   Procedure: HOLMIUM LASER APPLICATION;  Surgeon: Franchot Gallo, MD;  [facility];  Service: Urology;  Laterality: Right;   INGUINAL HERNIA REPAIR Right 1990's   KNEE ARTHROSCOPY Right 2015   LEFT CUBITAL TUNNEL RELEASE     POSTERIOR LUMBAR FUSION  2014   X2   SHOULDER ARTHROSCOPY Left 1990's   TEE WITHOUT CARDIOVERSION N/A 07/27/2015   Procedure: TRANSESOPHAGEAL ECHOCARDIOGRAM (TEE);   Surgeon: Lelon Perla, MD;  Location: [facility] ENDOSCOPY;  Service: Cardiovascular;  Laterality: N/A;   normal LV function, ef 55-60%,  mild AR and MR, mild dilated ascending aorta (4.2cm),  mild to moderate atherosclerosis  descending aorta,  mild to moderate TR,  negative saline microcavitation study   THYROIDECTOMY Right 01/20/2015   Procedure: RIGHT THYROIDECTOMY;  Surgeon: Ascencion Dike, MD;  Location: [facility] OR;  Service: ENT;  Laterality: Right;   TONSILLECTOMY  as child   TOTAL HIP ARTHROPLASTY Left 12/14/2020   Procedure: TOTAL HIP ARTHROPLASTY ANTERIOR APPROACH;  Surgeon: [name], MD;  [facility];  Service: Orthopedics;  Laterality: Left;   TRANSURETHRAL RESECTION OF BLADDER TUMOR N/A 05/15/2016   Procedure: TRANSURETHRAL RESECTION OF BLADDER TUMOR (TURBT) AND INSTILLATION OF EPIRUBICIN;  Surgeon: Franchot Gallo, MD;  [facility];  Service: Urology;  Laterality: N/A;   TRANSURETHRAL RESECTION OF BLADDER TUMOR N/A 03/29/2020   Procedure: TRANSURETHRAL RESECTION OF BLADDER TUMOR (TURBT);  Surgeon: Franchot Gallo, MD;  [facility];  Service: Urology;  Laterality: N/A;    There were no vitals filed for this visit.   Subjective Assessment - 07/14/21 1312     Subjective Back is about a 3. Sidestepping was difficult last time.    Pertinent History LT THA, lumbar fusion, cervical fusion    Patient Stated Goals Be able to walk without pain/ cane    Currently in Pain? Yes    Pain Score 3     Pain Location Back    Pain Orientation Posterior;Lower    Pain Descriptors / Indicators Aching    Pain Onset More than a month ago                               [facility] Adult PT Treatment/Exercise - 07/14/21 0001       Lumbar Exercises: Aerobic   Recumbent Bike 4 min Lv 2 dynamic warmup      Lumbar Exercises: Standing   Heel Raises 20 reps    Row Theraband;20 reps    Theraband Level (Row) Level 3 (Green)    Shoulder Extension 20  reps;Theraband    Theraband Level (Shoulder Extension) Level 3 (Green)    Other Standing Lumbar Exercises palloff press in semi tandem x 20 each wiht GBT      Knee/Hip Exercises: Standing   Hip Flexion Both;2 sets;10 reps    Hip Flexion Limitations 3#    Hip Abduction Both;2 sets;10 reps    Abduction Limitations 3#    Hip Extension Both;2 sets;10 reps    Extension Limitations 3#    Other Standing Knee Exercises tandem stance 3 x 30"      Knee/Hip Exercises: Seated   Long Arc Quad Both;2 sets;10 reps;Weights  Long Arc Quad Weight 3 lbs.                      PT Short Term Goals - 07/06/21 1403       PT SHORT TERM GOAL #1   Title Patient will be independent with initial HEP and self-management strategies to improve functional outcomes    Baseline "Doing most of the time"    Time 3    Period Weeks    Status Achieved    Target Date 06/22/21               PT Long Term Goals - 07/06/21 1402       PT LONG TERM GOAL #1   Title Patient will improve FOTO score to predicted value to indicate improvement in functional outcomes    Baseline Current 47%    Time 6    Period Weeks    Status Achieved      PT LONG TERM GOAL #2   Title Patient will report at least 60% overall improvement in subjective complaint to indicate improvement in ability to perform ADLs.    Baseline Reports 50%    Time 6    Period Weeks    Status On-going      PT LONG TERM GOAL #3   Title Patient will be able to ambulate at least 300 feet during 2MWT with LRAD to demonstrate improved ability to perform functional mobility and associated tasks.    Baseline Current 260 with straight cane    Time 6    Period Weeks    Status On-going      PT LONG TERM GOAL #4   Title Patient will be able to perform stand x 5 in < 15 seconds to demonstrate improvement in functional mobility and reduced risk for falls.    Baseline Current 5 in 14.46 sec no UE    Time 6    Period Weeks    Status Achieved                    Plan - 07/14/21 1348     Clinical Impression Statement Tolerated session well overall. Intermittent LT hip pain with WB. Focused on continuation of hip and core strengthening. Progressed to 3 lb ankle weights. Patient cued on posture and TA activation throughout. Will continue to progress as tolerated.    Personal Factors and Comorbidities Comorbidity 3+    Examination-Activity Limitations Bed Mobility;Sit;Stairs;Stand;Transfers;Locomotion Level    Examination-Participation Restrictions Yard Work;Community Activity    Stability/Clinical Decision Making Stable/Uncomplicated    Rehab Potential Fair    PT Frequency 2x / week    PT Duration 6 weeks    PT Treatment/Interventions ADLs/Self Care Home Management;Aquatic Therapy;Biofeedback;Cryotherapy;Electrical Stimulation;Therapeutic exercise;Orthotic Fit/Training;Compression bandaging;Scar mobilization;Balance training;Contrast Bath;DME Instruction;Passive range of motion;Visual/perceptual remediation/compensation;Vasopneumatic Device;Taping;Splinting;Manual techniques;Patient/family education;Functional mobility training;Therapeutic activities;Ultrasound;Parrafin;Fluidtherapy;Moist Heat;Iontophoresis 4mg /ml Dexamethasone;Stair training;Gait training;Neuromuscular re-education;Dry needling;Energy conservation;Spinal Manipulations;Joint Manipulations    PT Next Visit Plan Progress functional strengtheing.  Progress gait, balance and core/hip strengthening.  Manual as needed.    PT Home Exercise Plan 06/07/21: ab set, march supine. SLR, bridge, clam; 06/10/21: STS, sidelying ABD, heel/toe raises.6/21 standing hip abd, mini squats; 7/7: decompression exercises posture    Consulted and Agree with Plan of Care Family member/caregiver;Patient    Family Member Consulted Wife             Patient will benefit from skilled therapeutic intervention in order to improve the following deficits and impairments:  Increased fascial  restricitons, Decreased range of motion, Abnormal gait, Decreased mobility, Decreased strength, Decreased balance, Decreased activity tolerance, Impaired perceived functional ability, Pain, Difficulty walking, Improper body mechanics, Impaired flexibility  Visit Diagnosis: Difficulty in walking, not elsewhere classified  Pain in left hip  Muscle weakness (generalized)  Pain in left leg     Problem List Patient Active Problem List   Diagnosis Date Noted   Morning headache 04/04/2021   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/03/2020   COPD with chronic bronchitis and emphysema ([place]) 09/22/2020   Penile pain 04/07/2020   Balanitis 04/07/2020   Cervical myelopathy ([place]) 04/03/2017   Thoracic ascending aortic aneurysm ([place]) 05/09/2016   Thoracic aortic aneurysm without rupture ([place]) 01/17/2016   Paroxysmal atrial fibrillation ([place]) 12/17/2015   S/P partial thyroidectomy 01/20/2015   1:50 PM, 07/14/21 Josue Hector PT DPT  Physical Therapist with [place]  [facility]  [phone] [place] [address] [place], [place], [address] Phone: [phone]   Fax:  [phone]  Name: Kyle Owen MRN: [id number] Date of Birth: June 16, 1941

## 2021-07-25 ENCOUNTER — Ambulatory Visit (HOSPITAL_COMMUNITY): Payer: Medicare Other | Attending: Orthopedic Surgery | Admitting: Physical Therapy

## 2021-07-25 ENCOUNTER — Encounter (HOSPITAL_COMMUNITY): Payer: Self-pay | Admitting: Physical Therapy

## 2021-07-25 ENCOUNTER — Other Ambulatory Visit: Payer: Self-pay

## 2021-07-25 DIAGNOSIS — M25552 Pain in left hip: Secondary | ICD-10-CM | POA: Insufficient documentation

## 2021-07-25 DIAGNOSIS — M79605 Pain in left leg: Secondary | ICD-10-CM | POA: Insufficient documentation

## 2021-07-25 DIAGNOSIS — M6281 Muscle weakness (generalized): Secondary | ICD-10-CM | POA: Insufficient documentation

## 2021-07-25 DIAGNOSIS — R262 Difficulty in walking, not elsewhere classified: Secondary | ICD-10-CM | POA: Diagnosis present

## 2021-07-25 NOTE — Therapy (Signed)
[facility] [address] [place], [place], [address] Phone: [phone]   Fax:  [phone]  Physical Therapy Treatment  Patient Details  Name: Kyle Owen MRN: [id number] Date of Birth: 13-Dec-1941 Referring Provider (PT): [name] MD   Encounter Date: 07/25/2021   PT End of Session - 07/25/21 1002     Visit Number 14    Number of Visits 18    Date for PT Re-Evaluation 08/03/21    Authorization Type UHC Medicare    Progress Note Due on Visit 18    PT Start Time 1002    PT Stop Time 1040    PT Time Calculation (min) 38 min    Equipment Utilized During Treatment Gait belt    Activity Tolerance Patient tolerated treatment well    Behavior During Therapy WFL for tasks assessed/performed             Past Medical History:  Diagnosis Date   Arthritis    DJD right knee   Benign prostatic hypertrophy with urinary frequency    Bladder cancer ([place])    Complication of anesthesia    gets combative in recovery   COPD with emphysema ([place])    Depression    Dyspnea    when walking   Dysrhythmia    a fib   History of colon polyps    History of kidney stones    History of loop recorder    loop recorder in place battery is dead has not had changed due to covid   History of pneumonia    hx Recurrent -- last bout Dec 2016 CAP-- per pt resolved   History of TIA (transient ischemic attack) no residual per pt   per neurologist note (dr Leta Baptist 11-08-2015) dx cryptogenic TIA versus arrhythia versus dysautonomia (right ophthalmic artery TIA and x3 posterior circulation TIAs   Hyperlipidemia    Hypertension    Multinodular thyroid    per pathology report 11-12-2014 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   Pulmonary nodule, left    left lower lobe x2 per CT 01-20-2016   Renal cyst, left    Stroke [facility])    TIA's   Thoracic aortic aneurysm without rupture ([place])    ascending -- ct chest 01/10/16  4.8cm   Wears hearing aid    bilateral-- wear at times    Past Surgical History:  Procedure Laterality Date   ANTERIOR CERVICAL DECOMP/DISCECTOMY FUSION N/A 04/03/2017   Procedure: Cervical three-four, Cervical four-five Anterior cervical decompression/discectomy/fusion;  Surgeon: Erline Levine, MD;  Location: [place];  Service: Neurosurgery;  Laterality: N/A;   CATARACT EXTRACTION W/ INTRAOCULAR LENS IMPLANT Right 2016   CATARACT EXTRACTION W/PHACO  12/16/2012   Procedure: CATARACT EXTRACTION PHACO AND INTRAOCULAR LENS PLACEMENT ([place]);  Surgeon: Tonny Branch, MD;  [facility];  Service: Ophthalmology;  Laterality: Left;  CDE:17.31   COLONOSCOPY  10/03/2011   Procedure: COLONOSCOPY;  Surgeon: Jamesetta So;  [facility];  Service: Gastroenterology;  Laterality: N/A;   CYSTOSCOPY/RETROGRADE/URETEROSCOPY/STONE EXTRACTION WITH BASKET Right 03/29/2020   Procedure: CYSTOSCOPY/RETROGRADE/URETEROSCOPY/STONE EXTRACTION WITH BASKET;  Surgeon: Franchot Gallo, MD;  [facility];  Service: Urology;  Laterality:  Right;  1 HR   DECOMPRESSIOIN ULNAR NERVE AND CUBITAL TUNNEL RELEASE Left 07-14-2009   elbow   EP IMPLANTABLE DEVICE N/A 08/10/2015   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   HOLMIUM LASER APPLICATION Right [id number]   Procedure: HOLMIUM LASER APPLICATION;  Surgeon: Franchot Gallo, MD;  [facility];  Service: Urology;  Laterality: Right;   INGUINAL HERNIA REPAIR Right 1990's   KNEE ARTHROSCOPY Right 2015   LEFT CUBITAL TUNNEL RELEASE     POSTERIOR LUMBAR FUSION  2014   X2   SHOULDER ARTHROSCOPY Left 1990's   TEE WITHOUT CARDIOVERSION N/A 07/27/2015   Procedure: TRANSESOPHAGEAL ECHOCARDIOGRAM (TEE);   Surgeon: Lelon Perla, MD;  Location: [facility] ENDOSCOPY;  Service: Cardiovascular;  Laterality: N/A;   normal LV function, ef 55-60%,  mild AR and MR, mild dilated ascending aorta (4.2cm),  mild to moderate atherosclerosis  descending aorta,  mild to moderate TR,  negative saline microcavitation study   THYROIDECTOMY Right 01/20/2015   Procedure: RIGHT THYROIDECTOMY;  Surgeon: Ascencion Dike, MD;  Location: [facility] OR;  Service: ENT;  Laterality: Right;   TONSILLECTOMY  as child   TOTAL HIP ARTHROPLASTY Left 12/14/2020   Procedure: TOTAL HIP ARTHROPLASTY ANTERIOR APPROACH;  Surgeon: [name], MD;  [facility];  Service: Orthopedics;  Laterality: Left;   TRANSURETHRAL RESECTION OF BLADDER TUMOR N/A 05/15/2016   Procedure: TRANSURETHRAL RESECTION OF BLADDER TUMOR (TURBT) AND INSTILLATION OF EPIRUBICIN;  Surgeon: Franchot Gallo, MD;  Location: [facility];  Service: Urology;  Laterality: N/A;   TRANSURETHRAL RESECTION OF BLADDER TUMOR N/A 03/29/2020   Procedure: TRANSURETHRAL RESECTION OF BLADDER TUMOR (TURBT);  Surgeon: Franchot Gallo, MD;  [facility];  Service: Urology;  Laterality: N/A;    There were no vitals filed for this visit.   Subjective Assessment - 07/25/21 1002     Subjective Patient states back and hip are about the same. No real change.    Pertinent History LT THA, lumbar fusion, cervical fusion    Patient Stated Goals Be able to walk without pain/ cane    Currently in Pain? Yes    Pain Score 4     Pain Location Back    Pain Orientation Posterior;Lower    Pain Descriptors / Indicators Aching    Pain Type Chronic pain    Pain Onset More than a month ago                               [facility] Adult PT Treatment/Exercise - 07/25/21 0001       Ambulation/Gait   Ambulation/Gait Yes    Ambulation/Gait Assistance 6: Modified independent (Device/Increase time)    Ambulation Distance (Feet) 80 Feet    Assistive device Parallel bars     Gait Comments cueing for glute activation      Lumbar Exercises: Owen   Row Theraband;20 reps    Theraband Level (Row) Level 3 (Green)    Shoulder Extension 20 reps;Theraband    Theraband Level (Shoulder Extension) Level 3 (Green)    Other Owen Lumbar Exercises --      Knee/Hip Exercises: Owen   Hip Flexion Both;2 sets;10 reps    Hip Flexion Limitations 3#    Hip Abduction Both;2 sets;10 reps    Abduction Limitations 3#    Hip Extension Both;2 sets;10 reps    Extension Limitations 3#    Other Owen Knee Exercises tandem stance  3 x 30"    Other Owen Knee Exercises lateral stepping 5 RT                    PT Education - 07/25/21 1002     Education Details HEP    Person(s) Educated Patient    Methods Explanation    Comprehension Verbalized understanding              PT Short Term Goals - 07/06/21 1403       PT SHORT TERM GOAL #1   Title Patient will be independent with initial HEP and self-management strategies to improve functional outcomes    Baseline "Doing most of the time"    Time 3    Period Weeks    Status Achieved    Target Date 06/22/21               PT Long Term Goals - 07/06/21 1402       PT LONG TERM GOAL #1   Title Patient will improve FOTO score to predicted value to indicate improvement in functional outcomes    Baseline Current 47%    Time 6    Period Weeks    Status Achieved      PT LONG TERM GOAL #2   Title Patient will report at least 60% overall improvement in subjective complaint to indicate improvement in ability to perform ADLs.    Baseline Reports 50%    Time 6    Period Weeks    Status On-going      PT LONG TERM GOAL #3   Title Patient will be able to ambulate at least 300 feet during 2MWT with LRAD to demonstrate improved ability to perform functional mobility and associated tasks.    Baseline Current 260 with straight cane    Time 6    Period Weeks    Status On-going      PT LONG TERM  GOAL #4   Title Patient will be able to perform stand x 5 in < 15 seconds to demonstrate improvement in functional mobility and reduced risk for falls.    Baseline Current 5 in 14.46 sec no UE    Time 6    Period Weeks    Status Achieved                   Plan - 07/25/21 1002     Clinical Impression Statement Patient continues to ambulate with antalgic, crouched gait with SPC due to pain. Patient continues to have intermittent pain with LLE weightbearing having to shake LE to "get it back in place". Patient making slow/minimal progress with symptoms since beginning therapy. Continued with hip and core strengthening today which patient tolerates well. Patient given cueing for glute activation throughout session. Worked on gait training with frequent verbal cueing for glute activation on stance phase with fair carry over. Patient will continue to benefit from skilled physical therapy in order to reduce impairment and improve function.    Personal Factors and Comorbidities Comorbidity 3+    Examination-Activity Limitations Bed Mobility;Sit;Stairs;Stand;Transfers;Locomotion Level    Examination-Participation Restrictions Yard Work;Community Activity    Stability/Clinical Decision Making Stable/Uncomplicated    Rehab Potential Fair    PT Frequency 2x / week    PT Duration 6 weeks    PT Treatment/Interventions ADLs/Self Care Home Management;Aquatic Therapy;Biofeedback;Cryotherapy;Electrical Stimulation;Therapeutic exercise;Orthotic Fit/Training;Compression bandaging;Scar mobilization;Balance training;Contrast Bath;DME Instruction;Passive range of motion;Visual/perceptual remediation/compensation;Vasopneumatic Device;Taping;Splinting;Manual techniques;Patient/family education;Functional mobility training;Therapeutic activities;Ultrasound;Parrafin;Fluidtherapy;Moist Heat;Iontophoresis '4mg'$ /ml Dexamethasone;Stair training;Gait training;Neuromuscular re-education;Dry  needling;Energy  conservation;Spinal Manipulations;Joint Manipulations    PT Next Visit Plan Progress functional strengtheing.  Progress gait, balance and core/hip strengthening.  Manual as needed.    PT Home Exercise Plan 06/07/21: ab set, march supine. SLR, bridge, clam; 06/10/21: STS, sidelying ABD, heel/toe raises.6/21 Owen hip abd, mini squats; 7/7: decompression exercises posture 8/1 row, extension    Consulted and Agree with Plan of Care Family member/caregiver;Patient    Family Member Consulted Wife             Patient will benefit from skilled therapeutic intervention in order to improve the following deficits and impairments:  Increased fascial restricitons, Decreased range of motion, Abnormal gait, Decreased mobility, Decreased strength, Decreased balance, Decreased activity tolerance, Impaired perceived functional ability, Pain, Difficulty walking, Improper body mechanics, Impaired flexibility  Visit Diagnosis: Difficulty in walking, not elsewhere classified  Pain in left hip  Muscle weakness (generalized)  Pain in left leg     Problem List Patient Active Problem List   Diagnosis Date Noted   Morning headache 04/04/2021   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

## 2021-07-25 NOTE — Patient Instructions (Signed)
Access Code: [email or web address] URL: [email or web address] Date: 07/25/2021 Prepared by: [facility] Alphonsus Doyel  Exercises Standing Shoulder Row with Anchored Resistance - 1 x daily - 7 x weekly - 2 sets - 20 reps Shoulder extension with resistance - Neutral - 1 x daily - 7 x weekly - 2 sets - 20 reps

## 2021-07-27 ENCOUNTER — Other Ambulatory Visit: Payer: Self-pay

## 2021-07-27 ENCOUNTER — Ambulatory Visit (HOSPITAL_COMMUNITY): Payer: Medicare Other

## 2021-07-27 ENCOUNTER — Encounter (HOSPITAL_COMMUNITY): Payer: Self-pay

## 2021-07-27 DIAGNOSIS — M25552 Pain in left hip: Secondary | ICD-10-CM

## 2021-07-27 DIAGNOSIS — R262 Difficulty in walking, not elsewhere classified: Secondary | ICD-10-CM | POA: Diagnosis not present

## 2021-07-27 DIAGNOSIS — M6281 Muscle weakness (generalized): Secondary | ICD-10-CM

## 2021-07-27 DIAGNOSIS — M79605 Pain in left leg: Secondary | ICD-10-CM

## 2021-07-27 NOTE — Therapy (Signed)
[facility] [address] [place], [place], [address] Phone: [phone]   Fax:  [phone]  Physical Therapy Treatment  Patient Details  Name: Kyle Owen MRN: [id number] Date of Birth: 1941-04-29 Referring Provider (PT): [name] MD   Encounter Date: 07/27/2021   PT End of Session - 07/27/21 1554     Visit Number 15    Number of Visits 18    Date for PT Re-Evaluation 08/03/21    Authorization Type [employer] Medicare    Progress Note Due on Visit 18    PT Start Time 1534    PT Stop Time 1616    PT Time Calculation (min) 42 min    Equipment Utilized During Treatment Gait belt    Activity Tolerance Patient tolerated treatment well    Behavior During Therapy WFL for tasks assessed/performed             Past Medical History:  Diagnosis Date   Arthritis    DJD right knee   Benign prostatic hypertrophy with urinary frequency    Bladder cancer ([place])    Complication of anesthesia    gets combative in recovery   COPD with emphysema ([place])    Depression    Dyspnea    when walking   Dysrhythmia    a fib   History of colon polyps    History of kidney stones    History of loop recorder    loop recorder in place battery is dead has not had changed due to covid   History of pneumonia    hx Recurrent -- last bout Dec 2016 CAP-- per pt resolved   History of TIA (transient ischemic attack) no residual per pt   per neurologist note (dr Leta Baptist 11-08-2015) dx cryptogenic TIA versus arrhythia versus dysautonomia (right ophthalmic artery TIA and x3 posterior circulation TIAs   Hyperlipidemia    Hypertension    Multinodular thyroid    per pathology report 11-12-2014 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   Pulmonary nodule, left    left lower lobe x2 per CT 01-20-2016   Renal cyst, left    Stroke [facility])    TIA's   Thoracic aortic aneurysm without rupture ([place])    ascending -- ct chest 01/10/16  4.8cm   Wears hearing aid    bilateral-- wear at times    Past Surgical History:  Procedure Laterality Date   ANTERIOR CERVICAL DECOMP/DISCECTOMY FUSION N/A 04/03/2017   Procedure: Cervical three-four, Cervical four-five Anterior cervical decompression/discectomy/fusion;  Surgeon: Erline Levine, MD;  Location: [place];  Service: Neurosurgery;  Laterality: N/A;   CATARACT EXTRACTION W/ INTRAOCULAR LENS IMPLANT Right 2016   CATARACT EXTRACTION W/PHACO  12/16/2012   Procedure: CATARACT EXTRACTION PHACO AND INTRAOCULAR LENS PLACEMENT ([place]);  Surgeon: Tonny Branch, MD;  Location[facility];  Service: Ophthalmology;  Laterality: Left;  CDE:17.31   COLONOSCOPY  10/03/2011   Procedure: COLONOSCOPY;  Surgeon: Jamesetta So;  [facility];  Service: Gastroenterology;  Laterality: N/A;   CYSTOSCOPY/RETROGRADE/URETEROSCOPY/STONE EXTRACTION WITH BASKET Right 03/29/2020   Procedure: CYSTOSCOPY/RETROGRADE/URETEROSCOPY/STONE EXTRACTION WITH BASKET;  Surgeon: Franchot Gallo, MD;  Location: [facility];  Service: Urology;  Laterality:  Right;  1 HR   DECOMPRESSIOIN ULNAR NERVE AND CUBITAL TUNNEL RELEASE Left 07-14-2009   elbow   EP IMPLANTABLE DEVICE N/A 08/10/2015   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   HOLMIUM LASER APPLICATION Right [id number]   Procedure: HOLMIUM LASER APPLICATION;  Surgeon: Franchot Gallo, MD;  [facility];  Service: Urology;  Laterality: Right;   INGUINAL HERNIA REPAIR Right 1990's   KNEE ARTHROSCOPY Right 2015   LEFT CUBITAL TUNNEL RELEASE     POSTERIOR LUMBAR FUSION  2014   X2   SHOULDER ARTHROSCOPY Left 1990's   TEE WITHOUT CARDIOVERSION N/A 07/27/2015   Procedure: TRANSESOPHAGEAL ECHOCARDIOGRAM (TEE);   Surgeon: Lelon Perla, MD;  Location: [facility];  Service: Cardiovascular;  Laterality: N/A;   normal LV function, ef 55-60%,  mild AR and MR, mild dilated ascending aorta (4.2cm),  mild to moderate atherosclerosis  descending aorta,  mild to moderate TR,  negative saline microcavitation study   THYROIDECTOMY Right 01/20/2015   Procedure: RIGHT THYROIDECTOMY;  Surgeon: Ascencion Dike, MD;  Location: [facility];  Service: ENT;  Laterality: Right;   TONSILLECTOMY  as child   TOTAL HIP ARTHROPLASTY Left 12/14/2020   Procedure: TOTAL HIP ARTHROPLASTY ANTERIOR APPROACH;  Surgeon: [name], MD;  [facility];  Service: Orthopedics;  Laterality: Left;   TRANSURETHRAL RESECTION OF BLADDER TUMOR N/A 05/15/2016   Procedure: TRANSURETHRAL RESECTION OF BLADDER TUMOR (TURBT) AND INSTILLATION OF EPIRUBICIN;  Surgeon: Franchot Gallo, MD;  Location: [facility];  Service: Urology;  Laterality: N/A;   TRANSURETHRAL RESECTION OF BLADDER TUMOR N/A 03/29/2020   Procedure: TRANSURETHRAL RESECTION OF BLADDER TUMOR (TURBT);  Surgeon: Franchot Gallo, MD;  Location[facility];  Service: Urology;  Laterality: N/A;    There were no vitals filed for this visit.   Subjective Assessment - 07/27/21 1551     Subjective Pt stated he feels balance is improving some.  Walked around majority of day yesterday without AD, increased pain following.  Increased pain wiht movements 1-2/10 standing still and 4/10 walking    Pertinent History LT THA, lumbar fusion, cervical fusion    Patient Stated Goals Be able to walk without pain/ cane    Currently in Pain? Yes    Pain Score 4     Pain Location Back   back and Lt hip   Pain Orientation Lower;Left    Pain Descriptors / Indicators Aching;Sore    Pain Type Chronic pain    Pain Onset More than a month ago    Pain Frequency Intermittent    Aggravating Factors  walking    Pain Relieving Factors laying down    Effect of Pain on Daily Activities limits                                [facility] Adult PT Treatment/Exercise - 07/27/21 0001       Ambulation/Gait   Ambulation/Gait Yes    Ambulation/Gait Assistance 5: Supervision    Ambulation Distance (Feet) 496 Feet   2MWT 447f; 80 wihtout AD down long hallway   Assistive device Straight cane    Gait Comments 2MWT;  no AD wiht cueing for heel to toe and equal stride length to improve Lt WB      Lumbar Exercises: Machines for Strengthening   Other Lumbar Machine Exercise Body Craft 2Pl 5RT retro/forward and sidestep      Lumbar Exercises: Standing  Functional Squats 10 reps    Functional Squats Limitations 2 sets front of chair    Other Standing Lumbar Exercises tandem stance with Rt foot on 8in step 2x 30"    Other Standing Lumbar Exercises foam partial tandem stance on foam intermittent HHA, ability to stand wiht out HHA ~ 10"                      PT Short Term Goals - 07/06/21 1403       PT SHORT TERM GOAL #1   Title Patient will be independent with initial HEP and self-management strategies to improve functional outcomes    Baseline "Doing most of the time"    Time 3    Period Weeks    Status Achieved    Target Date 06/22/21               PT Long Term Goals - 07/06/21 1402       PT LONG TERM GOAL #1   Title Patient will improve FOTO score to predicted value to indicate improvement in functional outcomes    Baseline Current 47%    Time 6    Period Weeks    Status Achieved      PT LONG TERM GOAL #2   Title Patient will report at least 60% overall improvement in subjective complaint to indicate improvement in ability to perform ADLs.    Baseline Reports 50%    Time 6    Period Weeks    Status On-going      PT LONG TERM GOAL #3   Title Patient will be able to ambulate at least 300 feet during 2MWT with LRAD to demonstrate improved ability to perform functional mobility and associated tasks.    Baseline Current 260 with straight cane     Time 6    Period Weeks    Status On-going      PT LONG TERM GOAL #4   Title Patient will be able to perform stand x 5 in < 15 seconds to demonstrate improvement in functional mobility and reduced risk for falls.    Baseline Current 5 in 14.46 sec no UE    Time 6    Period Weeks    Status Achieved                   Plan - 07/27/21 1841     Clinical Impression Statement Pt continues to ambulate with antalgic, crouched gait with and without AD due to pain with Lt LE weight bearing.  Improved gait mechanics following cueing to improve stance phase, heel to toe mechanics and increased Rt LE stride length.  Added gait associated strengthening exercises with bodycraft machine and improve mechanics noted following.  Progressed to dynamic surface with tandem stance that pt found challenging, constant verbal cueing for posture and core sets to improve static balance, intermittent HHA required.    Personal Factors and Comorbidities Comorbidity 3+    Examination-Activity Limitations Bed Mobility;Sit;Stairs;Stand;Transfers;Locomotion Level    Examination-Participation Restrictions Yard Work;Community Activity    Stability/Clinical Decision Making Stable/Uncomplicated    Clinical Decision Making Low    Rehab Potential Fair    PT Frequency 2x / week    PT Duration 6 weeks    PT Treatment/Interventions ADLs/Self Care Home Management;Aquatic Therapy;Biofeedback;Cryotherapy;Electrical Stimulation;Therapeutic exercise;Orthotic Fit/Training;Compression bandaging;Scar mobilization;Balance training;Contrast Bath;DME Instruction;Passive range of motion;Visual/perceptual remediation/compensation;Vasopneumatic Device;Taping;Splinting;Manual techniques;Patient/family education;Functional mobility training;Therapeutic activities;Ultrasound;Parrafin;Fluidtherapy;Moist Heat;Iontophoresis '4mg'$ /ml Dexamethasone;Stair training;Gait training;Neuromuscular re-education;Dry needling;Energy conservation;Spinal  Manipulations;Joint Manipulations  PT Next Visit Plan Progress functional strengtheing.  Progress gait, balance and core/hip strengthening.  Manual as needed.    PT Home Exercise Plan 06/07/21: ab set, march supine. SLR, bridge, clam; 06/10/21: STS, sidelying ABD, heel/toe raises.6/21 standing hip abd, mini squats; 7/7: decompression exercises posture 8/1 row, extension    Consulted and Agree with Plan of Care Patient             Patient will benefit from skilled therapeutic intervention in order to improve the following deficits and impairments:  Increased fascial restricitons, Decreased range of motion, Abnormal gait, Decreased mobility, Decreased strength, Decreased balance, Decreased activity tolerance, Impaired perceived functional ability, Pain, Difficulty walking, Improper body mechanics, Impaired flexibility  Visit Diagnosis: Difficulty in walking, not elsewhere classified  Pain in left hip  Muscle weakness (generalized)  Pain in left leg     Problem List Patient Active Problem List   Diagnosis Date Noted   Morning headache 04/04/2021   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   COPD with chronic bronchitis and emphysema ([place]) 09/22/2020   Penile pain 04/07/2020   Balanitis 04/07/2020   Cervical myelopathy ([place]) 04/03/2017   Thoracic ascending aortic aneurysm ([place]) 05/09/2016   Thoracic aortic aneurysm without rupture ([place]) 01/17/2016   Paroxysmal atrial fibrillation ([place]) 12/17/2015   S/P partial thyroidectomy 01/20/2015   Ihor Austin, LPTA/CLT; CBIS [phone]  Aldona Lento 07/27/2021, 6:48 PM  [facility] [address] [place], [place], [address] Phone: [phone]   Fax:  [phone]  Name: Kyle Owen MRN: [id number] Date of Birth: 1941-04-24

## 2021-08-02 ENCOUNTER — Other Ambulatory Visit: Payer: Self-pay

## 2021-08-02 ENCOUNTER — Encounter (HOSPITAL_COMMUNITY): Payer: Self-pay

## 2021-08-02 ENCOUNTER — Ambulatory Visit (HOSPITAL_COMMUNITY): Payer: Medicare Other

## 2021-08-02 VITALS — HR 68

## 2021-08-02 DIAGNOSIS — R262 Difficulty in walking, not elsewhere classified: Secondary | ICD-10-CM | POA: Diagnosis not present

## 2021-08-02 DIAGNOSIS — M79605 Pain in left leg: Secondary | ICD-10-CM

## 2021-08-02 DIAGNOSIS — M25552 Pain in left hip: Secondary | ICD-10-CM

## 2021-08-02 DIAGNOSIS — M6281 Muscle weakness (generalized): Secondary | ICD-10-CM

## 2021-08-02 NOTE — Patient Instructions (Addendum)
WALKING  Walking is a great form of exercise to increase your strength, endurance and overall fitness.  A walking program can help you start slowly and gradually build endurance as you go.  Everyone's ability is different, so each person's starting point will be different.  You do not have to follow them exactly.  The are just samples. You should simply find out what's right for you and stick to that program.   In the beginning, you'll start off walking 2-3 times a day for short distances.  As you get stronger, you'll be walking further at just 1-2 times per day.  A. You Can Walk For A Certain Length Of Time Each Day    Walk 5 minutes 3 times per day.  Increase 2 minutes every 2 days (3 times per day).  Work up to 25-30 minutes (1-2 times per day).   Example:   Day 1-2 5 minutes 3 times per day   Day 7-8 12 minutes 2-3 times per day   Day 13-14 25 minutes 1-2 times per day  B. You Can Walk For a Certain Distance Each Day     Distance can be substituted for time.    Example:   3 trips to mailbox (at road)   3 trips to corner of block   3 trips around the block  Please only do the exercises that your therapist has initialed and dated  Band Walk: Side Stepping    Tie band around legs, just above knees. Step 15 feet to one side, then step back to start. Repeat ___ feet per session. Note: Small towel between band and skin eases rubbing.  http://plyo.exer.us/76   Copyright  VHI. All rights reserved.   Walking backward beside counter at sink.  SINGLE LIMB STANCE    Stance: single leg on floor. Raise leg. Hold 30 seconds. Repeat with other leg. 3 reps per set, 2 sets per day, 7 days per week. Copyright  VHI. All rights reserved.   Tandem Stance    Right foot in front of left, heel touching toe both feet "straight ahead". Stand on Foot Triangle of Support with both feet. Balance in this position 30 seconds. Do with left foot in front of right.  Copyright  VHI. All  rights reserved.   Bridging    Slowly raise buttocks from floor, keeping stomach tight. Repeat 10 times per set. Do 2 sets per session. Do 2 sessions per day.  http://orth.exer.us/1096   Copyright  VHI. All rights reserved.

## 2021-08-02 NOTE — Therapy (Addendum)
[place], [place], [address] Phone: [phone]   Fax:  [phone]  Physical Therapy Treatment  Patient Details  Name: Kyle Owen MRN: [id number] Date of Birth: 08/19/1941 Referring Provider (PT): [name] MD  PHYSICAL THERAPY DISCHARGE SUMMARY  Visits from Start of Care: 16  Current functional level related to goals / functional outcomes: See below    Remaining deficits: See below    Education / Equipment: See assessment    Patient agrees to discharge. Patient goals were partially met. Patient is being discharged due to being pleased with the current functional level.  Encounter Date: 08/02/2021   PT End of Session - 08/02/21 1126     Visit Number 16    Number of Visits 18    Date for PT Re-Evaluation 08/03/21    Authorization Type [employer] Medicare    Progress Note Due on Visit 18    PT Start Time 1043    PT Stop Time 1128    PT Time Calculation (min) 45 min    Equipment Utilized During Treatment --   SBA   Activity Tolerance Patient tolerated treatment well    Behavior During Therapy WFL for tasks assessed/performed             Past Medical History:  Diagnosis Date   Arthritis    DJD right knee   Benign prostatic hypertrophy with urinary frequency    Bladder cancer ([place])    Complication of anesthesia    gets combative in recovery   COPD with emphysema ([place])    Depression    Dyspnea    when walking   Dysrhythmia    a fib   History of colon polyps    History of kidney stones    History of loop recorder    loop recorder in place battery is dead has not had changed due to covid   History of pneumonia    hx Recurrent -- last bout Dec 2016 CAP-- per pt resolved   History of TIA (transient ischemic attack) no residual per pt   per neurologist note (dr Leta Baptist 11-08-2015) dx cryptogenic TIA versus arrhythia versus dysautonomia (right ophthalmic artery TIA and x3 posterior  circulation TIAs   Hyperlipidemia    Hypertension    Multinodular thyroid    per pathology report 11-12-2014 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   Pulmonary nodule, left    left lower lobe x2 per CT 01-20-2016   Renal cyst, left    Stroke [facility])    TIA's   Thoracic aortic aneurysm without rupture ([place])    ascending -- ct chest 01/10/16  4.8cm   Wears hearing aid    bilateral-- wear at times    Past Surgical History:  Procedure Laterality Date   ANTERIOR CERVICAL DECOMP/DISCECTOMY FUSION N/A 04/03/2017   Procedure: Cervical three-four, Cervical four-five Anterior cervical decompression/discectomy/fusion;  Surgeon: Erline Levine, MD;  Location: [facility];  Service: Neurosurgery;  Laterality: N/A;   CATARACT EXTRACTION W/ INTRAOCULAR LENS IMPLANT Right 2016   CATARACT EXTRACTION W/PHACO  12/16/2012   Procedure: CATARACT EXTRACTION PHACO AND INTRAOCULAR LENS PLACEMENT ([place]);  Surgeon: Tonny Branch, MD;  [facility];  Service: Ophthalmology;  Laterality: Left;  CDE:17.31   COLONOSCOPY  10/03/2011   Procedure: COLONOSCOPY;  Surgeon: Jamesetta So;  [facility];  Service: Gastroenterology;  Laterality: N/A;   CYSTOSCOPY/RETROGRADE/URETEROSCOPY/STONE EXTRACTION WITH BASKET Right 03/29/2020   Procedure: CYSTOSCOPY/RETROGRADE/URETEROSCOPY/STONE EXTRACTION WITH BASKET;  Surgeon: Franchot Gallo, MD;  Location: [facility];  Service: Urology;  Laterality: Right;  1 HR   DECOMPRESSIOIN ULNAR NERVE AND CUBITAL TUNNEL RELEASE Left 07-14-2009   elbow   EP IMPLANTABLE DEVICE N/A 08/10/2015   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   HOLMIUM LASER APPLICATION Right 06/25/5365   Procedure: HOLMIUM LASER  APPLICATION;  Surgeon: Franchot Gallo, MD;  [facility];  Service: Urology;  Laterality: Right;   INGUINAL HERNIA REPAIR Right 1990's   KNEE ARTHROSCOPY Right 2015   LEFT CUBITAL TUNNEL RELEASE     POSTERIOR LUMBAR FUSION  2014   X2   SHOULDER ARTHROSCOPY Left 1990's   TEE WITHOUT CARDIOVERSION N/A 07/27/2015   Procedure: TRANSESOPHAGEAL ECHOCARDIOGRAM (TEE);  Surgeon: Lelon Perla, MD;  Location: [facility];  Service: Cardiovascular;  Laterality: N/A;   normal LV function, ef 55-60%,  mild AR and MR, mild dilated ascending aorta (4.2cm),  mild to moderate atherosclerosis  descending aorta,  mild to moderate TR,  negative saline microcavitation study   THYROIDECTOMY Right 01/20/2015   Procedure: RIGHT THYROIDECTOMY;  Surgeon: Ascencion Dike, MD;  Location[facility];  Service: ENT;  Laterality: Right;   TONSILLECTOMY  as child   TOTAL HIP ARTHROPLASTY Left 12/14/2020   Procedure: TOTAL HIP ARTHROPLASTY ANTERIOR APPROACH;  Surgeon: [name], MD;  [facility];  Service: Orthopedics;  Laterality: Left;   TRANSURETHRAL RESECTION OF BLADDER TUMOR N/A 05/15/2016   Procedure: TRANSURETHRAL RESECTION OF BLADDER TUMOR (TURBT) AND INSTILLATION OF EPIRUBICIN;  Surgeon: Franchot Gallo, MD;  Location: [facility];  Service: Urology;  Laterality: N/A;   TRANSURETHRAL RESECTION OF BLADDER TUMOR N/A 03/29/2020   Procedure: TRANSURETHRAL RESECTION OF BLADDER TUMOR (TURBT);  Surgeon: Franchot Gallo, MD;  [facility];  Service: Urology;  Laterality: N/A;    Vitals:   08/02/21 1050  Pulse: 68  SpO2: 94%     Subjective Assessment - 08/02/21 1048     Subjective Pt stated he continues to have some pain wiht weight bearing, pain scale 4/10.    Pertinent History LT THA, lumbar fusion, cervical fusion    Patient Stated Goals Be able to walk without pain/ cane    Currently in Pain? Yes    Pain Score 4     Pain Location Back    Pain Orientation Left;Lower    Pain  Descriptors / Indicators Aching;Sore    Pain Type Chronic pain    Pain Onset More than a month ago    Pain Frequency Intermittent    Aggravating Factors  walking    Pain Relieving Factors laying down    Effect of Pain on Daily Activities limits                [facility] PT Assessment - 08/02/21 0001       Assessment   Medical Diagnosis Lumbago s/p LT THA    Referring Provider (PT) [name] MD    Onset Date/Surgical Date 12/14/20    Prior Therapy Yes      Precautions   Precautions Fall      Observation/Other Assessments   Focus on Therapeutic Outcomes (FOTO)  48% functional  was 47% last taken, 28% initial eval     Strength   Right Hip Flexion 5/5    Right Hip Extension 4/5    Right Hip ABduction 4/5    Left Hip Flexion 5/5    Left Hip Extension 4/5    Left Hip ABduction 4/5    Right/Left Knee Right;Left    Right Knee Flexion 5/5    Right Knee Extension --   was 4+/5   Left Knee Flexion 5/5    Left Knee Extension --   was 4+/5   Right/Left Ankle Right;Left    Right Ankle Dorsiflexion --   was 4+   Left Ankle Dorsiflexion 5/5      Transfers   Five time sit to stand comments  --   was 14.46"     Ambulation/Gait   Ambulation/Gait Yes    Ambulation/Gait Assistance 5: Supervision                           [facility] Adult PT Treatment/Exercise - 08/02/21 0001       Ambulation/Gait   Ambulation Distance (Feet) 356 Feet   was 190 initial eval   Assistive device Straight cane;None    Gait Comments 2MWT, reports of increase SOB following      Lumbar Exercises: Standing   Functional Squats 10 reps    Functional Squats Limitations 2 sets front of chair    Other Standing Lumbar Exercises tandem stance with Rt foot on 8in step 2x 30"    Other Standing Lumbar Exercises sidestep and retro gait 2RT      Lumbar Exercises: Seated   Sit to Stand 5 reps    Sit to Stand Limitations 2 sets                      PT Short Term Goals - 08/02/21  1102       PT SHORT TERM GOAL #1   Title Patient will be independent with initial HEP and self-management strategies to improve functional outcomes    Baseline 08/02/21:  Reports compliance wiht HEP daily    Status Achieved      PT SHORT TERM GOAL #2   Title Patient will demonstrate bed mobility with Supervision to  decrease level of assistance from caregivers    Baseline 08/02/21:  Reports independence with bed mobility    Status Achieved      PT SHORT TERM GOAL #3   Title Patient will demonstrate transfers with supervision to decrease level of care    Baseline 08/02/21:  Reports independence with transfers, does require some supervision getting out of bathtub for safety.  Reports ability to complete without assistance    Status Achieved      PT SHORT TERM GOAL #4   Title Patient will demonstrate improved ambulation tolerance as evidenced by distacne of 200 ft during 2MWT using least restrictive AD    Baseline 08/02/21: 89f no AD 2MWT; was  75 ft w/ RW and antalgic pattern with step-to pattern    Status Achieved      PT SHORT TERM GOAL #5   Title Patient will be able to get socks and shoes on without assist to return to optimal funciton.    Baseline 08/02/21:  Reports using butler to assist with compression socks.  Was:  can get normal socks and shoes on but can't get compression socks on (couldn't do prior to surgery)    Status  Achieved               PT Long Term Goals - 08/02/21 1107       PT LONG TERM GOAL #1   Title Patient will improve FOTO score to predicted value to indicate improvement in functional outcomes    Baseline FOTO score 48.03% was 28%    Status Achieved      PT LONG TERM GOAL #2   Title Patient will report at least 60% overall improvement in subjective complaint to indicate improvement in ability to perform ADLs.    Baseline 08/02/21: reports 50% improvements    Status On-going      PT LONG TERM GOAL #3   Title Patient will be able to ambulate at least 300  feet during 2MWT with LRAD to demonstrate improved ability to perform functional mobility and associated tasks.    Baseline 08/02/21: 89f 2MWT; Current 260 with straight cane    Status Achieved      PT LONG TERM GOAL #4   Title Patient will be able to perform stand x 5 in < 15 seconds to demonstrate improvement in functional mobility and reduced risk for falls.    Baseline 08/02/21: 5STS 11.90"; Current 5 in 14.46 sec no UE    Status Achieved                   Plan - 08/02/21 1140     Clinical Impression Statement Reviewed goals with all STGs and 3/4 LTGs met.  Pt presents with increased cadence during 2MWT and reports ability to ambulate without AD periodically at home, does continue to c/o increased pain without AD, therapist encouraged to use for pain control.  Improved self perceived functional abilities noted with FOTO score improved to 48%, was 28% initial eval.  Gluteal strengthening has improved, noted by MMT and ability to complete 5STS in 11.90".  Pt reports he feels 50% improvements since began therapy.  Discussed continued HEP and beginning silver sneakers at [employer]  Pt encouraged to complete daily walking program and continue balance activities at home wiht verbalized understanding.    Personal Factors and Comorbidities Comorbidity 3+    Examination-Activity Limitations Bed Mobility;Sit;Stairs;Stand;Transfers;Locomotion Level    Examination-Participation Restrictions Yard Work;Community Activity    Stability/Clinical Decision Making Stable/Uncomplicated    Clinical Decision Making Low    Rehab Potential Fair    PT Frequency 2x / week    PT Duration 6 weeks    PT Treatment/Interventions ADLs/Self Care Home Management;Aquatic Therapy;Biofeedback;Cryotherapy;Electrical Stimulation;Therapeutic exercise;Orthotic Fit/Training;Compression bandaging;Scar mobilization;Balance training;Contrast Bath;DME Instruction;Passive range of motion;Visual/perceptual  remediation/compensation;Vasopneumatic Device;Taping;Splinting;Manual techniques;Patient/family education;Functional mobility training;Therapeutic activities;Ultrasound;Parrafin;Fluidtherapy;Moist Heat;Iontophoresis 435mml Dexamethasone;Stair training;Gait training;Neuromuscular re-education;Dry needling;Energy conservation;Spinal Manipulations;Joint Manipulations    PT Next Visit Plan DC to HEP.    Consulted and Agree with Plan of Care Patient;Family member/caregiver    Family Member Consulted Wife             Patient will benefit from skilled therapeutic intervention in order to improve the following deficits and impairments:  Increased fascial restricitons, Decreased range of motion, Abnormal gait, Decreased mobility, Decreased strength, Decreased balance, Decreased activity tolerance, Impaired perceived functional ability, Pain, Difficulty walking, Improper body mechanics, Impaired flexibility  Visit Diagnosis: Difficulty in walking, not elsewhere classified  Muscle weakness (generalized)  Pain in left leg  Pain in left hip     Problem List Patient Active Problem List   Diagnosis Date Noted   Morning headache 04/04/2021   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 [place], [place], [address] Phone: [phone]   Fax:  [phone]  Name: Kyle Owen MRN: [id number] Date of Birth: [birth year]

## 2021-08-05 ENCOUNTER — Ambulatory Visit (HOSPITAL_COMMUNITY): Payer: Medicare Other

## 2021-10-06 ENCOUNTER — Other Ambulatory Visit: Payer: Self-pay

## 2021-10-06 ENCOUNTER — Encounter: Payer: Self-pay | Admitting: Cardiology

## 2021-10-06 ENCOUNTER — Ambulatory Visit: Payer: Medicare Other | Admitting: Cardiology

## 2021-10-06 VITALS — BP 124/80 | HR 54 | Ht 73.0 in | Wt 217.0 lb

## 2021-10-06 DIAGNOSIS — Z8673 Personal history of transient ischemic attack (TIA), and cerebral infarction without residual deficits: Secondary | ICD-10-CM

## 2021-10-06 DIAGNOSIS — Z7901 Long term (current) use of anticoagulants: Secondary | ICD-10-CM

## 2021-10-06 DIAGNOSIS — C679 Malignant neoplasm of bladder, unspecified: Secondary | ICD-10-CM

## 2021-10-06 DIAGNOSIS — Z01812 Encounter for preprocedural laboratory examination: Secondary | ICD-10-CM

## 2021-10-06 DIAGNOSIS — I7121 Aneurysm of the ascending aorta, without rupture: Secondary | ICD-10-CM

## 2021-10-06 DIAGNOSIS — I480 Paroxysmal atrial fibrillation: Secondary | ICD-10-CM | POA: Diagnosis not present

## 2021-10-06 NOTE — Patient Instructions (Addendum)
Medication Instructions:  The current medical regimen is effective;  continue present plan and medications.  *If you need a refill on your cardiac medications before your next appointment, please call your pharmacy*  Lab Work: None  Testing/Procedures: CT Angiography (CTA), is a special type of CT scan that uses a computer to produce multi-dimensional views of major blood vessels throughout the body. In CT angiography, a contrast material is injected through an IV to help visualize the blood vessels.  Follow-Up: At [facility], you and your health needs are our priority.  As part of our continuing mission to provide you with exceptional heart care, we have created designated Provider Care Teams.  These Care Teams include your primary Cardiologist (physician) and Advanced Practice Providers (APPs -  Physician Assistants and Nurse Practitioners) who all work together to provide you with the care you need, when you need it.  We recommend signing up for the patient portal called "MyChart".  Sign up information is provided on this After Visit Summary.  MyChart is used to connect with patients for Virtual Visits (Telemedicine).  Patients are able to view lab/test results, encounter notes, upcoming appointments, etc.  Non-urgent messages can be sent to your provider as well.   To learn more about what you can do with MyChart, go to [email or web address].    Your next appointment:   1 year(s)  The format for your next appointment:   In Person  Provider:   Candee Furbish, MD   Thank you for choosing [facility]!!

## 2021-10-06 NOTE — Assessment & Plan Note (Addendum)
Currently on Eliquis for stroke prevention.  Has a history of cryptogenic stroke.  He had about 3% burden on loop recorder previously.  Dr. Rayann Heman had been monitoring.  Continue with verapamil 240 mg CR daily.  Loop recorder is at end-of-life.  I asked him if he would like to get it removed.  He would hold off on this at this time.

## 2021-10-06 NOTE — Assessment & Plan Note (Signed)
Dr. Diona Fanti has been monitoring, stable.

## 2021-10-06 NOTE — Assessment & Plan Note (Signed)
Proceeding with Eliquis.  No bleeding.  No change in medical management.  Continue to monitor hemoglobin and creatinine.

## 2021-10-06 NOTE — Assessment & Plan Note (Signed)
4.7 cm on 2021 November CT scan.  We will go ahead and order another CT of chest with contrast for aorta measurement unless order is already in.  Continue to manage blood pressure, statin.  He understands that if any severe symptoms were to develop to promptly report to the emergency department.

## 2021-10-06 NOTE — Progress Notes (Signed)
Cardiology Office Note:    Date:  10/06/2021   ID:  Kyle Owen, DOB February 12, 1941, MRN [id number]  PCP:  Asencion Noble, MD  [facility] Cardiologist:  Candee Furbish, MD  [facility] Electrophysiologist:  None   Referring MD: Asencion Noble, MD    History of Present Illness:    Kyle Owen is a 80 y.o. male here for the follow-up of atrial fibrillation.  Hx of ascending aortic aneurysm 4.7 cm originally noted in 2017 as well as 2021 CT images personally reviewed and interpreted.  Dr. Darcey Nora had been following as well.  Also has paroxysmal atrial fibrillation-on Eliquis.  No major signs of bleeding.  Loop implanted expired battery.  Does not wish to pursue explantation.  He also has been taking atorvastatin with no myalgias.  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    Today: He is accompanied by a family member. Overall he is not feeling well. He is suffering from left hip pain from his previous left total hip arthroplasty. To walk he needs to utilize a cane. Had 2 rounds of physical therapy. 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  Goes to the gym a few days every week, and was able to using a chainsaw to cut trees in the yard.  Two days ago his blood pressure was 962 systolic.  He denies any palpitations, chest pain, or shortness of breath. No lightheadedness, headaches, syncope, orthopnea, or PND. Also has no lower extremity edema.   Past Medical History:  Diagnosis Date   Arthritis    DJD right knee   Benign prostatic hypertrophy with urinary frequency    Bladder cancer (HCC)    Complication of anesthesia    gets combative in recovery   COPD with emphysema (HCC)    Depression    Dyspnea    when walking   Dysrhythmia    a fib   History of colon polyps    History of kidney stones    History of loop recorder    loop  recorder in place battery is dead has not had changed due to covid   History of pneumonia    hx Recurrent -- last bout Dec 2016 CAP-- per pt resolved   History of TIA (transient ischemic attack) no residual per pt   per neurologist note (dr Leta Baptist 11-08-2015) dx cryptogenic TIA versus arrhythia versus dysautonomia (right ophthalmic artery TIA and x3 posterior circulation TIAs   Hyperlipidemia    Hypertension    Multinodular thyroid    per pathology report 11-12-2014 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   Pulmonary nodule, left    left lower lobe x2 per CT 01-20-2016   Renal cyst, left    Stroke [facility])    TIA's   Thoracic aortic aneurysm without rupture    ascending -- ct chest 01/10/16  4.8cm   Wears hearing aid    bilateral-- wear at times    Past Surgical History:  Procedure Laterality Date   ANTERIOR CERVICAL DECOMP/DISCECTOMY FUSION N/A 04/03/2017   Procedure:  Cervical three-four, Cervical four-five Anterior cervical decompression/discectomy/fusion;  Surgeon: Erline Levine, MD;  Location: [place];  Service: Neurosurgery;  Laterality: N/A;   CATARACT EXTRACTION W/ INTRAOCULAR LENS IMPLANT Right 2016   CATARACT EXTRACTION W/PHACO  12/16/2012   Procedure: CATARACT EXTRACTION PHACO AND INTRAOCULAR LENS PLACEMENT ([place]);  Surgeon: Tonny Branch, MD;  Location: [facility];  Service: Ophthalmology;  Laterality: Left;  CDE:17.31   COLONOSCOPY  10/03/2011   Procedure: COLONOSCOPY;  Surgeon: Jamesetta So;  [facility];  Service: Gastroenterology;  Laterality: N/A;   CYSTOSCOPY/RETROGRADE/URETEROSCOPY/STONE EXTRACTION WITH BASKET Right 03/29/2020   Procedure: CYSTOSCOPY/RETROGRADE/URETEROSCOPY/STONE EXTRACTION WITH  BASKET;  Surgeon: Franchot Gallo, MD;  Location: [facility];  Service: Urology;  Laterality: Right;  1 HR   DECOMPRESSIOIN ULNAR NERVE AND CUBITAL TUNNEL RELEASE Left 07-14-2009   elbow   EP IMPLANTABLE DEVICE N/A 08/10/2015   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   HOLMIUM LASER APPLICATION Right 6/0/4540   Procedure: HOLMIUM LASER APPLICATION;  Surgeon: Franchot Gallo, MD;  Location: [facility];  Service: Urology;  Laterality: Right;   INGUINAL HERNIA REPAIR Right 1990's   KNEE ARTHROSCOPY Right 2015   LEFT CUBITAL TUNNEL RELEASE     POSTERIOR LUMBAR FUSION  2014   X2   SHOULDER ARTHROSCOPY Left 1990's   TEE WITHOUT CARDIOVERSION N/A 07/27/2015   Procedure: TRANSESOPHAGEAL ECHOCARDIOGRAM (TEE);  Surgeon: Lelon Perla, MD;  Location: [facility];  Service: Cardiovascular;  Laterality: N/A;   normal LV function, ef 55-60%,  mild AR and MR, mild dilated ascending aorta (4.2cm),  mild to moderate atherosclerosis  descending aorta,  mild to moderate TR,  negative saline microcavitation study   THYROIDECTOMY Right 01/20/2015   Procedure: RIGHT THYROIDECTOMY;  Surgeon: Ascencion Dike, MD;  Location: [facility];  Service: ENT;  Laterality: Right;   TONSILLECTOMY  as child   TOTAL HIP ARTHROPLASTY Left 12/14/2020   Procedure: TOTAL HIP ARTHROPLASTY ANTERIOR APPROACH;  Surgeon: [name], MD;  [facility];  Service: Orthopedics;  Laterality: Left;   TRANSURETHRAL RESECTION OF BLADDER TUMOR N/A 05/15/2016   Procedure: TRANSURETHRAL RESECTION OF BLADDER TUMOR (TURBT) AND INSTILLATION OF EPIRUBICIN;  Surgeon: Franchot Gallo, MD;  Location: [facility];  Service: Urology;  Laterality: N/A;   TRANSURETHRAL RESECTION OF BLADDER TUMOR N/A 03/29/2020   Procedure: TRANSURETHRAL RESECTION OF BLADDER TUMOR (TURBT);  Surgeon: Franchot Gallo, MD;  Location[facility];  Service: Urology;  Laterality: N/A;    Current Medications: Current Meds  Medication Sig   acetaminophen  (TYLENOL) 500 MG tablet Take 2 tablets (1,000 mg total) by mouth every 6 (six) hours.   atorvastatin (LIPITOR) 20 MG tablet TAKE 1 TABLET BY MOUTH DAILY.   Budeson-Glycopyrrol-Formoterol (BREZTRI AEROSPHERE) 160-9-4.8 MCG/ACT AERO Inhale 2 puffs into the lungs in the morning and at bedtime.   ELIQUIS 5 MG TABS tablet TAKE (1) TABLET BY MOUTH TWICE DAILY.   finasteride (PROSCAR) 5 MG tablet Take 5 mg by mouth daily.    loratadine (CLARITIN) 10 MG tablet Take 10 mg by mouth daily as needed for allergies.   Magnesium 250 MG TABS Take 500 mg by mouth daily.    OVER THE COUNTER MEDICATION Apply 1 application topically daily as needed (Joint pain). CBD cream   sertraline (ZOLOFT) 50 MG tablet Take 50 mg by mouth every morning.   VENTOLIN HFA 108 (90 BASE) MCG/ACT inhaler Inhale 2 puffs into the lungs Every 4 hours as needed for wheezing or shortness of breath.  verapamil (CALAN-SR) 240 MG CR tablet Take 240 mg by mouth daily.     Allergies:   Flomax [tamsulosin hcl] and Fentanyl   Social History   Socioeconomic History   Marital status: Married    Spouse name: Not on file   Number of children: 3   Years of education: 14   Highest education level: Not on file  Occupational History   Occupation: Pensions consultant and Production manager    Comment: retired  Tobacco Use   Smoking status: Former    Packs/day: 1.00    Years: 45.00    Pack years: 45.00    Types: Cigarettes    Quit date: 09/26/2010    Years since quitting: 11.0   Smokeless tobacco: Never  Vaping Use   Vaping Use: Never used  Substance and Sexual Activity   Alcohol use: No   Drug use: Never   Sexual activity: Not Currently  Other Topics Concern   Not on file  Social History Narrative   Widowed, lives with dog, [place] in [place], wife passed from Parkinson's disease 3 1/2 yrs ago   1 cup coffee daily   Social Determinants of Health   Financial Resource Strain: Not on file  Food Insecurity: Not on file  Transportation Needs: Not on file   Physical Activity: Not on file  Stress: Not on file  Social Connections: Not on file     Family History: The patient's family history includes Breast cancer in his sister; Stroke in his brother and sister.  ROS:   Please see the history of present illness.    (+) Left hip pain All other systems reviewed and are negative.  EKGs/Labs/Other Studies Reviewed:    CT Angio Chest/Aorta 10/26/2020: COMPARISON:  09/17/2019   FINDINGS: Cardiovascular: 4.7 cm ascending thoracic aortic aneurysm again noted, unchanged. Distal aortic arch mildly aneurysmal at 4 cm, stable. Proximal descending thoracic aorta 3 cm. No dissection. Heart is normal size. Coronary artery and aortic calcifications.   Mediastinum/Nodes: No mediastinal, hilar, or axillary adenopathy. Trachea and esophagus are unremarkable. Presumed prior right thyroidectomy. No significant findings within the left thyroid lobe.   Lungs/Pleura: Calcified granuloma in the right upper lobe. Linear scarring in the lung bases. No effusions.   Upper Abdomen: Imaging into the upper abdomen demonstrates no acute findings. Calcifications in the spleen and liver compatible with old granulomatous disease.   Musculoskeletal: Chest wall soft tissues are unremarkable. No acute bony abnormality.   Review of the MIP images confirms the above findings.   IMPRESSION: Stable 4.7 cm ascending thoracic aortic aneurysm.   Distal aortic arch aneurysmal at 4 cm, stable.   Coronary artery disease.   No acute cardiopulmonary disease.   Old granulomatous disease.   Aortic Atherosclerosis (ICD10-I70.0).  Lexiscan Myoview 06/08/2017: Nuclear stress EF: 66%. There was no ST segment deviation noted during stress. The study is normal. This is a low risk study. The left ventricular ejection fraction is hyperdynamic (>65%).   Thinning of the inferior wall at mid and basal level thought to be from extra cardiac /diaphragmatic attenuation No  ischemia and no RWMA;s EF 66%  EKG:  EKG is personally reviewed and interpreted. 10/06/2021: Sinus bradycardia. Rate 54 bpm.  10/04/2020:The ekg ordered today demonstrates sinus rhythm 63 no other abnormalities  Recent Labs: 12/06/2020: BUN 26; Creatinine, Ser 1.32; Hemoglobin 13.8; Platelets 180; Potassium 4.3; Sodium 144   Recent Lipid Panel    Component Value Date/Time   CHOL 225 (H) 06/09/2015 1040   TRIG 100 06/09/2015  1040   HDL 68 06/09/2015 1040   CHOLHDL 3.3 06/09/2015 1040   LDLCALC 137 (H) 06/09/2015 1040     Risk Assessment/Calculations:     CHA2DS2-VASc Score =    This indicates a  % annual risk of stroke. The patient's score is based upon:        Physical Exam:    VS:  BP 124/80 (BP Location: Left Arm, Patient Position: Sitting, Cuff Size: Normal)   Pulse (!) 54   Ht 6\' 1"  (1.854 m)   Wt 217 lb (98.4 kg)   SpO2 93%   BMI 28.63 kg/m     Wt Readings from Last 3 Encounters:  10/06/21 217 lb (98.4 kg)  07/05/21 223 lb 12.8 oz (101.5 kg)  04/04/21 218 lb (98.9 kg)     GEN:  Well nourished, well developed in no acute distress, utilizes a cane HEENT: Normal NECK: No JVD; No carotid bruits LYMPHATICS: No lymphadenopathy CARDIAC: RRR, no murmurs, rubs, gallops RESPIRATORY:  Clear to auscultation without rales, wheezing or rhonchi  ABDOMEN: Soft, non-tender, non-distended MUSCULOSKELETAL:  No edema; No deformity  SKIN: Warm and dry NEUROLOGIC:  Alert and oriented x 3 PSYCHIATRIC:  Normal affect   ASSESSMENT:    1. Aneurysm of ascending aorta without rupture   2. Paroxysmal atrial fibrillation (HCC)   3. Chronic anticoagulation   4. History of stroke   5. Malignant neoplasm of urinary bladder, unspecified site [facility])     PLAN:    In order of problems listed above: Thoracic ascending aortic aneurysm (HCC) 4.7 cm on 2021 November CT scan.  We will go ahead and order another CT of chest with contrast for aorta measurement unless order is already  in.  Continue to manage blood pressure, statin.  He understands that if any severe symptoms were to develop to promptly report to the emergency department.  Paroxysmal atrial fibrillation (HCC) Currently on Eliquis for stroke prevention.  Has a history of cryptogenic stroke.  He had about 3% burden on loop recorder previously.  Dr. Rayann Heman had been monitoring.  Continue with verapamil 240 mg CR daily.  Loop recorder is at end-of-life.  I asked him if he would like to get it removed.  He would hold off on this at this time.  Chronic anticoagulation Proceeding with Eliquis.  No bleeding.  No change in medical management.  Continue to monitor hemoglobin and creatinine.  History of stroke Cryptogenic stroke.  Atrial fibrillation was discovered at that time.  Disequilibrium.  For instance does not like getting on a boat.  Bladder cancer [facility]) Dr. Diona Fanti has been monitoring, stable.    Follow-up:   12 months.  Medication Adjustments/Labs and Tests Ordered: Current medicines are reviewed at length with the patient today.  Concerns regarding medicines are outlined above.  No orders of the defined types were placed in this encounter.  No orders of the defined types were placed in this encounter.   There are no Patient Instructions on file for this visit.   I,Mathew Stumpf,acting as a Education administrator for [employer], MD.,have documented all relevant documentation on the behalf of Candee Furbish, MD,as directed by  Candee Furbish, MD while in the presence of Candee Furbish, MD.  I, Candee Furbish, MD, have reviewed all documentation for this visit. The documentation on 10/06/21 for the exam, diagnosis, procedures, and orders are all accurate and complete.   Signed, Candee Furbish, MD  10/06/2021 9:58 AM    [facility]

## 2021-10-06 NOTE — Assessment & Plan Note (Signed)
Cryptogenic stroke.  Atrial fibrillation was discovered at that time.  Disequilibrium.  For instance does not like getting on a boat.

## 2021-10-10 ENCOUNTER — Other Ambulatory Visit: Payer: Self-pay

## 2021-10-10 ENCOUNTER — Encounter: Payer: Self-pay | Admitting: Pulmonary Disease

## 2021-10-10 ENCOUNTER — Ambulatory Visit: Payer: Medicare Other | Admitting: Pulmonary Disease

## 2021-10-10 DIAGNOSIS — I7121 Aneurysm of the ascending aorta, without rupture: Secondary | ICD-10-CM

## 2021-10-10 DIAGNOSIS — J449 Chronic obstructive pulmonary disease, unspecified: Secondary | ICD-10-CM

## 2021-10-10 NOTE — Assessment & Plan Note (Signed)
We discussed the use of his albuterol inhaler and seems like for his shortness of breath which recovers with resting he does not always need the rescue inhaler as much. He will continue on Breztri while he is getting this free but once this gets too expensive we can go back to Symbicort. We discussed signs and symptoms of an exacerbation

## 2021-10-10 NOTE — Patient Instructions (Addendum)
Continue on Breztri - OK to go back to symbicort if too expensive Albuterol MDI as needed only  Oxygen during sleep

## 2021-10-10 NOTE — Progress Notes (Signed)
   Subjective:    Patient ID: Kyle Owen, male    DOB: 1941/09/11, 80 y.o.   MRN: [id number]  HPI  80 yo ex-smoker for follow-up of COPD   PMH -cryptogenic TIA, atrial fibrillation on Eliquis, bladder cancer , thoracic aortic aneurysm  Chief Complaint  Patient presents with   Follow-up    SOB with exertion. Coughing has improved since last OV. Coughing only when laying down.  Using rescue inhaler more since last OV. Using oxygen at night time.    He underwent hip surgery and this is not healed well he still ambulates with a cane and has a limp. He feels more short of breath and is using his rescue inhaler more. Judithann Sauger is more expensive and he has to pay $300, fortunately he is getting this with a free coupon currently, Symbicort was only $100.  We started him on nocturnal oxygen after his last visit and nocturnal oximetry showing desaturations, this is helping, headaches are lesser in intensity and frequency, he only sleeps for 3 to 4 hours at night and then gets into a recliner  Significant tests/ events reviewed 03/2021 ONO shows desaturation less than 88% for 3 hours 46 minutes >> 2 L during sleep  CT angiogram chest 10/2020 -stable 4.7 cm aneurysm ?  Left basal bronchiectasis  CT chest 08/2019  4.7 cm ascending aortic aneurysm .  Stable calcified granuloma right apex     PFTs 10/2020 moderate airway obstruction, ratio 62, FEV1 1.83/58%, FVC 67%, TLC normal, DLCO 22.8/88%  Review of Systems Pt denies any significant  nasal congestion or excess secretions, fever, chills, sweats, unintended wt loss, pleuritic or exertional cp, orthopnea pnd or leg swelling.  Pt also denies any obvious fluctuation in symptoms with weather or environmental change or other alleviating or aggravating factors.    Pt denies any increase in rescue therapy over baseline, denies waking up needing it or having early am exacerbations or coughing/wheezing/ or dyspnea     Objective:   Physical Exam  Gen.  Pleasant, well-nourished, in no distress ENT - no thrush, no pallor/icterus,no post nasal drip Neck: No JVD, no thyromegaly, no carotid bruits Lungs: no use of accessory muscles, no dullness to percussion, Lt basal rales or rhonchi  Cardiovascular: Rhythm regular, heart sounds  normal, no murmurs or gallops, no peripheral edema Musculoskeletal: No deformities, no cyanosis or clubbing        Assessment & Plan:

## 2021-10-10 NOTE — Assessment & Plan Note (Signed)
Plan is for 1 year follow-up with CT angiogram. For his left basal crackles, we will also try to get high-resolution CT at the same time, this may be related to left basal bronchiectasis

## 2021-10-13 ENCOUNTER — Telehealth: Payer: Self-pay | Admitting: *Deleted

## 2021-10-13 NOTE — Telephone Encounter (Signed)
Spoke with daughter who reports the person who scheduled pt's CT made a copy of the results to have scanned.  Daughter reports lab was ordered by Dr Willey Blade and was drawn at [facility] in [place].

## 2021-10-13 NOTE — Telephone Encounter (Signed)
Attempted to contact pt at phone # listed.  NA - lm to call back to discuss where most recent lab work was completed.  Pt is scheduled for CT and BUN/Crea results are needed - to either be documented or la work will need to be repeated.

## 2021-10-14 NOTE — Telephone Encounter (Signed)
Spoke with Dr Ria Comment office - they were fax lab result from 09/28/21 today.

## 2021-10-17 NOTE — Telephone Encounter (Signed)
Have not received labs from Dr Ria Comment office.  Attempted to contact patient.  LM to cb to come into office for labwork or contact Dr Ria Comment office to get the results to [place] ASAP.  Requested he or his daughter c/b to discuss.

## 2021-10-19 NOTE — Telephone Encounter (Signed)
Called again to Dr Ria Comment office for lab results to be faxed.  Per Abigail Butts, she will re-fax today.

## 2021-10-19 NOTE — Telephone Encounter (Signed)
Received BMP results for pt from 09/27/2021. BUN 26 Crea 1.45

## 2021-10-19 NOTE — Telephone Encounter (Signed)
Livingston Diones, RT  Shellia Cleverly, RN Its ok. I actually called them and got a copy faxed directly to me. I faxed it in, but my faxes only go to the media tab since I am radiology staff but it is in the chart.

## 2021-10-20 ENCOUNTER — Other Ambulatory Visit: Payer: Self-pay

## 2021-10-20 ENCOUNTER — Ambulatory Visit (INDEPENDENT_AMBULATORY_CARE_PROVIDER_SITE_OTHER)
Admission: RE | Admit: 2021-10-20 | Discharge: 2021-10-20 | Disposition: A | Discharge status: Home / Self Care | Payer: Medicare Other | Source: Ambulatory Visit | Attending: Cardiology | Admitting: Cardiology

## 2021-10-20 DIAGNOSIS — I7121 Aneurysm of the ascending aorta, without rupture: Secondary | ICD-10-CM | POA: Diagnosis not present

## 2021-10-20 MED ORDER — IOHEXOL 350 MG/ML SOLN
100.0000 mL | Freq: Once | INTRAVENOUS | Status: AC | PRN
  Administered 2021-10-20: 100 mL via INTRAVENOUS

## 2021-11-01 ENCOUNTER — Telehealth: Payer: Self-pay | Admitting: Pulmonary Disease

## 2021-11-01 NOTE — Telephone Encounter (Signed)
Will send message to Dr. Elsworth Soho as an Juluis Rainier

## 2021-11-01 NOTE — Telephone Encounter (Signed)
ATC patient. LMTCB with RDS office number  

## 2021-11-02 NOTE — Telephone Encounter (Signed)
Spoke to patients wife [name] regarding CT. She voiced understanding bout what Dr. Elsworth Soho said and nothing further is needed.

## 2021-11-04 ENCOUNTER — Telehealth: Payer: Self-pay | Admitting: *Deleted

## 2021-11-04 DIAGNOSIS — I712 Thoracic aortic aneurysm, without rupture, unspecified: Secondary | ICD-10-CM

## 2021-11-04 NOTE — Telephone Encounter (Signed)
Stable ascending aortic aneurysm. 47 mm  Repeat CTA in one year.  Candee Furbish, MD      Left message of stable results and to repeat in 1 year.  Advised results are on MyChart for his review or to c/b with any questions/concerns.

## 2021-11-25 ENCOUNTER — Telehealth: Payer: Self-pay

## 2021-11-25 NOTE — Telephone Encounter (Signed)
Patient wife called this am- patient has had painless hematuria for 2 days and passing clot at times.  Appt given to patient to see MD 12/13 at [place]  Wife voiced understanding.

## 2021-12-06 ENCOUNTER — Ambulatory Visit (HOSPITAL_COMMUNITY): Payer: Medicare Other | Attending: Orthopedic Surgery | Admitting: Physical Therapy

## 2021-12-06 ENCOUNTER — Other Ambulatory Visit: Payer: Self-pay

## 2021-12-06 ENCOUNTER — Encounter: Payer: Self-pay | Admitting: Urology

## 2021-12-06 ENCOUNTER — Encounter (HOSPITAL_COMMUNITY): Payer: Self-pay | Admitting: Physical Therapy

## 2021-12-06 ENCOUNTER — Ambulatory Visit: Payer: Medicare Other | Admitting: Urology

## 2021-12-06 VITALS — BP 153/76 | HR 78

## 2021-12-06 DIAGNOSIS — C678 Malignant neoplasm of overlapping sites of bladder: Secondary | ICD-10-CM

## 2021-12-06 DIAGNOSIS — M79605 Pain in left leg: Secondary | ICD-10-CM | POA: Diagnosis present

## 2021-12-06 DIAGNOSIS — M25552 Pain in left hip: Secondary | ICD-10-CM | POA: Diagnosis present

## 2021-12-06 DIAGNOSIS — M6281 Muscle weakness (generalized): Secondary | ICD-10-CM

## 2021-12-06 DIAGNOSIS — R3129 Other microscopic hematuria: Secondary | ICD-10-CM

## 2021-12-06 DIAGNOSIS — N200 Calculus of kidney: Secondary | ICD-10-CM | POA: Diagnosis not present

## 2021-12-06 DIAGNOSIS — R262 Difficulty in walking, not elsewhere classified: Secondary | ICD-10-CM | POA: Insufficient documentation

## 2021-12-06 LAB — URINALYSIS, ROUTINE W REFLEX MICROSCOPIC
Bilirubin, UA: NEGATIVE
Glucose, UA: NEGATIVE
Ketones, UA: NEGATIVE
Leukocytes,UA: NEGATIVE
Nitrite, UA: NEGATIVE
Protein,UA: NEGATIVE
RBC, UA: NEGATIVE
Specific Gravity, UA: 1.025 (ref 1.005–1.030)
Urobilinogen, Ur: 0.2 mg/dL (ref 0.2–1.0)
pH, UA: 5 (ref 5.0–7.5)

## 2021-12-06 MED ORDER — CIPROFLOXACIN HCL 500 MG PO TABS
500.0000 mg | ORAL_TABLET | Freq: Once | ORAL | Status: AC
  Administered 2021-12-06: 500 mg via ORAL

## 2021-12-06 NOTE — Patient Instructions (Signed)
Access Code: [email or web address] URL: [email or web address] Date: 12/06/2021 Prepared by: Yetta Glassman  Exercises Clamshell - 4 sets - 5 reps - 5 hold Standing Tandem Balance with Counter Support - 3 reps - 30 hold Narrow Stance with Head Nods and Counter Support - 3 reps - 30 hold

## 2021-12-06 NOTE — Progress Notes (Signed)
Urological Symptom Review  Patient is experiencing the following symptoms: Blood in urine   Review of Systems  Gastrointestinal (upper)  : Indigestion/heartburn  Gastrointestinal (lower) : Negative for lower GI symptoms  Constitutional : Negative for symptoms  Skin: Negative for skin symptoms  Eyes: Negative for eye symptoms  Ear/Nose/Throat : Negative for Ear/Nose/Throat symptoms  Hematologic/Lymphatic: Negative for Hematologic/Lymphatic symptoms  Cardiovascular : Leg swelling  Respiratory : Shortness of breath  Endocrine: Negative for endocrine symptoms  Musculoskeletal: Back pain Joint pain  Neurological: Headaches Dizziness  Psychologic: Depression Anxiety

## 2021-12-06 NOTE — Therapy (Signed)
[place], [place], [address] Phone: [phone]   Fax:  [phone]  Physical Therapy Evaluation  Patient Details  Name: Kyle Owen MRN: [id number] Date of Birth: Jan 01, 1941 Referring Provider (PT): Fredonia Highland, MD   Encounter Date: 12/06/2021   PT End of Session - 12/06/21 1311     Visit Number 1    Number of Visits 1    Date for PT Re-Evaluation 12/20/21    Authorization Type UHC MCR PPO no auth follow MCR guidelines    Progress Note Due on Visit 10    PT Start Time 1315    PT Stop Time 1405    PT Time Calculation (min) 50 min    Activity Tolerance Patient limited by lethargy             Past Medical History:  Diagnosis Date   Arthritis    DJD right knee   Benign prostatic hypertrophy with urinary frequency    Bladder cancer ([facility])    Complication of anesthesia    gets combative in recovery   COPD with emphysema ([place])    Depression    Dyspnea    when walking   Dysrhythmia    a fib   History of colon polyps    History of kidney stones    History of loop recorder    loop recorder in place battery is dead has not had changed due to covid   History of pneumonia    hx Recurrent -- last bout Dec 2016 CAP-- per pt resolved   History of TIA (transient ischemic attack) no residual per pt   per neurologist note (dr Leta Baptist 11-08-2015) dx cryptogenic TIA versus arrhythia versus dysautonomia (right ophthalmic artery TIA and x3 posterior circulation TIAs   Hyperlipidemia    Hypertension    Multinodular thyroid    per pathology report 11-12-2014 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   Pulmonary nodule, left    left lower  lobe x2 per CT 01-20-2016   Renal cyst, left    Stroke [facility])    TIA's   Thoracic aortic aneurysm without rupture    ascending -- ct chest 01/10/16  4.8cm   Wears hearing aid    bilateral-- wear at times    Past Surgical History:  Procedure Laterality Date   ANTERIOR CERVICAL DECOMP/DISCECTOMY FUSION N/A 04/03/2017   Procedure: Cervical three-four, Cervical four-five Anterior cervical decompression/discectomy/fusion;  Surgeon: Erline Levine, MD;  Location: [facility];  Service: Neurosurgery;  Laterality: N/A;   CATARACT EXTRACTION W/ INTRAOCULAR LENS IMPLANT Right 2016   CATARACT EXTRACTION W/PHACO  12/16/2012   Procedure: CATARACT EXTRACTION PHACO AND INTRAOCULAR LENS PLACEMENT ([place]);  Surgeon: Tonny Branch, MD;  [facility];  Service: Ophthalmology;  Laterality: Left;  CDE:17.31   COLONOSCOPY  10/03/2011   Procedure: COLONOSCOPY;  Surgeon: Jamesetta So;  [facility];  Service: Gastroenterology;  Laterality: N/A;   CYSTOSCOPY/RETROGRADE/URETEROSCOPY/STONE EXTRACTION WITH BASKET Right 03/29/2020   Procedure: CYSTOSCOPY/RETROGRADE/URETEROSCOPY/STONE EXTRACTION WITH BASKET;  Surgeon: Franchot Gallo, MD;  [facility];  Service: Urology;  Laterality: Right;  1 HR   DECOMPRESSIOIN ULNAR NERVE AND CUBITAL TUNNEL RELEASE Left  07-14-2009   elbow   EP IMPLANTABLE DEVICE N/A 08/10/2015   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   HOLMIUM LASER APPLICATION Right 08/02/3809   Procedure: HOLMIUM LASER APPLICATION;  Surgeon: Franchot Gallo, MD;  [facility];  Service: Urology;  Laterality: Right;   INGUINAL HERNIA REPAIR Right 1990's   KNEE ARTHROSCOPY Right 2015   LEFT CUBITAL TUNNEL RELEASE     POSTERIOR LUMBAR FUSION  2014   X2   SHOULDER ARTHROSCOPY Left 1990's   TEE WITHOUT CARDIOVERSION N/A 07/27/2015   Procedure: TRANSESOPHAGEAL ECHOCARDIOGRAM (TEE);  Surgeon: Lelon Perla, MD;  Location: [facility] ENDOSCOPY;  Service: Cardiovascular;  Laterality: N/A;   normal  LV function, ef 55-60%,  mild AR and MR, mild dilated ascending aorta (4.2cm),  mild to moderate atherosclerosis  descending aorta,  mild to moderate TR,  negative saline microcavitation study   THYROIDECTOMY Right 01/20/2015   Procedure: RIGHT THYROIDECTOMY;  Surgeon: Ascencion Dike, MD;  Location: [facility] OR;  Service: ENT;  Laterality: Right;   TONSILLECTOMY  as child   TOTAL HIP ARTHROPLASTY Left 12/14/2020   Procedure: TOTAL HIP ARTHROPLASTY ANTERIOR APPROACH;  Surgeon: [name], MD;  [facility];  Service: Orthopedics;  Laterality: Left;   TRANSURETHRAL RESECTION OF BLADDER TUMOR N/A 05/15/2016   Procedure: TRANSURETHRAL RESECTION OF BLADDER TUMOR (TURBT) AND INSTILLATION OF EPIRUBICIN;  Surgeon: Franchot Gallo, MD;  Location: [facility];  Service: Urology;  Laterality: N/A;   TRANSURETHRAL RESECTION OF BLADDER TUMOR N/A 03/29/2020   Procedure: TRANSURETHRAL RESECTION OF BLADDER TUMOR (TURBT);  Surgeon: Franchot Gallo, MD;  [facility];  Service: Urology;  Laterality: N/A;    There were no vitals filed for this visit.    Subjective Assessment - 12/06/21 1342     Subjective States he still has pain in his left leg. States he is walking a little better. States that he goes to the gym 2x/week and uses the equipment there. States he does a bit of walking outside. States that he still walks side ways at home. States he had a recent MD visit and his THA looks good and that it is nothing they did that causing his current pain. States that he does have back pain and then he has to lean forward to alleviate his symptoms. Reports he has a history of lumbar fusion. States that he has some growths and is getting a CT scan on his prostate and bladder. States that he also has a history of neck pain and history of cervical fusion C3-5 and follows up with a neurosurgeon tomorrow concerning his neck pain tomorrow. Patient reports he gets short of breath with walking and is  currently on O2 at night.    Pertinent History left THA on 12/14/20, Lx fusion, cervical fusion, neck pain, growths on bladder/prostate    Patient Stated Goals to have better balance    Currently in Pain? Yes    Pain Score 3     Pain Location Back    Pain Orientation Lower    Pain Descriptors / Indicators Aching    Pain Type Chronic pain                [facility] PT Assessment - 12/06/21 0001       Assessment   Medical Diagnosis L THA    Referring Provider (PT) Fredonia Highland, MD    Next MD Visit 12/2021    Prior Therapy yes for left hip after left THA  12/14/20      Balance Screen   Has the patient fallen in the past 6 months No      [place] residence    Living Arrangements Spouse/significant other    Available Help at Discharge Family    Type of [place]      Prior Function   Level of [place] with community mobility with device      Cognition   Overall Cognitive Status Within Functional Limits for tasks assessed      ROM / Strength   AROM / PROM / Strength Strength      Strength   Strength Assessment Site Hip;Knee;Ankle    Right/Left Hip Right;Left    Right Hip Flexion 4+/5   cramp   Right Hip ABduction 4+/5    Left Hip Flexion 4+/5    Left Hip ABduction 4-/5    Right/Left Knee Right;Left    Right Knee Flexion 4/5    Right Knee Extension 4+/5    Left Knee Flexion 4/5    Left Knee Extension 4/5   cramping     Ambulation/Gait   Ambulation/Gait Yes    Ambulation/Gait Assistance 5: Supervision    Ambulation Distance (Feet) 334 Feet    Assistive device Straight cane    Gait Pattern Step-through pattern;Decreased arm swing - left;Decreased arm swing - right;Left foot flat;Right foot flat;Decreased dorsiflexion - right;Decreased dorsiflexion - left;Decreased hip/knee flexion - left;Decreased hip/knee flexion - right   shuffles feet   Ambulation Surface Level;Indoor    Gait velocity decreased    Gait Comments 2MW       Balance   Balance Assessed Yes      Static Standing Balance   Static Standing - Comment/# of Minutes Tandem 25 seconds left posterior and 30 seconds right posterior                        Objective measurements completed on examination: See above findings.       [place] Adult PT Treatment/Exercise - 12/06/21 0001       Exercises   Exercises Knee/Hip      Knee/Hip Exercises: Standing   Other Standing Knee Exercises narrow BOS with trunk turns x10 B    Other Standing Knee Exercises tande x3 30" holds B      Knee/Hip Exercises: Sidelying   Clams x5 5" holds B                     PT Education - 12/06/21 1415     Education Details on current presentation, on importance of working on balance and hip specific strengthening exercises, on focusing on other health issues at this time.    Person(s) Educated Patient;Spouse    Methods Explanation    Comprehension Verbalized understanding              PT Short Term Goals - 12/06/21 1416       PT SHORT TERM GOAL #1   Title Patient will report importance of daily exercises which include balance to improve overall balance    Time 2    Period Weeks    Status New    Target Date 12/20/21                       Plan - 12/06/21 1417     Clinical Impression Statement Patient is known to this clinic for previous treatment of  left hip pain after left hip replacement last December. Patient continues to have hip pain and balance issues. Reviewed previous HEP and patient is going to the gym 2x/week, but is not performing daily exercises or balance exercises at this time. Educated patient on importance of challenging his balance and increasing overall frequency of exercises to continue to expect improvements in overall strength and balance. Discussed current patient presentation near baseline and how he has multiple health issues at this time that should be prioritized. Patient for one time visit at  this time focusing on HEP and encouraged patient to return to therapy if HEP does not help once he has other medical issues resolved.    Personal Factors and Comorbidities Comorbidity 1;Comorbidity 2;Comorbidity 3+;Fitness    Comorbidities neck and back fusions, left THA last december    Examination-Activity Limitations Stand;Locomotion Level    Examination-Participation Restrictions Yard Work;Community Activity    Stability/Clinical Decision Making Evolving/Moderate complexity    Clinical Decision Making Moderate    Rehab Potential Fair    PT Frequency One time visit    PT Duration 2 weeks    PT Treatment/Interventions ADLs/Self Care Home Management;Therapeutic exercise;Patient/family education    PT Next Visit Plan one time visit    PT Home Exercise Plan clamshells, tandem, narrow base of support with body turns    Consulted and Agree with Plan of Care Patient;Family member/caregiver    Family Member Consulted wife             Patient will benefit from skilled therapeutic intervention in order to improve the following deficits and impairments:  Pain, Decreased strength, Decreased balance, Decreased activity tolerance, Difficulty walking  Visit Diagnosis: Difficulty in walking, not elsewhere classified  Pain in left hip  Muscle weakness (generalized)  Pain in left leg     Problem List Patient Active Problem List   Diagnosis Date Noted   Chronic anticoagulation 10/06/2021   History of stroke 10/06/2021   Bladder cancer ([place]) 10/06/2021   Morning headache 04/04/2021   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

## 2021-12-06 NOTE — Progress Notes (Signed)
History of Present Illness: 4.5.20221-Underwent TURBT/Rt URS/stone mgmt.   Initial TURBT of a 2 cm papillary bladder tumor on 5.22.2017.  Pathology revealed papillary low-grade NMIBC. Epirubicin was placed post resection.   4.5.2021: He underwent cystoscopy, biopsy of papillary lesions at the right ureteral orifice (path--urothelial papilloma), right ureteroscopy with holmium laser and extraction of stone as well as stent placement.  He has since remove the stent.  He has passed multiple small fragments.  7.12.2022: Cystoscopy negative  12.13.2022: Here early for follow-up.  Had 3 to 4 days of total gross painless hematuria which cleared up about a week ago.  No change in urinary pattern, no flank or abdominal symptoms at that time.    Past Medical History:  Diagnosis Date   Arthritis    DJD right knee   Benign prostatic hypertrophy with urinary frequency    Bladder cancer ([facility])    Complication of anesthesia    gets combative in recovery   COPD with emphysema ([facility])    Depression    Dyspnea    when walking   Dysrhythmia    a fib   History of colon polyps    History of kidney stones    History of loop recorder    loop recorder in place battery is dead has not had changed due to covid   History of pneumonia    hx Recurrent -- last bout Dec 2016 CAP-- per pt resolved   History of TIA (transient ischemic attack) no residual per pt   per neurologist note (dr Leta Baptist 11-08-2015) dx cryptogenic TIA versus arrhythia versus dysautonomia (right ophthalmic artery TIA and x3 posterior circulation TIAs   Hyperlipidemia    Hypertension    Multinodular thyroid    per pathology report 11-12-2014 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   Pulmonary nodule, left    left lower lobe x2 per CT 01-20-2016   Renal cyst, left    Stroke [facility])    TIA's   Thoracic aortic aneurysm without rupture    ascending -- ct chest 01/10/16  4.8cm   Wears hearing aid    bilateral-- wear at times    Past Surgical History:  Procedure Laterality Date   ANTERIOR CERVICAL DECOMP/DISCECTOMY FUSION N/A 04/03/2017   Procedure: Cervical three-four, Cervical four-five Anterior cervical decompression/discectomy/fusion;  Surgeon: Erline Levine, MD;  Location: [facility];  Service: Neurosurgery;  Laterality: N/A;   CATARACT EXTRACTION W/ INTRAOCULAR LENS IMPLANT Right 2016   CATARACT EXTRACTION W/PHACO  12/16/2012   Procedure: CATARACT EXTRACTION PHACO AND INTRAOCULAR LENS PLACEMENT ([place]);  Surgeon: Tonny Branch, MD;  [facility];  Service: Ophthalmology;  Laterality: Left;  CDE:17.31   COLONOSCOPY  10/03/2011   Procedure: COLONOSCOPY;  Surgeon: Jamesetta So;  [facility];  Service: Gastroenterology;  Laterality: N/A;   CYSTOSCOPY/RETROGRADE/URETEROSCOPY/STONE EXTRACTION WITH BASKET Right 03/29/2020   Procedure: CYSTOSCOPY/RETROGRADE/URETEROSCOPY/STONE EXTRACTION WITH BASKET;  Surgeon: Franchot Gallo, MD;  [facility];  Service: Urology;  Laterality: Right;  1 HR   DECOMPRESSIOIN ULNAR NERVE AND CUBITAL TUNNEL RELEASE Left 07-14-2009   elbow   EP IMPLANTABLE DEVICE N/A 08/10/2015   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   HOLMIUM  LASER APPLICATION Right 07/29/1659   Procedure: HOLMIUM LASER APPLICATION;  Surgeon: Franchot Gallo, MD;  [facility];  Service: Urology;  Laterality: Right;   INGUINAL HERNIA REPAIR Right 1990's   KNEE ARTHROSCOPY Right 2015   LEFT CUBITAL TUNNEL RELEASE     POSTERIOR LUMBAR FUSION  2014   X2   SHOULDER ARTHROSCOPY Left 1990's   TEE WITHOUT CARDIOVERSION N/A 07/27/2015   Procedure: TRANSESOPHAGEAL ECHOCARDIOGRAM (TEE);  Surgeon: Lelon Perla, MD;  Location: [facility]  ENDOSCOPY;  Service: Cardiovascular;  Laterality: N/A;   normal LV function, ef 55-60%,  mild AR and MR, mild dilated ascending aorta (4.2cm),  mild to moderate atherosclerosis  descending aorta,  mild to moderate TR,  negative saline microcavitation study   THYROIDECTOMY Right 01/20/2015   Procedure: RIGHT THYROIDECTOMY;  Surgeon: Ascencion Dike, MD;  [facility] OR;  Service: ENT;  Laterality: Right;   TONSILLECTOMY  as child   TOTAL HIP ARTHROPLASTY Left 12/14/2020   Procedure: TOTAL HIP ARTHROPLASTY ANTERIOR APPROACH;  Surgeon: [name], MD;  [facility];  Service: Orthopedics;  Laterality: Left;   TRANSURETHRAL RESECTION OF BLADDER TUMOR N/A 05/15/2016   Procedure: TRANSURETHRAL RESECTION OF BLADDER TUMOR (TURBT) AND INSTILLATION OF EPIRUBICIN;  Surgeon: Franchot Gallo, MD;  Location: [facility];  Service: Urology;  Laterality: N/A;   TRANSURETHRAL RESECTION OF BLADDER TUMOR N/A 03/29/2020   Procedure: TRANSURETHRAL RESECTION OF BLADDER TUMOR (TURBT);  Surgeon: Franchot Gallo, MD;  [facility];  Service: Urology;  Laterality: N/A;    Home Medications:  Allergies as of 12/06/2021       Reactions   Flomax [tamsulosin Hcl] Nausea And Vomiting, Other (See Comments)   "Pt thought he was dying " DIZZY   Fentanyl    Makes me combative         Medication List        Accurate as of December 06, 2021  8:20 AM. If you have any questions, ask your nurse or doctor.          acetaminophen 500 MG tablet Commonly known as: TYLENOL Take 2 tablets (1,000 mg total) by mouth every 6 (six) hours.   atorvastatin 20 MG tablet Commonly known as: LIPITOR TAKE 1 TABLET BY MOUTH DAILY.   Breztri Aerosphere 160-9-4.8 MCG/ACT Aero Generic drug: Budeson-Glycopyrrol-Formoterol Inhale 2 puffs into the lungs in the morning and at bedtime.   Eliquis 5 MG Tabs tablet Generic drug: apixaban TAKE (1) TABLET BY MOUTH TWICE DAILY.   finasteride 5 MG  tablet Commonly known as: PROSCAR Take 5 mg by mouth daily.   loratadine 10 MG tablet Commonly known as: CLARITIN Take 10 mg by mouth daily as needed for allergies.   Magnesium 250 MG Tabs Take 500 mg by mouth daily.   OVER THE COUNTER MEDICATION Apply 1 application topically daily as needed (Joint pain). CBD cream   sertraline 50 MG tablet Commonly known as: ZOLOFT Take 50 mg by mouth every morning.   Ventolin HFA 108 (90 Base) MCG/ACT inhaler Generic drug: albuterol Inhale 2 puffs into the lungs Every 4 hours as needed for wheezing or shortness of breath.   verapamil 240 MG CR tablet Commonly known as: CALAN-SR Take 240 mg by mouth daily.        Allergies:  Allergies  Allergen Reactions   Flomax [Tamsulosin Hcl] Nausea And Vomiting and Other (See Comments)    "Pt thought he was dying " DIZZY   Fentanyl     Makes  me combative     Family History  Problem Relation Age of Onset   Stroke Sister    Breast cancer Sister    Stroke Brother     Social History:  reports that he quit smoking about 11 years ago. His smoking use included cigarettes. He has a 45.00 pack-year smoking history. He has never used smokeless tobacco. He reports that he does not drink alcohol and does not use drugs.  ROS: A complete review of systems was performed.  All systems are negative except for pertinent findings as noted.  Physical Exam:  Vital signs in last 24 hours: There were no vitals taken for this visit. Constitutional:  Alert and oriented, No acute distress Cardiovascular: Regular rate  Respiratory: Normal respiratory effort Neurologic: Grossly intact, no focal deficits Psychiatric: Normal mood and affect  I have reviewed prior pt notes  I have reviewed notes from referring/previous physicians  I have reviewed urinalysis results  I have independently reviewed prior imaging--prior CT scan  Cystoscopy Procedure Note:  Indication: History of bladder cancer, recent gross  hematuria  After informed consent and discussion of the procedure and its risks, Kyle Owen was positioned and prepped in the standard fashion.  Cystoscopy was performed with a flexible cystoscope.   Findings: Urethra: No stricture Prostate: No obstruction.  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      Impression/Assessment:  1.  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  Gross hematuria, most likely explained by above  Plan:  1.  I will check BUN and creatinine today in advance of CT abdomen/pelvis, hematuria protocol  2.  Following that, we will schedule TUR of urethral lesions

## 2021-12-07 ENCOUNTER — Other Ambulatory Visit (HOSPITAL_COMMUNITY): Payer: Self-pay | Admitting: Neurosurgery

## 2021-12-07 ENCOUNTER — Other Ambulatory Visit: Payer: Self-pay | Admitting: Neurosurgery

## 2021-12-07 DIAGNOSIS — M542 Cervicalgia: Secondary | ICD-10-CM

## 2021-12-07 LAB — BUN+CREAT
BUN/Creatinine Ratio: 17 (ref 10–24)
BUN: 26 mg/dL (ref 8–27)
Creatinine, Ser: 1.53 mg/dL — ABNORMAL HIGH (ref 0.76–1.27)
eGFR: 45 mL/min/{1.73_m2} — ABNORMAL LOW (ref >59)

## 2021-12-12 ENCOUNTER — Ambulatory Visit (HOSPITAL_COMMUNITY): Payer: Medicare Other | Admitting: Physical Therapy

## 2021-12-15 ENCOUNTER — Ambulatory Visit (HOSPITAL_COMMUNITY)
Admission: RE | Admit: 2021-12-15 | Discharge: 2021-12-15 | Disposition: A | Discharge status: Home / Self Care | Payer: Medicare Other | Source: Ambulatory Visit | Attending: Urology | Admitting: Urology

## 2021-12-15 ENCOUNTER — Other Ambulatory Visit: Payer: Self-pay

## 2021-12-15 DIAGNOSIS — C678 Malignant neoplasm of overlapping sites of bladder: Secondary | ICD-10-CM | POA: Diagnosis not present

## 2021-12-15 MED ORDER — IOHEXOL 300 MG/ML  SOLN
100.0000 mL | Freq: Once | INTRAMUSCULAR | Status: AC | PRN
  Administered 2021-12-15: 14:00:00 100 mL via INTRAVENOUS

## 2021-12-20 ENCOUNTER — Telehealth: Payer: Self-pay

## 2021-12-20 NOTE — Telephone Encounter (Signed)
-----   Message from Franchot Gallo, MD sent at 12/20/2021 11:48 AM EST ----- Let patient know that CT scan looks fine.  We will be in touch from our [place] office about scheduling his repeat procedure for the bladder cancer. ----- Message ----- From: Dorisann Frames, RN Sent: 12/19/2021   9:23 AM EST To: Franchot Gallo, MD  Please review

## 2021-12-20 NOTE — Telephone Encounter (Signed)
Patient called and notified of results.  Voiced understanding. 

## 2021-12-20 NOTE — Telephone Encounter (Signed)
Calling to let Dr. Diona Fanti know.   Imaging has been done.   Dr.Dahlstedt can take a look at it.  Thanks, Helene Kelp

## 2021-12-21 ENCOUNTER — Ambulatory Visit (HOSPITAL_COMMUNITY)
Admission: RE | Admit: 2021-12-21 | Discharge: 2021-12-21 | Disposition: A | Discharge status: Home / Self Care | Payer: Medicare Other | Source: Ambulatory Visit | Attending: Neurosurgery | Admitting: Neurosurgery

## 2021-12-21 ENCOUNTER — Other Ambulatory Visit: Payer: Self-pay

## 2021-12-21 DIAGNOSIS — M542 Cervicalgia: Secondary | ICD-10-CM | POA: Insufficient documentation

## 2021-12-28 ENCOUNTER — Other Ambulatory Visit (HOSPITAL_COMMUNITY): Payer: Self-pay | Admitting: Orthopedic Surgery

## 2021-12-28 DIAGNOSIS — M25552 Pain in left hip: Secondary | ICD-10-CM

## 2021-12-30 ENCOUNTER — Other Ambulatory Visit: Payer: Self-pay | Admitting: Urology

## 2022-01-02 ENCOUNTER — Encounter (HOSPITAL_COMMUNITY)
Admission: RE | Admit: 2022-01-02 | Discharge: 2022-01-02 | Disposition: A | Discharge status: Home / Self Care | Payer: Medicare Other | Source: Ambulatory Visit | Attending: Orthopedic Surgery | Admitting: Orthopedic Surgery

## 2022-01-02 ENCOUNTER — Other Ambulatory Visit: Payer: Self-pay

## 2022-01-02 DIAGNOSIS — M25552 Pain in left hip: Secondary | ICD-10-CM | POA: Insufficient documentation

## 2022-01-02 MED ORDER — TECHNETIUM TC 99M MEDRONATE IV KIT
20.0000 | PACK | Freq: Once | INTRAVENOUS | Status: AC | PRN
  Administered 2022-01-02: 22 via INTRAVENOUS

## 2022-01-02 NOTE — Progress Notes (Signed)
Sent message, via epic in basket, requesting orders in epic from surgeon.  

## 2022-01-11 NOTE — Patient Instructions (Addendum)
DUE TO COVID-19 ONLY ONE VISITOR IS ALLOWED TO COME WITH YOU AND STAY IN THE WAITING ROOM ONLY DURING PRE OP AND PROCEDURE.   **NO VISITORS ARE ALLOWED IN THE SHORT STAY AREA OR RECOVERY ROOM!!**       Your procedure is scheduled on: 01/23/22   Report to [facility] Main Entrance    Report to admitting at 5:45 AM   Call this number if you have problems the morning of surgery [phone]   Do not eat food :After Midnight.   May have liquids until 5:00 AM day of surgery  CLEAR LIQUID DIET  Foods Allowed                                                                     Foods Excluded  Water, Black Coffee and tea (no milk or creamer)            liquids that you cannot  Plain Jell-O in any flavor  (No red)                                     see through such as: Fruit ices (not with fruit pulp)                                             milk, soups, orange juice              Iced Popsicles (No red)                                                All solid food                                   Apple juices Sports drinks like Gatorade (No red) Lightly seasoned clear broth or consume(fat free) Sugar   Oral Hygiene is also important to reduce your risk of infection.                                    Remember - BRUSH YOUR TEETH THE MORNING OF SURGERY WITH YOUR REGULAR TOOTHPASTE   Stop all vitamins and supplements 7 days before surgery.   Take these medicines the morning of surgery with A SIP OF WATER: Tylenol, Lipitor, Finasteride, Claritin, Zoloft, Inhaler                              You may not have any metal on your body including jewelry, and body piercing             Do not wear lotions, powders, cologne, or deodorant              Men may shave face and neck.   Do  not bring valuables to the hospital. [place].    Patients discharged on the day of surgery will not be allowed to drive home.  Someone needs to stay  with you for the first 24 hours after anesthesia.   Special Instructions: Bring a copy of your healthcare power of attorney and living will documents         the day of surgery if you haven't scanned them before.              Please read over the following fact sheets you were given: IF YOU HAVE QUESTIONS ABOUT YOUR PRE-OP INSTRUCTIONS PLEASE CALL [place] - Preparing for Surgery Before surgery, you can play an important role.  Because skin is not sterile, your skin needs to be as free of germs as possible.  You can reduce the number of germs on your skin by washing with CHG (chlorahexidine gluconate) soap before surgery.  CHG is an antiseptic cleaner which kills germs and bonds with the skin to continue killing germs even after washing. Please DO NOT use if you have an allergy to CHG or antibacterial soaps.  If your skin becomes reddened/irritated stop using the CHG and inform your nurse when you arrive at Short Stay. Do not shave (including legs and underarms) for at least 48 hours prior to the first CHG shower.  You may shave your face/neck.  Please follow these instructions carefully:  1.  Shower with CHG Soap the night before surgery and the  morning of surgery.  2.  If you choose to wash your hair, wash your hair first as usual with your normal  shampoo.  3.  After you shampoo, rinse your hair and body thoroughly to remove the shampoo.                             4.  Use CHG as you would any other liquid soap.  You can apply chg directly to the skin and wash.  Gently with a scrungie or clean washcloth.  5.  Apply the CHG Soap to your body ONLY FROM THE NECK DOWN.   Do   not use on face/ open                           Wound or open sores. Avoid contact with eyes, ears mouth and   genitals (private parts).                       Wash face,  Genitals (private parts) with your normal soap.             6.  Wash thoroughly, paying special attention to the area where your     surgery  will be performed.  7.  Thoroughly rinse your body with warm water from the neck down.  8.  DO NOT shower/wash with your normal soap after using and rinsing off the CHG Soap.                9.  Pat yourself dry with a clean towel.            10.  Wear clean pajamas.            11.  Place clean  sheets on your bed the night of your first shower and do not  sleep with pets. Day of Surgery : Do not apply any lotions/deodorants the morning of surgery.  Please wear clean clothes to the hospital/surgery center.  FAILURE TO FOLLOW THESE INSTRUCTIONS MAY RESULT IN THE CANCELLATION OF YOUR SURGERY  PATIENT SIGNATURE_________________________________  NURSE SIGNATURE__________________________________  ________________________________________________________________________

## 2022-01-11 NOTE — Progress Notes (Addendum)
COVID swab appointment: n/a  COVID Vaccine Completed: yes x2 Date COVID Vaccine completed: 01/29/20, 02/27/20 Has received booster: COVID vaccine manufacturer: Moderna    Date of COVID positive in last 90 days: no  PCP - Asencion Noble, MD Cardiologist - Candee Furbish, MD  Chest x-ray - CT 10/20/21 Epic EKG - 10/06/21 Epic Stress Test - 2016 ECHO - 2016 Cardiac Cath - n/a Pacemaker/ICD device last checked: n/a Spinal Cord Stimulator: n/a  Sleep Study - n/a CPAP -   Fasting Blood Sugar - n/a Checks Blood Sugar _____ times a day  Blood Thinner Instructions: Eliquis, hold 2 days Aspirin Instructions: Last Dose:  Activity level: Can go up a flight of stairs and perform activities of daily living without symptoms of chest pain. Has to stop with yard work due to tiredness and SOB      Anesthesia review: A fib, HTN, COPD, TIA, aortic aneurysm, O2 at hs  Patient denies shortness of breath, fever, cough and chest pain at PAT appointment   Patient verbalized understanding of instructions that were given to them at the PAT appointment. Patient was also instructed that they will need to review over the PAT instructions again at home before surgery.

## 2022-01-12 ENCOUNTER — Encounter (HOSPITAL_COMMUNITY): Payer: Self-pay

## 2022-01-12 ENCOUNTER — Encounter (HOSPITAL_COMMUNITY)
Admission: RE | Admit: 2022-01-12 | Discharge: 2022-01-12 | Disposition: A | Discharge status: Home / Self Care | Payer: Medicare Other | Source: Ambulatory Visit | Attending: Urology | Admitting: Urology

## 2022-01-12 ENCOUNTER — Other Ambulatory Visit: Payer: Self-pay

## 2022-01-12 VITALS — BP 135/81 | HR 64 | Temp 98.2°F | Resp 18 | Ht 73.0 in | Wt 218.7 lb

## 2022-01-12 DIAGNOSIS — I480 Paroxysmal atrial fibrillation: Secondary | ICD-10-CM | POA: Diagnosis not present

## 2022-01-12 DIAGNOSIS — Z87891 Personal history of nicotine dependence: Secondary | ICD-10-CM | POA: Diagnosis not present

## 2022-01-12 DIAGNOSIS — I712 Thoracic aortic aneurysm, without rupture, unspecified: Secondary | ICD-10-CM | POA: Insufficient documentation

## 2022-01-12 DIAGNOSIS — C679 Malignant neoplasm of bladder, unspecified: Secondary | ICD-10-CM | POA: Diagnosis not present

## 2022-01-12 DIAGNOSIS — I10 Essential (primary) hypertension: Secondary | ICD-10-CM | POA: Diagnosis not present

## 2022-01-12 DIAGNOSIS — Z7901 Long term (current) use of anticoagulants: Secondary | ICD-10-CM | POA: Insufficient documentation

## 2022-01-12 DIAGNOSIS — N400 Enlarged prostate without lower urinary tract symptoms: Secondary | ICD-10-CM | POA: Diagnosis not present

## 2022-01-12 DIAGNOSIS — Z01812 Encounter for preprocedural laboratory examination: Secondary | ICD-10-CM | POA: Insufficient documentation

## 2022-01-12 DIAGNOSIS — J439 Emphysema, unspecified: Secondary | ICD-10-CM | POA: Diagnosis not present

## 2022-01-12 DIAGNOSIS — Z8673 Personal history of transient ischemic attack (TIA), and cerebral infarction without residual deficits: Secondary | ICD-10-CM | POA: Insufficient documentation

## 2022-01-12 DIAGNOSIS — Z8551 Personal history of malignant neoplasm of bladder: Secondary | ICD-10-CM | POA: Diagnosis not present

## 2022-01-12 LAB — CBC
HCT: 45.8 % (ref 39.0–52.0)
Hemoglobin: 14.4 g/dL (ref 13.0–17.0)
MCH: 31.7 pg (ref 26.0–34.0)
MCHC: 31.4 g/dL (ref 30.0–36.0)
MCV: 100.9 fL — ABNORMAL HIGH (ref 80.0–100.0)
Platelets: 171 10*3/uL (ref 150–400)
RBC: 4.54 MIL/uL (ref 4.22–5.81)
RDW: 13.2 % (ref 11.5–15.5)
WBC: 4.8 10*3/uL (ref 4.0–10.5)
nRBC: 0 % (ref 0.0–0.2)

## 2022-01-12 LAB — BASIC METABOLIC PANEL
Anion gap: 6 (ref 5–15)
BUN: 28 mg/dL — ABNORMAL HIGH (ref 8–23)
CO2: 28 mmol/L (ref 22–32)
Calcium: 8.9 mg/dL (ref 8.9–10.3)
Chloride: 105 mmol/L (ref 98–111)
Creatinine, Ser: 1.43 mg/dL — ABNORMAL HIGH (ref 0.61–1.24)
GFR, Estimated: 49 mL/min — ABNORMAL LOW (ref >60)
Glucose, Bld: 96 mg/dL (ref 70–99)
Potassium: 4.4 mmol/L (ref 3.5–5.1)
Sodium: 139 mmol/L (ref 135–145)

## 2022-01-13 NOTE — Progress Notes (Signed)
Anesthesia Chart Review   Case: [id number] Date/Time: 01/23/22 0745   Procedure: TRANSURETHRAL RESECTION OF BLADDER TUMOR WITH GEMCITABINE   Anesthesia type: General   Pre-op diagnosis: recurrent bladder cancer   Location: [place] / [facility]   Surgeons: Franchot Gallo, MD       DISCUSSION:80 y.o. former smoker with h/o HTN, COPD, PAF (on Eliquis), TIA, thoracic aortic aneurysm, BPH, recurrent bladder cancer scheduled for above procedure 01/23/2022 with Dr. Franchot Gallo.   Pt last seen by cardiology 10/06/2021.  Stable at this visit.   4.7cm thoracic aortic aneurysm stable on CT 10/20/2021.  Pt advised to hold Eliquis 2 days prior to surgery.   Anticipate pt can proceed with planned procedure barring acute status change.   VS: BP 135/81    Pulse 64    Temp 36.8 C (Oral)    Resp 18    Ht 6\' 1"  (1.854 m)    Wt 99.2 kg    SpO2 91%    BMI 28.85 kg/m   PROVIDERS: Asencion Noble, MD is PCP   Candee Furbish, MD is Cardiologist  LABS: Labs reviewed: Acceptable for surgery. (all labs ordered are listed, but only abnormal results are displayed)  Labs Reviewed  BASIC METABOLIC PANEL - Abnormal; Notable for the following components:      Result Value   BUN 28 (*)    Creatinine, Ser 1.43 (*)    GFR, Estimated 49 (*)    All other components within normal limits  CBC - Abnormal; Notable for the following components:   MCV 100.9 (*)    All other components within normal limits     IMAGES:   EKG: 10/06/2021 Rate 54 bpm  Sinus brady  CV: Myocardial Perfusion 06/08/2017 Nuclear stress EF: 66%. There was no ST segment deviation noted during stress. The study is normal. This is a low risk study. The left ventricular ejection fraction is hyperdynamic (>65%).   Thinning of the inferior wall at mid and basal level thought to be from extra cardiac /diaphragmatic attenuation No ischemia and no RWMA;s EF 66%   Echo 07/27/2015 - Left ventricle: Systolic function was normal.  The estimated    ejection fraction was in the range of 55% to 60%. Wall motion was    normal; there were no regional wall motion abnormalities.  - Aortic valve: No evidence of vegetation. There was mild    regurgitation.  - Ascending aorta: The ascending aorta was mildly dilated.  - Mitral valve: No evidence of vegetation. There was mild    regurgitation.  - Left atrium: No evidence of thrombus in the atrial cavity or    appendage.  - Right atrium: No evidence of thrombus in the atrial cavity or    appendage.  - Atrial septum: No defect or patent foramen ovale was identified.  - Tricuspid valve: No evidence of vegetation. There was    mild-moderate regurgitation.  - Pulmonic valve: No evidence of vegetation.  Past Medical History:  Diagnosis Date   Arthritis    DJD right knee   Benign prostatic hypertrophy with urinary frequency    Bladder cancer (HCC)    Complication of anesthesia    gets combative in recovery   COPD with emphysema (HCC)    Depression    Dyspnea    when walking   Dysrhythmia    a fib   History of colon polyps    History of kidney stones    History of loop recorder  loop recorder in place battery is dead has not had changed due to covid   History of pneumonia    hx Recurrent -- last bout Dec 2016 CAP-- per pt resolved   History of TIA (transient ischemic attack) no residual per pt   per neurologist note (dr Leta Baptist 11-08-2015) dx cryptogenic TIA versus arrhythia versus dysautonomia (right ophthalmic artery TIA and x3 posterior circulation TIAs   Hyperlipidemia    Hypertension    Multinodular thyroid    per pathology report 11-12-2014 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    Pulmonary nodule, left    left lower lobe x2 per CT 01-20-2016   Renal cyst, left    Stroke [facility])    TIA's   Thoracic aortic aneurysm without rupture    ascending -- ct chest 01/10/16  4.8cm   Wears hearing aid    bilateral-- wear at times    Past Surgical History:  Procedure Laterality Date   ANTERIOR CERVICAL DECOMP/DISCECTOMY FUSION N/A 04/03/2017   Procedure: Cervical three-four, Cervical four-five Anterior cervical decompression/discectomy/fusion;  Surgeon: Erline Levine, MD;  Location: [place];  Service: Neurosurgery;  Laterality: N/A;   CATARACT EXTRACTION W/ INTRAOCULAR LENS IMPLANT Right 2016   CATARACT EXTRACTION W/PHACO  12/16/2012   Procedure: CATARACT EXTRACTION PHACO AND INTRAOCULAR LENS PLACEMENT ([place]);  Surgeon: Tonny Branch, MD;  Location: [facility];  Service: Ophthalmology;  Laterality: Left;  CDE:17.31   COLONOSCOPY  10/03/2011   Procedure: COLONOSCOPY;  Surgeon: Jamesetta So;  [facility];  Service: Gastroenterology;  Laterality: N/A;   CYSTOSCOPY/RETROGRADE/URETEROSCOPY/STONE EXTRACTION WITH BASKET Right 03/29/2020   Procedure: CYSTOSCOPY/RETROGRADE/URETEROSCOPY/STONE EXTRACTION WITH BASKET;  Surgeon: Franchot Gallo, MD;  Location[facility];  Service: Urology;  Laterality: Right;  1 HR   DECOMPRESSIOIN ULNAR NERVE AND CUBITAL TUNNEL RELEASE Left 07-14-2009   elbow   EP IMPLANTABLE DEVICE N/A 08/10/2015   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   HOLMIUM LASER APPLICATION Right 05/01/997   Procedure: HOLMIUM LASER APPLICATION;  Surgeon: Franchot Gallo, MD;  [facility];  Service: Urology;  Laterality: Right;   INGUINAL HERNIA REPAIR Right 1990's   KNEE ARTHROSCOPY Right 2015   LEFT CUBITAL TUNNEL RELEASE     POSTERIOR LUMBAR FUSION  2014   X2   SHOULDER ARTHROSCOPY Left 1990's   TEE WITHOUT CARDIOVERSION N/A 07/27/2015   Procedure: TRANSESOPHAGEAL ECHOCARDIOGRAM (TEE);  Surgeon: Lelon Perla, MD;  Location: [facility] ENDOSCOPY;  Service:  Cardiovascular;  Laterality: N/A;   normal LV function, ef 55-60%,  mild AR and MR, mild dilated ascending aorta (4.2cm),  mild to moderate atherosclerosis  descending aorta,  mild to moderate TR,  negative saline microcavitation study   THYROIDECTOMY Right 01/20/2015   Procedure: RIGHT THYROIDECTOMY;  Surgeon: Ascencion Dike, MD;  Location: [facility] OR;  Service: ENT;  Laterality: Right;   TONSILLECTOMY  as child   TOTAL HIP ARTHROPLASTY Left 12/14/2020   Procedure: TOTAL HIP ARTHROPLASTY ANTERIOR APPROACH;  Surgeon: [name], MD;  [facility];  Service: Orthopedics;  Laterality: Left;   TRANSURETHRAL RESECTION OF BLADDER TUMOR N/A 05/15/2016   Procedure: TRANSURETHRAL RESECTION OF BLADDER TUMOR (TURBT) AND INSTILLATION OF EPIRUBICIN;  Surgeon:  Franchot Gallo, MD;  Location: [facility];  Service: Urology;  Laterality: N/A;   TRANSURETHRAL RESECTION OF BLADDER TUMOR N/A 03/29/2020   Procedure: TRANSURETHRAL RESECTION OF BLADDER TUMOR (TURBT);  Surgeon: Franchot Gallo, MD;  [facility];  Service: Urology;  Laterality: N/A;    MEDICATIONS:  acetaminophen (TYLENOL) 500 MG tablet   atorvastatin (LIPITOR) 20 MG tablet   Budeson-Glycopyrrol-Formoterol (BREZTRI AEROSPHERE) 160-9-4.8 MCG/ACT AERO   Cholecalciferol (VITAMIN D) 50 MCG (2000 UT) tablet   ELIQUIS 5 MG TABS tablet   finasteride (PROSCAR) 5 MG tablet   loratadine (CLARITIN) 10 MG tablet   Magnesium 500 MG TABS   sertraline (ZOLOFT) 50 MG tablet   VENTOLIN HFA 108 (90 BASE) MCG/ACT inhaler   verapamil (CALAN-SR) 240 MG CR tablet   No current facility-administered medications for this encounter.   Konrad Felix [place], PA-C [facility]Surgical Testing [phone]

## 2022-01-19 ENCOUNTER — Other Ambulatory Visit: Payer: Self-pay | Admitting: Pulmonary Disease

## 2022-01-21 NOTE — Anesthesia Preprocedure Evaluation (Addendum)
Anesthesia Evaluation  Patient identified by MRN, date of birth, ID band Patient awake    Reviewed: Allergy & Precautions, NPO status , [name] Chart, lab work & pertinent test results  History of Anesthesia Complications Negative for: history of anesthetic complications (sounds like emergence delirium with his ACDF in PACU)  Airway Mallampati: III  TM Distance: >3 FB Neck ROM: Limited   Comment: Very limited neck extension 2/2 ACDF  Dental  (+) Teeth Intact, Dental Advisory Given   Pulmonary shortness of breath and with exertion, COPD (oxygen qhs doesn't know how many liters),  oxygen dependent, former smoker,  Quit smoking 2011, 45 pack year history    Pulmonary exam normal breath sounds clear to auscultation       Cardiovascular hypertension (132/74 in preop), Pt. on medications Normal cardiovascular exam+ dysrhythmias Atrial Fibrillation + Valvular Problems/Murmurs (mild AI, mild MR, mild to mod TR) AI and MR  Rhythm:Regular Rate:Normal  Echo 2016: Normal LV function; mild AI and MR; mild to moderate TR; mildlydilated ascending aorta (4.2 cm); mild to moderateatherosclerosis descending aorta; negative saline microcavitation study.    Neuro/Psych  Headaches, PSYCHIATRIC DISORDERS Depression TIA   GI/Hepatic negative GI ROS, Neg liver ROS,   Endo/Other  negative endocrine ROS  Renal/GU Renal InsufficiencyRenal disease (Cr 1.43) Bladder dysfunction (hx bladder ca)      Musculoskeletal  (+) Arthritis , Osteoarthritis,    Abdominal   Peds  Hematology negative hematology ROS (+)   Anesthesia Other Findings   Reproductive/Obstetrics negative OB ROS                           Anesthesia Physical Anesthesia Plan  ASA: 4  Anesthesia Plan: General   Post-op Pain Management: Tylenol PO (pre-op)   Induction: Intravenous  PONV Risk Score and Plan: 3 and Ondansetron, Dexamethasone and  Treatment may vary due to age or medical condition  Airway Management Planned: LMA  Additional Equipment: None  Intra-op Plan:   Post-operative Plan: Extubation in OR  Informed Consent: I have reviewed the patients History and Physical, chart, labs and discussed the procedure including the risks, benefits and alternatives for the proposed anesthesia with the patient or authorized representative who has indicated his/her understanding and acceptance.     Dental advisory given  Plan Discussed with: CRNA  Anesthesia Plan Comments:       Anesthesia Quick Evaluation

## 2022-01-22 NOTE — H&P (Signed)
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  Initial TURBT of a 2 cm papillary bladder tumor on 5.22.2017.  Pathology revealed papillary low-grade NMIBC. Epirubicin was placed post resection.   4.5.2021: He underwent cystoscopy, biopsy of papillary lesions at the right ureteral orifice (path--urothelial papilloma), right ureteroscopy with holmium laser and extraction of stone as well as stent placement.  He has since remove the stent.  He has passed multiple small fragments.   7.12.2022: Cystoscopy negative  12.13.2022: Here early for follow-up.  Had 3 to 4 days of total gross painless hematuria which cleared up about a week ago.  No change in urinary pattern, no flank or abdominal symptoms at that time. Cysto-papillary lesions in prostatic urethra c/w recurrent TCCa bladder.  Past Medical History:  Diagnosis Date   Arthritis    DJD right knee   Benign prostatic hypertrophy with urinary frequency    Bladder cancer (HCC)    Complication of anesthesia    gets combative in recovery   COPD with emphysema (HCC)    Depression    Dyspnea    when walking   Dysrhythmia    a fib   History of colon polyps    History of kidney stones    History of loop recorder    loop recorder in place battery is dead has not had changed due to covid   History of pneumonia    hx Recurrent -- last bout Dec 2016 CAP-- per pt resolved   History of TIA (transient ischemic attack) no residual per pt   per neurologist note (dr Leta Baptist 11-08-2015) dx cryptogenic TIA versus arrhythia versus dysautonomia (right ophthalmic artery TIA and x3 posterior circulation TIAs   Hyperlipidemia    Hypertension    Multinodular thyroid    per pathology report 11-12-2014 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   Pulmonary nodule, left    left lower lobe x2 per CT 01-20-2016   Renal cyst, left    Stroke [facility])    TIA's   Thoracic aortic aneurysm without rupture    ascending -- ct chest 01/10/16  4.8cm   Wears hearing aid    bilateral-- wear at times    Past Surgical History:  Procedure Laterality Date   ANTERIOR CERVICAL DECOMP/DISCECTOMY FUSION N/A 04/03/2017   Procedure: Cervical three-four, Cervical four-five Anterior cervical decompression/discectomy/fusion;  Surgeon: Erline Levine, MD;  Location: [place];  Service: Neurosurgery;  Laterality: N/A;   CATARACT EXTRACTION W/ INTRAOCULAR LENS IMPLANT Right 2016   CATARACT EXTRACTION W/PHACO  12/16/2012   Procedure: CATARACT EXTRACTION PHACO AND INTRAOCULAR LENS PLACEMENT ([place]);  Surgeon: Tonny Branch, MD;  Location: [facility];  Service: Ophthalmology;  Laterality: Left;  CDE:17.31   COLONOSCOPY  10/03/2011   Procedure: COLONOSCOPY;  Surgeon: Jamesetta So;  [facility];  Service: Gastroenterology;  Laterality: N/A;   CYSTOSCOPY/RETROGRADE/URETEROSCOPY/STONE EXTRACTION WITH BASKET Right 03/29/2020   Procedure: CYSTOSCOPY/RETROGRADE/URETEROSCOPY/STONE EXTRACTION WITH BASKET;  Surgeon: Franchot Gallo, MD;  [facility];  Service: Urology;  Laterality: Right;  1 HR   DECOMPRESSIOIN ULNAR NERVE AND CUBITAL TUNNEL RELEASE Left 07-14-2009  elbow   EP IMPLANTABLE DEVICE N/A 08/10/2015   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   HOLMIUM LASER APPLICATION Right 03/25/9621   Procedure: HOLMIUM LASER APPLICATION;  Surgeon: Franchot Gallo, MD;  [facility];  Service: Urology;  Laterality: Right;   INGUINAL HERNIA REPAIR Right 1990's   KNEE ARTHROSCOPY Right 2015   LEFT CUBITAL TUNNEL RELEASE     POSTERIOR LUMBAR FUSION  2014   X2   SHOULDER ARTHROSCOPY Left  1990's   TEE WITHOUT CARDIOVERSION N/A 07/27/2015   Procedure: TRANSESOPHAGEAL ECHOCARDIOGRAM (TEE);  Surgeon: Lelon Perla, MD;  Location: [facility] ENDOSCOPY;  Service: Cardiovascular;  Laterality: N/A;   normal LV function, ef 55-60%,  mild AR and MR, mild dilated ascending aorta (4.2cm),  mild to moderate atherosclerosis  descending aorta,  mild to moderate TR,  negative saline microcavitation study   THYROIDECTOMY Right 01/20/2015   Procedure: RIGHT THYROIDECTOMY;  Surgeon: Ascencion Dike, MD;  Location: [facility] OR;  Service: ENT;  Laterality: Right;   TONSILLECTOMY  as child   TOTAL HIP ARTHROPLASTY Left 12/14/2020   Procedure: TOTAL HIP ARTHROPLASTY ANTERIOR APPROACH;  Surgeon: [name], MD;  [facility];  Service: Orthopedics;  Laterality: Left;   TRANSURETHRAL RESECTION OF BLADDER TUMOR N/A 05/15/2016   Procedure: TRANSURETHRAL RESECTION OF BLADDER TUMOR (TURBT) AND INSTILLATION OF EPIRUBICIN;  Surgeon: Franchot Gallo, MD;  Location: [facility];  Service: Urology;  Laterality: N/A;   TRANSURETHRAL RESECTION OF BLADDER TUMOR N/A 03/29/2020   Procedure: TRANSURETHRAL RESECTION OF BLADDER TUMOR (TURBT);  Surgeon: Franchot Gallo, MD;  [facility];  Service: Urology;  Laterality: N/A;    Home Medications:  Allergies as of 01/22/2022       Reactions   Flomax [tamsulosin Hcl] Nausea And Vomiting, Other (See Comments)   "Pt thought he was dying " DIZZY   Fentanyl    Makes me combative         Medication List      Notice   Cannot display discharge medications because the patient has not yet been admitted.     Allergies:  Allergies  Allergen Reactions   Flomax [Tamsulosin Hcl] Nausea And Vomiting and Other (See Comments)    "Pt thought he was dying " DIZZY   Fentanyl     Makes me combative     Family History  Problem Relation Age of Onset   Stroke Sister    Breast cancer Sister    Stroke Brother     Social History:  reports that he quit  smoking about 11 years ago. His smoking use included cigarettes. He has a 45.00 pack-year smoking history. He has never used smokeless tobacco. He reports that he does not drink alcohol and does not use drugs.  ROS: A complete review of systems was performed.  All systems are negative except for pertinent findings as noted.  Physical Exam:  Vital signs in last 24 hours: There were no vitals taken for this visit. 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

## 2022-01-23 ENCOUNTER — Encounter (HOSPITAL_COMMUNITY)
Admission: RE | Disposition: A | Discharge status: Home / Self Care | Payer: Self-pay | Source: Home / Self Care | Attending: Urology

## 2022-01-23 ENCOUNTER — Ambulatory Visit (HOSPITAL_COMMUNITY)
Admission: RE | Admit: 2022-01-23 | Discharge: 2022-01-23 | Disposition: A | Discharge status: Home / Self Care | Payer: Medicare Other | Attending: Urology | Admitting: Urology

## 2022-01-23 ENCOUNTER — Ambulatory Visit (HOSPITAL_COMMUNITY): Payer: Medicare Other | Admitting: Anesthesiology

## 2022-01-23 ENCOUNTER — Ambulatory Visit (HOSPITAL_COMMUNITY): Payer: Medicare Other | Admitting: Physician Assistant

## 2022-01-23 ENCOUNTER — Encounter (HOSPITAL_COMMUNITY): Payer: Self-pay | Admitting: Urology

## 2022-01-23 DIAGNOSIS — C680 Malignant neoplasm of urethra: Secondary | ICD-10-CM | POA: Diagnosis not present

## 2022-01-23 DIAGNOSIS — Z7901 Long term (current) use of anticoagulants: Secondary | ICD-10-CM | POA: Insufficient documentation

## 2022-01-23 DIAGNOSIS — J439 Emphysema, unspecified: Secondary | ICD-10-CM | POA: Diagnosis not present

## 2022-01-23 DIAGNOSIS — Z87891 Personal history of nicotine dependence: Secondary | ICD-10-CM | POA: Insufficient documentation

## 2022-01-23 DIAGNOSIS — C679 Malignant neoplasm of bladder, unspecified: Secondary | ICD-10-CM | POA: Diagnosis present

## 2022-01-23 DIAGNOSIS — Z79899 Other long term (current) drug therapy: Secondary | ICD-10-CM | POA: Insufficient documentation

## 2022-01-23 DIAGNOSIS — Z8551 Personal history of malignant neoplasm of bladder: Secondary | ICD-10-CM | POA: Diagnosis not present

## 2022-01-23 DIAGNOSIS — I7090 Unspecified atherosclerosis: Secondary | ICD-10-CM | POA: Diagnosis not present

## 2022-01-23 DIAGNOSIS — Z9981 Dependence on supplemental oxygen: Secondary | ICD-10-CM | POA: Insufficient documentation

## 2022-01-23 DIAGNOSIS — I480 Paroxysmal atrial fibrillation: Secondary | ICD-10-CM | POA: Insufficient documentation

## 2022-01-23 DIAGNOSIS — C678 Malignant neoplasm of overlapping sites of bladder: Secondary | ICD-10-CM

## 2022-01-23 DIAGNOSIS — I10 Essential (primary) hypertension: Secondary | ICD-10-CM | POA: Insufficient documentation

## 2022-01-23 DIAGNOSIS — Z8673 Personal history of transient ischemic attack (TIA), and cerebral infarction without residual deficits: Secondary | ICD-10-CM | POA: Diagnosis not present

## 2022-01-23 DIAGNOSIS — C673 Malignant neoplasm of anterior wall of bladder: Secondary | ICD-10-CM

## 2022-01-23 HISTORY — PX: TRANSURETHRAL RESECTION OF BLADDER TUMOR WITH MITOMYCIN-C: SHX6459

## 2022-01-23 LAB — PROTIME-INR
INR: 1 (ref 0.8–1.2)
Prothrombin Time: 13.2 seconds (ref 11.4–15.2)

## 2022-01-23 LAB — APTT: aPTT: 29 seconds (ref 24–36)

## 2022-01-23 SURGERY — TRANSURETHRAL RESECTION OF BLADDER TUMOR WITH MITOMYCIN-C
Anesthesia: General | Site: Bladder

## 2022-01-23 MED ORDER — LACTATED RINGERS IV SOLN: INTRAVENOUS | Status: DC

## 2022-01-23 MED ORDER — FENTANYL CITRATE PF 50 MCG/ML IJ SOSY
PREFILLED_SYRINGE | INTRAMUSCULAR | Status: AC
  Administered 2022-01-23: 25 ug via INTRAVENOUS
  Filled 2022-01-23: qty 2

## 2022-01-23 MED ORDER — ONDANSETRON HCL 4 MG/2ML IJ SOLN
INTRAMUSCULAR | Status: AC
  Filled 2022-01-23: qty 2

## 2022-01-23 MED ORDER — PHENYLEPHRINE HCL-NACL 20-0.9 MG/250ML-% IV SOLN
INTRAVENOUS | Status: DC | PRN
Start: 2022-01-23 — End: 2022-01-23
  Administered 2022-01-23: 35 ug/min via INTRAVENOUS

## 2022-01-23 MED ORDER — FENTANYL CITRATE (PF) 100 MCG/2ML IJ SOLN
INTRAMUSCULAR | Status: AC
  Filled 2022-01-23: qty 2

## 2022-01-23 MED ORDER — ORAL CARE MOUTH RINSE: 15.0000 mL | Freq: Once | OROMUCOSAL | Status: AC

## 2022-01-23 MED ORDER — ACETAMINOPHEN 500 MG PO TABS
1000.0000 mg | ORAL_TABLET | Freq: Once | ORAL | Status: AC
  Administered 2022-01-23: 1000 mg via ORAL
  Filled 2022-01-23: qty 2

## 2022-01-23 MED ORDER — LIDOCAINE HCL (PF) 2 % IJ SOLN
INTRAMUSCULAR | Status: AC
  Filled 2022-01-23: qty 5

## 2022-01-23 MED ORDER — ONDANSETRON HCL 4 MG/2ML IJ SOLN
INTRAMUSCULAR | Status: DC | PRN
  Administered 2022-01-23: 4 mg via INTRAVENOUS

## 2022-01-23 MED ORDER — STERILE WATER FOR IRRIGATION IR SOLN
Status: DC | PRN
  Administered 2022-01-23: 500 mL

## 2022-01-23 MED ORDER — PHENYLEPHRINE 40 MCG/ML (10ML) SYRINGE FOR IV PUSH (FOR BLOOD PRESSURE SUPPORT)
PREFILLED_SYRINGE | INTRAVENOUS | Status: DC | PRN
  Administered 2022-01-23: 80 ug via INTRAVENOUS

## 2022-01-23 MED ORDER — PROPOFOL 10 MG/ML IV BOLUS
INTRAVENOUS | Status: AC
  Filled 2022-01-23: qty 20

## 2022-01-23 MED ORDER — LIDOCAINE 2% (20 MG/ML) 5 ML SYRINGE
INTRAMUSCULAR | Status: DC | PRN
Start: 2022-01-23 — End: 2022-01-23
  Administered 2022-01-23: 60 mg via INTRAVENOUS

## 2022-01-23 MED ORDER — FENTANYL CITRATE (PF) 100 MCG/2ML IJ SOLN
INTRAMUSCULAR | Status: DC | PRN
Start: 2022-01-23 — End: 2022-01-23
  Administered 2022-01-23: 50 ug via INTRAVENOUS

## 2022-01-23 MED ORDER — GEMCITABINE CHEMO FOR BLADDER INSTILLATION 2000 MG
2000.0000 mg | Freq: Once | INTRAVENOUS | Status: DC
Start: 2022-01-23 — End: 2022-01-23

## 2022-01-23 MED ORDER — SODIUM CHLORIDE 0.9 % IR SOLN
Status: DC | PRN
  Administered 2022-01-23: 6000 mL

## 2022-01-23 MED ORDER — GEMCITABINE CHEMO FOR BLADDER INSTILLATION 2000 MG
2000.0000 mg | Freq: Once | INTRAVENOUS | Status: AC
  Administered 2022-01-23: 2000 mg via INTRAVESICAL
  Filled 2022-01-23: qty 2000

## 2022-01-23 MED ORDER — CEFAZOLIN SODIUM-DEXTROSE 2-4 GM/100ML-% IV SOLN
2.0000 g | INTRAVENOUS | Status: AC
  Administered 2022-01-23: 2 g via INTRAVENOUS
  Filled 2022-01-23: qty 100

## 2022-01-23 MED ORDER — PROPOFOL 10 MG/ML IV BOLUS
INTRAVENOUS | Status: DC | PRN
  Administered 2022-01-23: 20 mg via INTRAVENOUS
  Administered 2022-01-23: 100 mg via INTRAVENOUS
  Administered 2022-01-23: 30 mg via INTRAVENOUS

## 2022-01-23 MED ORDER — FENTANYL CITRATE PF 50 MCG/ML IJ SOSY: 25.0000 ug | PREFILLED_SYRINGE | INTRAMUSCULAR | Status: DC | PRN

## 2022-01-23 MED ORDER — ONDANSETRON HCL 4 MG/2ML IJ SOLN: 4.0000 mg | Freq: Once | INTRAMUSCULAR | Status: DC | PRN

## 2022-01-23 MED ORDER — PHENYLEPHRINE HCL (PRESSORS) 10 MG/ML IV SOLN
INTRAVENOUS | Status: DC | PRN
  Administered 2022-01-23: 80 ug via INTRAVENOUS

## 2022-01-23 MED ORDER — CHLORHEXIDINE GLUCONATE 0.12 % MT SOLN
15.0000 mL | Freq: Once | OROMUCOSAL | Status: AC
  Administered 2022-01-23: 15 mL via OROMUCOSAL

## 2022-01-23 SURGICAL SUPPLY — 28 items
BAG DRN RND TRDRP ANRFLXCHMBR (UROLOGICAL SUPPLIES) ×1
BAG URINE DRAIN 2000ML AR STRL (UROLOGICAL SUPPLIES) ×1 IMPLANT
BAG URO CATCHER STRL LF (MISCELLANEOUS) ×3 IMPLANT
CATH FOLEY 2WAY SLVR  5CC 18FR (CATHETERS) ×2
CATH FOLEY 2WAY SLVR 30CC 24FR (CATHETERS) IMPLANT
CATH FOLEY 2WAY SLVR 5CC 18FR (CATHETERS) IMPLANT
DRAPE FOOT SWITCH (DRAPES) ×3 IMPLANT
EVACUATOR MICROVAS BLADDER (UROLOGICAL SUPPLIES) IMPLANT
GLOVE SRG 8 PF TXTR STRL LF DI (GLOVE) IMPLANT
GLOVE SURG ENC MOIS LTX SZ8 (GLOVE) ×1 IMPLANT
GLOVE SURG ENC TEXT LTX SZ8 (GLOVE) ×3 IMPLANT
GLOVE SURG UNDER POLY LF SZ8 (GLOVE) ×2
GOWN STRL REUS W/TWL XL LVL3 (GOWN DISPOSABLE) ×4 IMPLANT
HOLDER FOLEY CATH W/STRAP (MISCELLANEOUS) ×1 IMPLANT
IV NS IRRIG 3000ML ARTHROMATIC (IV SOLUTION) ×2 IMPLANT
KIT TURNOVER KIT A (KITS) IMPLANT
LOOP CUT BIPOLAR 24F LRG (ELECTROSURGICAL) ×3 IMPLANT
MANIFOLD NEPTUNE II (INSTRUMENTS) ×3 IMPLANT
NDL SAFETY ECLIPSE 18X1.5 (NEEDLE) ×2 IMPLANT
NEEDLE HYPO 18GX1.5 SHARP (NEEDLE) ×2
PACK CYSTO (CUSTOM PROCEDURE TRAY) ×3 IMPLANT
PLUG CATH AND CAP STER (CATHETERS) ×1 IMPLANT
SYR TOOMEY IRRIG 70ML (MISCELLANEOUS)
SYRINGE TOOMEY IRRIG 70ML (MISCELLANEOUS) IMPLANT
TUBING CONNECTING 10 (TUBING) ×3 IMPLANT
TUBING UROLOGY SET (TUBING) ×3 IMPLANT
WATER STERILE IRR 3000ML UROMA (IV SOLUTION) ×2 IMPLANT
WATER STERILE IRR 500ML POUR (IV SOLUTION) ×1 IMPLANT

## 2022-01-23 NOTE — Transfer of Care (Signed)
Immediate Anesthesia Transfer of Care Note  Patient: Kyle Owen  Procedure(s) Performed: TRANSURETHRAL RESECTION OF BLADDER TUMOR WITH POST OPERATIVE GEMCITABINE INSTILLATION (Bladder)  Patient Location: PACU  Anesthesia Type:General  Level of Consciousness: awake, alert  and oriented  Airway & Oxygen Therapy: Patient Spontanous Breathing and Patient connected to face mask oxygen  Post-op Assessment: Report given to RN and Post -op Vital signs reviewed and stable  Post vital signs: Reviewed and stable  Last Vitals:  Vitals Value Taken Time  BP 108/93 01/23/22 0845  Temp    Pulse 65 01/23/22 0846  Resp 15 01/23/22 0846  SpO2 100 % 01/23/22 0846  Vitals shown include unvalidated device data.  Last Pain:  Vitals:   01/23/22 0648  TempSrc:   PainSc: 0-No pain         Complications: No notable events documented.

## 2022-01-23 NOTE — Interval H&P Note (Signed)
History and Physical Interval Note:  01/23/2022 7:58 AM  Kyle Owen  has presented today for surgery, with the diagnosis of recurrent bladder cancer.  The various methods of treatment have been discussed with the patient and family. After consideration of risks, benefits and other options for treatment, the patient has consented to  Procedure(s): TRANSURETHRAL RESECTION OF BLADDER TUMOR WITH GEMCITABINE (N/A) as a surgical intervention.  The patient's history has been reviewed, patient examined, no change in status, stable for surgery.  I have reviewed the patient's chart and labs.  Questions were answered to the patient's satisfaction.     Lillette Boxer [name]

## 2022-01-23 NOTE — Discharge Instructions (Addendum)
You may see some blood in the urine and may have some burning with urination for 48-72 hours. You also may notice that you have to urinate more frequently or urgently after your procedure which is normal.  You should call should you develop an inability urinate, fever > 101, persistent nausea and vomiting that prevents you from eating or drinking to stay hydrated. If you have a catheter, you will be taught how to take care of the catheter by the nursing staff prior to discharge from the hospital.  You may periodically feel a strong urge to void with the catheter in place.  This is a bladder spasm and most often can occur when having a bowel movement or moving around. It is typically self-limited and usually will stop after a few minutes.  You may use some Vaseline or Neosporin around the tip of the catheter to reduce friction at the tip of the penis. You may also see some blood in the urine.  A very small amount of blood can make the urine look quite red.  As long as the catheter is draining well, there usually is not a problem.  However, if the catheter is not draining well and is bloody, you should call the office [phone]) to notify us.  If the urine is clear, it is okay to remove the catheter on Tuesday morning as instructed by the nursing staff.       4.  It is okay to resume Eliquis on Tuesday if urine is mostly yellow.

## 2022-01-23 NOTE — Anesthesia Procedure Notes (Signed)
Procedure Name: LMA Insertion Date/Time: 01/23/2022 8:12 AM Performed by: Maxwell Caul, CRNA Pre-anesthesia Checklist: Patient identified, Emergency Drugs available, Suction available and Patient being monitored Patient Re-evaluated:Patient Re-evaluated prior to induction Preoxygenation: Pre-oxygenation with 100% oxygen Induction Type: IV induction LMA: LMA with gastric port inserted LMA Size: 4.0 Number of attempts: 1 Placement Confirmation: positive ETCO2 and breath sounds checked- equal and bilateral Tube secured with: Tape Dental Injury: Teeth and Oropharynx as per pre-operative assessment

## 2022-01-23 NOTE — Anesthesia Postprocedure Evaluation (Signed)
Anesthesia Post Note  Patient: Kyle Owen  Procedure(s) Performed: TRANSURETHRAL RESECTION OF BLADDER TUMOR WITH POST OPERATIVE GEMCITABINE INSTILLATION (Bladder)     Patient location during evaluation: PACU Anesthesia Type: General Level of consciousness: awake and alert, oriented and patient cooperative Pain management: pain level controlled Vital Signs Assessment: post-procedure vital signs reviewed and stable Respiratory status: spontaneous breathing, nonlabored ventilation and respiratory function stable Cardiovascular status: blood pressure returned to baseline and stable Postop Assessment: no apparent nausea or vomiting Anesthetic complications: no   No notable events documented.  Last Vitals:  Vitals:   01/23/22 0845 01/23/22 0900  BP: (!) 108/93 (!) 136/102  Pulse: 63 65  Resp: (!) 21 18  Temp:  36.6 C  SpO2: 100% 93%    Last Pain:  Vitals:   01/23/22 0648  TempSrc:   PainSc: 0-No pain                 Pervis Hocking

## 2022-01-23 NOTE — Op Note (Addendum)
Preoperative diagnosis: Recurrent urothelial carcinoma the bladder  Postoperative diagnosis: Same  Principal procedure: Cystoscopy, bladder biopsy, transurethral resection of recurrent tumor in prostatic urethra (2 cm), placement of intravesical gemcitabine  Surgeon: Beaulah Romanek  Anesthesia: General with LMA  Complications: None  Drains: 18 French Foley catheter  Specimens: 1.  Bladder lesion 2.  Prostatic urethral lesion  Estimated blood loss: None  Indications: 81 year old male with history of recurrent urothelial carcinoma of the bladder.  Recent cystoscopy in December revealed polypoid lesions in the left prostatic urethra.  This was indicative of recurrent urothelial carcinoma within the prostatic urethra.  He did have CT hematuria protocol which revealed normal upper tracts and no bulky bladder lesions.  He presents at this time for cystoscopy, transurethral resection of prostatic urethral lesions.  The patient is aware of risks and complications of the procedure which have been discussed with him preoperatively.  He understands and desires to proceed.  Findings: Urethra was free of lesions up to the prostatic urethra.  Prostate was nonobstructive but had papillary lesions on the left prostatic lobe up to the bladder neck.  There were no polypoid/papillary lesions within the bladder but 1 small area of erythema just superior to the interureteric ridge.  No other bladder lesions were present.  Right ureteral orifice was of golf-hole configuration, left was normal.  Description of procedure: The patient was properly identified in the holding area and was taken to the operating room where general anesthetic was administered with the LMA.  He is placed in the dorsolithotomy position.  Genitalia and perineum were prepped, draped, proper timeout performed.  21 French panendoscope passed into the bladder.  Systematic inspection was performed.  The above-mentioned findings noted.  I biopsied the  erythematous lesion using cold cup forceps.  2 biopsies were taken.  These were labeled "bladder biopsies".  I then switched out the cystoscope for the resectoscope sheath, 26 [place] which was passed using the visual obturator.  Resectoscope and cutting loop were then placed.  The biopsy site within the bladder was cauterized.  I then resected the papillary lesions on the left prostatic lobe using the cutting loop.  All abnormal tissue was removed.  These fragments were sent labeled "prostatic urethral lesions.  Resected site was then carefully cauterized, hemostasis was adequate at this time.  I then reinspected the bladder biopsy site as well as the resected prostatic urethra.  There was continued hemostasis.  The scope was removed.  I passed an 75 French Foley catheter, balloon filled with 10 cc of water and hooked to dependent drainage.  This point, the procedure was terminated.  The patient was awakened, taken to the PACU in stable condition having tolerated the procedure well.  In the recovery room, I placed 2 g of gemcitabine which was left indwelling for 1 hour and then drained.

## 2022-01-24 ENCOUNTER — Encounter (HOSPITAL_COMMUNITY): Payer: Self-pay | Admitting: Urology

## 2022-01-24 LAB — SURGICAL PATHOLOGY

## 2022-02-07 ENCOUNTER — Other Ambulatory Visit: Payer: Self-pay

## 2022-02-07 ENCOUNTER — Encounter: Payer: Self-pay | Admitting: Urology

## 2022-02-07 ENCOUNTER — Ambulatory Visit (INDEPENDENT_AMBULATORY_CARE_PROVIDER_SITE_OTHER): Payer: Medicare Other | Admitting: Urology

## 2022-02-07 VITALS — BP 146/79 | HR 73 | Wt 212.2 lb

## 2022-02-07 DIAGNOSIS — C678 Malignant neoplasm of overlapping sites of bladder: Secondary | ICD-10-CM | POA: Diagnosis not present

## 2022-02-07 NOTE — Progress Notes (Signed)
History of Present Illness:   4.5.20221-Underwent TURBT/Rt URS/stone mgmt.   Initial TURBT of a 2 cm papillary bladder tumor on 5.22.2017.  Pathology revealed papillary low-grade NMIBC. Epirubicin was placed post resection.   4.5.2021: He underwent cystoscopy, biopsy of papillary lesions at the right ureteral orifice (path--urothelial papilloma), right ureteroscopy with holmium laser and extraction of stone as well as stent placement.  He has since remove the stent.  He has passed multiple small fragments.   7.12.2022: Cystoscopy negative  12.13.2022: Here early for follow-up.  Had 3 to 4 days of total gross painless hematuria which cleared up about a week ago.  No change in urinary pattern, no flank or abdominal symptoms at that time.  His CT scan for hematuria evaluation was negative.  1.30.2023: He underwent cystoscopy, bladder biopsy and TUR of papillary lesions within the prostatic urethra.  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  2.14.2023: He has urgency and frequency since his procedure but no current dysuria or gross hematuria.  His wife and daughter accompany him to the visit today.    Past Medical History:  Diagnosis Date   Arthritis    DJD right knee   Benign prostatic hypertrophy with urinary frequency    Bladder cancer (HCC)    Complication of anesthesia    gets combative in recovery   COPD with emphysema (HCC)    Depression    Dyspnea    when walking   Dysrhythmia    a fib   History of colon polyps    History of kidney stones    History of loop recorder    loop recorder in place battery is dead has not had changed due to covid   History of pneumonia    hx Recurrent -- last bout Dec 2016 CAP-- per pt resolved   History of TIA (transient ischemic attack) no residual per pt   per neurologist note (dr Leta Baptist 11-08-2015) dx cryptogenic TIA versus arrhythia versus dysautonomia (right ophthalmic  artery TIA and x3 posterior circulation TIAs   Hyperlipidemia    Hypertension    Multinodular thyroid    per pathology report 11-12-2014 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   Pulmonary nodule, left    left lower lobe x2 per CT 01-20-2016   Renal cyst, left    Stroke [facility])    TIA's   Thoracic aortic aneurysm without rupture    ascending -- ct chest 01/10/16  4.8cm   Wears hearing aid    bilateral-- wear at times    Past Surgical History:  Procedure Laterality Date   ANTERIOR CERVICAL DECOMP/DISCECTOMY FUSION N/A 04/03/2017   Procedure: Cervical three-four, Cervical four-five Anterior cervical decompression/discectomy/fusion;  Surgeon: Erline Levine, MD;  Location: [place];  Service: Neurosurgery;  Laterality: N/A;   CATARACT EXTRACTION W/ INTRAOCULAR LENS IMPLANT Right 2016   CATARACT EXTRACTION W/PHACO  12/16/2012   Procedure: CATARACT EXTRACTION PHACO AND INTRAOCULAR LENS PLACEMENT ([place]);  Surgeon: Tonny Branch, MD;  Location: [facility];  Service: Ophthalmology;  Laterality: Left;  CDE:17.31   COLONOSCOPY  10/03/2011   Procedure: COLONOSCOPY;  Surgeon: Jamesetta So;  Location: [facility];  Service: Gastroenterology;  Laterality: N/A;   CYSTOSCOPY/RETROGRADE/URETEROSCOPY/STONE EXTRACTION WITH BASKET Right 03/29/2020   Procedure: CYSTOSCOPY/RETROGRADE/URETEROSCOPY/STONE EXTRACTION WITH BASKET;  Surgeon: Franchot Gallo, MD;  Location: [facility];  Service: Urology;  Laterality: Right;  1 HR   DECOMPRESSIOIN ULNAR NERVE AND CUBITAL TUNNEL RELEASE Left 07-14-2009   elbow   EP IMPLANTABLE DEVICE N/A 08/10/2015   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   HOLMIUM LASER APPLICATION Right 03/27/3294    Procedure: HOLMIUM LASER APPLICATION;  Surgeon: Franchot Gallo, MD;  Location: [facility];  Service: Urology;  Laterality: Right;   INGUINAL HERNIA REPAIR Right 1990's   KNEE ARTHROSCOPY Right 2015   LEFT CUBITAL TUNNEL RELEASE     POSTERIOR LUMBAR FUSION  2014   X2   SHOULDER ARTHROSCOPY Left 1990's   TEE WITHOUT CARDIOVERSION N/A 07/27/2015   Procedure: TRANSESOPHAGEAL ECHOCARDIOGRAM (TEE);  Surgeon: Lelon Perla, MD;  Location: [facility];  Service: Cardiovascular;  Laterality: N/A;   normal LV function, ef 55-60%,  mild AR and MR, mild dilated ascending aorta (4.2cm),  mild to moderate atherosclerosis  descending aorta,  mild to moderate TR,  negative saline microcavitation study   THYROIDECTOMY Right 01/20/2015   Procedure: RIGHT THYROIDECTOMY;  Surgeon: Ascencion Dike, MD;  Location[facility] OR;  Service: ENT;  Laterality: Right;   TONSILLECTOMY  as child   TOTAL HIP ARTHROPLASTY Left 12/14/2020   Procedure: TOTAL HIP ARTHROPLASTY ANTERIOR APPROACH;  Surgeon: [name], MD;  Location: [facility];  Service: Orthopedics;  Laterality: Left;   TRANSURETHRAL RESECTION OF BLADDER TUMOR N/A 05/15/2016   Procedure: TRANSURETHRAL RESECTION OF BLADDER TUMOR (TURBT) AND INSTILLATION OF EPIRUBICIN;  Surgeon: Franchot Gallo, MD;  Location: [facility];  Service: Urology;  Laterality: N/A;   TRANSURETHRAL RESECTION OF BLADDER TUMOR N/A 03/29/2020   Procedure: TRANSURETHRAL RESECTION OF BLADDER TUMOR (TURBT);  Surgeon: Franchot Gallo, MD;  Location: [facility];  Service: Urology;  Laterality: N/A;   TRANSURETHRAL RESECTION OF BLADDER TUMOR WITH MITOMYCIN-C N/A 01/23/2022   Procedure: TRANSURETHRAL RESECTION OF BLADDER TUMOR WITH POST OPERATIVE GEMCITABINE INSTILLATION;  Surgeon: Franchot Gallo, MD;  Location: [facility];  Service: Urology;  Laterality: N/A;    Home Medications:  Allergies as of 02/07/2022       Reactions   Flomax [tamsulosin Hcl] Nausea And Vomiting, Other (See Comments)    "Pt thought he was dying " DIZZY        Medication List        Accurate as of February 07, 2022 11:44 AM. If you have any questions, ask your nurse or doctor.          acetaminophen 500 MG tablet Commonly known as: TYLENOL Take 2 tablets (1,000 mg total) by mouth every 6 (six) hours. What changed:  how much to take when to take this   atorvastatin 20 MG tablet Commonly known as: LIPITOR TAKE 1 TABLET BY MOUTH DAILY.   Breztri Aerosphere 160-9-4.8 MCG/ACT Aero Generic drug: Budeson-Glycopyrrol-Formoterol INHALE (2) PUFFS INTO THE LUNGS IN THE MORNING AND AT BEDTIME   finasteride 5 MG tablet Commonly known as: PROSCAR Take 5 mg by mouth daily.   loratadine 10 MG tablet Commonly known as: CLARITIN Take 10 mg by mouth daily.   Magnesium 500 MG Tabs Take 500 mg by mouth daily.   sertraline 50 MG tablet Commonly known as: ZOLOFT Take 50 mg by mouth every morning.   Ventolin HFA 108 (90 Base) MCG/ACT inhaler Generic drug: albuterol Inhale 2 puffs into the lungs  Every 4 hours as needed for wheezing or shortness of breath.   verapamil 240 MG CR tablet Commonly known as: CALAN-SR Take 240 mg by mouth daily.   Vitamin D 50 MCG (2000 UT) tablet Take 2,000 Units by mouth daily.        Allergies:  Allergies  Allergen Reactions   Flomax [Tamsulosin Hcl] Nausea And Vomiting and Other (See Comments)    "Pt thought he was dying " DIZZY    Family History  Problem Relation Age of Onset   Stroke Sister    Breast cancer Sister    Stroke Brother     Social History:  reports that he quit smoking about 11 years ago. His smoking use included cigarettes. He has a 45.00 pack-year smoking history. He has never used smokeless tobacco. He reports that he does not drink alcohol and does not use drugs.  ROS: A complete review of systems was performed.  All systems are negative except for pertinent findings as noted.  Physical Exam:  Vital signs in last 24  hours: BP (!) 146/79    Pulse 73    Wt 212 lb 3.2 oz (96.3 kg)    BMI 28.00 kg/m  Constitutional:  Alert and oriented, No acute distress Cardiovascular: Regular rate  Respiratory: Normal respiratory effort Neurologic: Grossly intact, no focal deficits Psychiatric: Normal mood and affect  I have reviewed prior pt notes  I have reviewed notes from referring/previous physicians  I have reviewed urinalysis results  I have independently reviewed prior imaging  I reviewed biopsy results with the patient and his family   Impression/Assessment:  1.  Recurrent urothelial carcinoma-CIS within the bladder, papillary lesions within the prostatic urethra  2.  Urinary frequency and urgency since his procedure  Plan:  1.  He was given samples of Myrbetriq 50 mg for a month  2.  I will have him come back within the next week or so to start 6 weeks of BCG  3.  Cystoscopy in 3 months

## 2022-02-08 LAB — URINALYSIS, ROUTINE W REFLEX MICROSCOPIC
Bilirubin, UA: NEGATIVE
Glucose, UA: NEGATIVE
Ketones, UA: NEGATIVE
Nitrite, UA: NEGATIVE
Protein,UA: NEGATIVE
Specific Gravity, UA: 1.025 (ref 1.005–1.030)
Urobilinogen, Ur: 0.2 mg/dL (ref 0.2–1.0)
pH, UA: 5.5 (ref 5.0–7.5)

## 2022-02-08 LAB — MICROSCOPIC EXAMINATION
Bacteria, UA: NONE SEEN
Renal Epithel, UA: NONE SEEN /hpf

## 2022-02-13 ENCOUNTER — Other Ambulatory Visit: Payer: Self-pay

## 2022-02-13 ENCOUNTER — Ambulatory Visit (HOSPITAL_COMMUNITY): Payer: Medicare Other | Attending: Neurosurgery | Admitting: Physical Therapy

## 2022-02-13 DIAGNOSIS — M47812 Spondylosis without myelopathy or radiculopathy, cervical region: Secondary | ICD-10-CM | POA: Diagnosis present

## 2022-02-13 NOTE — Patient Instructions (Signed)
Tennis ball massage with pillowcase Thoracic spine Tennis ball massage with towel for UT

## 2022-02-13 NOTE — Therapy (Signed)
[facility] [place] [address] [place], [place], [address] Phone: [phone]   Fax:  [phone]  Physical Therapy Evaluation  Patient Details  Name: Kyle Owen MRN: [id number] Date of Birth: 12/16/1941 Referring Provider (PT): Marvis Moeller, NP   Encounter Date: 02/13/2022   PT End of Session - 02/13/22 1235     Visit Number 1    Number of Visits 1    Authorization Type UHC MCR PPO no auth follow MCR guidelines    PT Start Time 4268    PT Stop Time 1116    PT Time Calculation (min) 40 min    Activity Tolerance Patient tolerated treatment well    Behavior During Therapy [facility] for tasks assessed/performed             Past Medical History:  Diagnosis Date   Arthritis    DJD right knee   Benign prostatic hypertrophy with urinary frequency    Bladder cancer ([place])    Complication of anesthesia    gets combative in recovery   COPD with emphysema ([place])    Depression    Dyspnea    when walking   Dysrhythmia    a fib   History of colon polyps    History of kidney stones    History of loop recorder    loop recorder in place battery is dead has not had changed due to covid   History of pneumonia    hx Recurrent -- last bout Dec 2016 CAP-- per pt resolved   History of TIA (transient ischemic attack) no residual per pt   per neurologist note (dr Leta Baptist 11-08-2015) dx cryptogenic TIA versus arrhythia versus dysautonomia (right ophthalmic artery TIA and x3 posterior circulation TIAs   Hyperlipidemia    Hypertension    Multinodular thyroid    per pathology report 11-12-2014 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   Pulmonary nodule, left    left lower  lobe x2 per CT 01-20-2016   Renal cyst, left    Stroke [facility])    TIA's   Thoracic aortic aneurysm without rupture    ascending -- ct chest 01/10/16  4.8cm   Wears hearing aid    bilateral-- wear at times    Past Surgical History:  Procedure Laterality Date   ANTERIOR CERVICAL DECOMP/DISCECTOMY FUSION N/A 04/03/2017   Procedure: Cervical three-four, Cervical four-five Anterior cervical decompression/discectomy/fusion;  Surgeon: Erline Levine, MD;  Location: [place];  Service: Neurosurgery;  Laterality: N/A;   CATARACT EXTRACTION W/ INTRAOCULAR LENS IMPLANT Right 2016   CATARACT EXTRACTION W/PHACO  12/16/2012   Procedure: CATARACT EXTRACTION PHACO AND INTRAOCULAR LENS PLACEMENT ([place]);  Surgeon: Tonny Branch, MD;  [facility];  Service: Ophthalmology;  Laterality: Left;  CDE:17.31   COLONOSCOPY  10/03/2011   Procedure: COLONOSCOPY;  Surgeon: Jamesetta So;  [facility];  Service: Gastroenterology;  Laterality: N/A;   CYSTOSCOPY/RETROGRADE/URETEROSCOPY/STONE EXTRACTION WITH BASKET Right 03/29/2020   Procedure: CYSTOSCOPY/RETROGRADE/URETEROSCOPY/STONE EXTRACTION WITH BASKET;  Surgeon: Franchot Gallo, MD;  [facility];  Service: Urology;  Laterality: Right;  1 HR   DECOMPRESSIOIN ULNAR NERVE AND CUBITAL TUNNEL RELEASE Left 07-14-2009   elbow  EP IMPLANTABLE DEVICE N/A 08/10/2015   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   HOLMIUM LASER APPLICATION Right 0/07/6577   Procedure: HOLMIUM LASER APPLICATION;  Surgeon: Franchot Gallo, MD;  [facility];  Service: Urology;  Laterality: Right;   INGUINAL HERNIA REPAIR Right 1990's   KNEE ARTHROSCOPY Right 2015   LEFT CUBITAL TUNNEL RELEASE     POSTERIOR LUMBAR FUSION  2014   X2   SHOULDER ARTHROSCOPY Left 1990's   TEE WITHOUT CARDIOVERSION N/A 07/27/2015   Procedure: TRANSESOPHAGEAL ECHOCARDIOGRAM (TEE);  Surgeon: Lelon Perla, MD;  Location: [facility] ENDOSCOPY;  Service: Cardiovascular;  Laterality: N/A;   normal  LV function, ef 55-60%,  mild AR and MR, mild dilated ascending aorta (4.2cm),  mild to moderate atherosclerosis  descending aorta,  mild to moderate TR,  negative saline microcavitation study   THYROIDECTOMY Right 01/20/2015   Procedure: RIGHT THYROIDECTOMY;  Surgeon: Ascencion Dike, MD;  Location: [facility] OR;  Service: ENT;  Laterality: Right;   TONSILLECTOMY  as child   TOTAL HIP ARTHROPLASTY Left 12/14/2020   Procedure: TOTAL HIP ARTHROPLASTY ANTERIOR APPROACH;  Surgeon: [name], MD;  [facility];  Service: Orthopedics;  Laterality: Left;   TRANSURETHRAL RESECTION OF BLADDER TUMOR N/A 05/15/2016   Procedure: TRANSURETHRAL RESECTION OF BLADDER TUMOR (TURBT) AND INSTILLATION OF EPIRUBICIN;  Surgeon: Franchot Gallo, MD;  Location: [facility];  Service: Urology;  Laterality: N/A;   TRANSURETHRAL RESECTION OF BLADDER TUMOR N/A 03/29/2020   Procedure: TRANSURETHRAL RESECTION OF BLADDER TUMOR (TURBT);  Surgeon: Franchot Gallo, MD;  [facility];  Service: Urology;  Laterality: N/A;   TRANSURETHRAL RESECTION OF BLADDER TUMOR WITH MITOMYCIN-C N/A 01/23/2022   Procedure: TRANSURETHRAL RESECTION OF BLADDER TUMOR WITH POST OPERATIVE GEMCITABINE INSTILLATION;  Surgeon: Franchot Gallo, MD;  [facility];  Service: Urology;  Laterality: N/A;    There were no vitals filed for this visit.    Subjective Assessment - 02/13/22 1228     Subjective Patient reports that there was an increase in his neck pain following a visit from his daughter during which they worked on a 1,000 piece puzzle over the course of 2 days. He states that he has had some discomfort in his neck since the fusion he received, but not to this level. He began using the same cervical collar he was given post surgery and this has essentially eliminated his pain after a few days of use to the point he and his wife say he was debating canceling today's evaluation. He states that he has a cervical traction device  at home and wonders if it is safe to use given the presence of the fusion.    Patient is accompained by: Family member    Pertinent History left THA on 12/14/20, Lx fusion, cervical fusion, neck pain, growths on bladder/prostate    Currently in Pain? Yes    Pain Score 1     Pain Location Neck    Pain Type Acute pain    Pain Onset 1 to 4 weeks ago    Pain Frequency Intermittent    Pain Relieving Factors Cervical collar                [facility] PT Assessment - 02/13/22 0001       Assessment   Medical Diagnosis Cervical Arthropathy    Referring Provider (PT) Marvis Moeller, NP    Onset Date/Surgical Date 01/30/22      Home Environment  Living Environment Private residence    Living Arrangements Spouse/significant other    Available Help at Discharge Family    Type of [place]      Prior Function   Level of [place] with community mobility with device      Cognition   Overall Cognitive Status Within Functional Limits for tasks assessed      ROM / Strength   AROM / PROM / Strength AROM      AROM   AROM Assessment Site Cervical    Cervical Flexion 50% limited    Cervical Extension 85% limited    Cervical - Right Rotation 60% limited    Cervical - Left Rotation 40% limited                        Objective measurements completed on examination: See above findings.                PT Education - 02/13/22 1234     Education Details HEP, POC, and spine dynamics following spinal fusions.    Person(s) Educated Patient;Spouse    Methods Explanation;Demonstration    Comprehension Verbalized understanding              PT Short Term Goals - 12/06/21 1416       PT SHORT TERM GOAL #1   Title Patient will report importance of daily exercises which include balance to improve overall balance    Time 2    Period Weeks    Status New    Target Date 12/20/21                       Plan - 02/13/22 1237      Clinical Impression Statement Patient attended today's session despite having nearly fully returned to prior level of function from use of a cervical collar. Basedon the patient's subjective report, the main event which points to the acute increase in cervical pain was the high degree of time spent in cervical flexion in order to work on a large puzzle at home with his daughter 2 weeks prior. Based on his level of function, I do not believe that the patient requires skilled therapy in order have continued improvement of function. His cervical AROM is still limited, but this is expected as he has had a cervical fusion. The patient and his wife are both in agreement that this should be a "one and done" situation and that there is no ongoing need as he has drastically improved in pain level from the time at which he sought out the referral. I did advice the patient against the use of his cervical traction device due to the present of the cervical fusion. Both he and his wife acknowledged this recommendation verbally. I did also inform him that there is no issue with occasionally using the cervical collar in order to limit any pain flare ups although he should avoid frequent and prolonged use.    Personal Factors and Comorbidities Age;Past/Current Experience    Comorbidities neck and back fusions, left THA last december    Stability/Clinical Decision Making Stable/Uncomplicated    Clinical Decision Making Low    Rehab Potential Fair    PT Frequency One time visit    PT Treatment/Interventions ADLs/Self Care Home Management;Patient/family education;Therapeutic exercise    PT Next Visit Plan one time visit    PT Home Exercise Plan Tennis ball massage w/ towel and/or pillow case.  Consulted and Agree with Plan of Care Patient;Family member/caregiver    Family Member Consulted wife             Patient will benefit from skilled therapeutic intervention in order to improve the following deficits and  impairments:  Pain, Decreased strength, Decreased balance, Decreased activity tolerance, Difficulty walking  Visit Diagnosis: Facet arthropathy, cervical     Problem List Patient Active Problem List   Diagnosis Date Noted   Chronic anticoagulation 10/06/2021   History of stroke 10/06/2021   Bladder cancer ([place]) 10/06/2021   Morning headache 04/04/2021   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

## 2022-02-14 ENCOUNTER — Encounter: Payer: Medicare Other | Admitting: Urology

## 2022-02-14 ENCOUNTER — Ambulatory Visit (INDEPENDENT_AMBULATORY_CARE_PROVIDER_SITE_OTHER): Payer: Medicare Other

## 2022-02-14 DIAGNOSIS — C679 Malignant neoplasm of bladder, unspecified: Secondary | ICD-10-CM

## 2022-02-14 DIAGNOSIS — R3129 Other microscopic hematuria: Secondary | ICD-10-CM | POA: Diagnosis not present

## 2022-02-14 LAB — URINALYSIS, ROUTINE W REFLEX MICROSCOPIC
Bilirubin, UA: NEGATIVE
Glucose, UA: NEGATIVE
Ketones, UA: NEGATIVE
Nitrite, UA: NEGATIVE
Protein,UA: NEGATIVE
Specific Gravity, UA: 1.025 (ref 1.005–1.030)
Urobilinogen, Ur: 0.2 mg/dL (ref 0.2–1.0)
pH, UA: 5.5 (ref 5.0–7.5)

## 2022-02-14 LAB — MICROSCOPIC EXAMINATION
Bacteria, UA: NONE SEEN
Renal Epithel, UA: NONE SEEN /hpf

## 2022-02-14 MED ORDER — BCG LIVE 50 MG IS SUSR
3.2400 mL | Freq: Once | INTRAVESICAL | Status: AC
  Administered 2022-02-14: 81 mg via INTRAVESICAL

## 2022-02-14 NOTE — Progress Notes (Signed)
BCG Bladder Instillation  BCG # 1 of 6  Due to Bladder Cancer patient is present today for a BCG treatment. Patient was cleaned and prepped in a sterile fashion with betadine. A 14FR catheter was inserted, urine return was noted 47ml, urine was yellow in color.  3ml of reconstituted BCG was instilled into the bladder. The catheter was then removed. Patient tolerated well, no complications were noted  Performed by: Sammye Staff LPN  Follow up/ Additional notes: keep next scheduled [place]

## 2022-02-14 NOTE — Patient Instructions (Signed)

## 2022-02-21 ENCOUNTER — Ambulatory Visit: Payer: Medicare Other

## 2022-02-21 ENCOUNTER — Ambulatory Visit: Payer: Medicare Other | Admitting: Physician Assistant

## 2022-02-22 ENCOUNTER — Ambulatory Visit (INDEPENDENT_AMBULATORY_CARE_PROVIDER_SITE_OTHER): Payer: Medicare Other

## 2022-02-22 ENCOUNTER — Other Ambulatory Visit: Payer: Self-pay

## 2022-02-22 DIAGNOSIS — C678 Malignant neoplasm of overlapping sites of bladder: Secondary | ICD-10-CM

## 2022-02-22 MED ORDER — BCG LIVE 50 MG IS SUSR
3.2400 mL | Freq: Once | INTRAVESICAL | Status: AC
  Administered 2022-02-22: 81 mg via INTRAVESICAL

## 2022-02-22 NOTE — Progress Notes (Signed)
BCG Bladder Instillation ? ?BCG # 2 of 6 ? ?Due to Bladder Cancer patient is present today for a BCG treatment. Patient was cleaned and prepped in a sterile fashion with betadine. A 14FR catheter was inserted, urine return was noted 65ml, urine was yellow in color.  59ml of reconstituted BCG was instilled into the bladder. The catheter was then removed. Patient tolerated well, no complications were noted ? ?Performed by: Estill Bamberg RN ? ?Follow up/ Additional notes:  weekly BCG ?  ?

## 2022-02-22 NOTE — Patient Instructions (Signed)

## 2022-02-23 LAB — URINALYSIS, ROUTINE W REFLEX MICROSCOPIC
Bilirubin, UA: NEGATIVE
Glucose, UA: NEGATIVE
Ketones, UA: NEGATIVE
Nitrite, UA: NEGATIVE
Specific Gravity, UA: 1.025 (ref 1.005–1.030)
Urobilinogen, Ur: 0.2 mg/dL (ref 0.2–1.0)
pH, UA: 5.5 (ref 5.0–7.5)

## 2022-02-23 LAB — MICROSCOPIC EXAMINATION: Renal Epithel, UA: NONE SEEN /hpf

## 2022-02-28 ENCOUNTER — Other Ambulatory Visit: Payer: Self-pay

## 2022-02-28 ENCOUNTER — Ambulatory Visit (INDEPENDENT_AMBULATORY_CARE_PROVIDER_SITE_OTHER): Payer: Medicare Other | Admitting: Physician Assistant

## 2022-02-28 VITALS — BP 154/81 | HR 68 | Ht 73.0 in | Wt 215.0 lb

## 2022-02-28 DIAGNOSIS — C678 Malignant neoplasm of overlapping sites of bladder: Secondary | ICD-10-CM

## 2022-02-28 LAB — URINALYSIS, ROUTINE W REFLEX MICROSCOPIC
Bilirubin, UA: NEGATIVE
Glucose, UA: NEGATIVE
Ketones, UA: NEGATIVE
Nitrite, UA: NEGATIVE
Protein,UA: NEGATIVE
Specific Gravity, UA: 1.025 (ref 1.005–1.030)
Urobilinogen, Ur: 0.2 mg/dL (ref 0.2–1.0)
pH, UA: 5.5 (ref 5.0–7.5)

## 2022-02-28 LAB — MICROSCOPIC EXAMINATION
Bacteria, UA: NONE SEEN
Epithelial Cells (non renal): NONE SEEN /hpf (ref 0–10)
Renal Epithel, UA: NONE SEEN /hpf

## 2022-02-28 MED ORDER — BCG LIVE 50 MG IS SUSR
3.2400 mL | Freq: Once | INTRAVESICAL | Status: AC
  Administered 2022-02-28: 81 mg via INTRAVESICAL

## 2022-02-28 NOTE — Patient Instructions (Signed)

## 2022-02-28 NOTE — Progress Notes (Signed)
Assessment: 1. Malignant neoplasm of overlapping sites of bladder [facility])     Plan: Follow-up next week for treatment 4 of 6 as scheduled.  Chief Complaint: No chief complaint on file.   HPI: 02/28/22 Kyle Owen is a 81 y.o. male who presents for continued evaluation of bladder cancer with BCG treatment 3 of 6 scheduled for today.  He reports tolerating treatments without difficulty.  For urinary frequency symptoms, he continues samples of Myrbetriq 50 mg and is tolerating this well.  He denies pain, gross hematuria today. Urinalysis reveals 0-5 WBCs and 3-10 RBCs.  4.5.20221-Underwent TURBT/Rt URS/stone mgmt.   Initial TURBT of a 2 cm papillary bladder tumor on 5.22.2017.  Pathology revealed papillary low-grade NMIBC. Epirubicin was placed post resection.   4.5.2021: He underwent cystoscopy, biopsy of papillary lesions at the right ureteral orifice (path--urothelial papilloma), right ureteroscopy with holmium laser and extraction of stone as well as stent placement.  He has since remove the stent.  He has passed multiple small fragments.   7.12.2022: Cystoscopy negative  12.13.2022: Here early for follow-up.  Had 3 to 4 days of total gross painless hematuria which cleared up about a week ago.  No change in urinary pattern, no flank or abdominal symptoms at that time.  His CT scan for hematuria evaluation was negative.   1.30.2023: He underwent cystoscopy, bladder biopsy and TUR of papillary lesions within the prostatic urethra.  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  2.14.2023: He has urgency and frequency since his procedure but no current dysuria or gross hematuria.  His wife and daughter accompany him to the visit today.   Portions of the above documentation were copied from a prior visit for review purposes only.  Allergies: Allergies  Allergen Reactions   Flomax [Tamsulosin Hcl] Nausea And Vomiting  and Other (See Comments)    "Pt thought he was dying " DIZZY    PMH: Past Medical History:  Diagnosis Date   Arthritis    DJD right knee   Benign prostatic hypertrophy with urinary frequency    Bladder cancer ([place])    Complication of anesthesia    gets combative in recovery   COPD with emphysema ([facility])    Depression    Dyspnea    when walking   Dysrhythmia    a fib   History of colon polyps    History of kidney stones    History of loop recorder    loop recorder in place battery is dead has not had changed due to covid   History of pneumonia    hx Recurrent -- last bout Dec 2016 CAP-- per pt resolved   History of TIA (transient ischemic attack) no residual per pt   per neurologist note (dr Leta Baptist 11-08-2015) dx cryptogenic TIA versus arrhythia versus dysautonomia (right ophthalmic artery TIA and x3 posterior circulation TIAs   Hyperlipidemia    Hypertension    Multinodular thyroid    per pathology report 11-12-2014 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   Pulmonary nodule, left    left lower lobe x2 per CT 01-20-2016   Renal cyst, left    Stroke [facility])  TIA's   Thoracic aortic aneurysm without rupture    ascending -- ct chest 01/10/16  4.8cm   Wears hearing aid    bilateral-- wear at times    PSH: Past Surgical History:  Procedure Laterality Date   ANTERIOR CERVICAL DECOMP/DISCECTOMY FUSION N/A 04/03/2017   Procedure: Cervical three-four, Cervical four-five Anterior cervical decompression/discectomy/fusion;  Surgeon: Erline Levine, MD;  Location: [place];  Service: Neurosurgery;  Laterality: N/A;   CATARACT EXTRACTION W/ INTRAOCULAR LENS IMPLANT Right 2016   CATARACT EXTRACTION W/PHACO   12/16/2012   Procedure: CATARACT EXTRACTION PHACO AND INTRAOCULAR LENS PLACEMENT ([place]);  Surgeon: Tonny Branch, MD;  [facility];  Service: Ophthalmology;  Laterality: Left;  CDE:17.31   COLONOSCOPY  10/03/2011   Procedure: COLONOSCOPY;  Surgeon: Jamesetta So;  [facility];  Service: Gastroenterology;  Laterality: N/A;   CYSTOSCOPY/RETROGRADE/URETEROSCOPY/STONE EXTRACTION WITH BASKET Right 03/29/2020   Procedure: CYSTOSCOPY/RETROGRADE/URETEROSCOPY/STONE EXTRACTION WITH BASKET;  Surgeon: Franchot Gallo, MD;  [facility];  Service: Urology;  Laterality: Right;  1 HR   DECOMPRESSIOIN ULNAR NERVE AND CUBITAL TUNNEL RELEASE Left 07-14-2009   elbow   EP IMPLANTABLE DEVICE N/A 08/10/2015   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   HOLMIUM LASER APPLICATION Right 3/0/0762   Procedure: HOLMIUM LASER APPLICATION;  Surgeon: Franchot Gallo, MD;  [facility];  Service: Urology;  Laterality: Right;   INGUINAL HERNIA REPAIR Right 1990's   KNEE ARTHROSCOPY Right 2015   LEFT CUBITAL TUNNEL RELEASE     POSTERIOR LUMBAR FUSION  2014   X2   SHOULDER ARTHROSCOPY Left 1990's   TEE WITHOUT CARDIOVERSION N/A 07/27/2015   Procedure: TRANSESOPHAGEAL ECHOCARDIOGRAM (TEE);  Surgeon: Lelon Perla, MD;  Location: [facility] ENDOSCOPY;  Service: Cardiovascular;  Laterality: N/A;   normal LV function, ef 55-60%,  mild AR and MR, mild dilated ascending aorta (4.2cm),  mild to moderate atherosclerosis  descending aorta,  mild to moderate TR,  negative saline microcavitation study   THYROIDECTOMY Right 01/20/2015   Procedure: RIGHT THYROIDECTOMY;  Surgeon: Ascencion Dike, MD;  Location: [facility] OR;  Service: ENT;  Laterality: Right;   TONSILLECTOMY  as child   TOTAL HIP ARTHROPLASTY Left 12/14/2020   Procedure: TOTAL HIP ARTHROPLASTY ANTERIOR APPROACH;  Surgeon: [name], MD;  [facility];  Service: Orthopedics;  Laterality: Left;   TRANSURETHRAL RESECTION OF BLADDER TUMOR N/A  05/15/2016   Procedure: TRANSURETHRAL RESECTION OF BLADDER TUMOR (TURBT) AND INSTILLATION OF EPIRUBICIN;  Surgeon: Franchot Gallo, MD;  Location: [facility];  Service: Urology;  Laterality: N/A;   TRANSURETHRAL RESECTION OF BLADDER TUMOR N/A 03/29/2020   Procedure: TRANSURETHRAL RESECTION OF BLADDER TUMOR (TURBT);  Surgeon: Franchot Gallo, MD;  [facility];  Service: Urology;  Laterality: N/A;   TRANSURETHRAL RESECTION OF BLADDER TUMOR WITH MITOMYCIN-C N/A 01/23/2022   Procedure: TRANSURETHRAL RESECTION OF BLADDER TUMOR WITH POST OPERATIVE GEMCITABINE INSTILLATION;  Surgeon: Franchot Gallo, MD;  [facility];  Service: Urology;  Laterality: N/A;    SH: Social History   Tobacco Use   Smoking status: Former    Packs/day: 1.00    Years: 45.00    Pack years: 45.00    Types: Cigarettes    Quit date: 09/26/2010    Years since quitting: 11.4   Smokeless tobacco: Never  Vaping Use   Vaping Use: Never used  Substance Use Topics   Alcohol use: No   Drug use: Never    ROS: Constitutional:  Negative for fever,  chills, weight loss CV: Negative for chest pain Respiratory:  Negative for shortness of breath, wheezing, sleep apnea, frequent cough GI:  Negative for nausea, vomiting, bloody stool, GERD  PE: BP (!) 154/81    Pulse 68    Ht '6\' 1"'$  (1.854 m)    Wt 215 lb (97.5 kg)    BMI 28.37 kg/m  GENERAL APPEARANCE:  Well appearing, well developed, well nourished, NAD HEENT:  Atraumatic, normocephalic NECK:  Supple. Trachea midline ABDOMEN:  Soft, non-tender, no masses EXTREMITIES:  Moves all extremities well, without clubbing, cyanosis, or edema NEUROLOGIC:  Alert and oriented x 3, normal gait, CN II-XII grossly intact MENTAL STATUS:  appropriate BACK:  Non-tender to palpation, No CVAT SKIN:  Warm, dry, and intact   Results: Laboratory Data: Lab Results  Component Value Date   WBC 4.8 01/12/2022   HGB 14.4 01/12/2022   HCT 45.8 01/12/2022   MCV 100.9  (H) 01/12/2022   PLT 171 01/12/2022    Lab Results  Component Value Date   CREATININE 1.43 (H) 01/12/2022    No results found for: PSA  No results found for: TESTOSTERONE  Lab Results  Component Value Date   HGBA1C 6.0 (H) 06/09/2015    Urinalysis    Component Value Date/Time   COLORURINE YELLOW 06/13/2016 1120   APPEARANCEUR Clear 02/23/2022 0856   LABSPEC 1.020 06/13/2016 1120   PHURINE 5.5 06/13/2016 1120   GLUCOSEU Negative 02/23/2022 0856   HGBUR NEGATIVE 06/13/2016 1120   BILIRUBINUR Negative 02/23/2022 0856   KETONESUR NEGATIVE 06/13/2016 1120   PROTEINUR Trace (A) 02/23/2022 0856   PROTEINUR NEGATIVE 06/13/2016 1120   UROBILINOGEN negative (A) 05/04/2020 0930   NITRITE Negative 02/23/2022 0856   NITRITE NEGATIVE 06/13/2016 1120   LEUKOCYTESUR 1+ (A) 02/23/2022 0856    Lab Results  Component Value Date   LABMICR See below: 02/23/2022   WBCUA 11-30 (A) 02/23/2022   LABEPIT 0-10 02/23/2022   MUCUS Present 02/07/2022   BACTERIA Few (A) 02/23/2022    Pertinent Imaging:  No results found for this or any previous visit.  No results found for this or any previous visit.  No results found for this or any previous visit.  No results found for this or any previous visit.  Results for orders placed during the hospital encounter of 05/11/20  US RENAL  Narrative CLINICAL DATA:  Calculus of kidney. Additional history provided: Prior TURP, right stone extraction April 2021.  EXAM: RENAL / URINARY TRACT ULTRASOUND COMPLETE  COMPARISON:  CT abdomen/pelvis 02/27/2020.  FINDINGS: Right Kidney:  Renal measurements: 10.6 x 4.8 x 4.9 cm = volume: 130.3 mL. There is renal cortical thinning. No mass or hydronephrosis visualized. No appreciable renal calculus. Specifically, the 1-2 mm upper pole calculus demonstrated on prior CT 02/27/2020 is not sonographically appreciable.  Left Kidney:  Renal measurements: 11.5 x 6.5 x 6.9 cm = volume: 271.4 mL. There  is renal cortical thinning. No mass or hydronephrosis visualized. No appreciable renal calculus. 3.0 x 2.3 x 3.1 cm simple appear interpolar cyst. 1.6 x 1.6 x 1.6 cm simple appearing upper pole renal cyst.  Bladder:  Appears normal for degree of bladder distention. Bilateral ureteral jets are demonstrated.  IMPRESSION: No hydronephrosis.  No sonographically appreciable renal calculi.  Bilateral renal cortical thinning suggestive of chronic renal parenchymal disease.  Simple appearing left renal cysts.   Electronically Signed By: Kellie Simmering DO On: 05/11/2020 10:24  No results found for this or any previous visit.  No results  found for this or any previous visit.  No results found for this or any previous visit.  No results found for this or any previous visit (from the past 24 hour(s)).

## 2022-02-28 NOTE — Progress Notes (Signed)
BCG Bladder Instillation ? ?BCG # 3 of 6 ? ?Due to Bladder Cancer patient is present today for a BCG treatment. Patient was cleaned and prepped in a sterile fashion with betadine. A 14FR catheter was inserted, urine return was noted 25m, urine was yellow in color.  55mof reconstituted BCG was instilled into the bladder. The catheter was then removed. Patient tolerated well, no complications were noted ? ?Performed by: KoLevi AlandMA ? ?Follow up/ Additional notes:  ?  ?

## 2022-03-07 ENCOUNTER — Ambulatory Visit (INDEPENDENT_AMBULATORY_CARE_PROVIDER_SITE_OTHER): Payer: Medicare Other

## 2022-03-07 ENCOUNTER — Other Ambulatory Visit: Payer: Self-pay

## 2022-03-07 DIAGNOSIS — C678 Malignant neoplasm of overlapping sites of bladder: Secondary | ICD-10-CM

## 2022-03-07 MED ORDER — BCG LIVE 50 MG IS SUSR
3.2400 mL | Freq: Once | INTRAVESICAL | Status: AC
  Administered 2022-03-07: 81 mg via INTRAVESICAL

## 2022-03-07 NOTE — Progress Notes (Deleted)
BCG Bladder Instillation ? ?BCG # 3 of 6 ? ?Due to Bladder Cancer patient is present today for a BCG treatment. Patient was cleaned and prepped in a sterile fashion with betadine. A ***FR catheter was inserted, urine return was noted ***ml, urine was *** in color.  23m of reconstituted BCG was instilled into the bladder. The catheter was then removed. Patient tolerated well, {dnt complications:20057} ? ?Performed by: *** ? ?Follow up/ Additional notes: *** ? ?

## 2022-03-07 NOTE — Progress Notes (Signed)
BCG Bladder Instillation ? ?BCG # 4 of 6 ? ?Due to Bladder Cancer patient is present today for a BCG treatment. Patient was cleaned and prepped in a sterile fashion with betadine. A 14FR catheter was inserted, urine return was noted 79m, urine was 20 in color.  562mof reconstituted BCG was instilled into the bladder. The catheter was then removed. Patient tolerated well, no complications were noted ? ?Performed by: AmEstill BambergN ? ?Follow up/ Additional notes: 1 week BCG ? ? ?

## 2022-03-08 LAB — URINALYSIS, ROUTINE W REFLEX MICROSCOPIC
Bilirubin, UA: NEGATIVE
Glucose, UA: NEGATIVE
Ketones, UA: NEGATIVE
Leukocytes,UA: NEGATIVE
Nitrite, UA: NEGATIVE
Protein,UA: NEGATIVE
RBC, UA: NEGATIVE
Specific Gravity, UA: >1.03 — ABNORMAL HIGH (ref 1.005–1.030)
Urobilinogen, Ur: 0.2 mg/dL (ref 0.2–1.0)
pH, UA: 5.5 (ref 5.0–7.5)

## 2022-03-14 ENCOUNTER — Ambulatory Visit (INDEPENDENT_AMBULATORY_CARE_PROVIDER_SITE_OTHER): Payer: Medicare Other

## 2022-03-14 ENCOUNTER — Other Ambulatory Visit: Payer: Self-pay

## 2022-03-14 DIAGNOSIS — C678 Malignant neoplasm of overlapping sites of bladder: Secondary | ICD-10-CM | POA: Diagnosis not present

## 2022-03-14 LAB — URINALYSIS, ROUTINE W REFLEX MICROSCOPIC
Bilirubin, UA: NEGATIVE
Glucose, UA: NEGATIVE
Ketones, UA: NEGATIVE
Leukocytes,UA: NEGATIVE
Nitrite, UA: NEGATIVE
Protein,UA: NEGATIVE
RBC, UA: NEGATIVE
Specific Gravity, UA: 1.025 (ref 1.005–1.030)
Urobilinogen, Ur: 0.2 mg/dL (ref 0.2–1.0)
pH, UA: 5.5 (ref 5.0–7.5)

## 2022-03-14 MED ORDER — BCG LIVE 50 MG IS SUSR
3.2400 mL | Freq: Once | INTRAVESICAL | Status: AC
  Administered 2022-03-14: 81 mg via INTRAVESICAL

## 2022-03-14 NOTE — Patient Instructions (Signed)

## 2022-03-14 NOTE — Progress Notes (Signed)
BCG Bladder Instillation ? ?BCG # 5 of 6 ? ?Due to Bladder Cancer patient is present today for a BCG treatment. Patient was cleaned and prepped in a sterile fashion with betadine. A 14FR catheter was inserted, urine return was noted 42m, urine was yellow in color.  576mof reconstituted BCG was instilled into the bladder. The catheter was then removed. Patient tolerated well, no complications were noted ? ?Performed by: Javonna Balli LPN ? ?Follow up/ Additional notes: per MD note ? ?

## 2022-03-21 ENCOUNTER — Ambulatory Visit (INDEPENDENT_AMBULATORY_CARE_PROVIDER_SITE_OTHER): Payer: Medicare Other

## 2022-03-21 ENCOUNTER — Other Ambulatory Visit: Payer: Self-pay

## 2022-03-21 DIAGNOSIS — C678 Malignant neoplasm of overlapping sites of bladder: Secondary | ICD-10-CM

## 2022-03-21 MED ORDER — BCG LIVE 50 MG IS SUSR
3.2400 mL | Freq: Once | INTRAVESICAL | Status: AC
  Administered 2022-03-21: 81 mg via INTRAVESICAL

## 2022-03-21 NOTE — Progress Notes (Signed)
BCG Bladder Instillation ? ?BCG # 6 of 6 ? ?Due to Bladder Cancer patient is present today for a BCG treatment. Patient was cleaned and prepped in a sterile fashion with betadine. A 14FR catheter was inserted, urine return was noted 24m, urine was yellow in color.  512mof reconstituted BCG was instilled into the bladder. The catheter was then removed. Patient tolerated well, no complications were noted ? ?Performed by: KoLevi AlandCMA ? ?Follow up/ Additional notes: Keep next apt with MD. ?  ?

## 2022-03-21 NOTE — Patient Instructions (Signed)

## 2022-03-22 LAB — URINALYSIS, ROUTINE W REFLEX MICROSCOPIC
Bilirubin, UA: NEGATIVE
Glucose, UA: NEGATIVE
Ketones, UA: NEGATIVE
Nitrite, UA: NEGATIVE
Protein,UA: NEGATIVE
RBC, UA: NEGATIVE
Specific Gravity, UA: 1.025 (ref 1.005–1.030)
Urobilinogen, Ur: 0.2 mg/dL (ref 0.2–1.0)
pH, UA: 5.5 (ref 5.0–7.5)

## 2022-03-22 LAB — MICROSCOPIC EXAMINATION
Bacteria, UA: NONE SEEN
RBC, Urine: NONE SEEN /hpf (ref 0–2)

## 2022-03-30 ENCOUNTER — Other Ambulatory Visit: Payer: Self-pay | Admitting: Cardiothoracic Surgery

## 2022-03-30 DIAGNOSIS — I7121 Aneurysm of the ascending aorta, without rupture: Secondary | ICD-10-CM

## 2022-05-01 NOTE — Progress Notes (Signed)
History of Present Illness:  ? ?5.22.2017:Initial TURBT of a 2 cm papillary bladder tumor.  ?Pathology revealed papillary low-grade NMIBC. Epirubicin was placed post resection.  ?   ?4.5.2021: He underwent cystoscopy, biopsy of papillary lesions at the right ureteral orifice (path--urothelial papilloma), right ureteroscopy with holmium laser and extraction of stone as well as stent placement.  He has since remove the stent.  He has passed multiple small fragments. ?  ?7.12.2022: Cystoscopy negative ? ?12.13.2022: Here early for follow-up.  Had 3 to 4 days of total gross painless hematuria which cleared up about a week ago.  No change in urinary pattern, no flank or abdominal symptoms at that time.  His CT scan for hematuria evaluation was negative. Cysto--recurrent TCCa. ?  ?1.30.2023: He underwent cystoscopy, bladder biopsy and TUR of papillary lesions within the prostatic urethra.  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 ? ?3.21.2023: Completed BCG induction. ? ?5.9.2023: Here for cysto. ? ?Past Medical History:  ?Diagnosis Date  ? Arthritis   ? DJD right knee  ? Benign prostatic hypertrophy with urinary frequency   ? Bladder cancer ([place])   ? Complication of anesthesia   ? gets combative in recovery  ? COPD with emphysema ([place])   ? Depression   ? Dyspnea   ? when walking  ? Dysrhythmia   ? a fib  ? History of colon polyps   ? History of kidney stones   ? History of loop recorder   ? loop recorder in place battery is dead has not had changed due to covid  ? History of pneumonia   ? hx Recurrent -- last bout Dec 2016 CAP-- per pt resolved  ? History of TIA (transient ischemic attack) no residual per pt  ? per neurologist note (dr Leta Baptist 11-08-2015) dx cryptogenic TIA versus arrhythia versus dysautonomia (right ophthalmic artery TIA and x3 posterior circulation TIAs  ? Hyperlipidemia   ? Hypertension   ? Multinodular thyroid   ? per pathology  report 11-12-2014 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   ? Nephrolithiasis   ? right non-obstructive  per CT 05-02-2016  ? Ocular migraine   ? controlled w/ verapamil  ? PAF (paroxysmal atrial fibrillation) ([place])   ? followed by AFIB clinic-- cardiologist-- dr Marlou Porch  ? Pneumonia   ? history of pnu.  ? Pulmonary nodule, left   ? left lower lobe x2 per CT 01-20-2016  ? Renal cyst, left   ? Stroke [facility])   ? TIA's  ? Thoracic aortic aneurysm without rupture   ? ascending -- ct chest 01/10/16  4.8cm  ? Wears hearing aid   ? bilateral-- wear at times  ? ? ?Past Surgical History:  ?Procedure Laterality Date  ? ANTERIOR CERVICAL DECOMP/DISCECTOMY FUSION N/A 04/03/2017  ? Procedure: Cervical three-four, Cervical four-five Anterior cervical decompression/discectomy/fusion;  Surgeon: Erline Levine, MD;  Location: [place];  Service: Neurosurgery;  Laterality: N/A;  ? CATARACT EXTRACTION W/ INTRAOCULAR LENS IMPLANT Right 2016  ? CATARACT EXTRACTION W/PHACO  12/16/2012  ? Procedure: CATARACT EXTRACTION PHACO AND INTRAOCULAR LENS PLACEMENT (IOC);  Surgeon: Tonny Branch, MD;  [facility];  Service: Ophthalmology;  Laterality: Left;  CDE:17.31  ? COLONOSCOPY  10/03/2011  ? Procedure: COLONOSCOPY;  Surgeon: Jamesetta So;  [facility];  Service: Gastroenterology;  Laterality: N/A;  ? CYSTOSCOPY/RETROGRADE/URETEROSCOPY/STONE EXTRACTION WITH BASKET Right 03/29/2020  ? Procedure: CYSTOSCOPY/RETROGRADE/URETEROSCOPY/STONE EXTRACTION WITH BASKET;  Surgeon: Franchot Gallo, MD;  Location:  [facility];  Service: Urology;  Laterality: Right;  1 HR  ? DECOMPRESSIOIN ULNAR NERVE AND CUBITAL TUNNEL RELEASE Left 07-14-2009  ? elbow  ? EP IMPLANTABLE DEVICE N/A 08/10/2015  ? 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 HOLMIUM LASER APPLICATION Right 05/29/9934  ? Procedure: HOLMIUM LASER APPLICATION;  Surgeon: Franchot Gallo, MD;  Location: [facility];  Service: Urology;  Laterality: Right;  ?  INGUINAL HERNIA REPAIR Right 1990's  ? KNEE ARTHROSCOPY Right 2015  ? LEFT CUBITAL TUNNEL RELEASE    ? POSTERIOR LUMBAR FUSION  2014  ? X2  ? SHOULDER ARTHROSCOPY Left 1990's  ? TEE WITHOUT CARDIOVERSION N/A 07/27/2015  ? Procedure: TRANSESOPHAGEAL ECHOCARDIOGRAM (TEE);  Surgeon: Lelon Perla, MD;  Location: [facility] ENDOSCOPY;  Service: Cardiovascular;  Laterality: N/A;   normal LV function, ef 55-60%,  mild AR and MR, mild dilated ascending aorta (4.2cm),  mild to moderate atherosclerosis  descending aorta,  mild to moderate TR,  negative saline microcavitation study  ? THYROIDECTOMY Right 01/20/2015  ? Procedure: RIGHT THYROIDECTOMY;  Surgeon: Ascencion Dike, MD;  Location: [place];  Service: ENT;  Laterality: Right;  ? TONSILLECTOMY  as child  ? TOTAL HIP ARTHROPLASTY Left 12/14/2020  ? Procedure: TOTAL HIP ARTHROPLASTY ANTERIOR APPROACH;  Surgeon: [name], MD;  [facility];  Service: Orthopedics;  Laterality: Left;  ? TRANSURETHRAL RESECTION OF BLADDER TUMOR N/A 05/15/2016  ? Procedure: TRANSURETHRAL RESECTION OF BLADDER TUMOR (TURBT) AND INSTILLATION OF EPIRUBICIN;  Surgeon: Franchot Gallo, MD;  Location: [facility];  Service: Urology;  Laterality: N/A;  ? TRANSURETHRAL RESECTION OF BLADDER TUMOR N/A 03/29/2020  ? Procedure: TRANSURETHRAL RESECTION OF BLADDER TUMOR (TURBT);  Surgeon: Franchot Gallo, MD;  [facility];  Service: Urology;  Laterality: N/A;  ? TRANSURETHRAL RESECTION OF BLADDER TUMOR WITH MITOMYCIN-C N/A 01/23/2022  ? Procedure: TRANSURETHRAL RESECTION OF BLADDER TUMOR WITH POST OPERATIVE GEMCITABINE INSTILLATION;  Surgeon: Franchot Gallo, MD;  [facility];  Service: Urology;  Laterality: N/A;  ? ? ?Home Medications:  ?Allergies as of 05/02/2022   ? ?   Reactions  ? Flomax [tamsulosin Hcl] Nausea And Vomiting, Other (See Comments)  ? "Pt thought he was dying " ?DIZZY  ? ?  ? ?  ?Medication List  ?  ? ?  ? Accurate as of May 01, 2022  7:39 PM. If you have any  questions, ask your nurse or doctor.  ?  ?  ? ?  ? ?acetaminophen 500 MG tablet ?Commonly known as: TYLENOL ?Take 2 tablets (1,000 mg total) by mouth every 6 (six) hours. ?What changed:  ?how much to take ?when to take this ?  ?atorvastatin 20 MG tablet ?Commonly known as: LIPITOR ?TAKE 1 TABLET BY MOUTH DAILY. ?  ?Breztri Aerosphere 160-9-4.8 MCG/ACT Aero ?Generic drug: Budeson-Glycopyrrol-Formoterol ?INHALE (2) PUFFS INTO THE LUNGS IN THE MORNING AND AT BEDTIME ?  ?finasteride 5 MG tablet ?Commonly known as: PROSCAR ?Take 5 mg by mouth daily. ?  ?loratadine 10 MG tablet ?Commonly known as: CLARITIN ?Take 10 mg by mouth daily. ?  ?Magnesium 500 MG Tabs ?Take 500 mg by mouth daily. ?  ?sertraline 50 MG tablet ?Commonly known as: ZOLOFT ?Take 50 mg by mouth every morning. ?  ?Ventolin HFA 108 (90 Base) MCG/ACT inhaler ?Generic drug: albuterol ?Inhale 2 puffs into the lungs Every 4 hours as needed for wheezing or shortness of breath. ?  ?verapamil 240 MG CR tablet ?Commonly known as: CALAN-SR ?Take  240 mg by mouth daily. ?  ?Vitamin D 50 MCG (2000 UT) tablet ?Take 2,000 Units by mouth daily. ?  ? ?  ? ? ?Allergies:  ?Allergies  ?Allergen Reactions  ? Flomax [Tamsulosin Hcl] Nausea And Vomiting and Other (See Comments)  ?  "Pt thought he was dying " ?DIZZY  ? ? ?Family History  ?Problem Relation Age of Onset  ? Stroke Sister   ? Breast cancer Sister   ? Stroke Brother   ? ? ?Social History:  reports that he quit smoking about 11 years ago. His smoking use included cigarettes. He has a 45.00 pack-year smoking history. He has never used smokeless tobacco. He reports that he does not drink alcohol and does not use drugs. ? ?ROS: ?A complete review of systems was performed.  All systems are negative except for pertinent findings as noted. ? ?Physical Exam:  ?Vital signs in last 24 hours: ?There were no vitals taken for this visit. ?Constitutional:  Alert and oriented, No acute distress ?Cardiovascular: Regular rate   ?Respiratory: Normal respiratory effort ?GI: Abdomen is soft, nontender, nondistended, no abdominal masses. No CVAT.  ?Genitourinary: Normal male phallus, testes are descended bilaterally and non-tender and without

## 2022-05-02 ENCOUNTER — Ambulatory Visit (INDEPENDENT_AMBULATORY_CARE_PROVIDER_SITE_OTHER): Payer: Medicare Other | Admitting: Urology

## 2022-05-02 ENCOUNTER — Encounter: Payer: Self-pay | Admitting: Urology

## 2022-05-02 VITALS — BP 124/76 | HR 72

## 2022-05-02 DIAGNOSIS — N200 Calculus of kidney: Secondary | ICD-10-CM

## 2022-05-02 DIAGNOSIS — C678 Malignant neoplasm of overlapping sites of bladder: Secondary | ICD-10-CM | POA: Diagnosis not present

## 2022-05-02 LAB — URINALYSIS, ROUTINE W REFLEX MICROSCOPIC
Bilirubin, UA: NEGATIVE
Glucose, UA: NEGATIVE
Ketones, UA: NEGATIVE
Leukocytes,UA: NEGATIVE
Nitrite, UA: NEGATIVE
Protein,UA: NEGATIVE
RBC, UA: NEGATIVE
Specific Gravity, UA: 1.025 (ref 1.005–1.030)
Urobilinogen, Ur: 0.2 mg/dL (ref 0.2–1.0)
pH, UA: 5.5 (ref 5.0–7.5)

## 2022-05-02 MED ORDER — CIPROFLOXACIN HCL 500 MG PO TABS
500.0000 mg | ORAL_TABLET | Freq: Once | ORAL | Status: AC
  Administered 2022-05-02: 500 mg via ORAL

## 2022-05-03 LAB — CYTOLOGY, URINE

## 2022-05-15 ENCOUNTER — Other Ambulatory Visit: Payer: Self-pay | Admitting: Cardiothoracic Surgery

## 2022-05-15 ENCOUNTER — Encounter: Payer: Self-pay | Admitting: Cardiothoracic Surgery

## 2022-05-15 ENCOUNTER — Ambulatory Visit (INDEPENDENT_AMBULATORY_CARE_PROVIDER_SITE_OTHER): Payer: Medicare Other | Admitting: Cardiothoracic Surgery

## 2022-05-15 ENCOUNTER — Ambulatory Visit
Admission: RE | Admit: 2022-05-15 | Discharge: 2022-05-15 | Disposition: A | Discharge status: Home / Self Care | Payer: Medicare Other | Source: Ambulatory Visit | Attending: Cardiothoracic Surgery | Admitting: Cardiothoracic Surgery

## 2022-05-15 VITALS — BP 123/71 | HR 61 | Resp 20 | Ht 73.0 in | Wt 202.0 lb

## 2022-05-15 DIAGNOSIS — I7121 Aneurysm of the ascending aorta, without rupture: Secondary | ICD-10-CM

## 2022-05-15 DIAGNOSIS — I779 Disorder of arteries and arterioles, unspecified: Secondary | ICD-10-CM | POA: Diagnosis not present

## 2022-05-15 MED ORDER — IOPAMIDOL (ISOVUE-370) INJECTION 76%
50.0000 mL | Freq: Once | INTRAVENOUS | Status: AC | PRN
  Administered 2022-05-15: 50 mL via INTRAVENOUS

## 2022-05-15 NOTE — Progress Notes (Signed)
HPI: Patient returns for scheduled surveillance CTA of thoracic aorta for a known asymptomatic 4.7 cm fusiform aneurysm first noted in 2016.  It is remained stable in size.  He remains asymptomatic.  He has no cardiac symptoms.  I reviewed the images of the scan today showing a stable 4.7 cm fusiform ascending aneurysm.  There is no evidence of intramural hematoma or intimal ulceration.  Lung windows show no at risk pulmonary nodules or adenopathy.  Patient has no history of hypertension.  He is currently being treated by Dr. Diona Fanti for treatment of bladder tumors.  He still has left hip pain requiring a cane to walk after left hip replacement year and half ago.  Patient remains on Eliquis and verapamil for paroxysmal atrial fibrillation.  Current Outpatient Medications  Medication Sig Dispense Refill   acetaminophen (TYLENOL) 500 MG tablet Take 2 tablets (1,000 mg total) by mouth every 6 (six) hours. (Patient taking differently: Take 500-1,000 mg by mouth daily.) 30 tablet 0   atorvastatin (LIPITOR) 20 MG tablet TAKE 1 TABLET BY MOUTH DAILY. 90 tablet 2   BREZTRI AEROSPHERE 160-9-4.8 MCG/ACT AERO INHALE (2) PUFFS INTO THE LUNGS IN THE MORNING AND AT BEDTIME 10.7 g 0   Cholecalciferol (VITAMIN D) 50 MCG (2000 UT) tablet Take 2,000 Units by mouth daily.     ELIQUIS 5 MG TABS tablet Take 5 mg by mouth 2 (two) times daily.     finasteride (PROSCAR) 5 MG tablet Take 5 mg by mouth daily.      loratadine (CLARITIN) 10 MG tablet Take 10 mg by mouth daily.     Magnesium 500 MG TABS Take 500 mg by mouth daily.      sertraline (ZOLOFT) 50 MG tablet Take 50 mg by mouth every morning.     VENTOLIN HFA 108 (90 BASE) MCG/ACT inhaler Inhale 2 puffs into the lungs Every 4 hours as needed for wheezing or shortness of breath.      verapamil (CALAN-SR) 240 MG CR tablet Take 240 mg by mouth daily.     No current facility-administered medications for this visit.     Physical Exam: Blood pressure 123/71,  pulse 61, resp. rate 20, height '6\' 1"'$  (1.854 m), weight 202 lb (91.6 kg), SpO2 92 %.        Exam    General- alert and comfortable    Neck- no JVD, no cervical adenopathy palpable, no carotid bruit   Lungs- clear without rales, wheezes   Cor- regular rate and rhythm, no murmur , gallop   Abdomen- soft, non-tender   Extremities - warm, non-tender, minimal edema   Neuro- oriented, appropriate, no focal weakness  Diagnostic Tests:  Images of today CTA personally reviewed showing a stable 4.7 cm fusiform ascending aneurysmmas noted above.  Impression: [name] ascending aneurysm being stable at 4.7.cm diameter.  Risk for tear/dissection is less than 1%.  He is a poor candidate for surgery at age 81 with bladder cancer.  Best therapy is maintaining good blood pressure control.  We will continue f with surveillance scans and reschedule in 18 months since he is not a surgical candidate but can use this information for planning prognosis.  Plan: Return for surveillance CTof thoracic aorta in 18 months   Dahlia Byes, MD [facility] [phone]

## 2022-05-16 ENCOUNTER — Encounter: Payer: Self-pay | Admitting: Pulmonary Disease

## 2022-05-16 ENCOUNTER — Ambulatory Visit: Payer: Medicare Other | Admitting: Pulmonary Disease

## 2022-05-16 DIAGNOSIS — J449 Chronic obstructive pulmonary disease, unspecified: Secondary | ICD-10-CM | POA: Diagnosis not present

## 2022-05-16 DIAGNOSIS — I7121 Aneurysm of the ascending aorta, without rupture: Secondary | ICD-10-CM | POA: Diagnosis not present

## 2022-05-16 NOTE — Progress Notes (Signed)
   Subjective:    Patient ID: Kyle Owen, male    DOB: 1941-02-22, 81 y.o.   MRN: [id number]  HPI  81 yo ex-smoker for follow-up of COPD and chronic hypoxic respiratory failure -Started on nocturnal oxygen 03/2021   PMH -cryptogenic TIA, atrial fibrillation on Eliquis, bladder cancer , thoracic aortic aneurysm  Chief Complaint  Patient presents with   Follow-up    Breathing at Cardiology office yesterday O2 sats were at 89%   He arrives ambulating with a cane, accompanied by his wife Kyle Owen who corroborates history.  He reports dyspnea on walking up an incline or walking to the mailbox.  He completed physical therapy. He is compliant with nocturnal oxygen and although he states this did not make a difference, his wife states that his headaches are resolved and he has more energy. He is still falling asleep in the daytime.  He sleeps for about 2 hours at a time. He is acting out his dreams to the point where wife has had to move to a different room Reviewed CT chest from yesterday   Significant tests/ events reviewed  03/2021 ONO shows desaturation less than 88% for 3 hours 46 minutes >> 2 L during sleep  CTA chest 04/2022 stable aneurysm  CT angiogram chest 10/2020 -stable 4.7 cm aneurysm ?  Left basal bronchiectasis   CT chest 08/2019  4.7 cm ascending aortic aneurysm .  Stable calcified granuloma right apex    PFTs 10/2020 moderate airway obstruction, ratio 62, FEV1 1.83/58%, FVC 67%, TLC normal, DLCO 22.8/88%  Review of Systems neg for any significant sore throat, dysphagia, itching, sneezing, nasal congestion or excess/ purulent secretions, fever, chills, sweats, unintended wt loss, pleuritic or exertional cp, hempoptysis, orthopnea pnd or change in chronic leg swelling. Also denies presyncope, palpitations, heartburn, abdominal pain, nausea, vomiting, diarrhea or change in bowel or urinary habits, dysuria,hematuria, rash, arthralgias, visual complaints, headache, numbness  weakness or ataxia.     Objective:   Physical Exam   Gen. 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  Trial of melatonin OTC

## 2022-05-16 NOTE — Assessment & Plan Note (Signed)
Stable at 4.7 cm

## 2022-05-16 NOTE — Assessment & Plan Note (Signed)
Continue Breztri. Use albuterol for rescue. I offered him pulmonary rehab but he does not want to enroll.  They have access to a gym and will try doing that

## 2022-05-16 NOTE — Patient Instructions (Addendum)
  X Trial of melatonin 5-10 mg OTC 1 hr before bedtime  X Ambulatory sat

## 2022-06-07 ENCOUNTER — Ambulatory Visit (INDEPENDENT_AMBULATORY_CARE_PROVIDER_SITE_OTHER): Payer: Medicare Other | Admitting: Physician Assistant

## 2022-06-07 DIAGNOSIS — C678 Malignant neoplasm of overlapping sites of bladder: Secondary | ICD-10-CM

## 2022-06-07 LAB — URINALYSIS, ROUTINE W REFLEX MICROSCOPIC
Bilirubin, UA: NEGATIVE
Glucose, UA: NEGATIVE
Ketones, UA: NEGATIVE
Leukocytes,UA: NEGATIVE
Nitrite, UA: NEGATIVE
Protein,UA: NEGATIVE
RBC, UA: NEGATIVE
Specific Gravity, UA: 1.025 (ref 1.005–1.030)
Urobilinogen, Ur: 0.2 mg/dL (ref 0.2–1.0)
pH, UA: 5 (ref 5.0–7.5)

## 2022-06-07 MED ORDER — BCG LIVE 50 MG IS SUSR
3.2400 mL | Freq: Once | INTRAVESICAL | Status: AC
  Administered 2022-06-07: 81 mg via INTRAVESICAL

## 2022-06-07 NOTE — Progress Notes (Signed)
BCG Bladder Instillation  BCG # 1 of  3  Due to Bladder Cancer patient is present today for a BCG treatment. Patient was cleaned and prepped in a sterile fashion with betadine. A 14FR catheter was inserted, urine return was noted 60m, urine was yellow in color.  521mof reconstituted BCG was instilled into the bladder. The catheter was then removed. Patient tolerated well, no complications were noted  Performed by: KoLevi AlandCMA  Follow up/ Additional notes: Follow up as scheduled.

## 2022-06-07 NOTE — Patient Instructions (Signed)

## 2022-06-14 ENCOUNTER — Ambulatory Visit (INDEPENDENT_AMBULATORY_CARE_PROVIDER_SITE_OTHER): Payer: Medicare Other | Admitting: Physician Assistant

## 2022-06-14 DIAGNOSIS — C678 Malignant neoplasm of overlapping sites of bladder: Secondary | ICD-10-CM | POA: Diagnosis not present

## 2022-06-14 LAB — URINALYSIS, ROUTINE W REFLEX MICROSCOPIC
Bilirubin, UA: NEGATIVE
Glucose, UA: NEGATIVE
Ketones, UA: NEGATIVE
Leukocytes,UA: NEGATIVE
Nitrite, UA: NEGATIVE
Protein,UA: NEGATIVE
RBC, UA: NEGATIVE
Specific Gravity, UA: 1.015 (ref 1.005–1.030)
Urobilinogen, Ur: 0.2 mg/dL (ref 0.2–1.0)
pH, UA: 5.5 (ref 5.0–7.5)

## 2022-06-14 MED ORDER — BCG LIVE 50 MG IS SUSR
3.2400 mL | Freq: Once | INTRAVESICAL | Status: AC
  Administered 2022-06-14: 81 mg via INTRAVESICAL

## 2022-06-14 NOTE — Patient Instructions (Signed)

## 2022-06-14 NOTE — Progress Notes (Signed)
BCG Bladder Instillation  BCG # 2 of 3  Due to Bladder Cancer patient is present today for a BCG treatment. Patient was cleaned and prepped in a sterile fashion with betadine. A 14FR catheter was inserted, urine return was noted 83m, urine was yellow in color.  524mof reconstituted BCG was instilled into the bladder. The catheter was then removed. Patient tolerated well, no complications were noted  Performed by: KoLevi AlandCMA  Follow up/ Additional notes: Follow up as scheduled.

## 2022-06-17 ENCOUNTER — Other Ambulatory Visit: Payer: Self-pay | Admitting: Pulmonary Disease

## 2022-06-19 ENCOUNTER — Other Ambulatory Visit: Payer: Self-pay | Admitting: Pulmonary Disease

## 2022-06-19 ENCOUNTER — Telehealth: Payer: Self-pay | Admitting: Pulmonary Disease

## 2022-06-19 MED ORDER — BREZTRI AEROSPHERE 160-9-4.8 MCG/ACT IN AERO: INHALATION_SPRAY | RESPIRATORY_TRACT | 0 refills | Status: DC

## 2022-06-21 ENCOUNTER — Ambulatory Visit (INDEPENDENT_AMBULATORY_CARE_PROVIDER_SITE_OTHER): Payer: Medicare Other | Admitting: Physician Assistant

## 2022-06-21 DIAGNOSIS — C678 Malignant neoplasm of overlapping sites of bladder: Secondary | ICD-10-CM | POA: Diagnosis not present

## 2022-06-21 LAB — URINALYSIS, ROUTINE W REFLEX MICROSCOPIC
Bilirubin, UA: NEGATIVE
Glucose, UA: NEGATIVE
Ketones, UA: NEGATIVE
Leukocytes,UA: NEGATIVE
Nitrite, UA: NEGATIVE
Protein,UA: NEGATIVE
RBC, UA: NEGATIVE
Specific Gravity, UA: 1.02 (ref 1.005–1.030)
Urobilinogen, Ur: 0.2 mg/dL (ref 0.2–1.0)
pH, UA: 5.5 (ref 5.0–7.5)

## 2022-06-21 MED ORDER — BCG LIVE 50 MG IS SUSR
3.2400 mL | Freq: Once | INTRAVESICAL | Status: AC
  Administered 2022-06-21: 81 mg via INTRAVESICAL

## 2022-06-21 NOTE — Progress Notes (Signed)
BCG Bladder Instillation  BCG # 3 of 3  Due to Bladder Cancer patient is present today for a BCG treatment. Patient was cleaned and prepped in a sterile fashion with betadine. A 14FR catheter was inserted, urine return was noted 14m, urine was yellow in color.  554mof reconstituted BCG was instilled into the bladder. The catheter was then removed. Patient tolerated well, no complications were noted  Performed by: KoLevi AlandCMA  Follow up/ Additional notes: Follow up with MD

## 2022-07-04 ENCOUNTER — Other Ambulatory Visit: Payer: Medicare Other | Admitting: Urology

## 2022-07-12 ENCOUNTER — Telehealth: Payer: Self-pay | Admitting: Pulmonary Disease

## 2022-07-12 MED ORDER — BREZTRI AEROSPHERE 160-9-4.8 MCG/ACT IN AERO: INHALATION_SPRAY | RESPIRATORY_TRACT | 3 refills | Status: DC

## 2022-07-12 NOTE — Telephone Encounter (Signed)
Called and spoke to [place] and she confirms patient is asking for a 90 day supply of Breztri to [facility].   Order sent while on the phone with patients wife. She was notified. Nothing further needed.

## 2022-08-07 NOTE — Progress Notes (Signed)
History of Present Illness:   4.5.20221-Underwent TURBT/Rt URS/stone mgmt.   Initial TURBT of a 2 cm papillary bladder tumor on 5.22.2017.  Pathology revealed papillary low-grade NMIBC. Epirubicin was placed post resection.   4.5.2021: He underwent cystoscopy, biopsy of papillary lesions at the right ureteral orifice (path--urothelial papilloma), right ureteroscopy with holmium laser and extraction of stone as well as stent placement.  He has since remove the stent.  He has passed multiple small fragments.   7.12.2022: Cystoscopy negative  12.13.2022: Here early for follow-up.  Had 3 to 4 days of total gross painless hematuria which cleared up about a week ago.  No change in urinary pattern, no flank or abdominal symptoms at that time.  His CT scan for hematuria evaluation was negative.   1.30.2023: He underwent cystoscopy, bladder biopsy and TUR of papillary lesions within the prostatic urethra.  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  2.14.2023: He has urgency and frequency since his procedure but no current dysuria or gross hematuria.  His wife and daughter accompany him to the visit today.  Past Medical History:  Diagnosis Date   Arthritis    DJD right knee   Benign prostatic hypertrophy with urinary frequency    Bladder cancer (HCC)    Complication of anesthesia    gets combative in recovery   COPD with emphysema (HCC)    Depression    Dyspnea    when walking   Dysrhythmia    a fib   History of colon polyps    History of kidney stones    History of loop recorder    loop recorder in place battery is dead has not had changed due to covid   History of pneumonia    hx Recurrent -- last bout Dec 2016 CAP-- per pt resolved   History of TIA (transient ischemic attack) no residual per pt   per neurologist note (dr Leta Baptist 11-08-2015) dx cryptogenic TIA versus arrhythia versus dysautonomia (right ophthalmic  artery TIA and x3 posterior circulation TIAs   Hyperlipidemia    Hypertension    Multinodular thyroid    per pathology report 11-12-2014 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   Pulmonary nodule, left    left lower lobe x2 per CT 01-20-2016   Renal cyst, left    Stroke [facility])    TIA's   Thoracic aortic aneurysm without rupture ([place])    ascending -- ct chest 01/10/16  4.8cm   Wears hearing aid    bilateral-- wear at times    Past Surgical History:  Procedure Laterality Date   ANTERIOR CERVICAL DECOMP/DISCECTOMY FUSION N/A 04/03/2017   Procedure: Cervical three-four, Cervical four-five Anterior cervical decompression/discectomy/fusion;  Surgeon: Erline Levine, MD;  Location: [facility];  Service: Neurosurgery;  Laterality: N/A;   CATARACT EXTRACTION W/ INTRAOCULAR LENS IMPLANT Right 2016   CATARACT EXTRACTION W/PHACO  12/16/2012   Procedure: CATARACT EXTRACTION PHACO AND INTRAOCULAR LENS PLACEMENT ([place]);  Surgeon: Tonny Branch, MD;  [facility];  Service: Ophthalmology;  Laterality: Left;  CDE:17.31   COLONOSCOPY  10/03/2011   Procedure: COLONOSCOPY;  Surgeon: Jamesetta So;  [facility];  Service: Gastroenterology;  Laterality: N/A;   CYSTOSCOPY/RETROGRADE/URETEROSCOPY/STONE EXTRACTION WITH BASKET Right 03/29/2020   Procedure: CYSTOSCOPY/RETROGRADE/URETEROSCOPY/STONE EXTRACTION WITH BASKET;  Surgeon: Franchot Gallo, MD;  [facility];  Service: Urology;  Laterality: Right;  1 HR   DECOMPRESSIOIN ULNAR NERVE AND CUBITAL TUNNEL RELEASE Left 07-14-2009   elbow   EP IMPLANTABLE DEVICE N/A 08/10/2015   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   HOLMIUM LASER APPLICATION Right 4/0/9811    Procedure: HOLMIUM LASER APPLICATION;  Surgeon: Franchot Gallo, MD;  [facility];  Service: Urology;  Laterality: Right;   INGUINAL HERNIA REPAIR Right 1990's   KNEE ARTHROSCOPY Right 2015   LEFT CUBITAL TUNNEL RELEASE     POSTERIOR LUMBAR FUSION  2014   X2   SHOULDER ARTHROSCOPY Left 1990's   TEE WITHOUT CARDIOVERSION N/A 07/27/2015   Procedure: TRANSESOPHAGEAL ECHOCARDIOGRAM (TEE);  Surgeon: Lelon Perla, MD;  Location: [facility] ENDOSCOPY;  Service: Cardiovascular;  Laterality: N/A;   normal LV function, ef 55-60%,  mild AR and MR, mild dilated ascending aorta (4.2cm),  mild to moderate atherosclerosis  descending aorta,  mild to moderate TR,  negative saline microcavitation study   THYROIDECTOMY Right 01/20/2015   Procedure: RIGHT THYROIDECTOMY;  Surgeon: Ascencion Dike, MD;  Location: [facility] OR;  Service: ENT;  Laterality: Right;   TONSILLECTOMY  as child   TOTAL HIP ARTHROPLASTY Left 12/14/2020   Procedure: TOTAL HIP ARTHROPLASTY ANTERIOR APPROACH;  Surgeon: [name], MD;  [facility];  Service: Orthopedics;  Laterality: Left;   TRANSURETHRAL RESECTION OF BLADDER TUMOR N/A 05/15/2016   Procedure: TRANSURETHRAL RESECTION OF BLADDER TUMOR (TURBT) AND INSTILLATION OF EPIRUBICIN;  Surgeon: Franchot Gallo, MD;  Location: [facility];  Service: Urology;  Laterality: N/A;   TRANSURETHRAL RESECTION OF BLADDER TUMOR N/A 03/29/2020   Procedure: TRANSURETHRAL RESECTION OF BLADDER TUMOR (TURBT);  Surgeon: Franchot Gallo, MD;  [facility];  Service: Urology;  Laterality: N/A;   TRANSURETHRAL RESECTION OF BLADDER TUMOR WITH MITOMYCIN-C N/A 01/23/2022   Procedure: TRANSURETHRAL RESECTION OF BLADDER TUMOR WITH POST OPERATIVE GEMCITABINE INSTILLATION;  Surgeon: Franchot Gallo, MD;  [facility];  Service: Urology;  Laterality: N/A;    Home Medications:  Allergies as of 08/08/2022       Reactions   Flomax [tamsulosin Hcl] Nausea And Vomiting, Other (See Comments)    "Pt thought he was dying " DIZZY        Medication List        Accurate as of August 07, 2022 11:18 AM. If you have any questions, ask your nurse or doctor.          acetaminophen 500 MG tablet Commonly known as: TYLENOL Take 2 tablets (1,000 mg total) by mouth every 6 (six) hours. What changed:  how much to take when to take this   atorvastatin 20 MG tablet Commonly known as: LIPITOR TAKE 1 TABLET BY MOUTH DAILY.   Breztri Aerosphere 160-9-4.8 MCG/ACT Aero Generic drug: Budeson-Glycopyrrol-Formoterol INHALE (2) PUFFS INTO THE LUNGS IN THE MORNING AND AT BEDTIME   Eliquis 5 MG Tabs tablet Generic drug: apixaban Take 5 mg by mouth 2 (two) times daily.   finasteride 5 MG tablet Commonly known as: PROSCAR Take 5 mg by mouth daily.   loratadine 10 MG tablet Commonly known as: CLARITIN Take 10 mg by mouth daily.   Magnesium 500 MG Tabs Take 500 mg by mouth daily.   sertraline 50 MG tablet Commonly known as: ZOLOFT Take 50 mg by mouth every  morning.   Ventolin HFA 108 (90 Base) MCG/ACT inhaler Generic drug: albuterol Inhale 2 puffs into the lungs Every 4 hours as needed for wheezing or shortness of breath.   verapamil 240 MG CR tablet Commonly known as: CALAN-SR Take 240 mg by mouth daily.   Vitamin D 50 MCG (2000 UT) tablet Take 2,000 Units by mouth daily.        Allergies:  Allergies  Allergen Reactions   Flomax [Tamsulosin Hcl] Nausea And Vomiting and Other (See Comments)    "Pt thought he was dying " DIZZY    Family History  Problem Relation Age of Onset   Stroke Sister    Breast cancer Sister    Stroke Brother     Social History:  reports that he quit smoking about 11 years ago. His smoking use included cigarettes. He has a 45.00 pack-year smoking history. He has never used smokeless tobacco. He reports that he does not drink alcohol and does not use drugs.  ROS: A complete review of systems was performed.  All systems are negative  except for pertinent findings as noted.  Physical Exam:  Vital signs in last 24 hours: There were no vitals taken for this visit. Constitutional:  Alert and oriented, No acute distress Cardiovascular: Regular rate  Respiratory: Normal respiratory effort Neurologic: Grossly intact, no focal deficits Psychiatric: Normal mood and affect  I have reviewed prior pt notes  I have reviewed notes from referring/previous physicians  I have reviewed urinalysis results  I have independently reviewed prior imaging  I have reviewed prior PSA results    Impression/Assessment:  ***  Plan:  ***

## 2022-08-08 ENCOUNTER — Encounter: Payer: Self-pay | Admitting: Urology

## 2022-08-08 ENCOUNTER — Ambulatory Visit: Payer: Medicare Other | Admitting: Urology

## 2022-08-08 VITALS — BP 132/79 | HR 61 | Ht 73.0 in | Wt 202.0 lb

## 2022-08-08 DIAGNOSIS — Z87442 Personal history of urinary calculi: Secondary | ICD-10-CM | POA: Diagnosis not present

## 2022-08-08 DIAGNOSIS — N200 Calculus of kidney: Secondary | ICD-10-CM

## 2022-08-08 DIAGNOSIS — Z8551 Personal history of malignant neoplasm of bladder: Secondary | ICD-10-CM | POA: Diagnosis not present

## 2022-08-08 DIAGNOSIS — R3129 Other microscopic hematuria: Secondary | ICD-10-CM

## 2022-08-08 DIAGNOSIS — C678 Malignant neoplasm of overlapping sites of bladder: Secondary | ICD-10-CM

## 2022-08-08 MED ORDER — CIPROFLOXACIN HCL 500 MG PO TABS
500.0000 mg | ORAL_TABLET | Freq: Once | ORAL | Status: AC
  Administered 2022-08-08: 500 mg via ORAL

## 2022-08-10 LAB — URINALYSIS, ROUTINE W REFLEX MICROSCOPIC
Bilirubin, UA: NEGATIVE
Glucose, UA: NEGATIVE
Ketones, UA: NEGATIVE
Leukocytes,UA: NEGATIVE
Nitrite, UA: NEGATIVE
Protein,UA: NEGATIVE
RBC, UA: NEGATIVE
Specific Gravity, UA: 1.02 (ref 1.005–1.030)
Urobilinogen, Ur: 0.2 mg/dL (ref 0.2–1.0)
pH, UA: 5 (ref 5.0–7.5)

## 2022-08-10 LAB — CYTOLOGY, URINE

## 2022-09-05 ENCOUNTER — Ambulatory Visit: Payer: Medicare Other | Admitting: Physician Assistant

## 2022-09-05 ENCOUNTER — Telehealth: Payer: Self-pay

## 2022-09-05 NOTE — Telephone Encounter (Signed)
-----   Message from [facility], LPN sent at 0/98/1191  8:14 AM EDT -----  ----- Message ----- From: Franchot Gallo, MD Sent: 09/04/2022   8:00 PM EDT To: Iris Pert, LPN  Please call pt--cytology showed only atypical cells that are consitent with his prior therapy--no cancer cells seen ----- Message ----- From: Iris Pert, LPN Sent: 4/78/2956   3:56 PM EDT To: Franchot Gallo, MD  Please review

## 2022-09-05 NOTE — Telephone Encounter (Signed)
Wife states they both tested negative for Covid.  No current sxs, they will keep apt tomorrow for BCG.

## 2022-09-05 NOTE — Telephone Encounter (Signed)
Called patient to inform him of his results per Dr. Diona Fanti.  [name] wife states that patient had a low grade fever yesterday.  No fever today, he went to see his PCP and they did a ua that came back clear.  They are asking if he needs to keep his apt for BCG treatment tomorrow.  Please advise in Dr. Erma Heritage absence.

## 2022-09-06 ENCOUNTER — Ambulatory Visit (INDEPENDENT_AMBULATORY_CARE_PROVIDER_SITE_OTHER): Payer: Medicare Other | Admitting: Physician Assistant

## 2022-09-06 DIAGNOSIS — C679 Malignant neoplasm of bladder, unspecified: Secondary | ICD-10-CM

## 2022-09-06 LAB — URINALYSIS, ROUTINE W REFLEX MICROSCOPIC
Bilirubin, UA: NEGATIVE
Glucose, UA: NEGATIVE
Ketones, UA: NEGATIVE
Leukocytes,UA: NEGATIVE
Nitrite, UA: NEGATIVE
Protein,UA: NEGATIVE
Specific Gravity, UA: 1.015 (ref 1.005–1.030)
Urobilinogen, Ur: 0.2 mg/dL (ref 0.2–1.0)
pH, UA: 5 (ref 5.0–7.5)

## 2022-09-06 LAB — MICROSCOPIC EXAMINATION
Bacteria, UA: NONE SEEN
Epithelial Cells (non renal): NONE SEEN /hpf (ref 0–10)
RBC, Urine: NONE SEEN /hpf (ref 0–2)
Renal Epithel, UA: NONE SEEN /hpf
WBC, UA: NONE SEEN /hpf (ref 0–5)

## 2022-09-06 MED ORDER — BCG LIVE 50 MG IS SUSR
3.2400 mL | Freq: Once | INTRAVESICAL | Status: AC
  Administered 2022-09-06: 81 mg via INTRAVESICAL

## 2022-09-06 NOTE — Addendum Note (Signed)
Addended by: Simonne Come A on: 09/06/2022 11:05 AM   Modules accepted: Level of Service

## 2022-09-06 NOTE — Patient Instructions (Signed)

## 2022-09-06 NOTE — Progress Notes (Deleted)
Assessment: 1. Malignant neoplasm of urinary bladder, unspecified site (HCC) - bcg vaccine injection 81 mg - Urinalysis, Routine w reflex microscopic    Plan: FU next week for BCG tx 2/3. Discussed expectations post tx today and pt will let us know if he has concerns before next week.  Chief Complaint: No chief complaint on file.   HPI: Kyle Owen is a 81 y.o. male who presents for continued evaluation of bladder cancer. He is scheduled for second course of maintenance BCG tx 1/3 today. No gross hematuria. Minimal LUTS  UA=clear   08/08/22 4.5.20221-Underwent TURBT/Rt URS/stone mgmt.   Initial TURBT of a 2 cm papillary bladder tumor on 5.22.2017.  Pathology revealed papillary low-grade NMIBC. Epirubicin was placed post resection.   4.5.2021: He underwent cystoscopy, biopsy of papillary lesions at the right ureteral orifice (path--urothelial papilloma), right ureteroscopy with holmium laser and extraction of stone as well as stent placement.  He has since remove the stent.  He has passed multiple small fragments.   7.12.2022: Cystoscopy negative  12.13.2022: Here early for follow-up.  Had 3 to 4 days of total gross painless hematuria which cleared up about a week ago.  No change in urinary pattern, no flank or abdominal symptoms at that time.  His CT scan for hematuria evaluation was negative.   1.30.2023: He underwent cystoscopy, bladder biopsy and TUR of papillary lesions within the prostatic urethra.  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  2.14.2023: He has urgency and frequency since his procedure but no current dysuria or gross hematuria.  His wife and daughter accompany him to the visit today.   3.28.2023: Completed BCG induction   5.9.2023: Cystoscopy negative.  Cytology revealed atypical cells.   6.28.2023: Completed maintenance BCG   8.15.2023: Here for cystoscopy.  He has no significant  lower urinary tract symptoms.  He has not had blood in his urine.  He tolerated  Portions of the above documentation were copied from a prior visit for review purposes only.  Allergies: Allergies  Allergen Reactions   Flomax [Tamsulosin Hcl] Nausea And Vomiting and Other (See Comments)    "Pt thought he was dying " DIZZY    PMH: Past Medical History:  Diagnosis Date   Arthritis    DJD right knee   Benign prostatic hypertrophy with urinary frequency    Bladder cancer ([place])    Complication of anesthesia    gets combative in recovery   COPD with emphysema (HCC)    Depression    Dyspnea    when walking   Dysrhythmia    a fib   History of colon polyps    History of kidney stones    History of loop recorder    loop recorder in place battery is dead has not had changed due to covid   History of pneumonia    hx Recurrent -- last bout Dec 2016 CAP-- per pt resolved   History of TIA (transient ischemic attack) no residual per pt   per neurologist note (dr Leta Baptist 11-08-2015) dx cryptogenic TIA versus arrhythia versus dysautonomia (right ophthalmic artery TIA and x3 posterior circulation TIAs   Hyperlipidemia    Hypertension    Multinodular thyroid    per pathology report 11-12-2014 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   Pulmonary nodule, left    left lower lobe x2 per CT 01-20-2016   Renal cyst, left    Stroke [facility])    TIA's   Thoracic aortic aneurysm without rupture ([place])    ascending -- ct chest 01/10/16  4.8cm   Wears hearing aid    bilateral-- wear at times    PSH: Past Surgical History:  Procedure Laterality Date   ANTERIOR CERVICAL DECOMP/DISCECTOMY FUSION N/A 04/03/2017   Procedure:  Cervical three-four, Cervical four-five Anterior cervical decompression/discectomy/fusion;  Surgeon: Erline Levine, MD;  Location: [place] [facility];  Service: Neurosurgery;  Laterality: N/A;   CATARACT EXTRACTION W/ INTRAOCULAR LENS IMPLANT Right 2016   CATARACT EXTRACTION W/PHACO  12/16/2012   Procedure: CATARACT EXTRACTION PHACO AND INTRAOCULAR LENS PLACEMENT ([place]);  Surgeon: Tonny Branch, MD;  [facility];  Service: Ophthalmology;  Laterality: Left;  CDE:17.31   COLONOSCOPY  10/03/2011   Procedure: COLONOSCOPY;  Surgeon: Jamesetta So;  [facility];  Service: Gastroenterology;  Laterality: N/A;   CYSTOSCOPY/RETROGRADE/URETEROSCOPY/STONE EXTRACTION WITH BASKET Right 03/29/2020   Procedure: CYSTOSCOPY/RETROGRADE/URETEROSCOPY/STONE EXTRACTION WITH BASKET;  Surgeon: Franchot Gallo, MD;  [facility];  Service: Urology;  Laterality: Right;  1 HR   DECOMPRESSIOIN ULNAR NERVE AND CUBITAL TUNNEL RELEASE Left 07-14-2009   elbow   EP IMPLANTABLE DEVICE N/A 08/10/2015   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   HOLMIUM LASER APPLICATION Right 06/02/6294   Procedure: HOLMIUM LASER APPLICATION;  Surgeon: Franchot Gallo, MD;  [facility];  Service: Urology;  Laterality: Right;   INGUINAL HERNIA REPAIR Right 1990's   KNEE ARTHROSCOPY Right 2015   LEFT CUBITAL TUNNEL RELEASE     POSTERIOR LUMBAR FUSION  2014   X2   SHOULDER ARTHROSCOPY Left 1990's   TEE WITHOUT CARDIOVERSION N/A 07/27/2015   Procedure: TRANSESOPHAGEAL ECHOCARDIOGRAM (TEE);  Surgeon: Lelon Perla, MD;  Location: [facility] ENDOSCOPY;  Service: Cardiovascular;  Laterality: N/A;   normal LV function, ef 55-60%,  mild AR and MR, mild dilated ascending aorta (4.2cm),  mild to moderate atherosclerosis  descending aorta,  mild to moderate TR,  negative saline microcavitation study   THYROIDECTOMY Right 01/20/2015   Procedure: RIGHT THYROIDECTOMY;  Surgeon: Ascencion Dike, MD;  Location: [facility] OR;  Service: ENT;  Laterality: Right;    TONSILLECTOMY  as child   TOTAL HIP ARTHROPLASTY Left 12/14/2020   Procedure: TOTAL HIP ARTHROPLASTY ANTERIOR APPROACH;  Surgeon: [name], MD;  [facility];  Service: Orthopedics;  Laterality: Left;   TRANSURETHRAL RESECTION OF BLADDER TUMOR N/A 05/15/2016   Procedure: TRANSURETHRAL RESECTION OF BLADDER TUMOR (TURBT) AND INSTILLATION OF EPIRUBICIN;  Surgeon: Franchot Gallo, MD;  Location: [facility];  Service: Urology;  Laterality: N/A;   TRANSURETHRAL RESECTION OF BLADDER TUMOR N/A 03/29/2020   Procedure: TRANSURETHRAL RESECTION OF BLADDER TUMOR (TURBT);  Surgeon: Franchot Gallo, MD;  [facility];  Service: Urology;  Laterality: N/A;   TRANSURETHRAL RESECTION OF BLADDER TUMOR WITH MITOMYCIN-C N/A 01/23/2022   Procedure: TRANSURETHRAL RESECTION OF BLADDER TUMOR WITH POST OPERATIVE GEMCITABINE INSTILLATION;  Surgeon: Franchot Gallo, MD;  [facility];  Service: Urology;  Laterality: N/A;    SH: Social History   Tobacco Use   Smoking status: Former    Packs/day: 1.00    Years: 45.00    Total pack years: 45.00    Types: Cigarettes  Quit date: 09/26/2010    Years since quitting: 11.9   Smokeless tobacco: Never  Vaping Use   Vaping Use: Never used  Substance Use Topics   Alcohol use: No   Drug use: Never    ROS: All other review of systems were reviewed and are negative except what is noted above in HPI  PE: There were no vitals taken for this visit. GENERAL APPEARANCE:  Well appearing, well developed, well nourished, NAD HEENT:  Atraumatic, normocephalic NECK:  Supple. Trachea midline Lungs: Unlabored respirations ABDOMEN:  Soft, non-tender, no masses  Results:   Urinalysis    Component Value Date/Time   COLORURINE YELLOW 06/13/2016 1120   APPEARANCEUR Clear 08/08/2022 1041   LABSPEC 1.020 06/13/2016 1120   PHURINE 5.5 06/13/2016 1120   GLUCOSEU Negative 08/08/2022 1041   HGBUR NEGATIVE 06/13/2016 1120   BILIRUBINUR  Negative 08/08/2022 1041   KETONESUR NEGATIVE 06/13/2016 1120   PROTEINUR Negative 08/08/2022 1041   PROTEINUR NEGATIVE 06/13/2016 1120   UROBILINOGEN negative (A) 05/04/2020 0930   NITRITE Negative 08/08/2022 1041   NITRITE NEGATIVE 06/13/2016 1120   LEUKOCYTESUR Negative 08/08/2022 1041    Lab Results  Component Value Date   LABMICR Comment 06/21/2022   WBCUA 0-5 03/21/2022   LABEPIT 0-10 03/21/2022   MUCUS Present 02/07/2022   BACTERIA None seen 03/21/2022    Pertinent Imaging: No results found for this or any previous visit.  No results found for this or any previous visit.  No results found for this or any previous visit.  No results found for this or any previous visit.  Results for orders placed during the hospital encounter of 05/11/20  US RENAL  Narrative CLINICAL DATA:  Calculus of kidney. Additional history provided: Prior TURP, right stone extraction April 2021.  EXAM: RENAL / URINARY TRACT ULTRASOUND COMPLETE  COMPARISON:  CT abdomen/pelvis 02/27/2020.  FINDINGS: Right Kidney:  Renal measurements: 10.6 x 4.8 x 4.9 cm = volume: 130.3 mL. There is renal cortical thinning. No mass or hydronephrosis visualized. No appreciable renal calculus. Specifically, the 1-2 mm upper pole calculus demonstrated on prior CT 02/27/2020 is not sonographically appreciable.  Left Kidney:  Renal measurements: 11.5 x 6.5 x 6.9 cm = volume: 271.4 mL. There is renal cortical thinning. No mass or hydronephrosis visualized. No appreciable renal calculus. 3.0 x 2.3 x 3.1 cm simple appear interpolar cyst. 1.6 x 1.6 x 1.6 cm simple appearing upper pole renal cyst.  Bladder:  Appears normal for degree of bladder distention. Bilateral ureteral jets are demonstrated.  IMPRESSION: No hydronephrosis.  No sonographically appreciable renal calculi.  Bilateral renal cortical thinning suggestive of chronic renal parenchymal disease.  Simple appearing left renal  cysts.   Electronically Signed By: Kellie Simmering DO On: 05/11/2020 10:24  No results found for this or any previous visit.  No results found for this or any previous visit.  No results found for this or any previous visit.  No results found for this or any previous visit (from the past 24 hour(s)).

## 2022-09-06 NOTE — Progress Notes (Signed)
BCG Bladder Instillation  BCG # 1 of 3  Due to Bladder Cancer patient is present today for a BCG treatment. Patient was cleaned and prepped in a sterile fashion with betadine. A 14 FR catheter was inserted, urine return was noted 30 ml, urine was yellow in color.  10m of reconstituted BCG was instilled into the bladder. The catheter was then removed. Patient tolerated well, no complications were noted  Performed by: SMarisue Brooklyn CMA  Follow up/ Additional notes: Follow up as scheduled

## 2022-09-08 ENCOUNTER — Emergency Department (HOSPITAL_COMMUNITY)
Admission: EM | Admit: 2022-09-08 | Discharge: 2022-09-08 | Disposition: A | Discharge status: Home / Self Care | Payer: Medicare Other | Attending: Emergency Medicine | Admitting: Emergency Medicine

## 2022-09-08 ENCOUNTER — Emergency Department (HOSPITAL_COMMUNITY): Payer: Medicare Other

## 2022-09-08 ENCOUNTER — Telehealth: Payer: Self-pay | Admitting: Pulmonary Disease

## 2022-09-08 ENCOUNTER — Other Ambulatory Visit: Payer: Self-pay

## 2022-09-08 DIAGNOSIS — Z79899 Other long term (current) drug therapy: Secondary | ICD-10-CM | POA: Diagnosis not present

## 2022-09-08 DIAGNOSIS — I4891 Unspecified atrial fibrillation: Secondary | ICD-10-CM | POA: Insufficient documentation

## 2022-09-08 DIAGNOSIS — J441 Chronic obstructive pulmonary disease with (acute) exacerbation: Secondary | ICD-10-CM | POA: Insufficient documentation

## 2022-09-08 DIAGNOSIS — Z20822 Contact with and (suspected) exposure to covid-19: Secondary | ICD-10-CM | POA: Insufficient documentation

## 2022-09-08 DIAGNOSIS — J181 Lobar pneumonia, unspecified organism: Secondary | ICD-10-CM | POA: Insufficient documentation

## 2022-09-08 DIAGNOSIS — Z8551 Personal history of malignant neoplasm of bladder: Secondary | ICD-10-CM | POA: Insufficient documentation

## 2022-09-08 DIAGNOSIS — J189 Pneumonia, unspecified organism: Secondary | ICD-10-CM

## 2022-09-08 DIAGNOSIS — R509 Fever, unspecified: Secondary | ICD-10-CM | POA: Diagnosis present

## 2022-09-08 LAB — BASIC METABOLIC PANEL
Anion gap: 7 (ref 5–15)
BUN: 32 mg/dL — ABNORMAL HIGH (ref 8–23)
CO2: 27 mmol/L (ref 22–32)
Calcium: 8.8 mg/dL — ABNORMAL LOW (ref 8.9–10.3)
Chloride: 106 mmol/L (ref 98–111)
Creatinine, Ser: 1.47 mg/dL — ABNORMAL HIGH (ref 0.61–1.24)
GFR, Estimated: 47 mL/min — ABNORMAL LOW (ref >60)
Glucose, Bld: 94 mg/dL (ref 70–99)
Potassium: 4.4 mmol/L (ref 3.5–5.1)
Sodium: 140 mmol/L (ref 135–145)

## 2022-09-08 LAB — CBC WITH DIFFERENTIAL/PLATELET
Abs Immature Granulocytes: 0.02 10*3/uL (ref 0.00–0.07)
Basophils Absolute: 0 10*3/uL (ref 0.0–0.1)
Basophils Relative: 1 %
Eosinophils Absolute: 0.2 10*3/uL (ref 0.0–0.5)
Eosinophils Relative: 4 %
HCT: 41.6 % (ref 39.0–52.0)
Hemoglobin: 13.1 g/dL (ref 13.0–17.0)
Immature Granulocytes: 1 %
Lymphocytes Relative: 25 %
Lymphs Abs: 1.1 10*3/uL (ref 0.7–4.0)
MCH: 31.3 pg (ref 26.0–34.0)
MCHC: 31.5 g/dL (ref 30.0–36.0)
MCV: 99.5 fL (ref 80.0–100.0)
Monocytes Absolute: 0.5 10*3/uL (ref 0.1–1.0)
Monocytes Relative: 12 %
Neutro Abs: 2.6 10*3/uL (ref 1.7–7.7)
Neutrophils Relative %: 57 %
Platelets: 202 10*3/uL (ref 150–400)
RBC: 4.18 MIL/uL — ABNORMAL LOW (ref 4.22–5.81)
RDW: 13.1 % (ref 11.5–15.5)
WBC: 4.4 10*3/uL (ref 4.0–10.5)
nRBC: 0 % (ref 0.0–0.2)

## 2022-09-08 LAB — SARS CORONAVIRUS 2 BY RT PCR: SARS Coronavirus 2 by RT PCR: NEGATIVE

## 2022-09-08 LAB — BRAIN NATRIURETIC PEPTIDE: B Natriuretic Peptide: 158 pg/mL — ABNORMAL HIGH (ref 0.0–100.0)

## 2022-09-08 LAB — D-DIMER, QUANTITATIVE: D-Dimer, Quant: 0.45 ug/mL-FEU (ref 0.00–0.50)

## 2022-09-08 MED ORDER — SODIUM CHLORIDE 0.9 % IV SOLN
2.0000 g | Freq: Once | INTRAVENOUS | Status: AC
  Administered 2022-09-08: 2 g via INTRAVENOUS
  Filled 2022-09-08: qty 20

## 2022-09-08 MED ORDER — AZITHROMYCIN 250 MG PO TABS
500.0000 mg | ORAL_TABLET | Freq: Once | ORAL | Status: AC
  Administered 2022-09-08: 500 mg via ORAL
  Filled 2022-09-08: qty 2

## 2022-09-08 MED ORDER — AMOXICILLIN-POT CLAVULANATE 875-125 MG PO TABS: 1.0000 | ORAL_TABLET | Freq: Two times a day (BID) | ORAL | 0 refills | Status: DC

## 2022-09-08 NOTE — ED Provider Notes (Signed)
[facility] EMERGENCY DEPARTMENT Provider Note   CSN: [id number] Arrival date & time: 09/08/22  1500     History  Chief Complaint  Patient presents with   Low O2    Kyle Owen is a 81 y.o. male.  HPI   Pt has history of depression, bladder cancer, a fib, thoracic aneurysm.  Patient states this past week he has been having some trouble with intermittent fevers chills and cough.  He has seen his doctor for this and was told everything looked fine.  Patient is also getting treatment for bladder cancer.  He is receiving BCG treatments for that.  Patient has history of COPD and uses oxygen at night but not normally during the day.  Today he walked from one room to the other in the house.  He started to feel somewhat short of breath checked his oxygen saturation and was in the low 80s.  Patient came to the ED for further evaluation.  Right now he is feeling fine.  He has not had any chest pain.  He does not feel short of breath.  He has not had any leg swelling  Home Medications Prior to Admission medications   Medication Sig Start Date End Date Taking? Authorizing Provider  acetaminophen (TYLENOL) 500 MG tablet Take 2 tablets (1,000 mg total) by mouth every 6 (six) hours. Patient taking differently: Take 500-1,000 mg by mouth daily. 12/15/20  Yes Rachael Fee, PA-C  amoxicillin-clavulanate (AUGMENTIN) 875-125 MG tablet Take 1 tablet by mouth every 12 (twelve) hours. 09/08/22  Yes Dorie Rank, MD  atorvastatin (LIPITOR) 20 MG tablet TAKE 1 TABLET BY MOUTH DAILY. 05/09/18  Yes Jerline Pain, MD  Budeson-Glycopyrrol-Formoterol (BREZTRI AEROSPHERE) 160-9-4.8 MCG/ACT AERO INHALE (2) PUFFS INTO THE LUNGS IN THE MORNING AND AT BEDTIME 07/12/22  Yes Rigoberto Noel, MD  Cholecalciferol (VITAMIN D) 50 MCG (2000 UT) tablet Take 2,000 Units by mouth daily.   Yes [provider]  ELIQUIS 5 MG TABS tablet Take 5 mg by mouth 2 (two) times daily. 02/10/22  Yes [provider]   finasteride (PROSCAR) 5 MG tablet Take 5 mg by mouth daily.    Yes [provider]  Liniments (DEEP BLUE RELIEF EX) Apply topically.   Yes [provider]  loratadine (CLARITIN) 10 MG tablet Take 10 mg by mouth daily.   Yes [provider]  Magnesium 500 MG TABS Take 500 mg by mouth daily.    Yes [provider]  sertraline (ZOLOFT) 50 MG tablet Take 50 mg by mouth every morning.   Yes [provider]  VENTOLIN HFA 108 (90 BASE) MCG/ACT inhaler Inhale 2 puffs into the lungs Every 4 hours as needed for wheezing or shortness of breath.  09/24/12  Yes [provider]  verapamil (CALAN-SR) 240 MG CR tablet Take 240 mg by mouth daily.   Yes [provider]      Allergies    Flomax [tamsulosin hcl]    Review of Systems   Review of Systems  Physical Exam Updated Vital Signs BP 122/66   Pulse (!) 57   Temp 98.2 F (36.8 C) (Oral)   Resp 19   Ht 1.854 m ('6\' 1"'$ )   Wt 88 kg   SpO2 95%   BMI 25.60 kg/m  Physical Exam Vitals and nursing note reviewed.  Constitutional:      Appearance: He is well-developed. He is not diaphoretic.  HENT:     Head: Normocephalic and atraumatic.  Right Ear: External ear normal.     Left Ear: External ear normal.  Eyes:     General: No scleral icterus.       Right eye: No discharge.        Left eye: No discharge.     Conjunctiva/sclera: Conjunctivae normal.  Neck:     Trachea: No tracheal deviation.  Cardiovascular:     Rate and Rhythm: Normal rate and regular rhythm.  Pulmonary:     Effort: Pulmonary effort is normal. No respiratory distress.     Breath sounds: Normal breath sounds. No stridor. No wheezing or rales.  Abdominal:     General: Bowel sounds are normal. There is no distension.     Palpations: Abdomen is soft.     Tenderness: There is no abdominal tenderness. There is no guarding or rebound.  Musculoskeletal:        General: No tenderness or deformity.     Cervical back:  Neck supple.  Skin:    General: Skin is warm and dry.     Findings: No rash.  Neurological:     General: No focal deficit present.     Mental Status: He is alert.     Cranial Nerves: No cranial nerve deficit (no facial droop, extraocular movements intact, no slurred speech).     Sensory: No sensory deficit.     Motor: No abnormal muscle tone or seizure activity.     Coordination: Coordination normal.  Psychiatric:        Mood and Affect: Mood normal.     ED Results / Procedures / Treatments   Labs (all labs ordered are listed, but only abnormal results are displayed) Labs Reviewed  BASIC METABOLIC PANEL - Abnormal; Notable for the following components:      Result Value   BUN 32 (*)    Creatinine, Ser 1.47 (*)    Calcium 8.8 (*)    GFR, Estimated 47 (*)    All other components within normal limits  CBC WITH DIFFERENTIAL/PLATELET - Abnormal; Notable for the following components:   RBC 4.18 (*)    All other components within normal limits  BRAIN NATRIURETIC PEPTIDE - Abnormal; Notable for the following components:   B Natriuretic Peptide 158.0 (*)    All other components within normal limits  SARS CORONAVIRUS 2 BY RT PCR  D-DIMER, QUANTITATIVE    EKG EKG Interpretation  Date/Time:  Friday September 08 2022 15:42:35 EDT Ventricular Rate:  60 PR Interval:  188 QRS Duration: 98 QT Interval:  427 QTC Calculation: 427 R Axis:   72 Text Interpretation: Sinus rhythm Low voltage, extremity and precordial leads Since last tracing rate slower Confirmed by Dorie Rank [phone]) on 09/08/2022 4:25:02 PM  Radiology DG Chest 2 View  Result Date: 09/08/2022 CLINICAL DATA:  Cough and hypoxemia EXAM: CHEST - 2 VIEW COMPARISON:  Chest 03/28/2017 FINDINGS: Interval development of left lower lobe airspace disease. Probable pneumonia. No significant effusion Right lung clear.  Negative for heart failure or edema. Chronic left AC separation unchanged. Cardiac loop recorder unchanged  IMPRESSION: Left lower lobe infiltrate, probable pneumonia Electronically Signed   By: Franchot Gallo M.D.   On: 09/08/2022 16:10    Procedures Procedures    Medications Ordered in ED Medications  cefTRIAXone (ROCEPHIN) 2 g in sodium chloride 0.9 % 100 mL IVPB (2 g Intravenous New Bag/Given 09/08/22 1703)  azithromycin (ZITHROMAX) tablet 500 mg (500 mg Oral Given 09/08/22 1700)    ED Course/ Medical Decision Making/  A&P Clinical Course as of 09/08/22 1722  Fri Sep 08, 2022  1624 Chest x-ray suggests a probable left lower lobe pneumonia [JK]  1701 SARS Coronavirus 2 by RT PCR (hospital order, performed in [facility] hospital lab) *cepheid single result test* Anterior Nasal Swab COVID-negative [JK]  1701 Brain natriuretic peptide(!) BMP normal [JK]    Clinical Course User Index [JK] Dorie Rank, MD                           Medical Decision Making Problems Addressed: Community acquired pneumonia of left lung, unspecified part of lung: acute illness or injury that poses a threat to life or bodily functions COPD exacerbation ([place]): chronic illness or injury with exacerbation, progression, or side effects of treatment  Amount and/or Complexity of Data Reviewed Labs: ordered. Decision-making details documented in ED Course. Radiology: ordered and independent interpretation performed. Decision-making details documented in ED Course.  Risk Prescription drug management. Risk Details: Patient presented to the ED for evaluation of a low oxygen saturation.  No signs of CHF on his chest x-ray.  D-dimer is negative doubt PE.  X-ray does suggest pneumonia and patient has been having symptoms suggestive of pneumonia this past week.  Patient was given a dose of IV antibiotics.  Discussed options of admission to the hospital versus outpatient antibiotics and close follow-up.  Patient otherwise is nontoxic and well-appearing.  He does have an oxygen requirement however he does have supplemental oxygen  available at home.  Patient would prefer to go home and be discharged.  Recommend close follow-up with his pulmonologist.  Warning signs precautions discussed          Final Clinical Impression(s) / ED Diagnoses Final diagnoses:  Community acquired pneumonia of left lung, unspecified part of lung  COPD exacerbation ([place])    Rx / DC Orders ED Discharge Orders          Ordered    amoxicillin-clavulanate (AUGMENTIN) 875-125 MG tablet  Every 12 hours        09/08/22 1718              Dorie Rank, MD 09/08/22 1722

## 2022-09-08 NOTE — Telephone Encounter (Addendum)
The wife has called and the patient is having a fever around 99 and 100 about a week ago. He was checked 2 times for Covid and it was negative.   He is having some green sputum for a few days. He has been having to use his oxygen for 2-3 days at 2 liters during the day to keep up above 90 and he is feeling tired and per the wife this is not normal for him. She is very concerned about his well being.  When the oxygen is off he is staying around 85-86% on room air.   He seen Dr. Willey Blade on Tuesday and was told that all his labs were normal. He has felt worse since his BCG treatment that was earlier this week. She is going to take him to [facility] ER. Please advise if you have any other recommendations?

## 2022-09-08 NOTE — Telephone Encounter (Signed)
Pt also receives bcg treatments

## 2022-09-08 NOTE — ED Triage Notes (Signed)
Pt via EMS from home after personal pulse oximeter read 85% and wife called 911 for pt eval. Hx bladder cancer recently treated with indwelling injection of live TB into bladder, still urinating blood as a side effect of tmt but this is expected per treatment discussion with oncology. Pt denies pain and denies SOB, no complaints at time of arrival. Vitals WNL

## 2022-09-08 NOTE — Discharge Instructions (Signed)
Take the antibiotics as prescribed.  Follow-up with your doctor to be rechecked.  Turn to the ED for worsening symptoms difficulty breathing

## 2022-09-08 NOTE — Telephone Encounter (Signed)
The patient is in the ER at this time. Nothing further needed.

## 2022-09-13 ENCOUNTER — Ambulatory Visit (INDEPENDENT_AMBULATORY_CARE_PROVIDER_SITE_OTHER): Payer: Medicare Other | Admitting: Physician Assistant

## 2022-09-13 DIAGNOSIS — C679 Malignant neoplasm of bladder, unspecified: Secondary | ICD-10-CM | POA: Diagnosis not present

## 2022-09-13 MED ORDER — BCG LIVE 50 MG IS SUSR
3.2400 mL | Freq: Once | INTRAVESICAL | Status: AC
  Administered 2022-09-13: 81 mg via INTRAVESICAL

## 2022-09-13 NOTE — Progress Notes (Signed)
BCG Bladder Instillation  BCG # 2 of 3   Due to Bladder Cancer patient is present today for a BCG treatment. Patient was cleaned and prepped in a sterile fashion with betadine. A 14FR catheter was inserted, urine return was noted 7m, urine was yellow in color.  561mof reconstituted BCG was instilled into the bladder. The catheter was then removed. Patient tolerated well, no complications were noted  Performed by: KoLevi AlandCMA  Follow up/ Additional notes: Follow up for next scheduled treatment.     Procedure reviewed. Agree with clinical documentation.  Julienne A Summerlin PA-C

## 2022-09-14 LAB — URINALYSIS, ROUTINE W REFLEX MICROSCOPIC
Bilirubin, UA: NEGATIVE
Glucose, UA: NEGATIVE
Ketones, UA: NEGATIVE
Leukocytes,UA: NEGATIVE
Nitrite, UA: NEGATIVE
Protein,UA: NEGATIVE
RBC, UA: NEGATIVE
Specific Gravity, UA: 1.025 (ref 1.005–1.030)
Urobilinogen, Ur: 0.2 mg/dL (ref 0.2–1.0)
pH, UA: 5.5 (ref 5.0–7.5)

## 2022-09-19 ENCOUNTER — Ambulatory Visit (INDEPENDENT_AMBULATORY_CARE_PROVIDER_SITE_OTHER): Payer: Medicare Other | Admitting: Physician Assistant

## 2022-09-19 ENCOUNTER — Telehealth: Payer: Self-pay

## 2022-09-19 DIAGNOSIS — C679 Malignant neoplasm of bladder, unspecified: Secondary | ICD-10-CM

## 2022-09-19 MED ORDER — BCG LIVE 50 MG IS SUSR
3.2400 mL | Freq: Once | INTRAVESICAL | Status: AC
  Administered 2022-09-19: 81 mg via INTRAVESICAL

## 2022-09-19 NOTE — Addendum Note (Signed)
Addended by: Darcella Gasman R on: 09/19/2022 12:23 PM   Modules accepted: Level of Service

## 2022-09-19 NOTE — Telephone Encounter (Signed)
Patient completed last BCG treatment today. His cysto is scheduled for 12/19. Would you like patient to come in earlier or is he ok to keep 12/19 appt.

## 2022-09-19 NOTE — Progress Notes (Signed)
BCG Bladder Instillation  BCG # 3 of 3  Due to Bladder Cancer patient is present today for a BCG treatment. Patient was cleaned and prepped in a sterile fashion with betadine. A 14 FR catheter was inserted, urine return was noted 50 ml, urine was yellow in color.  46m of reconstituted BCG was instilled into the bladder. The catheter was then removed. Patient tolerated well, no complications were noted  Performed by: SMarisue Brooklyn CMA  Follow up/ Additional notes: Follow up as schedule

## 2022-09-20 LAB — URINALYSIS, ROUTINE W REFLEX MICROSCOPIC
Bilirubin, UA: NEGATIVE
Glucose, UA: NEGATIVE
Ketones, UA: NEGATIVE
Leukocytes,UA: NEGATIVE
Nitrite, UA: NEGATIVE
Protein,UA: NEGATIVE
RBC, UA: NEGATIVE
Specific Gravity, UA: 1.025 (ref 1.005–1.030)
Urobilinogen, Ur: 0.2 mg/dL (ref 0.2–1.0)
pH, UA: 5.5 (ref 5.0–7.5)

## 2022-10-16 ENCOUNTER — Other Ambulatory Visit: Payer: Self-pay | Admitting: Cardiology

## 2022-10-16 ENCOUNTER — Ambulatory Visit: Payer: Medicare Other | Attending: Cardiovascular Disease

## 2022-10-16 DIAGNOSIS — Z01812 Encounter for preprocedural laboratory examination: Secondary | ICD-10-CM

## 2022-10-16 LAB — BASIC METABOLIC PANEL
BUN/Creatinine Ratio: 19 (ref 10–24)
BUN: 30 mg/dL — ABNORMAL HIGH (ref 8–27)
CO2: 27 mmol/L (ref 20–29)
Calcium: 9.5 mg/dL (ref 8.6–10.2)
Chloride: 105 mmol/L (ref 96–106)
Creatinine, Ser: 1.58 mg/dL — ABNORMAL HIGH (ref 0.76–1.27)
Glucose: 100 mg/dL — ABNORMAL HIGH (ref 70–99)
Potassium: 4.8 mmol/L (ref 3.5–5.2)
Sodium: 143 mmol/L (ref 134–144)
eGFR: 43 mL/min/{1.73_m2} — ABNORMAL LOW (ref >59)

## 2022-10-20 ENCOUNTER — Inpatient Hospital Stay: Admission: RE | Admit: 2022-10-20 | Payer: Medicare Other | Source: Ambulatory Visit

## 2022-10-24 ENCOUNTER — Ambulatory Visit (HOSPITAL_COMMUNITY)
Admission: RE | Admit: 2022-10-24 | Discharge: 2022-10-24 | Disposition: A | Discharge status: Home / Self Care | Payer: Medicare Other | Source: Ambulatory Visit | Attending: Cardiology | Admitting: Cardiology

## 2022-10-24 ENCOUNTER — Encounter (HOSPITAL_COMMUNITY): Payer: Self-pay | Admitting: Radiology

## 2022-10-24 DIAGNOSIS — I712 Thoracic aortic aneurysm, without rupture, unspecified: Secondary | ICD-10-CM | POA: Insufficient documentation

## 2022-10-24 MED ORDER — IOHEXOL 350 MG/ML SOLN
100.0000 mL | Freq: Once | INTRAVENOUS | Status: AC | PRN
  Administered 2022-10-24: 75 mL via INTRAVENOUS

## 2022-10-31 ENCOUNTER — Ambulatory Visit (INDEPENDENT_AMBULATORY_CARE_PROVIDER_SITE_OTHER): Payer: Medicare Other | Admitting: Urology

## 2022-10-31 VITALS — BP 127/75 | HR 66 | Ht 73.0 in | Wt 194.0 lb

## 2022-10-31 DIAGNOSIS — C679 Malignant neoplasm of bladder, unspecified: Secondary | ICD-10-CM

## 2022-10-31 DIAGNOSIS — Z8551 Personal history of malignant neoplasm of bladder: Secondary | ICD-10-CM

## 2022-10-31 MED ORDER — CIPROFLOXACIN HCL 500 MG PO TABS
500.0000 mg | ORAL_TABLET | Freq: Once | ORAL | Status: AC
  Administered 2022-10-31: 500 mg via ORAL

## 2022-10-31 NOTE — Progress Notes (Signed)
History of Present Illness:   4.5.20221-Underwent TURBT/Rt URS/stone mgmt.   Initial TURBT of a 2 cm papillary bladder tumor on 5.22.2017.  Pathology revealed papillary low-grade NMIBC. Epirubicin was placed post resection.   4.5.2021: He underwent cystoscopy, biopsy of papillary lesions at the right ureteral orifice (path--urothelial papilloma), right ureteroscopy with holmium laser and extraction of stone as well as stent placement.  He has since remove the stent.  He has passed multiple small fragments.   7.12.2022: Cystoscopy negative  12.13.2022: Here early for follow-up.  Had 3 to 4 days of total gross painless hematuria which cleared up about a week ago.  No change in urinary pattern, no flank or abdominal symptoms at that time.  His CT scan for hematuria evaluation was negative.   1.30.2023: He underwent cystoscopy, bladder biopsy and TUR of papillary lesions within the prostatic urethra.  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  2.14.2023: He has urgency and frequency since his procedure but no current dysuria or gross hematuria.  His wife and daughter accompany him to the visit today.   3.28.2023: Completed BCG induction   5.9.2023: Cystoscopy negative.  Cytology revealed atypical cells.   6.28.2023: Completed maintenance BCG   8.15.2023: Cystoscopy negative, cytology revealed atypia but no evidence of high-grade urothelial carcinoma  9.26.2023: Completed maintenance BCG.  He tolerated these well.  11.7.2023: Here for cystoscopy.  No recent gross hematuria.  No complaints.     Past Medical History:  Diagnosis Date   Arthritis    DJD right knee   Benign prostatic hypertrophy with urinary frequency    Bladder cancer (HCC)    Complication of anesthesia    gets combative in recovery   COPD with emphysema (HCC)    Depression    Dyspnea    when walking   Dysrhythmia    a fib   History of colon polyps     History of kidney stones    History of loop recorder    loop recorder in place battery is dead has not had changed due to covid   History of pneumonia    hx Recurrent -- last bout Dec 2016 CAP-- per pt resolved   History of TIA (transient ischemic attack) no residual per pt   per neurologist note (dr Leta Baptist 11-08-2015) dx cryptogenic TIA versus arrhythia versus dysautonomia (right ophthalmic artery TIA and x3 posterior circulation TIAs   Hyperlipidemia    Hypertension    Multinodular thyroid    per pathology report 11-12-2014 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   Pulmonary nodule, left    left lower lobe x2 per CT 01-20-2016   Renal cyst, left    Stroke [facility])    TIA's   Thoracic aortic aneurysm without rupture ([place])    ascending -- ct chest 01/10/16  4.8cm   Wears hearing aid    bilateral-- wear at times    Past Surgical History:  Procedure Laterality Date   ANTERIOR CERVICAL DECOMP/DISCECTOMY FUSION N/A 04/03/2017   Procedure: Cervical three-four, Cervical four-five Anterior cervical decompression/discectomy/fusion;  Surgeon: Erline Levine, MD;  Location: [facility];  Service: Neurosurgery;  Laterality: N/A;   CATARACT EXTRACTION W/ INTRAOCULAR  LENS IMPLANT Right 2016   CATARACT EXTRACTION W/PHACO  12/16/2012   Procedure: CATARACT EXTRACTION PHACO AND INTRAOCULAR LENS PLACEMENT (IOC);  Surgeon: Tonny Branch, MD;  Location: [facility];  Service: Ophthalmology;  Laterality: Left;  CDE:17.31   COLONOSCOPY  10/03/2011   Procedure: COLONOSCOPY;  Surgeon: Jamesetta So;  [facility];  Service: Gastroenterology;  Laterality: N/A;   CYSTOSCOPY/RETROGRADE/URETEROSCOPY/STONE EXTRACTION WITH BASKET Right  03/29/2020   Procedure: CYSTOSCOPY/RETROGRADE/URETEROSCOPY/STONE EXTRACTION WITH BASKET;  Surgeon: Franchot Gallo, MD;  Location: [facility];  Service: Urology;  Laterality: Right;  1 HR   DECOMPRESSIOIN ULNAR NERVE AND CUBITAL TUNNEL RELEASE Left 07-14-2009   elbow   EP IMPLANTABLE DEVICE N/A 08/10/2015   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   HOLMIUM LASER APPLICATION Right 0/01/4096   Procedure: HOLMIUM LASER APPLICATION;  Surgeon: Franchot Gallo, MD;  Location: [facility];  Service: Urology;  Laterality: Right;   INGUINAL HERNIA REPAIR Right 1990's   KNEE ARTHROSCOPY Right 2015   LEFT CUBITAL TUNNEL RELEASE     POSTERIOR LUMBAR FUSION  2014   X2   SHOULDER ARTHROSCOPY Left 1990's   TEE WITHOUT CARDIOVERSION N/A 07/27/2015   Procedure: TRANSESOPHAGEAL ECHOCARDIOGRAM (TEE);  Surgeon: Lelon Perla, MD;  Location: [facility] ENDOSCOPY;  Service: Cardiovascular;  Laterality: N/A;   normal LV function, ef 55-60%,  mild AR and MR, mild dilated ascending aorta (4.2cm),  mild to moderate atherosclerosis  descending aorta,  mild to moderate TR,  negative saline microcavitation study   THYROIDECTOMY Right 01/20/2015   Procedure: RIGHT THYROIDECTOMY;  Surgeon: Ascencion Dike, MD;  [facility];  Service: ENT;  Laterality: Right;   TONSILLECTOMY  as child   TOTAL HIP ARTHROPLASTY Left 12/14/2020   Procedure: TOTAL HIP ARTHROPLASTY ANTERIOR APPROACH;  Surgeon: [name], MD;  [facility];  Service: Orthopedics;  Laterality: Left;   TRANSURETHRAL RESECTION OF BLADDER TUMOR N/A 05/15/2016   Procedure: TRANSURETHRAL RESECTION OF BLADDER TUMOR (TURBT) AND INSTILLATION OF EPIRUBICIN;  Surgeon: Franchot Gallo, MD;  Location[facility];  Service: Urology;  Laterality: N/A;   TRANSURETHRAL RESECTION OF BLADDER TUMOR N/A 03/29/2020   Procedure: TRANSURETHRAL RESECTION OF BLADDER TUMOR (TURBT);  Surgeon: Franchot Gallo, MD;  [facility];  Service: Urology;  Laterality:  N/A;   TRANSURETHRAL RESECTION OF BLADDER TUMOR WITH MITOMYCIN-C N/A 01/23/2022   Procedure: TRANSURETHRAL RESECTION OF BLADDER TUMOR WITH POST OPERATIVE GEMCITABINE INSTILLATION;  Surgeon: Franchot Gallo, MD;  Location[facility];  Service: Urology;  Laterality: N/A;    Home Medications:  Allergies as of 10/31/2022       Reactions   Flomax [tamsulosin Hcl] Nausea And Vomiting, Other (See Comments)   "Pt thought he was dying " DIZZY        Medication List        Accurate as of October 31, 2022  1:23 PM. If you have any questions, ask your nurse or doctor.          acetaminophen 500 MG tablet Commonly known as: TYLENOL Take 2 tablets (1,000 mg total) by mouth every 6 (six) hours. What changed:  how much to take when to take this   amoxicillin-clavulanate 875-125 MG tablet Commonly known as: AUGMENTIN Take 1 tablet by mouth every 12 (twelve) hours.   atorvastatin 20 MG tablet Commonly known as: LIPITOR TAKE 1 TABLET BY MOUTH DAILY.   Breztri Aerosphere 160-9-4.8 MCG/ACT Aero Generic drug: Budeson-Glycopyrrol-Formoterol INHALE (2) PUFFS INTO THE LUNGS IN THE MORNING  AND AT BEDTIME   DEEP BLUE RELIEF EX Apply topically.   Eliquis 5 MG Tabs tablet Generic drug: apixaban Take 5 mg by mouth 2 (two) times daily.   finasteride 5 MG tablet Commonly known as: PROSCAR Take 5 mg by mouth daily.   loratadine 10 MG tablet Commonly known as: CLARITIN Take 10 mg by mouth daily.   Magnesium 500 MG Tabs Take 500 mg by mouth daily.   sertraline 50 MG tablet Commonly known as: ZOLOFT Take 50 mg by mouth every morning.   Ventolin HFA 108 (90 Base) MCG/ACT inhaler Generic drug: albuterol Inhale 2 puffs into the lungs Every 4 hours as needed for wheezing or shortness of breath.   verapamil 240 MG CR tablet Commonly known as: CALAN-SR Take 240 mg by mouth daily.   Vitamin D 50 MCG (2000 UT) tablet Take 2,000 Units by mouth daily.        Allergies:  Allergies   Allergen Reactions   Flomax [Tamsulosin Hcl] Nausea And Vomiting and Other (See Comments)    "Pt thought he was dying " DIZZY    Family History  Problem Relation Age of Onset   Stroke Sister    Breast cancer Sister    Stroke Brother     Social History:  reports that he quit smoking about 12 years ago. His smoking use included cigarettes. He has a 45.00 pack-year smoking history. He has never used smokeless tobacco. He reports that he does not drink alcohol and does not use drugs.  ROS: A complete review of systems was performed.  All systems are negative except for pertinent findings as noted.  Physical Exam:  Vital signs in last 24 hours: There were no vitals taken for this visit. Constitutional:  Alert and oriented, No acute distress Cardiovascular: Regular rate  Respiratory: Normal respiratory effort Psychiatric: Normal mood and affect  Cystoscopy Procedure Note:  Indication: Bladder cancer surveillance  After informed consent and discussion of the procedure and its risks, Kyle Owen was positioned and prepped in the standard fashion.  Cystoscopy was performed with a flexible cystoscope.   Findings: Urethra: No stricture or lesion Prostate: Minimally obstructive Bladder neck: No contracture Ureteral orifices: Normal Bladder: Mild trabeculations.  No urothelial abnormalities  The patient tolerated the procedure well.     I have reviewed prior pt notes  I have reviewed urinalysis results  I have independently reviewed prior imaging    Impression/Assessment:  History of urothelial carcinoma of the bladder, high-grade, nonmuscle invasive.  No evidence of recurrence today  Plan:  I will have him come back for his last maintenance BCG in about 6 weeks, I will see him back for cystoscopy in 4 months.

## 2022-11-01 LAB — URINALYSIS, ROUTINE W REFLEX MICROSCOPIC
Bilirubin, UA: NEGATIVE
Glucose, UA: NEGATIVE
Ketones, UA: NEGATIVE
Leukocytes,UA: NEGATIVE
Nitrite, UA: NEGATIVE
Protein,UA: NEGATIVE
RBC, UA: NEGATIVE
Specific Gravity, UA: 1.02 (ref 1.005–1.030)
Urobilinogen, Ur: 0.2 mg/dL (ref 0.2–1.0)
pH, UA: 6 (ref 5.0–7.5)

## 2022-11-02 LAB — CYTOLOGY, URINE

## 2022-11-03 ENCOUNTER — Telehealth: Payer: Self-pay | Admitting: Cardiology

## 2022-11-03 DIAGNOSIS — R911 Solitary pulmonary nodule: Secondary | ICD-10-CM

## 2022-11-03 NOTE — Telephone Encounter (Signed)
Call disconnected. I called back and left message letting patient/wife know that her message was sent to triage and her call will be returned when the RN is available.

## 2022-11-03 NOTE — Telephone Encounter (Signed)
Spoke with pt's wife,DPR and provided CT results and recommendation per Dr Marlou Porch.as below.  Pt's wife verbalizes understanding and agrees with current plan.  Order placed for CT/Chest without contrast.  Jerline Pain, MD 10/24/2022  2:24 PM EDT     Stable aortic aneurysm with no significant change from prior study. There is a 2.2 cm nodular airspace opacity within the upper left lower lobe.  Radiology is recommending a noncontrasted CT in 3 months for follow-up.  Lets go ahead and order. Candee Furbish, MD

## 2022-11-03 NOTE — Telephone Encounter (Signed)
[name] wife is following up. She states she would like to speak with someone before the end of the day.

## 2022-11-03 NOTE — Telephone Encounter (Signed)
Wife returned RN's call regarding test results.

## 2022-11-14 ENCOUNTER — Telehealth: Payer: Self-pay

## 2022-11-14 NOTE — Telephone Encounter (Signed)
-----   Message from Franchot Gallo, MD sent at 11/14/2022  8:09 AM EST ----- Please call patient-no cancer cells seen, just atypical cells which are common after BCG.  Continue scheduled BCG administration  ----- Message ----- From: Audie Box, CMA Sent: 11/02/2022   3:58 PM EST To: Franchot Gallo, MD  Please review.

## 2022-11-14 NOTE — Telephone Encounter (Signed)
Wife aware of MD response to cytology.

## 2022-11-21 ENCOUNTER — Ambulatory Visit: Payer: Medicare Other | Attending: Cardiology | Admitting: Cardiology

## 2022-11-21 ENCOUNTER — Encounter: Payer: Self-pay | Admitting: Cardiology

## 2022-11-21 VITALS — BP 110/70 | HR 66 | Ht 73.0 in | Wt 192.0 lb

## 2022-11-21 DIAGNOSIS — I480 Paroxysmal atrial fibrillation: Secondary | ICD-10-CM | POA: Diagnosis not present

## 2022-11-21 DIAGNOSIS — Z8673 Personal history of transient ischemic attack (TIA), and cerebral infarction without residual deficits: Secondary | ICD-10-CM | POA: Diagnosis not present

## 2022-11-21 DIAGNOSIS — I712 Thoracic aortic aneurysm, without rupture, unspecified: Secondary | ICD-10-CM | POA: Diagnosis not present

## 2022-11-21 NOTE — Progress Notes (Signed)
Cardiology Office Note:    Date:  11/21/2022   ID:  Kyle Owen, DOB 02-05-1941, MRN [id number]  PCP:  Asencion Noble, MD  [facility] Cardiologist:  Candee Furbish, MD  [facility] Electrophysiologist:  None   Referring MD: Asencion Noble, MD    History of Present Illness:    Kyle Owen is a 81 y.o. male here for the follow-up of atrial fibrillation.  Hx of ascending aortic aneurysm 4.7 cm originally noted in 2017 as well as 2021 CT images personally reviewed and interpreted.  Dr. Darcey Nora had been following as well.  Also has paroxysmal atrial fibrillation-on Eliquis.  No major signs of bleeding.  Loop implanted expired battery.  Does not wish to pursue explantation.  He also has been taking atorvastatin with no myalgias.  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    At his last visit on 10/06/2021, he was not feeling very well. He was experiencing pain in his left hip from his previous hip arthroplasty, he was using a cane to walk at that time. He was having difficulty completing his task around the farm despite two rounds of physical therapy. He was going to the gym a few days a week and was able to use the chainsaw in the yard.  was able to using a chainsaw to cut trees in the yard.  He saw Dr. Darcey Nora 05/15/2022 His aortic aneurysm is stable at 4.7 cm, but due to his history of bladder cancer and age he is not a good candidate for surgery. He will be getting a recheck in 18 months.   Today, he is accompanied by his wife. 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 He is careful to make sure he does not put too much strain on his chest while he is doing it. He has been cutting up trees that were cut down with the chain saw. He is still having the issues with aging and not being able to do what he wants to do.   He denies any palpitations, chest pain, shortness of breath, or  peripheral edema. No lightheadedness, headaches, syncope, orthopnea, or PND.    Past Medical History:  Diagnosis Date   Arthritis    DJD right knee   Benign prostatic hypertrophy with urinary frequency    Bladder cancer (HCC)    Complication of anesthesia    gets combative in recovery   COPD with emphysema (HCC)    Depression    Dyspnea    when walking   Dysrhythmia    a fib   History of colon polyps    History of kidney stones    History of loop recorder    loop recorder in place battery is dead has not had changed due to covid   History of pneumonia    hx Recurrent -- last bout Dec 2016 CAP-- per pt resolved   History of TIA (transient ischemic attack) no residual per pt   per neurologist note (dr Leta Baptist 11-08-2015) dx cryptogenic TIA versus arrhythia versus dysautonomia (right ophthalmic artery TIA and x3 posterior circulation TIAs   Hyperlipidemia    Hypertension    Multinodular thyroid    per pathology report 11-12-2014 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   Pulmonary nodule, left    left lower lobe x2 per CT 01-20-2016   Renal cyst, left    Stroke [facility])    TIA's   Thoracic aortic aneurysm without rupture ([place])    ascending -- ct chest 01/10/16  4.8cm   Wears hearing aid    bilateral-- wear at times    Past Surgical History:  Procedure Laterality Date   ANTERIOR CERVICAL DECOMP/DISCECTOMY FUSION N/A 04/03/2017   Procedure: Cervical three-four, Cervical four-five Anterior cervical decompression/discectomy/fusion;  Surgeon: Erline Levine, MD;  Location: [place];  Service: Neurosurgery;  Laterality: N/A;   CATARACT EXTRACTION W/ INTRAOCULAR LENS IMPLANT Right 2016   CATARACT EXTRACTION W/PHACO   12/16/2012   Procedure: CATARACT EXTRACTION PHACO AND INTRAOCULAR LENS PLACEMENT ([place]);  Surgeon: Tonny Branch, MD;  Location: [facility];  Service: Ophthalmology;  Laterality: Left;  CDE:17.31   COLONOSCOPY  10/03/2011   Procedure: COLONOSCOPY;  Surgeon: Jamesetta So;  Location: [facility];  Service: Gastroenterology;  Laterality: N/A;   CYSTOSCOPY/RETROGRADE/URETEROSCOPY/STONE EXTRACTION WITH BASKET Right 03/29/2020   Procedure: CYSTOSCOPY/RETROGRADE/URETEROSCOPY/STONE EXTRACTION WITH BASKET;  Surgeon: Franchot Gallo, MD;  Location: [facility];  Service: Urology;  Laterality: Right;  1 HR   DECOMPRESSIOIN ULNAR NERVE AND CUBITAL TUNNEL RELEASE Left 07-14-2009   elbow   EP IMPLANTABLE DEVICE N/A 08/10/2015   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   HOLMIUM LASER APPLICATION Right 04/03/4495   Procedure: HOLMIUM LASER APPLICATION;  Surgeon: Franchot Gallo, MD;  [facility]: [facility];  Service: Urology;  Laterality: Right;   INGUINAL HERNIA REPAIR Right 1990's   KNEE ARTHROSCOPY Right 2015   LEFT CUBITAL TUNNEL RELEASE     POSTERIOR LUMBAR FUSION  2014   X2   SHOULDER ARTHROSCOPY Left 1990's   TEE WITHOUT CARDIOVERSION N/A 07/27/2015   Procedure: TRANSESOPHAGEAL ECHOCARDIOGRAM (TEE);  Surgeon: Lelon Perla, MD;  Location: [facility] ENDOSCOPY;  Service: Cardiovascular;  Laterality: N/A;   normal LV function, ef 55-60%,  mild AR and MR, mild dilated ascending aorta (4.2cm),  mild to moderate atherosclerosis  descending aorta,  mild to moderate TR,  negative saline microcavitation study   THYROIDECTOMY Right 01/20/2015   Procedure: RIGHT THYROIDECTOMY;  Surgeon: Ascencion Dike, MD;  Location: [facility] OR;  Service: ENT;  Laterality: Right;   TONSILLECTOMY  as child   TOTAL HIP ARTHROPLASTY Left 12/14/2020   Procedure: TOTAL HIP ARTHROPLASTY ANTERIOR APPROACH;  Surgeon: [name], MD;  [facility];  Service: Orthopedics;  Laterality: Left;   TRANSURETHRAL RESECTION OF BLADDER TUMOR N/A  05/15/2016   Procedure: TRANSURETHRAL RESECTION OF BLADDER TUMOR (TURBT) AND INSTILLATION OF EPIRUBICIN;  Surgeon: Franchot Gallo, MD;  Location: [facility];  Service: Urology;  Laterality: N/A;   TRANSURETHRAL RESECTION OF BLADDER TUMOR N/A 03/29/2020   Procedure: TRANSURETHRAL RESECTION OF BLADDER TUMOR (TURBT);  Surgeon: Franchot Gallo, MD;  [facility];  Service: Urology;  Laterality: N/A;   TRANSURETHRAL RESECTION OF BLADDER TUMOR WITH MITOMYCIN-C N/A 01/23/2022   Procedure: TRANSURETHRAL RESECTION OF BLADDER TUMOR WITH POST OPERATIVE GEMCITABINE INSTILLATION;  Surgeon: Franchot Gallo, MD;  [facility];  Service: Urology;  Laterality: N/A;    Current Medications: Current Meds  Medication Sig   acetaminophen (TYLENOL) 500 MG tablet Take 2 tablets (1,000 mg total) by mouth every 6 (six) hours. (  Patient taking differently: Take 500-1,000 mg by mouth daily.)   atorvastatin (LIPITOR) 20 MG tablet TAKE 1 TABLET BY MOUTH DAILY.   Budeson-Glycopyrrol-Formoterol (BREZTRI AEROSPHERE) 160-9-4.8 MCG/ACT AERO INHALE (2) PUFFS INTO THE LUNGS IN THE MORNING AND AT BEDTIME   Cholecalciferol (VITAMIN D) 50 MCG (2000 UT) tablet Take 2,000 Units by mouth daily.   ELIQUIS 5 MG TABS tablet Take 5 mg by mouth 2 (two) times daily.   finasteride (PROSCAR) 5 MG tablet Take 5 mg by mouth daily.    Liniments (DEEP BLUE RELIEF EX) Apply topically.   loratadine (CLARITIN) 10 MG tablet Take 10 mg by mouth daily.   Magnesium 500 MG TABS Take 500 mg by mouth daily.    sertraline (ZOLOFT) 50 MG tablet Take 50 mg by mouth every morning.   VENTOLIN HFA 108 (90 BASE) MCG/ACT inhaler Inhale 2 puffs into the lungs Every 4 hours as needed for wheezing or shortness of breath.    verapamil (CALAN-SR) 240 MG CR tablet Take 240 mg by mouth daily.     Allergies:   Flomax [tamsulosin hcl]   Social History   Socioeconomic History   Marital status: Married    Spouse name: Not on file   Number  of children: 3   Years of education: 14   Highest education level: Not on file  Occupational History   Occupation: Pensions consultant and Production manager    Comment: retired  Tobacco Use   Smoking status: Former    Packs/day: 1.00    Years: 45.00    Total pack years: 45.00    Types: Cigarettes    Quit date: 09/26/2010    Years since quitting: 12.1   Smokeless tobacco: Never  Vaping Use   Vaping Use: Never used  Substance and Sexual Activity   Alcohol use: No   Drug use: Never   Sexual activity: Not Currently  Other Topics Concern   Not on file  Social History Narrative   Widowed, lives with dog, [place] in [place], wife passed from Parkinson's disease 3 1/2 yrs ago   1 cup coffee daily   Social Determinants of Health   Financial Resource Strain: Not on file  Food Insecurity: Not on file  Transportation Needs: Not on file  Physical Activity: Not on file  Stress: Not on file  Social Connections: Not on file     Family History: The patient's family history includes Breast cancer in his sister; Stroke in his brother and sister.  ROS:   Please see the history of present illness.    (+) Left hip pain All other systems reviewed and are negative.  EKGs/Labs/Other Studies Reviewed:    CT Angio Chest/Aorta 10/26/2020: COMPARISON:  09/17/2019   FINDINGS: Cardiovascular: 4.7 cm ascending thoracic aortic aneurysm again noted, unchanged. Distal aortic arch mildly aneurysmal at 4 cm, stable. Proximal descending thoracic aorta 3 cm. No dissection. Heart is normal size. Coronary artery and aortic calcifications.   Mediastinum/Nodes: No mediastinal, hilar, or axillary adenopathy. Trachea and esophagus are unremarkable. Presumed prior right thyroidectomy. No significant findings within the left thyroid lobe.   Lungs/Pleura: Calcified granuloma in the right upper lobe. Linear scarring in the lung bases. No effusions.   Upper Abdomen: Imaging into the upper abdomen demonstrates no acute findings.  Calcifications in the spleen and liver compatible with old granulomatous disease.   Musculoskeletal: Chest wall soft tissues are unremarkable. No acute bony abnormality.   Review of the MIP images confirms the above findings.   IMPRESSION: Stable 4.7  cm ascending thoracic aortic aneurysm.   Distal aortic arch aneurysmal at 4 cm, stable.   Coronary artery disease.   No acute cardiopulmonary disease.   Old granulomatous disease.   Aortic Atherosclerosis (ICD10-I70.0).  Lexiscan Myoview 06/08/2017: Nuclear stress EF: 66%. There was no ST segment deviation noted during stress. The study is normal. This is a low risk study. The left ventricular ejection fraction is hyperdynamic (>65%).   Thinning of the inferior wall at mid and basal level thought to be from extra cardiac /diaphragmatic attenuation No ischemia and no RWMA;s EF 66%  EKG:  EKG is personally reviewed and interpreted. 10/06/2021: Sinus bradycardia. Rate 54 bpm.  10/04/2020:The ekg ordered today demonstrates sinus rhythm 63 no other abnormalities  Recent Labs: 09/08/2022: B Natriuretic Peptide 158.0; Hemoglobin 13.1; Platelets 202 10/16/2022: BUN 30; Creatinine, Ser 1.58; Potassium 4.8; Sodium 143   Recent Lipid Panel    Component Value Date/Time   CHOL 225 (H) 06/09/2015 1040   TRIG 100 06/09/2015 1040   HDL 68 06/09/2015 1040   CHOLHDL 3.3 06/09/2015 1040   LDLCALC 137 (H) 06/09/2015 1040     Risk Assessment/Calculations:     CHA2DS2-VASc Score =    This indicates a  % annual risk of stroke. The patient's score is based upon:        Physical Exam:    VS:  BP 110/70 (BP Location: Left Arm, Patient Position: Sitting, Cuff Size: Normal)   Pulse 66   Ht '6\' 1"'$  (1.854 m)   Wt 192 lb (87.1 kg)   BMI 25.33 kg/m     Wt Readings from Last 3 Encounters:  11/21/22 192 lb (87.1 kg)  10/31/22 194 lb (88 kg)  09/08/22 194 lb (88 kg)     GEN:  Well nourished, well developed in no acute distress,  utilizes a cane HEENT: Normal NECK: No JVD; No carotid bruits LYMPHATICS: No lymphadenopathy CARDIAC: RRR, no murmurs, rubs, gallops RESPIRATORY:  Clear to auscultation without rales, wheezing or rhonchi  ABDOMEN: Soft, non-tender, non-distended MUSCULOSKELETAL:  No edema; No deformity  SKIN: Warm and dry NEUROLOGIC:  Alert and oriented x 3 PSYCHIATRIC:  Normal affect   ASSESSMENT:    1. Thoracic aortic aneurysm without rupture, unspecified part ([place])   2. History of stroke   3. Paroxysmal atrial fibrillation (HCC)      PLAN:    In order of problems listed above:  Thoracic ascending aortic aneurysm (HCC) 4.7 cm on 2021 November, 10/24/2022 4.7 cm as well.  Stable CT scan.  Note from Dr. Darcey Nora of cardiothoracic surgery reviewed.  He is going to space out his CT scan for 18 months.  He would not be a surgical candidate given his age as well as bladder cancer.  Discussed again with family.   Paroxysmal atrial fibrillation (HCC) Currently on Eliquis for stroke prevention.  Has a history of cryptogenic stroke.  He had about 3% burden on loop recorder previously.  Dr. Rayann Heman had been monitoring.  Continue with verapamil 240 mg CR daily.  Doing well.  Be careful with chores around the farm.   Loop recorder is at end-of-life.  I asked him if he would like to get it removed.  He would hold off on this at this time.   Chronic anticoagulation Proceeding with Eliquis.  No bleeding.  No change in medical management.  Continue to monitor hemoglobin and creatinine.  Continue.  Last hemoglobin 13.1 creatinine 1.5   History of stroke Cryptogenic stroke.  Atrial fibrillation  was discovered at that time.  Disequilibrium.  For instance does not like getting on a boat.  Overall been fairly well.   Bladder cancer [facility]) Dr. Diona Fanti has been monitoring, undergoing BCG soon.     Follow-up:  1 year  Medication Adjustments/Labs and Tests Ordered: Current medicines are reviewed at length with  the patient today.  Concerns regarding medicines are outlined above.  No orders of the defined types were placed in this encounter.   No orders of the defined types were placed in this encounter.    Patient Instructions  Medication Instructions:  Your physician recommends that you continue on your current medications as directed. Please refer to the Current Medication list given to you today.  *If you need a refill on your cardiac medications before your next appointment, please call your pharmacy*   Lab Work: None. If you have labs (blood work) drawn today and your tests are completely normal, you will receive your results only by: [place] (if you have MyChart) OR A paper copy in the mail If you have any lab test that is abnormal or we need to change your treatment, we will call you to review the results.   Testing/Procedures: None.   Follow-Up: At [facility], you and your health needs are our priority.  As part of our continuing mission to provide you with exceptional heart care, we have created designated Provider Care Teams.  These Care Teams include your primary Cardiologist (physician) and Advanced Practice Providers (APPs -  Physician Assistants and Nurse Practitioners) who all work together to provide you with the care you need, when you need it.  We recommend signing up for the patient portal called "MyChart".  Sign up information is provided on this After Visit Summary.  MyChart is used to connect with patients for Virtual Visits (Telemedicine).  Patients are able to view lab/test results, encounter notes, upcoming appointments, etc.  Non-urgent messages can be sent to your provider as well.   To learn more about what you can do with MyChart, go to [email or web address].    Your next appointment:   1 year(s)  The format for your next appointment:   In Person  Provider:   Candee Furbish, MD      Important Information About [place],acting as a scribe for Candee Furbish, MD.,have documented all relevant documentation on the behalf of Candee Furbish, MD,as directed by  Candee Furbish, MD while in the presence of Candee Furbish, MD.   I, Candee Furbish, MD, have reviewed all documentation for this visit. The documentation on 11/21/22 for the exam, diagnosis, procedures, and orders are all accurate and complete.   Signed, Candee Furbish, MD  11/21/2022 1:20 PM    [facility]

## 2022-11-21 NOTE — Patient Instructions (Signed)
Medication Instructions:  Your physician recommends that you continue on your current medications as directed. Please refer to the Current Medication list given to you today.  *If you need a refill on your cardiac medications before your next appointment, please call your pharmacy*   Lab Work: None. If you have labs (blood work) drawn today and your tests are completely normal, you will receive your results only by: [place] (if you have MyChart) OR A paper copy in the mail If you have any lab test that is abnormal or we need to change your treatment, we will call you to review the results.   Testing/Procedures: None.   Follow-Up: At [facility], you and your health needs are our priority.  As part of our continuing mission to provide you with exceptional heart care, we have created designated Provider Care Teams.  These Care Teams include your primary Cardiologist (physician) and Advanced Practice Providers (APPs -  Physician Assistants and Nurse Practitioners) who all work together to provide you with the care you need, when you need it.  We recommend signing up for the patient portal called "MyChart".  Sign up information is provided on this After Visit Summary.  MyChart is used to connect with patients for Virtual Visits (Telemedicine).  Patients are able to view lab/test results, encounter notes, upcoming appointments, etc.  Non-urgent messages can be sent to your provider as well.   To learn more about what you can do with MyChart, go to [email or web address].    Your next appointment:   1 year(s)  The format for your next appointment:   In Person  Provider:   Candee Furbish, MD      Important Information About Sugar

## 2022-12-05 ENCOUNTER — Ambulatory Visit (INDEPENDENT_AMBULATORY_CARE_PROVIDER_SITE_OTHER): Payer: Medicare Other | Admitting: Pulmonary Disease

## 2022-12-05 ENCOUNTER — Encounter: Payer: Self-pay | Admitting: Pulmonary Disease

## 2022-12-05 VITALS — BP 124/72 | HR 60 | Ht 73.0 in | Wt 192.4 lb

## 2022-12-05 DIAGNOSIS — J9611 Chronic respiratory failure with hypoxia: Secondary | ICD-10-CM | POA: Diagnosis not present

## 2022-12-05 DIAGNOSIS — J4489 Other specified chronic obstructive pulmonary disease: Secondary | ICD-10-CM

## 2022-12-05 DIAGNOSIS — J439 Emphysema, unspecified: Secondary | ICD-10-CM

## 2022-12-05 NOTE — Assessment & Plan Note (Signed)
Continue Breztri. Use albuterol for rescue. We discussed COPD action plan and signs and symptoms of COPD exacerbation 

## 2022-12-05 NOTE — Patient Instructions (Signed)
  X Amb sat

## 2022-12-05 NOTE — Assessment & Plan Note (Signed)
Continue to use oxygen during sleep. He does not qualify for daytime oxygen

## 2022-12-05 NOTE — Progress Notes (Signed)
   Subjective:    Patient ID: Kyle Owen, male    DOB: 30-Jan-1941, 80 y.o.   MRN: [id number]  HPI  81 yo ex-smoker for follow-up of COPD and chronic hypoxic respiratory failure -Started on nocturnal oxygen 03/2021 -wife Kyle Owen    PMH -cryptogenic TIA, atrial fibrillation on Eliquis, bladder cancer , thoracic aortic aneurysm   Chief Complaint  Patient presents with   Follow-up    Pt f/u he states his breathing is baseline w/ SOB, coughing and wheezing   Accompanied by his wife who corroborates history. His wife called in September, he was more hypoxic, taken to the ED, oxygen saturation was 85%, chest x-ray showed left lower lobe pneumonia, he was given Augmentin. She is concerned that our office did not return her call timely and would like to develop an action plan should he develop symptoms in the future. We reviewed CT angiogram chest which showed stable aneurysm and residual left lower lobe infiltrate  His breathing is now back to baseline, he is compliant with Breztri. He continues to use oxygen during sleep, nasal cannula falls off and he wonders if a mask would help him better  He was able to ambulate 1 lap and his oxygen saturation stayed at 94%  Significant tests/ events reviewed  03/2021 ONO shows desaturation less than 88% for 3 hours 46 minutes >> 2 L during sleep  CTA aorta 09/2022 >> stable aneurysm, new 2.2 cm LLL ? infectious  CTA chest 04/2022 stable aneurysm  CT angiogram chest 10/2020 -stable 4.7 cm aneurysm ?  Left basal bronchiectasis   CT chest 08/2019  4.7 cm ascending aortic aneurysm .  Stable calcified granuloma right apex     PFTs 10/2020 moderate airway obstruction, ratio 62, FEV1 1.83/58%, FVC 67%, TLC normal, DLCO 22.8/88%  Review of Systems neg for any significant sore throat, dysphagia, itching, sneezing, nasal congestion or excess/ purulent secretions, fever, chills, sweats, unintended wt loss, pleuritic or exertional cp, hempoptysis,  orthopnea pnd or change in chronic leg swelling. Also denies presyncope, palpitations, heartburn, abdominal pain, nausea, vomiting, diarrhea or change in bowel or urinary habits, dysuria,hematuria, rash, arthralgias, visual complaints, headache, numbness weakness or ataxia.     Objective:   Physical Exam   Gen. Pleasant, well-nourished, in no distress ENT - no thrush, no pallor/icterus,no post nasal drip Neck: No JVD, no thyromegaly, no carotid bruits Lungs: no use of accessory muscles, no dullness to percussion, decreased BL without rales or rhonchi  Cardiovascular: Rhythm regular, heart sounds  normal, no murmurs or gallops, no peripheral edema Musculoskeletal: No deformities, no cyanosis or clubbing         Assessment & Plan:

## 2022-12-12 ENCOUNTER — Other Ambulatory Visit: Payer: Medicare Other | Admitting: Urology

## 2023-01-01 ENCOUNTER — Ambulatory Visit (INDEPENDENT_AMBULATORY_CARE_PROVIDER_SITE_OTHER): Payer: Medicare Other | Admitting: Urology

## 2023-01-01 DIAGNOSIS — C679 Malignant neoplasm of bladder, unspecified: Secondary | ICD-10-CM

## 2023-01-01 LAB — URINALYSIS, ROUTINE W REFLEX MICROSCOPIC
Bilirubin, UA: NEGATIVE
Glucose, UA: NEGATIVE
Ketones, UA: NEGATIVE
Leukocytes,UA: NEGATIVE
Nitrite, UA: NEGATIVE
Protein,UA: NEGATIVE
RBC, UA: NEGATIVE
Specific Gravity, UA: 1.02 (ref 1.005–1.030)
Urobilinogen, Ur: 0.2 mg/dL (ref 0.2–1.0)
pH, UA: 6 (ref 5.0–7.5)

## 2023-01-01 MED ORDER — BCG LIVE 50 MG IS SUSR
3.2400 mL | Freq: Once | INTRAVESICAL | Status: AC
  Administered 2023-01-01: 81 mg via INTRAVESICAL

## 2023-01-01 NOTE — Progress Notes (Cosign Needed Addendum)
BCG Bladder Instillation  BCG # 1 of 3  Due to Bladder Cancer patient is present today for a BCG treatment. Patient was cleaned and prepped in a sterile fashion with betadine. A 14FR catheter was inserted, urine return was noted 76m, urine was yellow in color.  561mof reconstituted BCG was instilled into the bladder. The catheter was then removed. Patient tolerated well, no complications were noted  Performed by: KoLevi AlandCMA  Follow up/ Additional notes: Follow up as scheduled.

## 2023-01-08 ENCOUNTER — Ambulatory Visit (INDEPENDENT_AMBULATORY_CARE_PROVIDER_SITE_OTHER): Payer: Medicare Other | Admitting: Urology

## 2023-01-08 DIAGNOSIS — C679 Malignant neoplasm of bladder, unspecified: Secondary | ICD-10-CM

## 2023-01-08 LAB — URINALYSIS, ROUTINE W REFLEX MICROSCOPIC
Bilirubin, UA: NEGATIVE
Glucose, UA: NEGATIVE
Ketones, UA: NEGATIVE
Leukocytes,UA: NEGATIVE
Nitrite, UA: NEGATIVE
Protein,UA: NEGATIVE
RBC, UA: NEGATIVE
Specific Gravity, UA: 1.025 (ref 1.005–1.030)
Urobilinogen, Ur: 0.2 mg/dL (ref 0.2–1.0)
pH, UA: 5.5 (ref 5.0–7.5)

## 2023-01-08 MED ORDER — BCG LIVE 50 MG IS SUSR
3.2400 mL | Freq: Once | INTRAVESICAL | Status: AC
  Administered 2023-01-08: 81 mg via INTRAVESICAL

## 2023-01-08 NOTE — Progress Notes (Signed)
BCG Bladder Instillation  BCG # 2 of 3  Due to Bladder Cancer patient is present today for a BCG treatment. Patient was cleaned and prepped in a sterile fashion with betadine. A 14FR catheter was inserted, urine return was noted 63m, urine was yellow in color.  550mof reconstituted BCG was instilled into the bladder. The catheter was then removed. Patient tolerated well, no complications were noted  Performed by: ShMarisue BrooklynMA  Follow up/ Additional notes: Keep next NV

## 2023-01-15 ENCOUNTER — Ambulatory Visit (INDEPENDENT_AMBULATORY_CARE_PROVIDER_SITE_OTHER): Payer: Medicare Other | Admitting: Urology

## 2023-01-15 DIAGNOSIS — C679 Malignant neoplasm of bladder, unspecified: Secondary | ICD-10-CM | POA: Diagnosis not present

## 2023-01-15 LAB — URINALYSIS, ROUTINE W REFLEX MICROSCOPIC
Bilirubin, UA: NEGATIVE
Glucose, UA: NEGATIVE
Ketones, UA: NEGATIVE
Leukocytes,UA: NEGATIVE
Nitrite, UA: NEGATIVE
Protein,UA: NEGATIVE
RBC, UA: NEGATIVE
Specific Gravity, UA: 1.02 (ref 1.005–1.030)
Urobilinogen, Ur: 0.2 mg/dL (ref 0.2–1.0)
pH, UA: 5 (ref 5.0–7.5)

## 2023-01-15 MED ORDER — BCG LIVE 50 MG IS SUSR
3.2400 mL | Freq: Once | INTRAVESICAL | Status: AC
  Administered 2023-01-15: 81 mg via INTRAVESICAL

## 2023-01-15 NOTE — Progress Notes (Signed)
BCG Bladder Instillation  BCG # 3 of 3  Due to Bladder Cancer patient is present today for a BCG treatment. Patient was cleaned and prepped in a sterile fashion with betadine. A 14FR catheter was inserted, urine return was noted 41m, urine was yellow in color.  541mof reconstituted BCG was instilled into the bladder. The catheter was then removed. Patient tolerated well, no complications were noted  Performed by: KoLevi AlandCMA  Follow up/ Additional notes: Follow up as scheduled for Cysto.

## 2023-01-23 ENCOUNTER — Telehealth: Payer: Self-pay | Admitting: Pulmonary Disease

## 2023-01-23 MED ORDER — BUDESONIDE-FORMOTEROL FUMARATE 160-4.5 MCG/ACT IN AERO: 2.0000 | INHALATION_SPRAY | Freq: Two times a day (BID) | RESPIRATORY_TRACT | 3 refills | Status: DC

## 2023-01-23 NOTE — Telephone Encounter (Signed)
Called and spoke with patients wife. She states they want to change from [facility] back to Symbicort because the breztri is too expensive. Would like a 90 day rx sent to [facility]   Dr. Elsworth Soho please advise, are you ok with patient changing to symbicort and also what dosage would you like patient to be on.

## 2023-01-23 NOTE — Telephone Encounter (Signed)
Called and spoke to [place].  Notified her of new inhaler and instructions.  She voiced understanding.  Phyllis asked for coupon cards but notified her that we do not have any since we don't carry Symbicort samples.  They are going to try to fill new rx but if they can't then they are going to call back for a breztri sample.  Nothing further needed at this time

## 2023-01-23 NOTE — Telephone Encounter (Signed)
ATC patients wife back and there was no response when phone was answered.

## 2023-01-23 NOTE — Telephone Encounter (Signed)
PT states they need to go back to their other inhaler, Symbicort,. The Brezrti is too expensive. Pls call wife @ [phone]   Pharm " [facility]

## 2023-01-24 ENCOUNTER — Ambulatory Visit (HOSPITAL_COMMUNITY)
Admission: RE | Admit: 2023-01-24 | Discharge: 2023-01-24 | Disposition: A | Discharge status: Home / Self Care | Payer: Medicare Other | Source: Ambulatory Visit | Attending: Cardiology | Admitting: Cardiology

## 2023-01-24 DIAGNOSIS — R911 Solitary pulmonary nodule: Secondary | ICD-10-CM | POA: Insufficient documentation

## 2023-02-26 NOTE — Progress Notes (Signed)
History of Present Illness:   4.5.20221-Underwent TURBT/Rt URS/stone mgmt.   Initial TURBT of a 2 cm papillary bladder tumor on 5.22.2017.  Pathology revealed papillary low-grade NMIBC. Epirubicin was placed post resection.   4.5.2021: He underwent cystoscopy, biopsy of papillary lesions at the right ureteral orifice (path--urothelial papilloma), right ureteroscopy with holmium laser and extraction of stone as well as stent placement.  He has since remove the stent.  He has passed multiple small fragments.   7.12.2022: Cystoscopy negative  12.13.2022: Here early for follow-up.  Had 3 to 4 days of total gross painless hematuria which cleared up about a week ago.  No change in urinary pattern, no flank or abdominal symptoms at that time.  His CT scan for hematuria evaluation was negative.   1.30.2023: He underwent cystoscopy, bladder biopsy and TUR of papillary lesions within the prostatic urethra.  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  2.14.2023: He has urgency and frequency since his procedure but no current dysuria or gross hematuria.  His wife and daughter accompany him to the visit today.   3.28.2023: Completed BCG induction   5.9.2023: Cystoscopy negative.  Cytology revealed atypical cells.   6.28.2023: Completed maintenance BCG   8.15.2023: Cystoscopy negative, cytology revealed atypia but no evidence of high-grade urothelial carcinoma   9.26.2023: Completed maintenance BCG.  He tolerated these well.   11.7.2023: Cysto negative. Cytology--atypical cells.  1.22.2024: Completed maintenance BCG.  3.5.2024:  Past Medical History:  Diagnosis Date   Arthritis    DJD right knee   Benign prostatic hypertrophy with urinary frequency    Bladder cancer (HCC)    Complication of anesthesia    gets combative in recovery   COPD with emphysema (HCC)    Depression    Dyspnea    when walking   Dysrhythmia    a fib    History of colon polyps    History of kidney stones    History of loop recorder    loop recorder in place battery is dead has not had changed due to covid   History of pneumonia    hx Recurrent -- last bout Dec 2016 CAP-- per pt resolved   History of TIA (transient ischemic attack) no residual per pt   per neurologist note (dr Leta Baptist 11-08-2015) dx cryptogenic TIA versus arrhythia versus dysautonomia (right ophthalmic artery TIA and x3 posterior circulation TIAs   Hyperlipidemia    Hypertension    Multinodular thyroid    per pathology report 11-12-2014 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   Pulmonary nodule, left    left lower lobe x2 per CT 01-20-2016   Renal cyst, left    Stroke [facility]    TIA's   Thoracic aortic aneurysm without rupture ([place])    ascending -- ct chest 01/10/16  4.8cm   Wears hearing aid    bilateral-- wear at times    Past Surgical History:  Procedure Laterality Date   ANTERIOR CERVICAL DECOMP/DISCECTOMY FUSION N/A 04/03/2017   Procedure: Cervical three-four, Cervical four-five Anterior cervical decompression/discectomy/fusion;  Surgeon: Erline Levine, MD;  Location: [place];  Service: Neurosurgery;  Laterality: N/A;   CATARACT EXTRACTION W/ INTRAOCULAR LENS  IMPLANT Right 2016   CATARACT EXTRACTION W/PHACO  12/16/2012   Procedure: CATARACT EXTRACTION PHACO AND INTRAOCULAR LENS PLACEMENT (IOC);  Surgeon: Tonny Branch, MD;  [facility]: [facility];  Service: Ophthalmology;  Laterality: Left;  CDE:17.31   COLONOSCOPY  10/03/2011   Procedure: COLONOSCOPY;  Surgeon: Jamesetta So;  [facility];  Service: Gastroenterology;  Laterality: N/A;   CYSTOSCOPY/RETROGRADE/URETEROSCOPY/STONE  EXTRACTION WITH BASKET Right 03/29/2020   Procedure: CYSTOSCOPY/RETROGRADE/URETEROSCOPY/STONE EXTRACTION WITH BASKET;  Surgeon: Franchot Gallo, MD;  Location: [facility];  Service: Urology;  Laterality: Right;  1 HR   DECOMPRESSIOIN ULNAR NERVE AND CUBITAL TUNNEL RELEASE Left 07-14-2009   elbow   EP IMPLANTABLE DEVICE N/A 08/10/2015   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   HOLMIUM LASER APPLICATION Right [id number]   Procedure: HOLMIUM LASER APPLICATION;  Surgeon: Franchot Gallo, MD;  [facility];  Service: Urology;  Laterality: Right;   INGUINAL HERNIA REPAIR Right 1990's   KNEE ARTHROSCOPY Right 2015   LEFT CUBITAL TUNNEL RELEASE     POSTERIOR LUMBAR FUSION  2014   X2   SHOULDER ARTHROSCOPY Left 1990's   TEE WITHOUT CARDIOVERSION N/A 07/27/2015   Procedure: TRANSESOPHAGEAL ECHOCARDIOGRAM (TEE);  Surgeon: Lelon Perla, MD;  Location: [facility] END[facility]COPY;  Service: Cardiovascular;  Laterality: N/A;   normal LV function, ef 55-60%,  mild AR and MR, mild dilated ascending aorta (4.2cm),  mild to moderate atherosclerosis  descending aorta,  mild to moderate TR,  negative saline microcavitation study   THYROIDECTOMY Right 01/20/2015   Procedure: RIGHT THYROIDECTOMY;  Surgeon: Ascencion Dike, MD;  Location: [facility];  Service: ENT;  Laterality: Right;   TONSILLECTOMY  as child   TOTAL HIP ARTHROPLASTY Left 12/14/2020   Procedure: TOTAL HIP ARTHROPLASTY ANTERIOR APPROACH;  Surgeon: [name], MD;  [facility];  Service: Orthopedics;  Laterality: Left;   TRANSURETHRAL RESECTION OF BLADDER TUMOR N/A 05/15/2016   Procedure: TRANSURETHRAL RESECTION OF BLADDER TUMOR (TURBT) AND INSTILLATION OF EPIRUBICIN;  Surgeon: Franchot Gallo, MD;  Location: [facility];  Service: Urology;  Laterality: N/A;   TRANSURETHRAL RESECTION OF BLADDER TUMOR N/A 03/29/2020   Procedure: TRANSURETHRAL RESECTION OF BLADDER TUMOR (TURBT);  Surgeon: Franchot Gallo, MD;  [facility];   Service: Urology;  Laterality: N/A;   TRANSURETHRAL RESECTION OF BLADDER TUMOR WITH MITOMYCIN-C N/A 01/23/2022   Procedure: TRANSURETHRAL RESECTION OF BLADDER TUMOR WITH POST OPERATIVE GEMCITABINE INSTILLATION;  Surgeon: Franchot Gallo, MD;  [facility];  Service: Urology;  Laterality: N/A;    Home Medications:  Allergies as of 02/27/2023       Reactions   Flomax [tamsulosin Hcl] Nausea And Vomiting, Other (See Comments)   "Pt thought he was dying " DIZZY        Medication List        Accurate as of February 26, 2023  5:45 PM. If you have any questions, ask your nurse or doctor.          acetaminophen 500 MG tablet Commonly known as: TYLENOL Take 2 tablets (1,000 mg total) by mouth every 6 (six) hours. What changed:  how much to take when to take this   atorvastatin 20 MG tablet Commonly known as: LIPITOR TAKE 1 TABLET BY MOUTH DAILY.   budesonide-formoterol 160-4.5 MCG/ACT inhaler Commonly known as: Symbicort Inhale 2 puffs into the lungs 2 (two) times daily.   DEEP BLUE RELIEF EX Apply topically.   Eliquis 5 MG Tabs tablet Generic drug: apixaban Take  5 mg by mouth 2 (two) times daily.   finasteride 5 MG tablet Commonly known as: PROSCAR Take 5 mg by mouth daily.   loratadine 10 MG tablet Commonly known as: CLARITIN Take 10 mg by mouth daily.   Magnesium 500 MG Tabs Take 500 mg by mouth daily.   sertraline 50 MG tablet Commonly known as: ZOLOFT Take 50 mg by mouth every morning.   Ventolin HFA 108 (90 Base) MCG/ACT inhaler Generic drug: albuterol Inhale 2 puffs into the lungs Every 4 hours as needed for wheezing or shortness of breath.   verapamil 240 MG CR tablet Commonly known as: CALAN-SR Take 240 mg by mouth daily.   Vitamin D 50 MCG (2000 UT) tablet Take 2,000 Units by mouth daily.        Allergies:  Allergies  Allergen Reactions   Flomax [Tamsulosin Hcl] Nausea And Vomiting and Other (See Comments)    "Pt thought he was dying  " DIZZY    Family History  Problem Relation Age of Onset   Stroke Sister    Breast cancer Sister    Stroke Brother     Social History:  reports that he quit smoking about 12 years ago. His smoking use included cigarettes. He has a 45.00 pack-year smoking history. He has never used smokeless tobacco. He reports that he does not drink alcohol and does not use drugs.  ROS: A complete review of systems was performed.  All systems are negative except for pertinent findings as noted.  Physical Exam:  Vital signs in last 24 hours: There were no vitals taken for this visit. Constitutional:  Alert and oriented, No acute distress Cardiovascular: Regular rate  Respiratory: Normal respiratory effort GI: Abdomen is soft, nontender, nondistended, no abdominal masses. No CVAT.  Genitourinary: Normal male phallus, testes are descended bilaterally and non-tender and without masses, scrotum is normal in appearance without lesions or masses, perineum is normal on inspection. Lymphatic: No lymphadenopathy Neurologic: Grossly intact, no focal deficits Psychiatric: Normal mood and affect  Cystoscopy Procedure Note:  Indication: Bladder cancer surveillance  After informed consent and discussion of the procedure and its risks, ALVIS LUBA was positioned and prepped in the standard fashion.  Cystoscopy was performed with a flexible cystoscope.   Findings: Urethra:*** Prostate:*** Bladder neck:*** Ureteral orifices:*** Bladder:***  The patient tolerated the procedure well.     I have reviewed prior pt notes  I have reviewed notes from referring/previous physicians  I have reviewed urinalysis results  I have independently reviewed prior imaging  I have reviewed prior PSA results  I have reviewed prior urine culture   Impression/Assessment:  ***  Plan:  ***

## 2023-02-27 ENCOUNTER — Encounter: Payer: Self-pay | Admitting: *Deleted

## 2023-02-27 ENCOUNTER — Ambulatory Visit (INDEPENDENT_AMBULATORY_CARE_PROVIDER_SITE_OTHER): Payer: Medicare Other | Admitting: Urology

## 2023-02-27 ENCOUNTER — Encounter: Payer: Self-pay | Admitting: Urology

## 2023-02-27 VITALS — BP 126/65 | HR 76

## 2023-02-27 DIAGNOSIS — Z8551 Personal history of malignant neoplasm of bladder: Secondary | ICD-10-CM

## 2023-02-27 DIAGNOSIS — C678 Malignant neoplasm of overlapping sites of bladder: Secondary | ICD-10-CM

## 2023-02-27 LAB — URINALYSIS, ROUTINE W REFLEX MICROSCOPIC
Bilirubin, UA: NEGATIVE
Glucose, UA: NEGATIVE
Ketones, UA: NEGATIVE
Leukocytes,UA: NEGATIVE
Nitrite, UA: NEGATIVE
Protein,UA: NEGATIVE
RBC, UA: NEGATIVE
Specific Gravity, UA: 1.02 (ref 1.005–1.030)
Urobilinogen, Ur: 0.2 mg/dL (ref 0.2–1.0)
pH, UA: 5 (ref 5.0–7.5)

## 2023-02-27 MED ORDER — CIPROFLOXACIN HCL 500 MG PO TABS
500.0000 mg | ORAL_TABLET | Freq: Once | ORAL | Status: AC
  Administered 2023-02-27: 500 mg via ORAL

## 2023-03-01 LAB — CYTOLOGY - NON PAP

## 2023-03-05 ENCOUNTER — Telehealth: Payer: Self-pay

## 2023-03-05 NOTE — Telephone Encounter (Signed)
Received call from [place] at [facility] on abnormal Cytology.

## 2023-03-08 ENCOUNTER — Telehealth: Payer: Self-pay

## 2023-03-08 NOTE — Telephone Encounter (Signed)
See below

## 2023-03-08 NOTE — Telephone Encounter (Signed)
[name] wife came by to let Dr. Diona Fanti know patient wants to move forward with procedure - scope of bladder.  [facility] request procedure.  Call wife -Despina Pole [phone]

## 2023-03-09 NOTE — Telephone Encounter (Signed)
Open in error

## 2023-03-13 ENCOUNTER — Telehealth: Payer: Self-pay

## 2023-03-13 NOTE — Telephone Encounter (Signed)
[name] wife returned to office. Wanted to know the status of moving forward with procedure. Has tried to call and leave messages with no return call.  Message from 03-08-23 10:am Dr. Diona Fanti know patient wants to move forward with procedure - scope of bladder.   [facility] request procedure.   Call wife -Despina Pole [phone]

## 2023-03-13 NOTE — Telephone Encounter (Signed)
I called patient wife today, Phillis. Wife is concerned after receiving a call from Dr. Diona Fanti for urine cytology results.   Patient wife reports she would like to proceed with surgery at [facility] and concerned after not hearing back to discuss surgery dates.  Dr. Diona Fanti will complete surgery at [facility] and informed patients wife this would be scheduled from his [facility] location. I did provider wife with [place] phone number for patient and/or wife to call to discuss surgery details.

## 2023-03-14 NOTE — Telephone Encounter (Signed)
I spoke with wife yesterday afternoon and informed patient wife Dr. Diona Fanti would sending surgery posting to [facility] scheduler for surgery.  Wife voiced understanding.

## 2023-03-15 ENCOUNTER — Other Ambulatory Visit: Payer: Self-pay | Admitting: Urology

## 2023-04-05 ENCOUNTER — Encounter (HOSPITAL_BASED_OUTPATIENT_CLINIC_OR_DEPARTMENT_OTHER): Payer: Self-pay | Admitting: Urology

## 2023-04-05 ENCOUNTER — Telehealth: Payer: Self-pay | Admitting: Cardiology

## 2023-04-05 NOTE — Telephone Encounter (Signed)
   Pre-operative Risk Assessment    Patient Name: Kyle Owen  DOB: 24-Mar-1941 MRN: [id number]      Request for Surgical Clearance    Procedure:   Cystoscopy with bladder biopsy   Date of Surgery:  Clearance 04/12/23                                 Surgeon:  Dr. Retta Diones  Surgeon's Group or Practice Name:  [facility]  Phone number:  [phone] ext [phone]  Fax number:  [phone]    Type of Clearance Requested:   Pharmacy only : Eliquis 48 hours prior   Type of Anesthesia:  General    Additional requests/questions:    Alben Spittle   04/05/2023, 12:56 PM

## 2023-04-06 ENCOUNTER — Telehealth: Payer: Self-pay

## 2023-04-06 NOTE — Telephone Encounter (Signed)
Spoke with patient's wife (DPR on file) who is agreeable for patient to do a tele visit on 4/15 at 11 am. Med rec and consent have been done.

## 2023-04-06 NOTE — Telephone Encounter (Signed)
   Name: Kyle Owen  DOB: 1941-09-04  MRN: [id number]  Primary Cardiologist: Donato Schultz, MD   Preoperative team, please contact this patient and set up a phone call appointment for further preoperative risk assessment. Please obtain consent and complete medication review. Thank you for your help.  I confirm that guidance regarding antiplatelet and oral anticoagulation therapy has been completed and, if necessary, noted below. Per pharm D: Patient with diagnosis of afib on Eliquis for anticoagulation.     Procedure: Cystoscopy with bladder biopsy  Date of procedure: 04/12/23   Prior cryptogenic stroke, afib subsequently detected. Previously okayed by Dr Anne Fu to hold Eliquis for 2 days prior to a different procedure in 2021.   CrCl 18mL/min Platelet count 202K   Per office protocol, patient can hold Eliquis for 1-2 days prior to procedure. Resume as soon as safely possible after given elevated CV risk.      Carlos Levering, NP 04/06/2023, 9:21 AM [facility]

## 2023-04-06 NOTE — Telephone Encounter (Signed)
Patient with diagnosis of afib on Eliquis for anticoagulation.    Procedure: Cystoscopy with bladder biopsy  Date of procedure: 04/12/23  CHA2DS2-VASc Score = 5  This indicates a 7.2% annual risk of stroke. The patient's score is based upon: CHF History: 0 HTN History: 1 Diabetes History: 0 Stroke History: 2 Vascular Disease History: 0 Age Score: 2 Gender Score: 0  Prior cryptogenic stroke, afib subsequently detected. Previously okayed by Dr Anne Fu to hold Eliquis for 2 days prior to a different procedure in 2021.  CrCl 9mL/min Platelet count 202K  Per office protocol, patient can hold Eliquis for 1-2 days prior to procedure. Resume as soon as safely possible after given elevated CV risk.   **This guidance is not considered finalized until pre-operative APP has relayed final recommendations.**

## 2023-04-06 NOTE — Telephone Encounter (Signed)
  Patient Consent for Virtual Visit        Kyle Owen has provided verbal consent on 04/06/2023 for a virtual visit (video or telephone).   CONSENT FOR VIRTUAL VISIT FOR:  Kyle Owen  By participating in this virtual visit I agree to the following:  I hereby voluntarily request, consent and authorize [facility] and its employed or contracted physicians, physician assistants, nurse practitioners or other licensed health care professionals (the Practitioner), to provide me with telemedicine health care services (the "Services") as deemed necessary by the treating Practitioner. I acknowledge and consent to receive the Services by the Practitioner via telemedicine. I understand that the telemedicine visit will involve communicating with the Practitioner through live audiovisual communication technology and the disclosure of certain medical information by electronic transmission. I acknowledge that I have been given the opportunity to request an in-person assessment or other available alternative prior to the telemedicine visit and am voluntarily participating in the telemedicine visit.  I understand that I have the right to withhold or withdraw my consent to the use of telemedicine in the course of my care at any time, without affecting my right to future care or treatment, and that the Practitioner or I may terminate the telemedicine visit at any time. I understand that I have the right to inspect all information obtained and/or recorded in the course of the telemedicine visit and may receive copies of available information for a reasonable fee.  I understand that some of the potential risks of receiving the Services via telemedicine include:  Delay or interruption in medical evaluation due to technological equipment failure or disruption; Information transmitted may not be sufficient (e.g. poor resolution of images) to allow for appropriate medical decision making by the  Practitioner; and/or  In rare instances, security protocols could fail, causing a breach of personal health information.  Furthermore, I acknowledge that it is my responsibility to provide information about my medical history, conditions and care that is complete and accurate to the best of my ability. I acknowledge that Practitioner's advice, recommendations, and/or decision may be based on factors not within their control, such as incomplete or inaccurate data provided by me or distortions of diagnostic images or specimens that may result from electronic transmissions. I understand that the practice of medicine is not an exact science and that Practitioner makes no warranties or guarantees regarding treatment outcomes. I acknowledge that a copy of this consent can be made available to me via my patient portal [facility] MyChart), or I can request a printed copy by calling the office of [facility].    I understand that my insurance will be billed for this visit.   I have read or had this consent read to me. I understand the contents of this consent, which adequately explains the benefits and risks of the Services being provided via telemedicine.  I have been provided ample opportunity to ask questions regarding this consent and the Services and have had my questions answered to my satisfaction. I give my informed consent for the services to be provided through the use of telemedicine in my medical care

## 2023-04-09 ENCOUNTER — Encounter (HOSPITAL_BASED_OUTPATIENT_CLINIC_OR_DEPARTMENT_OTHER): Payer: Self-pay | Admitting: Urology

## 2023-04-09 ENCOUNTER — Ambulatory Visit: Payer: Medicare Other | Attending: Cardiovascular Disease

## 2023-04-09 DIAGNOSIS — Z01810 Encounter for preprocedural cardiovascular examination: Secondary | ICD-10-CM

## 2023-04-09 NOTE — Progress Notes (Signed)
Chart reviewed for pre-op interview for surgery scheduled on 04-12-2023 by Dr Retta Diones @[facility] .  Noted pt w/ cardiac and pulmonary medical history uses oxygen at night and last surgery at [facility] 01-23-2022 was given ASA IV by anesthesia.  Called and reviewed chart w/ anesthesia , Dr Aleene Davidson MDA, via phone.  Dr Sampson Goon reviewed pt chart stated pt would be best done at main OR. Called and left message for [place], OR schedule for Dr Retta Diones, informed her per anesthesiology pt needs to be done at main OR and why.

## 2023-04-09 NOTE — Progress Notes (Signed)
Virtual Visit via Telephone Note   Because of Kyle Owen's co-morbid illnesses, he is at least at moderate risk for complications without adequate follow up.  This format is felt to be most appropriate for this patient at this time.  The patient did not have access to video technology/had technical difficulties with video requiring transitioning to audio format only (telephone).  All issues noted in this document were discussed and addressed.  No physical exam could be performed with this format.  Please refer to the patient's chart for his consent to telehealth for [facility]  Evaluation Performed:  Preoperative cardiovascular risk assessment _____________   Date:  04/09/2023   Patient ID:  Kyle Owen, DOB [birth year], MRN [id number] Patient Location:  Home Provider location:   Office  Primary Care Provider:  Carylon Perches, MD Primary Cardiologist:  Donato Schultz, MD  Chief Complaint / Patient Profile   82 y.o. y/o male with a h/o ascending aortic aneurysm 4.7 cm followed by Dr. Donata Clay, paroxysmal atrial fibrillation on Eliquis, hypertension, hyperlipidemia who is pending cystoscopy with bladder biopsy and presents today for telephonic preoperative cardiovascular risk assessment.  History of Present Illness    Kyle Owen is a 82 y.o. male who presents via audio/video conferencing for a telehealth visit today.  Pt was last seen in cardiology clinic on 11/21/2022 by Dr. Anne Fu.  At that time Kyle Owen was doing well and cutting up trees that were cut down with a chainsaw.  The patient is now pending procedure as outlined above. Since his last visit, he tells me that he is doing well.  He just had a visit with his PCP and was doing well at that time.  No chest pains.  Sometimes does have shortness of breath but he has COPD which she has been dealing with a long time.  He does work out in the yard and trims bushes.  He also enjoys shooting trap.  He does walk the  dog some as well.  He has scored above 4 METS on the DASI.  No fentanyl since he was combative.    Per office protocol, patient can hold Eliquis for 1-2 days prior to procedure. Resume as soon as safely possible after given elevated CV risk.    Past Medical History    Past Medical History:  Diagnosis Date   Benign prostatic hypertrophy with urinary frequency    Bladder cancer    urologist--- dr Retta Diones   Complication of anesthesia    gets combative in recovery   COPD with emphysema    Depression    Dyspnea    when walking   History of colon polyps    History of kidney stones    History of loop recorder    loop recorder in place battery is dead has not had changed due to covid   History of TIA (transient ischemic attack) no residual per pt   per neurologist note (dr Marjory Lies 11-08-2015) dx cryptogenic TIA versus arrhythia versus dysautonomia (right ophthalmic artery TIA and x3 posterior circulation TIAs   Hyperlipidemia    Hypertension    Multinodular thyroid    per pathology report 11-12-2014 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    Nephrolithiasis    OA (osteoarthritis)    DJD right knee   Ocular migraine    controlled w/ verapamil   PAF (paroxysmal atrial fibrillation)    followed by AFIB clinic-- cardiologist-- dr Anne Fu   Pulmonary  nodule, left    left lower lobe x2 per CT 01-20-2016   Renal cyst, left    Thoracic aortic aneurysm without rupture    ascending -- ct chest 01/10/16  4.8cm   Wears hearing aid in both ears    wear at times   Past Surgical History:  Procedure Laterality Date   ANTERIOR CERVICAL DECOMP/DISCECTOMY FUSION N/A 04/03/2017   Procedure: Cervical three-four, Cervical four-five Anterior cervical decompression/discectomy/fusion;  Surgeon: Maeola Harman, MD;  Location: [facility] OR;  Service: Neurosurgery;  Laterality: N/A;   CATARACT EXTRACTION W/ INTRAOCULAR LENS IMPLANT Right 2016   CATARACT EXTRACTION W/PHACO   12/16/2012   Procedure: CATARACT EXTRACTION PHACO AND INTRAOCULAR LENS PLACEMENT (IOC);  Surgeon: Gemma Payor, MD;  Location: [facility] ORS;  Service: Ophthalmology;  Laterality: Left;  CDE:17.31   COLONOSCOPY  10/03/2011   Procedure: COLONOSCOPY;  Surgeon: Dalia Heading;  Location: [facility] ENDO SUITE;  Service: Gastroenterology;  Laterality: N/A;   CYSTOSCOPY/RETROGRADE/URETEROSCOPY/STONE EXTRACTION WITH BASKET Right 03/29/2020   Procedure: CYSTOSCOPY/RETROGRADE/URETEROSCOPY/STONE EXTRACTION WITH BASKET;  Surgeon: Marcine Matar, MD;  Location: [facility] ORS;  Service: Urology;  Laterality: Right;  1 HR   DECOMPRESSIOIN ULNAR NERVE AND CUBITAL TUNNEL RELEASE Left 07/14/2009   elbow   EP IMPLANTABLE DEVICE N/A 08/10/2015   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   HOLMIUM LASER APPLICATION Right 03/29/2020   Procedure: HOLMIUM LASER APPLICATION;  Surgeon: Marcine Matar, MD;  Location: [facility] ORS;  Service: Urology;  Laterality: Right;   INGUINAL HERNIA REPAIR Right 1990's   KNEE ARTHROSCOPY Right 2015   POSTERIOR LUMBAR FUSION     2014   L3--4;    12/ 2018   L4--5   SHOULDER ARTHROSCOPY Left 1990's   TEE WITHOUT CARDIOVERSION N/A 07/27/2015   Procedure: TRANSESOPHAGEAL ECHOCARDIOGRAM (TEE);  Surgeon: Lewayne Bunting, MD;  Location: [facility] ENDOSCOPY;  Service: Cardiovascular;  Laterality: N/A;   normal LV function, ef 55-60%,  mild AR and MR, mild dilated ascending aorta (4.2cm),  mild to moderate atherosclerosis  descending aorta,  mild to moderate TR,  negative saline microcavitation study   THYROIDECTOMY Right 01/20/2015   Procedure: RIGHT THYROIDECTOMY;  Surgeon: Darletta Moll, MD;  Location: [facility] OR;  Service: ENT;  Laterality: Right;   TONSILLECTOMY  as child   TOTAL HIP ARTHROPLASTY Left 12/14/2020   Procedure: TOTAL HIP ARTHROPLASTY ANTERIOR APPROACH;  Surgeon: Sheral Apley, MD;  Location: [facility] ORS;  Service: Orthopedics;  Laterality: Left;   TRANSURETHRAL RESECTION OF BLADDER TUMOR N/A  05/15/2016   Procedure: TRANSURETHRAL RESECTION OF BLADDER TUMOR (TURBT) AND INSTILLATION OF EPIRUBICIN;  Surgeon: Marcine Matar, MD;  Location: [facility];  Service: Urology;  Laterality: N/A;   TRANSURETHRAL RESECTION OF BLADDER TUMOR N/A 03/29/2020   Procedure: TRANSURETHRAL RESECTION OF BLADDER TUMOR (TURBT);  Surgeon: Marcine Matar, MD;  Location: [facility] ORS;  Service: Urology;  Laterality: N/A;   TRANSURETHRAL RESECTION OF BLADDER TUMOR WITH MITOMYCIN-C N/A 01/23/2022   Procedure: TRANSURETHRAL RESECTION OF BLADDER TUMOR WITH POST OPERATIVE GEMCITABINE INSTILLATION;  Surgeon: Marcine Matar, MD;  Location: [facility] ORS;  Service: Urology;  Laterality: N/A;    Allergies  Allergies  Allergen Reactions   Flomax [Tamsulosin Hcl] Nausea And Vomiting and Other (See Comments)    "Pt thought he was dying " DIZZY    Home Medications    Prior to Admission medications   Medication Sig Start Date End Date Taking? Authorizing Provider  acetaminophen (TYLENOL) 500 MG tablet Take 500 mg by mouth every  6 (six) hours as needed.    [provider]  atorvastatin (LIPITOR) 20 MG tablet TAKE 1 TABLET BY MOUTH DAILY. 05/09/18   Jake Bathe, MD  budesonide-formoterol (SYMBICORT) 160-4.5 MCG/ACT inhaler Inhale 2 puffs into the lungs 2 (two) times daily. 01/23/23   Oretha Milch, MD  Cholecalciferol (VITAMIN D) 50 MCG (2000 UT) tablet Take 2,000 Units by mouth daily.    [provider]  ELIQUIS 5 MG TABS tablet Take 5 mg by mouth 2 (two) times daily. 02/10/22   [provider]  finasteride (PROSCAR) 5 MG tablet Take 5 mg by mouth daily.     [provider]  Liniments (DEEP BLUE RELIEF EX) Apply topically.    [provider]  loratadine (CLARITIN) 10 MG tablet Take 10 mg by mouth daily.    [provider]  Magnesium 500 MG TABS Take 500 mg by mouth daily.     [provider]  sertraline (ZOLOFT) 50 MG tablet Take 50 mg by  mouth every morning.    [provider]  VENTOLIN HFA 108 (90 BASE) MCG/ACT inhaler Inhale 2 puffs into the lungs Every 4 hours as needed for wheezing or shortness of breath.  09/24/12   [provider]  verapamil (CALAN-SR) 240 MG CR tablet Take 240 mg by mouth daily.    [provider]    Physical Exam    Vital Signs:  Camille Bal does not have vital signs available for review today.  Given telephonic nature of communication, physical exam is limited. AAOx3. NAD. Normal affect.  Speech and respirations are unlabored.  Accessory Clinical Findings    None  Assessment & Plan    1.  Preoperative Cardiovascular Risk Assessment:  Mr. Antczak perioperative risk of a major cardiac event is 0.9% according to the Revised Cardiac Risk Index (RCRI).  Therefore, he is at low risk for perioperative complications.   His functional capacity is good at 5.62 METs according to the [facility] Activity Status Index (DASI). Recommendations: According to ACC/AHA guidelines, no further cardiovascular testing needed.  The patient may proceed to surgery at acceptable risk.   Antiplatelet and/or Anticoagulation Recommendations:  Eliquis (Apixaban) can be held for 1-2 days prior to surgery.  Please resume post op when felt to be safe.     A copy of this note will be routed to requesting surgeon.  Time:   Today, I have spent 10 minutes with the patient with telehealth technology discussing medical history, symptoms, and management plan.     Sharlene Dory, PA-C  04/09/2023, 8:17 AM

## 2023-04-09 NOTE — Patient Instructions (Signed)
SURGICAL WAITING ROOM VISITATION Patients having surgery or a procedure may have no more than 2 support people in the waiting area - these visitors may rotate.    Children under the age of 60 must have an adult with them who is not the patient.  If the patient needs to stay at the hospital during part of their recovery, the visitor guidelines for inpatient rooms apply. Pre-op nurse will coordinate an appropriate time for 1 support person to accompany patient in pre-op.  This support person may not rotate.    Please refer to the [facility] website for the visitor guidelines for Inpatients (after your surgery is over and you are in a regular room).       Your procedure is scheduled on: 04-12-23   Report to [facility] Main Entrance    Report to admitting at 10:30 AM   Call this number if you have problems the morning of surgery [phone]   Do not eat food or drink liquids :After Midnight.          If you have questions, please contact your surgeon's office.   FOLLOW  ANY ADDITIONAL PRE OP INSTRUCTIONS YOU RECEIVED FROM YOUR SURGEON'S OFFICE!!!     Oral Hygiene is also important to reduce your risk of infection.                                    Remember - BRUSH YOUR TEETH THE MORNING OF SURGERY WITH YOUR REGULAR TOOTHPASTE   Do NOT smoke after Midnight   Take these medicines the morning of surgery with A SIP OF WATER:   Atorvastatin  Zyrtec  Finasteride  Sertraline  Verapamil  Tylenol  Okay to use inhalers   Hold Eliquis before surgery  Bring CPAP mask and tubing day of surgery.                              You may not have any metal on your body including  jewelry, and body piercing             Do not wear lotions, powders, cologne, or deodorant              Men may shave face and neck.   Do not bring valuables to the hospital.  IS NOT RESPONSIBLE   FOR VALUABLES.   Contacts, dentures or bridgework may not be worn into surgery.  DO NOT  BRING YOUR HOME MEDICATIONS TO THE HOSPITAL. PHARMACY WILL DISPENSE MEDICATIONS LISTED ON YOUR MEDICATION LIST TO YOU DURING YOUR ADMISSION IN THE HOSPITAL!    Patients discharged on the day of surgery will not be allowed to drive home.  Someone NEEDS to stay with you for the first 24 hours after anesthesia.   Special Instructions: Bring a copy of your healthcare power of attorney and living will documents the day of surgery if you haven't scanned them before.              Please read over the following fact sheets you were given: IF YOU HAVE QUESTIONS ABOUT YOUR PRE-OP INSTRUCTIONS PLEASE CALL [phone] Gwen  If you received a COVID test during your pre-op visit  it is requested that you wear a mask when out in public, stay away from anyone that may not be feeling well and notify your surgeon if you develop symptoms.  If you test positive for Covid or have been in contact with anyone that has tested positive in the last 10 days please notify you surgeon.  [place] - Preparing for Surgery Before surgery, you can play an important role.  Because skin is not sterile, your skin needs to be as free of germs as possible.  You can reduce the number of germs on your skin by washing with CHG (chlorahexidine gluconate) soap before surgery.  CHG is an antiseptic cleaner which kills germs and bonds with the skin to continue killing germs even after washing. Please DO NOT use if you have an allergy to CHG or antibacterial soaps.  If your skin becomes reddened/irritated stop using the CHG and inform your nurse when you arrive at Short Stay. Do not shave (including legs and underarms) for at least 48 hours prior to the first CHG shower.  You may shave your face/neck.  Please follow these instructions carefully:  1.  Shower with CHG Soap the night before surgery and the  morning of surgery.  2.  If you choose to wash your hair, wash your hair first as usual with your normal  shampoo.  3.  After you shampoo,  rinse your hair and body thoroughly to remove the shampoo.                             4.  Use CHG as you would any other liquid soap.  You can apply chg directly to the skin and wash.  Gently with a scrungie or clean washcloth.  5.  Apply the CHG Soap to your body ONLY FROM THE NECK DOWN.   Do   not use on face/ open                           Wound or open sores. Avoid contact with eyes, ears mouth and   genitals (private parts).                       Wash face,  Genitals (private parts) with your normal soap.             6.  Wash thoroughly, paying special attention to the area where your    surgery  will be performed.  7.  Thoroughly rinse your body with warm water from the neck down.  8.  DO NOT shower/wash with your normal soap after using and rinsing off the CHG Soap.                9.  Pat yourself dry with a clean towel.            10.  Wear clean pajamas.            11.  Place clean sheets on your bed the night of your first shower and do not  sleep with pets. Day of Surgery : Do not apply any lotions/deodorants the morning of surgery.  Please wear clean clothes to the hospital/surgery center.  FAILURE TO FOLLOW THESE INSTRUCTIONS MAY RESULT IN THE CANCELLATION OF YOUR SURGERY  PATIENT SIGNATURE_________________________________  NURSE SIGNATURE__________________________________  ________________________________________________________________________

## 2023-04-09 NOTE — Progress Notes (Addendum)
COVID Vaccine Completed:  Yes  Date of COVID positive in last 90 days:  PCP - Carylon Perches, MD Cardiologist - Donato Schultz, MD Pulmonologist - Cyril Mourning, MD  Cardiac clearance in Epic dated 04-09-23 by Jari Favre, PA-C  Chest x-ray - 09-08-22 Epic. CT chest 01-24-23 Epic. CT angio chest aorta 10-24-22 Epic EKG - 09-08-22 Epic Stress Test - 06-08-17 Epic ECHO - 07-27-15 Epic Cardiac Cath -  Pacemaker/ICD device last checked: Spinal Cord Stimulator: Loop Recorder  Bowel Prep -   Sleep Study -  CPAP -   Fasting Blood Sugar -  Checks Blood Sugar _____ times a day  Last dose of GLP1 agonist-  N/A GLP1 instructions:  N/A   Last dose of SGLT-2 inhibitors-  N/A SGLT-2 instructions: N/A   Blood Thinner Instructions:  Eliquis.  Okay to hold 1-2 days preop per clearance.   Time: Aspirin Instructions: Last Dose:  Activity level:  Can go up a flight of stairs and perform activities of daily living without stopping and without symptoms of chest pain or shortness of breath.  Able to exercise without symptoms  Unable to go up a flight of stairs without symptoms of     Anesthesia review: Afib, Thoracic aneurysm, COPD.  Home oxygen. HTN  Patient denies shortness of breath, fever, cough and chest pain at PAT appointment  Patient verbalized understanding of instructions that were given to them at the PAT appointment. Patient was also instructed that they will need to review over the PAT instructions again at home before surgery.

## 2023-04-11 ENCOUNTER — Other Ambulatory Visit: Payer: Self-pay

## 2023-04-11 ENCOUNTER — Encounter (HOSPITAL_COMMUNITY)
Admission: RE | Admit: 2023-04-11 | Discharge: 2023-04-11 | Disposition: A | Discharge status: Home / Self Care | Payer: Medicare Other | Source: Ambulatory Visit | Attending: Urology | Admitting: Urology

## 2023-04-11 ENCOUNTER — Encounter (HOSPITAL_COMMUNITY): Payer: Self-pay

## 2023-04-11 VITALS — BP 116/51 | HR 66 | Temp 98.0°F | Resp 20 | Ht 73.0 in | Wt 185.6 lb

## 2023-04-11 DIAGNOSIS — C679 Malignant neoplasm of bladder, unspecified: Secondary | ICD-10-CM | POA: Diagnosis not present

## 2023-04-11 DIAGNOSIS — Z7901 Long term (current) use of anticoagulants: Secondary | ICD-10-CM | POA: Diagnosis not present

## 2023-04-11 DIAGNOSIS — I10 Essential (primary) hypertension: Secondary | ICD-10-CM

## 2023-04-11 DIAGNOSIS — Z01812 Encounter for preprocedural laboratory examination: Secondary | ICD-10-CM | POA: Diagnosis present

## 2023-04-11 DIAGNOSIS — J449 Chronic obstructive pulmonary disease, unspecified: Secondary | ICD-10-CM | POA: Insufficient documentation

## 2023-04-11 DIAGNOSIS — I700 Atherosclerosis of aorta: Secondary | ICD-10-CM | POA: Insufficient documentation

## 2023-04-11 DIAGNOSIS — N189 Chronic kidney disease, unspecified: Secondary | ICD-10-CM | POA: Diagnosis not present

## 2023-04-11 DIAGNOSIS — I7121 Aneurysm of the ascending aorta, without rupture: Secondary | ICD-10-CM | POA: Diagnosis not present

## 2023-04-11 DIAGNOSIS — I129 Hypertensive chronic kidney disease with stage 1 through stage 4 chronic kidney disease, or unspecified chronic kidney disease: Secondary | ICD-10-CM | POA: Insufficient documentation

## 2023-04-11 DIAGNOSIS — I2510 Atherosclerotic heart disease of native coronary artery without angina pectoris: Secondary | ICD-10-CM | POA: Diagnosis not present

## 2023-04-11 DIAGNOSIS — I480 Paroxysmal atrial fibrillation: Secondary | ICD-10-CM | POA: Diagnosis not present

## 2023-04-11 HISTORY — DX: Dependence on supplemental oxygen: Z99.81

## 2023-04-11 HISTORY — DX: Unspecified atrial fibrillation: I48.91

## 2023-04-11 LAB — BASIC METABOLIC PANEL
Anion gap: 9 (ref 5–15)
BUN: 45 mg/dL — ABNORMAL HIGH (ref 8–23)
CO2: 26 mmol/L (ref 22–32)
Calcium: 9.1 mg/dL (ref 8.9–10.3)
Chloride: 108 mmol/L (ref 98–111)
Creatinine, Ser: 1.58 mg/dL — ABNORMAL HIGH (ref 0.61–1.24)
GFR, Estimated: 43 mL/min — ABNORMAL LOW (ref >60)
Glucose, Bld: 108 mg/dL — ABNORMAL HIGH (ref 70–99)
Potassium: 4 mmol/L (ref 3.5–5.1)
Sodium: 143 mmol/L (ref 135–145)

## 2023-04-11 NOTE — Progress Notes (Signed)
Anesthesia Chart Review   Case: [id number] Date/Time: 04/12/23 1230   Procedures:      CYSTOSCOPY WITH BIOPSY - 45 MINS     CYSTOSCOPY WITH RETROGRADE PYELOGRAM (Bilateral)   Anesthesia type: General   Pre-op diagnosis: BLADDER CANCER   Location: [facility]   Surgeons: Marcine Matar, MD       DISCUSSION:82 y.o. former smoker with h/o HTN, PAF (loop recorder in place at end-of-life, patient not interested in having explanted), COPD on nocturnal O2, ascending aortic aneurysm 4.7 cm followed by Dr. Donata Clay , CKD, bladder cancer scheduled for above procedure 04/12/2023 with Dr. Marcine Matar.   Last seen by pulmonology 12/05/2022.  Per office visit note, He was able to ambulate 1 lap and his oxygen saturation stayed at 94%, no need for daytime O2. PFTs 10/2020 moderate airway obstruction, ratio 62, FEV1 1.83/58%, FVC 67%, TLC normal, DLCO 22.8/88%.  Per cardiology, Dr. Donato Schultz, note 11/21/2022, "He saw Dr. Maren Beach 05/15/2022 His aortic aneurysm is stable at 4.7 cm, but due to his history of bladder cancer and age he is not a good candidate for surgery. He will be getting a recheck in 18 months."  CT Angio 10/24/2022  Stable uncomplicated fusiform aneurysmal dilatation of the ascending thoracic aorta and proximal descending thoracic aorta measuring 48 and 43 mm in diameter respectively, grossly unchanged compared to the 11/2015 examination by my direct remeasurement.  Pt last seen by cardiology 04/09/2023. Per OV note, "Mr. Kyle Owen's perioperative risk of a major cardiac event is 0.9% according to the Revised Cardiac Risk Index (RCRI).  Therefore, he is at low risk for perioperative complications.   His functional capacity is good at 5.62 METs according to the Duke Activity Status Index (DASI). Recommendations: According to ACC/AHA guidelines, no further cardiovascular testing needed.  The patient may proceed to surgery at acceptable risk.   Antiplatelet and/or  Anticoagulation Recommendations:   Eliquis (Apixaban) can be held for 1-2 days prior to surgery.  Please resume post op when felt to be safe."  S/p resection of bladder tumor 01/23/2022. Per anesthesia note ASA 4.  VS: BP (!) 116/51   Pulse 66   Temp 36.7 C (Oral)   Resp 20   Ht 6\' 1"  (1.854 m)   Wt 84.2 kg   SpO2 91%   BMI 24.49 kg/m   PROVIDERS: Carylon Perches, MD is PCP   [facility] Cardiologist:  Donato Schultz, MD  LABS: Labs reviewed: Acceptable for surgery. (all labs ordered are listed, but only abnormal results are displayed)  Labs Reviewed  BASIC METABOLIC PANEL     IMAGES: CT Angio Chest/Aorta 10/26/2020: COMPARISON:  09/17/2019   FINDINGS: Cardiovascular: 4.7 cm ascending thoracic aortic aneurysm again noted, unchanged. Distal aortic arch mildly aneurysmal at 4 cm, stable. Proximal descending thoracic aorta 3 cm. No dissection. Heart is normal size. Coronary artery and aortic calcifications.   Mediastinum/Nodes: No mediastinal, hilar, or axillary adenopathy. Trachea and esophagus are unremarkable. Presumed prior right thyroidectomy. No significant findings within the left thyroid lobe.   Lungs/Pleura: Calcified granuloma in the right upper lobe. Linear scarring in the lung bases. No effusions.   Upper Abdomen: Imaging into the upper abdomen demonstrates no acute findings. Calcifications in the spleen and liver compatible with old granulomatous disease.   Musculoskeletal: Chest wall soft tissues are unremarkable. No acute bony abnormality.   Review of the MIP images confirms the above findings.   IMPRESSION: Stable 4.7 cm ascending thoracic aortic aneurysm.  Distal aortic arch aneurysmal at 4 cm, stable.   Coronary artery disease.   No acute cardiopulmonary disease.   Old granulomatous disease.   Aortic Atherosclerosis (ICD10-I70.0).  EKG:   CV: Myocardial perfusion 06/08/2017 Nuclear stress EF: 66%. There was no ST segment deviation noted  during stress. The study is normal. This is a low risk study. The left ventricular ejection fraction is hyperdynamic (>65%).   Thinning of the inferior wall at mid and basal level thought to be from extra cardiac /diaphragmatic attenuation No ischemia and no RWMA;s EF 66%  Echo 08/20/2015 - Left ventricle: Systolic function was normal. The estimated    ejection fraction was in the range of 55% to 60%. Wall motion was    normal; there were no regional wall motion abnormalities.  - Aortic valve: No evidence of vegetation. There was mild    regurgitation.  - Ascending aorta: The ascending aorta was mildly dilated.  - Mitral valve: No evidence of vegetation. There was mild    regurgitation.  - Left atrium: No evidence of thrombus in the atrial cavity or    appendage.  - Right atrium: No evidence of thrombus in the atrial cavity or    appendage.  - Atrial septum: No defect or patent foramen ovale was identified.  - Tricuspid valve: No evidence of vegetation. There was    mild-moderate regurgitation.  - Pulmonic valve: No evidence of vegetation.  Past Medical History:  Diagnosis Date   A-fib    Anticoagulant long-term use    eliquis--- managed by cardiology   Benign prostatic hypertrophy with urinary frequency    Chronic respiratory failure with hypoxia    Complication of anesthesia    gets combative in recovery   COPD with chronic bronchitis and emphysema    pulmonologist--- dr Vassie Loll;  nocturnal oxygen,  no daytime oxygen use   Dependence on nocturnal oxygen therapy    Depression    Dyspnea    when walking   History of colon polyps    History of kidney stones    History of loop recorder    loop recorder in place battery is dead has not had changed due to covid   History of TIA (transient ischemic attack)    (per pt no residual ) per neurologist note (dr Marjory Lies 11-08-2015) dx cryptogenic TIA versus arrhythia versus dysautonomia (right ophthalmic artery TIA and x3 posterior  circulation TIAs   Hyperlipidemia    Hypertension    Pt denies   Malignant neoplasm of overlapping sites of bladder    urologist--- dr Retta Diones   Multinodular thyroid    per pathology report 11-12-2014 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    Nephrolithiasis    OA (osteoarthritis)    DJD right knee   Ocular migraine    controlled w/ verapamil   On home oxygen therapy    PAF (paroxysmal atrial fibrillation)    followed by AFIB clinic-- cardiologist-- dr Anne Fu   Pneumonia    2023   Pulmonary nodule, left    left lower lobe x2 per CT 01-20-2016   Renal cyst, left    Stroke    Thoracic aortic aneurysm without rupture    followed by dr Zenaida Niece tright ;   ascending -- ct chest 01-24-2023   4.8cm   Wears hearing aid in both ears    wear at times    Past Surgical History:  Procedure Laterality Date   ANTERIOR CERVICAL DECOMP/DISCECTOMY FUSION N/A 04/03/2017  Procedure: Cervical three-four, Cervical four-five Anterior cervical decompression/discectomy/fusion;  Surgeon: Maeola Harman, MD;  Location: [facility] OR;  Service: Neurosurgery;  Laterality: N/A;   CATARACT EXTRACTION W/ INTRAOCULAR LENS IMPLANT Right 2016   CATARACT EXTRACTION W/PHACO  12/16/2012   Procedure: CATARACT EXTRACTION PHACO AND INTRAOCULAR LENS PLACEMENT (IOC);  Surgeon: Gemma Payor, MD;  Location[facility];  Service: Ophthalmology;  Laterality: Left;  CDE:17.31   COLONOSCOPY  10/03/2011   Procedure: COLONOSCOPY;  Surgeon: Dalia Heading;  [facility];  Service: Gastroenterology;  Laterality: N/A;   CYSTOSCOPY/RETROGRADE/URETEROSCOPY/STONE EXTRACTION WITH BASKET Right 03/29/2020   Procedure: CYSTOSCOPY/RETROGRADE/URETEROSCOPY/STONE EXTRACTION WITH BASKET;  Surgeon: Marcine Matar, MD;  [facility];  Service: Urology;  Laterality: Right;  1 HR   DECOMPRESSIOIN ULNAR NERVE AND CUBITAL TUNNEL RELEASE Left 07/14/2009   elbow   EP IMPLANTABLE DEVICE N/A 08/10/2015   MDT ILR  implanted by Dr Johney Frame for cryptogenic stroke   HOLMIUM LASER APPLICATION Right 03/29/2020   Procedure: HOLMIUM LASER APPLICATION;  Surgeon: Marcine Matar, MD;  [facility];  Service: Urology;  Laterality: Right;   INGUINAL HERNIA REPAIR Right 1990's   KNEE ARTHROSCOPY Right 2015   POSTERIOR LUMBAR FUSION     2014   L3--4;    12/ 2018   L4--5   SHOULDER ARTHROSCOPY Left 1990's   TEE WITHOUT CARDIOVERSION N/A 07/27/2015   Procedure: TRANSESOPHAGEAL ECHOCARDIOGRAM (TEE);  Surgeon: Lewayne Bunting, MD;  Location: [facility] ENDOSCOPY;  Service: Cardiovascular;  Laterality: N/A;   normal LV function, ef 55-60%,  mild AR and MR, mild dilated ascending aorta (4.2cm),  mild to moderate atherosclerosis  descending aorta,  mild to moderate TR,  negative saline microcavitation study   THYROIDECTOMY Right 01/20/2015   Procedure: RIGHT THYROIDECTOMY;  Surgeon: Darletta Moll, MD;  Location: [facility] OR;  Service: ENT;  Laterality: Right;   TONSILLECTOMY  as child   TOTAL HIP ARTHROPLASTY Left 12/14/2020   Procedure: TOTAL HIP ARTHROPLASTY ANTERIOR APPROACH;  Surgeon: Sheral Apley, MD;  [facility];  Service: Orthopedics;  Laterality: Left;   TRANSURETHRAL RESECTION OF BLADDER TUMOR N/A 05/15/2016   Procedure: TRANSURETHRAL RESECTION OF BLADDER TUMOR (TURBT) AND INSTILLATION OF EPIRUBICIN;  Surgeon: Marcine Matar, MD;  Location: [facility];  Service: Urology;  Laterality: N/A;   TRANSURETHRAL RESECTION OF BLADDER TUMOR N/A 03/29/2020   Procedure: TRANSURETHRAL RESECTION OF BLADDER TUMOR (TURBT);  Surgeon: Marcine Matar, MD;  [facility];  Service: Urology;  Laterality: N/A;   TRANSURETHRAL RESECTION OF BLADDER TUMOR WITH MITOMYCIN-C N/A 01/23/2022   Procedure: TRANSURETHRAL RESECTION OF BLADDER TUMOR WITH POST OPERATIVE GEMCITABINE INSTILLATION;  Surgeon: Marcine Matar, MD;  [facility];  Service: Urology;  Laterality: N/A;    MEDICATIONS:  acetaminophen  (TYLENOL) 500 MG tablet   atorvastatin (LIPITOR) 20 MG tablet   budesonide-formoterol (SYMBICORT) 160-4.5 MCG/ACT inhaler   cetirizine (ZYRTEC) 10 MG tablet   Cholecalciferol (VITAMIN D) 50 MCG (2000 UT) tablet   ELIQUIS 5 MG TABS tablet   finasteride (PROSCAR) 5 MG tablet   Liniments (DEEP BLUE RELIEF EX)   Magnesium 250 MG TABS   OXYGEN   sertraline (ZOLOFT) 50 MG tablet   VENTOLIN HFA 108 (90 BASE) MCG/ACT inhaler   verapamil (CALAN-SR) 240 MG CR tablet   No current facility-administered medications for this encounter.   Jodell Cipro Ward, PA-C [facility] [phone]

## 2023-04-11 NOTE — Anesthesia Preprocedure Evaluation (Signed)
Anesthesia Evaluation    Airway        Dental   Pulmonary former smoker          Cardiovascular hypertension,      Neuro/Psych    GI/Hepatic   Endo/Other    Renal/GU      Musculoskeletal   Abdominal   Peds  Hematology   Anesthesia Other Findings   Reproductive/Obstetrics                             Anesthesia Physical Anesthesia Plan  ASA:   Anesthesia Plan:    Post-op Pain Management:    Induction:   PONV Risk Score and Plan:   Airway Management Planned:   Additional Equipment:   Intra-op Plan:   Post-operative Plan:   Informed Consent:   Plan Discussed with:   Anesthesia Plan Comments: (See PAT note 04/11/2023)       Anesthesia Quick Evaluation  

## 2023-04-11 NOTE — H&P (Signed)
H&P  Chief Complaint: History of bladder cancer with positive cytologies  History of Present Illness: 82 year old male presents at this time for cystoscopy, bilateral retrogrades and bladder biopsies.  He has a history of high-grade nonmuscle invasive bladder cancer and has been on BCG immune therapy..  Recent cystoscopy in the office was negative, but he had cytology consistent with high-grade urothelial carcinoma.  Past Medical History:  Diagnosis Date   A-fib    Anticoagulant long-term use    eliquis--- managed by cardiology   Benign prostatic hypertrophy with urinary frequency    Chronic respiratory failure with hypoxia    Complication of anesthesia    gets combative in recovery   COPD with chronic bronchitis and emphysema    pulmonologist--- dr Vassie Loll;  nocturnal oxygen,  no daytime oxygen use   Dependence on nocturnal oxygen therapy    Depression    Dyspnea    when walking   History of colon polyps    History of kidney stones    History of loop recorder    loop recorder in place battery is dead has not had changed due to covid   History of TIA (transient ischemic attack)    (per pt no residual ) per neurologist note (dr Marjory Lies 11-08-2015) dx cryptogenic TIA versus arrhythia versus dysautonomia (right ophthalmic artery TIA and x3 posterior circulation TIAs   Hyperlipidemia    Hypertension    Pt denies   Malignant neoplasm of overlapping sites of bladder    urologist--- dr Retta Diones   Multinodular thyroid    per pathology report 11-12-2014 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    Nephrolithiasis    OA (osteoarthritis)    DJD right knee   Ocular migraine    controlled w/ verapamil   On home oxygen therapy    PAF (paroxysmal atrial fibrillation)    followed by AFIB clinic-- cardiologist-- dr Anne Fu   Pneumonia    2023   Pulmonary nodule, left    left lower lobe x2 per CT 01-20-2016   Renal cyst, left    Stroke    Thoracic aortic  aneurysm without rupture    followed by dr Zenaida Niece tright ;   ascending -- ct chest 01-24-2023   4.8cm   Wears hearing aid in both ears    wear at times    Past Surgical History:  Procedure Laterality Date   ANTERIOR CERVICAL DECOMP/DISCECTOMY FUSION N/A 04/03/2017   Procedure: Cervical three-four, Cervical four-five Anterior cervical decompression/discectomy/fusion;  Surgeon: Maeola Harman, MD;  Location: [facility] OR;  Service: Neurosurgery;  Laterality: N/A;   CATARACT EXTRACTION W/ INTRAOCULAR LENS IMPLANT Right 2016   CATARACT EXTRACTION W/PHACO  12/16/2012   Procedure: CATARACT EXTRACTION PHACO AND INTRAOCULAR LENS PLACEMENT (IOC);  Surgeon: Gemma Payor, MD;  Location: [facility];  Service: Ophthalmology;  Laterality: Left;  CDE:17.31   COLONOSCOPY  10/03/2011   Procedure: COLONOSCOPY;  Surgeon: Dalia Heading;  [facility];  Service: Gastroenterology;  Laterality: N/A;   CYSTOSCOPY/RETROGRADE/URETEROSCOPY/STONE EXTRACTION WITH BASKET Right 03/29/2020   Procedure: CYSTOSCOPY/RETROGRADE/URETEROSCOPY/STONE EXTRACTION WITH BASKET;  Surgeon: Marcine Matar, MD;  Location[facility];  Service: Urology;  Laterality: Right;  1 HR   DECOMPRESSIOIN ULNAR NERVE AND CUBITAL TUNNEL RELEASE Left 07/14/2009   elbow   EP IMPLANTABLE DEVICE N/A 08/10/2015   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   HOLMIUM LASER APPLICATION Right 03/29/2020   Procedure: HOLMIUM LASER APPLICATION;  Surgeon: Marcine Matar, MD;  Location: [facility];  Service: Urology;  Laterality: Right;   INGUINAL HERNIA REPAIR Right 1990's   KNEE ARTHROSCOPY Right 2015   POSTERIOR LUMBAR FUSION     2014   L3--4;    12/ 2018   L4--5   SHOULDER ARTHROSCOPY Left 1990's   TEE WITHOUT CARDIOVERSION N/A 07/27/2015   Procedure: TRANSESOPHAGEAL ECHOCARDIOGRAM (TEE);  Surgeon: Lewayne Bunting, MD;  Location: [facility] ENDOSCOPY;  Service: Cardiovascular;  Laterality: N/A;   normal LV function, ef 55-60%,  mild AR and MR, mild dilated  ascending aorta (4.2cm),  mild to moderate atherosclerosis  descending aorta,  mild to moderate TR,  negative saline microcavitation study   THYROIDECTOMY Right 01/20/2015   Procedure: RIGHT THYROIDECTOMY;  Surgeon: Darletta Moll, MD;  Location: [facility] OR;  Service: ENT;  Laterality: Right;   TONSILLECTOMY  as child   TOTAL HIP ARTHROPLASTY Left 12/14/2020   Procedure: TOTAL HIP ARTHROPLASTY ANTERIOR APPROACH;  Surgeon: Sheral Apley, MD;  [facility];  Service: Orthopedics;  Laterality: Left;   TRANSURETHRAL RESECTION OF BLADDER TUMOR N/A 05/15/2016   Procedure: TRANSURETHRAL RESECTION OF BLADDER TUMOR (TURBT) AND INSTILLATION OF EPIRUBICIN;  Surgeon: Marcine Matar, MD;  Location: [facility];  Service: Urology;  Laterality: N/A;   TRANSURETHRAL RESECTION OF BLADDER TUMOR N/A 03/29/2020   Procedure: TRANSURETHRAL RESECTION OF BLADDER TUMOR (TURBT);  Surgeon: Marcine Matar, MD;  [facility];  Service: Urology;  Laterality: N/A;   TRANSURETHRAL RESECTION OF BLADDER TUMOR WITH MITOMYCIN-C N/A 01/23/2022   Procedure: TRANSURETHRAL RESECTION OF BLADDER TUMOR WITH POST OPERATIVE GEMCITABINE INSTILLATION;  Surgeon: Marcine Matar, MD;  [facility];  Service: Urology;  Laterality: N/A;    Home Medications:  Allergies as of 04/11/2023       Reactions   Flomax [tamsulosin Hcl] Nausea And Vomiting, Other (See Comments)   "Pt thought he was dying " DIZZY   Fentanyl Other (See Comments)   Makes me combative         Medication List      Notice   Cannot display discharge medications because the patient has not yet been admitted.     Allergies:  Allergies  Allergen Reactions   Flomax [Tamsulosin Hcl] Nausea And Vomiting and Other (See Comments)    "Pt thought he was dying " DIZZY   Fentanyl Other (See Comments)    Makes me combative     Family History  Problem Relation Age of Onset   Stroke Sister    Breast cancer Sister    Stroke Brother      Social History:  reports that he quit smoking about 12 years ago. His smoking use included cigarettes. He has a 45.00 pack-year smoking history. He has never used smokeless tobacco. He reports that he does not drink alcohol and does not use drugs.  ROS: A complete review of systems was performed.  All systems are negative except for pertinent findings as noted.  Physical Exam:  Vital signs in last 24 hours: There were no vitals taken for this visit. Constitutional:  Alert and oriented, No acute distress Cardiovascular: Regular rate  Respiratory: Normal respiratory effort Neurologic: Grossly intact, no focal deficits Psychiatric: Normal mood and affect  I have reviewed prior pt notes  I have independently reviewed prior imaging  I have reviewed prior pathology results   Impression/Assessment:  History of high-grade nonmuscle invasive bladder cancer with recent positive cytologies  Plan:  Cystoscopy, bilateral retrograde ureteropyelogram's, bladder biopsies

## 2023-04-12 ENCOUNTER — Encounter (HOSPITAL_COMMUNITY)
Admission: RE | Disposition: A | Discharge status: Home / Self Care | Payer: Self-pay | Source: Ambulatory Visit | Attending: Urology

## 2023-04-12 ENCOUNTER — Ambulatory Visit (HOSPITAL_COMMUNITY): Payer: Medicare Other | Admitting: Physician Assistant

## 2023-04-12 ENCOUNTER — Encounter (HOSPITAL_COMMUNITY): Payer: Self-pay | Admitting: Urology

## 2023-04-12 ENCOUNTER — Ambulatory Visit (HOSPITAL_BASED_OUTPATIENT_CLINIC_OR_DEPARTMENT_OTHER): Payer: Medicare Other | Admitting: Registered Nurse

## 2023-04-12 ENCOUNTER — Ambulatory Visit (HOSPITAL_COMMUNITY): Payer: Medicare Other

## 2023-04-12 ENCOUNTER — Ambulatory Visit (HOSPITAL_COMMUNITY)
Admission: RE | Admit: 2023-04-12 | Discharge: 2023-04-12 | Disposition: A | Discharge status: Home / Self Care | Payer: Medicare Other | Source: Ambulatory Visit | Attending: Urology | Admitting: Urology

## 2023-04-12 DIAGNOSIS — I739 Peripheral vascular disease, unspecified: Secondary | ICD-10-CM | POA: Diagnosis not present

## 2023-04-12 DIAGNOSIS — Z8551 Personal history of malignant neoplasm of bladder: Secondary | ICD-10-CM

## 2023-04-12 DIAGNOSIS — F32A Depression, unspecified: Secondary | ICD-10-CM | POA: Insufficient documentation

## 2023-04-12 DIAGNOSIS — C676 Malignant neoplasm of ureteric orifice: Secondary | ICD-10-CM | POA: Insufficient documentation

## 2023-04-12 DIAGNOSIS — I4891 Unspecified atrial fibrillation: Secondary | ICD-10-CM | POA: Insufficient documentation

## 2023-04-12 DIAGNOSIS — J449 Chronic obstructive pulmonary disease, unspecified: Secondary | ICD-10-CM | POA: Insufficient documentation

## 2023-04-12 DIAGNOSIS — I10 Essential (primary) hypertension: Secondary | ICD-10-CM | POA: Diagnosis not present

## 2023-04-12 DIAGNOSIS — M1990 Unspecified osteoarthritis, unspecified site: Secondary | ICD-10-CM | POA: Diagnosis not present

## 2023-04-12 DIAGNOSIS — D090 Carcinoma in situ of bladder: Secondary | ICD-10-CM

## 2023-04-12 DIAGNOSIS — Z87891 Personal history of nicotine dependence: Secondary | ICD-10-CM | POA: Diagnosis not present

## 2023-04-12 DIAGNOSIS — L538 Other specified erythematous conditions: Secondary | ICD-10-CM

## 2023-04-12 HISTORY — DX: Presence of external hearing-aid: Z97.4

## 2023-04-12 HISTORY — PX: CYSTOSCOPY WITH BIOPSY: SHX5122

## 2023-04-12 HISTORY — PX: CYSTOSCOPY W/ RETROGRADES: SHX1426

## 2023-04-12 HISTORY — DX: Emphysema, unspecified: J43.9

## 2023-04-12 HISTORY — DX: Unspecified osteoarthritis, unspecified site: M19.90

## 2023-04-12 HISTORY — DX: Other specified chronic obstructive pulmonary disease: J44.89

## 2023-04-12 HISTORY — DX: Malignant neoplasm of overlapping sites of bladder: C67.8

## 2023-04-12 HISTORY — DX: Chronic respiratory failure with hypoxia: J96.11

## 2023-04-12 HISTORY — DX: Dependence on supplemental oxygen: Z99.81

## 2023-04-12 SURGERY — CYSTOSCOPY, WITH BIOPSY
Anesthesia: General

## 2023-04-12 MED ORDER — FENTANYL CITRATE (PF) 100 MCG/2ML IJ SOLN
INTRAMUSCULAR | Status: DC | PRN
  Administered 2023-04-12: 50 ug via INTRAVENOUS

## 2023-04-12 MED ORDER — PROPOFOL 1000 MG/100ML IV EMUL
INTRAVENOUS | Status: AC
  Filled 2023-04-12: qty 100

## 2023-04-12 MED ORDER — CEFAZOLIN SODIUM-DEXTROSE 2-4 GM/100ML-% IV SOLN
2.0000 g | INTRAVENOUS | Status: AC
  Administered 2023-04-12: 2 g via INTRAVENOUS
  Filled 2023-04-12: qty 100

## 2023-04-12 MED ORDER — OXYCODONE HCL 5 MG/5ML PO SOLN: 5.0000 mg | Freq: Once | ORAL | Status: DC | PRN

## 2023-04-12 MED ORDER — DEXAMETHASONE SODIUM PHOSPHATE 10 MG/ML IJ SOLN
INTRAMUSCULAR | Status: AC
  Filled 2023-04-12: qty 1

## 2023-04-12 MED ORDER — DEXAMETHASONE SODIUM PHOSPHATE 10 MG/ML IJ SOLN
INTRAMUSCULAR | Status: DC | PRN
  Administered 2023-04-12: 8 mg via INTRAVENOUS

## 2023-04-12 MED ORDER — STERILE WATER FOR IRRIGATION IR SOLN
Status: DC | PRN
  Administered 2023-04-12: 3000 mL

## 2023-04-12 MED ORDER — ORAL CARE MOUTH RINSE: 15.0000 mL | Freq: Once | OROMUCOSAL | Status: AC

## 2023-04-12 MED ORDER — CEPHALEXIN 500 MG PO CAPS: 500.0000 mg | ORAL_CAPSULE | Freq: Two times a day (BID) | ORAL | 0 refills | Status: AC

## 2023-04-12 MED ORDER — IOHEXOL 300 MG/ML  SOLN
INTRAMUSCULAR | Status: DC | PRN
  Administered 2023-04-12: 17 mL

## 2023-04-12 MED ORDER — LACTATED RINGERS IV SOLN: INTRAVENOUS | Status: DC

## 2023-04-12 MED ORDER — CHLORHEXIDINE GLUCONATE 0.12 % MT SOLN
15.0000 mL | Freq: Once | OROMUCOSAL | Status: AC
  Administered 2023-04-12: 15 mL via OROMUCOSAL

## 2023-04-12 MED ORDER — OXYCODONE HCL 5 MG PO TABS: 5.0000 mg | ORAL_TABLET | Freq: Once | ORAL | Status: DC | PRN

## 2023-04-12 MED ORDER — LIDOCAINE HCL (PF) 2 % IJ SOLN
INTRAMUSCULAR | Status: AC
  Filled 2023-04-12: qty 5

## 2023-04-12 MED ORDER — PROPOFOL 10 MG/ML IV BOLUS
INTRAVENOUS | Status: DC | PRN
  Administered 2023-04-12: 125 ug/kg/min via INTRAVENOUS
  Administered 2023-04-12: 170 mg via INTRAVENOUS

## 2023-04-12 MED ORDER — 0.9 % SODIUM CHLORIDE (POUR BTL) OPTIME
TOPICAL | Status: DC | PRN
  Administered 2023-04-12: 1000 mL

## 2023-04-12 MED ORDER — ONDANSETRON HCL 4 MG/2ML IJ SOLN
INTRAMUSCULAR | Status: DC | PRN
  Administered 2023-04-12: 4 mg via INTRAVENOUS

## 2023-04-12 MED ORDER — PHENYLEPHRINE HCL-NACL 20-0.9 MG/250ML-% IV SOLN
INTRAVENOUS | Status: DC | PRN
  Administered 2023-04-12: 35 ug/min via INTRAVENOUS

## 2023-04-12 MED ORDER — FENTANYL CITRATE (PF) 100 MCG/2ML IJ SOLN
INTRAMUSCULAR | Status: AC
  Filled 2023-04-12: qty 2

## 2023-04-12 MED ORDER — ONDANSETRON HCL 4 MG/2ML IJ SOLN
INTRAMUSCULAR | Status: AC
  Filled 2023-04-12: qty 2

## 2023-04-12 MED ORDER — LIDOCAINE 2% (20 MG/ML) 5 ML SYRINGE
INTRAMUSCULAR | Status: DC | PRN
  Administered 2023-04-12: 60 mg via INTRAVENOUS

## 2023-04-12 MED ORDER — ONDANSETRON HCL 4 MG/2ML IJ SOLN: 4.0000 mg | Freq: Four times a day (QID) | INTRAMUSCULAR | Status: DC | PRN

## 2023-04-12 SURGICAL SUPPLY — 27 items
BAG COUNTER SPONGE SURGICOUNT (BAG) IMPLANT
BAG DRN RND TRDRP ANRFLXCHMBR (UROLOGICAL SUPPLIES)
BAG SPNG CNTER NS LX DISP (BAG)
BAG URINE DRAIN 2000ML AR STRL (UROLOGICAL SUPPLIES) IMPLANT
BAG URO CATCHER STRL LF (MISCELLANEOUS) ×3 IMPLANT
CATH FOLEY 2WAY SLVR  5CC 18FR (CATHETERS) ×2
CATH FOLEY 2WAY SLVR 30CC 24FR (CATHETERS) IMPLANT
CATH FOLEY 2WAY SLVR 5CC 18FR (CATHETERS) IMPLANT
CATH URETL OPEN END 6FR 70 (CATHETERS) ×3 IMPLANT
CLOTH BEACON ORANGE TIMEOUT ST (SAFETY) ×3 IMPLANT
DRAPE FOOT SWITCH (DRAPES) ×3 IMPLANT
EVACUATOR MICROVAS BLADDER (UROLOGICAL SUPPLIES) IMPLANT
GLOVE SURG LX STRL 8.0 MICRO (GLOVE) ×3 IMPLANT
GOWN STRL REUS W/ TWL XL LVL3 (GOWN DISPOSABLE) ×6 IMPLANT
GOWN STRL REUS W/TWL XL LVL3 (GOWN DISPOSABLE) ×4
GUIDEWIRE STR DUAL SENSOR (WIRE) ×3 IMPLANT
KIT TURNOVER KIT A (KITS) IMPLANT
LOOP CUT BIPOLAR 24F LRG (ELECTROSURGICAL) ×3 IMPLANT
MANIFOLD NEPTUNE II (INSTRUMENTS) ×3 IMPLANT
NDL SAFETY ECLIP 18X1.5 (MISCELLANEOUS) ×3 IMPLANT
PACK CYSTO (CUSTOM PROCEDURE TRAY) ×3 IMPLANT
PENCIL SMOKE EVACUATOR (MISCELLANEOUS) IMPLANT
SYR TOOMEY IRRIG 70ML (MISCELLANEOUS)
SYRINGE TOOMEY IRRIG 70ML (MISCELLANEOUS) IMPLANT
TUBING CONNECTING 10 (TUBING) ×3 IMPLANT
TUBING UROLOGY SET (TUBING) ×3 IMPLANT
WATER STERILE IRR 3000ML UROMA (IV SOLUTION) ×3 IMPLANT

## 2023-04-12 NOTE — Transfer of Care (Signed)
Immediate Anesthesia Transfer of Care Note  Patient: Kyle Owen  Procedure(s) Performed: CYSTOSCOPY WITH BLADDER BIOPSY CYSTOSCOPY WITH RETROGRADE PYELOGRAM (Bilateral)  Patient Location: PACU  Anesthesia Type:General  Level of Consciousness: awake, alert , oriented, and patient cooperative  Airway & Oxygen Therapy: Patient Spontanous Breathing and Patient connected to face mask oxygen  Post-op Assessment: Report given to RN, Post -op Vital signs reviewed and stable, and Patient moving all extremities  Post vital signs: Reviewed and stable  Last Vitals:  Vitals Value Taken Time  BP 121/69 04/12/23 1336  Temp    Pulse 56 04/12/23 1338  Resp 12 04/12/23 1338  SpO2 100 % 04/12/23 1338  Vitals shown include unvalidated device data.  Last Pain:  Vitals:   04/12/23 1046  TempSrc:   PainSc: 0-No pain         Complications: No notable events documented.

## 2023-04-12 NOTE — Discharge Instructions (Addendum)

## 2023-04-12 NOTE — Anesthesia Procedure Notes (Signed)
Procedure Name: LMA Insertion Date/Time: 04/12/2023 12:51 PM  Performed by: Elisabeth Cara, CRNAPre-anesthesia Checklist: Patient identified, Emergency Drugs available, Suction available, Patient being monitored and Timeout performed Patient Re-evaluated:Patient Re-evaluated prior to induction Oxygen Delivery Method: Circle system utilized Preoxygenation: Pre-oxygenation with 100% oxygen Induction Type: IV induction LMA: LMA inserted LMA Size: 5.0 Number of attempts: 1 Placement Confirmation: positive ETCO2 and breath sounds checked- equal and bilateral Tube secured with: Tape Dental Injury: Teeth and Oropharynx as per pre-operative assessment

## 2023-04-12 NOTE — Op Note (Signed)
Preoperative diagnosis: History of high-grade nonmuscle invasive bladder cancer with recent positive cytologies  Postoperative diagnosis: Same, with erythematous lesion in right supratrigonal region  Principal procedure: Cystoscopy, bladder washings, bilateral retrograde ureteropyelogram's, fluoroscopic interpretation, biopsy of bladder  Surgeon: Shunna Mikaelian  Anesthesia: General with LMA  Complications: None  Estimated blood loss: Less than 5 mL  Specimen: 1.  Bladder washings for cytology 2.  Bladder biopsies for permanent pathology  Drains: None  Indication: 82 year old male with history of bladder cancer-initial resections revealed noninvasive low-grade bladder cancer.  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  Recent cystoscopy revealed mild erythema old scar which was not significantly worrisome.  However, cytology came back suspicious for high-grade urothelial carcinoma.  He presents at this time for definitive biopsy, retrograde studies of his upper tracts.  I discussed the procedure, expected outcomes with the patient and his wife, risks and complications.  They understand and desire to proceed.  Findings: Retrograde studies of both pyelocalyceal systems and ureters was normal.  Normal caliber of ureters, no filling defects or stricture.  Pyelocalyceal systems were normal, delicate bilaterally without filling defects.  Urothelium of the bladder was normal except for a 2 cm area associated with prior resection site just above the right ureteral orifice.  No raised lesions but there was mild erythema and patches.  No other urothelial abnormalities noted within the bladder.  Ureteral orifices were normal in their configuration and location.  Description of procedure: Patient properly identified in the holding area.  Taken to the operating room where general anesthetic was administered with the LMA.  He was placed in the dorsolithotomy position.  Genitalia and perineum were prepped, draped,  IV antibiotics administered and proper timeout performed.  21 French panendoscope passed under direct vision.  Urethra was normal.  No lesions, no stricture.  Prostate nonobstructive.  No lesions within the prostatic urethra.  Bladder inspected circumferentially.  Washings taken using a [place]y syringe and saline.  These were sent for cytology labeled "bladder washings".  Systematic inspection of the bladder was performed with the above-mentioned findings noted.  Bilateral ureteral pyelograms performed in retrograde fashion using a 6 [place] open-ended catheter and Omnipaque.  Above-mentioned findings noted.  The only biopsies I took were from this area just above the right ureteral orifice.  Cold cup biopsies were utilized to remove all erythematous tissue.  Approximately 10 biopsies taken.  These were sent to pathology labeled "bladder biopsies".  Biopsy sites cauterized with the Bugbee electrode.  Adequate hemostasis noted.  At this point the procedure was terminated.  The bladder was drained by removing the scope, 18 French Foley catheter placed.  10 cc placed in the balloon, hooked to dependent drainage.  At this point the patient was awakened, taken to the PACU in stable condition having tolerated the procedure well.

## 2023-04-12 NOTE — Anesthesia Postprocedure Evaluation (Signed)
Anesthesia Post Note  Patient: Kyle Owen  Procedure(s) Performed: CYSTOSCOPY WITH BLADDER BIOPSY CYSTOSCOPY WITH RETROGRADE PYELOGRAM (Bilateral)     Patient location during evaluation: PACU Anesthesia Type: General Level of consciousness: awake and alert Pain management: pain level controlled Vital Signs Assessment: post-procedure vital signs reviewed and stable Respiratory status: spontaneous breathing, nonlabored ventilation, respiratory function stable and patient connected to nasal cannula oxygen Cardiovascular status: blood pressure returned to baseline and stable Postop Assessment: no apparent nausea or vomiting Anesthetic complications: no   No notable events documented.  Last Vitals:  Vitals:   04/12/23 1430 04/12/23 1431  BP: 128/80 128/80  Pulse:  (!) 57  Resp:  16  Temp: (!) 36.4 C (!) 36.4 C  SpO2:  93%    Last Pain:  Vitals:   04/12/23 1431  TempSrc:   PainSc: 0-No pain                 [name] S

## 2023-04-12 NOTE — Interval H&P Note (Signed)
History and Physical Interval Note:  04/12/2023 11:21 AM  Kyle Owen  has presented today for surgery, with the diagnosis of BLADDER CANCER.  The various methods of treatment have been discussed with the patient and family. After consideration of risks, benefits and other options for treatment, the patient has consented to  Procedure(s) with comments: CYSTOSCOPY WITH BIOPSY (N/A) - 45 MINS CYSTOSCOPY WITH RETROGRADE PYELOGRAM (Bilateral) as a surgical intervention.  The patient's history has been reviewed, patient examined, no change in status, stable for surgery.  I have reviewed the patient's chart and labs.  Questions were answered to the patient's satisfaction.     Bertram Millard Ethleen Lormand

## 2023-04-13 ENCOUNTER — Encounter (HOSPITAL_COMMUNITY): Payer: Self-pay | Admitting: Urology

## 2023-04-13 LAB — SURGICAL PATHOLOGY

## 2023-04-13 LAB — CYTOLOGY - NON PAP

## 2023-04-29 NOTE — Progress Notes (Signed)
History of Present Illness:   4.5.20221-Underwent TURBT/Rt URS/stone mgmt.   Initial TURBT of a 2 cm papillary bladder tumor on 5.22.2017.  Pathology revealed papillary low-grade NMIBC. Epirubicin was placed post resection.   4.5.2021: He underwent cystoscopy, biopsy of papillary lesions at the right ureteral orifice (path--urothelial papilloma), right ureteroscopy with holmium laser and extraction of stone as well as stent placement.  He has since remove the stent.  He has passed multiple small fragments.   7.12.2022: Cystoscopy negative  12.13.2022: Here early for follow-up.  Had 3 to 4 days of total gross painless hematuria which cleared up about a week ago.  No change in urinary pattern, no flank or abdominal symptoms at that time.  His CT scan for hematuria evaluation was negative.   1.30.2023: He underwent cystoscopy, bladder biopsy and TUR of papillary lesions within the prostatic urethra.  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  2.14.2023: He has urgency and frequency since his procedure but no current dysuria or gross hematuria.  His wife and daughter accompany him to the visit today.   3.28.2023: Completed BCG induction   5.9.2023: Cystoscopy negative.  Cytology revealed atypical cells.   6.28.2023: Completed maintenance BCG   8.15.2023: Cystoscopy negative, cytology revealed atypia but no evidence of high-grade urothelial carcinoma   9.26.2023: Completed maintenance BCG.  He tolerated these well.   11.7.2023: Cysto negative. Cytology--atypical cells.   1.22.2024: Completed maintenance BCG.   3.5.2024: Cytologies +  4.16.2024: Cysto, bladder bx (small lesion), (B) RGPs, bladder washing. Biopsies, cytologies positive for CIS. RGPs normal.  5.7.2024:    Past Medical History:  Diagnosis Date   A-fib (HCC)    Anticoagulant long-term use    eliquis--- managed by cardiology   Benign prostatic  hypertrophy with urinary frequency    Chronic respiratory failure with hypoxia (HCC)    Complication of anesthesia    gets combative in recovery   COPD with chronic bronchitis and emphysema (HCC)    pulmonologist--- dr Vassie Loll;  nocturnal oxygen,  no daytime oxygen use   Dependence on nocturnal oxygen therapy    Depression    Dyspnea    when walking   History of colon polyps    History of kidney stones    History of loop recorder    loop recorder in place battery is dead has not had changed due to covid   History of TIA (transient ischemic attack)    (per pt no residual ) per neurologist note (dr Marjory Lies 11-08-2015) dx cryptogenic TIA versus arrhythia versus dysautonomia (right ophthalmic artery TIA and x3 posterior circulation TIAs   Hyperlipidemia    Hypertension    Pt denies   Malignant neoplasm of overlapping sites of bladder [facility])    urologist--- dr Retta Diones   Multinodular thyroid    per pathology report 11-12-2014 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    Nephrolithiasis    OA (osteoarthritis)    DJD right knee   Ocular migraine    controlled w/ verapamil   On home oxygen therapy    PAF (paroxysmal atrial fibrillation) (HCC)    followed by AFIB clinic-- cardiologist-- dr Anne Fu   Pneumonia    2023   Pulmonary nodule, left    left lower lobe x2 per CT 01-20-2016   Renal cyst, left    Stroke [facility])    Thoracic aortic aneurysm without rupture [facility])    followed by dr Zenaida Niece tright ;   ascending --  ct chest 01-24-2023   4.8cm   Wears hearing aid in both ears    wear at times    Past Surgical History:  Procedure Laterality Date   ANTERIOR CERVICAL DECOMP/DISCECTOMY FUSION N/A 04/03/2017   Procedure: Cervical three-four, Cervical four-five Anterior cervical decompression/discectomy/fusion;  Surgeon: Maeola Harman, MD;  Location: [facility];  Service: Neurosurgery;  Laterality: N/A;   CATARACT EXTRACTION W/ INTRAOCULAR LENS IMPLANT Right 2016    CATARACT EXTRACTION W/PHACO  12/16/2012   Procedure: CATARACT EXTRACTION PHACO AND INTRAOCULAR LENS PLACEMENT (IOC);  Surgeon: Gemma Payor, MD;  Location: [facility] ORS;  Service: Ophthalmology;  Laterality: Left;  CDE:17.31   COLONOSCOPY  10/03/2011   Procedure: COLONOSCOPY;  Surgeon: Dalia Heading;  Location: [facility] ENDO SUITE;  Service: Gastroenterology;  Laterality: N/A;   CYSTOSCOPY W/ RETROGRADES Bilateral 04/12/2023   Procedure: CYSTOSCOPY WITH RETROGRADE PYELOGRAM;  Surgeon: Marcine Matar, MD;  Location: [facility] ORS;  Service: Urology;  Laterality: Bilateral;   CYSTOSCOPY WITH BIOPSY N/A 04/12/2023   Procedure: CYSTOSCOPY WITH BLADDER BIOPSY;  Surgeon: Marcine Matar, MD;  Location: [facility] ORS;  Service: Urology;  Laterality: N/A;  45 MINS   CYSTOSCOPY/RETROGRADE/URETEROSCOPY/STONE EXTRACTION WITH BASKET Right 03/29/2020   Procedure: CYSTOSCOPY/RETROGRADE/URETEROSCOPY/STONE EXTRACTION WITH BASKET;  Surgeon: Marcine Matar, MD;  Location: [facility] ORS;  Service: Urology;  Laterality: Right;  1 HR   DECOMPRESSIOIN ULNAR NERVE AND CUBITAL TUNNEL RELEASE Left 07/14/2009   elbow   EP IMPLANTABLE DEVICE N/A 08/10/2015   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   HOLMIUM LASER APPLICATION Right 03/29/2020   Procedure: HOLMIUM LASER APPLICATION;  Surgeon: Marcine Matar, MD;  Location: [facility] ORS;  Service: Urology;  Laterality: Right;   INGUINAL HERNIA REPAIR Right 1990's   KNEE ARTHROSCOPY Right 2015   POSTERIOR LUMBAR FUSION     2014   L3--4;    12/ 2018   L4--5   SHOULDER ARTHROSCOPY Left 1990's   TEE WITHOUT CARDIOVERSION N/A 07/27/2015   Procedure: TRANSESOPHAGEAL ECHOCARDIOGRAM (TEE);  Surgeon: Lewayne Bunting, MD;  Location[facility] ENDOSCOPY;  Service: Cardiovascular;  Laterality: N/A;   normal LV function, ef 55-60%,  mild AR and MR, mild dilated ascending aorta (4.2cm),  mild to moderate atherosclerosis  descending aorta,  mild to moderate TR,  negative saline microcavitation study    THYROIDECTOMY Right 01/20/2015   Procedure: RIGHT THYROIDECTOMY;  Surgeon: Darletta Moll, MD;  [facility] OR;  Service: ENT;  Laterality: Right;   TONSILLECTOMY  as child   TOTAL HIP ARTHROPLASTY Left 12/14/2020   Procedure: TOTAL HIP ARTHROPLASTY ANTERIOR APPROACH;  Surgeon: Sheral Apley, MD;  Location: [facility] ORS;  Service: Orthopedics;  Laterality: Left;   TRANSURETHRAL RESECTION OF BLADDER TUMOR N/A 05/15/2016   Procedure: TRANSURETHRAL RESECTION OF BLADDER TUMOR (TURBT) AND INSTILLATION OF EPIRUBICIN;  Surgeon: Marcine Matar, MD;  Location: [facility];  Service: Urology;  Laterality: N/A;   TRANSURETHRAL RESECTION OF BLADDER TUMOR N/A 03/29/2020   Procedure: TRANSURETHRAL RESECTION OF BLADDER TUMOR (TURBT);  Surgeon: Marcine Matar, MD;  Location: [facility] ORS;  Service: Urology;  Laterality: N/A;   TRANSURETHRAL RESECTION OF BLADDER TUMOR WITH MITOMYCIN-C N/A 01/23/2022   Procedure: TRANSURETHRAL RESECTION OF BLADDER TUMOR WITH POST OPERATIVE GEMCITABINE INSTILLATION;  Surgeon: Marcine Matar, MD;  Location: [facility] ORS;  Service: Urology;  Laterality: N/A;    Home Medications:  Allergies as of 05/01/2023       Reactions   Flomax [tamsulosin Hcl] Nausea And Vomiting, Other (See Comments)   "Pt thought he  was dying " DIZZY   Fentanyl Other (See Comments)   Makes me combative         Medication List        Accurate as of Apr 29, 2023  4:41 PM. If you have any questions, ask your nurse or doctor.          acetaminophen 500 MG tablet Commonly known as: TYLENOL Take 500 mg by mouth in the morning and at bedtime.   atorvastatin 20 MG tablet Commonly known as: LIPITOR TAKE 1 TABLET BY MOUTH DAILY.   budesonide-formoterol 160-4.5 MCG/ACT inhaler Commonly known as: Symbicort Inhale 2 puffs into the lungs 2 (two) times daily.   cetirizine 10 MG tablet Commonly known as: ZYRTEC Take 10 mg by mouth daily.   DEEP BLUE RELIEF EX Apply 1 Application topically  3 (three) times daily as needed (pain).   Eliquis 5 MG Tabs tablet Generic drug: apixaban Take 5 mg by mouth 2 (two) times daily.   finasteride 5 MG tablet Commonly known as: PROSCAR Take 5 mg by mouth in the morning.   Magnesium 250 MG Tabs Take 250 mg by mouth in the morning and at bedtime.   OXYGEN Inhale 2.5 L into the lungs at bedtime.   sertraline 50 MG tablet Commonly known as: ZOLOFT Take 50 mg by mouth every morning.   Ventolin HFA 108 (90 Base) MCG/ACT inhaler Generic drug: albuterol Inhale 2 puffs into the lungs Every 4 hours as needed for wheezing or shortness of breath.   verapamil 240 MG CR tablet Commonly known as: CALAN-SR Take 240 mg by mouth in the morning.   Vitamin D 50 MCG (2000 UT) tablet Take 2,000 Units by mouth in the morning.        Allergies:  Allergies  Allergen Reactions   Flomax [Tamsulosin Hcl] Nausea And Vomiting and Other (See Comments)    "Pt thought he was dying " DIZZY   Fentanyl Other (See Comments)    Makes me combative     Family History  Problem Relation Age of Onset   Stroke Sister    Breast cancer Sister    Stroke Brother     Social History:  reports that he quit smoking about 12 years ago. His smoking use included cigarettes. He has a 45.00 pack-year smoking history. He has never used smokeless tobacco. He reports that he does not drink alcohol and does not use drugs.  ROS: A complete review of systems was performed.  All systems are negative except for pertinent findings as noted.  Physical Exam:  Vital signs in last 24 hours: There were no vitals taken for this visit. Constitutional:  Alert and oriented, No acute distress Cardiovascular: Regular rate  Respiratory: Normal respiratory effort GI: Abdomen is soft, nontender, nondistended, no abdominal masses. No CVAT.  Genitourinary: Normal male phallus, testes are descended bilaterally and non-tender and without masses, scrotum is normal in appearance without  lesions or masses, perineum is normal on inspection. Lymphatic: No lymphadenopathy Neurologic: Grossly intact, no focal deficits Psychiatric: Normal mood and affect  I have reviewed prior pt notes  I have reviewed notes from referring/previous physicians  I have reviewed urinalysis results  I have independently reviewed prior imaging  I have reviewed prior PSA results  I have reviewed prior urine culture   Impression/Assessment:  ***  Plan:  ***

## 2023-05-01 ENCOUNTER — Encounter: Payer: Self-pay | Admitting: Urology

## 2023-05-01 ENCOUNTER — Ambulatory Visit: Payer: Medicare Other | Admitting: Urology

## 2023-05-01 VITALS — BP 97/54 | HR 82 | Ht 73.0 in | Wt 194.4 lb

## 2023-05-01 DIAGNOSIS — D090 Carcinoma in situ of bladder: Secondary | ICD-10-CM | POA: Diagnosis not present

## 2023-05-01 LAB — URINALYSIS, ROUTINE W REFLEX MICROSCOPIC
Bilirubin, UA: NEGATIVE
Glucose, UA: NEGATIVE
Ketones, UA: NEGATIVE
Nitrite, UA: NEGATIVE
Protein,UA: NEGATIVE
Specific Gravity, UA: 1.02 (ref 1.005–1.030)
Urobilinogen, Ur: 0.2 mg/dL (ref 0.2–1.0)
pH, UA: 5 (ref 5.0–7.5)

## 2023-05-01 LAB — MICROSCOPIC EXAMINATION

## 2023-05-03 LAB — URINE CULTURE: Organism ID, Bacteria: NO GROWTH

## 2023-05-04 ENCOUNTER — Telehealth: Payer: Self-pay

## 2023-05-04 NOTE — Telephone Encounter (Signed)
-----   Message from Marcine Matar, MD sent at 05/04/2023 11:48 AM EDT ----- Let pt know that he did not have UTI ----- Message ----- From: Troy Sine, CMA Sent: 05/03/2023   8:42 AM EDT To: Marcine Matar, MD  Please review

## 2023-05-04 NOTE — Telephone Encounter (Signed)
Made patient's wife Jamesetta So aware of Dr. Retta Diones recommendation. Jamesetta So voiced understanding

## 2023-05-08 ENCOUNTER — Ambulatory Visit (INDEPENDENT_AMBULATORY_CARE_PROVIDER_SITE_OTHER): Payer: Medicare Other

## 2023-05-08 DIAGNOSIS — C679 Malignant neoplasm of bladder, unspecified: Secondary | ICD-10-CM | POA: Diagnosis not present

## 2023-05-08 LAB — URINALYSIS, ROUTINE W REFLEX MICROSCOPIC
Bilirubin, UA: NEGATIVE
Glucose, UA: NEGATIVE
Ketones, UA: NEGATIVE
Nitrite, UA: NEGATIVE
Protein,UA: NEGATIVE
Specific Gravity, UA: 1.025 (ref 1.005–1.030)
Urobilinogen, Ur: 0.2 mg/dL (ref 0.2–1.0)
pH, UA: 5.5 (ref 5.0–7.5)

## 2023-05-08 MED ORDER — BCG LIVE 50 MG IS SUSR
3.2400 mL | Freq: Once | INTRAVESICAL | Status: AC
Start: 2023-05-08 — End: 2023-05-08
  Administered 2023-05-08: 81 mg via INTRAVESICAL

## 2023-05-08 NOTE — Progress Notes (Signed)
BCG Bladder Instillation  BCG # 1  Due to Bladder Cancer patient is present today for a BCG treatment. Patient was cleaned and prepped in a sterile fashion with betadine. A 14  FR catheter was inserted, urine return was noted 50 ml, urine was yellow in color.  50ml of reconstituted BCG was instilled into the bladder. The catheter was then removed. Patient tolerated well, no complications were noted  Performed by: Maryland Pink   CMA Guss Bunde CMA  Follow up/ Additional notes: F/UP as scheduled

## 2023-05-15 ENCOUNTER — Ambulatory Visit (INDEPENDENT_AMBULATORY_CARE_PROVIDER_SITE_OTHER): Payer: Medicare Other

## 2023-05-15 DIAGNOSIS — C679 Malignant neoplasm of bladder, unspecified: Secondary | ICD-10-CM | POA: Diagnosis not present

## 2023-05-15 LAB — URINALYSIS, ROUTINE W REFLEX MICROSCOPIC
Bilirubin, UA: NEGATIVE
Glucose, UA: NEGATIVE
Ketones, UA: NEGATIVE
Leukocytes,UA: NEGATIVE
Nitrite, UA: NEGATIVE
Protein,UA: NEGATIVE
Specific Gravity, UA: 1.025 (ref 1.005–1.030)
Urobilinogen, Ur: 0.2 mg/dL (ref 0.2–1.0)
pH, UA: 5.5 (ref 5.0–7.5)

## 2023-05-15 MED ORDER — BCG LIVE 50 MG IS SUSR
3.2400 mL | Freq: Once | INTRAVESICAL | Status: AC
Start: 2023-05-15 — End: 2023-05-15
  Administered 2023-05-15: 81 mg via INTRAVESICAL

## 2023-05-15 NOTE — Progress Notes (Signed)
BCG Bladder Instillation  BCG # 2  Due to Bladder Cancer patient is present today for a BCG treatment. Patient was cleaned and prepped in a sterile fashion with betadine. A 14 FR catheter was inserted, urine return was noted 50 ml, urine was yellow in color.  50ml of reconstituted BCG was instilled into the bladder. The catheter was then removed. Patient tolerated well, no complications were noted  Performed by: Maryland Pink RMA  Follow up/ Additional notes: F/UP as Nurse schedule

## 2023-05-18 ENCOUNTER — Telehealth: Payer: Self-pay

## 2023-05-18 NOTE — Telephone Encounter (Addendum)
This patient is asking why there are no BCG treatments from 3-6.  Please advise.  Call wife - Kyle Owen [phone]

## 2023-05-23 ENCOUNTER — Ambulatory Visit (INDEPENDENT_AMBULATORY_CARE_PROVIDER_SITE_OTHER): Payer: Medicare Other

## 2023-05-23 DIAGNOSIS — C679 Malignant neoplasm of bladder, unspecified: Secondary | ICD-10-CM

## 2023-05-23 LAB — URINALYSIS, ROUTINE W REFLEX MICROSCOPIC
Bilirubin, UA: NEGATIVE
Glucose, UA: NEGATIVE
Ketones, UA: NEGATIVE
Leukocytes,UA: NEGATIVE
Nitrite, UA: NEGATIVE
Protein,UA: NEGATIVE
RBC, UA: NEGATIVE
Specific Gravity, UA: 1.025 (ref 1.005–1.030)
Urobilinogen, Ur: 0.2 mg/dL (ref 0.2–1.0)
pH, UA: 5.5 (ref 5.0–7.5)

## 2023-05-23 MED ORDER — BCG LIVE 50 MG IS SUSR
3.2400 mL | Freq: Once | INTRAVESICAL | Status: AC
Start: 2023-05-23 — End: 2023-05-23
  Administered 2023-05-23: 81 mg via INTRAVESICAL

## 2023-05-23 NOTE — Progress Notes (Cosign Needed Addendum)
BCG Bladder Instillation  BCG # 3 of 6  Due to Bladder Cancer patient is present today for a BCG treatment. Patient was cleaned and prepped in a sterile fashion with betadine. A 14 FR catheter was inserted, urine return was noted 25 ml, urine was yellow in color.  50ml of reconstituted BCG was instilled into the bladder. The catheter was then removed. Patient tolerated well, no complications were noted  Performed by: Kennyth Lose, CMA  Follow up/ Additional notes: Keep scheduled Nurse Visit

## 2023-05-28 ENCOUNTER — Ambulatory Visit (INDEPENDENT_AMBULATORY_CARE_PROVIDER_SITE_OTHER): Payer: Medicare Other

## 2023-05-28 ENCOUNTER — Other Ambulatory Visit: Payer: Self-pay

## 2023-05-28 DIAGNOSIS — C679 Malignant neoplasm of bladder, unspecified: Secondary | ICD-10-CM | POA: Diagnosis not present

## 2023-05-28 LAB — MICROSCOPIC EXAMINATION: Bacteria, UA: NONE SEEN

## 2023-05-28 LAB — URINALYSIS, ROUTINE W REFLEX MICROSCOPIC
Bilirubin, UA: NEGATIVE
Glucose, UA: NEGATIVE
Ketones, UA: NEGATIVE
Leukocytes,UA: NEGATIVE
Nitrite, UA: NEGATIVE
Protein,UA: NEGATIVE
Specific Gravity, UA: 1.025 (ref 1.005–1.030)
Urobilinogen, Ur: 0.2 mg/dL (ref 0.2–1.0)
pH, UA: 5.5 (ref 5.0–7.5)

## 2023-05-28 MED ORDER — BCG LIVE 50 MG IS SUSR
3.2400 mL | Freq: Once | INTRAVESICAL | Status: AC
Start: 2023-05-28 — End: 2023-05-28
  Administered 2023-05-28: 81 mg via INTRAVESICAL

## 2023-05-28 NOTE — Progress Notes (Signed)
BCG Bladder Instillation  BCG # 4 of 6  Due to Bladder Cancer patient is present today for a BCG treatment. Patient was cleaned and prepped in a sterile fashion with betadine. A 14 FR catheter was inserted, urine return was noted 1 ml, urine was yellow in color.  50ml of reconstituted BCG was instilled into the bladder. The catheter was then removed. Patient tolerated well, no complications were noted  Performed by: Maryland Pink RMA  Follow up/ Additional notes: F/UP as nurse visit scheduled

## 2023-06-05 ENCOUNTER — Ambulatory Visit (INDEPENDENT_AMBULATORY_CARE_PROVIDER_SITE_OTHER): Payer: Medicare Other

## 2023-06-05 DIAGNOSIS — C679 Malignant neoplasm of bladder, unspecified: Secondary | ICD-10-CM | POA: Diagnosis not present

## 2023-06-05 LAB — URINALYSIS, ROUTINE W REFLEX MICROSCOPIC
Bilirubin, UA: NEGATIVE
Glucose, UA: NEGATIVE
Ketones, UA: NEGATIVE
Leukocytes,UA: NEGATIVE
Nitrite, UA: NEGATIVE
Protein,UA: NEGATIVE
RBC, UA: NEGATIVE
Specific Gravity, UA: 1.025 (ref 1.005–1.030)
Urobilinogen, Ur: 0.2 mg/dL (ref 0.2–1.0)
pH, UA: 5.5 (ref 5.0–7.5)

## 2023-06-05 MED ORDER — BCG LIVE 50 MG IS SUSR
3.2400 mL | Freq: Once | INTRAVESICAL | Status: AC
Start: 2023-06-05 — End: 2023-06-05
  Administered 2023-06-05: 81 mg via INTRAVESICAL

## 2023-06-05 NOTE — Progress Notes (Signed)
BCG Bladder Instillation  BCG # 5 of 6  Due to Bladder Cancer patient is present today for a BCG treatment. Patient was cleaned and prepped in a sterile fashion with betadine. A 14 FR catheter was inserted, urine return was noted 100 ml, urine was yellow in color.  50ml of reconstituted BCG was instilled into the bladder. The catheter was then removed. Patient tolerated well, no complications were noted  Performed by: Johanny Segers LPN  Follow up/ Additional notes: keep next scheduled [place]

## 2023-06-05 NOTE — Patient Instructions (Signed)

## 2023-06-12 ENCOUNTER — Ambulatory Visit (INDEPENDENT_AMBULATORY_CARE_PROVIDER_SITE_OTHER): Payer: Medicare Other

## 2023-06-12 DIAGNOSIS — C679 Malignant neoplasm of bladder, unspecified: Secondary | ICD-10-CM | POA: Diagnosis not present

## 2023-06-12 LAB — URINALYSIS, ROUTINE W REFLEX MICROSCOPIC
Bilirubin, UA: NEGATIVE
Glucose, UA: NEGATIVE
Ketones, UA: NEGATIVE
Leukocytes,UA: NEGATIVE
Nitrite, UA: NEGATIVE
Protein,UA: NEGATIVE
RBC, UA: NEGATIVE
Specific Gravity, UA: 1.025 (ref 1.005–1.030)
Urobilinogen, Ur: 0.2 mg/dL (ref 0.2–1.0)
pH, UA: 5.5 (ref 5.0–7.5)

## 2023-06-12 MED ORDER — BCG LIVE 50 MG IS SUSR
3.2400 mL | Freq: Once | INTRAVESICAL | Status: AC
Start: 2023-06-12 — End: 2023-06-12
  Administered 2023-06-12: 81 mg via INTRAVESICAL

## 2023-06-12 NOTE — Progress Notes (Signed)
BCG Bladder Instillation  BCG # 6 of 6  Due to Bladder Cancer patient is present today for a BCG treatment. Patient was cleaned and prepped in a sterile fashion with betadine. A 16FR catheter was inserted, urine return was noted 40ml, urine was yellow in color.  50ml of reconstituted BCG was instilled into the bladder. The catheter was then removed. Patient tolerated well, no complications were noted  Performed by: Jill Side RN  Follow up/ Additional notes: follow up with MD

## 2023-06-12 NOTE — Patient Instructions (Signed)

## 2023-07-10 NOTE — Progress Notes (Signed)
BCG Bladder Instillation  BCG # 6 of 6  Due to Bladder Cancer patient is present today for a BCG treatment. Patient was cleaned and prepped in a sterile fashion with betadine. A 16FR catheter was inserted, urine return was noted 40ml, urine was yellow in color.  50ml of reconstituted BCG was instilled into the bladder. The catheter was then removed. Patient tolerated well, no complications were noted  Performed by: Jill Side RN  Follow up/ Additional notes: follow up with MD

## 2023-08-27 NOTE — Progress Notes (Signed)
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   Initial TURBT of a 2 cm papillary bladder tumor on 5.22.2017.  Pathology revealed papillary low-grade NMIBC. Epirubicin was placed post resection.   4.5.2021: He underwent cystoscopy, biopsy of papillary lesions at the right ureteral orifice (path--urothelial papilloma), right ureteroscopy with holmium laser and extraction of stone as well as stent placement.  He has since remove the stent.  He has passed multiple small fragments.   7.12.2022: Cystoscopy negative  12.13.2022: Here early for follow-up.  Had 3 to 4 days of total gross painless hematuria which cleared up about a week ago.  No change in urinary pattern, no flank or abdominal symptoms at that time.  His CT scan for hematuria evaluation was negative.   1.30.2023: He underwent cystoscopy, bladder biopsy and TUR of papillary lesions within the prostatic urethra.  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  2.14.2023: He has urgency and frequency since his procedure but no current dysuria or gross hematuria.  His wife and daughter accompany him to the visit today.   3.28.2023: Completed BCG induction   5.9.2023: Cystoscopy negative.  Cytology revealed atypical cells.   6.28.2023: Completed maintenance BCG   8.15.2023: Cystoscopy negative, cytology revealed atypia but no evidence of high-grade urothelial carcinoma   9.26.2023: Completed maintenance BCG.  He tolerated these well.   11.7.2023: Cysto negative. Cytology--atypical cells.   1.22.2024: Completed maintenance BCG.   3.5.2024: Cytologies +  4.16.2024: Cysto, bladder bx (small lesion), (B) RGPs, bladder washing. Biopsies, cytologies positive for CIS. RGPs normal.  6.18.2024: Completed 2nd induction BCG series (6 instillations).  9.3.2024: Here for cysto.  Past Medical  History:  Diagnosis Date   A-fib (HCC)    Anticoagulant long-term use    eliquis--- managed by cardiology   Benign prostatic hypertrophy with urinary frequency    Chronic respiratory failure with hypoxia (HCC)    Complication of anesthesia    gets combative in recovery   COPD with chronic bronchitis and emphysema (HCC)    pulmonologist--- dr Vassie Loll;  nocturnal oxygen,  no daytime oxygen use   Dependence on nocturnal oxygen therapy    Depression    Dyspnea    when walking   History of colon polyps    History of kidney stones    History of loop recorder    loop recorder in place battery is dead has not had changed due to covid   History of TIA (transient ischemic attack)    (per pt no residual ) per neurologist note (dr Marjory Lies 11-08-2015) dx cryptogenic TIA versus arrhythia versus dysautonomia (right ophthalmic artery TIA and x3 posterior circulation TIAs   Hyperlipidemia    Hypertension    Pt denies   Malignant neoplasm of overlapping sites of bladder [facility])    urologist--- dr Retta Diones   Multinodular thyroid    per pathology report 11-12-2014 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    Nephrolithiasis    OA (osteoarthritis)    DJD right knee   Ocular migraine    controlled w/ verapamil   On home oxygen therapy    PAF (paroxysmal atrial fibrillation) (HCC)    followed by AFIB clinic-- cardiologist-- dr Anne Fu   Pneumonia    2023   Pulmonary nodule, left    left lower lobe x2 per CT 01-20-2016   Renal cyst, left    Stroke (HCC)  Thoracic aortic aneurysm without rupture [facility])    followed by dr Morton Peters ;   ascending -- ct chest 01-24-2023   4.8cm   Wears hearing aid in both ears    wear at times    Past Surgical History:  Procedure Laterality Date   ANTERIOR CERVICAL DECOMP/DISCECTOMY FUSION N/A 04/03/2017   Procedure: Cervical three-four, Cervical four-five Anterior cervical decompression/discectomy/fusion;  Surgeon: Maeola Harman,  MD;  Location: [facility] OR;  Service: Neurosurgery;  Laterality: N/A;   CATARACT EXTRACTION W/ INTRAOCULAR LENS IMPLANT Right 2016   CATARACT EXTRACTION W/PHACO  12/16/2012   Procedure: CATARACT EXTRACTION PHACO AND INTRAOCULAR LENS PLACEMENT (IOC);  Surgeon: Gemma Payor, MD;  Location: [facility] ORS;  Service: Ophthalmology;  Laterality: Left;  CDE:17.31   COLONOSCOPY  10/03/2011   Procedure: COLONOSCOPY;  Surgeon: Dalia Heading;  Location: [facility] ENDO SUITE;  Service: Gastroenterology;  Laterality: N/A;   CYSTOSCOPY W/ RETROGRADES Bilateral 04/12/2023   Procedure: CYSTOSCOPY WITH RETROGRADE PYELOGRAM;  Surgeon: Marcine Matar, MD;  Location: [facility] ORS;  Service: Urology;  Laterality: Bilateral;   CYSTOSCOPY WITH BIOPSY N/A 04/12/2023   Procedure: CYSTOSCOPY WITH BLADDER BIOPSY;  Surgeon: Marcine Matar, MD;  Location: [facility] ORS;  Service: Urology;  Laterality: N/A;  45 MINS   CYSTOSCOPY/RETROGRADE/URETEROSCOPY/STONE EXTRACTION WITH BASKET Right 03/29/2020   Procedure: CYSTOSCOPY/RETROGRADE/URETEROSCOPY/STONE EXTRACTION WITH BASKET;  Surgeon: Marcine Matar, MD;  Location: [facility] ORS;  Service: Urology;  Laterality: Right;  1 HR   DECOMPRESSIOIN ULNAR NERVE AND CUBITAL TUNNEL RELEASE Left 07/14/2009   elbow   EP IMPLANTABLE DEVICE N/A 08/10/2015   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   HOLMIUM LASER APPLICATION Right 03/29/2020   Procedure: HOLMIUM LASER APPLICATION;  Surgeon: Marcine Matar, MD;  Location: [facility] ORS;  Service: Urology;  Laterality: Right;   INGUINAL HERNIA REPAIR Right 1990's   KNEE ARTHROSCOPY Right 2015   POSTERIOR LUMBAR FUSION     2014   L3--4;    12/ 2018   L4--5   SHOULDER ARTHROSCOPY Left 1990's   TEE WITHOUT CARDIOVERSION N/A 07/27/2015   Procedure: TRANSESOPHAGEAL ECHOCARDIOGRAM (TEE);  Surgeon: Lewayne Bunting, MD;  Location: [facility] ENDOSCOPY;  Service: Cardiovascular;  Laterality: N/A;   normal LV function, ef 55-60%,  mild AR and MR, mild dilated ascending aorta  (4.2cm),  mild to moderate atherosclerosis  descending aorta,  mild to moderate TR,  negative saline microcavitation study   THYROIDECTOMY Right 01/20/2015   Procedure: RIGHT THYROIDECTOMY;  Surgeon: Darletta Moll, MD;  Location: [facility] OR;  Service: ENT;  Laterality: Right;   TONSILLECTOMY  as child   TOTAL HIP ARTHROPLASTY Left 12/14/2020   Procedure: TOTAL HIP ARTHROPLASTY ANTERIOR APPROACH;  Surgeon: Sheral Apley, MD;  Location: [facility] ORS;  Service: Orthopedics;  Laterality: Left;   TRANSURETHRAL RESECTION OF BLADDER TUMOR N/A 05/15/2016   Procedure: TRANSURETHRAL RESECTION OF BLADDER TUMOR (TURBT) AND INSTILLATION OF EPIRUBICIN;  Surgeon: Marcine Matar, MD;  Location: [facility];  Service: Urology;  Laterality: N/A;   TRANSURETHRAL RESECTION OF BLADDER TUMOR N/A 03/29/2020   Procedure: TRANSURETHRAL RESECTION OF BLADDER TUMOR (TURBT);  Surgeon: Marcine Matar, MD;  Location: [facility];  Service: Urology;  Laterality: N/A;   TRANSURETHRAL RESECTION OF BLADDER TUMOR WITH MITOMYCIN-C N/A 01/23/2022   Procedure: TRANSURETHRAL RESECTION OF BLADDER TUMOR WITH POST OPERATIVE GEMCITABINE INSTILLATION;  Surgeon: Marcine Matar, MD;  Location: [facility] ORS;  Service: Urology;  Laterality: N/A;    Home Medications:  Allergies as of 08/28/2023  Reactions   Flomax [tamsulosin Hcl] Nausea And Vomiting, Other (See Comments)   "Pt thought he was dying " DIZZY   Fentanyl Other (See Comments)   Makes me combative         Medication List        Accurate as of August 27, 2023  4:19 PM. If you have any questions, ask your nurse or doctor.          acetaminophen 500 MG tablet Commonly known as: TYLENOL Take 500 mg by mouth in the morning and at bedtime.   atorvastatin 20 MG tablet Commonly known as: LIPITOR TAKE 1 TABLET BY MOUTH DAILY.   budesonide-formoterol 160-4.5 MCG/ACT inhaler Commonly known as: Symbicort Inhale 2 puffs into the lungs 2 (two) times daily.    cetirizine 10 MG tablet Commonly known as: ZYRTEC Take 10 mg by mouth daily.   DEEP BLUE RELIEF EX Apply 1 Application topically 3 (three) times daily as needed (pain).   Eliquis 5 MG Tabs tablet Generic drug: apixaban Take 5 mg by mouth 2 (two) times daily.   finasteride 5 MG tablet Commonly known as: PROSCAR Take 5 mg by mouth in the morning.   Magnesium 250 MG Tabs Take 250 mg by mouth in the morning and at bedtime.   OXYGEN Inhale 2.5 L into the lungs at bedtime.   sertraline 50 MG tablet Commonly known as: ZOLOFT Take 50 mg by mouth every morning.   Ventolin HFA 108 (90 Base) MCG/ACT inhaler Generic drug: albuterol Inhale 2 puffs into the lungs Every 4 hours as needed for wheezing or shortness of breath.   verapamil 240 MG CR tablet Commonly known as: CALAN-SR Take 240 mg by mouth in the morning.   Vitamin D 50 MCG (2000 UT) tablet Take 2,000 Units by mouth in the morning.        Allergies:  Allergies  Allergen Reactions   Flomax [Tamsulosin Hcl] Nausea And Vomiting and Other (See Comments)    "Pt thought he was dying " DIZZY   Fentanyl Other (See Comments)    Makes me combative     Family History  Problem Relation Age of Onset   Stroke Sister    Breast cancer Sister    Stroke Brother     Social History:  reports that he quit smoking about 12 years ago. His smoking use included cigarettes. He started smoking about 57 years ago. He has a 45 pack-year smoking history. He has never used smokeless tobacco. He reports that he does not drink alcohol and does not use drugs.  ROS: A complete review of systems was performed.  All systems are negative except for pertinent findings as noted.  Physical Exam:  Vital signs in last 24 hours: There were no vitals taken for this visit. Constitutional:  Alert and oriented, No acute distress Cardiovascular: Regular rate  Respiratory: Normal respiratory effort Psychiatric: Normal mood and affect  I have  reviewed prior pt note  I have reviewed urinalysis results  I have independently reviewed prior imaging  I have reviewed prior pathology results  I have reviewed prior urine culture  Cystoscopy Procedure Note:  Indication: Bladder cancer surveillance  After informed consent and discussion of the procedure and its risks, Kyle Owen was positioned and prepped in the standard fashion.  Cystoscopy was performed with a flexible cystoscope.   Findings: Urethra:*** Prostate:*** Bladder neck:*** Ureteral orifices:*** Bladder:***  The patient tolerated the procedure well.      Impression/Assessment:  ***  Plan:  ***

## 2023-08-28 ENCOUNTER — Other Ambulatory Visit: Payer: Medicare Other | Admitting: Urology

## 2023-08-28 ENCOUNTER — Encounter: Payer: Self-pay | Admitting: Urology

## 2023-08-28 VITALS — BP 100/61 | HR 84

## 2023-08-28 DIAGNOSIS — N200 Calculus of kidney: Secondary | ICD-10-CM

## 2023-08-28 DIAGNOSIS — C679 Malignant neoplasm of bladder, unspecified: Secondary | ICD-10-CM

## 2023-08-28 DIAGNOSIS — D090 Carcinoma in situ of bladder: Secondary | ICD-10-CM

## 2023-08-28 DIAGNOSIS — Z8551 Personal history of malignant neoplasm of bladder: Secondary | ICD-10-CM

## 2023-08-28 LAB — URINALYSIS, ROUTINE W REFLEX MICROSCOPIC
Bilirubin, UA: NEGATIVE
Glucose, UA: NEGATIVE
Ketones, UA: NEGATIVE
Leukocytes,UA: NEGATIVE
Nitrite, UA: NEGATIVE
Protein,UA: NEGATIVE
RBC, UA: NEGATIVE
Specific Gravity, UA: 1.025 (ref 1.005–1.030)
Urobilinogen, Ur: 0.2 mg/dL (ref 0.2–1.0)
pH, UA: 5.5 (ref 5.0–7.5)

## 2023-08-28 MED ORDER — CIPROFLOXACIN HCL 500 MG PO TABS
500.0000 mg | ORAL_TABLET | Freq: Once | ORAL | Status: AC
Start: 2023-08-28 — End: 2023-08-28
  Administered 2023-08-28: 500 mg via ORAL

## 2023-08-30 ENCOUNTER — Other Ambulatory Visit: Payer: Self-pay | Admitting: Pulmonary Disease

## 2023-08-30 LAB — CYTOLOGY, URINE

## 2023-09-04 ENCOUNTER — Telehealth: Payer: Self-pay

## 2023-09-04 ENCOUNTER — Other Ambulatory Visit: Payer: Self-pay | Admitting: Urology

## 2023-09-04 DIAGNOSIS — D090 Carcinoma in situ of bladder: Secondary | ICD-10-CM

## 2023-09-04 NOTE — Telephone Encounter (Signed)
-----   Message from Bertram Millard Dahlstedt sent at 09/04/2023 12:28 PM EDT ----- I called patient's wife with cytology results.  I have recommended CT of abdomen and pelvis.  As well as basic metabolic panel.  Please schedule.  Thank you ----- Message ----- From: Nell Range Lab Results In Sent: 08/28/2023   3:36 PM EDT To: Marcine Matar, MD

## 2023-09-04 NOTE — Telephone Encounter (Signed)
Patient is made aware and voiced understanding. 

## 2023-09-07 ENCOUNTER — Ambulatory Visit (HOSPITAL_COMMUNITY)
Admission: RE | Admit: 2023-09-07 | Discharge: 2023-09-07 | Disposition: A | Discharge status: Home / Self Care | Payer: Medicare Other | Source: Ambulatory Visit | Attending: Urology | Admitting: Urology

## 2023-09-07 DIAGNOSIS — D090 Carcinoma in situ of bladder: Secondary | ICD-10-CM | POA: Insufficient documentation

## 2023-09-07 LAB — POCT I-STAT CREATININE: Creatinine, Ser: 1.9 mg/dL — ABNORMAL HIGH (ref 0.61–1.24)

## 2023-09-07 MED ORDER — IOHEXOL 300 MG/ML  SOLN
100.0000 mL | Freq: Once | INTRAMUSCULAR | Status: AC | PRN
  Administered 2023-09-07: 75 mL via INTRAVENOUS

## 2023-09-08 LAB — BASIC METABOLIC PANEL
BUN/Creatinine Ratio: 22 (ref 10–24)
BUN: 42 mg/dL — ABNORMAL HIGH (ref 8–27)
CO2: 21 mmol/L (ref 20–29)
Calcium: 9 mg/dL (ref 8.6–10.2)
Chloride: 107 mmol/L — ABNORMAL HIGH (ref 96–106)
Creatinine, Ser: 1.87 mg/dL — ABNORMAL HIGH (ref 0.76–1.27)
Glucose: 88 mg/dL (ref 70–99)
Potassium: 4.3 mmol/L (ref 3.5–5.2)
Sodium: 144 mmol/L (ref 134–144)
eGFR: 35 mL/min/{1.73_m2} — ABNORMAL LOW (ref >59)

## 2023-09-25 ENCOUNTER — Telehealth: Payer: Self-pay

## 2023-09-25 NOTE — Telephone Encounter (Signed)
[name] wife is asking if they can be called with CT results or notified of MD's response.  Can you review imaging and advise

## 2023-10-03 ENCOUNTER — Other Ambulatory Visit: Payer: Self-pay | Admitting: Cardiothoracic Surgery

## 2023-10-03 DIAGNOSIS — I7121 Aneurysm of the ascending aorta, without rupture: Secondary | ICD-10-CM

## 2023-10-08 ENCOUNTER — Telehealth: Payer: Self-pay

## 2023-10-08 NOTE — Telephone Encounter (Signed)
Patient wife left a voicemail to inquire about surgery.  Please advise.

## 2023-10-09 NOTE — Telephone Encounter (Signed)
Will you please provide posting sheet if moving forward with surgery.

## 2023-10-18 NOTE — Telephone Encounter (Signed)
Patient wife stopped to schedule her husbands procedure, she said to call her cellphone only do not call the other number her husband is getting dementia and will not remember to give her information.    I told her you would call her Friday 10/25 she will be waiting for your call

## 2023-10-19 ENCOUNTER — Other Ambulatory Visit: Payer: Self-pay

## 2023-10-19 ENCOUNTER — Inpatient Hospital Stay (HOSPITAL_COMMUNITY)
Admission: EM | Admit: 2023-10-19 | Discharge: 2023-10-21 | DRG: 190 | Disposition: A | Discharge status: Home / Self Care | Payer: Medicare Other | Attending: Internal Medicine | Admitting: Internal Medicine

## 2023-10-19 ENCOUNTER — Encounter (HOSPITAL_COMMUNITY): Payer: Self-pay

## 2023-10-19 ENCOUNTER — Ambulatory Visit
Admission: EM | Admit: 2023-10-19 | Discharge: 2023-10-19 | Disposition: A | Discharge status: Other Acute Inpatient Hospital | Payer: Medicare Other

## 2023-10-19 ENCOUNTER — Emergency Department (HOSPITAL_COMMUNITY): Payer: Medicare Other

## 2023-10-19 DIAGNOSIS — M1711 Unilateral primary osteoarthritis, right knee: Secondary | ICD-10-CM | POA: Diagnosis present

## 2023-10-19 DIAGNOSIS — Z96642 Presence of left artificial hip joint: Secondary | ICD-10-CM | POA: Diagnosis present

## 2023-10-19 DIAGNOSIS — R350 Frequency of micturition: Secondary | ICD-10-CM | POA: Diagnosis present

## 2023-10-19 DIAGNOSIS — R0902 Hypoxemia: Secondary | ICD-10-CM | POA: Diagnosis not present

## 2023-10-19 DIAGNOSIS — Z860100 Personal history of colon polyps, unspecified: Secondary | ICD-10-CM

## 2023-10-19 DIAGNOSIS — E890 Postprocedural hypothyroidism: Secondary | ICD-10-CM | POA: Diagnosis present

## 2023-10-19 DIAGNOSIS — H9193 Unspecified hearing loss, bilateral: Secondary | ICD-10-CM | POA: Diagnosis present

## 2023-10-19 DIAGNOSIS — I129 Hypertensive chronic kidney disease with stage 1 through stage 4 chronic kidney disease, or unspecified chronic kidney disease: Secondary | ICD-10-CM | POA: Diagnosis present

## 2023-10-19 DIAGNOSIS — Z79899 Other long term (current) drug therapy: Secondary | ICD-10-CM

## 2023-10-19 DIAGNOSIS — I7121 Aneurysm of the ascending aorta, without rupture: Secondary | ICD-10-CM | POA: Diagnosis present

## 2023-10-19 DIAGNOSIS — Z8673 Personal history of transient ischemic attack (TIA), and cerebral infarction without residual deficits: Secondary | ICD-10-CM

## 2023-10-19 DIAGNOSIS — E785 Hyperlipidemia, unspecified: Secondary | ICD-10-CM | POA: Diagnosis present

## 2023-10-19 DIAGNOSIS — Z888 Allergy status to other drugs, medicaments and biological substances status: Secondary | ICD-10-CM

## 2023-10-19 DIAGNOSIS — Z823 Family history of stroke: Secondary | ICD-10-CM | POA: Diagnosis not present

## 2023-10-19 DIAGNOSIS — I779 Disorder of arteries and arterioles, unspecified: Secondary | ICD-10-CM | POA: Diagnosis not present

## 2023-10-19 DIAGNOSIS — N401 Enlarged prostate with lower urinary tract symptoms: Secondary | ICD-10-CM | POA: Diagnosis present

## 2023-10-19 DIAGNOSIS — J441 Chronic obstructive pulmonary disease with (acute) exacerbation: Principal | ICD-10-CM | POA: Diagnosis present

## 2023-10-19 DIAGNOSIS — Z974 Presence of external hearing-aid: Secondary | ICD-10-CM

## 2023-10-19 DIAGNOSIS — Z7901 Long term (current) use of anticoagulants: Secondary | ICD-10-CM

## 2023-10-19 DIAGNOSIS — R0602 Shortness of breath: Secondary | ICD-10-CM | POA: Diagnosis present

## 2023-10-19 DIAGNOSIS — I712 Thoracic aortic aneurysm, without rupture, unspecified: Secondary | ICD-10-CM | POA: Diagnosis present

## 2023-10-19 DIAGNOSIS — I480 Paroxysmal atrial fibrillation: Secondary | ICD-10-CM | POA: Diagnosis present

## 2023-10-19 DIAGNOSIS — Z961 Presence of intraocular lens: Secondary | ICD-10-CM | POA: Diagnosis present

## 2023-10-19 DIAGNOSIS — Z1152 Encounter for screening for COVID-19: Secondary | ICD-10-CM | POA: Diagnosis not present

## 2023-10-19 DIAGNOSIS — D090 Carcinoma in situ of bladder: Secondary | ICD-10-CM

## 2023-10-19 DIAGNOSIS — N1832 Chronic kidney disease, stage 3b: Secondary | ICD-10-CM | POA: Diagnosis present

## 2023-10-19 DIAGNOSIS — J9611 Chronic respiratory failure with hypoxia: Secondary | ICD-10-CM | POA: Diagnosis present

## 2023-10-19 DIAGNOSIS — C679 Malignant neoplasm of bladder, unspecified: Secondary | ICD-10-CM | POA: Diagnosis present

## 2023-10-19 DIAGNOSIS — Z87891 Personal history of nicotine dependence: Secondary | ICD-10-CM

## 2023-10-19 DIAGNOSIS — Z9981 Dependence on supplemental oxygen: Secondary | ICD-10-CM

## 2023-10-19 DIAGNOSIS — Z87442 Personal history of urinary calculi: Secondary | ICD-10-CM | POA: Diagnosis not present

## 2023-10-19 DIAGNOSIS — J9601 Acute respiratory failure with hypoxia: Secondary | ICD-10-CM

## 2023-10-19 DIAGNOSIS — Z803 Family history of malignant neoplasm of breast: Secondary | ICD-10-CM | POA: Diagnosis not present

## 2023-10-19 DIAGNOSIS — J9621 Acute and chronic respiratory failure with hypoxia: Secondary | ICD-10-CM | POA: Diagnosis present

## 2023-10-19 DIAGNOSIS — Z7951 Long term (current) use of inhaled steroids: Secondary | ICD-10-CM

## 2023-10-19 DIAGNOSIS — J439 Emphysema, unspecified: Secondary | ICD-10-CM

## 2023-10-19 DIAGNOSIS — Z9841 Cataract extraction status, right eye: Secondary | ICD-10-CM

## 2023-10-19 LAB — CBC WITH DIFFERENTIAL/PLATELET
Abs Immature Granulocytes: 0.05 10*3/uL (ref 0.00–0.07)
Basophils Absolute: 0 10*3/uL (ref 0.0–0.1)
Basophils Relative: 0 %
Eosinophils Absolute: 0.1 10*3/uL (ref 0.0–0.5)
Eosinophils Relative: 1 %
HCT: 41.4 % (ref 39.0–52.0)
Hemoglobin: 13.1 g/dL (ref 13.0–17.0)
Immature Granulocytes: 1 %
Lymphocytes Relative: 10 %
Lymphs Abs: 1.1 10*3/uL (ref 0.7–4.0)
MCH: 31.3 pg (ref 26.0–34.0)
MCHC: 31.6 g/dL (ref 30.0–36.0)
MCV: 98.8 fL (ref 80.0–100.0)
Monocytes Absolute: 1.1 10*3/uL — ABNORMAL HIGH (ref 0.1–1.0)
Monocytes Relative: 10 %
Neutro Abs: 8.4 10*3/uL — ABNORMAL HIGH (ref 1.7–7.7)
Neutrophils Relative %: 78 %
Platelets: 152 10*3/uL (ref 150–400)
RBC: 4.19 MIL/uL — ABNORMAL LOW (ref 4.22–5.81)
RDW: 13.8 % (ref 11.5–15.5)
WBC: 10.7 10*3/uL — ABNORMAL HIGH (ref 4.0–10.5)
nRBC: 0 % (ref 0.0–0.2)

## 2023-10-19 LAB — BRAIN NATRIURETIC PEPTIDE: B Natriuretic Peptide: 239 pg/mL — ABNORMAL HIGH (ref 0.0–100.0)

## 2023-10-19 LAB — RESP PANEL BY RT-PCR (RSV, FLU A&B, COVID)  RVPGX2
Influenza A by PCR: NEGATIVE
Influenza B by PCR: NEGATIVE
Resp Syncytial Virus by PCR: NEGATIVE
SARS Coronavirus 2 by RT PCR: NEGATIVE

## 2023-10-19 LAB — BASIC METABOLIC PANEL
Anion gap: 10 (ref 5–15)
BUN: 31 mg/dL — ABNORMAL HIGH (ref 8–23)
CO2: 27 mmol/L (ref 22–32)
Calcium: 8.8 mg/dL — ABNORMAL LOW (ref 8.9–10.3)
Chloride: 102 mmol/L (ref 98–111)
Creatinine, Ser: 1.68 mg/dL — ABNORMAL HIGH (ref 0.61–1.24)
GFR, Estimated: 40 mL/min — ABNORMAL LOW (ref >60)
Glucose, Bld: 118 mg/dL — ABNORMAL HIGH (ref 70–99)
Potassium: 4 mmol/L (ref 3.5–5.1)
Sodium: 139 mmol/L (ref 135–145)

## 2023-10-19 LAB — LACTIC ACID, PLASMA
Lactic Acid, Venous: 1.4 mmol/L (ref 0.5–1.9)
Lactic Acid, Venous: 1.3 mmol/L (ref 0.5–1.9)

## 2023-10-19 MED ORDER — VITAMIN D 25 MCG (1000 UNIT) PO TABS
1000.0000 [IU] | ORAL_TABLET | Freq: Every day | ORAL | Status: DC
  Administered 2023-10-20 – 2023-10-21 (×2): 1000 [IU] via ORAL
  Filled 2023-10-19 (×2): qty 1

## 2023-10-19 MED ORDER — ATORVASTATIN CALCIUM 20 MG PO TABS
20.0000 mg | ORAL_TABLET | Freq: Every day | ORAL | Status: DC
  Administered 2023-10-20 – 2023-10-21 (×2): 20 mg via ORAL
  Filled 2023-10-19 (×2): qty 1

## 2023-10-19 MED ORDER — METHYLPREDNISOLONE SODIUM SUCC 125 MG IJ SOLR
125.0000 mg | Freq: Once | INTRAMUSCULAR | Status: AC
Start: 2023-10-19 — End: 2023-10-19
  Administered 2023-10-19: 125 mg via INTRAVENOUS
  Filled 2023-10-19: qty 2

## 2023-10-19 MED ORDER — APIXABAN 2.5 MG PO TABS
2.5000 mg | ORAL_TABLET | Freq: Two times a day (BID) | ORAL | Status: DC
  Administered 2023-10-19 – 2023-10-21 (×4): 2.5 mg via ORAL
  Filled 2023-10-19 (×4): qty 1

## 2023-10-19 MED ORDER — ACETAMINOPHEN 650 MG RE SUPP: 650.0000 mg | Freq: Four times a day (QID) | RECTAL | Status: DC | PRN

## 2023-10-19 MED ORDER — SERTRALINE HCL 50 MG PO TABS
100.0000 mg | ORAL_TABLET | Freq: Every day | ORAL | Status: DC
  Administered 2023-10-20 – 2023-10-21 (×2): 100 mg via ORAL
  Filled 2023-10-19 (×2): qty 2

## 2023-10-19 MED ORDER — MOMETASONE FURO-FORMOTEROL FUM 200-5 MCG/ACT IN AERO
2.0000 | INHALATION_SPRAY | Freq: Two times a day (BID) | RESPIRATORY_TRACT | Status: DC
  Administered 2023-10-19 – 2023-10-20 (×3): 2 via RESPIRATORY_TRACT
  Filled 2023-10-19: qty 8.8

## 2023-10-19 MED ORDER — ACETAMINOPHEN 325 MG PO TABS
650.0000 mg | ORAL_TABLET | Freq: Four times a day (QID) | ORAL | Status: DC | PRN
  Administered 2023-10-20: 650 mg via ORAL
  Filled 2023-10-19: qty 2

## 2023-10-19 MED ORDER — IPRATROPIUM-ALBUTEROL 0.5-2.5 (3) MG/3ML IN SOLN: 3.0000 mL | Freq: Once | RESPIRATORY_TRACT | Status: DC

## 2023-10-19 MED ORDER — HYDRALAZINE HCL 20 MG/ML IJ SOLN: 5.0000 mg | INTRAMUSCULAR | Status: DC | PRN

## 2023-10-19 MED ORDER — VERAPAMIL HCL ER 240 MG PO TBCR
240.0000 mg | EXTENDED_RELEASE_TABLET | Freq: Every morning | ORAL | Status: DC
  Administered 2023-10-20 – 2023-10-21 (×2): 240 mg via ORAL
  Filled 2023-10-19 (×5): qty 1

## 2023-10-19 MED ORDER — PREDNISONE 20 MG PO TABS
40.0000 mg | ORAL_TABLET | Freq: Every day | ORAL | Status: DC
  Administered 2023-10-21: 40 mg via ORAL
  Filled 2023-10-19: qty 2

## 2023-10-19 MED ORDER — METHYLPREDNISOLONE SODIUM SUCC 125 MG IJ SOLR
80.0000 mg | Freq: Two times a day (BID) | INTRAMUSCULAR | Status: AC
  Administered 2023-10-20 (×2): 80 mg via INTRAVENOUS
  Filled 2023-10-19 (×2): qty 2

## 2023-10-19 MED ORDER — VITAMIN D 50 MCG (2000 UT) PO TABS: 2000.0000 [IU] | ORAL_TABLET | Freq: Every morning | ORAL | Status: DC

## 2023-10-19 MED ORDER — ALBUTEROL SULFATE (2.5 MG/3ML) 0.083% IN NEBU: 2.5000 mg | INHALATION_SOLUTION | RESPIRATORY_TRACT | Status: DC | PRN

## 2023-10-19 MED ORDER — IPRATROPIUM-ALBUTEROL 0.5-2.5 (3) MG/3ML IN SOLN
3.0000 mL | Freq: Four times a day (QID) | RESPIRATORY_TRACT | Status: DC
  Administered 2023-10-19 – 2023-10-20 (×5): 3 mL via RESPIRATORY_TRACT
  Filled 2023-10-19 (×5): qty 3

## 2023-10-19 MED ORDER — DOXYCYCLINE HYCLATE 100 MG PO TABS
100.0000 mg | ORAL_TABLET | Freq: Two times a day (BID) | ORAL | Status: DC
  Administered 2023-10-19 – 2023-10-21 (×4): 100 mg via ORAL
  Filled 2023-10-19 (×4): qty 1

## 2023-10-19 MED ORDER — IPRATROPIUM-ALBUTEROL 0.5-2.5 (3) MG/3ML IN SOLN
3.0000 mL | Freq: Once | RESPIRATORY_TRACT | Status: AC
  Administered 2023-10-19: 3 mL via RESPIRATORY_TRACT

## 2023-10-19 MED ORDER — FINASTERIDE 5 MG PO TABS
5.0000 mg | ORAL_TABLET | Freq: Every morning | ORAL | Status: DC
  Administered 2023-10-20 – 2023-10-21 (×2): 5 mg via ORAL
  Filled 2023-10-19 (×2): qty 1

## 2023-10-19 NOTE — ED Notes (Signed)
Pt lying in bed with wife at bedside. Currently showing no signs of SHOB on 4L of oxygen.

## 2023-10-19 NOTE — ED Notes (Signed)
Report called to [facility] ED.

## 2023-10-19 NOTE — ED Notes (Signed)
Pt lying in bed playing cards with wife at the bedside. Denies any pain or discomfort.

## 2023-10-19 NOTE — Telephone Encounter (Signed)
Surgery scheduled with wife and added to surgery work que

## 2023-10-19 NOTE — H&P (Signed)
TRH H&P   Patient Demographics:    Kyle Owen, is a 82 y.o. male  MRN: [id number]   DOB - 1941-09-24  Admit Date - 10/19/2023  Outpatient Primary MD for the patient is Carylon Perches, MD  Referring MD/NP/PA: Dr. Eloise Harman  Patient coming from: Home  Chief Complaint  Patient presents with   Shortness of Breath      HPI:    Viral Paneto  is a 82 y.o. male, with past medical history of COPD, respiratory failure on oxygen requirement at bedtime, COPD, depression, paroxysmal A-fib on Eliquis, bladder cancer, plan for biopsy next week at [facility]. -Patient presents to ED secondary to complaints of shortness of breath, wife at bedside assist with the history as he is hard of hearing, patient went to urgent care initially with dyspnea, his O2 sat was noted to be 82%, so he was sent to ED for further evaluation, patient reports generalized weakness, fatigue, dyspnea, given 1 L by EMS en route to the hospital, upon presentation he was hypoxic requiring 4 L oxygen, patient reports chills, unsure about fever, denies chest pain, reports cough with phlegm. -In ED Significant for creatinine of 1.68, lactic acid within normal limit, BNP of 239, white blood cell count of 10.7, x-ray significant for chronic finding of scarring, given dyspnea, worsening hypoxia, [facility] hospitalist consulted to admit for COPD exacerbation    Review of systems:    A full 10 point Review of Systems was done, except as stated above, all other Review of Systems were negative.   With Past History of the following :    Past Medical History:  Diagnosis Date   A-fib (HCC)    Anticoagulant long-term use    eliquis--- managed by cardiology   Benign prostatic hypertrophy with urinary frequency    Bladder cancer (HCC)    Chronic respiratory failure with hypoxia (HCC)    Complication of anesthesia    gets combative in  recovery   COPD with chronic bronchitis and emphysema (HCC)    pulmonologist--- dr Vassie Loll;  nocturnal oxygen,  no daytime oxygen use   Dependence on nocturnal oxygen therapy    Depression    Dyspnea    when walking   History of colon polyps    History of kidney stones    History of loop recorder    loop recorder in place battery is dead has not had changed due to covid   History of TIA (transient ischemic attack)    (per pt no residual ) per neurologist note (dr Marjory Lies 11-08-2015) dx cryptogenic TIA versus arrhythia versus dysautonomia (right ophthalmic artery TIA and x3 posterior circulation TIAs   Hyperlipidemia    Hypertension    Pt denies   Malignant neoplasm of overlapping sites of bladder [facility])    urologist--- dr Retta Diones   Multinodular thyroid    per pathology report 11-12-2014 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    Nephrolithiasis    OA (osteoarthritis)    DJD right knee   Ocular migraine    controlled w/ verapamil   On home oxygen therapy    PAF (paroxysmal atrial fibrillation) (HCC)    followed by AFIB clinic-- cardiologist-- dr Anne Fu   Pneumonia    2023   Pulmonary nodule, left    left lower lobe x2 per CT 01-20-2016   Renal cyst, left    Stroke [facility])    Thoracic aortic aneurysm without rupture [facility])    followed by dr Morton Peters ;   ascending -- ct chest 01-24-2023   4.8cm   Wears hearing aid in both ears    wear at times      Past Surgical History:  Procedure Laterality Date   ANTERIOR CERVICAL DECOMP/DISCECTOMY FUSION N/A 04/03/2017   Procedure: Cervical three-four, Cervical four-five Anterior cervical decompression/discectomy/fusion;  Surgeon: Maeola Harman, MD;  Location: [facility] OR;  Service: Neurosurgery;  Laterality: N/A;   CATARACT EXTRACTION W/ INTRAOCULAR LENS IMPLANT Right 2016   CATARACT EXTRACTION W/PHACO  12/16/2012   Procedure: CATARACT EXTRACTION PHACO AND INTRAOCULAR LENS PLACEMENT (IOC);  Surgeon: Gemma Payor, MD;  Location: [facility] ORS;  Service: Ophthalmology;  Laterality: Left;  CDE:17.31   COLONOSCOPY  10/03/2011   Procedure: COLONOSCOPY;  Surgeon: Dalia Heading;  Location: [facility] ENDO SUITE;  Service: Gastroenterology;  Laterality: N/A;   CYSTOSCOPY W/ RETROGRADES Bilateral 04/12/2023   Procedure: CYSTOSCOPY WITH RETROGRADE PYELOGRAM;  Surgeon: Marcine Matar, MD;  Location: [facility] ORS;  Service: Urology;  Laterality: Bilateral;   CYSTOSCOPY WITH BIOPSY N/A 04/12/2023   Procedure: CYSTOSCOPY WITH BLADDER BIOPSY;  Surgeon: Marcine Matar, MD;  Location: [facility] ORS;  Service: Urology;  Laterality: N/A;  45 MINS   CYSTOSCOPY/RETROGRADE/URETEROSCOPY/STONE EXTRACTION WITH BASKET Right 03/29/2020   Procedure: CYSTOSCOPY/RETROGRADE/URETEROSCOPY/STONE EXTRACTION WITH BASKET;  Surgeon: Marcine Matar, MD;  Location: [facility] ORS;  Service: Urology;  Laterality: Right;  1 HR   DECOMPRESSIOIN ULNAR NERVE AND CUBITAL TUNNEL RELEASE Left 07/14/2009   elbow   EP IMPLANTABLE DEVICE N/A 08/10/2015   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   HOLMIUM LASER APPLICATION Right 03/29/2020   Procedure: HOLMIUM LASER APPLICATION;  Surgeon: Marcine Matar, MD;  Location: [facility];  Service: Urology;  Laterality: Right;   INGUINAL HERNIA REPAIR Right 1990's   KNEE ARTHROSCOPY Right 2015   POSTERIOR LUMBAR FUSION     2014   L3--4;    12/ 2018   L4--5   SHOULDER ARTHROSCOPY Left 1990's   TEE WITHOUT CARDIOVERSION N/A 07/27/2015   Procedure: TRANSESOPHAGEAL ECHOCARDIOGRAM (TEE);  Surgeon: Lewayne Bunting, MD;  Location: [facility] ENDOSCOPY;  Service: Cardiovascular;  Laterality: N/A;   normal LV function, ef 55-60%,  mild AR and MR, mild dilated ascending aorta (4.2cm),  mild to moderate atherosclerosis  descending aorta,  mild to moderate TR,  negative saline microcavitation study   THYROIDECTOMY Right 01/20/2015   Procedure: RIGHT THYROIDECTOMY;  Surgeon: Darletta Moll, MD;  Location: [facility] OR;  Service: ENT;  Laterality:  Right;   TONSILLECTOMY  as child   TOTAL HIP ARTHROPLASTY Left 12/14/2020   Procedure: TOTAL HIP ARTHROPLASTY ANTERIOR APPROACH;  Surgeon: Sheral Apley, MD;  Location: [facility];  Service: Orthopedics;  Laterality: Left;   TRANSURETHRAL RESECTION OF BLADDER TUMOR N/A 05/15/2016   Procedure: TRANSURETHRAL RESECTION OF BLADDER TUMOR (TURBT) AND INSTILLATION OF EPIRUBICIN;  Surgeon: Marcine Matar, MD;  Location: [facility];  Service: Urology;  Laterality: N/A;   TRANSURETHRAL RESECTION OF BLADDER TUMOR N/A 03/29/2020   Procedure: TRANSURETHRAL RESECTION OF BLADDER TUMOR (TURBT);  Surgeon: Marcine Matar, MD;  Location: [facility];  Service: Urology;  Laterality: N/A;   TRANSURETHRAL RESECTION OF BLADDER TUMOR WITH MITOMYCIN-C N/A 01/23/2022   Procedure: TRANSURETHRAL RESECTION OF BLADDER TUMOR WITH POST OPERATIVE GEMCITABINE INSTILLATION;  Surgeon: Marcine Matar, MD;  Location: [facility];  Service: Urology;  Laterality: N/A;      Social History:     Social History   Tobacco Use   Smoking status: Former    Current packs/day: 0.00    Average packs/day: 1 pack/day for 45.0 years (45.0 ttl pk-yrs)    Types: Cigarettes    Start date: 09/26/1965    Quit date: 09/26/2010    Years since quitting: 13.0   Smokeless tobacco: Never  Substance Use Topics   Alcohol use: No       Family History :     Family History  Problem Relation Age of Onset   Stroke Sister    Breast cancer Sister    Stroke Brother      Home Medications:   Prior to Admission medications   Medication Sig Start Date End Date Taking? Authorizing Provider  acetaminophen (TYLENOL) 500 MG tablet Take 500 mg by mouth in the morning and at bedtime.   Yes [provider]  atorvastatin (LIPITOR) 20 MG tablet TAKE 1 TABLET BY MOUTH DAILY. 05/09/18  Yes Jake Bathe, MD  budesonide-formoterol (SYMBICORT) 160-4.5 MCG/ACT inhaler INHALE 2 PUFFS INTO THE LUNGS TWICE DAILY. 08/31/23  Yes Oretha Milch, MD  cetirizine (ZYRTEC) 10 MG tablet Take 10 mg by mouth daily.   Yes [provider]  Cholecalciferol (VITAMIN D) 50 MCG (2000 UT) tablet Take 2,000 Units by mouth in the morning.   Yes [provider]  ELIQUIS 5 MG TABS tablet Take 5 mg by mouth 2 (two) times daily. 02/10/22  Yes [provider]  finasteride (PROSCAR) 5 MG tablet Take 5 mg by mouth in the morning.   Yes [provider]  Magnesium 250 MG TABS Take 250 mg by mouth in the morning and at bedtime.   Yes [provider]  OXYGEN Inhale 2.5 L into the lungs at bedtime.   Yes [provider]  sertraline (ZOLOFT) 50 MG tablet Take 50 mg by mouth every morning.   Yes [provider]  VENTOLIN HFA 108 (90 BASE) MCG/ACT inhaler Inhale 2 puffs into the lungs Every 4 hours as needed for wheezing or shortness of breath.  09/24/12  Yes [provider]  verapamil (CALAN-SR) 240 MG CR tablet Take 240 mg by mouth in the morning.   Yes [provider]     Allergies:     Allergies  Allergen Reactions   Flomax [Tamsulosin Hcl] Nausea And Vomiting and Other (See Comments)    "Pt thought he was dying " DIZZY   Fentanyl Other (See Comments)    Makes me combative  Pt states he's had it since then and was fine 10-19-23     Physical Exam:   Vitals  Blood pressure 121/61, pulse 69, temperature 99.3 F (37.4 C), resp. rate (!) 24, height 6\' 1"  (1.854 m), weight 88 kg, SpO2 96%.   1. General Well, deconditioned, chronically appearing male, laying in bed in mild discomfort  2.  Extremely hard of hearing, pleasant, appropriate  3. No F.N deficits, ALL C.Nerves Intact, Strength 5/5 all  4 extremities, Sensation intact all 4 extremities, Plantars down going.  4. Ears and Eyes appear Normal, Conjunctivae clear, PERRLA. Moist Oral Mucosa.  5. Supple Neck, No JVD, No cervical lymphadenopathy appriciated, No Carotid Bruits.  6. Symmetrical Chest wall movement,  patient with scattered wheezing bilaterally  7. RRR, No Gallops, Rubs or Murmurs, No Parasternal Heave.  8. Positive Bowel Sounds, Abdomen Soft, No tenderness, No organomegaly appriciated,No rebound -guarding or rigidity.  9.  No Cyanosis, Normal Skin Turgor, No Skin Rash or Bruise.  10. Good muscle tone,  joints appear normal , no effusions, Normal ROM.    Data Review:    CBC Recent Labs  Lab 10/19/23 1422  WBC 10.7*  HGB 13.1  HCT 41.4  PLT 152  MCV 98.8  MCH 31.3  MCHC 31.6  RDW 13.8  LYMPHSABS 1.1  MONOABS 1.1*  EOSABS 0.1  BASOSABS 0.0   ------------------------------------------------------------------------------------------------------------------  Chemistries  Recent Labs  Lab 10/19/23 1422  NA 139  K 4.0  CL 102  CO2 27  GLUCOSE 118*  BUN 31*  CREATININE 1.68*  CALCIUM 8.8*   ------------------------------------------------------------------------------------------------------------------ estimated creatinine clearance is 37.7 mL/min (A) (by C-G formula based on SCr of 1.68 mg/dL (H)). ------------------------------------------------------------------------------------------------------------------ No results for input(s): "TSH", "T4TOTAL", "T3FREE", "THYROIDAB" in the last 72 hours.  Invalid input(s): "FREET3"  Coagulation profile No results for input(s): "INR", "PROTIME" in the last 168 hours. ------------------------------------------------------------------------------------------------------------------- No results for input(s): "DDIMER" in the last 72 hours. -------------------------------------------------------------------------------------------------------------------  Cardiac Enzymes No results for input(s): "CKMB", "TROPONINI", "MYOGLOBIN" in the last 168 hours.  Invalid input(s): "CK" ------------------------------------------------------------------------------------------------------------------    Component Value Date/Time    BNP 239.0 (H) 10/19/2023 1422     ---------------------------------------------------------------------------------------------------------------  Urinalysis    Component Value Date/Time   COLORURINE YELLOW 06/13/2016 1120   APPEARANCEUR Clear 08/28/2023 1040   LABSPEC 1.020 06/13/2016 1120   PHURINE 5.5 06/13/2016 1120   GLUCOSEU Negative 08/28/2023 1040   HGBUR NEGATIVE 06/13/2016 1120   BILIRUBINUR Negative 08/28/2023 1040   KETONESUR NEGATIVE 06/13/2016 1120   PROTEINUR Negative 08/28/2023 1040   PROTEINUR NEGATIVE 06/13/2016 1120   UROBILINOGEN negative (A) 05/04/2020 0930   NITRITE Negative 08/28/2023 1040   NITRITE NEGATIVE 06/13/2016 1120   LEUKOCYTESUR Negative 08/28/2023 1040    ----------------------------------------------------------------------------------------------------------------   Imaging Results:    DG Chest Port 1 View  Result Date: 10/19/2023 CLINICAL DATA:  Shortness of breath.  Fever. EXAM: PORTABLE CHEST 1 VIEW COMPARISON:  September 08, 2022.  January 24, 2023. FINDINGS: Stable cardiomediastinal silhouette. Stable bibasilar interstitial densities are noted most consistent with scarring or possibly atelectasis, left greater than right. No acute abnormality seen involving the bony thorax. IMPRESSION: Stable bibasilar interstitial densities are noted most consistent with scarring or possibly atelectasis, left greater than right. Electronically Signed   By: Lupita Raider M.D.   On: 10/19/2023 15:35     EKG:   Vent. rate 70 BPM PR interval * ms QRS duration 103 ms QT/QTcB 367/396 ms P-R-T axes * 88 67 Atrial flutter/fibrillation Borderline right axis deviation   Assessment & Plan:    Principal Problem:   COPD exacerbation (HCC) Active Problems:   Paroxysmal atrial fibrillation (HCC)   Thoracic ascending aortic aneurysm (HCC)   Chronic anticoagulation   History of stroke   Bladder cancer (HCC)   Aortic disease (HCC)   Chronic respiratory  failure with hypoxia (HCC)    COPD exacerbation -presents with dyspnea, productive cough -admitted under COPD pathway, will start on IV steroids, scheduled  DuoNebs, as needed albuterol, given productive cough will start on p.o.Doxycycline as well, he was encouraged to use incentive spirometry and flutter valve -New with home Symbicort( formulary is  Dulera )  Paroxysmal A-fib -Continue with Eliquis for anticoagulation -Continue with verapamil for heart rate control  CVA -He is on Eliquis for anticoagulation , Continue with statin  Bladder cancer -Patient with questionable bladder cancer, plan for bladder biopsy at this [facility] next week.  The patient  History of thoracic aortic aneurysm -This is followed by Dr. Maren Beach as an outpatient  CKd stage IIIb -Renal unction at baseline, continue to monitor closely  Hyperlipidemia -Continue with home dose Lipitor  BPH -continue with Proscar  DVT Prophylaxis on Eliquis  AM Labs Ordered, also please review Full Orders  Family Communication: Admission, patients condition and plan of care including tests being ordered have been discussed with the patient and wife at bedside who indicate understanding and agree with the plan and Code Status.  Code Status full code  Likely DC to home  Consults called: None  Admission status: Inpatient  Time spent in minutes : 70 minutes   Huey Bienenstock M.D on 10/19/2023 at 7:13 PM

## 2023-10-19 NOTE — ED Provider Notes (Signed)
[facility]    CSN: [id number] Arrival date & time: 10/19/23  1252      History   Chief Complaint No chief complaint on file.   HPI Kyle Owen is a 82 y.o. male.   Patient presents to urgent care with his wife who contributes to the history for evaluation of sore throat, chills, and nasal congestion that started 3 days ago with associated cough that started 1 day ago.  Cough is sometimes wet with yellow sputum but mostly dry.  He has had significant chills at home without documented fever.  Reports intermittent dizziness and headaches as well.  He does not feel short of breath currently, however oxygen saturation on room air is 82 to 86%.  History of COPD, former smoker for "50 years".  He quit smoking 10 years ago.  Reports shortness of breath with exertion and walking short distances without chest pain, heart palpitations, leg swelling, orthopnea.  History of chronic respiratory failure with hypoxia, wears oxygen at nighttime but not during the day.  Has not attempted use of any over-the-counter medications to help with symptoms PTA. Oxygen baseline per chart review around 93% on room air.      Past Medical History:  Diagnosis Date   A-fib (HCC)    Anticoagulant long-term use    eliquis--- managed by cardiology   Benign prostatic hypertrophy with urinary frequency    Bladder cancer (HCC)    Chronic respiratory failure with hypoxia (HCC)    Complication of anesthesia    gets combative in recovery   COPD with chronic bronchitis and emphysema (HCC)    pulmonologist--- dr Vassie Loll;  nocturnal oxygen,  no daytime oxygen use   Dependence on nocturnal oxygen therapy    Depression    Dyspnea    when walking   History of colon polyps    History of kidney stones    History of loop recorder    loop recorder in place battery is dead has not had changed due to covid   History of TIA (transient ischemic attack)    (per pt no residual ) per neurologist note (dr Marjory Lies  11-08-2015) dx cryptogenic TIA versus arrhythia versus dysautonomia (right ophthalmic artery TIA and x3 posterior circulation TIAs   Hyperlipidemia    Hypertension    Pt denies   Malignant neoplasm of overlapping sites of bladder [facility])    urologist--- dr Retta Diones   Multinodular thyroid    per pathology report 11-12-2014 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    Nephrolithiasis    OA (osteoarthritis)    DJD right knee   Ocular migraine    controlled w/ verapamil   On home oxygen therapy    PAF (paroxysmal atrial fibrillation) (HCC)    followed by AFIB clinic-- cardiologist-- dr Anne Fu   Pneumonia    2023   Pulmonary nodule, left    left lower lobe x2 per CT 01-20-2016   Renal cyst, left    Stroke [facility])    Thoracic aortic aneurysm without rupture (HCC)    followed by dr Zenaida Niece tright ;   ascending -- ct chest 01-24-2023   4.8cm   Wears hearing aid in both ears    wear at times    Patient Active Problem List   Diagnosis Date Noted   Chronic respiratory failure with hypoxia (HCC) 12/05/2022   Aortic disease (HCC) 05/15/2022   Chronic anticoagulation 10/06/2021   History of stroke 10/06/2021   Bladder cancer (HCC)  10/06/2021   Morning headache 04/04/2021   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   COPD with chronic bronchitis and emphysema (HCC) 09/22/2020   Penile pain 04/07/2020   Balanitis 04/07/2020   Cervical myelopathy (HCC) 04/03/2017   Thoracic ascending aortic aneurysm (HCC) 05/09/2016   Paroxysmal atrial fibrillation (HCC) 12/17/2015   S/P partial thyroidectomy 01/20/2015    Past Surgical History:  Procedure Laterality Date   ANTERIOR CERVICAL DECOMP/DISCECTOMY FUSION N/A 04/03/2017   Procedure: Cervical three-four, Cervical four-five Anterior cervical decompression/discectomy/fusion;  Surgeon: Maeola Harman, MD;  Location: [facility] OR;  Service: Neurosurgery;  Laterality:  N/A;   CATARACT EXTRACTION W/ INTRAOCULAR LENS IMPLANT Right 2016   CATARACT EXTRACTION W/PHACO  12/16/2012   Procedure: CATARACT EXTRACTION PHACO AND INTRAOCULAR LENS PLACEMENT (IOC);  Surgeon: Gemma Payor, MD;  Location: [facility]S;  Service: Ophthalmology;  Laterality: Left;  CDE:17.31   COLONOSCOPY  10/03/2011   Procedure: COLONOSCOPY;  Surgeon: Dalia Heading;  Location: [facility];  Service: Gastroenterology;  Laterality: N/A;   CYSTOSCOPY W/ RETROGRADES Bilateral 04/12/2023   Procedure: CYSTOSCOPY WITH RETROGRADE PYELOGRAM;  Surgeon: Marcine Matar, MD;  Location: [facility];  Service: Urology;  Laterality: Bilateral;   CYSTOSCOPY WITH BIOPSY N/A 04/12/2023   Procedure: CYSTOSCOPY WITH BLADDER BIOPSY;  Surgeon: Marcine Matar, MD;  Location: [facility];  Service: Urology;  Laterality: N/A;  45 MINS   CYSTOSCOPY/RETROGRADE/URETEROSCOPY/STONE EXTRACTION WITH BASKET Right 03/29/2020   Procedure: CYSTOSCOPY/RETROGRADE/URETEROSCOPY/STONE EXTRACTION WITH BASKET;  Surgeon: Marcine Matar, MD;  Location: [facility];  Service: Urology;  Laterality: Right;  1 HR   DECOMPRESSIOIN ULNAR NERVE AND CUBITAL TUNNEL RELEASE Left 07/14/2009   elbow   EP IMPLANTABLE DEVICE N/A 08/10/2015   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   HOLMIUM LASER APPLICATION Right 03/29/2020   Procedure: HOLMIUM LASER APPLICATION;  Surgeon: Marcine Matar, MD;  Location: [facility];  Service: Urology;  Laterality: Right;   INGUINAL HERNIA REPAIR Right 1990's   KNEE ARTHROSCOPY Right 2015   POSTERIOR LUMBAR FUSION     2014   L3--4;    12/ 2018   L4--5   SHOULDER ARTHROSCOPY Left 1990's   TEE WITHOUT CARDIOVERSION N/A 07/27/2015   Procedure: TRANSESOPHAGEAL ECHOCARDIOGRAM (TEE);  Surgeon: Lewayne Bunting, MD;  Location: [facility] ENDOSCOPY;  Service: Cardiovascular;  Laterality: N/A;   normal LV function, ef 55-60%,  mild AR and MR, mild dilated ascending aorta (4.2cm),  mild to moderate atherosclerosis  descending  aorta,  mild to moderate TR,  negative saline microcavitation study   THYROIDECTOMY Right 01/20/2015   Procedure: RIGHT THYROIDECTOMY;  Surgeon: Darletta Moll, MD;  Location: [facility] OR;  Service: ENT;  Laterality: Right;   TONSILLECTOMY  as child   TOTAL HIP ARTHROPLASTY Left 12/14/2020   Procedure: TOTAL HIP ARTHROPLASTY ANTERIOR APPROACH;  Surgeon: Sheral Apley, MD;  Location: [facility];  Service: Orthopedics;  Laterality: Left;   TRANSURETHRAL RESECTION OF BLADDER TUMOR N/A 05/15/2016   Procedure: TRANSURETHRAL RESECTION OF BLADDER TUMOR (TURBT) AND INSTILLATION OF EPIRUBICIN;  Surgeon: Marcine Matar, MD;  Location: [facility];  Service: Urology;  Laterality: N/A;   TRANSURETHRAL RESECTION OF BLADDER TUMOR N/A 03/29/2020   Procedure: TRANSURETHRAL RESECTION OF BLADDER TUMOR (TURBT);  Surgeon: Marcine Matar, MD;  Location: [facility];  Service: Urology;  Laterality: N/A;   TRANSURETHRAL RESECTION OF BLADDER TUMOR WITH MITOMYCIN-C N/A 01/23/2022   Procedure: TRANSURETHRAL RESECTION OF BLADDER TUMOR WITH POST OPERATIVE GEMCITABINE INSTILLATION;  Surgeon: Marcine Matar, MD;  Location: [facility];  Service: Urology;  Laterality: N/A;       Home Medications    Prior to Admission medications   Medication Sig Start Date End Date Taking? Authorizing Provider  acetaminophen (TYLENOL) 500 MG tablet Take 500 mg by mouth in the morning and at bedtime.    [provider]  atorvastatin (LIPITOR) 20 MG tablet TAKE 1 TABLET BY MOUTH DAILY. 05/09/18   Jake Bathe, MD  budesonide-formoterol (SYMBICORT) 160-4.5 MCG/ACT inhaler INHALE 2 PUFFS INTO THE LUNGS TWICE DAILY. 08/31/23   Oretha Milch, MD  cetirizine (ZYRTEC) 10 MG tablet Take 10 mg by mouth daily.    [provider]  Cholecalciferol (VITAMIN D) 50 MCG (2000 UT) tablet Take 2,000 Units by mouth in the morning.    [provider]  ELIQUIS 5 MG TABS tablet Take 5 mg by mouth 2 (two) times daily. 02/10/22    [provider]  finasteride (PROSCAR) 5 MG tablet Take 5 mg by mouth in the morning.    [provider]  Liniments (DEEP BLUE RELIEF EX) Apply 1 Application topically 3 (three) times daily as needed (pain).    [provider]  Magnesium 250 MG TABS Take 250 mg by mouth in the morning and at bedtime.    [provider]  OXYGEN Inhale 2.5 L into the lungs at bedtime.    [provider]  sertraline (ZOLOFT) 50 MG tablet Take 50 mg by mouth every morning.    [provider]  VENTOLIN HFA 108 (90 BASE) MCG/ACT inhaler Inhale 2 puffs into the lungs Every 4 hours as needed for wheezing or shortness of breath.  09/24/12   [provider]  verapamil (CALAN-SR) 240 MG CR tablet Take 240 mg by mouth in the morning.    [provider]    Family History Family History  Problem Relation Age of Onset   Stroke Sister    Breast cancer Sister    Stroke Brother     Social History Social History   Tobacco Use   Smoking status: Former    Current packs/day: 0.00    Average packs/day: 1 pack/day for 45.0 years (45.0 ttl pk-yrs)    Types: Cigarettes    Start date: 09/26/1965    Quit date: 09/26/2010    Years since quitting: 13.0   Smokeless tobacco: Never  Vaping Use   Vaping status: Never Used  Substance Use Topics   Alcohol use: No   Drug use: Never     Allergies   Flomax [tamsulosin hcl] and Fentanyl   Review of Systems Review of Systems Per HPI  Physical Exam Triage Vital Signs ED Triage Vitals  Encounter Vitals Group     BP 10/19/23 1308 114/66     Systolic BP Percentile --      Diastolic BP Percentile --      Pulse Rate 10/19/23 1308 84     Resp 10/19/23 1308 (!) 22     Temp 10/19/23 1308 98.1 F (36.7 C)     Temp Source 10/19/23 1308 Oral     SpO2 10/19/23 1308 (!) 82 %     Weight --      Height --      Head Circumference --      Peak Flow --      Pain Score 10/19/23 1305 0     Pain Loc --      Pain  Education --  Exclude from Growth Chart --    No data found.  Updated Vital Signs BP 114/66 (BP Location: Right Arm)   Pulse 84   Temp 98.1 F (36.7 C) (Oral)   Resp (!) 22   SpO2 99%   Visual Acuity Right Eye Distance:   Left Eye Distance:   Bilateral Distance:    Right Eye Near:   Left Eye Near:    Bilateral Near:     Physical Exam Vitals and nursing note reviewed.  Constitutional:      Appearance: He is not ill-appearing or toxic-appearing.  HENT:     Head: Normocephalic and atraumatic.     Right Ear: Hearing, tympanic membrane, ear canal and external ear normal.     Left Ear: Hearing, tympanic membrane, ear canal and external ear normal.     Nose: Nose normal.     Mouth/Throat:     Lips: Pink.     Mouth: Mucous membranes are moist. No injury.     Tongue: No lesions. Tongue does not deviate from midline.     Palate: No mass and lesions.     Pharynx: Oropharynx is clear. Uvula midline. No pharyngeal swelling, oropharyngeal exudate, posterior oropharyngeal erythema or uvula swelling.     Tonsils: No tonsillar exudate or tonsillar abscesses.  Eyes:     General: Lids are normal. Vision grossly intact. Gaze aligned appropriately.     Extraocular Movements: Extraocular movements intact.     Conjunctiva/sclera: Conjunctivae normal.  Cardiovascular:     Rate and Rhythm: Normal rate and regular rhythm.     Heart sounds: Normal heart sounds, S1 normal and S2 normal.  Pulmonary:     Effort: Pulmonary effort is normal. No tachypnea or respiratory distress.     Breath sounds: Normal air entry. No stridor. Examination of the right-lower field reveals rhonchi and rales. Examination of the left-lower field reveals rhonchi and rales. Rhonchi and rales present. No decreased breath sounds or wheezing.     Comments: Speaking in full sentences without difficulty.  Chest:     Chest wall: No tenderness.  Musculoskeletal:     Cervical back: Neck supple.  Skin:    General: Skin is  warm and dry.     Capillary Refill: Capillary refill takes less than 2 seconds.     Findings: No rash.  Neurological:     General: No focal deficit present.     Mental Status: He is alert and oriented to person, place, and time. Mental status is at baseline.     Cranial Nerves: No dysarthria or facial asymmetry.  Psychiatric:        Mood and Affect: Mood normal.        Speech: Speech normal.        Behavior: Behavior normal.        Thought Content: Thought content normal.        Judgment: Judgment normal.      UC Treatments / Results  Labs (all labs ordered are listed, but only abnormal results are displayed) Labs Reviewed - No data to display  EKG   Radiology No results found.  Procedures Procedures (including critical care time)  Medications Ordered in UC Medications - No data to display  Initial Impression / Assessment and Plan / UC Course  I have reviewed the triage vital signs and the nursing notes.  Pertinent labs & imaging results that were available during my care of the patient were reviewed by me and considered in my medical  decision making (see chart for details).   1.  Shortness of breath on exertion, hypoxia Presentation suspicious for acute cardiopulmonary abnormality given worsening shortness of breath on exertion and hypoxia.  Oxygen saturation 86% at best on her monitor at urgent care.  Shortness of breath improved with rest, however remains hypoxic between 82 to 86%. Lung sound suspicious for either pleural effusion bilaterally or focal consolidation/pneumonia.  Currently afebrile. 2 L of oxygen nasal cannula placed by nursing staff. IV placed to right arm by nursing staff. Recommend further workup and evaluation in the emergency department as patient would likely require hospital admission should he have pneumonia on chest x-ray due to comorbidities.  Discussed recommendations and plan with patient as well as his wife who both expressed understanding  and agreement with plan. [facility] EMS called to transport patient to the nearest emergency department.  Final Clinical Impressions(s) / UC Diagnoses   Final diagnoses:  Shortness of breath on exertion  Hypoxia   Discharge Instructions   None    ED Prescriptions   None    PDMP not reviewed this encounter.   Carlisle Beers, [place] 10/19/23 1337

## 2023-10-19 NOTE — ED Provider Notes (Signed)
  Physical Exam  BP 121/61   Pulse 69   Temp 99.3 F (37.4 C)   Resp (!) 24   Ht 6\' 1"  (1.854 m)   Wt 88 kg   SpO2 96%   BMI 25.60 kg/m   Physical Exam  Procedures  Procedures  ED Course / MDM   Clinical Course as of 10/19/23 1626  Fri Oct 19, 2023  1552 Assumed care from Dr Particia Nearing. 82 yo M with hx of COPD on home 2L PRN. Sats were in 80s so will need to come in for hypoxia and copd exacerbation. Awaiting covid and flu then will admit.  [RP]    Clinical Course User Index [RP] Rondel Baton, MD   Medical Decision Making Amount and/or Complexity of Data Reviewed Labs: ordered. Radiology: ordered.  Risk Prescription drug management. Decision regarding hospitalization.   Pt reassessed. Still has increased O2 requirement. Will give additional dose of duoneb and admit. COVID/Flu negative.        Rondel Baton, MD 10/20/23 1021

## 2023-10-19 NOTE — ED Notes (Signed)
EMS at bedside and left UC.

## 2023-10-19 NOTE — ED Provider Notes (Signed)
[place] EMERGENCY DEPARTMENT AT [facility] Provider Note   CSN: [id number] Arrival date & time: 10/19/23  1355     History  Chief Complaint  Patient presents with   Shortness of Breath    Kyle Owen is a 82 y.o. male.  Pt is a 82 yo male with pmhx significant for bladder cancer, paroxysmal afib (on Eliquis), depression, copd (home O2 at night prn), and cva.  Pt said he's been sob for the past 2-3 weeks.  It is getting worse.  Pt initially went to UC and O2 sat was 82% when he arrived there.  He felt like he was going to pass out walking from the car into the building.  They called EMS who brought him here.  He was given 1 neb en route.  He is on 4L oxygen to keep O2 sat above 90%.  Pt denies fevers.         Home Medications Prior to Admission medications   Medication Sig Start Date End Date Taking? Authorizing Provider  acetaminophen (TYLENOL) 500 MG tablet Take 500 mg by mouth in the morning and at bedtime.    [provider]  atorvastatin (LIPITOR) 20 MG tablet TAKE 1 TABLET BY MOUTH DAILY. 05/09/18   Jake Bathe, MD  budesonide-formoterol (SYMBICORT) 160-4.5 MCG/ACT inhaler INHALE 2 PUFFS INTO THE LUNGS TWICE DAILY. 08/31/23   Oretha Milch, MD  cetirizine (ZYRTEC) 10 MG tablet Take 10 mg by mouth daily.    [provider]  Cholecalciferol (VITAMIN D) 50 MCG (2000 UT) tablet Take 2,000 Units by mouth in the morning.    [provider]  ELIQUIS 5 MG TABS tablet Take 5 mg by mouth 2 (two) times daily. 02/10/22   [provider]  finasteride (PROSCAR) 5 MG tablet Take 5 mg by mouth in the morning.    [provider]  Liniments (DEEP BLUE RELIEF EX) Apply 1 Application topically 3 (three) times daily as needed (pain).    [provider]  Magnesium 250 MG TABS Take 250 mg by mouth in the morning and at bedtime.    [provider]  OXYGEN Inhale 2.5 L into the lungs at bedtime.    [provider]  sertraline (ZOLOFT) 50 MG tablet Take 50 mg by mouth every morning.    [provider]  VENTOLIN HFA 108 (90 BASE) MCG/ACT inhaler Inhale 2 puffs into the lungs Every 4 hours as needed for wheezing or shortness of breath.  09/24/12   [provider]  verapamil (CALAN-SR) 240 MG CR tablet Take 240 mg by mouth in the morning.    [provider]      Allergies    Flomax [tamsulosin hcl] and Fentanyl    Review of Systems   Review of Systems  Respiratory:  Positive for cough, shortness of breath and wheezing.   All other systems reviewed and are negative.   Physical Exam Updated Vital Signs BP (!) 126/57   Pulse 70   Temp 99.3 F (37.4 C)   Resp 20   Ht 6\' 1"  (1.854 m)   Wt 88 kg   SpO2 92%   BMI 25.60 kg/m  Physical Exam Vitals and nursing note reviewed.  Constitutional:      Appearance: He is well-developed.  HENT:     Head: Normocephalic and atraumatic.     Mouth/Throat:     Mouth: Mucous membranes are moist.     Pharynx: Oropharynx is  clear.  Eyes:     Extraocular Movements: Extraocular movements intact.     Pupils: Pupils are equal, round, and reactive to light.  Cardiovascular:     Rate and Rhythm: Normal rate. Rhythm irregular.  Pulmonary:     Breath sounds: Wheezing present.  Abdominal:     General: Bowel sounds are normal.     Palpations: Abdomen is soft.  Musculoskeletal:        General: Normal range of motion.     Cervical back: Normal range of motion and neck supple.  Skin:    General: Skin is warm.     Capillary Refill: Capillary refill takes less than 2 seconds.  Neurological:     General: No focal deficit present.     Mental Status: He is alert and oriented to person, place, and time.  Psychiatric:        Mood and Affect: Mood normal.     ED Results / Procedures / Treatments   Labs (all labs ordered are listed, but only abnormal results are displayed) Labs Reviewed  BASIC METABOLIC PANEL - Abnormal; Notable for  the following components:      Result Value   Glucose, Bld 118 (*)    BUN 31 (*)    Creatinine, Ser 1.68 (*)    Calcium 8.8 (*)    GFR, Estimated 40 (*)    All other components within normal limits  CBC WITH DIFFERENTIAL/PLATELET - Abnormal; Notable for the following components:   WBC 10.7 (*)    RBC 4.19 (*)    Neutro Abs 8.4 (*)    Monocytes Absolute 1.1 (*)    All other components within normal limits  RESP PANEL BY RT-PCR (RSV, FLU A&B, COVID)  [id number]  CULTURE, BLOOD (ROUTINE X 2)  CULTURE, BLOOD (ROUTINE X 2)  LACTIC ACID, PLASMA  BRAIN NATRIURETIC PEPTIDE  LACTIC ACID, PLASMA    EKG EKG Interpretation Date/Time:  Friday October 19 2023 14:41:30 EDT Ventricular Rate:  70 PR Interval:    QRS Duration:  103 QT Interval:  367 QTC Calculation: 396 R Axis:   88  Text Interpretation: Atrial flutter/fibrillation Borderline right axis deviation hx paroxysmal afib Confirmed by Jacalyn Lefevre [phone]) on 10/19/2023 4:02:04 PM  Radiology DG Chest Port 1 View  Result Date: 10/19/2023 CLINICAL DATA:  Shortness of breath.  Fever. EXAM: PORTABLE CHEST 1 VIEW COMPARISON:  September 08, 2022.  January 24, 2023. FINDINGS: Stable cardiomediastinal silhouette. Stable bibasilar interstitial densities are noted most consistent with scarring or possibly atelectasis, left greater than right. No acute abnormality seen involving the bony thorax. IMPRESSION: Stable bibasilar interstitial densities are noted most consistent with scarring or possibly atelectasis, left greater than right. Electronically Signed   By: Lupita Raider M.D.   On: 10/19/2023 15:35    Procedures Procedures    Medications Ordered in ED Medications  methylPREDNISolone sodium succinate (SOLU-MEDROL) 125 mg/2 mL injection 125 mg (125 mg Intravenous Given 10/19/23 1450)  ipratropium-albuterol (DUONEB) 0.5-2.5 (3) MG/3ML nebulizer solution 3 mL (3 mLs Nebulization Given 10/19/23 1504)    ED Course/ Medical Decision  Making/ A&P Clinical Course as of 10/19/23 1605  Fri Oct 19, 2023  1552 Assumed care from Dr Particia Nearing. 82 yo M with hx of COPD on home 2L PRN. Sats were in 80s so will need to come in for hypoxia and copd exacerbation. Awaiting covid and flu then will admit.  [RP]    Clinical Course User Index [RP] Rondel Baton, MD  Medical Decision Making Amount and/or Complexity of Data Reviewed Labs: ordered. Radiology: ordered.  Risk Prescription drug management.   This patient presents to the ED for concern of sob, this involves an extensive number of treatment options, and is a complaint that carries with it a high risk of complications and morbidity.  The differential diagnosis includes copd exac, covid/flu/rsv, pna   Co morbidities that complicate the patient evaluation  bladder cancer, paroxysmal afib (on Eliquis), depression, copd (home O2 at night prn), and cva   Additional history obtained:  Additional history obtained from epic chart review External records from outside source obtained and reviewed including EMS report   Lab Tests:  I Ordered, and personally interpreted labs.  The pertinent results include:  cbc nl, bmp nl other than cr 1.68 (stable), lactic 1.4   Imaging Studies ordered:  I ordered imaging studies including cxr  I independently visualized and interpreted imaging which showed  Stable bibasilar interstitial densities are noted most consistent  with scarring or possibly atelectasis, left greater than right.   I agree with the radiologist interpretation   Cardiac Monitoring:  The patient was maintained on a cardiac monitor.  I personally viewed and interpreted the cardiac monitored which showed an underlying rhythm of: afib   Medicines ordered and prescription drug management:  I ordered medication including nebs/solumedrol  for sx  Reevaluation of the patient after these medicines showed that the patient  improved I have reviewed the patients home medicines and have made adjustments as needed  Critical Interventions:  oxygen   Problem List / ED Course:  COPD exac:  improved with nebs/solumedrol, but still sob.  Pt requiring 4L oxygen.   Reevaluation:  After the interventions noted above, I reevaluated the patient and found that they have :improved   Social Determinants of Health:  Lives at home   Dispostion:  Pending at shift change  CRITICAL CARE Performed by: Jacalyn Lefevre   Total critical care time: 30 minutes  Critical care time was exclusive of separately billable procedures and treating other patients.  Critical care was necessary to treat or prevent imminent or life-threatening deterioration.  Critical care was time spent personally by me on the following activities: development of treatment plan with patient and/or surrogate as well as nursing, discussions with consultants, evaluation of patient's response to treatment, examination of patient, obtaining history from patient or surrogate, ordering and performing treatments and interventions, ordering and review of laboratory studies, ordering and review of radiographic studies, pulse oximetry and re-evaluation of patient's condition.         Final Clinical Impression(s) / ED Diagnoses Final diagnoses:  COPD exacerbation (HCC)  Acute respiratory failure with hypoxia [facility])    Rx / DC Orders ED Discharge Orders     None         Jacalyn Lefevre, MD 10/19/23 1605

## 2023-10-19 NOTE — ED Triage Notes (Signed)
Pt reports he has been dizzy x 2 weeks and has fallen multiple times since, he states. Marland Kitchen Has been having a chills, sore throat, nausea mucus drainage, and a fever x 3

## 2023-10-19 NOTE — ED Notes (Signed)
EMS called and en route. Pt and pt family at bedside.

## 2023-10-19 NOTE — ED Triage Notes (Signed)
Pt was bib [facility] from urgent care. Pt states he has been feeling SOB, chills, cold like symptoms for about 2-3 weeks now. Pt went urgent care and o2 stat was 82% placed on 4 liter pt is now 95%. Pt does where oxygen at home at night time but not during the day. Pt does have a hx of COPD.Ems placed a 20G in left forearm, and gave 2.5 albuterol neb

## 2023-10-19 NOTE — ED Notes (Signed)
Patient is being discharged from the Urgent Care and sent to the Emergency Department via POV . Per provider, patient is in need of higher level of care due to fast breathing and hypoxia . Patient is aware and verbalizes understanding of plan of care.  Vitals:   10/19/23 1308  BP: 114/66  Pulse: 84  Resp: (!) 22  Temp: 98.1 F (36.7 C)  SpO2: (!) 82%

## 2023-10-20 DIAGNOSIS — N1832 Chronic kidney disease, stage 3b: Secondary | ICD-10-CM

## 2023-10-20 DIAGNOSIS — I7121 Aneurysm of the ascending aorta, without rupture: Secondary | ICD-10-CM

## 2023-10-20 DIAGNOSIS — Z8673 Personal history of transient ischemic attack (TIA), and cerebral infarction without residual deficits: Secondary | ICD-10-CM

## 2023-10-20 DIAGNOSIS — C679 Malignant neoplasm of bladder, unspecified: Secondary | ICD-10-CM | POA: Diagnosis not present

## 2023-10-20 DIAGNOSIS — I480 Paroxysmal atrial fibrillation: Secondary | ICD-10-CM

## 2023-10-20 DIAGNOSIS — J441 Chronic obstructive pulmonary disease with (acute) exacerbation: Secondary | ICD-10-CM | POA: Diagnosis not present

## 2023-10-20 LAB — CBC
HCT: 36.4 % — ABNORMAL LOW (ref 39.0–52.0)
Hemoglobin: 11.4 g/dL — ABNORMAL LOW (ref 13.0–17.0)
MCH: 31.3 pg (ref 26.0–34.0)
MCHC: 31.3 g/dL (ref 30.0–36.0)
MCV: 100 fL (ref 80.0–100.0)
Platelets: 133 10*3/uL — ABNORMAL LOW (ref 150–400)
RBC: 3.64 MIL/uL — ABNORMAL LOW (ref 4.22–5.81)
RDW: 13.7 % (ref 11.5–15.5)
WBC: 6.8 10*3/uL (ref 4.0–10.5)
nRBC: 0 % (ref 0.0–0.2)

## 2023-10-20 LAB — BASIC METABOLIC PANEL
Anion gap: 8 (ref 5–15)
BUN: 40 mg/dL — ABNORMAL HIGH (ref 8–23)
CO2: 25 mmol/L (ref 22–32)
Calcium: 8.7 mg/dL — ABNORMAL LOW (ref 8.9–10.3)
Chloride: 104 mmol/L (ref 98–111)
Creatinine, Ser: 1.66 mg/dL — ABNORMAL HIGH (ref 0.61–1.24)
GFR, Estimated: 41 mL/min — ABNORMAL LOW (ref >60)
Glucose, Bld: 247 mg/dL — ABNORMAL HIGH (ref 70–99)
Potassium: 3.9 mmol/L (ref 3.5–5.1)
Sodium: 137 mmol/L (ref 135–145)

## 2023-10-20 NOTE — Progress Notes (Signed)
   10/20/23 1358  TOC Brief Assessment  Insurance and Status Reviewed  Patient has primary care physician Yes  Home environment has been reviewed From home with spouse  Prior level of function: Independent  Prior/Current Home Services No current home services  Social Determinants of Health Reivew SDOH reviewed no interventions necessary  Readmission risk has been reviewed Yes  Transition of care needs no transition of care needs at this time   .Transition of Care Department [facility]) has reviewed patient and no TOC needs have been identified at this time. We will continue to monitor patient advancement through interdisciplinary progression rounds. If new patient transition needs arise, please place a TOC consult.

## 2023-10-20 NOTE — Assessment & Plan Note (Signed)
Continue rate control with verapamil and anticoagulation with apixaban.

## 2023-10-20 NOTE — Assessment & Plan Note (Signed)
Patient was placed on medical therapy with systemic and inhaled corticosteroids. Bronchodilator therapy and oral doxycycline.   His symptoms improved, and plan is to continue bronchodilator therapy and inhaled corticosteroids (LABA/ ICS) at home.  Added nebulizer machine to use Duoneb.  Supplemental 02 for ambulation.  Follow up with primary care in 7 to 10 days.

## 2023-10-20 NOTE — Assessment & Plan Note (Addendum)
No acute neurologic deficits.  Continue blood pressure monitoring.  Dyslipidemia, continue with statin.

## 2023-10-20 NOTE — Progress Notes (Signed)
Progress Note   Patient: Kyle Owen ZOX:[id number] DOB: 11-08-1941 DOA: 10/19/2023     1 DOS: the patient was seen and examined on 10/20/2023   Brief hospital course: Mr. Overdorf was admitted to the hospital with the working diagnosis of COPD exacerbation   82 yo male with the past medical history of COPD, chronic hypoxia, atrial fibrillation, and bladder cancer who presented with dyspnea.  Patient with worsening symptoms , he was evaluated at urgent care and was found hypoxemic, down to 82% on room air. In the ED his blood pressure was 121/61, HR 69, RR 24 and 02 saturation 96% on supplemental 02 per [place], lungs with bilateral wheezing, heart with S1 and S2 present and regular, abdomen with no distention and no lower extremity edema.   Chest radiograph with hyperinflation, positive cardiomegaly with no infiltrates or effusions.     Assessment and Plan: * COPD exacerbation (HCC) Improving dyspnea.  At rest 02 saturation is 92%. At home has nocturnal supplemental 02 per [place].   Plan to continue medical therapy with systemic and inhaled corticosteroids. Bronchodilator therapy. Out of bed, PT and OT Check ambulatory 02 saturation on room air prior to his discharge.  Possible discharge tomorrow.    History of stroke No acute neurologic deficits.  Continue blood pressure monitoring.  Dyslipidemia, continue with statin.    Bladder cancer (HCC) Follow up as outpatient.   Paroxysmal atrial fibrillation (HCC) Continue rate control with verapamil and anticoagulation with apixaban.   Thoracic ascending aortic aneurysm (HCC) Continue blood pressure monitoring.   Chronic kidney disease, stage 3b (HCC) Renal function stable, continue blood pressure monitoring.         Subjective: Patient is feeling better, dyspnea has been improving.   Physical Exam: Vitals:   10/20/23 0610 10/20/23 0641 10/20/23 0803 10/20/23 0807  BP:  132/67    Pulse:  71    Resp:  20    Temp: 97.7 F  (36.5 C) 97.6 F (36.4 C)    TempSrc: Axillary Oral    SpO2:  94% 94% 94%  Weight:      Height:       Neurology awake and alert ENT with mild pallor Cardiovascular with S1 and S2 present with no gallops, rubs or murmurs Respiratory with no wheezing, rales or rhonchi.. mild prolongation of expiratory phase Abdomen with no distention  No lower extremity edema  Data Reviewed:    Family Communication: I spoke with patient's wife at the bedside, we talked in detail about patient's condition, plan of care and prognosis and all questions were addressed.   Disposition: Status is: Inpatient Remains inpatient appropriate because: possible discharge tomorrow   Planned Discharge Destination: Home    Author: Coralie Keens, MD 10/20/2023 12:50 PM  For on call review [email or web address].

## 2023-10-20 NOTE — ED Notes (Signed)
Pt in bed with family at bedside. Denies any pain/discomfort. Stated I'm just trying to get comfortable. Pt was repositioned and given extra blanket.

## 2023-10-20 NOTE — Assessment & Plan Note (Signed)
Follow up as outpatient.  Old records personally reviewed, office visit to Dr Retta Diones, noted recurrent CIS of the bladder. Status recent biopsy, noted not a large area of recurrence. Upper tracts with normal retrograde studies.  Getting BCG therapy.

## 2023-10-20 NOTE — Assessment & Plan Note (Signed)
Continue blood pressure monitoring

## 2023-10-20 NOTE — Hospital Course (Signed)
Mr. Kyle Owen was admitted to the hospital with the working diagnosis of COPD exacerbation   82 yo male with the past medical history of COPD, chronic hypoxia, atrial fibrillation, and bladder cancer who presented with dyspnea.  Patient with worsening symptoms , he was evaluated at urgent care and was found hypoxemic, down to 82% on room air. In the ED his blood pressure was 121/61, HR 69, RR 24 and 02 saturation 96% on supplemental 02 per [place], lungs with bilateral wheezing, heart with S1 and S2 present and regular, abdomen with no distention and no lower extremity edema.   Na 139, K 4,0 Cl 102 bicarbonate 27, glucose 118, bun 31 cr 1,68  BNP 239  Lactic acid 1,4 and 1,3 Wbc 10.7 hgb 13.1 plt 152  Sars covid 19 and influenza negative.   Chest radiograph with hyperinflation, positive cardiomegaly with no infiltrates or effusions.   EKG 70 bpm, normal axis, normal intervals, atrial fibrillation rhythm with no significant ST segment or T wave changes.   Patient was placed on supplemental 02 per [place], systemic corticosteroids and bronchodilator therapy.  10/27 symptoms have improved, oxygenation on room air while at rest 90% and on ambulation 87%,  Patient will get a portable supplemental 02 at the time of discharge.

## 2023-10-20 NOTE — Assessment & Plan Note (Signed)
Renal function stable, continue blood pressure monitoring.

## 2023-10-21 DIAGNOSIS — N1832 Chronic kidney disease, stage 3b: Secondary | ICD-10-CM | POA: Diagnosis not present

## 2023-10-21 DIAGNOSIS — I480 Paroxysmal atrial fibrillation: Secondary | ICD-10-CM | POA: Diagnosis not present

## 2023-10-21 DIAGNOSIS — J441 Chronic obstructive pulmonary disease with (acute) exacerbation: Secondary | ICD-10-CM | POA: Diagnosis not present

## 2023-10-21 DIAGNOSIS — Z8673 Personal history of transient ischemic attack (TIA), and cerebral infarction without residual deficits: Secondary | ICD-10-CM | POA: Diagnosis not present

## 2023-10-21 MED ORDER — IPRATROPIUM-ALBUTEROL 0.5-2.5 (3) MG/3ML IN SOLN: 3.0000 mL | Freq: Four times a day (QID) | RESPIRATORY_TRACT | 0 refills | Status: AC | PRN

## 2023-10-21 MED ORDER — IPRATROPIUM-ALBUTEROL 0.5-2.5 (3) MG/3ML IN SOLN
3.0000 mL | Freq: Three times a day (TID) | RESPIRATORY_TRACT | Status: DC
  Filled 2023-10-21: qty 3

## 2023-10-21 NOTE — Progress Notes (Signed)
At 0914 patient declined nebulizer and MDI stating" I am feeling fine, I don't need that it is not the medication I use at home." I explained the reason for the substitute he still declined  treatment and said he was due for discharge to day. Mr. Groesbeck did not appear in any respiratory distress, his BBS clear and RA SpO2 95%. Patient was left in chair with  chair alarm on.Marland Kitchen

## 2023-10-21 NOTE — Evaluation (Signed)
Physical Therapy Evaluation Patient Details Name: Kyle Owen MRN: [id number] DOB: 02/04/1941 Today's Date: 10/21/2023  History of Present Illness  Kyle Owen  is a 82 y.o. male, with past medical history of COPD, respiratory failure on oxygen requirement at bedtime, COPD, depression, paroxysmal A-fib on Eliquis, bladder cancer, plan for biopsy next week at [facility].  -Patient presents to ED secondary to complaints of shortness of breath, wife at bedside assist with the history as he is hard of hearing, patient went to urgent care initially with dyspnea, his O2 sat was noted to be 82%, so he was sent to ED for further evaluation, patient reports generalized weakness, fatigue, dyspnea, given 1 L by EMS en route to the hospital, upon presentation he was hypoxic requiring 4 L oxygen, patient reports chills, unsure about fever, denies chest pain, reports cough with phlegm.  -In ED Significant for creatinine of 1.68, lactic acid within normal limit, BNP of 239, white blood cell count of 10.7, x-ray significant for chronic finding of scarring, given dyspnea, worsening hypoxia, [facility] hospitalist consulted to admit for COPD exacerbation    Clinical Impression  Patient sitting up in chair on therapist arrival.  He is Hard of hearing but understands well if the speaker is looking directly at him and uses increased volume.  He is pleasant and agreeable to therapist assessment.  Patient performs sit to stand with CGA for safety and walks in hallway with SPC and CGA to occasional min A for balance.  Patient is a bit overconfident in his walking ability as he tends to walk with a shuffling type pattern with small base of support and occasionally demonstrates path deviation and difficulty with direction changes.  PT discusses with patient use of RW for increased safety but he thinks he will be fine with the cane.  Patient returns to the room and sits back into the chair.  patient left in chair with chair alarm  set with call button in reach and nursing notified of mobility status. Patient will benefit from continued skilled therapy services during the remainder of his hospital stay and at the next recommended venue of care to address deficits and promote return to optimal function.           If plan is discharge home, recommend the following: A little help with walking and/or transfers;A little help with bathing/dressing/bathroom;Help with stairs or ramp for entrance   Can travel by private vehicle        Equipment Recommendations None recommended by PT  Recommendations for Other Services       Functional Status Assessment Patient has had a recent decline in their functional status and demonstrates the ability to make significant improvements in function in a reasonable and predictable amount of time.     Precautions / Restrictions Precautions Precautions: Fall Precaution Comments: decreased safety awareness Restrictions Weight Bearing Restrictions: No      Mobility  Bed Mobility               General bed mobility comments: patient up in chair assisted by nursing Patient Response: Cooperative  Transfers Overall transfer level: Needs assistance Equipment used: Straight cane Transfers: Sit to/from Stand Sit to Stand: Contact guard assist           General transfer comment: CGA for balance    Ambulation/Gait Ambulation/Gait assistance: Min assist, Contact guard assist Gait Distance (Feet): 65 Feet Assistive device: Straight cane Gait Pattern/deviations: Knee flexed in stance - right, Knee flexed in stance -  left, Drifts right/left, Narrow base of support, Shuffle       General Gait Details: decreased gait speed; need occasional min assist to maintain balance  Stairs            Wheelchair Mobility     Tilt Bed Tilt Bed Patient Response: Cooperative  Modified Rankin (Stroke Patients Only)       Balance Overall balance assessment: Needs  assistance Sitting-balance support: Feet supported, Bilateral upper extremity supported Sitting balance-Leahy Scale: Good Sitting balance - Comments: good sitting balance in the recliner   Standing balance support: Single extremity supported, Reliant on assistive device for balance, During functional activity Standing balance-Leahy Scale: Fair Standing balance comment: fair standing balance with SPC and CGA/min A from therapist                             Pertinent Vitals/Pain Pain Assessment Pain Assessment: 0-10 Pain Location: no pain at rest; 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 Pain Intervention(s): Monitored during session    Home Living Family/patient expects to be discharged to:: Private residence Living Arrangements: Spouse/significant other Available Help at Discharge: Family Type of Home: House Home Access: Stairs to enter Entrance Stairs-Rails: Right;Left;Can reach both Entrance Stairs-Number of Steps: 2   Home Layout: Two level;Able to live on main level with bedroom/bathroom Home Equipment: Rolling Walker (2 wheels);Cane - single point;BSC/3in1;Grab bars - tub/shower;Grab bars - toilet      Prior Function Prior Level of Function : Independent/Modified Independent;History of Falls (last six months)             Mobility Comments: states maybe 2 or 3 falls in the past 6 months       Extremity/Trunk Assessment   Upper Extremity Assessment Upper Extremity Assessment: Right hand dominant    Lower Extremity Assessment Lower Extremity Assessment: Generalized weakness    Cervical / Trunk Assessment Cervical / Trunk Assessment: Normal  Communication   Communication Communication: Hearing impairment  Cognition Arousal: Alert Behavior During Therapy: WFL for tasks assessed/performed Overall Cognitive Status: Within Functional Limits for tasks assessed                                 General Comments:  decreased safety awareness with walking; more confident in his walking ability; he would be safest with a RW versus his [facility]        General Comments      Exercises     Assessment/Plan    PT Assessment Patient needs continued PT services  PT Problem List Decreased strength;Decreased activity tolerance;Decreased balance;Decreased mobility       PT Treatment Interventions Gait training;Functional mobility training;Therapeutic activities;Therapeutic exercise;Balance training;Patient/family education;Stair training    PT Goals (Current goals can be found in the Care Plan section)  Acute Rehab PT Goals Patient Stated Goal: return home PT Goal Formulation: With patient Time For Goal Achievement: 11/04/23 Potential to Achieve Goals: Good    Frequency Min 2X/week     Co-evaluation               AM-PAC PT "6 Clicks" Mobility  Outcome Measure Help needed turning from your back to your side while in a flat bed without using bedrails?: None Help needed moving from lying on your back to sitting on the side of a flat bed without using bedrails?: None Help needed moving to and from a bed  to a chair (including a wheelchair)?: A Little Help needed standing up from a chair using your arms (e.g., wheelchair or bedside chair)?: A Little Help needed to walk in hospital room?: A Little Help needed climbing 3-5 steps with a railing? : A Lot 6 Click Score: 19    End of Session Equipment Utilized During Treatment: Gait belt Activity Tolerance: Patient tolerated treatment well Patient left: in chair;with call bell/phone within reach;with chair alarm set Nurse Communication: Mobility status PT Visit Diagnosis: Unsteadiness on feet (R26.81);Other abnormalities of gait and mobility (R26.89);History of falling (Z91.81);Muscle weakness (generalized) (M62.81)    Time: 2440-1027 PT Time Calculation (min) (ACUTE ONLY): 25 min   Charges:   PT Evaluation $PT Eval Low Complexity: 1 Low   PT  General Charges $$ ACUTE PT VISIT: 1 Visit         10:43 AM, 10/21/23 Finnian Husted Small Ritta Hammes MPT [facility] [place] [phone] Ph:[phone]

## 2023-10-21 NOTE — Progress Notes (Signed)
SATURATION QUALIFICATIONS:   Patient Saturations on Room Air at Rest = 90%  Patient Saturations on Room Air while Ambulating = 87%  Patient Saturations on 2 Liters of oxygen while Ambulating = 92%  Please briefly explain why patient needs home oxygen: Per report, pt is on oxygen at night time. He desat with ambulation at 87%, and was on 92% using 2L of Oxygen. Pt is currently resting in the chair after ambulation, on room air, 90%.

## 2023-10-21 NOTE — TOC Progression Note (Addendum)
Transition of Care [facility]) - Progression Note    Patient Details  Name: Kyle Owen MRN: [id number] Date of Birth: 1941-10-08  [facility] CM/SW Contact  Kyle Gravel, LCSW Phone Number: 10/21/2023, 11:48 AM  Clinical Narrative:    PT recommended HHPT, CSW met pt in room as he sat in chair.  Asked if he is interested. He agreed saying he has done therapy before.  Mentioned he did not know agency but wife Kyle Owen would.  As I left room 2 nurses indicated that his wife would respond better as pt may not understand.  CSW attempted to call wife at cell and home number no answer.  Referred to [facility] as pt agreed- [facility] no longer in network with [facility]. Referred to [facility], awaiting response. TOC to follow.    Addendum:  No response from [facility] accepted, added to AVS. Pt has new 02 need referred to [facility]-verifying ins, will bring temp tank if accepted.  TOC to follow  [facility] accepted, provided wife's contact information and shared that DC today.  No further TOC needs.  Follow up:  CSW visited wife and pt at bedside. Family has 02 at home night time only- with [facility]- now that new order is continuous- he needs a portable tank too as he enjoys being outside. Pt an wife willing to  switch to [facility] as need 02 now, drops when mobilizing. No further TOC needs, pt DC.  .  Barriers to Discharge: No Barriers Identified  Expected Discharge Plan and Services         Expected Discharge Date: 10/21/23                                     Social Determinants of Health (SDOH) Interventions SDOH Screenings   Food Insecurity: No Food Insecurity (10/20/2023)  Housing: Low Risk  (10/20/2023)  Transportation Needs: No Transportation Needs (10/20/2023)  Utilities: Not At Risk (10/20/2023)  Tobacco Use: Medium Risk (10/19/2023)    Readmission Risk Interventions     No data to display

## 2023-10-21 NOTE — Discharge Summary (Signed)
Physician Discharge Summary   Patient: Kyle Owen MRN: [id number] DOB: [birth year]  Admit date:     10/19/2023  Discharge date: 10/21/23  Discharge Physician: Kyle Owen   PCP: Kyle Perches, MD   Recommendations at discharge:    Patient will continue LABA/ ICS inhaler therapy. Added as needed duoneb. Added supplemental 02 on ambulation. Follow up with Kyle. Ouida Owen in 7 to 10 days.   Discharge Diagnoses: Principal Problem:   COPD exacerbation (HCC) Active Problems:   History of stroke   Paroxysmal atrial fibrillation (HCC)   Chronic kidney disease, stage 3b (HCC)   Thoracic ascending aortic aneurysm (HCC)   Bladder cancer (HCC)  Resolved Problems:   * No resolved hospital problems. [facility] Course: [name] was admitted to the hospital with the working diagnosis of COPD exacerbation   83 yo male with the past medical history of COPD, chronic hypoxia, atrial fibrillation, and bladder cancer who presented with dyspnea.  Patient with worsening symptoms , he was evaluated at urgent care and was found hypoxemic, down to 82% on room air. In the ED his blood pressure was 121/61, HR 69, RR 24 and 02 saturation 96% on supplemental 02 per [place], lungs with bilateral wheezing, heart with S1 and S2 present and regular, abdomen with no distention and no lower extremity edema.   Na 139, K 4,0 Cl 102 bicarbonate 27, glucose 118, bun 31 cr 1,68  BNP 239  Lactic acid 1,4 and 1,3 Wbc 10.7 hgb 13.1 plt 152  Sars covid 19 and influenza negative.   Chest radiograph with hyperinflation, positive cardiomegaly with no infiltrates or effusions.   EKG 70 bpm, normal axis, normal intervals, atrial fibrillation rhythm with no significant ST segment or T wave changes.   Patient was placed on supplemental 02 per [place], systemic corticosteroids and bronchodilator therapy.  10/27 symptoms have improved, oxygenation on room air while at rest 90% and on ambulation 87%,  Patient will get a  portable supplemental 02 at the time of discharge.    Assessment and Plan: * COPD exacerbation (HCC) Patient was placed on medical therapy with systemic and inhaled corticosteroids. Bronchodilator therapy and oral doxycycline.   His symptoms improved, and plan is to continue bronchodilator therapy and inhaled corticosteroids (LABA/ ICS) at home.  Added nebulizer machine to use Duoneb.  Supplemental 02 for ambulation.  Follow up with primary care in 7 to 10 days.   History of stroke No acute neurologic deficits.   Dyslipidemia, continue with statin.    Paroxysmal atrial fibrillation (HCC) Continue rate control with verapamil and anticoagulation with apixaban.   Chronic kidney disease, stage 3b (HCC) Renal function stable, continue blood pressure monitoring.   Thoracic ascending aortic aneurysm (HCC) Continue blood control.   Bladder cancer (HCC) Follow up as outpatient.  Old records personally reviewed, office visit to Kyle Owen, noted recurrent CIS of the bladder. Status recent biopsy, noted not a large area of recurrence. Upper tracts with normal retrograde studies.  Getting BCG therapy.          Consultants: none Procedures performed: none   Disposition: Home Diet recommendation:  Discharge Diet Orders (From admission, onward)     Start     Ordered   10/21/23 0000  Diet - low sodium heart healthy        10/21/23 1124           Cardiac diet DISCHARGE MEDICATION: Allergies as of 10/21/2023       Reactions  Flomax [tamsulosin Hcl] Nausea And Vomiting, Other (See Comments)   "Pt thought he was dying " DIZZY   Fentanyl Other (See Comments)   Makes me combative  Pt states he's had it since then and was fine 10-19-23        Medication List     TAKE these medications    acetaminophen 500 MG tablet Commonly known as: TYLENOL Take 500 mg by mouth in the morning and at bedtime.   atorvastatin 20 MG tablet Commonly known as: LIPITOR TAKE 1  TABLET BY MOUTH DAILY.   budesonide-formoterol 160-4.5 MCG/ACT inhaler Commonly known as: SYMBICORT INHALE 2 PUFFS INTO THE LUNGS TWICE DAILY.   cetirizine 10 MG tablet Commonly known as: ZYRTEC Take 10 mg by mouth daily.   Eliquis 5 MG Tabs tablet Generic drug: apixaban Take 5 mg by mouth 2 (two) times daily.   finasteride 5 MG tablet Commonly known as: PROSCAR Take 5 mg by mouth in the morning.   ipratropium-albuterol 0.5-2.5 (3) MG/3ML Soln Commonly known as: DUONEB Take 3 mLs by nebulization every 6 (six) hours as needed (as needed for shortness of breath or wheezing).   Magnesium 250 MG Tabs Take 250 mg by mouth in the morning and at bedtime.   OXYGEN Inhale 2.5 L into the lungs at bedtime.   sertraline 50 MG tablet Commonly known as: ZOLOFT Take 50 mg by mouth every morning.   Ventolin HFA 108 (90 Base) MCG/ACT inhaler Generic drug: albuterol Inhale 2 puffs into the lungs Every 4 hours as needed for wheezing or shortness of breath.   verapamil 240 MG CR tablet Commonly known as: CALAN-SR Take 240 mg by mouth in the morning.   Vitamin D 50 MCG (2000 UT) tablet Take 2,000 Units by mouth in the morning.               Durable Medical Equipment  (From admission, onward)           Start     Ordered   10/21/23 1125  For home use only DME oxygen  Once       Question Answer Comment  Length of Need 6 Months   Mode or (Route) Nasal cannula   Liters per Minute 2   Frequency Continuous (stationary and portable oxygen unit needed)   Oxygen conserving device Yes   Oxygen delivery system Gas      10/21/23 1124   10/21/23 0000  For home use only DME Nebulizer machine       Question Answer Comment  Patient needs a nebulizer to treat with the following condition COPD exacerbation (HCC)   Length of Need 12 Months      10/21/23 1124            Discharge Exam: Filed Weights   10/19/23 1422  Weight: 88 kg   BP 135/65 (BP Location: Right Arm)    Pulse 91   Temp 97.7 F (36.5 C)   Resp 19   Ht 6\' 1"  (1.854 m)   Wt 88 kg   SpO2 95%   BMI 25.60 kg/m   Patient with no chest pain, dyspnea has improved and feeling back to baseline.   Neurology awake and alert ENT with no pallor Cardiovascular with S1 and S2 present, irregularly irregular with no gallops, rubs or murmurs Respiratory with mild prolonged expiratory phase with no rales or wheezing, no rhonchi Abdomen with no distention  No lower extremity edema   Condition at discharge: stable  The  results of significant diagnostics from this hospitalization (including imaging, microbiology, ancillary and laboratory) are listed below for reference.   Imaging Studies: DG Chest Port 1 View  Result Date: 10/19/2023 CLINICAL DATA:  Shortness of breath.  Fever. EXAM: PORTABLE CHEST 1 VIEW COMPARISON:  September 08, 2022.  January 24, 2023. FINDINGS: Stable cardiomediastinal silhouette. Stable bibasilar interstitial densities are noted most consistent with scarring or possibly atelectasis, left greater than right. No acute abnormality seen involving the bony thorax. IMPRESSION: Stable bibasilar interstitial densities are noted most consistent with scarring or possibly atelectasis, left greater than right. Electronically Signed   By: Lupita Raider M.D.   On: 10/19/2023 15:35    Microbiology: Results for orders placed or performed during the hospital encounter of 10/19/23  Resp panel by RT-PCR (RSV, Flu A&B, Covid) Anterior Nasal Swab     Status: None   Collection Time: 10/19/23  2:22 PM   Specimen: Anterior Nasal Swab  Result Value Ref Range Status   SARS Coronavirus 2 by RT PCR NEGATIVE NEGATIVE Final    Comment: (NOTE) SARS-CoV-2 target nucleic acids are NOT DETECTED.  The SARS-CoV-2 RNA is generally detectable in upper respiratory specimens during the acute phase of infection. The lowest concentration of SARS-CoV-2 viral copies this assay can detect is 138 copies/mL. A negative  result does not preclude SARS-Cov-2 infection and should not be used as the sole basis for treatment or other patient management decisions. A negative result may occur with  improper specimen collection/handling, submission of specimen other than nasopharyngeal swab, presence of viral mutation(s) within the areas targeted by this assay, and inadequate number of viral copies(<138 copies/mL). A negative result must be combined with clinical observations, patient history, and epidemiological information. The expected result is Negative.  Fact Sheet for Patients:  BloggerCourse.com  Fact Sheet for Healthcare Providers:  SeriousBroker.it  This test is no t yet approved or cleared by the [place] FDA and  has been authorized for detection and/or diagnosis of SARS-CoV-2 by FDA under an Emergency Use Authorization (EUA). This EUA will remain  in effect (meaning this test can be used) for the duration of the COVID-19 declaration under Section 564(b)(1) of the Act, 21 U.S.C.section 360bbb-3(b)(1), unless the authorization is terminated  or revoked sooner.       Influenza A by PCR NEGATIVE NEGATIVE Final   Influenza B by PCR NEGATIVE NEGATIVE Final    Comment: (NOTE) The Xpert Xpress SARS-CoV-2/FLU/RSV plus assay is intended as an aid in the diagnosis of influenza from Nasopharyngeal swab specimens and should not be used as a sole basis for treatment. Nasal washings and aspirates are unacceptable for Xpert Xpress SARS-CoV-2/FLU/RSV testing.  Fact Sheet for Patients: BloggerCourse.com  Fact Sheet for Healthcare Providers: SeriousBroker.it  This test is not yet approved or cleared by the [place] FDA and has been authorized for detection and/or diagnosis of SARS-CoV-2 by FDA under an Emergency Use Authorization (EUA). This EUA will remain in effect (meaning this test can be used)  for the duration of the COVID-19 declaration under Section 564(b)(1) of the Act, 21 U.S.C. section 360bbb-3(b)(1), unless the authorization is terminated or revoked.     Resp Syncytial Virus by PCR NEGATIVE NEGATIVE Final    Comment: (NOTE) Fact Sheet for Patients: BloggerCourse.com  Fact Sheet for Healthcare Providers: SeriousBroker.it  This test is not yet approved or cleared by the [place] FDA and has been authorized for detection and/or diagnosis of SARS-CoV-2 by FDA under an Emergency Use Authorization (  EUA). This EUA will remain in effect (meaning this test can be used) for the duration of the COVID-19 declaration under Section 564(b)(1) of the Act, 21 U.S.C. section 360bbb-3(b)(1), unless the authorization is terminated or revoked.  Performed at [facility], [address], [place], [place] [address]   Culture, blood (routine x 2)     Status: None (Preliminary result)   Collection Time: 10/19/23  2:22 PM   Specimen: BLOOD  Result Value Ref Range Status   Specimen Description BLOOD  Final   Special Requests NONE  Final   Culture   Final    NO GROWTH 2 DAYS Performed at [facility], [address] [place], [place] [address]    Report Status PENDING  Incomplete  Culture, blood (routine x 2)     Status: None (Preliminary result)   Collection Time: 10/19/23  2:50 PM   Specimen: BLOOD  Result Value Ref Range Status   Specimen Description BLOOD BLOOD RIGHT ARM ac  Final   Special Requests NONE  Final   Culture   Final    NO GROWTH 2 DAYS Performed at [facility], [address] [place], [place] [address]    Report Status PENDING  Incomplete    Labs: CBC: Recent Labs  Lab 10/19/23 1422 10/20/23 0500  WBC 10.7* 6.8  NEUTROABS 8.4*  --   HGB 13.1 11.4*  HCT 41.4 36.4*  MCV 98.8 100.0  PLT 152 133*   Basic Metabolic Panel: Recent Labs  Lab 10/19/23 1422 10/20/23 0500  NA 139 137  K 4.0 3.9  CL 102  104  CO2 27 25  GLUCOSE 118* 247*  BUN 31* 40*  CREATININE 1.68* 1.66*  CALCIUM 8.8* 8.7*   Liver Function Tests: No results for input(s): "AST", "ALT", "ALKPHOS", "BILITOT", "PROT", "ALBUMIN" in the last 168 hours. CBG: No results for input(s): "GLUCAP" in the last 168 hours.  Discharge time spent: greater than 30 minutes.  Signed: Coralie Keens, MD [facility] 10/21/2023

## 2023-10-21 NOTE — Progress Notes (Signed)
Pt has DC order, AVS was given and explained to wife, Jamesetta So. All questions has been answered. Pending HHPT, MD was aware, agency will call once accepted. CM is currently working on the portable O2.

## 2023-10-21 NOTE — Plan of Care (Signed)
  Problem: Acute Rehab PT Goals(only PT should resolve) Goal: Pt Will Go Supine/Side To Sit Outcome: Progressing Flowsheets (Taken 10/21/2023 1044) Pt will go Supine/Side to Sit: with supervision Goal: Patient Will Transfer Sit To/From Stand Outcome: Progressing Flowsheets (Taken 10/21/2023 1044) Patient will transfer sit to/from stand: with supervision Goal: Pt Will Transfer Bed To Chair/Chair To Bed Outcome: Progressing Flowsheets (Taken 10/21/2023 1044) Pt will Transfer Bed to Chair/Chair to Bed: with supervision Goal: Pt Will Ambulate Outcome: Progressing Flowsheets (Taken 10/21/2023 1044) Pt will Ambulate:  100 feet  with supervision  with least restrictive assistive device

## 2023-10-23 NOTE — Patient Instructions (Addendum)
SURGICAL WAITING ROOM VISITATION Patients having surgery or a procedure may have no more than 2 support people in the waiting area - these visitors may rotate.    Children under the age of 23 must have an adult with them who is not the patient.  If the patient needs to stay at the hospital during part of their recovery, the visitor guidelines for inpatient rooms apply. Pre-op nurse will coordinate an appropriate time for 1 support person to accompany patient in pre-op.  This support person may not rotate.    Please refer to the [facility] website for the visitor guidelines for Inpatients (after your surgery is over and you are in a regular room).       Your procedure is scheduled on: 10-31-23   Report to [facility] Main Entrance    Report to admitting at 6:15 AM   Call this number if you have problems the morning of surgery [phone]   Do not eat food or drink liquids :After Midnight.          If you have questions, please contact your surgeon's office.   FOLLOW  ANY ADDITIONAL PRE OP INSTRUCTIONS YOU RECEIVED FROM YOUR SURGEON'S OFFICE!!!     Oral Hygiene is also important to reduce your risk of infection.                                    Remember - BRUSH YOUR TEETH THE MORNING OF SURGERY WITH YOUR REGULAR TOOTHPASTE   Do NOT smoke after Midnight   Take these medicines the morning of surgery with A SIP OF WATER:   Atorvastatin  Zyrtec  Finasteride  Sertraline  Verapamil  Okay to use inhalers  Tylenol if needed  Stop all vitamins and herbal supplements 7 days before surgery  Bring CPAP mask and tubing day of surgery.                              You may not have any metal on your body including hair pins, jewelry, and body piercing             Do not wear make-up, lotions, powders, perfumes/cologne, or deodorant              Men may shave face and neck.   Do not bring valuables to the hospital. [facility] IS NOT RESPONSIBLE   FOR  VALUABLES.   Contacts, dentures or bridgework may not be worn into surgery.  DO NOT BRING YOUR HOME MEDICATIONS TO THE HOSPITAL. PHARMACY WILL DISPENSE MEDICATIONS LISTED ON YOUR MEDICATION LIST TO YOU DURING YOUR ADMISSION IN THE HOSPITAL!    Patients discharged on the day of surgery will not be allowed to drive home.  Someone NEEDS to stay with you for the first 24 hours after anesthesia.   Special Instructions: Bring a copy of your healthcare power of attorney and living will documents the day of surgery if you haven't scanned them before.              Please read over the following fact sheets you were given: IF YOU HAVE QUESTIONS ABOUT YOUR PRE-OP INSTRUCTIONS PLEASE CALL [phone]   If you received a COVID test during your pre-op visit  it is requested that you wear a mask when out in public, stay away from anyone that may not be feeling  well and notify your surgeon if you develop symptoms. If you test positive for Covid or have been in contact with anyone that has tested positive in the last 10 days please notify you surgeon.  [place] - Preparing for Surgery Before surgery, you can play an important role.  Because skin is not sterile, your skin needs to be as free of germs as possible.  You can reduce the number of germs on your skin by washing with CHG (chlorahexidine gluconate) soap before surgery.  CHG is an antiseptic cleaner which kills germs and bonds with the skin to continue killing germs even after washing. Please DO NOT use if you have an allergy to CHG or antibacterial soaps.  If your skin becomes reddened/irritated stop using the CHG and inform your nurse when you arrive at Short Stay. Do not shave (including legs and underarms) for at least 48 hours prior to the first CHG shower.  You may shave your face/neck.  Please follow these instructions carefully:  1.  Shower with CHG Soap the night before surgery and the  morning of surgery.  2.  If you choose to wash your  hair, wash your hair first as usual with your normal  shampoo.  3.  After you shampoo, rinse your hair and body thoroughly to remove the shampoo.                             4.  Use CHG as you would any other liquid soap.  You can apply chg directly to the skin and wash.  Gently with a scrungie or clean washcloth.  5.  Apply the CHG Soap to your body ONLY FROM THE NECK DOWN.   Do   not use on face/ open                           Wound or open sores. Avoid contact with eyes, ears mouth and   genitals (private parts).                       Wash face,  Genitals (private parts) with your normal soap.             6.  Wash thoroughly, paying special attention to the area where your    surgery  will be performed.  7.  Thoroughly rinse your body with warm water from the neck down.  8.  DO NOT shower/wash with your normal soap after using and rinsing off the CHG Soap.                9.  Pat yourself dry with a clean towel.            10.  Wear clean pajamas.            11.  Place clean sheets on your bed the night of your first shower and do not  sleep with pets. Day of Surgery : Do not apply any lotions/deodorants the morning of surgery.  Please wear clean clothes to the hospital/surgery center.  FAILURE TO FOLLOW THESE INSTRUCTIONS MAY RESULT IN THE CANCELLATION OF YOUR SURGERY  PATIENT SIGNATURE_________________________________  NURSE SIGNATURE__________________________________  ________________________________________________________________________

## 2023-10-25 LAB — CULTURE, BLOOD (ROUTINE X 2)
Culture: NO GROWTH
Culture: NO GROWTH

## 2023-10-26 ENCOUNTER — Encounter (HOSPITAL_COMMUNITY)
Admission: RE | Admit: 2023-10-26 | Discharge: 2023-10-26 | Disposition: A | Discharge status: Home / Self Care | Payer: Medicare Other | Source: Ambulatory Visit | Attending: Internal Medicine | Admitting: Internal Medicine

## 2023-10-28 NOTE — Progress Notes (Signed)
The patient was identified using 2 approved identifiers. All issues noted in this document were discussed and addressed, Kyle Owen did the PST interview for her husband and she       voiced understanding and agreement with all preoperative instructions. The patient was emailed the surgery instructions per Kyle Owen's request.   [email or web address]   The patient was recently admitted to the hospital and there have been no changes in medications since discharge. I asked Kyle Owen to contact the Admitting Office [phone] or [phone]) to complete their Pre-surgical Interview.    COVID Vaccine received:  []  No [x]  Yes Date of any COVID positive Test in last 90 days:  none  PCP - Kyle Perches, Kyle Owen Cardiologist - Kyle Schultz, Kyle Owen  (04-09-2023 Kyle Favre, Kyle Owen cardiac clearance)  updated ? Clearance.  Pulmonology- Kyle Mourning, Kyle Owen   Chest x-ray - 09-08-22 Epic. CT chest 01-24-23 Epic. CT angio chest aorta 10-24-22 Epic  EKG -  10-19-2023  Epic Stress Test - 06-08-2017  Epic ECHO - 07-27-2015  Epic Cardiac Cath -   PCR screen: []  Ordered & Completed []   No Order but Needs PROFEND     [x]   N/A for this surgery  Surgery Plan:  [x]  Ambulatory   []  Outpatient in bed  []  Admit Anesthesia:    [x]  General  []  Spinal  []   Choice []   MAC  Bowel Prep - [x]  No  []   Yes ____  Pacemaker / ICD device [x]  No []  Yes   Spinal Cord Stimulator:[x]  No []  Yes    ILR- battery at EOL    History of Sleep Apnea? [x]  No []  Yes   CPAP used?- [x]  No []  Yes   Home O2 at ??  Does the patient monitor blood sugar?   [x]  N/A   []  No []  Yes  Patient has: [x]  NO Hx DM   []  Pre-DM   []  DM1  []   DM2  Blood Thinner / Instructions: ELIQUIS:  hold x2 days per patient (told to do so by Urology). I called Kyle Owen office and LM with Kyle Owen?  Aspirin Instructions: none  ERAS Protocol Ordered: [x]  No  []  Yes Patient is to be NPO after: midnight prior  Dental hx: []  Dentures:  [x]  N/A      []  Bridge or  Partial:                   []  Loose or Damaged teeth:   Comments: admitted to [facility] on 10-19-23 to 10-21-2023 for exacerbation of COPD  Activity level:  Can go up a flight of stairs and perform activities of daily living without stopping and without symptoms of chest pain.  Patient has shortness of breath with exertion, states that this has not worsened.     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    Patient denies shortness of breath, fever, cough and chest pain at PAT appointment.  Patient verbalized understanding and agreement to the Pre-Surgical Instructions that were given to them at this PAT appointment. Patient was also educated of the need to review these PAT instructions again prior to Kyle surgery.I reviewed the appropriate phone numbers to call if they have any and questions or concerns.

## 2023-10-28 NOTE — Patient Instructions (Signed)
SURGICAL WAITING ROOM VISITATION Patients having surgery or a procedure may have no more than 2 support people in the waiting area - these visitors may rotate in the visitor waiting room.   Due to an increase in RSV and influenza rates and associated hospitalizations, children ages 74 and under may not visit patients in [facility] hospitals. If the patient needs to stay at the hospital during part of their recovery, the visitor guidelines for inpatient rooms apply.  PRE-OP VISITATION  Pre-op nurse will coordinate an appropriate time for 1 support person to accompany the patient in pre-op.  This support person may not rotate.  This visitor will be contacted when the time is appropriate for the visitor to come back in the pre-op area.  Please refer to the [facility] website for the visitor guidelines for Inpatients (after your surgery is over and you are in a regular room).  You are not required to quarantine at this time prior to your surgery. However, you must do this: Hand Hygiene often Do NOT share personal items Notify your provider if you are in close contact with someone who has COVID or you develop fever 100.4 or greater, new onset of sneezing, cough, sore throat, shortness of breath or body aches.  If you test positive for Covid or have been in contact with anyone that has tested positive in the last 10 days please notify you surgeon.    Your procedure is scheduled on:  Wednesday  October 31, 2023  Report to [facility] Main Entrance: Leota Jacobsen entrance where the [employer] is available.   Report to admitting at: 06:15    AM  Call this number if you have any questions or problems the morning of surgery [phone]  DO NOT EAT OR DRINK ANYTHING AFTER MIDNIGHT THE NIGHT PRIOR TO YOUR SURGERY / PROCEDURE.   FOLLOW  ANY ADDITIONAL PRE OP INSTRUCTIONS YOU RECEIVED FROM YOUR SURGEON'S OFFICE!!!   Oral Hygiene is also important to reduce your risk of infection.         Remember - BRUSH YOUR TEETH THE MORNING OF SURGERY WITH YOUR REGULAR TOOTHPASTE  Do NOT smoke after Midnight the night before surgery.  STOP TAKING all Vitamins, Herbs and supplements 1 week before your surgery.   ELIQUIS  ? Hold x 2 days /????  Take ONLY these medicines the morning of surgery with A SIP OF WATER:  Finasteride, Sertraline, Verapamil. You may take Tylenol if needed for pain.  You may use your Inhaler and nebulizer if needed.    If You have been diagnosed with Sleep Apnea - Bring CPAP mask and tubing day of surgery. We will provide you with a CPAP machine on the day of your surgery.                   You may not have any metal on your body including  jewelry, and body piercing  Do not wear  lotions, powders, cologne, or deodorant   Men may shave face and neck.  Contacts, Hearing Aids, dentures or bridgework may not be worn into surgery. DENTURES WILL BE REMOVED PRIOR TO SURGERY PLEASE DO NOT APPLY "Poly grip" OR ADHESIVES!!!   Patients discharged on the day of surgery will not be allowed to drive home.  Someone NEEDS to stay with you for the first 24 hours after anesthesia.  Do not bring your home medications to the hospital. The Pharmacy will dispense medications listed on your medication list to you  during your admission in the Hospital.   Please read over the following fact sheets you were given: IF YOU HAVE QUESTIONS ABOUT YOUR PRE-OP INSTRUCTIONS, PLEASE CALL [phone].   [place] - Preparing for Surgery Before surgery, you can play an important role.  Because skin is not sterile, your skin needs to be as free of germs as possible.  You can reduce the number of germs on your skin by washing with CHG (chlorahexidine gluconate) soap before surgery.  CHG is an antiseptic cleaner which kills germs and bonds with the skin to continue killing germs even after washing. Please DO NOT use if you have an allergy to CHG or antibacterial soaps.  If your skin becomes  reddened/irritated stop using the CHG and inform your nurse when you arrive at Short Stay. Do not shave (including legs and underarms) for at least 48 hours prior to the first CHG shower.  You may shave your face/neck.  Please follow these instructions carefully:  1.  Shower with CHG Soap the night before surgery and the  morning of surgery.  2.  If you choose to wash your hair, wash your hair first as usual with your normal  shampoo.  3.  After you shampoo, rinse your hair and body thoroughly to remove the shampoo.                             4.  Use CHG as you would any other liquid soap.  You can apply chg directly to the skin and wash.  Gently with a scrungie or clean washcloth.  5.  Apply the CHG Soap to your body ONLY FROM THE NECK DOWN.   Do not use on face/ open                           Wound or open sores. Avoid contact with eyes, ears mouth and genitals (private parts).                       Wash face,  Genitals (private parts) with your normal soap.             6.  Wash thoroughly, paying special attention to the area where your  surgery  will be performed.  7.  Thoroughly rinse your body with warm water from the neck down.  8.  DO NOT shower/wash with your normal soap after using and rinsing off the CHG Soap.            9.  Pat yourself dry with a clean towel.            10.  Wear clean pajamas.            11.  Place clean sheets on your bed the night of your first shower and do not  sleep with pets.  ON THE DAY OF SURGERY : Do not apply any lotions/deodorants the morning of surgery.  Please wear clean clothes to the hospital/surgery center.    FAILURE TO FOLLOW THESE INSTRUCTIONS MAY RESULT IN THE CANCELLATION OF YOUR SURGERY  PATIENT SIGNATURE_________________________________  NURSE SIGNATURE__________________________________  ________________________________________________________________________

## 2023-10-29 ENCOUNTER — Encounter (HOSPITAL_COMMUNITY): Payer: Self-pay

## 2023-10-29 ENCOUNTER — Encounter (HOSPITAL_COMMUNITY)
Admission: RE | Admit: 2023-10-29 | Discharge: 2023-10-29 | Disposition: A | Discharge status: Home / Self Care | Payer: Medicare Other | Source: Ambulatory Visit | Attending: Urology | Admitting: Urology

## 2023-10-29 ENCOUNTER — Other Ambulatory Visit: Payer: Self-pay

## 2023-10-30 ENCOUNTER — Telehealth: Payer: Self-pay | Admitting: *Deleted

## 2023-10-30 NOTE — Telephone Encounter (Signed)
   Pre-operative Risk Assessment    Patient Name: Kyle Owen  DOB: 08/25/1941 MRN: [id number]    DATE OF LAST VISIT: 11/03/2022 DR. SKAINS DATE OF NEXT VISIT: NONE  Request for Surgical Clearance    Procedure:   CYSTOSCOPY BLADDER Bx  Date of Surgery:  Clearance 11/05/23                                 Surgeon:  DR. DAHLSTEDT Surgeon's Group or Practice Name:  [facility] Phone number:  [phone] Fax number:  [phone]   Type of Clearance Requested:   - Medical  - Pharmacy:  Hold Apixaban (Eliquis) x 2 DAYS PRIOR  AND 3 DAYS POST PROCEDURE   Type of Anesthesia:  General    Additional requests/questions:    Elpidio Anis   10/30/2023, 5:07 PM

## 2023-10-30 NOTE — Telephone Encounter (Signed)
   Name: Kyle Owen  DOB: [birth year]  MRN: [id number]  Primary Cardiologist: Donato Schultz, MD   Preoperative team, please contact this patient and set up a phone call appointment for further preoperative risk assessment. Please obtain consent and complete medication review. Thank you for your help.  I confirm that guidance regarding antiplatelet and oral anticoagulation therapy has been completed and, if necessary, noted below.  Request for Eliquis recommendations sent to pharmacy.   I also confirmed the patient resides in the state of [place]. As per [facility] Medical Board telemedicine laws, the patient must reside in the state in which the provider is licensed.   Carlos Levering, NP 10/30/2023, 5:16 PM [facility]

## 2023-10-30 NOTE — Progress Notes (Addendum)
Anesthesia Chart Review   Case: [id number] Date/Time: 10/31/23 0815   Procedures:      CYSTOSCOPY WITH RETROGRADE PYELOGRAM (Bilateral)     CYSTOSCOPY WITH BIOPSY- bladder biopsy   Anesthesia type: General   Pre-op diagnosis: bladder cancer   Location: [facility]   Surgeons: Marcine Matar, MD       DISCUSSION:82 y.o. former smoker with h/o HTN, COPD on home O2, PAF, CKD Stage III, bladder cancer scheduled for above procedure 10/31/2023 with Dr. Marcine Matar.   Per cardiology, Dr. Donato Schultz, note 11/21/2022, "He saw Dr. Maren Beach 05/15/2022 His aortic aneurysm is stable at 4.7 cm, but due to his history of bladder cancer and age he is not a good candidate for surgery. He will be getting a recheck in 18 months."   CT Angio 10/24/2022  Stable uncomplicated fusiform aneurysmal dilatation of the ascending thoracic aorta and proximal descending thoracic aorta measuring 48 and 43 mm in diameter respectively, grossly unchanged compared to the 11/2015 examination by my direct remeasurement.   Pt last seen by cardiology 04/09/2023. Per OV note, "Mr. Tuley's perioperative risk of a major cardiac event is 0.9% according to the Revised Cardiac Risk Index (RCRI).  Therefore, he is at low risk for perioperative complications.   His functional capacity is good at 5.62 METs according to the Duke Activity Status Index (DASI). Recommendations: According to ACC/AHA guidelines, no further cardiovascular testing needed.  The patient may proceed to surgery at acceptable risk.   Antiplatelet and/or Anticoagulation Recommendations:   Eliquis (Apixaban) can be held for 1-2 days prior to surgery.  Please resume post op when felt to be safe."  Pt admitted 10/25-10/27/2024 with COPD exacerbation. Given steroids, bronchodilators, doxycycline, discharged on supplemental O2. He has not been seen by PCP or pulmonology since admission.  Pt should be two weeks asymptomatic from this recent  exacerbation and will need pulmonology clearance.  Secure chat sent to [place], VM left for [place] at the [place] office.   Addendum 11/08/2023:  Pt seen by pulmonology 10/31/2023. Per OV note, "no obvious contraindication to cystoscopy from a pulmonary perspective."  VS: There were no vitals taken for this visit.  PROVIDERS: Carylon Perches, MD is PCP   Donato Schultz, MD is Cardiologist  LABS: Labs reviewed: Acceptable for surgery. (all labs ordered are listed, but only abnormal results are displayed)  Labs Reviewed - No data to display   IMAGES:   EKG:   CV: Myocardial Perfusion 05/29/2017 Nuclear stress EF: 66%. There was no ST segment deviation noted during stress. The study is normal. This is a low risk study. The left ventricular ejection fraction is hyperdynamic (>65%).   Thinning of the inferior wall at mid and basal level thought to be from extra cardiac /diaphragmatic attenuation No ischemia and no RWMA;s EF 66%    Echo 07/27/2015 - Left ventricle: Systolic function was normal. The estimated    ejection fraction was in the range of 55% to 60%. Wall motion was    normal; there were no regional wall motion abnormalities.  - Aortic valve: No evidence of vegetation. There was mild    regurgitation.  - Ascending aorta: The ascending aorta was mildly dilated.  - Mitral valve: No evidence of vegetation. There was mild    regurgitation.  - Left atrium: No evidence of thrombus in the atrial cavity or    appendage.  - Right atrium: No evidence of thrombus in the atrial cavity or  appendage.  - Atrial septum: No defect or patent foramen ovale was identified.  - Tricuspid valve: No evidence of vegetation. There was    mild-moderate regurgitation.  - Pulmonic valve: No evidence of vegetation.  Past Medical History:  Diagnosis Date   A-fib (HCC)    Anticoagulant long-term use    eliquis--- managed by cardiology   Benign prostatic hypertrophy with urinary  frequency    Bladder cancer (HCC)    Chronic respiratory failure with hypoxia (HCC)    Complication of anesthesia    gets combative in recovery   COPD with chronic bronchitis and emphysema (HCC)    pulmonologist--- dr Vassie Loll;  nocturnal oxygen,  no daytime oxygen use   Dependence on nocturnal oxygen therapy    Depression    Dyspnea    when walking   History of colon polyps    History of kidney stones    History of loop recorder    loop recorder in place battery is dead has not had changed due to covid   History of TIA (transient ischemic attack)    (per pt no residual ) per neurologist note (dr Marjory Lies 11-08-2015) dx cryptogenic TIA versus arrhythia versus dysautonomia (right ophthalmic artery TIA and x3 posterior circulation TIAs   Hyperlipidemia    Hypertension    Pt denies   Malignant neoplasm of overlapping sites of bladder [facility])    urologist--- dr Retta Diones   Multinodular thyroid    per pathology report 11-12-2014 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    Nephrolithiasis    OA (osteoarthritis)    DJD right knee   Ocular migraine    controlled w/ verapamil   On home oxygen therapy    PAF (paroxysmal atrial fibrillation) (HCC)    followed by AFIB clinic-- cardiologist-- dr Anne Fu   Pneumonia    2023   Pulmonary nodule, left    left lower lobe x2 per CT 01-20-2016   Renal cyst, left    Stroke [facility])    Thoracic aortic aneurysm without rupture [facility])    followed by dr Zenaida Niece tright ;   ascending -- ct chest 01-24-2023   4.8cm   Wears hearing aid in both ears    wear at times    Past Surgical History:  Procedure Laterality Date   ANTERIOR CERVICAL DECOMP/DISCECTOMY FUSION N/A 04/03/2017   Procedure: Cervical three-four, Cervical four-five Anterior cervical decompression/discectomy/fusion;  Surgeon: Maeola Harman, MD;  Location: [facility] OR;  Service: Neurosurgery;  Laterality: N/A;   CATARACT EXTRACTION W/ INTRAOCULAR LENS IMPLANT Right 2016    CATARACT EXTRACTION W/PHACO  12/16/2012   Procedure: CATARACT EXTRACTION PHACO AND INTRAOCULAR LENS PLACEMENT (IOC);  Surgeon: Gemma Payor, MD;  Location: [facility];  Service: Ophthalmology;  Laterality: Left;  CDE:17.31   COLONOSCOPY  10/03/2011   Procedure: COLONOSCOPY;  Surgeon: Dalia Heading;  Location: [facility];  Service: Gastroenterology;  Laterality: N/A;   CYSTOSCOPY W/ RETROGRADES Bilateral 04/12/2023   Procedure: CYSTOSCOPY WITH RETROGRADE PYELOGRAM;  Surgeon: Marcine Matar, MD;  Location: [facility];  Service: Urology;  Laterality: Bilateral;   CYSTOSCOPY WITH BIOPSY N/A 04/12/2023   Procedure: CYSTOSCOPY WITH BLADDER BIOPSY;  Surgeon: Marcine Matar, MD;  [facility];  Service: Urology;  Laterality: N/A;  45 MINS   CYSTOSCOPY/RETROGRADE/URETEROSCOPY/STONE EXTRACTION WITH BASKET Right 03/29/2020   Procedure: CYSTOSCOPY/RETROGRADE/URETEROSCOPY/STONE EXTRACTION WITH BASKET;  Surgeon: Marcine Matar, MD;  [facility];  Service: Urology;  Laterality: Right;  1 HR   DECOMPRESSIOIN ULNAR NERVE AND  CUBITAL TUNNEL RELEASE Left 07/14/2009   elbow   EP IMPLANTABLE DEVICE N/A 08/10/2015   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   HOLMIUM LASER APPLICATION Right 03/29/2020   Procedure: HOLMIUM LASER APPLICATION;  Surgeon: Marcine Matar, MD;  [facility];  Service: Urology;  Laterality: Right;   INGUINAL HERNIA REPAIR Right 1990's   KNEE ARTHROSCOPY Right 2015   POSTERIOR LUMBAR FUSION     2014   L3--4;    12/ 2018   L4--5   SHOULDER ARTHROSCOPY Left 1990's   TEE WITHOUT CARDIOVERSION N/A 07/27/2015   Procedure: TRANSESOPHAGEAL ECHOCARDIOGRAM (TEE);  Surgeon: Lewayne Bunting, MD;  Location: [facility] ENDOSCOPY;  Service: Cardiovascular;  Laterality: N/A;   normal LV function, ef 55-60%,  mild AR and MR, mild dilated ascending aorta (4.2cm),  mild to moderate atherosclerosis  descending aorta,  mild to moderate TR,  negative saline microcavitation study    THYROIDECTOMY Right 01/20/2015   Procedure: RIGHT THYROIDECTOMY;  Surgeon: Darletta Moll, MD;  Location: [facility] OR;  Service: ENT;  Laterality: Right;   TONSILLECTOMY  as child   TOTAL HIP ARTHROPLASTY Left 12/14/2020   Procedure: TOTAL HIP ARTHROPLASTY ANTERIOR APPROACH;  Surgeon: Sheral Apley, MD;  [facility];  Service: Orthopedics;  Laterality: Left;   TRANSURETHRAL RESECTION OF BLADDER TUMOR N/A 05/15/2016   Procedure: TRANSURETHRAL RESECTION OF BLADDER TUMOR (TURBT) AND INSTILLATION OF EPIRUBICIN;  Surgeon: Marcine Matar, MD;  Location[facility];  Service: Urology;  Laterality: N/A;   TRANSURETHRAL RESECTION OF BLADDER TUMOR N/A 03/29/2020   Procedure: TRANSURETHRAL RESECTION OF BLADDER TUMOR (TURBT);  Surgeon: Marcine Matar, MD;  [facility];  Service: Urology;  Laterality: N/A;   TRANSURETHRAL RESECTION OF BLADDER TUMOR WITH MITOMYCIN-C N/A 01/23/2022   Procedure: TRANSURETHRAL RESECTION OF BLADDER TUMOR WITH POST OPERATIVE GEMCITABINE INSTILLATION;  Surgeon: Marcine Matar, MD;  [facility];  Service: Urology;  Laterality: N/A;    MEDICATIONS:  acetaminophen (TYLENOL) 500 MG tablet   atorvastatin (LIPITOR) 20 MG tablet   budesonide-formoterol (SYMBICORT) 160-4.5 MCG/ACT inhaler   cetirizine (ZYRTEC) 10 MG tablet   Cholecalciferol (VITAMIN D) 50 MCG (2000 UT) tablet   ELIQUIS 5 MG TABS tablet   finasteride (PROSCAR) 5 MG tablet   ipratropium-albuterol (DUONEB) 0.5-2.5 (3) MG/3ML SOLN   Magnesium 250 MG TABS   OXYGEN   sertraline (ZOLOFT) 50 MG tablet   VENTOLIN HFA 108 (90 BASE) MCG/ACT inhaler   verapamil (CALAN-SR) 240 MG CR tablet   No current facility-administered medications for this encounter.    Jodell Cipro Ward, PA-C [facility] [phone]

## 2023-10-30 NOTE — Telephone Encounter (Signed)
Please advise holding Eliquis prior to cysto with bladder biopsy.  Thank you!  DW

## 2023-10-31 ENCOUNTER — Telehealth: Payer: Self-pay | Admitting: Pulmonary Disease

## 2023-10-31 ENCOUNTER — Encounter: Payer: Self-pay | Admitting: Internal Medicine

## 2023-10-31 ENCOUNTER — Ambulatory Visit: Payer: Medicare Other | Admitting: Internal Medicine

## 2023-10-31 VITALS — BP 127/74 | HR 66 | Ht 73.0 in | Wt 189.0 lb

## 2023-10-31 DIAGNOSIS — J9611 Chronic respiratory failure with hypoxia: Secondary | ICD-10-CM

## 2023-10-31 DIAGNOSIS — J449 Chronic obstructive pulmonary disease, unspecified: Secondary | ICD-10-CM | POA: Diagnosis not present

## 2023-10-31 MED ORDER — BREZTRI AEROSPHERE 160-9-4.8 MCG/ACT IN AERO: 2.0000 | INHALATION_SPRAY | Freq: Two times a day (BID) | RESPIRATORY_TRACT | Status: DC

## 2023-10-31 NOTE — Telephone Encounter (Signed)
Per preop APP Robin Searing, NP to reach out to the surgeon's office and update: He may need to reschedule because of the additional feedback required by Dr. Anne Fu.   I have left a message for the requesting office that we are needing further recommendations from Dr. Anne Fu.   I will fax notes as an FYI.

## 2023-10-31 NOTE — Telephone Encounter (Addendum)
   Patient Name: Kyle Owen  DOB: Jun 25, 1941 MRN: [id number]  Primary Cardiologist: Donato Schultz, MD  Chart reviewed as part of pre-operative protocol coverage. Given past medical history and time since last visit, based on ACC/AHA guidelines, Kyle Owen is at acceptable risk for the planned procedure without further cardiovascular testing.   The patient was advised that if he develops new symptoms prior to surgery to contact our office to arrange for a follow-up visit, and he verbalized understanding.  Patient can hold Eliquis 2 days prior and 3 days post procedure per Dr. Anne Fu.  I will route this recommendation to the requesting party via Epic fax function and remove from pre-op pool.  Please call with questions.  Napoleon Form, Leodis Rains, NP 10/31/2023, 4:33 PM

## 2023-10-31 NOTE — Patient Instructions (Addendum)
Plan A = Automatic = Always=    Symbicort (breztri) Take 2 puffs first thing in am and then another 2 puffs about 12 hours later.    Work on inhaler technique:  relax and gently blow all the way out then take a nice smooth full deep breath back in, triggering the inhaler at same time you start breathing in.  Hold breath in for at least  5 seconds if you can. Blow out symbicort/breztri  thru nose. Rinse and gargle with water when done.  If mouth or throat bother you at all,  try brushing teeth/gums/tongue with arm and hammer toothpaste/ make a slurry and gargle and spit out.      Plan B = Backup (to supplement plan A, not to replace it) Only use your albuterol inhaler as a rescue medication to be used if you can't catch your breath by resting or doing a relaxed purse lip breathing pattern.  - The less you use it, the better it will work when you need it. - Ok to use the inhaler up to 2 puffs  every 4 hours if you must but call for appointment if use goes up over your usual need - Don't leave home without it !!  (think of it like starter fluid for an engine)   Plan C = Crisis (instead of Plan B but only if Plan B stops working) - only use your albuterol-ipatroium  nebulizer if you first try Plan B and it fails to help > ok to use the nebulizer up to every 4 hours but if start needing it regularly call for immediate appointment  Also  Ok to try albuterol  15 min before an activity (on alternating days)  that you know would usually make you short of breath and see if it makes any difference and if makes none then don't take albuterol after activity unless you can't catch your breath as this means it's the resting that helps, not the albuterol.  Make sure you check your oxygen saturation  AT  your highest level of activity (not after you stop)   to be sure it stays over 90% and adjust  02 flow upward to maintain this level if needed but remember to turn it back to previous settings when you stop (to  conserve your supply).     I will see if  I can approve him from surgery after I review his record.

## 2023-10-31 NOTE — Telephone Encounter (Signed)
Fax received from Dr. Retta Diones with [facility] [place] to perform a bladder biopsy procedure using general anesthesia on patient.  Patient needs surgery clearance. Surgery is 11/05/23. Patient was seen on 12/05/22. Office protocol is a risk assessment can be sent to surgeon if patient has been seen in 60 days or less.   Needs appt- spoke with the pt's spouse and scheduled appt with Dr. Sherene Sires in [place] today to be cleared. Will hold until ov signed.

## 2023-10-31 NOTE — Telephone Encounter (Signed)
I s/w the pt's wife DPR and have gone over the instructions for holding Eliquis. Mrs. Saccente has verbalized understanding of plan of care.   I will update the surgeon's office .

## 2023-10-31 NOTE — Assessment & Plan Note (Signed)
Placed on noct 02 by Vassie Loll around 2020  - placed on 2lpm POC at d/c from aecopd p  admit  10/19/23  - 10/31/2023   Walked on 2lpm POC   x  2  lap(s) =  approx 300  ft  @ slow/cane  pace, stopped due to fatigue/ unsteady on feet  >> sob  with lowest 02 sats 93%    Advised for now :  2lpm hs Make sure you check your oxygen saturation  AT  your highest level of activity (not after you stop)   to be sure it stays over 90% and adjust  02 flow upward to maintain this level if needed but remember to turn it back to previous settings when you stop (to conserve your supply).

## 2023-10-31 NOTE — Telephone Encounter (Signed)
Patient with diagnosis of A fib on Eliquis for anticoagulation.  Of note, patient's Eliquis dose should be 2.5mg  BID  Procedure: CYSTOSCOPY BLADDER Bx   Date of procedure: 11/05/23   CHA2DS2-VASc Score = 5  This indicates a 7.2% annual risk of stroke. The patient's score is based upon: CHF History: 0 HTN History: 1 Diabetes History: 0 Stroke History: 2 Vascular Disease History: 0 Age Score: 2 Gender Score: 0   CrCl 42 mL/min Platelet count 133K  Patient with history of stroke. Will need input from Dr Anne Fu for a 5 day Eliquis hold. May need Lovenox bridge.   **This guidance is not considered finalized until pre-operative APP has relayed final recommendations.**

## 2023-10-31 NOTE — Progress Notes (Signed)
Kyle Owen, male    DOB: 09/04/1941   MRN: [id number]   Brief patient profile:  32 yowm  quit smoking 09/2010  self referred to pulmonary clinic 10/31/2023 for preop  for cystoscopy at [facility]   On noct 02 2lpm hs x around 2020    Admit date:     10/19/2023  Discharge date: 10/21/23      Recommendations at discharge:     Patient will continue LABA/ ICS inhaler therapy. Added as needed duoneb. Added supplemental 02 on ambulation. Follow up with Dr. Ouida Sills in 7 to 10 days.    Discharge Diagnoses: Principal Problem:   COPD exacerbation (HCC)   History of stroke   Paroxysmal atrial fibrillation (HCC)   Chronic kidney disease, stage 3b (HCC)   Thoracic ascending aortic aneurysm (HCC)   Bladder cancer (HCC)   Resolved Problems:   * No resolved hospital problems. [facility] Course: Mr. Ickes was admitted to the hospital with the working diagnosis of COPD exacerbation    82 yo male with the past medical history of COPD, chronic hypoxia, atrial fibrillation, and bladder cancer who presented with dyspnea.  Patient with worsening symptoms , he was evaluated at urgent care and was found hypoxemic, down to 82% on room air. In the ED his blood pressure was 121/61, HR 69, RR 24 and 02 saturation 96% on supplemental 02 per [place], lungs with bilateral wheezing, heart with S1 and S2 present and regular, abdomen with no distention and no lower extremity edema.    Na 139, K 4,0 Cl 102 bicarbonate 27, glucose 118, bun 31 cr 1,68  BNP 239  Lactic acid 1,4 and 1,3 Wbc 10.7 hgb 13.1 plt 152  Sars covid 19 and influenza negative.   Chest radiograph with hyperinflation, positive cardiomegaly with no infiltrates or effusions.    EKG 70 bpm, normal axis, normal intervals, atrial fibrillation rhythm with no significant ST segment or T wave changes.    Patient was placed on supplemental 02 per [place], systemic corticosteroids and bronchodilator therapy.   10/27 symptoms have improved, oxygenation on  room air while at rest 90% and on ambulation 87%,  Patient will get a portable supplemental 02 at the time of discharge.      Assessment and Plan: * COPD exacerbation (HCC) Patient was placed on medical therapy with systemic and inhaled corticosteroids. Bronchodilator therapy and oral doxycycline.    His symptoms improved, and plan is to continue bronchodilator therapy and inhaled corticosteroids (LABA/ ICS) at home.  Added nebulizer machine to use Duoneb.  Supplemental 02 for ambulation.  Follow up with primary care in 7 to 10 days.    History of stroke No acute neurologic deficits.    Dyslipidemia, continue with statin.     Paroxysmal atrial fibrillation (HCC) Continue rate control with verapamil and anticoagulation with apixaban.    Chronic kidney disease, stage 3b (HCC) Renal function stable, continue blood pressure monitoring.    Thoracic ascending aortic aneurysm (HCC) Continue blood control.    Bladder cancer (HCC) Follow up as outpatient.  Old records personally reviewed, office visit to Dr Retta Diones, noted recurrent CIS of the bladder. Status recent biopsy, noted not a large area of recurrence. Upper tracts with normal retrograde studies.  Getting BCG therapy.           History of Present Illness  10/31/2023  Pulmonary/ 1st office eval/[name] with GOLD 2 copd/ AB new rx amb 02 / new nebulizer duob since last admit  symbiocort  Chief Complaint  Patient presents with   Cough   Shortness of Breath   Surgical Clearance  Dyspnea:  walks dog 10-15 min does not need to stop due to breahting  Cough: min mucoid  Sleep: sleeps in recliner x 45 degrees most of the time SABA use:  hfa / neb rarely needing either  02   2lpm POC   No obvious day to day or daytime pattern/variability or assoc excess/ purulent sputum or mucus plugs or hemoptysis or cp or chest tightness, subjective wheeze or overt sinus or hb symptoms.    Also denies any obvious fluctuation of symptoms with  weather or environmental changes or other aggravating or alleviating factors except as outlined above   No unusual exposure hx or h/o childhood pna/ asthma or knowledge of premature birth.  Current Allergies, Complete Past Medical History, Past Surgical History, Family History, and Social History were reviewed in [employer] record.  ROS  The following are not active complaints unless bolded Hoarseness, sore throat, dysphagia, dental problems, itching, sneezing,  nasal congestion or discharge of excess mucus or purulent secretions, ear ache,   fever, chills, sweats, unintended wt loss or wt gain, classically pleuritic or exertional cp,  orthopnea pnd or arm/hand swelling  or leg swelling, presyncope, palpitations, abdominal pain, anorexia, nausea, vomiting, diarrhea  or change in bowel habits or change in bladder habits, change in stools or change in urine, dysuria, hematuria,  rash, arthralgias, visual complaints, headache, numbness, weakness or ataxia or problems with walking or coordination,  change in mood or  memory. Hearing deficit             Outpatient Medications Prior to Visit  Medication Sig Dispense Refill   acetaminophen (TYLENOL) 500 MG tablet Take 500 mg by mouth in the morning and at bedtime.     atorvastatin (LIPITOR) 20 MG tablet TAKE 1 TABLET BY MOUTH DAILY. 90 tablet 2   budesonide-formoterol (SYMBICORT) 160-4.5 MCG/ACT inhaler INHALE 2 PUFFS INTO THE LUNGS TWICE DAILY. 10.2 g 0   cetirizine (ZYRTEC) 10 MG tablet Take 10 mg by mouth daily.     Cholecalciferol (VITAMIN D) 50 MCG (2000 UT) tablet Take 2,000 Units by mouth in the morning.     ELIQUIS 5 MG TABS tablet Take 5 mg by mouth 2 (two) times daily.     finasteride (PROSCAR) 5 MG tablet Take 5 mg by mouth in the morning.     ipratropium-albuterol (DUONEB) 0.5-2.5 (3) MG/3ML SOLN Take 3 mLs by nebulization every 6 (six) hours as needed (as needed for shortness of breath or wheezing). 360 mL 0    Magnesium 250 MG TABS Take 250 mg by mouth in the morning and at bedtime.     OXYGEN Inhale 2.5 L into the lungs at bedtime.     sertraline (ZOLOFT) 50 MG tablet Take 50 mg by mouth every morning.     VENTOLIN HFA 108 (90 BASE) MCG/ACT inhaler Inhale 2 puffs into the lungs Every 4 hours as needed for wheezing or shortness of breath.      verapamil (CALAN-SR) 240 MG CR tablet Take 240 mg by mouth in the morning.     No facility-administered medications prior to visit.    Past Medical History:  Diagnosis Date   A-fib [facility])    Anticoagulant long-term use    eliquis--- managed by cardiology   Benign prostatic hypertrophy with urinary frequency    Bladder cancer (HCC)    Chronic respiratory  failure with hypoxia (HCC)    Complication of anesthesia    gets combative in recovery   COPD with chronic bronchitis and emphysema (HCC)    pulmonologist--- dr Vassie Loll;  nocturnal oxygen,  no daytime oxygen use   Dependence on nocturnal oxygen therapy    Depression    Dyspnea    when walking   History of colon polyps    History of kidney stones    History of loop recorder    loop recorder in place battery is dead has not had changed due to covid   History of TIA (transient ischemic attack)    (per pt no residual ) per neurologist note (dr Marjory Lies 11-08-2015) dx cryptogenic TIA versus arrhythia versus dysautonomia (right ophthalmic artery TIA and x3 posterior circulation TIAs   Hyperlipidemia    Hypertension    Pt denies   Malignant neoplasm of overlapping sites of bladder [facility])    urologist--- dr Retta Diones   Multinodular thyroid    per pathology report 11-12-2014 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    Nephrolithiasis    OA (osteoarthritis)    DJD right knee   Ocular migraine    controlled w/ verapamil   On home oxygen therapy    PAF (paroxysmal atrial fibrillation) (HCC)    followed by AFIB clinic-- cardiologist-- dr Anne Fu   Pneumonia    2023    Pulmonary nodule, left    left lower lobe x2 per CT 01-20-2016   Renal cyst, left    Stroke [facility])    Thoracic aortic aneurysm without rupture [facility])    followed by dr Morton Peters ;   ascending -- ct chest 01-24-2023   4.8cm   Wears hearing aid in both ears    wear at times      Objective:     BP 127/74   Pulse 66   Ht 6\' 1"  (1.854 m)   Wt 189 lb (85.7 kg)   SpO2 91% Comment: 2L POC  BMI 24.94 kg/m   SpO2: 91 % (2L POC)  amb frail elderly wm / very hard of hearing         HEENT : Oropharynx  clear       NECK :  without  apparent JVD/ palpable Nodes/TM    LUNGS: no acc muscle use,  Min barrel  contour chest wall with bilateral  slightly decreased bs with a few insp crackles in bases  and  without cough on insp or exp maneuvers and min  Hyperresonant  to  percussion bilaterally    CV:  RRR  no s3 or murmur or increase in P2, and no edema   ABD:  soft and nontender with pos end  insp Hoover's  in the supine position.  No bruits or organomegaly appreciated   MS:  Nl gait/ ext warm without deformities Or obvious joint restrictions  calf tenderness, cyanosis or clubbing     SKIN: warm and dry without lesions    NEURO:  alert, approp, nl sensorium with  no motor or cerebellar deficits apparent.            I personally reviewed images and agree with radiology impression as follows:  CXR:   portable 10/19/23  Stable bibasilar interstitial densities are noted most consistent with scarring or possibly atelectasis, left greater than right.   Assessment   COPD GOLD 2 AB Quit smoking in 2011  - PFT's  11/09/2020  FEV1 2.01 (64 % ) ratio  0.63  p 9 % improvement from saba p symbicort 160 prior to study with DLCO  22.8 (88%)   and FV curve min concave  - 10/31/2023  After extensive coaching inhaler device,  effectiveness =    75% from a baseline nearly 0 > continue symbiocrt 160 (breztri sample as trainer today)  and approp saba: Re SABA :  I spent extra time with pt today  reviewing appropriate use of albuterol for prn use on exertion with the following points: 1) saba is for relief of sob that does not improve by walking a slower pace or resting but rather if the pt does not improve after trying this first. 2) If the pt is convinced, as many are, that saba helps recover from activity faster then it's easy to tell if this is the case by re-challenging : ie stop, take the inhaler, then p 5 minutes try the exact same activity (intensity of workload) that just caused the symptoms and see if they are substantially diminished or not after saba 3) if there is an activity that reproducibly causes the symptoms, try the saba 15 min before the activity on alternate days   If in fact the saba really does help, then fine to continue to use it prn but advised may need to look closer at the maintenance regimen (for now symbicort 160)  being used to achieve better control of airways disease with exertion.   He has a ct chest for 11/02/23 I would like to see but no obvious contraindication to cystoscopy from a pulmonary perspective  F/u with Dr Alva/ here prn   Each maintenance medication was reviewed in detail including emphasizing most importantly the difference between maintenance and prns and under what circumstances the prns are to be triggered using an action plan format where appropriate.  Total time for H and P, chart review, counseling, reviewing hfa device(s) , directly observing portions of ambulatory 02 saturation study/ and generating customized AVS unique to this office visit / same day charting > 40 min for preop eval with pt new to me                Chronic respiratory failure with hypoxia (HCC) Placed on noct 02 by Alva around 2020  - placed on 2lpm POC at d/c from aecopd p  admit  10/19/23  - 10/31/2023   Walked on 2lpm POC   x  2  lap(s) =  approx 300  ft  @ slow/cane  pace, stopped due to fatigue/ unsteady on feet  >> sob  with lowest 02 sats 93%    Advised  for now :  2lpm hs Make sure you check your oxygen saturation  AT  your highest level of activity (not after you stop)   to be sure it stays over 90% and adjust  02 flow upward to maintain this level if needed but remember to turn it back to previous settings when you stop (to conserve your supply).      Sandrea Hughs, MD 10/31/2023

## 2023-10-31 NOTE — Assessment & Plan Note (Addendum)
Quit smoking in 2011  - PFT's  11/09/2020  FEV1 2.01 (64 % ) ratio 0.63  p 9 % improvement from saba p symbicort 160 prior to study with DLCO  22.8 (88%)   and FV curve min concave  - 10/31/2023  After extensive coaching inhaler device,  effectiveness =    75% from a baseline nearly 0 > continue symbiocrt 160 (breztri sample as trainer today)  and approp saba: Re SABA :  I spent extra time with pt today reviewing appropriate use of albuterol for prn use on exertion with the following points: 1) saba is for relief of sob that does not improve by walking a slower pace or resting but rather if the pt does not improve after trying this first. 2) If the pt is convinced, as many are, that saba helps recover from activity faster then it's easy to tell if this is the case by re-challenging : ie stop, take the inhaler, then p 5 minutes try the exact same activity (intensity of workload) that just caused the symptoms and see if they are substantially diminished or not after saba 3) if there is an activity that reproducibly causes the symptoms, try the saba 15 min before the activity on alternate days   If in fact the saba really does help, then fine to continue to use it prn but advised may need to look closer at the maintenance regimen (for now symbicort 160)  being used to achieve better control of airways disease with exertion.   He has a ct chest for 11/02/23 I would like to see but no obvious contraindication to cystoscopy from a pulmonary perspective  F/u with Dr Alva/ here prn   Each maintenance medication was reviewed in detail including emphasizing most importantly the difference between maintenance and prns and under what circumstances the prns are to be triggered using an action plan format where appropriate.  Total time for H and P, chart review, counseling, reviewing hfa device(s) , directly observing portions of ambulatory 02 saturation study/ and generating customized AVS unique to this office  visit / same day charting > 40 min for preop eval with pt new to me

## 2023-11-02 ENCOUNTER — Ambulatory Visit
Admission: RE | Admit: 2023-11-02 | Discharge: 2023-11-02 | Disposition: A | Discharge status: Home / Self Care | Payer: Medicare Other | Source: Ambulatory Visit | Attending: Cardiothoracic Surgery | Admitting: Cardiothoracic Surgery

## 2023-11-02 DIAGNOSIS — I7121 Aneurysm of the ascending aorta, without rupture: Secondary | ICD-10-CM

## 2023-11-05 NOTE — Telephone Encounter (Signed)
I have faxed the note from 10/31/23 to urology

## 2023-11-05 NOTE — Progress Notes (Signed)
Reviewed pt's chart for pre-op interview for surgery posted @[facility]  on 11-12-2023 by Dr Alma Friendly.  Noted patients' pulmonology clearance note in epic by dr Sherene Sires on 10-31-2023  is on continuous supplement oxygen.  Per anesthesia guidelines for ambulatory surgery center patient is not a candidate for [facility].  Sent inbox message to OR scheduler, Romero Liner, and Dr Retta Diones informed patient will need to be moved to main OR.

## 2023-11-07 ENCOUNTER — Encounter (HOSPITAL_COMMUNITY): Payer: Self-pay | Admitting: Urology

## 2023-11-07 ENCOUNTER — Other Ambulatory Visit: Payer: Self-pay

## 2023-11-08 ENCOUNTER — Other Ambulatory Visit: Payer: Self-pay | Admitting: Pulmonary Disease

## 2023-11-08 ENCOUNTER — Telehealth: Payer: Self-pay

## 2023-11-08 NOTE — Telephone Encounter (Signed)
Patient surgery date moved to 11/18 do you want to move post op out further.  At the moment his post op is for 11/26, and I don't think you have many openings for a while

## 2023-11-08 NOTE — Anesthesia Preprocedure Evaluation (Addendum)
Anesthesia Evaluation  Patient identified by MRN, date of birth, ID band Patient awake    Reviewed: Allergy & Precautions, NPO status , [name] Chart, lab work & pertinent test results  Airway Mallampati: II  TM Distance: >3 FB Neck ROM: Full    Dental  (+) Teeth Intact, Caps   Pulmonary COPD, former smoker   breath sounds clear to auscultation       Cardiovascular hypertension, + dysrhythmias Atrial Fibrillation + pacemaker  Rhythm:Regular Rate:Normal     Neuro/Psych  Headaches PSYCHIATRIC DISORDERS Anxiety Depression    CVA    GI/Hepatic negative GI ROS, Neg liver ROS,,,  Endo/Other  negative endocrine ROS    Renal/GU Renal disease     Musculoskeletal  (+) Arthritis ,    Abdominal   Peds  Hematology negative hematology ROS (+)   Anesthesia Other Findings   Reproductive/Obstetrics                             Anesthesia Physical Anesthesia Plan  ASA: 3  Anesthesia Plan: General   Post-op Pain Management: Tylenol PO (pre-op)*   Induction: Intravenous  PONV Risk Score and Plan: 3 and Ondansetron, Dexamethasone and Treatment may vary due to age or medical condition  Airway Management Planned: LMA  Additional Equipment: None  Intra-op Plan:   Post-operative Plan: Extubation in OR  Informed Consent: I have reviewed the patients History and Physical, chart, labs and discussed the procedure including the risks, benefits and alternatives for the proposed anesthesia with the patient or authorized representative who has indicated his/her understanding and acceptance.     Dental advisory given  Plan Discussed with: CRNA  Anesthesia Plan Comments: (See PAT note 10/29/2023, Christeen Douglas, PA-C)       Anesthesia Quick Evaluation

## 2023-11-12 ENCOUNTER — Ambulatory Visit (HOSPITAL_COMMUNITY): Payer: Medicare Other | Admitting: Physician Assistant

## 2023-11-12 ENCOUNTER — Ambulatory Visit (HOSPITAL_COMMUNITY)
Admission: RE | Admit: 2023-11-12 | Discharge: 2023-11-12 | Disposition: A | Discharge status: Home / Self Care | Payer: Medicare Other | Source: Ambulatory Visit | Attending: Urology | Admitting: Urology

## 2023-11-12 ENCOUNTER — Other Ambulatory Visit: Payer: Self-pay

## 2023-11-12 ENCOUNTER — Ambulatory Visit: Payer: Medicare Other | Admitting: Cardiothoracic Surgery

## 2023-11-12 ENCOUNTER — Encounter (HOSPITAL_COMMUNITY): Payer: Self-pay | Admitting: Urology

## 2023-11-12 ENCOUNTER — Ambulatory Visit (HOSPITAL_COMMUNITY): Payer: Medicare Other

## 2023-11-12 ENCOUNTER — Encounter (HOSPITAL_COMMUNITY)
Admission: RE | Disposition: A | Discharge status: Home / Self Care | Payer: Self-pay | Source: Ambulatory Visit | Attending: Urology

## 2023-11-12 DIAGNOSIS — N3080 Other cystitis without hematuria: Secondary | ICD-10-CM

## 2023-11-12 DIAGNOSIS — Z8551 Personal history of malignant neoplasm of bladder: Secondary | ICD-10-CM | POA: Insufficient documentation

## 2023-11-12 DIAGNOSIS — N3289 Other specified disorders of bladder: Secondary | ICD-10-CM | POA: Diagnosis not present

## 2023-11-12 DIAGNOSIS — N3020 Other chronic cystitis without hematuria: Secondary | ICD-10-CM | POA: Diagnosis not present

## 2023-11-12 DIAGNOSIS — R896 Abnormal cytological findings in specimens from other organs, systems and tissues: Secondary | ICD-10-CM | POA: Insufficient documentation

## 2023-11-12 DIAGNOSIS — C678 Malignant neoplasm of overlapping sites of bladder: Secondary | ICD-10-CM

## 2023-11-12 DIAGNOSIS — C679 Malignant neoplasm of bladder, unspecified: Secondary | ICD-10-CM

## 2023-11-12 HISTORY — PX: CYSTOSCOPY WITH BIOPSY: SHX5122

## 2023-11-12 HISTORY — PX: CYSTOSCOPY W/ RETROGRADES: SHX1426

## 2023-11-12 HISTORY — PX: URETEROSCOPY: SHX842

## 2023-11-12 SURGERY — CYSTOSCOPY, WITH RETROGRADE PYELOGRAM
Anesthesia: General

## 2023-11-12 MED ORDER — LIDOCAINE HCL (PF) 2 % IJ SOLN
INTRAMUSCULAR | Status: AC
  Filled 2023-11-12: qty 5

## 2023-11-12 MED ORDER — CHLORHEXIDINE GLUCONATE 0.12 % MT SOLN
15.0000 mL | Freq: Once | OROMUCOSAL | Status: AC
  Administered 2023-11-12: 15 mL via OROMUCOSAL

## 2023-11-12 MED ORDER — ONDANSETRON HCL 4 MG/2ML IJ SOLN
INTRAMUSCULAR | Status: AC
  Filled 2023-11-12: qty 2

## 2023-11-12 MED ORDER — DEXMEDETOMIDINE HCL IN NACL 80 MCG/20ML IV SOLN
INTRAVENOUS | Status: DC | PRN
  Administered 2023-11-12 (×2): 4 ug via INTRAVENOUS

## 2023-11-12 MED ORDER — FENTANYL CITRATE PF 50 MCG/ML IJ SOSY: 25.0000 ug | PREFILLED_SYRINGE | INTRAMUSCULAR | Status: DC | PRN

## 2023-11-12 MED ORDER — DROPERIDOL 2.5 MG/ML IJ SOLN: 0.6250 mg | Freq: Once | INTRAMUSCULAR | Status: DC | PRN

## 2023-11-12 MED ORDER — PROPOFOL 10 MG/ML IV BOLUS
INTRAVENOUS | Status: AC
  Filled 2023-11-12: qty 20

## 2023-11-12 MED ORDER — CEFAZOLIN SODIUM-DEXTROSE 2-4 GM/100ML-% IV SOLN
2.0000 g | INTRAVENOUS | Status: AC
  Administered 2023-11-12: 2 g via INTRAVENOUS
  Filled 2023-11-12: qty 100

## 2023-11-12 MED ORDER — ACETAMINOPHEN 325 MG PO TABS: 325.0000 mg | ORAL_TABLET | ORAL | Status: DC | PRN

## 2023-11-12 MED ORDER — FENTANYL CITRATE (PF) 100 MCG/2ML IJ SOLN
INTRAMUSCULAR | Status: AC
  Filled 2023-11-12: qty 2

## 2023-11-12 MED ORDER — ORAL CARE MOUTH RINSE: 15.0000 mL | Freq: Once | OROMUCOSAL | Status: AC

## 2023-11-12 MED ORDER — STERILE WATER FOR IRRIGATION IR SOLN
Status: DC | PRN
  Administered 2023-11-12: 3000 mL

## 2023-11-12 MED ORDER — ACETAMINOPHEN 10 MG/ML IV SOLN
INTRAVENOUS | Status: AC
  Filled 2023-11-12: qty 100

## 2023-11-12 MED ORDER — LIDOCAINE HCL (CARDIAC) PF 100 MG/5ML IV SOSY
PREFILLED_SYRINGE | INTRAVENOUS | Status: DC | PRN
  Administered 2023-11-12: 40 mg via INTRAVENOUS

## 2023-11-12 MED ORDER — PROPOFOL 10 MG/ML IV BOLUS
INTRAVENOUS | Status: DC | PRN
  Administered 2023-11-12: 30 mg via INTRAVENOUS
  Administered 2023-11-12 (×2): 20 mg via INTRAVENOUS

## 2023-11-12 MED ORDER — LACTATED RINGERS IV SOLN: INTRAVENOUS | Status: DC

## 2023-11-12 MED ORDER — EPHEDRINE 5 MG/ML INJ
INTRAVENOUS | Status: AC
  Filled 2023-11-12: qty 5

## 2023-11-12 MED ORDER — PHENYLEPHRINE HCL-NACL 20-0.9 MG/250ML-% IV SOLN
INTRAVENOUS | Status: AC
  Filled 2023-11-12: qty 250

## 2023-11-12 MED ORDER — ACETAMINOPHEN 10 MG/ML IV SOLN
INTRAVENOUS | Status: DC | PRN
  Administered 2023-11-12: 1000 mg via INTRAVENOUS

## 2023-11-12 MED ORDER — ACETAMINOPHEN 10 MG/ML IV SOLN: 1000.0000 mg | Freq: Once | INTRAVENOUS | Status: DC | PRN

## 2023-11-12 MED ORDER — IOHEXOL 300 MG/ML  SOLN
INTRAMUSCULAR | Status: DC | PRN
  Administered 2023-11-12: 19 mL

## 2023-11-12 MED ORDER — EPHEDRINE SULFATE (PRESSORS) 50 MG/ML IJ SOLN
INTRAMUSCULAR | Status: DC | PRN
  Administered 2023-11-12 (×3): 5 mg via INTRAVENOUS

## 2023-11-12 MED ORDER — ONDANSETRON HCL 4 MG/2ML IJ SOLN
INTRAMUSCULAR | Status: DC | PRN
  Administered 2023-11-12: 4 mg via INTRAVENOUS

## 2023-11-12 MED ORDER — DEXAMETHASONE SODIUM PHOSPHATE 10 MG/ML IJ SOLN
INTRAMUSCULAR | Status: AC
  Filled 2023-11-12: qty 1

## 2023-11-12 MED ORDER — PROPOFOL 500 MG/50ML IV EMUL
INTRAVENOUS | Status: DC | PRN
  Administered 2023-11-12: 135 ug/kg/min via INTRAVENOUS
  Administered 2023-11-12: 125 ug/kg/min via INTRAVENOUS

## 2023-11-12 MED ORDER — OXYCODONE HCL 5 MG/5ML PO SOLN: 5.0000 mg | Freq: Once | ORAL | Status: DC | PRN

## 2023-11-12 MED ORDER — OXYCODONE HCL 5 MG PO TABS: 5.0000 mg | ORAL_TABLET | Freq: Once | ORAL | Status: DC | PRN

## 2023-11-12 MED ORDER — ACETAMINOPHEN 160 MG/5ML PO SOLN: 325.0000 mg | ORAL | Status: DC | PRN

## 2023-11-12 MED ORDER — DEXAMETHASONE SODIUM PHOSPHATE 4 MG/ML IJ SOLN
INTRAMUSCULAR | Status: DC | PRN
  Administered 2023-11-12: 8 mg via INTRAVENOUS

## 2023-11-12 SURGICAL SUPPLY — 25 items
BAG COUNTER SPONGE SURGICOUNT (BAG) IMPLANT
BAG URINE DRAIN 2000ML AR STRL (UROLOGICAL SUPPLIES) IMPLANT
BAG URO CATCHER STRL LF (MISCELLANEOUS) ×4 IMPLANT
CATH FOLEY 2WAY SLVR 30CC 24FR (CATHETERS) IMPLANT
CATH FOLEY 2WAY SLVR 5CC 18FR (CATHETERS) IMPLANT
CATH URETL OPEN END 6FR 70 (CATHETERS) ×4 IMPLANT
CLOTH BEACON ORANGE TIMEOUT ST (SAFETY) ×4 IMPLANT
DRAPE FOOT SWITCH (DRAPES) ×4 IMPLANT
EVACUATOR MICROVAS BLADDER (UROLOGICAL SUPPLIES) IMPLANT
GLOVE SURG LX STRL 8.0 MICRO (GLOVE) ×4 IMPLANT
GOWN STRL REUS W/ TWL XL LVL3 (GOWN DISPOSABLE) ×8 IMPLANT
GOWN STRL REUS W/TWL XL LVL3 (GOWN DISPOSABLE) ×3
GUIDEWIRE STR DUAL SENSOR (WIRE) ×4 IMPLANT
KIT TURNOVER KIT A (KITS) IMPLANT
LOOP CUT BIPOLAR 24F LRG (ELECTROSURGICAL) ×4 IMPLANT
MANIFOLD NEPTUNE II (INSTRUMENTS) ×4 IMPLANT
NDL SAFETY ECLIPSE 18X1.5 (NEEDLE) ×4 IMPLANT
PACK CYSTO (CUSTOM PROCEDURE TRAY) ×4 IMPLANT
PAD PREP 24X48 CUFFED NSTRL (MISCELLANEOUS) ×4 IMPLANT
PENCIL SMOKE EVACUATOR (MISCELLANEOUS) IMPLANT
SYR TOOMEY IRRIG 70ML (MISCELLANEOUS)
SYRINGE TOOMEY IRRIG 70ML (MISCELLANEOUS) IMPLANT
TUBING CONNECTING 10 (TUBING) ×4 IMPLANT
TUBING UROLOGY SET (TUBING) ×4 IMPLANT
WATER STERILE IRR 3000ML UROMA (IV SOLUTION) ×4 IMPLANT

## 2023-11-12 NOTE — Anesthesia Postprocedure Evaluation (Signed)
Anesthesia Post Note  Patient: Kyle Owen  Procedure(s) Performed: CYSTOSCOPY WITH RETROGRADE PYELOGRAM (Bilateral) CYSTOSCOPY WITH BLADDER  BIOPSY AND PROSTATIC URETHRA URETEROSCOPY (Left)     Patient location during evaluation: PACU Anesthesia Type: General Level of consciousness: awake and alert Pain management: pain level controlled Vital Signs Assessment: post-procedure vital signs reviewed and stable Respiratory status: spontaneous breathing, nonlabored ventilation, respiratory function stable and patient connected to nasal cannula oxygen Cardiovascular status: blood pressure returned to baseline and stable Postop Assessment: no apparent nausea or vomiting Anesthetic complications: no  No notable events documented.  Last Vitals:  Vitals:   11/12/23 0915 11/12/23 0926  BP: (!) 145/86 (!) 130/97  Pulse: 65 61  Resp: 18 16  Temp:  36.5 C  SpO2: 96% 96%    Last Pain:  Vitals:   11/12/23 0926  TempSrc: Oral  PainSc: 0-No pain                 Shelton Silvas

## 2023-11-12 NOTE — Op Note (Signed)
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  Right sided bladder biopsies (including small papillary tumor) 2.  Left/mid bladder neck biopsies 3.  Prostatic urethral biopsies  Indications: 82 year old male with recurrent CIS/high-grade nonmuscle invasive bladder cancer.  Recent cystoscopy and September of this year revealed normal-appearing urothelium of the lower urinary tract.  He did have positive cytology of bladder washings at that time.  CT abdomen and pelvis with contrast was performed revealing no evidence of upper tract abnormalities.  He presents at this time for anesthetic cystoscopy, biopsies, retrograde ureteropyelogram's.  Findings: Urethra was free of stricture or lesions.  Prostatic urethra was nonobstructive, without any lesions of the urothelium.  Ureteral orifices were normal bilaterally.  There was 1 small papillary area to the right and slightly superior to the right ureteral orifice, consistent with a urothelial carcinoma recurrence.  1 small erythematous area approximate 1 cm superior and medial to this.  Minimal cobblestone appearance to the bladder neck on the left.  No other urothelial abnormalities were noted.  Retrograde study of both ureter and pyelocalyceal systems was performed.  On the right, no filling defects, no hydronephrosis noted.  There was no pyelocaliectasis.  Similar appearance on the left except for 1 small persistent filling defect in the distal ureter approximately 2 cm from the orifice.  Ureteroscopically there was no abnormality here.  Description of procedure: Patient properly identified  in the holding area, taken to the operating room where IV Ancef was administered, he was anesthetized and placed in the dorsolithotomy position.  Genitalia and perineum were prepped draped, proper timeout performed.  21 French panendoscope advanced into the bladder.  The findings were as above.  Retrograde studies were performed using a 6 [place] open-ended catheter and Omnipaque.  Except for the small persistent filling defect on the left, they were normal.  To better evaluate the left ureter I passed a 4.5 French semirigid, single lumen ureteroscope.  This was passed all the way to the ureteropelvic junction.  No filling defect/urothelial abnormality was seen.  This was then removed.  The cystoscope was replaced with the cold cup biopsy forceps.  The small papillary lesion was biopsied on the right as well as the small erythematous area just above this.  This was sent labeled sample #1 "right sided bladder biopsies".  Biopsies were then taken from the left bladder neck area x 2, sent labeled sample to "left mid/bladder neck biopsies".  Prostatic urethral biopsies were sent as a third specimen.  I then used the Bugbee electrode to cauterize all biopsy sites.  There was adequate hemostasis at this time.  The scope was removed.  18 French Foley catheter was placed, balloon filled with 10 cc of water, hooked to dependent drainage.  At this point the procedure was terminated.  The patient was awakened and taken to the PACU, having tolerated the procedure well.

## 2023-11-12 NOTE — Transfer of Care (Signed)
Immediate Anesthesia Transfer of Care Note  Patient: Kyle Owen  Procedure(s) Performed: Procedure(s): CYSTOSCOPY WITH RETROGRADE PYELOGRAM (Bilateral) CYSTOSCOPY WITH BLADDER  BIOPSY AND PROSTATIC URETHRA (N/A) URETEROSCOPY (Left)  Patient Location: PACU  Anesthesia Type:General  Level of Consciousness: Patient easily awoken, comfortable, cooperative, following commands, responds to stimulation.   Airway & Oxygen Therapy: Patient spontaneously breathing, ventilating well, oxygen via simple oxygen mask.  Post-op Assessment: Report given to PACU RN, vital signs reviewed and stable, moving all extremities.   Post vital signs: Reviewed and stable.  Complications: No apparent anesthesia complications Last Vitals:  Vitals Value Taken Time  BP 110/87 11/12/23 0831  Temp    Pulse 65 11/12/23 0833  Resp 18 11/12/23 0833  SpO2 93 % 11/12/23 0833  Vitals shown include unfiled device data.  Last Pain:  Vitals:   11/12/23 0607  TempSrc: Oral  PainSc:          Complications: No notable events documented.

## 2023-11-12 NOTE — H&P (Signed)
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  He recently was in hospital for COPD exacerbation.  Past Medical History:  Diagnosis Date   A-fib (HCC)    Anticoagulant long-term use    eliquis--- managed by cardiology   Benign prostatic hypertrophy with urinary frequency    Bladder cancer (HCC)    Chronic respiratory failure with hypoxia (HCC)    Complication of anesthesia    gets combative in recovery   COPD with chronic bronchitis and emphysema (HCC)    pulmonologist--- dr Vassie Loll;  nocturnal oxygen,  no daytime oxygen use   Dependence on nocturnal oxygen therapy    Depression    Dyspnea    when walking   History of colon polyps    History of kidney stones    History of loop recorder    loop recorder in place battery is dead has not had changed due to covid   History of TIA (transient ischemic attack)    (per pt no residual ) per neurologist note (dr Marjory Lies 11-08-2015) dx cryptogenic TIA versus arrhythia versus dysautonomia (right ophthalmic artery TIA and x3 posterior circulation TIAs   Hyperlipidemia    Hypertension    Pt denies   Malignant neoplasm of overlapping sites of bladder [facility])    urologist--- dr Retta Diones   Multinodular thyroid    per pathology report 11-12-2014 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    Nephrolithiasis    OA (osteoarthritis)    DJD right knee   Ocular migraine    controlled w/ verapamil   On home oxygen therapy    PAF (paroxysmal atrial fibrillation) (HCC)    followed by AFIB clinic-- cardiologist-- dr Anne Fu   Pneumonia    2023   Pulmonary nodule, left    left lower lobe x2 per CT 01-20-2016   Renal cyst, left    Stroke [facility])    Thoracic aortic aneurysm without rupture [facility])    followed by dr Zenaida Niece  tright ;   ascending -- ct chest 01-24-2023   4.8cm   Wears hearing aid in both ears    wear at times    Past Surgical History:  Procedure Laterality Date   ANTERIOR CERVICAL DECOMP/DISCECTOMY FUSION N/A 04/03/2017   Procedure: Cervical three-four, Cervical four-five Anterior cervical decompression/discectomy/fusion;  Surgeon: Maeola Harman, MD;  Location: [facility] OR;  Service: Neurosurgery;  Laterality: N/A;   CATARACT EXTRACTION W/ INTRAOCULAR LENS IMPLANT Right 2016   CATARACT EXTRACTION W/PHACO  12/16/2012   Procedure: CATARACT EXTRACTION PHACO AND INTRAOCULAR LENS PLACEMENT (IOC);  Surgeon: Gemma Payor, MD;  Location: [facility] ORS;  Service: Ophthalmology;  Laterality: Left;  CDE:17.31   COLONOSCOPY  10/03/2011   Procedure: COLONOSCOPY;  Surgeon: Dalia Heading;  Location: [facility] END[facility] SUITE;  Service: Gastroenterology;  Laterality: N/A;   CYSTOSCOPY W/ RETROGRADES Bilateral 04/12/2023   Procedure: CYSTOSCOPY WITH RETROGRADE PYELOGRAM;  Surgeon: Marcine Matar, MD;  Location: [facility] ORS;  Service: Urology;  Laterality: Bilateral;   CYSTOSCOPY WITH BIOPSY N/A 04/12/2023   Procedure: CYSTOSCOPY WITH BLADDER BIOPSY;  Surgeon: Marcine Matar, MD;  Location: [facility];  Service: Urology;  Laterality: N/A;  45 MINS   CYSTOSCOPY/RETROGRADE/URETEROSCOPY/STONE EXTRACTION WITH BASKET Right 03/29/2020   Procedure: CYSTOSCOPY/RETROGRADE/URETEROSCOPY/STONE EXTRACTION WITH BASKET;  Surgeon: Marcine Matar, MD;  Location: [facility];  Service: Urology;  Laterality: Right;  1 HR   DECOMPRESSIOIN ULNAR NERVE AND CUBITAL TUNNEL RELEASE Left 07/14/2009   elbow   EP IMPLANTABLE DEVICE N/A 08/10/2015   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   HOLMIUM LASER APPLICATION Right 03/29/2020   Procedure: HOLMIUM LASER APPLICATION;  Surgeon: Marcine Matar, MD;  Location: [facility];  Service: Urology;  Laterality: Right;   INGUINAL HERNIA REPAIR Right 1990's   KNEE ARTHROSCOPY Right 2015   POSTERIOR LUMBAR FUSION      2014   L3--4;    12/ 2018   L4--5   SHOULDER ARTHROSCOPY Left 1990's   TEE WITHOUT CARDIOVERSION N/A 07/27/2015   Procedure: TRANSESOPHAGEAL ECHOCARDIOGRAM (TEE);  Surgeon: Lewayne Bunting, MD;  Location: [facility] ENDOSCOPY;  Service: Cardiovascular;  Laterality: N/A;   normal LV function, ef 55-60%,  mild AR and MR, mild dilated ascending aorta (4.2cm),  mild to moderate atherosclerosis  descending aorta,  mild to moderate TR,  negative saline microcavitation study   THYROIDECTOMY Right 01/20/2015   Procedure: RIGHT THYROIDECTOMY;  Surgeon: Darletta Moll, MD;  Location: [facility] OR;  Service: ENT;  Laterality: Right;   TONSILLECTOMY  as child   TOTAL HIP ARTHROPLASTY Left 12/14/2020   Procedure: TOTAL HIP ARTHROPLASTY ANTERIOR APPROACH;  Surgeon: Sheral Apley, MD;  Location: [facility];  Service: Orthopedics;  Laterality: Left;   TRANSURETHRAL RESECTION OF BLADDER TUMOR N/A 05/15/2016   Procedure: TRANSURETHRAL RESECTION OF BLADDER TUMOR (TURBT) AND INSTILLATION OF EPIRUBICIN;  Surgeon: Marcine Matar, MD;  Location: [facility];  Service: Urology;  Laterality: N/A;   TRANSURETHRAL RESECTION OF BLADDER TUMOR N/A 03/29/2020   Procedure: TRANSURETHRAL RESECTION OF BLADDER TUMOR (TURBT);  Surgeon: Marcine Matar, MD;  Location: [facility];  Service: Urology;  Laterality: N/A;   TRANSURETHRAL RESECTION OF BLADDER TUMOR WITH MITOMYCIN-C N/A 01/23/2022   Procedure: TRANSURETHRAL RESECTION OF BLADDER TUMOR WITH POST OPERATIVE GEMCITABINE INSTILLATION;  Surgeon: Marcine Matar, MD;  Location: [facility];  Service: Urology;  Laterality: N/A;    Home Medications:    Allergies:  Allergies  Allergen Reactions   Flomax [Tamsulosin Hcl] Nausea And Vomiting and Other (See Comments)    "Pt thought he was dying " DIZZY   Fentanyl Other (See Comments)    Makes me combative  Pt states he's had it since then and was fine 10-19-23    Family History  Problem Relation Age of Onset   Stroke Sister     Breast cancer Sister    Stroke Brother     Social History:  reports that he quit smoking about 13 years ago. His smoking use included cigarettes. He started smoking about 58 years ago. He has a 45 pack-year smoking history. He has never used smokeless tobacco. He reports that he does not drink alcohol and does not use drugs.  ROS: A complete review of systems was performed.  All systems are negative except for pertinent findings as noted.  Physical Exam:  Vital signs in last 24 hours: BP 135/64   Pulse (!) 56   Temp 98.3 F (36.8 C) (Oral)   Ht 6\' 1"  (1.854 m)   Wt 86 kg   SpO2 95%   BMI 25.03 kg/m  Constitutional:  Alert and oriented, No acute distress Cardiovascular: Regular rate  Respiratory: Normal respiratory effort Lymphatic: No lymphadenopathy Neurologic: Grossly intact, no focal deficits Psychiatric: Normal mood and affect  I have reviewed prior pt notes  I have reviewed notes from referring/previous physicians--recent hospitalization  I have reviewed urinalysis results  I have independently reviewed prior imaging--recent CT A/P    Impression/Assessment:  H/O CIS/HGNMIBC w/ persistent + cytologies  Plan:  Cysto, Bladder biopsies/urethral biopsies

## 2023-11-12 NOTE — Interval H&P Note (Signed)
History and Physical Interval Note:  11/12/2023 7:32 AM  Kyle Owen  has presented today for surgery, with the diagnosis of bladder cancer.  The various methods of treatment have been discussed with the patient and family. After consideration of risks, benefits and other options for treatment, the patient has consented to  Procedure(s): CYSTOSCOPY WITH RETROGRADE PYELOGRAM (Bilateral) CYSTOSCOPY WITH BIOPSY- bladder biopsy (N/A) as a surgical intervention.  The patient's history has been reviewed, patient examined, no change in status, stable for surgery.  I have reviewed the patient's chart and labs.  Questions were answered to the patient's satisfaction.     Bertram Millard Marisabel Macpherson

## 2023-11-12 NOTE — Discharge Instructions (Addendum)
You may see some blood in the urine and may have some burning with urination for 48-72 hours. You also may notice that you have to urinate more frequently or urgently after your procedure which is normal.  You should call should you develop an inability urinate, fever > 101, persistent nausea and vomiting that prevents you from eating or drinking to stay hydrated.  If you have a catheter, you will be taught how to take care of the catheter by the nursing staff prior to discharge from the hospital.  You may periodically feel a strong urge to void with the catheter in place.  This is a bladder spasm and most often can occur when having a bowel movement or moving around. It is typically self-limited and usually will stop after a few minutes.  You may use some Vaseline or Neosporin around the tip of the catheter to reduce friction at the tip of the penis. You may also see some blood in the urine.  A very small amount of blood can make the urine look quite red.  As long as the catheter is draining well, there usually is not a problem.  However, if the catheter is not draining well and is bloody, you should call the office [phone]) to notify us.  It is okay to remove the catheter on Tuesday morning if the urine is fairly clear.  You can restart the Eliquis once your urine has turned yellow.

## 2023-11-12 NOTE — Anesthesia Procedure Notes (Signed)
Procedure Name: LMA Insertion Date/Time: 11/12/2023 7:47 AM  Performed by: Ludwig Lean, CRNAPre-anesthesia Checklist: Patient identified, Emergency Drugs available, Suction available and Patient being monitored Patient Re-evaluated:Patient Re-evaluated prior to induction Oxygen Delivery Method: Circle system utilized Preoxygenation: Pre-oxygenation with 100% oxygen Induction Type: IV induction Ventilation: Mask ventilation without difficulty LMA: LMA inserted LMA Size: 5.0 Number of attempts: 1 Placement Confirmation: positive ETCO2 and breath sounds checked- equal and bilateral Tube secured with: Tape Dental Injury: Teeth and Oropharynx as per pre-operative assessment

## 2023-11-13 ENCOUNTER — Encounter (HOSPITAL_COMMUNITY): Payer: Self-pay | Admitting: Urology

## 2023-11-13 LAB — SURGICAL PATHOLOGY

## 2023-11-20 ENCOUNTER — Ambulatory Visit: Payer: Medicare Other | Admitting: Urology

## 2023-11-26 ENCOUNTER — Encounter: Payer: Self-pay | Admitting: Cardiothoracic Surgery

## 2023-11-26 ENCOUNTER — Ambulatory Visit: Payer: Medicare Other | Admitting: Cardiothoracic Surgery

## 2023-11-26 VITALS — BP 137/73 | HR 67 | Resp 18 | Ht 73.0 in | Wt 194.0 lb

## 2023-11-26 DIAGNOSIS — I7121 Aneurysm of the ascending aorta, without rupture: Secondary | ICD-10-CM

## 2023-11-26 DIAGNOSIS — R911 Solitary pulmonary nodule: Secondary | ICD-10-CM | POA: Insufficient documentation

## 2023-11-26 NOTE — Progress Notes (Signed)
HPI: Patient returns for scheduled follow-up with CT scan for an asymptomatic stable fusiform ascending aneurysm first noted 2016.  I personally reviewed the recent CT scan showing the aortic measurement to be 4.8 cm in diameter, no change.  Patient has no symptoms or chest pain.  Over the past year since his previous visit his general health is declining.  He is now on home oxygen for his COPD.  He was hospitalized at [facility] for respiratory failure.  Since discharge he has had a mild productive cough of white sputum, possibly related to working in his yard collecting leaves.  No hemoptysis or purulent material.   He is being treated for bladder cancer by Dr. Retta Diones with t plan for topical bladder instillation chemotherapy as he has had no response to BCG.  Review of the current CT scan of chest shows a new 6 mm nodule in the lingula as well as his previous scarring from COPD.  A recent CT of the abdomen showed no evidence of metastatic bladder cancer.  Current Outpatient Medications  Medication Sig Dispense Refill   acetaminophen (TYLENOL) 500 MG tablet Take 500 mg by mouth in the morning and at bedtime.     atorvastatin (LIPITOR) 20 MG tablet TAKE 1 TABLET BY MOUTH DAILY. 90 tablet 2   budesonide-formoterol (SYMBICORT) 160-4.5 MCG/ACT inhaler INHALE 2 PUFFS INTO THE LUNGS TWICE DAILY. 10.2 g 11   cetirizine (ZYRTEC) 10 MG tablet Take 10 mg by mouth daily.     Cholecalciferol (VITAMIN D) 50 MCG (2000 UT) tablet Take 2,000 Units by mouth in the morning.     ELIQUIS 5 MG TABS tablet Take 5 mg by mouth 2 (two) times daily.     finasteride (PROSCAR) 5 MG tablet Take 5 mg by mouth in the morning.     Magnesium 250 MG TABS Take 250 mg by mouth in the morning and at bedtime.     OXYGEN Inhale 2.5 L into the lungs at bedtime.     sertraline (ZOLOFT) 50 MG tablet Take 50 mg by mouth every morning.     VENTOLIN HFA 108 (90 BASE) MCG/ACT inhaler Inhale 2 puffs into the lungs Every 4 hours as  needed for wheezing or shortness of breath.      verapamil (CALAN-SR) 240 MG CR tablet Take 240 mg by mouth in the morning.     ipratropium-albuterol (DUONEB) 0.5-2.5 (3) MG/3ML SOLN Take 3 mLs by nebulization every 6 (six) hours as needed (as needed for shortness of breath or wheezing). 360 mL 0   No current facility-administered medications for this visit.     Physical Exam: Blood pressure 137/73, pulse 67, resp. rate 18, height 6\' 1"  (1.854 m), weight 194 lb (88 kg), SpO2 90%.        Exam    General- alert and comfortable but appears frail on home oxygen and using a cane.    Neck- no JVD, no cervical adenopathy palpable, no carotid bruit   Lungs-distant breath sounds bilaterally without wheezes   Cor- regular rate and rhythm, no murmur , gallop   Abdomen- soft, non-tender   Extremities - warm, non-tender, 2+ bilateral pedal edema   Neuro- oriented, appropriate, generalized weakness  Diagnostic Tests: CT scan of the chest without contrast in November 2024 personally reviewed and discussed with patient and daughter.  The fusiform ascending aortic aneurysm is stable at 4.8 cm and represents low risk.  There is a new finding of a lingular 6 mm nodule which will  be followed up with a CT scan in 6 months.  Impression: #1 stable fusiform ascending aneurysm, no change since 2016 at 4.8 cm #2 COPD, recent hospitalization for respiratory failure, now on home oxygen, lingular 6 mm  nodule noted Plan: Patient will return in 6 months for CT chest without contrast to assess the lung nodule as well as the TAA.  For his mildly productive cough he will use his albuterol MDI and Mucinex.   Lovett Sox, MD [facility] [phone]

## 2023-11-29 ENCOUNTER — Encounter: Payer: Self-pay | Admitting: Urology

## 2023-12-03 NOTE — Progress Notes (Signed)
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   Initial TURBT of a 2 cm papillary bladder tumor on 5.22.2017.  Pathology revealed papillary low-grade NMIBC. Epirubicin was placed post resection.   4.5.2021: He underwent cystoscopy, biopsy of papillary lesions at the right ureteral orifice (path--urothelial papilloma), right ureteroscopy with holmium laser and extraction of stone as well as stent placement.  He has since remove the stent.  He has passed multiple small fragments.   7.12.2022: Cystoscopy negative  12.13.2022: Here early for follow-up.  Had 3 to 4 days of total gross painless hematuria which cleared up about a week ago.  No change in urinary pattern, no flank or abdominal symptoms at that time.  His CT scan for hematuria evaluation was negative.   1.30.2023: He underwent cystoscopy, bladder biopsy and TUR of papillary lesions within the prostatic urethra.  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  2.14.2023: He has urgency and frequency since his procedure but no current dysuria or gross hematuria.  His wife and daughter accompany him to the visit today.   3.28.2023: Completed BCG induction   5.9.2023: Cystoscopy negative.  Cytology revealed atypical cells.   6.28.2023: Completed maintenance BCG   8.15.2023: Cystoscopy negative, cytology revealed atypia but no evidence of high-grade urothelial carcinoma   9.26.2023: Completed maintenance BCG.  He tolerated these well.   11.7.2023: Cysto negative. Cytology--atypical cells.   1.22.2024: Completed maintenance BCG.   3.5.2024: Cytologies +  4.16.2024: Cysto, bladder bx (small lesion), (B) RGPs, bladder washing. Biopsies, cytologies positive for CIS. RGPs normal.  6.18.2024: Completed 2nd induction BCG series (6 instillations).  9.3.2024: Cystoscopy revealed stable appearance  of bladder.  Cytology revealed evidence of high-grade urothelial carcinoma  11.18.2024: Underwent anesthetic cystoscopy, retrograde pyelograms, biopsies of bladder and prostatic urethra.  Retrograde studies were normal, all biopsies revealed benign findings.  12.10.2024: He is here today for postop check.  He really did not have any real issues after the last procedure.  He was wife are relieved with the biopsy results.  Past Medical History:  Diagnosis Date   A-fib (HCC)    Anticoagulant long-term use    eliquis--- managed by cardiology   Benign prostatic hypertrophy with urinary frequency    Bladder cancer (HCC)    Chronic respiratory failure with hypoxia (HCC)    Complication of anesthesia    gets combative in recovery   COPD with chronic bronchitis and emphysema (HCC)    pulmonologist--- dr Vassie Loll;  nocturnal oxygen,  no daytime oxygen use   Dependence on nocturnal oxygen therapy    Depression    Dyspnea    when walking   History of colon polyps    History of kidney stones    History of loop recorder    loop recorder in place battery is dead has not had changed due to covid   History of TIA (transient ischemic attack)    (per pt no residual ) per neurologist note (dr Marjory Lies 11-08-2015) dx cryptogenic TIA versus arrhythia versus dysautonomia (right ophthalmic artery TIA and x3 posterior circulation TIAs   Hyperlipidemia    Hypertension    Pt denies   Malignant neoplasm of overlapping sites of bladder [facility])    urologist--- dr Retta Diones   Multinodular thyroid    per pathology report 11-12-2014 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    Nephrolithiasis    OA (osteoarthritis)  DJD right knee   Ocular migraine    controlled w/ verapamil   On home oxygen therapy    PAF (paroxysmal atrial fibrillation) (HCC)    followed by AFIB clinic-- cardiologist-- dr Anne Fu   Pneumonia    2023   Pulmonary nodule, left    left lower lobe x2 per CT  01-20-2016   Renal cyst, left    Stroke [facility])    Thoracic aortic aneurysm without rupture [facility])    followed by dr Morton Peters ;   ascending -- ct chest 01-24-2023   4.8cm   Wears hearing aid in both ears    wear at times    Past Surgical History:  Procedure Laterality Date   ANTERIOR CERVICAL DECOMP/DISCECTOMY FUSION N/A 04/03/2017   Procedure: Cervical three-four, Cervical four-five Anterior cervical decompression/discectomy/fusion;  Surgeon: Maeola Harman, MD;  Location: [facility] OR;  Service: Neurosurgery;  Laterality: N/A;   CATARACT EXTRACTION W/ INTRAOCULAR LENS IMPLANT Right 2016   CATARACT EXTRACTION W/PHACO  12/16/2012   Procedure: CATARACT EXTRACTION PHACO AND INTRAOCULAR LENS PLACEMENT (IOC);  Surgeon: Gemma Payor, MD;  Location: [facility];  Service: Ophthalmology;  Laterality: Left;  CDE:17.31   COLONOSCOPY  10/03/2011   Procedure: COLONOSCOPY;  Surgeon: Dalia Heading;  Location: [facility];  Service: Gastroenterology;  Laterality: N/A;   CYSTOSCOPY W/ RETROGRADES Bilateral 04/12/2023   Procedure: CYSTOSCOPY WITH RETROGRADE PYELOGRAM;  Surgeon: Marcine Matar, MD;  Location: [facility];  Service: Urology;  Laterality: Bilateral;   CYSTOSCOPY W/ RETROGRADES Bilateral 11/12/2023   Procedure: CYSTOSCOPY WITH RETROGRADE PYELOGRAM;  Surgeon: Marcine Matar, MD;  Location: [facility];  Service: Urology;  Laterality: Bilateral;   CYSTOSCOPY WITH BIOPSY N/A 04/12/2023   Procedure: CYSTOSCOPY WITH BLADDER BIOPSY;  Surgeon: Marcine Matar, MD;  Location: [facility];  Service: Urology;  Laterality: N/A;  45 MINS   CYSTOSCOPY WITH BIOPSY N/A 11/12/2023   Procedure: CYSTOSCOPY WITH BLADDER  BIOPSY AND PROSTATIC URETHRA;  Surgeon: Marcine Matar, MD;  Location: [facility];  Service: Urology;  Laterality: N/A;   CYSTOSCOPY/RETROGRADE/URETEROSCOPY/STONE EXTRACTION WITH BASKET Right 03/29/2020   Procedure: CYSTOSCOPY/RETROGRADE/URETEROSCOPY/STONE EXTRACTION WITH BASKET;  Surgeon: Marcine Matar, MD;   Location: [facility];  Service: Urology;  Laterality: Right;  1 HR   DECOMPRESSIOIN ULNAR NERVE AND CUBITAL TUNNEL RELEASE Left 07/14/2009   elbow   EP IMPLANTABLE DEVICE N/A 08/10/2015   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   HOLMIUM LASER APPLICATION Right 03/29/2020   Procedure: HOLMIUM LASER APPLICATION;  Surgeon: Marcine Matar, MD;  Location: [facility];  Service: Urology;  Laterality: Right;   INGUINAL HERNIA REPAIR Right 1990's   KNEE ARTHROSCOPY Right 2015   POSTERIOR LUMBAR FUSION     2014   L3--4;    12/ 2018   L4--5   SHOULDER ARTHROSCOPY Left 1990's   TEE WITHOUT CARDIOVERSION N/A 07/27/2015   Procedure: TRANSESOPHAGEAL ECHOCARDIOGRAM (TEE);  Surgeon: Lewayne Bunting, MD;  Location: [facility] ENDOSCOPY;  Service: Cardiovascular;  Laterality: N/A;   normal LV function, ef 55-60%,  mild AR and MR, mild dilated ascending aorta (4.2cm),  mild to moderate atherosclerosis  descending aorta,  mild to moderate TR,  negative saline microcavitation study   THYROIDECTOMY Right 01/20/2015   Procedure: RIGHT THYROIDECTOMY;  Surgeon: Darletta Moll, MD;  Location: [facility] OR;  Service: ENT;  Laterality: Right;   TONSILLECTOMY  as child   TOTAL HIP ARTHROPLASTY Left 12/14/2020   Procedure: TOTAL HIP ARTHROPLASTY ANTERIOR APPROACH;  Surgeon: Sheral Apley, MD;  Location: [facility];  Service: Orthopedics;  Laterality: Left;   TRANSURETHRAL RESECTION OF BLADDER TUMOR N/A 05/15/2016   Procedure: TRANSURETHRAL RESECTION OF BLADDER TUMOR (TURBT) AND INSTILLATION OF EPIRUBICIN;  Surgeon: Marcine Matar, MD;  Location: [facility];  Service: Urology;  Laterality: N/A;   TRANSURETHRAL RESECTION OF BLADDER TUMOR N/A 03/29/2020   Procedure: TRANSURETHRAL RESECTION OF BLADDER TUMOR (TURBT);  Surgeon: Marcine Matar, MD;  Location: [facility];  Service: Urology;  Laterality: N/A;   TRANSURETHRAL RESECTION OF BLADDER TUMOR WITH MITOMYCIN-C N/A 01/23/2022   Procedure: TRANSURETHRAL RESECTION  OF BLADDER TUMOR WITH POST OPERATIVE GEMCITABINE INSTILLATION;  Surgeon: Marcine Matar, MD;  Location: [facility];  Service: Urology;  Laterality: N/A;   URETEROSCOPY Left 11/12/2023   Procedure: URETEROSCOPY;  Surgeon: Marcine Matar, MD;  Location: [facility];  Service: Urology;  Laterality: Left;    Home Medications:  Allergies as of 12/04/2023       Reactions   Flomax [tamsulosin Hcl] Nausea And Vomiting, Other (See Comments)   "Pt thought he was dying " DIZZY   Fentanyl Other (See Comments)   Makes me combative  Pt states he's had it since then and was fine 10-19-23        Medication List        Accurate as of December 04, 2023  8:52 AM. If you have any questions, ask your nurse or doctor.          acetaminophen 500 MG tablet Commonly known as: TYLENOL Take 500 mg by mouth in the morning and at bedtime.   atorvastatin 20 MG tablet Commonly known as: LIPITOR TAKE 1 TABLET BY MOUTH DAILY.   budesonide-formoterol 160-4.5 MCG/ACT inhaler Commonly known as: SYMBICORT INHALE 2 PUFFS INTO THE LUNGS TWICE DAILY.   cetirizine 10 MG tablet Commonly known as: ZYRTEC Take 10 mg by mouth daily.   Eliquis 5 MG Tabs tablet Generic drug: apixaban Take 5 mg by mouth 2 (two) times daily.   finasteride 5 MG tablet Commonly known as: PROSCAR Take 5 mg by mouth in the morning.   ipratropium-albuterol 0.5-2.5 (3) MG/3ML Soln Commonly known as: DUONEB Take 3 mLs by nebulization every 6 (six) hours as needed (as needed for shortness of breath or wheezing).   Magnesium 250 MG Tabs Take 250 mg by mouth in the morning and at bedtime.   OXYGEN Inhale 2.5 L into the lungs at bedtime.   sertraline 50 MG tablet Commonly known as: ZOLOFT Take 50 mg by mouth every morning.   Ventolin HFA 108 (90 Base) MCG/ACT inhaler Generic drug: albuterol Inhale 2 puffs into the lungs Every 4 hours as needed for wheezing or shortness of breath.   verapamil 240 MG CR tablet Commonly  known as: CALAN-SR Take 240 mg by mouth in the morning.   Vitamin D 50 MCG (2000 UT) tablet Take 2,000 Units by mouth in the morning.        Allergies:  Allergies  Allergen Reactions   Flomax [Tamsulosin Hcl] Nausea And Vomiting and Other (See Comments)    "Pt thought he was dying " DIZZY   Fentanyl Other (See Comments)    Makes me combative  Pt states he's had it since then and was fine 10-19-23    Family History  Problem Relation Age of Onset   Stroke Sister    Breast cancer Sister    Stroke Brother     Social History:  reports that he quit smoking about 13 years ago. His smoking use  included cigarettes. He started smoking about 58 years ago. He has a 45 pack-year smoking history. He has never used smokeless tobacco. He reports that he does not drink alcohol and does not use drugs.  ROS: A complete review of systems was performed.  All systems are negative except for pertinent findings as noted.  Physical Exam:  Vital signs in last 24 hours: There were no vitals taken for this visit. Constitutional:  Alert and oriented, No acute distress Cardiovascular: Regular rate  Respiratory: Normal respiratory effort Psychiatric: Normal mood and affect  I have reviewed prior pt note  I have reviewed urinalysis results  I have independently reviewed prior imaging  I have reviewed recent pathology results        Impression/Assessment:  History of bladder CA/CIS with completing his second BCG induction in January, 2024.  Positive cytologies a few months ago were followed up by bladder and prostatic urethral biopsies, retrogrades which were all normal  Plan:  I went over pathology results with them-do not think we need to cystoscopy him today  I think it is okay to hold off on further immunotherapy at the present time  I will have him come back in about 3 months for cystoscopy

## 2023-12-04 ENCOUNTER — Encounter: Payer: Self-pay | Admitting: Urology

## 2023-12-04 ENCOUNTER — Ambulatory Visit: Payer: Medicare Other | Admitting: Urology

## 2023-12-04 VITALS — BP 125/77

## 2023-12-04 DIAGNOSIS — Z08 Encounter for follow-up examination after completed treatment for malignant neoplasm: Secondary | ICD-10-CM | POA: Diagnosis not present

## 2023-12-04 DIAGNOSIS — D090 Carcinoma in situ of bladder: Secondary | ICD-10-CM

## 2023-12-04 DIAGNOSIS — Z8551 Personal history of malignant neoplasm of bladder: Secondary | ICD-10-CM | POA: Diagnosis not present

## 2023-12-04 DIAGNOSIS — C679 Malignant neoplasm of bladder, unspecified: Secondary | ICD-10-CM

## 2023-12-05 LAB — MICROSCOPIC EXAMINATION: Bacteria, UA: NONE SEEN

## 2023-12-05 LAB — URINALYSIS, ROUTINE W REFLEX MICROSCOPIC
Bilirubin, UA: NEGATIVE
Glucose, UA: NEGATIVE
Ketones, UA: NEGATIVE
Nitrite, UA: NEGATIVE
Protein,UA: NEGATIVE
RBC, UA: NEGATIVE
Specific Gravity, UA: 1.025 (ref 1.005–1.030)
Urobilinogen, Ur: 1 mg/dL (ref 0.2–1.0)
pH, UA: 6 (ref 5.0–7.5)

## 2023-12-29 ENCOUNTER — Inpatient Hospital Stay (HOSPITAL_COMMUNITY)
Admission: EM | Admit: 2023-12-29 | Discharge: 2024-01-07 | DRG: 871 | Disposition: A | Discharge status: Skilled Nursing Facility | Payer: Medicare Other | Attending: Internal Medicine | Admitting: Internal Medicine

## 2023-12-29 ENCOUNTER — Emergency Department (HOSPITAL_COMMUNITY): Payer: Medicare Other

## 2023-12-29 ENCOUNTER — Encounter (HOSPITAL_COMMUNITY): Payer: Self-pay

## 2023-12-29 ENCOUNTER — Other Ambulatory Visit: Payer: Self-pay

## 2023-12-29 DIAGNOSIS — G20A1 Parkinson's disease without dyskinesia, without mention of fluctuations: Secondary | ICD-10-CM | POA: Diagnosis present

## 2023-12-29 DIAGNOSIS — F32A Depression, unspecified: Secondary | ICD-10-CM | POA: Diagnosis present

## 2023-12-29 DIAGNOSIS — M1711 Unilateral primary osteoarthritis, right knee: Secondary | ICD-10-CM | POA: Diagnosis present

## 2023-12-29 DIAGNOSIS — J441 Chronic obstructive pulmonary disease with (acute) exacerbation: Principal | ICD-10-CM | POA: Diagnosis present

## 2023-12-29 DIAGNOSIS — N1832 Chronic kidney disease, stage 3b: Secondary | ICD-10-CM | POA: Diagnosis present

## 2023-12-29 DIAGNOSIS — I130 Hypertensive heart and chronic kidney disease with heart failure and stage 1 through stage 4 chronic kidney disease, or unspecified chronic kidney disease: Secondary | ICD-10-CM | POA: Diagnosis present

## 2023-12-29 DIAGNOSIS — Z7901 Long term (current) use of anticoagulants: Secondary | ICD-10-CM

## 2023-12-29 DIAGNOSIS — Z66 Do not resuscitate: Secondary | ICD-10-CM | POA: Diagnosis present

## 2023-12-29 DIAGNOSIS — E440 Moderate protein-calorie malnutrition: Secondary | ICD-10-CM | POA: Diagnosis present

## 2023-12-29 DIAGNOSIS — Z803 Family history of malignant neoplasm of breast: Secondary | ICD-10-CM

## 2023-12-29 DIAGNOSIS — J181 Lobar pneumonia, unspecified organism: Secondary | ICD-10-CM | POA: Diagnosis present

## 2023-12-29 DIAGNOSIS — I7121 Aneurysm of the ascending aorta, without rupture: Secondary | ICD-10-CM | POA: Diagnosis present

## 2023-12-29 DIAGNOSIS — J9621 Acute and chronic respiratory failure with hypoxia: Secondary | ICD-10-CM | POA: Diagnosis present

## 2023-12-29 DIAGNOSIS — Z823 Family history of stroke: Secondary | ICD-10-CM

## 2023-12-29 DIAGNOSIS — E785 Hyperlipidemia, unspecified: Secondary | ICD-10-CM | POA: Diagnosis present

## 2023-12-29 DIAGNOSIS — Z974 Presence of external hearing-aid: Secondary | ICD-10-CM

## 2023-12-29 DIAGNOSIS — I5031 Acute diastolic (congestive) heart failure: Secondary | ICD-10-CM | POA: Diagnosis present

## 2023-12-29 DIAGNOSIS — N401 Enlarged prostate with lower urinary tract symptoms: Secondary | ICD-10-CM | POA: Diagnosis present

## 2023-12-29 DIAGNOSIS — I4819 Other persistent atrial fibrillation: Secondary | ICD-10-CM | POA: Diagnosis present

## 2023-12-29 DIAGNOSIS — A419 Sepsis, unspecified organism: Secondary | ICD-10-CM | POA: Diagnosis present

## 2023-12-29 DIAGNOSIS — E890 Postprocedural hypothyroidism: Secondary | ICD-10-CM | POA: Diagnosis present

## 2023-12-29 DIAGNOSIS — Z515 Encounter for palliative care: Secondary | ICD-10-CM

## 2023-12-29 DIAGNOSIS — J4489 Other specified chronic obstructive pulmonary disease: Secondary | ICD-10-CM | POA: Diagnosis present

## 2023-12-29 DIAGNOSIS — Z8673 Personal history of transient ischemic attack (TIA), and cerebral infarction without residual deficits: Secondary | ICD-10-CM

## 2023-12-29 DIAGNOSIS — F02C11 Dementia in other diseases classified elsewhere, severe, with agitation: Secondary | ICD-10-CM | POA: Diagnosis not present

## 2023-12-29 DIAGNOSIS — Z1152 Encounter for screening for COVID-19: Secondary | ICD-10-CM

## 2023-12-29 DIAGNOSIS — Z7189 Other specified counseling: Secondary | ICD-10-CM | POA: Diagnosis not present

## 2023-12-29 DIAGNOSIS — F05 Delirium due to known physiological condition: Secondary | ICD-10-CM | POA: Diagnosis not present

## 2023-12-29 DIAGNOSIS — Z6826 Body mass index (BMI) 26.0-26.9, adult: Secondary | ICD-10-CM

## 2023-12-29 DIAGNOSIS — Z87891 Personal history of nicotine dependence: Secondary | ICD-10-CM

## 2023-12-29 DIAGNOSIS — F0284 Dementia in other diseases classified elsewhere, unspecified severity, with anxiety: Secondary | ICD-10-CM | POA: Diagnosis present

## 2023-12-29 DIAGNOSIS — G309 Alzheimer's disease, unspecified: Secondary | ICD-10-CM | POA: Diagnosis not present

## 2023-12-29 DIAGNOSIS — J439 Emphysema, unspecified: Secondary | ICD-10-CM | POA: Diagnosis present

## 2023-12-29 DIAGNOSIS — E782 Mixed hyperlipidemia: Secondary | ICD-10-CM | POA: Diagnosis present

## 2023-12-29 DIAGNOSIS — Z961 Presence of intraocular lens: Secondary | ICD-10-CM | POA: Diagnosis present

## 2023-12-29 DIAGNOSIS — Z8546 Personal history of malignant neoplasm of prostate: Secondary | ICD-10-CM

## 2023-12-29 DIAGNOSIS — J9622 Acute and chronic respiratory failure with hypercapnia: Secondary | ICD-10-CM | POA: Diagnosis present

## 2023-12-29 DIAGNOSIS — J440 Chronic obstructive pulmonary disease with acute lower respiratory infection: Secondary | ICD-10-CM | POA: Diagnosis present

## 2023-12-29 DIAGNOSIS — Z87442 Personal history of urinary calculi: Secondary | ICD-10-CM

## 2023-12-29 DIAGNOSIS — J9611 Chronic respiratory failure with hypoxia: Secondary | ICD-10-CM | POA: Diagnosis present

## 2023-12-29 DIAGNOSIS — I4891 Unspecified atrial fibrillation: Secondary | ICD-10-CM

## 2023-12-29 DIAGNOSIS — Z860100 Personal history of colon polyps, unspecified: Secondary | ICD-10-CM

## 2023-12-29 DIAGNOSIS — J189 Pneumonia, unspecified organism: Secondary | ICD-10-CM | POA: Diagnosis not present

## 2023-12-29 DIAGNOSIS — Z9981 Dependence on supplemental oxygen: Secondary | ICD-10-CM

## 2023-12-29 DIAGNOSIS — I480 Paroxysmal atrial fibrillation: Secondary | ICD-10-CM | POA: Diagnosis present

## 2023-12-29 DIAGNOSIS — F03C0 Unspecified dementia, severe, without behavioral disturbance, psychotic disturbance, mood disturbance, and anxiety: Secondary | ICD-10-CM | POA: Diagnosis present

## 2023-12-29 DIAGNOSIS — Z888 Allergy status to other drugs, medicaments and biological substances status: Secondary | ICD-10-CM

## 2023-12-29 DIAGNOSIS — Z885 Allergy status to narcotic agent status: Secondary | ICD-10-CM

## 2023-12-29 DIAGNOSIS — Z79899 Other long term (current) drug therapy: Secondary | ICD-10-CM

## 2023-12-29 DIAGNOSIS — Z9842 Cataract extraction status, left eye: Secondary | ICD-10-CM

## 2023-12-29 DIAGNOSIS — H9190 Unspecified hearing loss, unspecified ear: Secondary | ICD-10-CM | POA: Diagnosis not present

## 2023-12-29 DIAGNOSIS — F419 Anxiety disorder, unspecified: Secondary | ICD-10-CM | POA: Diagnosis present

## 2023-12-29 DIAGNOSIS — Z7951 Long term (current) use of inhaled steroids: Secondary | ICD-10-CM

## 2023-12-29 DIAGNOSIS — Z8551 Personal history of malignant neoplasm of bladder: Secondary | ICD-10-CM

## 2023-12-29 DIAGNOSIS — Z9841 Cataract extraction status, right eye: Secondary | ICD-10-CM

## 2023-12-29 DIAGNOSIS — Z981 Arthrodesis status: Secondary | ICD-10-CM

## 2023-12-29 DIAGNOSIS — Z96642 Presence of left artificial hip joint: Secondary | ICD-10-CM | POA: Diagnosis present

## 2023-12-29 HISTORY — DX: Unspecified dementia, unspecified severity, without behavioral disturbance, psychotic disturbance, mood disturbance, and anxiety: F03.90

## 2023-12-29 LAB — RESP PANEL BY RT-PCR (RSV, FLU A&B, COVID)  RVPGX2
Influenza A by PCR: NEGATIVE
Influenza B by PCR: NEGATIVE
Resp Syncytial Virus by PCR: NEGATIVE
SARS Coronavirus 2 by RT PCR: NEGATIVE

## 2023-12-29 LAB — CBC WITH DIFFERENTIAL/PLATELET
Abs Immature Granulocytes: 0.04 10*3/uL (ref 0.00–0.07)
Basophils Absolute: 0 10*3/uL (ref 0.0–0.1)
Basophils Relative: 0 %
Eosinophils Absolute: 0 10*3/uL (ref 0.0–0.5)
Eosinophils Relative: 0 %
HCT: 40.1 % (ref 39.0–52.0)
Hemoglobin: 12.6 g/dL — ABNORMAL LOW (ref 13.0–17.0)
Immature Granulocytes: 0 %
Lymphocytes Relative: 5 %
Lymphs Abs: 0.5 10*3/uL — ABNORMAL LOW (ref 0.7–4.0)
MCH: 30.7 pg (ref 26.0–34.0)
MCHC: 31.4 g/dL (ref 30.0–36.0)
MCV: 97.8 fL (ref 80.0–100.0)
Monocytes Absolute: 1.2 10*3/uL — ABNORMAL HIGH (ref 0.1–1.0)
Monocytes Relative: 12 %
Neutro Abs: 8.4 10*3/uL — ABNORMAL HIGH (ref 1.7–7.7)
Neutrophils Relative %: 83 %
Platelets: 267 10*3/uL (ref 150–400)
RBC: 4.1 MIL/uL — ABNORMAL LOW (ref 4.22–5.81)
RDW: 13 % (ref 11.5–15.5)
WBC: 10.2 10*3/uL (ref 4.0–10.5)
nRBC: 0 % (ref 0.0–0.2)

## 2023-12-29 LAB — COMPREHENSIVE METABOLIC PANEL
ALT: 20 U/L (ref 0–44)
AST: 31 U/L (ref 15–41)
Albumin: 3 g/dL — ABNORMAL LOW (ref 3.5–5.0)
Alkaline Phosphatase: 68 U/L (ref 38–126)
Anion gap: 9 (ref 5–15)
BUN: 32 mg/dL — ABNORMAL HIGH (ref 8–23)
CO2: 26 mmol/L (ref 22–32)
Calcium: 8.9 mg/dL (ref 8.9–10.3)
Chloride: 102 mmol/L (ref 98–111)
Creatinine, Ser: 1.66 mg/dL — ABNORMAL HIGH (ref 0.61–1.24)
GFR, Estimated: 41 mL/min — ABNORMAL LOW (ref >60)
Glucose, Bld: 156 mg/dL — ABNORMAL HIGH (ref 70–99)
Potassium: 3.7 mmol/L (ref 3.5–5.1)
Sodium: 137 mmol/L (ref 135–145)
Total Bilirubin: 1.2 mg/dL (ref 0.0–1.2)
Total Protein: 6.8 g/dL (ref 6.5–8.1)

## 2023-12-29 LAB — PROTIME-INR
INR: 2 — ABNORMAL HIGH (ref 0.8–1.2)
Prothrombin Time: 23.3 s — ABNORMAL HIGH (ref 11.4–15.2)

## 2023-12-29 LAB — PHOSPHORUS: Phosphorus: 3.2 mg/dL (ref 2.5–4.6)

## 2023-12-29 LAB — APTT: aPTT: 47 s — ABNORMAL HIGH (ref 24–36)

## 2023-12-29 LAB — BRAIN NATRIURETIC PEPTIDE: B Natriuretic Peptide: 585 pg/mL — ABNORMAL HIGH (ref 0.0–100.0)

## 2023-12-29 LAB — PROCALCITONIN: Procalcitonin: 0.45 ng/mL

## 2023-12-29 LAB — LACTIC ACID, PLASMA
Lactic Acid, Venous: 1.3 mmol/L (ref 0.5–1.9)
Lactic Acid, Venous: 1.1 mmol/L (ref 0.5–1.9)

## 2023-12-29 LAB — CBG MONITORING, ED: Glucose-Capillary: 112 mg/dL — ABNORMAL HIGH (ref 70–99)

## 2023-12-29 LAB — MAGNESIUM: Magnesium: 2.3 mg/dL (ref 1.7–2.4)

## 2023-12-29 LAB — TSH: TSH: 0.835 u[IU]/mL (ref 0.350–4.500)

## 2023-12-29 LAB — MRSA NEXT GEN BY PCR, NASAL: MRSA by PCR Next Gen: NOT DETECTED

## 2023-12-29 MED ORDER — LACTATED RINGERS IV BOLUS (SEPSIS)
1000.0000 mL | Freq: Once | INTRAVENOUS | Status: AC
  Administered 2023-12-29: 1000 mL via INTRAVENOUS

## 2023-12-29 MED ORDER — ACETAMINOPHEN 325 MG PO TABS
650.0000 mg | ORAL_TABLET | Freq: Four times a day (QID) | ORAL | Status: DC | PRN
  Administered 2023-12-30 (×2): 650 mg via ORAL
  Filled 2023-12-29 (×2): qty 2

## 2023-12-29 MED ORDER — LEVALBUTEROL HCL 0.63 MG/3ML IN NEBU
0.6300 mg | INHALATION_SOLUTION | Freq: Four times a day (QID) | RESPIRATORY_TRACT | Status: DC | PRN
  Administered 2024-01-03 – 2024-01-07 (×2): 0.63 mg via RESPIRATORY_TRACT
  Filled 2023-12-29 (×2): qty 3

## 2023-12-29 MED ORDER — ATORVASTATIN CALCIUM 20 MG PO TABS
20.0000 mg | ORAL_TABLET | Freq: Every day | ORAL | Status: DC
  Administered 2023-12-29 – 2024-01-07 (×10): 20 mg via ORAL
  Filled 2023-12-29 (×4): qty 1
  Filled 2023-12-29: qty 2
  Filled 2023-12-29 (×5): qty 1

## 2023-12-29 MED ORDER — IPRATROPIUM BROMIDE 0.02 % IN SOLN
0.5000 mg | Freq: Four times a day (QID) | RESPIRATORY_TRACT | Status: DC | PRN
  Administered 2024-01-07: 0.5 mg via RESPIRATORY_TRACT
  Filled 2023-12-29: qty 2.5

## 2023-12-29 MED ORDER — ACETAMINOPHEN 500 MG PO TABS
1000.0000 mg | ORAL_TABLET | Freq: Once | ORAL | Status: AC
  Administered 2023-12-29: 1000 mg via ORAL
  Filled 2023-12-29: qty 2

## 2023-12-29 MED ORDER — APIXABAN 5 MG PO TABS
5.0000 mg | ORAL_TABLET | Freq: Two times a day (BID) | ORAL | Status: DC
  Administered 2023-12-29 – 2024-01-05 (×16): 5 mg via ORAL
  Filled 2023-12-29 (×18): qty 1

## 2023-12-29 MED ORDER — METHYLPREDNISOLONE SODIUM SUCC 40 MG IJ SOLR
40.0000 mg | Freq: Two times a day (BID) | INTRAMUSCULAR | Status: DC
  Administered 2023-12-29 – 2023-12-30 (×4): 40 mg via INTRAVENOUS
  Filled 2023-12-29 (×4): qty 1

## 2023-12-29 MED ORDER — ENSURE ENLIVE PO LIQD
237.0000 mL | Freq: Two times a day (BID) | ORAL | Status: DC
  Administered 2023-12-30 (×2): 237 mL via ORAL

## 2023-12-29 MED ORDER — DILTIAZEM LOAD VIA INFUSION
10.0000 mg | Freq: Once | INTRAVENOUS | Status: AC
  Administered 2023-12-29: 10 mg via INTRAVENOUS
  Filled 2023-12-29: qty 10

## 2023-12-29 MED ORDER — CHLORHEXIDINE GLUCONATE CLOTH 2 % EX PADS
6.0000 | MEDICATED_PAD | Freq: Every day | CUTANEOUS | Status: DC
  Administered 2023-12-29 – 2024-01-04 (×6): 6 via TOPICAL

## 2023-12-29 MED ORDER — SERTRALINE HCL 50 MG PO TABS
50.0000 mg | ORAL_TABLET | Freq: Every morning | ORAL | Status: DC
  Administered 2023-12-29 – 2024-01-07 (×10): 50 mg via ORAL
  Filled 2023-12-29 (×10): qty 1

## 2023-12-29 MED ORDER — LEVALBUTEROL HCL 0.63 MG/3ML IN NEBU
0.6300 mg | INHALATION_SOLUTION | Freq: Once | RESPIRATORY_TRACT | Status: AC
Start: 2023-12-29 — End: 2023-12-29
  Administered 2023-12-29: 0.63 mg via RESPIRATORY_TRACT
  Filled 2023-12-29: qty 3

## 2023-12-29 MED ORDER — ONDANSETRON HCL 4 MG PO TABS: 4.0000 mg | ORAL_TABLET | Freq: Four times a day (QID) | ORAL | Status: DC | PRN

## 2023-12-29 MED ORDER — TRAZODONE HCL 50 MG PO TABS: 25.0000 mg | ORAL_TABLET | Freq: Every evening | ORAL | Status: DC | PRN

## 2023-12-29 MED ORDER — VERAPAMIL HCL ER 240 MG PO TBCR
240.0000 mg | EXTENDED_RELEASE_TABLET | Freq: Every morning | ORAL | Status: DC
  Administered 2023-12-30: 240 mg via ORAL
  Filled 2023-12-29 (×4): qty 1

## 2023-12-29 MED ORDER — HYDRALAZINE HCL 20 MG/ML IJ SOLN: 10.0000 mg | INTRAMUSCULAR | Status: DC | PRN

## 2023-12-29 MED ORDER — VERAPAMIL HCL ER 240 MG PO TBCR: 240.0000 mg | EXTENDED_RELEASE_TABLET | Freq: Every morning | ORAL | Status: DC

## 2023-12-29 MED ORDER — FLEET ENEMA RE ENEM: 1.0000 | ENEMA | Freq: Once | RECTAL | Status: DC | PRN

## 2023-12-29 MED ORDER — FINASTERIDE 5 MG PO TABS
5.0000 mg | ORAL_TABLET | Freq: Every morning | ORAL | Status: DC
  Administered 2023-12-29 – 2024-01-07 (×10): 5 mg via ORAL
  Filled 2023-12-29 (×10): qty 1

## 2023-12-29 MED ORDER — SODIUM CHLORIDE 0.9 % IV SOLN
2.0000 g | INTRAVENOUS | Status: DC
  Administered 2023-12-30 – 2024-01-01 (×3): 2 g via INTRAVENOUS
  Filled 2023-12-29 (×3): qty 20

## 2023-12-29 MED ORDER — SENNOSIDES-DOCUSATE SODIUM 8.6-50 MG PO TABS
1.0000 | ORAL_TABLET | Freq: Every evening | ORAL | Status: DC | PRN
  Administered 2024-01-02 – 2024-01-03 (×2): 1 via ORAL
  Filled 2023-12-29 (×2): qty 1

## 2023-12-29 MED ORDER — SODIUM CHLORIDE 0.9 % IV SOLN
500.0000 mg | INTRAVENOUS | Status: DC
  Administered 2023-12-29: 500 mg via INTRAVENOUS
  Filled 2023-12-29: qty 5

## 2023-12-29 MED ORDER — HALOPERIDOL LACTATE 5 MG/ML IJ SOLN
2.0000 mg | Freq: Four times a day (QID) | INTRAMUSCULAR | Status: DC | PRN
  Administered 2023-12-29 – 2024-01-02 (×3): 2 mg via INTRAVENOUS
  Filled 2023-12-29 (×3): qty 1

## 2023-12-29 MED ORDER — ACETAMINOPHEN 650 MG RE SUPP
650.0000 mg | Freq: Four times a day (QID) | RECTAL | Status: DC | PRN
  Filled 2023-12-29: qty 1

## 2023-12-29 MED ORDER — HYDROMORPHONE HCL 1 MG/ML IJ SOLN
0.5000 mg | INTRAMUSCULAR | Status: AC | PRN
Start: 2023-12-29 — End: ?

## 2023-12-29 MED ORDER — DILTIAZEM HCL 25 MG/5ML IV SOLN
10.0000 mg | Freq: Once | INTRAVENOUS | Status: AC
  Administered 2023-12-29: 10 mg via INTRAVENOUS
  Filled 2023-12-29: qty 5

## 2023-12-29 MED ORDER — BISACODYL 5 MG PO TBEC
5.0000 mg | DELAYED_RELEASE_TABLET | Freq: Every day | ORAL | Status: DC | PRN
  Administered 2024-01-04: 5 mg via ORAL
  Filled 2023-12-29: qty 1

## 2023-12-29 MED ORDER — TRAZODONE HCL 50 MG PO TABS
50.0000 mg | ORAL_TABLET | Freq: Every day | ORAL | Status: DC
  Administered 2023-12-29 – 2024-01-06 (×9): 50 mg via ORAL
  Filled 2023-12-29 (×9): qty 1

## 2023-12-29 MED ORDER — DILTIAZEM HCL-DEXTROSE 125-5 MG/125ML-% IV SOLN (PREMIX)
5.0000 mg/h | INTRAVENOUS | Status: DC
  Administered 2023-12-29 (×2): 15 mg/h via INTRAVENOUS
  Administered 2023-12-29: 5 mg/h via INTRAVENOUS
  Administered 2023-12-30: 13.5 mg/h via INTRAVENOUS
  Administered 2023-12-30 – 2023-12-31 (×3): 15 mg/h via INTRAVENOUS
  Filled 2023-12-29 (×7): qty 125

## 2023-12-29 MED ORDER — AMIODARONE HCL IN DEXTROSE 360-4.14 MG/200ML-% IV SOLN: 60.0000 mg/h | INTRAVENOUS | Status: DC

## 2023-12-29 MED ORDER — IOHEXOL 350 MG/ML SOLN
60.0000 mL | Freq: Once | INTRAVENOUS | Status: AC | PRN
  Administered 2023-12-29: 60 mL via INTRAVENOUS

## 2023-12-29 MED ORDER — SODIUM CHLORIDE 0.9 % IV SOLN
2.0000 g | Freq: Once | INTRAVENOUS | Status: AC
  Administered 2023-12-29: 2 g via INTRAVENOUS
  Filled 2023-12-29: qty 20

## 2023-12-29 MED ORDER — ONDANSETRON HCL 4 MG/2ML IJ SOLN: 4.0000 mg | Freq: Four times a day (QID) | INTRAMUSCULAR | Status: DC | PRN

## 2023-12-29 MED ORDER — SODIUM CHLORIDE 0.9 % IV SOLN
500.0000 mg | INTRAVENOUS | Status: DC
  Administered 2023-12-30 – 2023-12-31 (×2): 500 mg via INTRAVENOUS
  Filled 2023-12-29 (×2): qty 5

## 2023-12-29 MED ORDER — AMIODARONE HCL IN DEXTROSE 360-4.14 MG/200ML-% IV SOLN: 30.0000 mg/h | INTRAVENOUS | Status: DC

## 2023-12-29 MED ORDER — LACTATED RINGERS IV BOLUS
1000.0000 mL | Freq: Once | INTRAVENOUS | Status: AC
Start: 2023-12-29 — End: 2023-12-29
  Administered 2023-12-29: 1000 mL via INTRAVENOUS

## 2023-12-29 MED ORDER — HEPARIN SODIUM (PORCINE) 5000 UNIT/ML IJ SOLN: 5000.0000 [IU] | Freq: Three times a day (TID) | INTRAMUSCULAR | Status: DC

## 2023-12-29 MED ORDER — AMIODARONE LOAD VIA INFUSION: 150.0000 mg | Freq: Once | INTRAVENOUS | Status: DC

## 2023-12-29 MED ORDER — FLUTICASONE FUROATE-VILANTEROL 200-25 MCG/ACT IN AEPB
1.0000 | INHALATION_SPRAY | Freq: Every day | RESPIRATORY_TRACT | Status: DC
  Administered 2023-12-29 – 2024-01-05 (×6): 1 via RESPIRATORY_TRACT
  Filled 2023-12-29 (×2): qty 28

## 2023-12-29 MED ORDER — LACTATED RINGERS IV SOLN
150.0000 mL/h | INTRAVENOUS | Status: DC
Start: 2023-12-29 — End: 2023-12-29
  Administered 2023-12-29: 150 mL/h via INTRAVENOUS

## 2023-12-29 MED ORDER — VERAPAMIL HCL 2.5 MG/ML IV SOLN
2.5000 mg | Freq: Once | INTRAVENOUS | Status: DC
  Filled 2023-12-29: qty 1

## 2023-12-29 MED ORDER — ALBUTEROL SULFATE HFA 108 (90 BASE) MCG/ACT IN AERS: 2.0000 | INHALATION_SPRAY | Freq: Four times a day (QID) | RESPIRATORY_TRACT | Status: DC

## 2023-12-29 MED ORDER — OXYCODONE HCL 5 MG PO TABS: 5.0000 mg | ORAL_TABLET | ORAL | Status: DC | PRN

## 2023-12-29 MED ORDER — SODIUM CHLORIDE 0.9% FLUSH
3.0000 mL | Freq: Two times a day (BID) | INTRAVENOUS | Status: DC
  Administered 2023-12-29 – 2024-01-06 (×14): 3 mL via INTRAVENOUS

## 2023-12-29 MED ORDER — SODIUM CHLORIDE 0.9% FLUSH
3.0000 mL | Freq: Two times a day (BID) | INTRAVENOUS | Status: DC
  Administered 2023-12-29 – 2024-01-06 (×18): 3 mL via INTRAVENOUS

## 2023-12-29 NOTE — Assessment & Plan Note (Addendum)
 Currently in A-fib with RVR Now persistent atrial fibrillation, chronically anticoagulated with Eliquis Home rate controlled medication verapamil -Currently started on Cardizem drip, will taper off

## 2023-12-29 NOTE — ED Notes (Signed)
 ED Provider at bedside.

## 2023-12-29 NOTE — Assessment & Plan Note (Signed)
-  Chronic history of thoracic ascending aortic aneurysm -Based on today's CTA 4.7 cm no changes -Follow-up as an outpatient and monitor closely

## 2023-12-29 NOTE — H&P (Addendum)
 History and Physical   Patient: Kyle Owen                            PCP: Sheryle Carwin, MD                    DOB: 1941-10-02            DOA: 12/29/2023 FMW:[id number]             DOS: 12/29/2023, 11:58 AM  Sheryle Carwin, MD  Patient coming from:   HOME  I have personally reviewed patient's medical records, in electronic medical records, including:   link, and care everywhere.    Chief Complaint:   Chief Complaint  Patient presents with   Shortness of Breath    Hx Afib, dementia    History of present illness:    Kyle Owen is a-year-old male with extensive history of advanced dementia, chronic A-fib on Eliquis , BPH, prostate cancer, COPD to dependent 4 L, depression, TIAs, HLD, HTN, OA, DJD, old left pulmonary nodule, chronic thoracic aneurysm.... Presenting today with progressive shortness of breath, hypoxia on 2-4 L satting 88%... Improved to 90% on 6 L, also complaining of palpitation and dizziness.   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    Patient Denies having: Fever, Chills, Cough, SOB, Chest Pain, Abd pain, N/V/D, headache, dizziness, lightheadedness,  Dysuria, Joint pain, rash, open wounds    Review of Systems: As per HPI, otherwise 10 point review of systems were negative.    ----------------------------------------------------------------------------------------------------------------------  Allergies  Allergen Reactions   Flomax [Tamsulosin Hcl] Nausea And Vomiting and Other (See Comments)    Pt thought he was dying  DIZZY   Fentanyl  Other (See Comments)    Makes me combative  Pt states he's had it since then and was fine 10-19-23    Home MEDs:  Prior to Admission medications   Medication Sig Start Date End Date Taking? Authorizing Provider  acetaminophen  (TYLENOL ) 500 MG tablet Take 500 mg by mouth in the morning and at bedtime.    [provider]  atorvastatin  (LIPITOR) 20 MG tablet TAKE 1 TABLET BY MOUTH DAILY. 05/09/18   Jeffrie Oneil BROCKS, MD  budesonide -formoterol  (SYMBICORT ) 160-4.5 MCG/ACT inhaler INHALE 2 PUFFS INTO THE LUNGS TWICE DAILY. 11/09/23   Darlean Ozell NOVAK, MD  cetirizine (ZYRTEC) 10 MG tablet Take 10 mg by mouth daily.    [provider]  Cholecalciferol  (VITAMIN D ) 50 MCG (2000 UT) tablet Take 2,000 Units by mouth in the morning.    [provider]  ELIQUIS  5 MG TABS tablet Take 5 mg by mouth 2 (two) times daily. 02/10/22   [provider]  finasteride  (PROSCAR ) 5 MG tablet Take 5 mg by mouth in the morning.    [provider]  ipratropium-albuterol  (DUONEB) 0.5-2.5 (3) MG/3ML SOLN Take  3 mLs by nebulization every 6 (six) hours as needed (as needed for shortness of breath or wheezing). 10/21/23 11/20/23  Arrien, Elidia Sieving, MD  Magnesium  250 MG TABS Take 250 mg by mouth in the morning and at bedtime.    [provider]  OXYGEN Inhale 2.5 L into the lungs at bedtime.    [provider]  sertraline  (ZOLOFT ) 50 MG tablet Take 50 mg by mouth every morning.    [provider]  VENTOLIN  HFA 108 (90 BASE) MCG/ACT inhaler Inhale 2 puffs into the lungs Every 4 hours as needed for wheezing or shortness of breath.  09/24/12   [provider]  verapamil  (CALAN -SR)  240 MG CR tablet Take 240 mg by mouth in the morning.    [provider]    PRN MEDs: acetaminophen  **OR** acetaminophen , bisacodyl , hydrALAZINE , HYDROmorphone  (DILAUDID ) injection, ipratropium, levalbuterol , ondansetron  **OR** ondansetron  (ZOFRAN ) IV, oxyCODONE , senna-docusate, sodium phosphate , traZODone   Past Medical History:  Diagnosis Date   A-fib (HCC)    Anticoagulant long-term use    eliquis --- managed by cardiology   Benign prostatic hypertrophy with urinary frequency    Bladder cancer (HCC)    Chronic respiratory failure with hypoxia (HCC)    Complication of anesthesia    gets combative in recovery   COPD with chronic bronchitis and emphysema (HCC)    pulmonologist--- dr jude;  nocturnal oxygen,  no daytime oxygen use   Dementia (HCC)    Dependence on nocturnal oxygen therapy    Depression    Dyspnea    when walking   History of colon polyps    History of kidney stones    History of loop recorder    loop recorder in place battery is dead has not had changed due to covid   History of TIA (transient ischemic attack)    (per pt no residual ) per neurologist note (dr margaret 11-08-2015) dx cryptogenic TIA versus arrhythia versus dysautonomia (right ophthalmic artery TIA and x3 posterior circulation TIAs   Hyperlipidemia    Hypertension    Pt denies   Malignant neoplasm of overlapping sites of bladder [facility])    urologist--- dr matilda   Multinodular thyroid     per pathology report 11-12-2014 bilateral thyroid   benign multinoduler follicular adenoma and hyperplastic    Nephrolithiasis    OA (osteoarthritis)    DJD right knee   Ocular migraine    controlled w/ verapamil    On home oxygen therapy    PAF (paroxysmal atrial fibrillation) (HCC)    followed by AFIB clinic-- cardiologist-- dr jeffrie   Pneumonia    2023   Pulmonary nodule, left    left lower lobe x2 per CT 01-20-2016   Renal cyst, left    Stroke [facility])    Thoracic aortic aneurysm without rupture  [facility])    followed by dr fleeta tright ;   ascending -- ct chest 01-24-2023   4.8cm   Wears hearing aid in both ears    wear at times    Past Surgical History:  Procedure Laterality Date   ANTERIOR CERVICAL DECOMP/DISCECTOMY FUSION N/A 04/03/2017   Procedure: Cervical three-four, Cervical four-five Anterior cervical decompression/discectomy/fusion;  Surgeon: Fairy Levels, MD;  Location: [facility] OR;  Service: Neurosurgery;  Laterality: N/A;   CATARACT EXTRACTION W/ INTRAOCULAR LENS IMPLANT Right 2016   CATARACT EXTRACTION W/PHACO  12/16/2012   Procedure: CATARACT EXTRACTION PHACO AND INTRAOCULAR LENS PLACEMENT (IOC);  Surgeon: Cherene Mania, MD;  Location: [facility] ORS;  Service: Ophthalmology;  Laterality: Left;  CDE:17.31   COLONOSCOPY  10/03/2011   Procedure: COLONOSCOPY;  Surgeon: Oneil DELENA Budge;  Location: [facility];  Service: Gastroenterology;  Laterality: N/A;   CYSTOSCOPY W/ RETROGRADES Bilateral 04/12/2023   Procedure: CYSTOSCOPY WITH RETROGRADE PYELOGRAM;  Surgeon: Matilda Senior, MD;  Location: [facility] ORS;  Service: Urology;  Laterality: Bilateral;   CYSTOSCOPY W/ RETROGRADES Bilateral 11/12/2023   Procedure: CYSTOSCOPY WITH RETROGRADE PYELOGRAM;  Surgeon: Matilda Senior, MD;  Location: [facility];  Service: Urology;  Laterality: Bilateral;   CYSTOSCOPY WITH BIOPSY N/A 04/12/2023   Procedure: CYSTOSCOPY WITH BLADDER BIOPSY;  Surgeon: Matilda Senior, MD;  Location: [facility];  Service: Urology;  Laterality: N/A;  45 MINS   CYSTOSCOPY WITH BIOPSY N/A 11/12/2023   Procedure: CYSTOSCOPY WITH BLADDER  BIOPSY AND PROSTATIC URETHRA;  Surgeon: Matilda Senior, MD;  Location: [facility];  Service: Urology;  Laterality: N/A;   CYSTOSCOPY/RETROGRADE/URETEROSCOPY/STONE EXTRACTION WITH BASKET Right 03/29/2020   Procedure: CYSTOSCOPY/RETROGRADE/URETEROSCOPY/STONE EXTRACTION WITH BASKET;  Surgeon: Matilda Senior, MD;  Location: [facility];  Service: Urology;  Laterality: Right;  1 HR   DECOMPRESSIOIN ULNAR NERVE  AND CUBITAL TUNNEL RELEASE Left 07/14/2009   elbow   EP IMPLANTABLE DEVICE N/A 08/10/2015   MDT ILR implanted by Dr Kelsie for cryptogenic stroke   HOLMIUM LASER APPLICATION Right 03/29/2020   Procedure: HOLMIUM LASER APPLICATION;  Surgeon: Matilda Senior, MD;  Location: [facility];  Service: Urology;  Laterality: Right;   INGUINAL HERNIA REPAIR Right 1990's   KNEE ARTHROSCOPY Right 2015   POSTERIOR LUMBAR FUSION     2014   L3--4;    12/ 2018   L4--5   SHOULDER ARTHROSCOPY Left 1990's   TEE WITHOUT CARDIOVERSION N/A 07/27/2015   Procedure: TRANSESOPHAGEAL ECHOCARDIOGRAM (TEE);  Surgeon: Redell GORMAN Shallow, MD;  Location: [facility];  Service: Cardiovascular;  Laterality: N/A;   normal LV function, ef 55-60%,  mild AR and MR, mild dilated ascending aorta (4.2cm),  mild to moderate atherosclerosis  descending aorta,  mild to moderate TR,  negative saline microcavitation study   THYROIDECTOMY Right 01/20/2015   Procedure: RIGHT THYROIDECTOMY;  Surgeon: Ana LELON Moccasin, MD;  Location: [facility] OR;  Service: ENT;  Laterality: Right;   TONSILLECTOMY  as child   TOTAL HIP ARTHROPLASTY Left 12/14/2020   Procedure: TOTAL HIP ARTHROPLASTY ANTERIOR APPROACH;  Surgeon: Beverley Evalene BIRCH, MD;  Location: [facility];  Service: Orthopedics;  Laterality: Left;   TRANSURETHRAL RESECTION OF BLADDER TUMOR N/A 05/15/2016   Procedure: TRANSURETHRAL RESECTION OF BLADDER TUMOR (TURBT) AND INSTILLATION OF EPIRUBICIN ;  Surgeon: Senior Matilda, MD;  Location: [facility];  Service: Urology;  Laterality: N/A;   TRANSURETHRAL RESECTION OF BLADDER TUMOR N/A 03/29/2020   Procedure: TRANSURETHRAL RESECTION OF BLADDER TUMOR (TURBT);  Surgeon: Matilda Senior, MD;  Location: [facility];  Service: Urology;  Laterality: N/A;   TRANSURETHRAL RESECTION OF BLADDER TUMOR WITH MITOMYCIN -C N/A 01/23/2022   Procedure: TRANSURETHRAL RESECTION OF BLADDER TUMOR WITH POST OPERATIVE GEMCITABINE  INSTILLATION;  Surgeon: Matilda Senior,  MD;  Location: [facility];  Service: Urology;  Laterality: N/A;   URETEROSCOPY Left 11/12/2023   Procedure: URETEROSCOPY;  Surgeon: Matilda Senior, MD;  Location: [facility];  Service: Urology;  Laterality: Left;     reports that he quit smoking about 13 years ago. His smoking use included cigarettes. He started smoking about 58 years ago. He has a 45 pack-year smoking history. He has never used smokeless tobacco. He reports that he does not drink alcohol and does not use drugs.  Family History  Problem Relation Age of Onset   Stroke Sister    Breast cancer Sister    Stroke Brother     Physical Exam:   Vitals:   12/29/23 1000 12/29/23 1045 12/29/23 1110 12/29/23 1115  BP: (!) 133/101 130/67  (!) 139/98  Pulse: (!) 130 (!) 135 (!) 131 (!) 129  Resp: (!) 28 (!) 26 (!) 30 (!) 27  Temp:      TempSrc:      SpO2: (!) 85% (!) 78% 91% (!) 83%  Weight:      Height:       Constitutional: In respiratory distress, shortness of breath, requiring 6 L, pleasantly confused otherwise awake Eyes: PERRL, lids and conjunctivae normal ENMT: Mucous membranes are moist. Posterior pharynx clear of any exudate or lesions.Normal dentition.  Neck: normal, supple, no masses, no thyromegaly Respiratory: Mildly labored breath sounds, tachypneic, mild diffuse wheezing, with rhonchi, negative any crackles Cardiovascular: Irregularly irregular no murmurs / rubs / gallops. No extremity edema. 2+ pedal pulses. No carotid bruits.  Abdomen: no tenderness, no masses palpated. No hepatosplenomegaly. Bowel sounds positive.  Musculoskeletal: no clubbing / cyanosis. No joint deformity upper and lower extremities. Good ROM, no contractures. Normal muscle tone.  Neurologic: Pleasantly confused CN II-XII grossly intact. Sensation intact, DTR normal. Strength 5/5 in all 4.  Psychiatric: Poor insight, pleasantly confused Skin: no rashes, lesions, ulcers. No induration       Labs on admission:    I have personally  reviewed following labs and imaging studies  CBC: Recent Labs  Lab 12/29/23 0215  WBC 10.2  NEUTROABS 8.4*  HGB 12.6*  HCT 40.1  MCV 97.8  PLT 267   Basic Metabolic Panel: Recent Labs  Lab 12/29/23 0215  NA 137  K 3.7  CL 102  CO2 26  GLUCOSE 156*  BUN 32*  CREATININE 1.66*  CALCIUM  8.9  MG 2.3  PHOS 3.2   GFR: Estimated Creatinine Clearance: 37 mL/min (A) (by C-G formula based on SCr of 1.66 mg/dL (H)). Liver Function Tests: Recent Labs  Lab 12/29/23 0215  AST 31  ALT 20  ALKPHOS 68  BILITOT 1.2  PROT 6.8  ALBUMIN 3.0*    Coagulation Profile: Recent Labs  Lab 12/29/23 0215  INR 2.0*    CBG: Recent Labs  Lab 12/29/23 0819  GLUCAP 112*    Urine analysis:    Component Value Date/Time   COLORURINE YELLOW 06/13/2016 1120   APPEARANCEUR Clear 12/04/2023 1539   LABSPEC 1.020 06/13/2016 1120   PHURINE 5.5 06/13/2016 1120   GLUCOSEU Negative 12/04/2023 1539   HGBUR NEGATIVE 06/13/2016 1120   BILIRUBINUR Negative 12/04/2023 1539   KETONESUR NEGATIVE 06/13/2016 1120   PROTEINUR Negative 12/04/2023 1539   PROTEINUR NEGATIVE 06/13/2016 1120   UROBILINOGEN negative (A) 05/04/2020 0930   NITRITE Negative 12/04/2023 1539   NITRITE NEGATIVE 06/13/2016 1120   LEUKOCYTESUR Trace (A) 12/04/2023 1539    Last A1C:  Lab Results  Component Value Date   HGBA1C 6.0 (H) 06/09/2015     Radiologic Exams on Admission:   CT Angio Chest PE W and/or Wo Contrast Result Date: 12/29/2023 CLINICAL DATA:  83 year old male with history of shortness of breath. Low oxygen saturation. Evaluate for pulmonary embolism. EXAM: CT ANGIOGRAPHY CHEST WITH CONTRAST TECHNIQUE: Multidetector CT imaging of the chest was performed using the standard protocol during bolus administration of intravenous contrast. Multiplanar CT image reconstructions and MIPs were obtained to evaluate the vascular anatomy. RADIATION DOSE REDUCTION: This exam  was performed according to the departmental  dose-optimization program which includes automated exposure control, adjustment of the mA and/or kV according to patient size and/or use of iterative reconstruction technique. CONTRAST:  60mL OMNIPAQUE  IOHEXOL  350 MG/ML SOLN COMPARISON:  Chest CT 11/02/2023. FINDINGS: Comment: Today's study is severely limited for assessment of pulmonary embolism by suboptimal contrast bolus, excessive image artifact from the patient being imaged with the arms in the down position, and extensive patient respiratory motion. Cardiovascular: Within the limitations of today's examination, there is no central or lobar sized filling defect to suggest large pulmonary embolism. Unfortunately, secondary to the limitations of today's examination, segmental and subsegmental sized filling defects can not entirely be excluded. Heart size is normal. There is no significant pericardial fluid, thickening or pericardial calcification. There is aortic atherosclerosis, as well as atherosclerosis of the great vessels of the mediastinum and the coronary arteries, including calcified atherosclerotic plaque in the left main, left anterior descending, left circumflex and right coronary arteries. Thickening and calcification of the aortic valve. Aneurysmal dilatation of the ascending thoracic aorta (4.7 cm in diameter), unchanged. Mediastinum/Nodes: No pathologically enlarged mediastinal or hilar lymph nodes. Esophagus is unremarkable in appearance. No axillary lymphadenopathy. Lungs/Pleura: Assessment of the lung parenchyma is substantially limited by patient respiratory motion. However, there are clearly widespread areas of thickening of the peribronchovascular interstitium with extensive peribronchovascular ground-glass attenuation and consolidative changes scattered throughout the lungs bilaterally, indicative of widespread bilateral multilobar bronchopneumonia, most severe in the right upper and right lower lobes. No pleural effusions. Upper Abdomen:  Aortic atherosclerosis. Exophytic 3 cm low-attenuation lesion in the upper pole of the left kidney, incompletely characterized on today's noncontrast CT examination, but statistically likely cysts (no imaging follow-up recommended). Numerous calcified granulomas in the spleen incidentally noted. Musculoskeletal: There are no aggressive appearing lytic or blastic lesions noted in the visualized portions of the skeleton. Review of the MIP images confirms the above findings. IMPRESSION: 1. Severely limited examination demonstrating no central or lobar sized pulmonary embolism. Accurate assessment for smaller segmental and subsegmental sized emboli is not possible on the basis of today's examination. 2. Severe multilobar bilateral bronchopneumonia, as above. 3. Aortic atherosclerosis, in addition to left main and three-vessel coronary artery disease. Aneurysmal dilatation of the ascending thoracic aorta (4.7 cm in diameter), similar to the prior study. Ascending thoracic aortic aneurysm. Recommend semi-annual imaging followup by CTA or MRA and referral to cardiothoracic surgery if not already obtained. This recommendation follows 2010 ACCF/AHA/AATS/ACR/ASA/SCA/SCAI/SIR/STS/SVM Guidelines for the Diagnosis and Management of Patients With Thoracic Aortic Disease. Circulation. 2010; 121: Z733-z630. Aortic aneurysm NOS (ICD10-I71.9). Aortic Atherosclerosis (ICD10-I70.0). Aortic aneurysm NOS (ICD10-I71.9). Electronically Signed   By: Toribio Aye M.D.   On: 12/29/2023 07:13   DG Chest Portable 1 View Result Date: 12/29/2023 CLINICAL DATA:  Shortness of breath, palpitations EXAM: PORTABLE CHEST 1 VIEW COMPARISON:  10/19/2023 FINDINGS: Heart and mediastinal contours within normal limits. Loop recorder device in place. Diffuse interstitial prominence in the lower lobes. No effusions or acute bony abnormality. IMPRESSION: Diffuse interstitial prominence in the lower lobes could reflect edema or infection. Electronically  Signed   By: Franky Crease M.D.   On: 12/29/2023 03:16    EKG:   Independently reviewed.  Orders placed or performed during the hospital encounter of 12/29/23   EKG 12-Lead   EKG 12-Lead   EKG 12-Lead   ---------------------------------------------------------------------------------------------------------------------------------------    Assessment / Plan:   Principal Problem:   Sepsis [facility]) Active Problems:   Paroxysmal atrial fibrillation (HCC)  Acute on chronic respiratory failure with hypoxia (HCC)   Thoracic ascending aortic aneurysm (HCC)   Advanced dementia (HCC)   Chronic kidney disease, stage 3b (HCC)   COPD with chronic bronchitis and emphysema (HCC)   Chronic respiratory failure with hypoxia (HCC)   Anxiety   Hyperlipidemia   Assessment and Plan: * Sepsis (HCC) Blood pressure (!) 141/114, pulse (!) 130, temperature 97.7 F (36.5 C), RR (!) 22, height 6' (1.829 m), weight 87.5 kg, SpO2 90% on 6L   Labs: Glucose 156, BUN 32, creatinine 1.66, albumin 3.0, BNP 585.0, WBC 10.2,  Meeting SIRS criteria -Ruling out sepsis -pending lactic acid,  Procalcitonin level -Source of infection multifocal pneumonia  -2 sets of blood cultures -Will try to obtain sputum cultures  CT angiogram multiple artifact negative for any obvious pulmonary embolism, multilobar pneumonia , 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 Chest x-ray: Diffuse superficial prominence in the lower lobe could reflect edema or infection  Initiating sepsis protocol, continuing IV fluid with LR, continue current antibiotics of IV Rocephin  and azithromycin   Will follow-up with cultures     Acute on chronic respiratory failure with hypoxia (HCC) Due to multifocal pneumonia and COPD exacerbation -Continue with current antibiotics -Bronchodilators -IV steroids  Paroxysmal atrial fibrillation (HCC) Currently in A-fib with RVR Now persistent atrial fibrillation, chronically anticoagulated with  Eliquis  Home rate controlled medication verapamil  -Currently started on Cardizem  drip, will taper off  Advanced dementia (HCC) With behavior disturbances currently not on any chronic medication with exception of sertraline  Will utilize as needed Haldol  versus Ativan  Thoracic ascending aortic aneurysm (HCC) -Chronic history of thoracic ascending aortic aneurysm -Based on today's CTA 4.7 cm no changes -Follow-up as an outpatient and monitor closely  Chronic kidney disease, stage 3b (HCC) Monitoring BUN/creatinine closely currently at baseline Waiting nephrotoxins-will adjust medication renally  Hyperlipidemia Will continue statins  Anxiety As needed Haldol /Ativan  Chronic respiratory failure with hypoxia (HCC) History of chronic respiratory failure with COPD, O2 dependent 4 L, currently requiring 6 L Continue treating underlying disease of multilobar pneumonia Continue bronchodilators scheduled and as needed Evaluating for need of steroids  COPD with chronic bronchitis and emphysema (HCC) COPD with chronic respiratory failure, O2 dependent 4 L at baseline Management as above        Consults called:  cardiology  -------------------------------------------------------------------------------------------------------------------------------------------- DVT prophylaxis:  TED hose Start: 12/29/23 0750 SCDs Start: 12/29/23 0750 apixaban  (ELIQUIS ) tablet 5 mg   Code Status:   Code Status: Limited: Do not attempt resuscitation (DNR) -DNR-LIMITED -Do Not Intubate/DNI    Admission status: Patient will be admitted as Inpatient, with a greater than 2 midnight length of stay. Level of care: ICU   Family Communication:  none at bedside  (The above findings and plan of care has been discussed with patient in detail, the patient expressed understanding and agreement of above plan)   --------------------------------------------------------------------------------------------------------------------------------------------------  Disposition Plan:  Anticipated 1-2 days Status is: Inpatient Remains inpatient appropriate because: Needing IV fluids, IV antibiotics, IV medication for rate control  ---------------------------------------------------------------------------------------------------------------------------------------------  Critical care time spent:  75  Min.  Was spent seeing and evaluating the patient, reviewing all medical records, drawn plan of care.  SIGNED: Adriana DELENA Grams, MD, FHM. FAAFP. [place] [facility], Pager  (Please use [email or web address] to page/ or secure chat through epic) If 7PM-7AM, please contact night-coverage [email or web address],  12/29/2023, 11:58 AM

## 2023-12-29 NOTE — Assessment & Plan Note (Signed)
 Due to multifocal pneumonia and COPD exacerbation -Continue with current antibiotics -Bronchodilators -IV steroids

## 2023-12-29 NOTE — Assessment & Plan Note (Signed)
 COPD with chronic respiratory failure, O2 dependent 4 L at baseline Management as above

## 2023-12-29 NOTE — Hospital Course (Addendum)
 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 Presenting today with progressive shortness of breath, hypoxia on 2-4 L satting 88%... Improved to 90% on 6 L, also complaining of palpitation and dizziness.   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 P.o. diltiazem  was reintroduced to help with heart rates. Ultimately, patient converted to sinus rhythm.  His amiodarone  was converted to po and his diltiazem  was converted to long acting. The patient remained in sinus rhythm or, rate controlled. For the remainder of the hospitalization, the patient did experience episodes of hospital delirium and sundowning.  However, the patient could be redirectable with reorientation.  The patient finished 7 days of antibiotics for his pneumonia.  He remained afebrile and hemodynamically stable. Regarding his respiratory failure, the patient did not experience any further shortness of breath or increased work of breathing. The  patient's oxygen saturation did fluctuate between 90-94% on 4 L nasal cannula.  There was some difficulty obtaining a correct oxygen saturation in part due to the patient's tremor, but the patient never had any increased work of breathing.  He was continued on bronchodilators.

## 2023-12-29 NOTE — ED Notes (Signed)
 Patient is unstable to walk at this time. Oxygen desat while sitting in chair. Patient will stand and pivot with two assist.

## 2023-12-29 NOTE — ED Notes (Signed)
 Patient given food tray

## 2023-12-29 NOTE — Assessment & Plan Note (Signed)
 History of chronic respiratory failure with COPD, O2 dependent 4 L, currently requiring 6 L Continue treating underlying disease of multilobar pneumonia Continue bronchodilators scheduled and as needed Evaluating for need of steroids

## 2023-12-29 NOTE — Progress Notes (Signed)
 Patient continues to try to get out of the bed and has set the bed alarm off multiple times since admission. He also attempted to pull out one of his Ivs. All this while his wife was in the room. I secure chatted Dr. Willette requesting medication for agitation or soft wrist restraints.

## 2023-12-29 NOTE — Plan of Care (Signed)
  Problem: Fluid Volume: Goal: Hemodynamic stability will improve Outcome: Progressing   Problem: Clinical Measurements: Goal: Diagnostic test results will improve Outcome: Progressing   Problem: Education: Goal: Knowledge of General Education information will improve Description: Including pain rating scale, medication(s)/side effects and non-pharmacologic comfort measures Outcome: Progressing   Problem: Health Behavior/Discharge Planning: Goal: Ability to manage health-related needs will improve Outcome: Progressing

## 2023-12-29 NOTE — ED Triage Notes (Signed)
 Pt BIB [facility] from home, spouse called EMS after noticing pt was SOB on O2 4L at home which is pt baseline- O2 88% upon EMS arrival- O2 increased to 6L with sats in upper 90's. Pt reported palpitations and dizziness to EMS

## 2023-12-29 NOTE — Progress Notes (Signed)
 Patient arrived to his room. VSS except HR was elevated. Wife in room with patient. Bed alarm on and call bell within reach.

## 2023-12-29 NOTE — Assessment & Plan Note (Signed)
 Monitoring BUN/creatinine closely currently at baseline Waiting nephrotoxins-will adjust medication renally

## 2023-12-29 NOTE — Sepsis Progress Note (Addendum)
 Sepsis protocol is being followed by [facility]. Antibiotics given prior to initiating sepsis protocol.

## 2023-12-29 NOTE — Assessment & Plan Note (Signed)
-  Will continue statins

## 2023-12-29 NOTE — ED Notes (Signed)
 ED Provider at bedside talking with pt spouse

## 2023-12-29 NOTE — ED Notes (Signed)
 MD notified of patient BP 107/54, and pulse of 130-128. Cardizem at 7.5. see new orders.

## 2023-12-29 NOTE — ED Provider Notes (Signed)
 Emergency Department Provider Note  TRIAGE NOTE: Pt BIB [facility] from home, spouse called EMS after noticing pt was SOB on O2 4L at home which is pt baseline- O2 88% upon EMS arrival- O2 increased to 6L with sats in upper 90's. Pt reported palpitations and dizziness to EMS  HISTORY  Chief Complaint Shortness of Breath (Hx Afib, dementia)   HPI Kyle Owen is a 83 y.o. male with dementia, COPD, and paroxysmal atrial fibrillation on Eliquis  and verapamil  who presents the ER via EMS secondary to respiratory distress.  History obtained from paramedics at bedside initially and subsequently from daughter on the telephone and wife in person.  Patient is on 4 L at baseline has had pneumonia couple times in the past.  Soundly the last few days the wife was out of town and the patient had a decreased sleep increased cough and increased difficulty breathing but no fevers.  Did have a productive cough during this time which is abnormal for him.  He is actually had worsening productive cough for the last 1 to 2 weeks.  No known sick contacts.  His oxygen level was low at home so they called EMS who verified that it was below 90 on his baseline 4 L so they bumped him up to 6 and brought him here for further evaluation.  And route he was found to have A-fib with RVR so they tried a dose of diltiazem  which did not help.  Patient stable and route.  PMH Past Medical History:  Diagnosis Date   A-fib (HCC)    Anticoagulant long-term use    eliquis --- managed by cardiology   Benign prostatic hypertrophy with urinary frequency    Bladder cancer (HCC)    Chronic respiratory failure with hypoxia (HCC)    Complication of anesthesia    gets combative in recovery   COPD with chronic bronchitis and emphysema (HCC)    pulmonologist--- dr jude;  nocturnal oxygen,  no daytime oxygen use   Dementia (HCC)    Dependence on nocturnal oxygen therapy    Depression    Dyspnea    when walking   History of colon polyps     History of kidney stones    History of loop recorder    loop recorder in place battery is dead has not had changed due to covid   History of TIA (transient ischemic attack)    (per pt no residual ) per neurologist note (dr margaret 11-08-2015) dx cryptogenic TIA versus arrhythia versus dysautonomia (right ophthalmic artery TIA and x3 posterior circulation TIAs   Hyperlipidemia    Hypertension    Pt denies   Malignant neoplasm of overlapping sites of bladder [facility])    urologist--- dr matilda   Multinodular thyroid     per pathology report 11-12-2014 bilateral thyroid   benign multinoduler follicular adenoma and hyperplastic    Nephrolithiasis    OA (osteoarthritis)    DJD right knee   Ocular migraine    controlled w/ verapamil    On home oxygen therapy    PAF (paroxysmal atrial fibrillation) (HCC)    followed by AFIB clinic-- cardiologist-- dr jeffrie   Pneumonia    2023   Pulmonary nodule, left    left lower lobe x2 per CT 01-20-2016   Renal cyst, left    Stroke [facility])    Thoracic aortic aneurysm without rupture [facility])    followed by dr fleeta tright ;   ascending -- ct chest 01-24-2023   4.8cm  Wears hearing aid in both ears    wear at times    Home Medications Prior to Admission medications   Medication Sig Start Date End Date Taking? Authorizing Provider  acetaminophen  (TYLENOL ) 500 MG tablet Take 500 mg by mouth in the morning and at bedtime.    [provider]  atorvastatin  (LIPITOR) 20 MG tablet TAKE 1 TABLET BY MOUTH DAILY. 05/09/18   Jeffrie Oneil BROCKS, MD  budesonide -formoterol  (SYMBICORT ) 160-4.5 MCG/ACT inhaler INHALE 2 PUFFS INTO THE LUNGS TWICE DAILY. 11/09/23   Darlean Ozell NOVAK, MD  cetirizine (ZYRTEC) 10 MG tablet Take 10 mg by mouth daily.    [provider]  Cholecalciferol  (VITAMIN D ) 50 MCG (2000 UT) tablet Take 2,000 Units by mouth in the morning.    [provider]  ELIQUIS  5 MG TABS tablet Take 5 mg by mouth 2 (two) times daily. 02/10/22    [provider]  finasteride  (PROSCAR ) 5 MG tablet Take 5 mg by mouth in the morning.    [provider]  ipratropium-albuterol  (DUONEB) 0.5-2.5 (3) MG/3ML SOLN Take 3 mLs by nebulization every 6 (six) hours as needed (as needed for shortness of breath or wheezing). 10/21/23 11/20/23  Arrien, Elidia Sieving, MD  Magnesium  250 MG TABS Take 250 mg by mouth in the morning and at bedtime.    [provider]  OXYGEN Inhale 2.5 L into the lungs at bedtime.    [provider]  sertraline  (ZOLOFT ) 50 MG tablet Take 50 mg by mouth every morning.    [provider]  VENTOLIN  HFA 108 (90 BASE) MCG/ACT inhaler Inhale 2 puffs into the lungs Every 4 hours as needed for wheezing or shortness of breath.  09/24/12   [provider]  verapamil  (CALAN -SR) 240 MG CR tablet Take 240 mg by mouth in the morning.    [provider]    Social History Social History   Tobacco Use   Smoking status: Former    Current packs/day: 0.00    Average packs/day: 1 pack/day for 45.0 years (45.0 ttl pk-yrs)    Types: Cigarettes    Start date: 09/26/1965    Quit date: 09/26/2010    Years since quitting: 13.2   Smokeless tobacco: Never  Vaping Use   Vaping status: Never Used  Substance Use Topics   Alcohol use: No   Drug use: Never    Review of Systems: Documented in HPI ____________________________________________  PHYSICAL EXAM: VITAL SIGNS: ED Triage Vitals  Encounter Vitals Group     BP 12/29/23 0213 111/72     Systolic BP Percentile --      Diastolic BP Percentile --      Pulse Rate 12/29/23 0213 (!) 106     Resp 12/29/23 0213 (!) 25     Temp 12/29/23 0213 98.6 F (37 C)     Temp Source 12/29/23 0633 Oral     SpO2 12/29/23 0213 93 %     Weight 12/29/23 0213 194 lb 0.1 oz (88 kg)     Height 12/29/23 0213 6' 1 (1.854 m)   Physical Exam Vitals and nursing note reviewed.  Constitutional:      Appearance: He is well-developed.  HENT:      Head: Normocephalic and atraumatic.  Cardiovascular:     Rate and Rhythm: Tachycardia present. Rhythm irregular.  Pulmonary:     Effort: Respiratory distress present.     Breath sounds: Decreased breath sounds, wheezing and rales present.  Abdominal:  General: There is no distension.  Musculoskeletal:        General: Normal range of motion.     Cervical back: Normal range of motion.  Neurological:     Mental Status: He is alert.  Psychiatric:        Attention and Perception: He is inattentive.        Mood and Affect: Mood is anxious.     Comments: Speaking about things that are not present and have not happened.        ____________________________________________   LABS (all labs ordered are listed, but only abnormal results are displayed)  Labs Reviewed  CBC WITH DIFFERENTIAL/PLATELET - Abnormal; Notable for the following components:      Result Value   RBC 4.10 (*)    Hemoglobin 12.6 (*)    Neutro Abs 8.4 (*)    Lymphs Abs 0.5 (*)    Monocytes Absolute 1.2 (*)    All other components within normal limits  COMPREHENSIVE METABOLIC PANEL - Abnormal; Notable for the following components:   Glucose, Bld 156 (*)    BUN 32 (*)    Creatinine, Ser 1.66 (*)    Albumin 3.0 (*)    GFR, Estimated 41 (*)    All other components within normal limits  BRAIN NATRIURETIC PEPTIDE - Abnormal; Notable for the following components:   B Natriuretic Peptide 585.0 (*)    All other components within normal limits  CULTURE, BLOOD (SINGLE)  RESP PANEL BY RT-PCR (RSV, FLU A&B, COVID)  RVPGX2  EXPECTORATED SPUTUM ASSESSMENT W GRAM STAIN, RFLX TO RESP C  CULTURE, BLOOD (ROUTINE X 2)  CULTURE, BLOOD (ROUTINE X 2)  LACTIC ACID, PLASMA  MAGNESIUM   PHOSPHORUS  PROTIME-INR  TSH  LACTIC ACID, PLASMA  LACTIC ACID, PLASMA  PROTIME-INR  APTT   ____________________________________________  EKG   EKG Interpretation Date/Time:  Saturday December 29 2023 02:13:44 EST Ventricular Rate:   137 PR Interval:    QRS Duration:  91 QT Interval:  290 QTC Calculation: 438 R Axis:   90  Text Interpretation: Atrial fibrillation with rapid V-rate Ventricular premature complex Borderline right axis deviation Minimal ST depression, inferior leads Confirmed by Lorette Mayo [phone]) on 12/29/2023 7:56:01 AM        ____________________________________________  RADIOLOGY  CT Angio Chest PE W and/or Wo Contrast Result Date: 12/29/2023 CLINICAL DATA:  83 year old male with history of shortness of breath. Low oxygen saturation. Evaluate for pulmonary embolism. EXAM: CT ANGIOGRAPHY CHEST WITH CONTRAST TECHNIQUE: Multidetector CT imaging of the chest was performed using the standard protocol during bolus administration of intravenous contrast. Multiplanar CT image reconstructions and MIPs were obtained to evaluate the vascular anatomy. RADIATION DOSE REDUCTION: This exam was performed according to the departmental dose-optimization program which includes automated exposure control, adjustment of the mA and/or kV according to patient size and/or use of iterative reconstruction technique. CONTRAST:  60mL OMNIPAQUE  IOHEXOL  350 MG/ML SOLN COMPARISON:  Chest CT 11/02/2023. FINDINGS: Comment: Today's study is severely limited for assessment of pulmonary embolism by suboptimal contrast bolus, excessive image artifact from the patient being imaged with the arms in the down position, and extensive patient respiratory motion. Cardiovascular: Within the limitations of today's examination, there is no central or lobar sized filling defect to suggest large pulmonary embolism. Unfortunately, secondary to the limitations of today's examination, segmental and subsegmental sized filling defects can not entirely be excluded. Heart size is normal. There is no significant pericardial fluid, thickening or pericardial calcification. There is aortic atherosclerosis,  as well as atherosclerosis of the great vessels of the  mediastinum and the coronary arteries, including calcified atherosclerotic plaque in the left main, left anterior descending, left circumflex and right coronary arteries. Thickening and calcification of the aortic valve. Aneurysmal dilatation of the ascending thoracic aorta (4.7 cm in diameter), unchanged. Mediastinum/Nodes: No pathologically enlarged mediastinal or hilar lymph nodes. Esophagus is unremarkable in appearance. No axillary lymphadenopathy. Lungs/Pleura: Assessment of the lung parenchyma is substantially limited by patient respiratory motion. However, there are clearly widespread areas of thickening of the peribronchovascular interstitium with extensive peribronchovascular ground-glass attenuation and consolidative changes scattered throughout the lungs bilaterally, indicative of widespread bilateral multilobar bronchopneumonia, most severe in the right upper and right lower lobes. No pleural effusions. Upper Abdomen: Aortic atherosclerosis. Exophytic 3 cm low-attenuation lesion in the upper pole of the left kidney, incompletely characterized on today's noncontrast CT examination, but statistically likely cysts (no imaging follow-up recommended). Numerous calcified granulomas in the spleen incidentally noted. Musculoskeletal: There are no aggressive appearing lytic or blastic lesions noted in the visualized portions of the skeleton. Review of the MIP images confirms the above findings. IMPRESSION: 1. Severely limited examination demonstrating no central or lobar sized pulmonary embolism. Accurate assessment for smaller segmental and subsegmental sized emboli is not possible on the basis of today's examination. 2. Severe multilobar bilateral bronchopneumonia, as above. 3. Aortic atherosclerosis, in addition to left main and three-vessel coronary artery disease. Aneurysmal dilatation of the ascending thoracic aorta (4.7 cm in diameter), similar to the prior study. Ascending thoracic aortic aneurysm.  Recommend semi-annual imaging followup by CTA or MRA and referral to cardiothoracic surgery if not already obtained. This recommendation follows 2010 ACCF/AHA/AATS/ACR/ASA/SCA/SCAI/SIR/STS/SVM Guidelines for the Diagnosis and Management of Patients With Thoracic Aortic Disease. Circulation. 2010; 121: [id number]. Aortic aneurysm NOS (ICD10-I71.9). Aortic Atherosclerosis (ICD10-I70.0). Aortic aneurysm NOS (ICD10-I71.9). Electronically Signed   By: Toribio Aye M.D.   On: 12/29/2023 07:13   DG Chest Portable 1 View Result Date: 12/29/2023 CLINICAL DATA:  Shortness of breath, palpitations EXAM: PORTABLE CHEST 1 VIEW COMPARISON:  10/19/2023 FINDINGS: Heart and mediastinal contours within normal limits. Loop recorder device in place. Diffuse interstitial prominence in the lower lobes. No effusions or acute bony abnormality. IMPRESSION: Diffuse interstitial prominence in the lower lobes could reflect edema or infection. Electronically Signed   By: Franky Crease M.D.   On: 12/29/2023 03:16   ____________________________________________  PROCEDURES  Procedure(s) performed:   .Critical Care  Performed by: Lorette Mayo, MD Authorized by: Lorette Mayo, MD   Critical care provider statement:    Critical care time (minutes):  78   Critical care time was exclusive of:  Separately billable procedures and treating other patients   Critical care was necessary to treat or prevent imminent or life-threatening deterioration of the following conditions:  Cardiac failure, circulatory failure, CNS failure or compromise, shock, sepsis and respiratory failure   Critical care was time spent personally by me on the following activities:  Development of treatment plan with patient or surrogate, discussions with consultants, evaluation of patient's response to treatment, examination of patient, ordering and review of laboratory studies, ordering and review of radiographic studies, ordering and performing treatments and  interventions, pulse oximetry, re-evaluation of patient's condition, review of old charts and obtaining history from patient or surrogate  ____________________________________________  INITIAL IMPRESSION / ASSESSMENT AND PLAN   This patient presents to the ED for concern of dyspnea, this involves an extensive number of treatment options, and is a complaint that carries with it  a high risk of complications and morbidity.  Based on exam, history of present illness I think the most likely etiology is A-fib RVR with resultant fluid overload versus commune acquired pneumonia, however am considering pulmonary embolus, pneumothorax as well but thought to be less likely based on H&P and workup to this point.   Additional history obtained:  Additional history obtained from EMS and paramedics at bedside along with his current wife and daughters on the phone. Previous records obtained and reviewed in epic  Co morbidities that complicate the patient evaluation  Copd Dementia afib  Social Determinants of Health:  At home with wife  Initial Plan:  With his increased sputum will cover with antibiotics but picture more c/w pulm edema as he doesn't have a fever and has crackles/wheezing.  Screening labs including CBC and Metabolic panel to evaluate for infectious or metabolic etiology of disease.  Urinalysis with reflex culture ordered to evaluate for UTI or relevant urologic/nephrologic pathology.  CXR to evaluate for structural/infectious intrathoracic pathology.  EKG to evaluate for cardiac pathology. Objective evaluation as below reviewed with plan for close reassessment  ED Course  Images ordered viewed and obtained by myself. Agree with Radiology interpretation. Details in ED course.  Labs ordered reviewed by myself as detailed in ED course.  Consultations obtained/considered detailed in ED course.  -X-ray reviewed and interpreted by myself with some interstitial changes that look most  consistent with pulmonary edema but no large pleural effusions or consolidative areas to suggest pneumonia.  As he does not have a fever and does have elevated BNP we will hold off on fluids.  Will work on getting rate better and see if that improves things.  Also try Xopenex  since he does have some wheezing and history of COPD. -Patient maxed out on diltiazem  without any changes heart rate.  He has a history of A-fib but not necessarily with rapid ventricular response.  Consider possible PE in this scenario.  Will get CT scan to evaluate for that which also should give us  a better look at his lungs.  Overall patient stable. -CT scan done viewed interpreted by myself he has a known aortic aneurysm on my interpretation and also has multilobar pneumonia.  Unfortunately patient already has had antibiotics but will get cultures now and add on a lactic acid and some fluids as well.  Oral temperature is normal and feels normal but could have an occult fever that were not picking up I do not think he would tolerate a rectal temperature with his current work of breathing so we will treat Tylenol  presumptively to see if that helps.  If not I had discussed with pharmacy about adding on amiodarone  to try to get better rate control. -With the multiple vital sign abnormalities going on I did discuss with his daughter on the phone, [name], she states that he has had DNR paperwork filled out in the past and has verbally expressed to her that he would not want to be on a ventilator.  He also would want life-saving measures if possible but he does not want to have any decrease in his quality of life.  So if it was thought that he would not make it back to his previous level he would not want intervention to be done.  She states that he would be okay with BiPAP or other masks for breathing but once again would not want to be intubated or ventilated.      Cardiac Monitoring:  The patient was  maintained on a cardiac monitor.  I  personally viewed and interpreted the cardiac monitored which showed an underlying rhythm of: Atrial fibrillation with a rapid ventricular response  CRITICAL INTERVENTIONS:  Antibiotics Diltiazem  Fluids Admission End of life discussions with wife and daughter  Reevaluation:  After the interventions noted above, I reevaluated the patient and found that they have :stayed the same  FINAL IMPRESSION AND PLAN Final diagnoses:  COPD exacerbation (HCC)  Pneumonia of both lungs due to infectious organism, unspecified part of lung  Atrial fibrillation with rapid ventricular response [facility])     Medical screening exam was performed and I feel the patient has had appropriate emergency department evaluation and work-up for their chief complaint and is stable for ADMISSION to the hospitalist time.  I discussed with Dr.Shahmedi With the hospitalist service and discussed labs, imaging and other work-up in the emergency room.  They agree to admission for further management and work-up of said condition.  Will add on COVID/flu/RSV ____________________________________________   NEW OUTPATIENT MEDICATIONS STARTED DURING THIS VISIT:  New Prescriptions   No medications on file    Note:  This note was prepared with assistance of Dragon voice recognition software. Occasional wrong-word or sound-a-like substitutions may have occurred due to the inherent limitations of voice recognition software.    Daniya Aramburo, Selinda, MD 12/29/23 [phone]

## 2023-12-29 NOTE — Assessment & Plan Note (Signed)
 With behavior disturbances currently not on any chronic medication with exception of sertraline Will utilize as needed Haldol versus Ativan

## 2023-12-29 NOTE — Assessment & Plan Note (Addendum)
 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

## 2023-12-29 NOTE — ED Notes (Signed)
 Patient transported to CT

## 2023-12-29 NOTE — Assessment & Plan Note (Signed)
 As needed Haldol/Ativan

## 2023-12-30 DIAGNOSIS — J9621 Acute and chronic respiratory failure with hypoxia: Secondary | ICD-10-CM | POA: Diagnosis not present

## 2023-12-30 DIAGNOSIS — A419 Sepsis, unspecified organism: Secondary | ICD-10-CM | POA: Diagnosis not present

## 2023-12-30 LAB — CBC
HCT: 35.6 % — ABNORMAL LOW (ref 39.0–52.0)
Hemoglobin: 11 g/dL — ABNORMAL LOW (ref 13.0–17.0)
MCH: 30.6 pg (ref 26.0–34.0)
MCHC: 30.9 g/dL (ref 30.0–36.0)
MCV: 98.9 fL (ref 80.0–100.0)
Platelets: 258 10*3/uL (ref 150–400)
RBC: 3.6 MIL/uL — ABNORMAL LOW (ref 4.22–5.81)
RDW: 12.9 % (ref 11.5–15.5)
WBC: 8 10*3/uL (ref 4.0–10.5)
nRBC: 0 % (ref 0.0–0.2)

## 2023-12-30 LAB — COMPREHENSIVE METABOLIC PANEL
ALT: 21 U/L (ref 0–44)
AST: 31 U/L (ref 15–41)
Albumin: 2.7 g/dL — ABNORMAL LOW (ref 3.5–5.0)
Alkaline Phosphatase: 60 U/L (ref 38–126)
Anion gap: 9 (ref 5–15)
BUN: 31 mg/dL — ABNORMAL HIGH (ref 8–23)
CO2: 27 mmol/L (ref 22–32)
Calcium: 8.8 mg/dL — ABNORMAL LOW (ref 8.9–10.3)
Chloride: 104 mmol/L (ref 98–111)
Creatinine, Ser: 1.32 mg/dL — ABNORMAL HIGH (ref 0.61–1.24)
GFR, Estimated: 54 mL/min — ABNORMAL LOW (ref >60)
Glucose, Bld: 174 mg/dL — ABNORMAL HIGH (ref 70–99)
Potassium: 4.1 mmol/L (ref 3.5–5.1)
Sodium: 140 mmol/L (ref 135–145)
Total Bilirubin: 0.8 mg/dL (ref 0.0–1.2)
Total Protein: 6.1 g/dL — ABNORMAL LOW (ref 6.5–8.1)

## 2023-12-30 LAB — GLUCOSE, CAPILLARY
Glucose-Capillary: 154 mg/dL — ABNORMAL HIGH (ref 70–99)
Glucose-Capillary: 151 mg/dL — ABNORMAL HIGH (ref 70–99)

## 2023-12-30 MED ORDER — MELATONIN 3 MG PO TABS: 6.0000 mg | ORAL_TABLET | Freq: Every day | ORAL | Status: DC

## 2023-12-30 MED ORDER — DIGOXIN 125 MCG PO TABS
0.0625 mg | ORAL_TABLET | Freq: Every day | ORAL | Status: AC
  Administered 2023-12-30: 0.0625 mg via ORAL
  Filled 2023-12-30: qty 1

## 2023-12-30 MED ORDER — MELATONIN 3 MG PO TABS
6.0000 mg | ORAL_TABLET | Freq: Every day | ORAL | Status: DC
  Administered 2023-12-30 – 2024-01-06 (×9): 6 mg via ORAL
  Filled 2023-12-30 (×9): qty 2

## 2023-12-30 NOTE — Plan of Care (Signed)
  Problem: Fluid Volume: Goal: Hemodynamic stability will improve Outcome: Progressing   Problem: Clinical Measurements: Goal: Diagnostic test results will improve Outcome: Progressing Goal: Signs and symptoms of infection will decrease Outcome: Progressing   Problem: Respiratory: Goal: Ability to maintain adequate ventilation will improve Outcome: Progressing   Problem: Education: Goal: Knowledge of General Education information will improve Description: Including pain rating scale, medication(s)/side effects and non-pharmacologic comfort measures Outcome: Progressing   Problem: Health Behavior/Discharge Planning: Goal: Ability to manage health-related needs will improve Outcome: Progressing   Problem: Clinical Measurements: Goal: Ability to maintain clinical measurements within normal limits will improve Outcome: Progressing Goal: Will remain free from infection Outcome: Progressing Goal: Diagnostic test results will improve Outcome: Progressing Goal: Respiratory complications will improve Outcome: Progressing Goal: Cardiovascular complication will be avoided Outcome: Progressing   Problem: Activity: Goal: Risk for activity intolerance will decrease Outcome: Progressing   Problem: Nutrition: Goal: Adequate nutrition will be maintained Outcome: Progressing   Problem: Coping: Goal: Level of anxiety will decrease Outcome: Progressing   Problem: Elimination: Goal: Will not experience complications related to bowel motility Outcome: Progressing Goal: Will not experience complications related to urinary retention Outcome: Progressing   Problem: Pain Management: Goal: General experience of comfort will improve Outcome: Progressing   Problem: Safety: Goal: Ability to remain free from injury will improve Outcome: Progressing   Problem: Skin Integrity: Goal: Risk for impaired skin integrity will decrease Outcome: Progressing   Problem: Safety: Goal:  Non-violent Restraint(s) Outcome: Progressing

## 2023-12-30 NOTE — TOC Initial Note (Signed)
 Transition of Care [facility]) - Initial/Assessment Note    Patient Details  Name: Kyle Owen MRN: [id number] Date of Birth: 1941-03-21  [facility]) CM/SW Contact:    Rollo Petri, LCSW Phone Number: 12/30/2023, 12:31 PM  Clinical Narrative:                  Pt admitted from home. He has a high readmission risk score. Met with pt and his wife at bedside to assess. Pt with dementia and not fully oriented at this time.  Pt's wife reports that she does assist with pt's care at home when needed. Pt has a cane and a walker though has refused to use the walker. He also has O2 through [employer]. Pt does not drive. His wife states that they are able to get to appointments and obtain medications as needed. They each have children out of the area and have some support from their church family locally.  Pt has had [id number] in the past though is not currently active with HH. Pt's wife is anticipating she will take pt home at DC.  TOC will follow and continue to assess and assist with dc planning.  Expected Discharge Plan: Home w Home Health Services Barriers to Discharge: Continued Medical Work up   Patient Goals and CMS Choice Patient states their goals for this hospitalization and ongoing recovery are:: return home          Expected Discharge Plan and Services In-house Referral: Clinical Social Work     Living arrangements for the past 2 months: Single Family Home                                      Prior Living Arrangements/Services Living arrangements for the past 2 months: Single Family Home Lives with:: Spouse Patient language and need for interpreter reviewed:: Yes Do you feel safe going back to the place where you live?: Yes      Need for Family Participation in Patient Care: Yes (Comment) Care giver support system in place?: Yes (comment) Current home services: DME Criminal Activity/Legal Involvement Pertinent to Current Situation/Hospitalization: No  - Comment as needed  Activities of Daily Living   ADL Screening (condition at time of admission) Independently performs ADLs?: No Does the patient have a NEW difficulty with bathing/dressing/toileting/self-feeding that is expected to last >3 days?: No Does the patient have a NEW difficulty with getting in/out of bed, walking, or climbing stairs that is expected to last >3 days?: No Does the patient have a NEW difficulty with communication that is expected to last >3 days?: No Is the patient deaf or have difficulty hearing?: Yes Does the patient have difficulty seeing, even when wearing glasses/contacts?: Yes Does the patient have difficulty concentrating, remembering, or making decisions?: No  Permission Sought/Granted                  Emotional Assessment Appearance:: Appears stated age     Orientation: : Oriented to Self Alcohol / Substance Use: Not Applicable Psych Involvement: No (comment)  Admission diagnosis:  COPD exacerbation (HCC) [J44.1] Atrial fibrillation with rapid ventricular response (HCC) [I48.91] Sepsis (HCC) [A41.9] Pneumonia of both lungs due to infectious organism, unspecified part of lung [J18.9] Patient Active Problem List   Diagnosis Date Noted   Sepsis (HCC) 12/29/2023   Advanced dementia (HCC) 12/29/2023   Hyperlipidemia 12/29/2023   Acute on chronic respiratory  failure with hypoxia (HCC) 12/29/2023   Pulmonary nodule, left 11/26/2023   COPD GOLD 2 AB 10/31/2023   Chronic kidney disease, stage 3b (HCC) 10/20/2023   COPD exacerbation (HCC) 10/19/2023   Chronic respiratory failure with hypoxia (HCC) 12/05/2022   Aortic disease (HCC) 05/15/2022   Chronic anticoagulation 10/06/2021   History of stroke 10/06/2021   Bladder cancer (HCC) 10/06/2021   Morning headache 04/04/2021   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   COPD with chronic bronchitis and emphysema (HCC) 09/22/2020    Penile pain 04/07/2020   Balanitis 04/07/2020   Pronation of right foot 04/04/2018   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   Facet arthropathy, lumbar 08/31/2017   Lumbar spondylosis 08/31/2017   Cervical myelopathy (HCC) 04/03/2017   Thoracic ascending aortic aneurysm (HCC) 05/09/2016   Paroxysmal atrial fibrillation (HCC) 12/17/2015   S/P partial thyroidectomy 01/20/2015   Anxiety 08/02/2012   Arthritis, degenerative 08/02/2012   Benign fibroma of prostate 08/02/2012   Clinical depression 08/02/2012   Colon polyp 08/02/2012   Coxitis 08/02/2012   Current smoker 08/02/2012   Disorder involving thrombocytopenia (HCC) 08/02/2012   Ocular migraine 08/02/2012   PCP:  Sheryle Carwin, MD Pharmacy:   [facility] - [place], [place] - [address] [place] [address] Phone: [phone] Fax: [phone]     Social Drivers of Health (SDOH) Social History: SDOH Screenings   Food Insecurity: No Food Insecurity (12/29/2023)  Housing: Unknown (12/29/2023)  Transportation Needs: No Transportation Needs (12/29/2023)  Utilities: Not At Risk (12/29/2023)  Social Connections: Socially Integrated (12/29/2023)  Tobacco Use: Medium Risk (12/29/2023)   SDOH Interventions:     Readmission Risk Interventions    12/30/2023   12:30 PM  Readmission Risk Prevention Plan  Transportation Screening Complete  Medication Review Ocean[employer]) Complete  HRI or Home Care Consult Complete  SW Recovery Care/Counseling Consult Complete  Palliative Care Screening Not Applicable  Skilled Nursing Facility Not Applicable

## 2023-12-30 NOTE — Progress Notes (Signed)
 PROGRESS NOTE    Patient: Kyle Owen                            PCP: Sheryle Carwin, MD                    DOB: 02/06/1941            DOA: 12/29/2023 FMW:[id number]             DOS: 12/30/2023, 1:47 PM   LOS: 1 day   Date of Service: The patient was seen and examined on 12/30/2023  Subjective:   The patient was seen and examined this morning. Mild tachypnea, tachycardic... Significant agitation, confusion overnight Soft restraints was placed, patient was started on as needed Haldol   Brief Narrative:   Kyle Owen is a-year-old male with extensive history of advanced dementia, chronic A-fib on Eliquis , BPH, prostate cancer, COPD to dependent 4 L, depression, TIAs, HLD, HTN, OA, DJD, old left pulmonary nodule, chronic thoracic aneurysm.... Presenting today with progressive shortness of breath, hypoxia on 2-4 L satting 88%... Improved to 90% on 6 L, also complaining of palpitation and dizziness.   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    Assessment & Plan:   Principal Problem:   Sepsis (HCC) Active Problems:   Paroxysmal atrial fibrillation (HCC)   Acute on chronic respiratory failure with hypoxia (HCC)   Thoracic ascending aortic aneurysm (HCC)   Advanced dementia (HCC)   Chronic kidney disease, stage 3b (HCC)   COPD with chronic bronchitis and emphysema (HCC)   Chronic  respiratory failure with hypoxia (HCC)   Anxiety   Hyperlipidemia     Assessment and Plan: * Sepsis (HCC)-due to multifocal pneumonia Sepsis was ruled with tachycardia, tachypnea, with acute on chronic respiratory failure requiring up to 6 L of oxygen  Vitals this a.m.: Temp 97.7, P110, RR 22, BP 103/61, satting 93% on 5 L of oxygen  Creatinine 1.32, lactic acid 1.1, Influenza A/B, SARS-CoV-2 negative  -Source of infection multifocal pneumonia  -2 sets of blood cultures -Will try to obtain sputum cultures  CT angiogram multiple artifact negative for any obvious pulmonary embolism, multilobar pneumonia , 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 Chest x-ray: Diffuse superficial prominence in the lower lobe could reflect edema or infection  Patient was transferred to ICU, on close elevation -Continue broad-spectrum antibiotics of Rocephin  azithromycin , IV fluids Will follow-up with cultures     Acute on chronic respiratory failure with hypoxia (HCC) -O2 dependent 5 to 6 L currently on 5 L satting 93% Due to multifocal pneumonia and COPD exacerbation -Continue with current antibiotics -Bronchodilators as needed  -IV steroids  Paroxysmal atrial fibrillation (HCC) Currently in A-fib with RVR Now persistent atrial fibrillation, chronically anticoagulated with Eliquis  Home rate controlled medication verapamil  -Currently started on Cardizem  drip, will taper off  Advanced dementia (  HCC) With behavior disturbances currently not on any chronic medication with exception of sertraline  Will utilize as needed Haldol  versus Ativan  Thoracic ascending aortic aneurysm (HCC) -Chronic history of thoracic ascending aortic aneurysm -Based on today's CTA 4.7 cm no changes -Follow-up as an outpatient and monitor closely  Chronic kidney disease, stage 3b (HCC) Monitoring BUN/creatinine closely currently at baseline Waiting nephrotoxins-will adjust medication renally  Hyperlipidemia Will  continue statins  Anxiety As needed Haldol /Ativan  Chronic respiratory failure with hypoxia (HCC)/COPD History of chronic respiratory failure with COPD, O2 dependent 4 L,   Management as above     ----------------------------------------------------------------------------------------------------------------------------------------------- Nutritional status:  The patient's BMI is: Body mass index is 25.83 kg/m. I agree with the assessment and plan as outlined   --------------------------------------------------------------------------------------------------------------------------------------------- Cultures; Blood Cultures x 2 >>     ------------------------------------------------------------------------------------------------------------------------------------------------  DVT prophylaxis:  SCDs Start: 12/29/23 0750 apixaban  (ELIQUIS ) tablet 5 mg   Code Status:   Code Status: Limited: Do not attempt resuscitation (DNR) -DNR-LIMITED -Do Not Intubate/DNI   Family Communication: Wife present at bedside-updated -Advance care planning has been discussed.   Admission status:   Status is: Inpatient Remains inpatient appropriate because: Needing IV antibiotics   Disposition: From  - home             Planning for discharge in 2 days: to home with home health  Procedures:   No admission procedures for hospital encounter.   Antimicrobials:  Anti-infectives (From admission, onward)    Start     Dose/Rate Route Frequency Ordered Stop   12/30/23 0300  cefTRIAXone  (ROCEPHIN ) 2 g in sodium chloride  0.9 % 100 mL IVPB        2 g 200 mL/hr over 30 Minutes Intravenous Every 24 hours 12/29/23 0755 01/03/24 0259   12/29/23 0900  azithromycin  (ZITHROMAX ) 500 mg in sodium chloride  0.9 % 250 mL IVPB        500 mg 250 mL/hr over 60 Minutes Intravenous Every 24 hours 12/29/23 0755 01/03/24 0859   12/29/23 0330  cefTRIAXone  (ROCEPHIN ) 2 g in sodium chloride  0.9 % 100 mL IVPB         2 g 200 mL/hr over 30 Minutes Intravenous  Once 12/29/23 0322 12/29/23 0410   12/29/23 0330  azithromycin  (ZITHROMAX ) 500 mg in sodium chloride  0.9 % 250 mL IVPB  Status:  Discontinued        500 mg 250 mL/hr over 60 Minutes Intravenous Every 24 hours 12/29/23 0322 12/29/23 0755        Medication:   apixaban   5 mg Oral BID   atorvastatin   20 mg Oral Daily   Chlorhexidine  Gluconate Cloth  6 each Topical Daily   feeding supplement  237 mL Oral BID BM   finasteride   5 mg Oral q AM   fluticasone  furoate-vilanterol  1 puff Inhalation Daily   melatonin  6 mg Oral QHS   methylPREDNISolone  (SOLU-MEDROL ) injection  40 mg Intravenous Q12H   sertraline   50 mg Oral q morning   sodium chloride  flush  3 mL Intravenous Q12H   sodium chloride  flush  3 mL Intravenous Q12H   traZODone   50 mg Oral QHS   verapamil   240 mg Oral q AM    acetaminophen  **OR** acetaminophen , bisacodyl , haloperidol  lactate, hydrALAZINE , HYDROmorphone  (DILAUDID ) injection, ipratropium, levalbuterol , ondansetron  **OR** ondansetron  (ZOFRAN ) IV, oxyCODONE , senna-docusate, sodium phosphate    Objective:   Vitals:   12/30/23 1100 12/30/23 1130 12/30/23 1137 12/30/23 1200  BP: 108/72 (!) 109/53  103/61  Pulse: ROLLEN)  123   (!) 110  Resp: (!) 23   (!) 22  Temp:   97.7 F (36.5 C)   TempSrc:   Oral   SpO2: 99%   93%  Weight:      Height:        Intake/Output Summary (Last 24 hours) at 12/30/2023 1347 Last data filed at 12/30/2023 9056 Gross per 24 hour  Intake 1345.06 ml  Output 800 ml  Net 545.06 ml   Filed Weights   12/29/23 0213 12/29/23 0633 12/30/23 0500  Weight: 88 kg 87.5 kg 86.4 kg     Physical examination:   Constitution: Altered, pleasantly confused,  Psychiatric:   Normal and stable mood and affect, cognition intact,   HEENT:        Normocephalic, PERRL, otherwise with in Normal limits  Chest:         Chest symmetric Cardio vascular:  S1/S2, RRR, No murmure, No Rubs or Gallops  pulmonary: Clear to  auscultation bilaterally, respirations unlabored, negative wheezes / crackles Abdomen: Soft, non-tender, non-distended, bowel sounds,no masses, no organomegaly Muscular skeletal: Limited exam - in bed, able to move all 4 extremities,   Neuro: CNII-XII intact. , normal motor and sensation, reflexes intact  Extremities: No pitting edema lower extremities, +2 pulses  Skin: Dry, warm to touch, negative for any Rashes, No open wounds Wounds: per nursing documentation   ------------------------------------------------------------------------------------------------------------------------------------------    LABs:     Latest Ref Rng & Units 12/30/2023    5:54 AM 12/29/2023    2:15 AM 10/20/2023    5:00 AM  CBC  WBC 4.0 - 10.5 K/uL 8.0  10.2  6.8   Hemoglobin 13.0 - 17.0 g/dL 88.9  87.3  88.5   Hematocrit 39.0 - 52.0 % 35.6  40.1  36.4   Platelets 150 - 400 K/uL 258  267  133       Latest Ref Rng & Units 12/30/2023    5:54 AM 12/29/2023    2:15 AM 10/20/2023    5:00 AM  CMP  Glucose 70 - 99 mg/dL 825  843  752   BUN 8 - 23 mg/dL 31  32  40   Creatinine 0.61 - 1.24 mg/dL 8.67  8.33  8.33   Sodium 135 - 145 mmol/L 140  137  137   Potassium 3.5 - 5.1 mmol/L 4.1  3.7  3.9   Chloride 98 - 111 mmol/L 104  102  104   CO2 22 - 32 mmol/L 27  26  25    Calcium  8.9 - 10.3 mg/dL 8.8  8.9  8.7   Total Protein 6.5 - 8.1 g/dL 6.1  6.8    Total Bilirubin 0.0 - 1.2 mg/dL 0.8  1.2    Alkaline Phos 38 - 126 U/L 60  68    AST 15 - 41 U/L 31  31    ALT 0 - 44 U/L 21  20         Micro Results Recent Results (from the past 240 hours)  Culture, blood (single)     Status: None (Preliminary result)   Collection Time: 12/29/23  7:48 AM   Specimen: Left Antecubital; Blood  Result Value Ref Range Status   Specimen Description   Final    LEFT ANTECUBITAL BOTTLES DRAWN AEROBIC AND ANAEROBIC   Special Requests   Final    Blood Culture results may not be optimal due to an inadequate volume of blood received  in culture bottles   Culture  Final    NO GROWTH < 24 HOURS Performed at [facility], [address] [place], [place] [address]    Report Status PENDING  Incomplete  Culture, blood (x 2)     Status: None (Preliminary result)   Collection Time: 12/29/23  8:10 AM   Specimen: BLOOD LEFT HAND  Result Value Ref Range Status   Specimen Description   Final    BLOOD LEFT HAND BOTTLES DRAWN AEROBIC AND ANAEROBIC   Special Requests Blood Culture adequate volume  Final   Culture   Final    NO GROWTH < 24 HOURS Performed at [facility], [address], [place], [place] [address]    Report Status PENDING  Incomplete  Resp panel by RT-PCR (RSV, Flu A&B, Covid) Anterior Nasal Swab     Status: None   Collection Time: 12/29/23  8:22 AM   Specimen: Anterior Nasal Swab  Result Value Ref Range Status   SARS Coronavirus 2 by RT PCR NEGATIVE NEGATIVE Final    Comment: (NOTE) SARS-CoV-2 target nucleic acids are NOT DETECTED.  The SARS-CoV-2 RNA is generally detectable in upper respiratory specimens during the acute phase of infection. The lowest concentration of SARS-CoV-2 viral copies this assay can detect is 138 copies/mL. A negative result does not preclude SARS-Cov-2 infection and should not be used as the sole basis for treatment or other patient management decisions. A negative result may occur with  improper specimen collection/handling, submission of specimen other than nasopharyngeal swab, presence of viral mutation(s) within the areas targeted by this assay, and inadequate number of viral copies(<138 copies/mL). A negative result must be combined with clinical observations, patient history, and epidemiological information. The expected result is Negative.  Fact Sheet for Patients:  bloggercourse.com  Fact Sheet for Healthcare Providers:  seriousbroker.it  This test is no t yet approved or cleared by the [place]  FDA and  has  been authorized for detection and/or diagnosis of SARS-CoV-2 by FDA under an Emergency Use Authorization (EUA). This EUA will remain  in effect (meaning this test can be used) for the duration of the COVID-19 declaration under Section 564(b)(1) of the Act, 21 U.S.C.section 360bbb-3(b)(1), unless the authorization is terminated  or revoked sooner.       Influenza A by PCR NEGATIVE NEGATIVE Final   Influenza B by PCR NEGATIVE NEGATIVE Final    Comment: (NOTE) The Xpert Xpress SARS-CoV-2/FLU/RSV plus assay is intended as an aid in the diagnosis of influenza from Nasopharyngeal swab specimens and should not be used as a sole basis for treatment. Nasal washings and aspirates are unacceptable for Xpert Xpress SARS-CoV-2/FLU/RSV testing.  Fact Sheet for Patients: bloggercourse.com  Fact Sheet for Healthcare Providers: seriousbroker.it  This test is not yet approved or cleared by the [place]  FDA and has been authorized for detection and/or diagnosis of SARS-CoV-2 by FDA under an Emergency Use Authorization (EUA). This EUA will remain in effect (meaning this test can be used) for the duration of the COVID-19 declaration under Section 564(b)(1) of the Act, 21 U.S.C. section 360bbb-3(b)(1), unless the authorization is terminated or revoked.     Resp Syncytial Virus by PCR NEGATIVE NEGATIVE Final    Comment: (NOTE) Fact Sheet for Patients: bloggercourse.com  Fact Sheet for Healthcare Providers: seriousbroker.it  This test is not yet approved or cleared by the [place]  FDA and has been authorized for detection and/or diagnosis of SARS-CoV-2 by FDA under an Emergency Use Authorization (EUA). This EUA will remain in effect (meaning this test  can be used) for the duration of the COVID-19 declaration under Section 564(b)(1) of the Act, 21 U.S.C. section 360bbb-3(b)(1), unless the  authorization is terminated or revoked.  Performed at [facility], [address], [place], [place] [address]   MRSA Next Gen by PCR, Nasal     Status: None   Collection Time: 12/29/23  1:13 PM   Specimen: Nasal Mucosa; Nasal Swab  Result Value Ref Range Status   MRSA by PCR Next Gen NOT DETECTED NOT DETECTED Final    Comment: (NOTE) The GeneXpert MRSA Assay (FDA approved for NASAL specimens only), is one component of a comprehensive MRSA colonization surveillance program. It is not intended to diagnose MRSA infection nor to guide or monitor treatment for MRSA infections. Test performance is not FDA approved in patients less than 55 years old. Performed at [facility], [address], [place], [place] [address]     Radiology Reports No results found.  SIGNED: Adriana DELENA Grams, MD, FHM. FAAFP. Jolynn Pack - [facility] Critical care time spent - 55 min.  In seeing, evaluating and examining the patient. Reviewing medical records, labs, drawn plan of care. [facility],  Pager (please use [email or web address] to page/ text) Please use Epic Secure Chat for non-urgent communication (7AM-7PM)  If 7PM-7AM, please contact night-coverage [email or web address], 12/30/2023, 1:47 PM

## 2023-12-31 DIAGNOSIS — H9190 Unspecified hearing loss, unspecified ear: Secondary | ICD-10-CM

## 2023-12-31 DIAGNOSIS — Z7189 Other specified counseling: Secondary | ICD-10-CM

## 2023-12-31 DIAGNOSIS — Z66 Do not resuscitate: Secondary | ICD-10-CM

## 2023-12-31 DIAGNOSIS — J441 Chronic obstructive pulmonary disease with (acute) exacerbation: Secondary | ICD-10-CM | POA: Diagnosis not present

## 2023-12-31 DIAGNOSIS — F02C11 Dementia in other diseases classified elsewhere, severe, with agitation: Secondary | ICD-10-CM

## 2023-12-31 DIAGNOSIS — Z515 Encounter for palliative care: Secondary | ICD-10-CM

## 2023-12-31 DIAGNOSIS — G309 Alzheimer's disease, unspecified: Secondary | ICD-10-CM

## 2023-12-31 DIAGNOSIS — A419 Sepsis, unspecified organism: Secondary | ICD-10-CM | POA: Diagnosis not present

## 2023-12-31 DIAGNOSIS — I4891 Unspecified atrial fibrillation: Secondary | ICD-10-CM | POA: Diagnosis not present

## 2023-12-31 DIAGNOSIS — I7121 Aneurysm of the ascending aorta, without rupture: Secondary | ICD-10-CM

## 2023-12-31 DIAGNOSIS — J189 Pneumonia, unspecified organism: Secondary | ICD-10-CM

## 2023-12-31 LAB — CBC
HCT: 39 % (ref 39.0–52.0)
Hemoglobin: 12.2 g/dL — ABNORMAL LOW (ref 13.0–17.0)
MCH: 30.8 pg (ref 26.0–34.0)
MCHC: 31.3 g/dL (ref 30.0–36.0)
MCV: 98.5 fL (ref 80.0–100.0)
Platelets: 334 10*3/uL (ref 150–400)
RBC: 3.96 MIL/uL — ABNORMAL LOW (ref 4.22–5.81)
RDW: 12.9 % (ref 11.5–15.5)
WBC: 13.8 10*3/uL — ABNORMAL HIGH (ref 4.0–10.5)
nRBC: 0 % (ref 0.0–0.2)

## 2023-12-31 LAB — COMPREHENSIVE METABOLIC PANEL
ALT: 26 U/L (ref 0–44)
AST: 39 U/L (ref 15–41)
Albumin: 3 g/dL — ABNORMAL LOW (ref 3.5–5.0)
Alkaline Phosphatase: 70 U/L (ref 38–126)
Anion gap: 13 (ref 5–15)
BUN: 40 mg/dL — ABNORMAL HIGH (ref 8–23)
CO2: 24 mmol/L (ref 22–32)
Calcium: 9.1 mg/dL (ref 8.9–10.3)
Chloride: 104 mmol/L (ref 98–111)
Creatinine, Ser: 1.5 mg/dL — ABNORMAL HIGH (ref 0.61–1.24)
GFR, Estimated: 46 mL/min — ABNORMAL LOW (ref >60)
Glucose, Bld: 182 mg/dL — ABNORMAL HIGH (ref 70–99)
Potassium: 4.1 mmol/L (ref 3.5–5.1)
Sodium: 141 mmol/L (ref 135–145)
Total Bilirubin: 0.5 mg/dL (ref 0.0–1.2)
Total Protein: 6.7 g/dL (ref 6.5–8.1)

## 2023-12-31 LAB — GLUCOSE, CAPILLARY: Glucose-Capillary: 179 mg/dL — ABNORMAL HIGH (ref 70–99)

## 2023-12-31 MED ORDER — AMIODARONE LOAD VIA INFUSION
150.0000 mg | Freq: Once | INTRAVENOUS | Status: AC
  Administered 2023-12-31: 150 mg via INTRAVENOUS
  Filled 2023-12-31: qty 83.34

## 2023-12-31 MED ORDER — ORAL CARE MOUTH RINSE
15.0000 mL | OROMUCOSAL | Status: AC | PRN
Start: 2023-12-31 — End: ?

## 2023-12-31 MED ORDER — METHYLPREDNISOLONE SODIUM SUCC 40 MG IJ SOLR
40.0000 mg | INTRAMUSCULAR | Status: DC
  Administered 2023-12-31: 40 mg via INTRAVENOUS
  Filled 2023-12-31: qty 1

## 2023-12-31 MED ORDER — DOXYCYCLINE HYCLATE 100 MG IV SOLR
100.0000 mg | Freq: Two times a day (BID) | INTRAVENOUS | Status: DC
  Administered 2024-01-01: 100 mg via INTRAVENOUS
  Filled 2023-12-31 (×3): qty 100

## 2023-12-31 MED ORDER — AMIODARONE HCL IN DEXTROSE 360-4.14 MG/200ML-% IV SOLN
60.0000 mg/h | INTRAVENOUS | Status: DC
  Administered 2023-12-31 (×2): 60 mg/h via INTRAVENOUS
  Filled 2023-12-31: qty 200

## 2023-12-31 MED ORDER — AMIODARONE HCL IN DEXTROSE 360-4.14 MG/200ML-% IV SOLN
30.0000 mg/h | INTRAVENOUS | Status: DC
  Administered 2023-12-31 – 2024-01-03 (×6): 30 mg/h via INTRAVENOUS
  Filled 2023-12-31 (×8): qty 200

## 2023-12-31 NOTE — Consult Note (Signed)
 Cardiology Consultation   Patient ID: Kyle Owen MRN: [id number]; DOB: [birth year]  Admit date: 12/29/2023 Date of Consult: 12/31/2023  PCP:  Sheryle Carwin, MD   [facility] Providers Cardiologist:  Oneil Parchment, MD   {    Patient Profile:   Kyle Owen is a 83 y.o. male with a hx of history of PAF, ascending aortic aneurysm, HLD, bladder CA, COPD on 4L [place] at home, prior CVA, dementia who is being seen 12/31/2023 for the evaluation of tachycardia at the request of Dr Willette.  History of Present Illness:   Kyle Owen 83 yo male history of PAF, ascending aortic aneurysm, HLD, bladder CA, COPD on 4L  at home, prior CVA, dementia,  presents with shortness of breath. Admitted with multifocal pneumonia/sepsis and COPD exacerbation by primary team, in this setting issues with afib with RVR. Cardiology consulted to help manage afib.    WBC 10.2 Hgb 12.6 Plt 267 K 3.7 Cr 1.66 BUN 32 GFR 41 BNP 585 Mg 2.3 TSH 0.835 Procal 0.45 Lactic acid 1.3 EKG CXR edema vs infection CT PE: limited exam, no PE, severe bilaterla bronchopneumonia. Aneurysm 4.7cm ascending aorta  Past Medical History:  Diagnosis Date   A-fib (HCC)    Anticoagulant long-term use    eliquis --- managed by cardiology   Benign prostatic hypertrophy with urinary frequency    Bladder cancer (HCC)    Chronic respiratory failure with hypoxia (HCC)    Complication of anesthesia    gets combative in recovery   COPD with chronic bronchitis and emphysema (HCC)    pulmonologist--- dr jude;  nocturnal oxygen,  no daytime oxygen use   Dementia (HCC)    Dependence on nocturnal oxygen therapy    Depression    Dyspnea    when walking   History of colon polyps    History of kidney stones    History of loop recorder    loop recorder in place battery is dead has not had changed due to covid   History of TIA (transient ischemic attack)    (per pt no residual ) per neurologist note (dr margaret 11-08-2015) dx  cryptogenic TIA versus arrhythia versus dysautonomia (right ophthalmic artery TIA and x3 posterior circulation TIAs   Hyperlipidemia    Hypertension    Pt denies   Malignant neoplasm of overlapping sites of bladder [facility])    urologist--- dr matilda   Multinodular thyroid     per pathology report 11-12-2014 bilateral thyroid   benign multinoduler follicular adenoma and hyperplastic    Nephrolithiasis    OA (osteoarthritis)    DJD right knee   Ocular migraine    controlled w/ verapamil    On home oxygen therapy    PAF (paroxysmal atrial fibrillation) (HCC)    followed by AFIB clinic-- cardiologist-- dr parchment   Pneumonia    2023   Pulmonary nodule, left    left lower lobe x2 per CT 01-20-2016   Renal cyst, left    Stroke [facility])    Thoracic aortic aneurysm without rupture [facility])    followed by dr fleeta tright ;   ascending -- ct chest 01-24-2023   4.8cm   Wears hearing aid in both ears    wear at times    Past Surgical History:  Procedure Laterality Date   ANTERIOR CERVICAL DECOMP/DISCECTOMY FUSION N/A 04/03/2017   Procedure: Cervical three-four, Cervical four-five Anterior cervical decompression/discectomy/fusion;  Surgeon: Fairy Levels, MD;  Location: [facility] OR;  Service: Neurosurgery;  Laterality: N/A;  CATARACT EXTRACTION W/ INTRAOCULAR LENS IMPLANT Right 2016   CATARACT EXTRACTION W/PHACO  12/16/2012   Procedure: CATARACT EXTRACTION PHACO AND INTRAOCULAR LENS PLACEMENT (IOC);  Surgeon: Cherene Mania, MD;  Location: [facility] ORS;  Service: Ophthalmology;  Laterality: Left;  CDE:17.31   COLONOSCOPY  10/03/2011   Procedure: COLONOSCOPY;  Surgeon: Oneil DELENA Budge;  Location: [facility] ENDO SUITE;  Service: Gastroenterology;  Laterality: N/A;   CYSTOSCOPY W/ RETROGRADES Bilateral 04/12/2023   Procedure: CYSTOSCOPY WITH RETROGRADE PYELOGRAM;  Surgeon: Matilda Senior, MD;  Location: [facility] ORS;  Service: Urology;  Laterality: Bilateral;   CYSTOSCOPY W/ RETROGRADES Bilateral 11/12/2023   Procedure: CYSTOSCOPY  WITH RETROGRADE PYELOGRAM;  Surgeon: Matilda Senior, MD;  Location: [facility] ORS;  Service: Urology;  Laterality: Bilateral;   CYSTOSCOPY WITH BIOPSY N/A 04/12/2023   Procedure: CYSTOSCOPY WITH BLADDER BIOPSY;  Surgeon: Matilda Senior, MD;  Location: [facility] ORS;  Service: Urology;  Laterality: N/A;  45 MINS   CYSTOSCOPY WITH BIOPSY N/A 11/12/2023   Procedure: CYSTOSCOPY WITH BLADDER  BIOPSY AND PROSTATIC URETHRA;  Surgeon: Matilda Senior, MD;  Location: [facility] ORS;  Service: Urology;  Laterality: N/A;   CYSTOSCOPY/RETROGRADE/URETEROSCOPY/STONE EXTRACTION WITH BASKET Right 03/29/2020   Procedure: CYSTOSCOPY/RETROGRADE/URETEROSCOPY/STONE EXTRACTION WITH BASKET;  Surgeon: Matilda Senior, MD;  Location: [facility]S;  Service: Urology;  Laterality: Right;  1 HR   DECOMPRESSIOIN ULNAR NERVE AND CUBITAL TUNNEL RELEASE Left 07/14/2009   elbow   EP IMPLANTABLE DEVICE N/A 08/10/2015   MDT ILR implanted by Dr Kelsie for cryptogenic stroke   HOLMIUM LASER APPLICATION Right 03/29/2020   Procedure: HOLMIUM LASER APPLICATION;  Surgeon: Matilda Senior, MD;  Location: [facility];  Service: Urology;  Laterality: Right;   INGUINAL HERNIA REPAIR Right 1990's   KNEE ARTHROSCOPY Right 2015   POSTERIOR LUMBAR FUSION     2014   L3--4;    12/ 2018   L4--5   SHOULDER ARTHROSCOPY Left 1990's   TEE WITHOUT CARDIOVERSION N/A 07/27/2015   Procedure: TRANSESOPHAGEAL ECHOCARDIOGRAM (TEE);  Surgeon: Redell GORMAN Shallow, MD;  Location: [facility] ENDOSCOPY;  Service: Cardiovascular;  Laterality: N/A;   normal LV function, ef 55-60%,  mild AR and MR, mild dilated ascending aorta (4.2cm),  mild to moderate atherosclerosis  descending aorta,  mild to moderate TR,  negative saline microcavitation study   THYROIDECTOMY Right 01/20/2015   Procedure: RIGHT THYROIDECTOMY;  Surgeon: Ana LELON Moccasin, MD;  Location: [facility] OR;  Service: ENT;  Laterality: Right;   TONSILLECTOMY  as child   TOTAL HIP ARTHROPLASTY Left 12/14/2020   Procedure: TOTAL HIP  ARTHROPLASTY ANTERIOR APPROACH;  Surgeon: Beverley Evalene BIRCH, MD;  Location: [facility];  Service: Orthopedics;  Laterality: Left;   TRANSURETHRAL RESECTION OF BLADDER TUMOR N/A 05/15/2016   Procedure: TRANSURETHRAL RESECTION OF BLADDER TUMOR (TURBT) AND INSTILLATION OF EPIRUBICIN ;  Surgeon: Senior Matilda, MD;  Location[facility];  Service: Urology;  Laterality: N/A;   TRANSURETHRAL RESECTION OF BLADDER TUMOR N/A 03/29/2020   Procedure: TRANSURETHRAL RESECTION OF BLADDER TUMOR (TURBT);  Surgeon: Matilda Senior, MD;  Location: [facility]S;  Service: Urology;  Laterality: N/A;   TRANSURETHRAL RESECTION OF BLADDER TUMOR WITH MITOMYCIN -C N/A 01/23/2022   Procedure: TRANSURETHRAL RESECTION OF BLADDER TUMOR WITH POST OPERATIVE GEMCITABINE  INSTILLATION;  Surgeon: Matilda Senior, MD;  Location: [facility];  Service: Urology;  Laterality: N/A;   URETEROSCOPY Left 11/12/2023   Procedure: URETEROSCOPY;  Surgeon: Matilda Senior, MD;  Location: [facility]S;  Service: Urology;  Laterality: Left;      Inpatient Medications: Scheduled Meds:  apixaban   5 mg Oral BID   atorvastatin   20 mg Oral Daily   Chlorhexidine  Gluconate Cloth  6 each Topical Daily   feeding supplement  237 mL Oral BID BM   finasteride   5 mg Oral q AM   fluticasone  furoate-vilanterol  1 puff Inhalation Daily   melatonin  6 mg Oral QHS   methylPREDNISolone  (SOLU-MEDROL ) injection  40 mg Intravenous Q12H   sertraline   50 mg Oral q morning   sodium chloride  flush  3 mL Intravenous Q12H   sodium chloride  flush  3 mL Intravenous Q12H   traZODone   50 mg Oral QHS   verapamil   240 mg Oral q AM   Continuous Infusions:  azithromycin  Stopped (12/30/23 1003)   cefTRIAXone  (ROCEPHIN )  IV Stopped (12/31/23 0250)   diltiazem  (CARDIZEM ) infusion 15 mg/hr (12/31/23 0700)   PRN Meds: acetaminophen  **OR** acetaminophen , bisacodyl , haloperidol  lactate, hydrALAZINE , HYDROmorphone  (DILAUDID ) injection, ipratropium, levalbuterol ,  ondansetron  **OR** ondansetron  (ZOFRAN ) IV, mouth rinse, oxyCODONE , senna-docusate, sodium phosphate   Allergies:    Allergies  Allergen Reactions   Flomax [Tamsulosin Hcl] Nausea And Vomiting and Other (See Comments)    Pt thought he was dying  DIZZY   Fentanyl  Other (See Comments)    Makes me combative  Pt states he's had it since then and was fine 10-19-23    Social History:   Social History   Socioeconomic History   Marital status: Married    Spouse name: Not on file   Number of children: 3   Years of education: 14   Highest education level: Not on file  Occupational History   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    Comment: retired  Tobacco Use   Smoking status: Former    Current packs/day: 0.00    Average packs/day: 1 pack/day for 45.0 years (45.0 ttl pk-yrs)    Types: Cigarettes    Start date: 09/26/1965    Quit date: 09/26/2010    Years since quitting: 13.2   Smokeless tobacco: Never  Vaping Use   Vaping status: Never Used  Substance and Sexual Activity   Alcohol use: No   Drug use: Never   Sexual activity: Not Currently  Other Topics Concern   Not on file  Social History Narrative   Widowed, lives with dog, Jake in [place], wife passed from Parkinson's disease 3 1/2 yrs ago   1 cup coffee daily   Social Drivers of Corporate Investment Banker Strain: Not on file  Food Insecurity: No Food Insecurity (12/29/2023)   Hunger Vital Sign    Worried About Running Out of Food in the Last Year: Never true    Ran Out of Food in the Last Year: Never true  Transportation Needs: No Transportation Needs (12/29/2023)   PRAPARE - Administrator, Civil Service (Medical): No    Lack of Transportation (Non-Medical): No  Physical Activity: Not on file  Stress: Not on file  Social Connections: Socially Integrated (12/29/2023)   Social Connection and Isolation Panel [NHANES]    Frequency of Communication with Friends and Family: Once a week    Frequency of Social  Gatherings with Friends and Family: Three times a week    Attends Religious Services: 1 to 4 times per year    Active Member of Clubs or Organizations: Yes    Attends Banker Meetings: Never    Marital Status: Married  Catering Manager Violence: Not At Risk (12/29/2023)   Humiliation, Afraid, Rape, and Kick questionnaire    Fear  of Current or Ex-Partner: No    Emotionally Abused: No    Physically Abused: No    Sexually Abused: No    Family History:    Family History  Problem Relation Age of Onset   Stroke Sister    Breast cancer Sister    Stroke Brother      ROS:  Please see the history of present illness.   All other ROS reviewed and negative.     Physical Exam/Data:   Vitals:   12/31/23 0445 12/31/23 0500 12/31/23 0600 12/31/23 0700  BP:  121/70 94/60 124/83  Pulse: (!) 123 72 (!) 119 84  Resp: (!) 24 20 (!) 21 18  Temp:  97.7 F (36.5 C)    TempSrc:  Axillary    SpO2: 93% 97% 100% 94%  Weight:      Height:        Intake/Output Summary (Last 24 hours) at 12/31/2023 0737 Last data filed at 12/31/2023 0700 Gross per 24 hour  Intake 691.07 ml  Output 550 ml  Net 141.07 ml      12/31/2023    4:00 AM 12/30/2023    5:00 AM 12/29/2023    6:33 AM  Last 3 Weights  Weight (lbs) 196 lb 3.4 oz 190 lb 7.6 oz 193 lb  Weight (kg) 89 kg 86.4 kg 87.544 kg     Body mass index is 26.61 kg/m.  General:  Well nourished, well developed, in no acute distress HEENT: normal Neck: mildly elevated JVD Vascular: No carotid bruits; Distal pulses 2+ bilaterally Cardiac:  irregular, tachy Lungs:  clear to auscultation bilaterally, no wheezing, rhonchi or rales  Abd: soft, nontender, no hepatomegaly  Ext: no edema Musculoskeletal:  No deformities, BUE and BLE strength normal and equal Skin: warm and dry  Neuro:  CNs 2-12 intact, no focal abnormalities noted Psych:  Normal affect     Laboratory Data:  High Sensitivity Troponin:  No results for input(s): TROPONINIHS in  the last 720 hours.   Chemistry Recent Labs  Lab 12/29/23 0215 12/30/23 0554 12/31/23 0435  NA 137 140 141  K 3.7 4.1 4.1  CL 102 104 104  CO2 26 27 24   GLUCOSE 156* 174* 182*  BUN 32* 31* 40*  CREATININE 1.66* 1.32* 1.50*  CALCIUM  8.9 8.8* 9.1  MG 2.3  --   --   GFRNONAA 41* 54* 46*  ANIONGAP 9 9 13     Recent Labs  Lab 12/29/23 0215 12/30/23 0554 12/31/23 0435  PROT 6.8 6.1* 6.7  ALBUMIN 3.0* 2.7* 3.0*  AST 31 31 39  ALT 20 21 26   ALKPHOS 68 60 70  BILITOT 1.2 0.8 0.5   Lipids No results for input(s): CHOL, TRIG, HDL, LABVLDL, LDLCALC, CHOLHDL in the last 168 hours.  Hematology Recent Labs  Lab 12/29/23 0215 12/30/23 0554 12/31/23 0435  WBC 10.2 8.0 13.8*  RBC 4.10* 3.60* 3.96*  HGB 12.6* 11.0* 12.2*  HCT 40.1 35.6* 39.0  MCV 97.8 98.9 98.5  MCH 30.7 30.6 30.8  MCHC 31.4 30.9 31.3  RDW 13.0 12.9 12.9  PLT 267 258 334   Thyroid   Recent Labs  Lab 12/29/23 0215  TSH 0.835    BNP Recent Labs  Lab 12/29/23 0215  BNP 585.0*    DDimer No results for input(s): DDIMER in the last 168 hours.   Radiology/Studies:  CT Angio Chest PE W and/or Wo Contrast Result Date: 12/29/2023 CLINICAL DATA:  83 year old male with history of shortness of breath. Low  oxygen saturation. Evaluate for pulmonary embolism. EXAM: CT ANGIOGRAPHY CHEST WITH CONTRAST TECHNIQUE: Multidetector CT imaging of the chest was performed using the standard protocol during bolus administration of intravenous contrast. Multiplanar CT image reconstructions and MIPs were obtained to evaluate the vascular anatomy. RADIATION DOSE REDUCTION: This exam was performed according to the departmental dose-optimization program which includes automated exposure control, adjustment of the mA and/or kV according to patient size and/or use of iterative reconstruction technique. CONTRAST:  60mL OMNIPAQUE  IOHEXOL  350 MG/ML SOLN COMPARISON:  Chest CT 11/02/2023. FINDINGS: Comment: Today's study is severely  limited for assessment of pulmonary embolism by suboptimal contrast bolus, excessive image artifact from the patient being imaged with the arms in the down position, and extensive patient respiratory motion. Cardiovascular: Within the limitations of today's examination, there is no central or lobar sized filling defect to suggest large pulmonary embolism. Unfortunately, secondary to the limitations of today's examination, segmental and subsegmental sized filling defects can not entirely be excluded. Heart size is normal. There is no significant pericardial fluid, thickening or pericardial calcification. There is aortic atherosclerosis, as well as atherosclerosis of the great vessels of the mediastinum and the coronary arteries, including calcified atherosclerotic plaque in the left main, left anterior descending, left circumflex and right coronary arteries. Thickening and calcification of the aortic valve. Aneurysmal dilatation of the ascending thoracic aorta (4.7 cm in diameter), unchanged. Mediastinum/Nodes: No pathologically enlarged mediastinal or hilar lymph nodes. Esophagus is unremarkable in appearance. No axillary lymphadenopathy. Lungs/Pleura: Assessment of the lung parenchyma is substantially limited by patient respiratory motion. However, there are clearly widespread areas of thickening of the peribronchovascular interstitium with extensive peribronchovascular ground-glass attenuation and consolidative changes scattered throughout the lungs bilaterally, indicative of widespread bilateral multilobar bronchopneumonia, most severe in the right upper and right lower lobes. No pleural effusions. Upper Abdomen: Aortic atherosclerosis. Exophytic 3 cm low-attenuation lesion in the upper pole of the left kidney, incompletely characterized on today's noncontrast CT examination, but statistically likely cysts (no imaging follow-up recommended). Numerous calcified granulomas in the spleen incidentally noted.  Musculoskeletal: There are no aggressive appearing lytic or blastic lesions noted in the visualized portions of the skeleton. Review of the MIP images confirms the above findings. IMPRESSION: 1. Severely limited examination demonstrating no central or lobar sized pulmonary embolism. Accurate assessment for smaller segmental and subsegmental sized emboli is not possible on the basis of today's examination. 2. Severe multilobar bilateral bronchopneumonia, as above. 3. Aortic atherosclerosis, in addition to left main and three-vessel coronary artery disease. Aneurysmal dilatation of the ascending thoracic aorta (4.7 cm in diameter), similar to the prior study. Ascending thoracic aortic aneurysm. Recommend semi-annual imaging followup by CTA or MRA and referral to cardiothoracic surgery if not already obtained. This recommendation follows 2010 ACCF/AHA/AATS/ACR/ASA/SCA/SCAI/SIR/STS/SVM Guidelines for the Diagnosis and Management of Patients With Thoracic Aortic Disease. Circulation. 2010; 121: [id number]. Aortic aneurysm NOS (ICD10-I71.9). Aortic Atherosclerosis (ICD10-I70.0). Aortic aneurysm NOS (ICD10-I71.9). Electronically Signed   By: Toribio Aye M.D.   On: 12/29/2023 07:13   DG Chest Portable 1 View Result Date: 12/29/2023 CLINICAL DATA:  Shortness of breath, palpitations EXAM: PORTABLE CHEST 1 VIEW COMPARISON:  10/19/2023 FINDINGS: Heart and mediastinal contours within normal limits. Loop recorder device in place. Diffuse interstitial prominence in the lower lobes. No effusions or acute bony abnormality. IMPRESSION: Diffuse interstitial prominence in the lower lobes could reflect edema or infection. Electronically Signed   By: Franky Crease M.D.   On: 12/29/2023 03:16     Assessment and Plan:  1.PAF - EKG 08/2022 SR, 09/2023 afib and this admit afib. Probably persistent afib - presented with afib with RVR in setting of sepsis - was started on dilt gtt for rate control - he is on eliquis  for stroke  prevention  - on dilt 15, SBP low 100s, rates 120s to 130s. Looks like not able to rate control - plan for IV amiodarone , overall short course given lung disease. When sepsis resolves afib drive should also decrease.     2. Sepsis/multifocal pneumonia - per primary team   3. COPD  - on home O2 4L, exacerbation in setting of pneumonia     For questions or updates, please contact [facility] Please consult [email or web address] for contact info under    Signed, Alvan Carrier, MD  12/31/2023 7:37 AM

## 2023-12-31 NOTE — Progress Notes (Signed)
 PROGRESS NOTE    Patient: Kyle Owen                            PCP: Sheryle Carwin, MD                    DOB: Dec 15, 1941            DOA: 12/29/2023 FMW:[id number]             DOS: 12/31/2023, 10:35 AM   LOS: 2 days   Date of Service: The patient was seen and examined on 12/31/2023  Subjective:   The patient was seen and examined this morning, stable no acute distress, resting comfortably. 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 Presenting today with progressive shortness of breath, hypoxia on 2-4 L satting 88%... Improved to 90% on 6 L, also complaining of palpitation and dizziness.   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    Assessment & Plan:   Principal Problem:   Sepsis (HCC) Active Problems:   Paroxysmal atrial fibrillation (HCC)   Acute on chronic respiratory failure with hypoxia (HCC)   Thoracic ascending aortic aneurysm (HCC)   Advanced dementia (HCC)    Chronic kidney disease, stage 3b (HCC)   COPD with chronic bronchitis and emphysema (HCC)   Chronic respiratory failure with hypoxia (HCC)   Anxiety   Hyperlipidemia     Assessment and Plan: * Sepsis (HCC)-due to multifocal pneumonia POA: Met sepsis on admission Continue to have improvement in sepsis physiology still requiring 3 L of oxygen, satting 97%, Creatinine was 1.5, WBC 13.8, still in tachyarrhythmia-A-fib with RVR  Creatinine 1.32, lactic acid 1.1, Influenza A/B, SARS-CoV-2 negative  -Source of infection multifocal pneumonia  -2 sets of blood cultures -Will try to obtain sputum cultures  CT angiogram multiple artifact negative for any obvious pulmonary embolism, multilobar pneumonia , 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 Chest x-ray: Diffuse superficial prominence in the lower lobe could reflect edema or infection  Continue to monitor in ICU setting for A-fib with RVR     Acute on chronic respiratory failure with hypoxia (HCC) -O2 demand as high as 6 L now down to 3 L, satting 97% Due to multifocal pneumonia and COPD exacerbation -Continue with current antibiotics -Bronchodilators as needed  -IV steroids>>> tapering down  Paroxysmal atrial fibrillation (HCC) Currently in A-fib with RVR Now persistent atrial fibrillation, chronically anticoagulated with Eliquis  Home rate controlled medication verapamil , -On this admission was started on Cardizem  drip >>> cardiology consulted, switched to amiodarone   drip today 12/31/2023  Advanced dementia (HCC) With behavior disturbances currently not on any chronic medication with exception of sertraline  Will utilize as needed Haldol  versus Ativan  Thoracic ascending aortic aneurysm (HCC) -Chronic history of thoracic ascending aortic aneurysm -Based on today's CTA 4.7 cm no changes -Follow-up as an outpatient and monitor closely  Chronic kidney disease, stage 3b (HCC) Monitoring BUN/creatinine closely currently at  baseline Waiting nephrotoxins-will adjust medication renally Lab Results  Component Value Date   CREATININE 1.50 (H) 12/31/2023   CREATININE 1.32 (H) 12/30/2023   CREATININE 1.66 (H) 12/29/2023     Hyperlipidemia Will continue statins  Anxiety As needed Haldol /Ativan  Chronic respiratory failure with hypoxia (HCC)/COPD History of chronic respiratory failure with COPD, O2 dependent 4 L,   Management as above     ----------------------------------------------------------------------------------------------------------------------------------------------- Nutritional status:  The patient's BMI is: Body mass index is 26.61 kg/m. I agree with the assessment and plan as outlined   --------------------------------------------------------------------------------------------------------------------------------------------- Cultures; Blood Cultures x 2 >>     ------------------------------------------------------------------------------------------------------------------------------------------------  DVT prophylaxis:  SCDs Start: 12/29/23 0750 apixaban  (ELIQUIS ) tablet 5 mg   Code Status:   Code Status: Limited: Do not attempt resuscitation (DNR) -DNR-LIMITED -Do Not Intubate/DNI   Family Communication: Wife present at bedside-updated -Advance care planning has been discussed.   Admission status:   Status is: Inpatient Remains inpatient appropriate because: Needing IV antibiotics   Disposition: From  - home             Planning for discharge in 2 days: to home with home health  Procedures:   No admission procedures for hospital encounter.   Antimicrobials:  Anti-infectives (From admission, onward)    Start     Dose/Rate Route Frequency Ordered Stop   12/30/23 0300  cefTRIAXone  (ROCEPHIN ) 2 g in sodium chloride  0.9 % 100 mL IVPB        2 g 200 mL/hr over 30 Minutes Intravenous Every 24 hours 12/29/23 0755 01/03/24 0259   12/29/23 0900  azithromycin   (ZITHROMAX ) 500 mg in sodium chloride  0.9 % 250 mL IVPB        500 mg 250 mL/hr over 60 Minutes Intravenous Every 24 hours 12/29/23 0755 01/03/24 0859   12/29/23 0330  cefTRIAXone  (ROCEPHIN ) 2 g in sodium chloride  0.9 % 100 mL IVPB        2 g 200 mL/hr over 30 Minutes Intravenous  Once 12/29/23 0322 12/29/23 0410   12/29/23 0330  azithromycin  (ZITHROMAX ) 500 mg in sodium chloride  0.9 % 250 mL IVPB  Status:  Discontinued        500 mg 250 mL/hr over 60 Minutes Intravenous Every 24 hours 12/29/23 0322 12/29/23 0755        Medication:   amiodarone   150 mg Intravenous Once   apixaban   5 mg Oral BID   atorvastatin   20 mg Oral Daily   Chlorhexidine  Gluconate Cloth  6 each Topical Daily   feeding supplement  237 mL Oral BID BM   finasteride   5 mg Oral q AM   fluticasone  furoate-vilanterol  1 puff Inhalation Daily   melatonin  6 mg Oral QHS   methylPREDNISolone  (SOLU-MEDROL ) injection  40 mg Intravenous Q24H   sertraline   50 mg Oral q morning   sodium chloride  flush  3 mL Intravenous Q12H   sodium chloride  flush  3 mL Intravenous Q12H   traZODone   50 mg Oral QHS    acetaminophen  **OR** acetaminophen , bisacodyl , haloperidol  lactate, hydrALAZINE , HYDROmorphone  (DILAUDID ) injection, ipratropium, levalbuterol , ondansetron  **OR** ondansetron  (  ZOFRAN ) IV, mouth rinse, oxyCODONE , senna-docusate, sodium phosphate    Objective:   Vitals:   12/31/23 0843 12/31/23 0859 12/31/23 0900 12/31/23 1000  BP:  101/65 101/65 100/64  Pulse:  (!) 123 (!) 123 (!) 114  Resp:  16 15 16   Temp: 97.6 F (36.4 C)     TempSrc: Axillary     SpO2:  97%    Weight:      Height:        Intake/Output Summary (Last 24 hours) at 12/31/2023 1035 Last data filed at 12/31/2023 0914 Gross per 24 hour  Intake 497.03 ml  Output --  Net 497.03 ml   Filed Weights   12/29/23 0633 12/30/23 0500 12/31/23 0400  Weight: 87.5 kg 86.4 kg 89 kg     Physical examination:        General:  Somnolent, responds to pain  stimuli  HEENT:  Normocephalic, PERRL, otherwise with in Normal limits   Neuro:  CNII-XII intact. , normal motor and sensation, reflexes intact   Lungs:   Clear to auscultation BL, Respirations unlabored,  No wheezes / crackles  Cardio:    S1/S2, RRR, No murmure, No Rubs or Gallops   Abdomen:  Soft, non-tender, bowel sounds active all four quadrants, no guarding or peritoneal signs.  Muscular  skeletal:  Limited exam -global generalized weaknesses - in bed, able to move all 4 extremities,   2+ pulses,  symmetric, No pitting edema  Skin:  Dry, warm to touch, negative for any Rashes,  Wounds: Please see nursing documentation          ------------------------------------------------------------------------------------------------------------------------------------------    LABs:     Latest Ref Rng & Units 12/31/2023    4:35 AM 12/30/2023    5:54 AM 12/29/2023    2:15 AM  CBC  WBC 4.0 - 10.5 K/uL 13.8  8.0  10.2   Hemoglobin 13.0 - 17.0 g/dL 87.7  88.9  87.3   Hematocrit 39.0 - 52.0 % 39.0  35.6  40.1   Platelets 150 - 400 K/uL 334  258  267       Latest Ref Rng & Units 12/31/2023    4:35 AM 12/30/2023    5:54 AM 12/29/2023    2:15 AM  CMP  Glucose 70 - 99 mg/dL 817  825  843   BUN 8 - 23 mg/dL 40  31  32   Creatinine 0.61 - 1.24 mg/dL 8.49  8.67  8.33   Sodium 135 - 145 mmol/L 141  140  137   Potassium 3.5 - 5.1 mmol/L 4.1  4.1  3.7   Chloride 98 - 111 mmol/L 104  104  102   CO2 22 - 32 mmol/L 24  27  26    Calcium  8.9 - 10.3 mg/dL 9.1  8.8  8.9   Total Protein 6.5 - 8.1 g/dL 6.7  6.1  6.8   Total Bilirubin 0.0 - 1.2 mg/dL 0.5  0.8  1.2   Alkaline Phos 38 - 126 U/L 70  60  68   AST 15 - 41 U/L 39  31  31   ALT 0 - 44 U/L 26  21  20         Micro Results Recent Results (from the past 240 hours)  Culture, blood (single)     Status: None (Preliminary result)   Collection Time: 12/29/23  7:48 AM   Specimen: Left Antecubital; Blood  Result Value Ref Range Status   Specimen  Description   Final  LEFT ANTECUBITAL BOTTLES DRAWN AEROBIC AND ANAEROBIC   Special Requests   Final    Blood Culture results may not be optimal due to an inadequate volume of blood received in culture bottles   Culture   Final    NO GROWTH 2 DAYS Performed at [facility], [address] [place], [place] [address]    Report Status PENDING  Incomplete  Culture, blood (x 2)     Status: None (Preliminary result)   Collection Time: 12/29/23  8:10 AM   Specimen: BLOOD LEFT HAND  Result Value Ref Range Status   Specimen Description   Final    BLOOD LEFT HAND BOTTLES DRAWN AEROBIC AND ANAEROBIC   Special Requests Blood Culture adequate volume  Final   Culture   Final    NO GROWTH 2 DAYS Performed at [facility], [address] [place], [place] [address]    Report Status PENDING  Incomplete  Resp panel by RT-PCR (RSV, Flu A&B, Covid) Anterior Nasal Swab     Status: None   Collection Time: 12/29/23  8:22 AM   Specimen: Anterior Nasal Swab  Result Value Ref Range Status   SARS Coronavirus 2 by RT PCR NEGATIVE NEGATIVE Final    Comment: (NOTE) SARS-CoV-2 target nucleic acids are NOT DETECTED.  The SARS-CoV-2 RNA is generally detectable in upper respiratory specimens during the acute phase of infection. The lowest concentration of SARS-CoV-2 viral copies this assay can detect is 138 copies/mL. A negative result does not preclude SARS-Cov-2 infection and should not be used as the sole basis for treatment or other patient management decisions. A negative result may occur with  improper specimen collection/handling, submission of specimen other than nasopharyngeal swab, presence of viral mutation(s) within the areas targeted by this assay, and inadequate number of viral copies(<138 copies/mL). A negative result must be combined with clinical observations, patient history, and epidemiological information. The expected result is Negative.  Fact Sheet for Patients:   bloggercourse.com  Fact Sheet for Healthcare Providers:  seriousbroker.it  This test is no t yet approved or cleared by the [place]  FDA and  has been authorized for detection and/or diagnosis of SARS-CoV-2 by FDA under an Emergency Use Authorization (EUA). This EUA will remain  in effect (meaning this test can be used) for the duration of the COVID-19 declaration under Section 564(b)(1) of the Act, 21 U.S.C.section 360bbb-3(b)(1), unless the authorization is terminated  or revoked sooner.       Influenza A by PCR NEGATIVE NEGATIVE Final   Influenza B by PCR NEGATIVE NEGATIVE Final    Comment: (NOTE) The Xpert Xpress SARS-CoV-2/FLU/RSV plus assay is intended as an aid in the diagnosis of influenza from Nasopharyngeal swab specimens and should not be used as a sole basis for treatment. Nasal washings and aspirates are unacceptable for Xpert Xpress SARS-CoV-2/FLU/RSV testing.  Fact Sheet for Patients: bloggercourse.com  Fact Sheet for Healthcare Providers: seriousbroker.it  This test is not yet approved or cleared by the [place]  FDA and has been authorized for detection and/or diagnosis of SARS-CoV-2 by FDA under an Emergency Use Authorization (EUA). This EUA will remain in effect (meaning this test can be used) for the duration of the COVID-19 declaration under Section 564(b)(1) of the Act, 21 U.S.C. section 360bbb-3(b)(1), unless the authorization is terminated or revoked.     Resp Syncytial Virus by PCR NEGATIVE NEGATIVE Final    Comment: (NOTE) Fact Sheet for Patients: bloggercourse.com  Fact Sheet for Healthcare Providers: seriousbroker.it  This test  is not yet approved or cleared by the [place]  FDA and has been authorized for detection and/or diagnosis of SARS-CoV-2 by FDA under an Emergency Use  Authorization (EUA). This EUA will remain in effect (meaning this test can be used) for the duration of the COVID-19 declaration under Section 564(b)(1) of the Act, 21 U.S.C. section 360bbb-3(b)(1), unless the authorization is terminated or revoked.  Performed at [facility], [address], [place], [place] [address]   MRSA Next Gen by PCR, Nasal     Status: None   Collection Time: 12/29/23  1:13 PM   Specimen: Nasal Mucosa; Nasal Swab  Result Value Ref Range Status   MRSA by PCR Next Gen NOT DETECTED NOT DETECTED Final    Comment: (NOTE) The GeneXpert MRSA Assay (FDA approved for NASAL specimens only), is one component of a comprehensive MRSA colonization surveillance program. It is not intended to diagnose MRSA infection nor to guide or monitor treatment for MRSA infections. Test performance is not FDA approved in patients less than 42 years old. Performed at [facility], [address], [place], [place] [address]     Radiology Reports No results found.  SIGNED: Adriana DELENA Grams, MD, FHM. FAAFP. Jolynn Pack - [facility] Critical care time spent - 55 min.  In seeing, evaluating and examining the patient. Reviewing medical records, labs, drawn plan of care. [facility],  Pager (please use [email or web address] to page/ text) Please use Epic Secure Chat for non-urgent communication (7AM-7PM)  If 7PM-7AM, please contact night-coverage [email or web address], 12/31/2023, 10:35 AM

## 2023-12-31 NOTE — Consult Note (Addendum)
 Palliative Care Consult Note                                  Date: 12/31/2023   Patient Name: Kyle Owen  DOB: October 20, 1941  MRN: [id number]  Age / Sex: 83 y.o., male  PCP: Sheryle Carwin, MD Referring Physician: Willette Adriana DELENA, MD  Reason for Consultation: Establishing goals of care  HPI/Patient Profile: 83 y.o. 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 He presented with progressive shortness of breath, hypoxia on 2-4 L satting 88% improving to 90% on 6 L, as well as palpitation and dizziness. He was admitted on 12/29/2023 with sepsis due to multifocal pneumonia, acute on chronic respiratory failure with hypoxia, paroxysmal A-fib with RVR, advanced dementia, CKD 3b, and others.   Palliative medicine was consulted for GOC conversations.  Past Medical History:  Diagnosis Date   A-fib (HCC)    Anticoagulant long-term use    eliquis --- managed by cardiology   Benign prostatic hypertrophy with urinary frequency    Bladder cancer (HCC)    Chronic respiratory failure with hypoxia (HCC)    Complication of anesthesia    gets combative in recovery   COPD with chronic bronchitis and emphysema (HCC)    pulmonologist--- dr jude;  nocturnal oxygen,  no daytime oxygen use   Dementia (HCC)    Dependence on nocturnal oxygen therapy    Depression    Dyspnea    when walking   History of colon polyps    History of kidney stones    History of loop recorder    loop recorder in place battery is dead has not had changed due to covid   History of TIA (transient ischemic attack)    (per pt no residual ) per neurologist note (dr margaret 11-08-2015) dx cryptogenic TIA versus arrhythia versus dysautonomia (right ophthalmic artery TIA and x3 posterior circulation TIAs   Hyperlipidemia     Hypertension    Pt denies   Malignant neoplasm of overlapping sites of bladder [facility])    urologist--- dr matilda   Multinodular thyroid     per pathology report 11-12-2014 bilateral thyroid   benign multinoduler follicular adenoma and hyperplastic    Nephrolithiasis    OA (osteoarthritis)    DJD right knee   Ocular migraine    controlled w/ verapamil    On home oxygen therapy    PAF (paroxysmal atrial fibrillation) (HCC)    followed by AFIB clinic-- cardiologist-- dr jeffrie   Pneumonia    2023   Pulmonary nodule, left    left lower lobe x2 per CT 01-20-2016   Renal cyst, left    Stroke [facility])    Thoracic aortic aneurysm without rupture [facility])    followed by dr fleeta tright ;   ascending -- ct chest 01-24-2023   4.8cm   Wears hearing aid in both ears    wear at times    Subjective:   This NP Camellia Kays reviewed medical records, received report from team, assessed the patient and then meet at the patient's bedside to discuss diagnosis, prognosis, GOC, EOL wishes disposition and options.  I met with the patient at the bedside, although he is alert, communicative, and pleasant, he is notably confused.  His daughter Kyle Owen (who lives in [place] )  is at his bedside.  Kyle Owen and I spent some time conversing with the patient at the bedside, then excused ourselves to the conference room for further discussions with the patient's nurse sitting bedside to keep an eye on him.   We meet to discuss diagnosis prognosis, GOC, EOL wishes, disposition and options. Concept of Palliative Care was introduced as specialized medical care for people and their families living with serious illness.  If focuses on providing relief from the symptoms and stress of a serious illness.  The goal is to improve quality of life for both the patient and the family. Values and goals of care important to patient and family were attempted to be elicited.  Created space and opportunity for patient  and family to explore thoughts  and feelings regarding current medical situation   Natural trajectory and current clinical status were discussed. Questions and concerns addressed. Patient  encouraged to call with questions or concerns.    Patient/Family Understanding of Illness: Kyle Owen has a very firm understanding of her father's chronic illnesses.  She is aware of his A-fib now with RVR, chronic kidney disease, and dementia.  She notes that her dementia progressed quite slowly for few years although recently there has been more decline.  Specifically in the past couple months there has been a snowball effect which commenced late October after hospitalization for similar illness as today.  She does have questions about what his baseline oxygen should be.  She notes she has been told 4 L/min by the hospital staff, although the patient's wife at home notes 2 L/min.  I encouraged her to reach out to his pulmonologist to get their confirmation/recommendation.  Life Review: The patient was previously married, although his first wife passed.  He is now remarried to his second wife.  He does currently live at home with his wife.  He lives in [place], [place] .  He loves puzzles and playing cards, although this is becoming more difficult with his progressive dementia, especially as of late.  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  Patient Values: Family  Goals: To get better from acute illness, discharge to home, look at care options including in-home care versus admission to memory care for patient safety.  Today's Discussion: In addition to discussions described above we had substantial discussion on various topics.  Prior to meeting with Kyle Owen separately we are spending some time conversing with the patient.  He does know his name but thinks he is currently on an airplane and it is not sometime in the 1970s.  At 1 point he mentions that he is 83 years old and I do not need to keep flying like this.  Of note, he is 83 years  old and not currently flying.  Kyle Owen fetter notes that they nicknamed him the $6 million man because of all the rods and pins that he has had placed.  Kyle Owen notes that she lives in [place]  but will be staying down here for about a month.  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  Her Sister Kyle Owen is also helping in a generally trade off shifts.  We discussed noted HCPOA documentation in ACP tab/Vynca.  Kyle Owen confirms that she is the first HCPOA, Kyle Owen a second HCPOA.  Kyle Owen (her brother) was third HCPOA although he passed away last year.  I confirmed that there is no need to redo documentation, especially given patient's dementia he would be unable to  sign legal documents.  Current HCPOA should suffice.  He lives at home with his wife, although he lives downstairs and she lives upstairs.  Recently the wife has begun walking her bedroom door with concerns of safety given the patient's progressive dementia.  She notes that he has at times wandered outside and fallen.  Family is growing increasingly concerned for his safety as [place].  Additionally, she states something happened to his long oxygen tubing (apparently looks cut?  She states that he has been doing stuff like this lately).  So the patient was tethered to a chair with short oxygen tubing which she had removed to go to the bathroom.  Of note when her family was looking through the house I did find longer oxygen tubing in the closet and they are not sure why this was not retrieved for him.    Kyle Owen shares they have started looking for increasing care options such as care at home part-time (especially at night) versus admission to memory care.  She was always hopeful to keep him at home as long as possible.  They also note concerns about guns in the house and they have taken steps to try to secure these.  We shared that being a caregiver is exceptionally hard especially as dementia becomes  progressive.  I acknowledged her concerns for safety.  After discussing Kyle Owen states that they may look for in-home care initially, but she is excepting that eventually he may need to be admitted to memory care for safety and quality of life.  We did share the trajectory and progressive, insidious, irreversible nature of dementia.  We shared that acute illness can exacerbate and accelerate dementia progression, which appears to have happened in this case.  She understands that he will continue to get worse.  This is why she is looking for other care options to help promote best quality of life for him.  We discussed CODE STATUS and confirmed desire for DNR/DNI status.  We did discuss the need for future conversations as the patient's dementia progresses.  He appears to be improving from his acute illness.  However, his dementia will remain in progress.  I shared at some point they will need conversations about comfort care and hospice care, although that they may not be today.  She agrees with this.  I shared recommendation for outpatient palliative care to follow along his illness trajectory, which can be done in the home or in memory care if he is admitted at some point.  I shared that they can help with goals of care conversations as his health declines including triggering hospice services when appropriate and family is ready.  Kyle Owen agrees to consulting outpatient palliative care at this time.  At this point we went back to the patient's room states using to be with her father.  I shared that I would follow-up in a couple days to check on his progress.  I provided multiple resources including a contact card with our phone number for any questions or concerns, a copy of the book hard choices for loving people and handout resources on palliative care versus hospice care.  I provided emotional and general support through therapeutic listening, empathy, sharing of stories, therapeutic touch, and other  techniques. I answered all questions and addressed all concerns to the best of my ability.  ROS Limited due to altered mental status Review of Systems  Constitutional:        Denies pain in general  Respiratory:  Negative for shortness of breath.   Gastrointestinal:  Negative for abdominal pain.    Objective:   Primary Diagnoses: Present on Admission:  Sepsis (HCC)  Chronic kidney disease, stage 3b (HCC)  Chronic respiratory failure with hypoxia (HCC)  Thoracic ascending aortic aneurysm (HCC)  COPD with chronic bronchitis and emphysema (HCC)  Anxiety  Paroxysmal atrial fibrillation (HCC)  Advanced dementia (HCC)  Hyperlipidemia  Acute on chronic respiratory failure with hypoxia (HCC)   Physical Exam Vitals and nursing note reviewed.  Constitutional:      General: He is not in acute distress. HENT:     Head: Normocephalic and atraumatic.  Cardiovascular:     Rate and Rhythm: Normal rate.  Pulmonary:     Effort: Pulmonary effort is normal. No respiratory distress.  Abdominal:     General: Abdomen is flat. There is no distension.     Palpations: Abdomen is soft.  Skin:    General: Skin is warm and dry.  Neurological:     Mental Status: He is alert. He is disoriented and confused.     Comments: Oriented to person only  Psychiatric:     Comments: Fidgety behavior     Vital Signs:  BP (!) 142/81   Pulse (!) 126   Temp 98.1 F (36.7 C) (Oral)   Resp 19   Ht 6' (1.829 m)   Wt 89 kg   SpO2 96%   BMI 26.61 kg/m   Palliative Assessment/Data: 30-40%    Advanced Care Planning:   Existing Vynca/ACP Documentation: Advance directive signed 09/14/2020  Primary Decision Maker: NEXT OF KIN  Code Status/Advance Care Planning: DNR-limited  A discussion was had today regarding advanced directives. Concepts specific to code status, artifical feeding and hydration, continued IV antibiotics and rehospitalization was had.  The difference between a aggressive medical  intervention path and a palliative comfort care path for this patient at this time was had.   Decisions/Changes to ACP: None today  Assessment & Plan:   Impression: 83 year old male with acute presentation of chronic comorbidities as described above.  His underlying issue is multiple chronic comorbidities including advanced dementia that seems to be rapidly progressing as of late.  This is likely due to a couple of recent hospitalizations.  His acute picture including sepsis due to pneumonia seems to be improving.  I feel he will discharge from the hospital from his acute presentation, although his dementia will continue to progress.  Daughter/HCPOA seems to understand the situation quite well.  They are agreeable to outpatient palliative care to follow along, or looking at other care options as his dementia progresses including in-home care versus memory care placement.  They appear to have good family support from daughters.  They do understand that at some point in the near future hospice discussions would be beneficial.  Overall long-term prognosis poor.  SUMMARY OF RECOMMENDATIONS   Remain DNR-limited Continue to treat the treatable TOC consult for referral to outpatient palliative care discharge Palliative medicine will continue to follow Please notify us  of any significant clinical change or new palliative needs in the meantime  Symptom Management:  Per primary team PMT is available to assist as needed  Prognosis:  Unable to determine  Discharge Planning:  Home with outpatient palliative care    Discussed with: Patient, family, medical team, nursing team    Thank you for allowing us  to participate in the care of [name] PMT will continue to support holistically.  Time Total: 75  min  Detailed review of medical records (labs, imaging, vital signs), medically appropriate exam, discussed with treatment team, counseling and education to patient, family, & staff,  documenting clinical information, medication management, coordination of care  Signed by: Camellia Kays, NP Palliative Medicine Team  Team Phone # [phone] (Nights/Weekends)  12/31/2023, 4:51 PM

## 2023-12-31 NOTE — Plan of Care (Signed)
  Problem: Clinical Measurements: Goal: Signs and symptoms of infection will decrease Outcome: Progressing   Problem: Respiratory: Goal: Ability to maintain adequate ventilation will improve Outcome: Progressing   Problem: Clinical Measurements: Goal: Respiratory complications will improve Outcome: Progressing   Problem: Nutrition: Goal: Adequate nutrition will be maintained Outcome: Progressing   Problem: Coping: Goal: Level of anxiety will decrease Outcome: Progressing   Problem: Pain Management: Goal: General experience of comfort will improve Outcome: Progressing   Problem: Safety: Goal: Ability to remain free from injury will improve Outcome: Progressing   Problem: Respiratory: Goal: Ability to maintain a clear airway will improve Outcome: Progressing Goal: Levels of oxygenation will improve Outcome: Progressing

## 2023-12-31 NOTE — Progress Notes (Signed)
 Patient restless, but redirectable. Bilateral wrist restraints continued. Wife continues to be supportive at bedside. Patient A/O to self / hx of dementia.   New IV placed in upper R arm. Additional IVs re dressed & wrapped with soft dressing to secure access.   Continues to be in Afib 1teens to 130s with agitation, maxed on 15mg  Diltiazem . Was able to wean slightly to 13.5mg  when patient is asleep.

## 2023-12-31 NOTE — Plan of Care (Signed)
 Patient remains on AP-2B at time of writing. Patient continues to require bilateral soft wrist restraints. Patient remains on supplemental O2 via [place]. Patient continues to remain on continuous infusion of diltiazem .   Problem: Fluid Volume: Goal: Hemodynamic stability will improve Outcome: Not Progressing   Problem: Clinical Measurements: Goal: Diagnostic test results will improve Outcome: Not Progressing Goal: Signs and symptoms of infection will decrease Outcome: Not Progressing   Problem: Respiratory: Goal: Ability to maintain adequate ventilation will improve Outcome: Not Progressing   Problem: Education: Goal: Knowledge of General Education information will improve Description: Including pain rating scale, medication(s)/side effects and non-pharmacologic comfort measures Outcome: Not Progressing   Problem: Health Behavior/Discharge Planning: Goal: Ability to manage health-related needs will improve Outcome: Not Progressing   Problem: Clinical Measurements: Goal: Ability to maintain clinical measurements within normal limits will improve Outcome: Not Progressing Goal: Will remain free from infection Outcome: Not Progressing Goal: Diagnostic test results will improve Outcome: Not Progressing Goal: Respiratory complications will improve Outcome: Not Progressing Goal: Cardiovascular complication will be avoided Outcome: Not Progressing   Problem: Activity: Goal: Risk for activity intolerance will decrease Outcome: Not Progressing   Problem: Nutrition: Goal: Adequate nutrition will be maintained Outcome: Not Progressing   Problem: Coping: Goal: Level of anxiety will decrease Outcome: Not Progressing   Problem: Elimination: Goal: Will not experience complications related to bowel motility Outcome: Not Progressing Goal: Will not experience complications related to urinary retention Outcome: Not Progressing   Problem: Pain Management: Goal: General experience of  comfort will improve Outcome: Not Progressing   Problem: Safety: Goal: Ability to remain free from injury will improve Outcome: Not Progressing   Problem: Skin Integrity: Goal: Risk for impaired skin integrity will decrease Outcome: Not Progressing   Problem: Safety: Goal: Non-violent Restraint(s) Outcome: Not Progressing   Problem: Education: Goal: Knowledge of disease or condition will improve Outcome: Not Progressing Goal: Knowledge of the prescribed therapeutic regimen will improve Outcome: Not Progressing Goal: Individualized Educational Video(s) Outcome: Not Progressing   Problem: Activity: Goal: Ability to tolerate increased activity will improve Outcome: Not Progressing Goal: Will verbalize the importance of balancing activity with adequate rest periods Outcome: Not Progressing   Problem: Respiratory: Goal: Ability to maintain a clear airway will improve Outcome: Not Progressing Goal: Levels of oxygenation will improve Outcome: Not Progressing Goal: Ability to maintain adequate ventilation will improve Outcome: Not Progressing   Problem: Education: Goal: Knowledge of disease and its progression will improve Outcome: Not Progressing Goal: Individualized Educational Video(s) Outcome: Not Progressing   Problem: Fluid Volume: Goal: Compliance with measures to maintain balanced fluid volume will improve Outcome: Not Progressing   Problem: Health Behavior/Discharge Planning: Goal: Ability to manage health-related needs will improve Outcome: Not Progressing   Problem: Nutritional: Goal: Ability to make healthy dietary choices will improve Outcome: Not Progressing   Problem: Clinical Measurements: Goal: Complications related to the disease process, condition or treatment will be avoided or minimized Outcome: Not Progressing

## 2024-01-01 DIAGNOSIS — Z7189 Other specified counseling: Secondary | ICD-10-CM | POA: Diagnosis not present

## 2024-01-01 DIAGNOSIS — A419 Sepsis, unspecified organism: Secondary | ICD-10-CM | POA: Diagnosis not present

## 2024-01-01 DIAGNOSIS — J441 Chronic obstructive pulmonary disease with (acute) exacerbation: Secondary | ICD-10-CM | POA: Diagnosis not present

## 2024-01-01 DIAGNOSIS — I4891 Unspecified atrial fibrillation: Secondary | ICD-10-CM | POA: Diagnosis not present

## 2024-01-01 DIAGNOSIS — Z515 Encounter for palliative care: Secondary | ICD-10-CM | POA: Diagnosis not present

## 2024-01-01 DIAGNOSIS — J439 Emphysema, unspecified: Secondary | ICD-10-CM

## 2024-01-01 DIAGNOSIS — J4489 Other specified chronic obstructive pulmonary disease: Secondary | ICD-10-CM

## 2024-01-01 LAB — COMPREHENSIVE METABOLIC PANEL
ALT: 30 U/L (ref 0–44)
AST: 39 U/L (ref 15–41)
Albumin: 2.5 g/dL — ABNORMAL LOW (ref 3.5–5.0)
Alkaline Phosphatase: 64 U/L (ref 38–126)
Anion gap: 5 (ref 5–15)
BUN: 45 mg/dL — ABNORMAL HIGH (ref 8–23)
CO2: 29 mmol/L (ref 22–32)
Calcium: 8.4 mg/dL — ABNORMAL LOW (ref 8.9–10.3)
Chloride: 103 mmol/L (ref 98–111)
Creatinine, Ser: 1.55 mg/dL — ABNORMAL HIGH (ref 0.61–1.24)
GFR, Estimated: 44 mL/min — ABNORMAL LOW (ref >60)
Glucose, Bld: 164 mg/dL — ABNORMAL HIGH (ref 70–99)
Potassium: 4.1 mmol/L (ref 3.5–5.1)
Sodium: 137 mmol/L (ref 135–145)
Total Bilirubin: 0.5 mg/dL (ref 0.0–1.2)
Total Protein: 5.9 g/dL — ABNORMAL LOW (ref 6.5–8.1)

## 2024-01-01 LAB — CBC
HCT: 37 % — ABNORMAL LOW (ref 39.0–52.0)
Hemoglobin: 11.8 g/dL — ABNORMAL LOW (ref 13.0–17.0)
MCH: 31.4 pg (ref 26.0–34.0)
MCHC: 31.9 g/dL (ref 30.0–36.0)
MCV: 98.4 fL (ref 80.0–100.0)
Platelets: 352 10*3/uL (ref 150–400)
RBC: 3.76 MIL/uL — ABNORMAL LOW (ref 4.22–5.81)
RDW: 13.1 % (ref 11.5–15.5)
WBC: 14 10*3/uL — ABNORMAL HIGH (ref 4.0–10.5)
nRBC: 0 % (ref 0.0–0.2)

## 2024-01-01 LAB — BRAIN NATRIURETIC PEPTIDE: B Natriuretic Peptide: 635 pg/mL — ABNORMAL HIGH (ref 0.0–100.0)

## 2024-01-01 MED ORDER — LEVOFLOXACIN 500 MG PO TABS
500.0000 mg | ORAL_TABLET | ORAL | Status: DC
  Administered 2024-01-01: 500 mg via ORAL
  Filled 2024-01-01: qty 1

## 2024-01-01 MED ORDER — AMIODARONE LOAD VIA INFUSION
150.0000 mg | Freq: Once | INTRAVENOUS | Status: AC
  Administered 2024-01-01: 150 mg via INTRAVENOUS
  Filled 2024-01-01: qty 83.34

## 2024-01-01 MED ORDER — ALPRAZOLAM 0.25 MG PO TABS
0.2500 mg | ORAL_TABLET | Freq: Three times a day (TID) | ORAL | Status: DC | PRN
  Administered 2024-01-01 – 2024-01-04 (×7): 0.25 mg via ORAL
  Filled 2024-01-01 (×7): qty 1

## 2024-01-01 MED ORDER — ENSURE ENLIVE PO LIQD
237.0000 mL | Freq: Two times a day (BID) | ORAL | Status: DC
  Administered 2024-01-02 – 2024-01-06 (×9): 237 mL via ORAL

## 2024-01-01 MED ORDER — FUROSEMIDE 10 MG/ML IJ SOLN
40.0000 mg | Freq: Once | INTRAMUSCULAR | Status: AC
  Administered 2024-01-01: 40 mg via INTRAVENOUS
  Filled 2024-01-01: qty 4

## 2024-01-01 MED ORDER — DILTIAZEM HCL 30 MG PO TABS
30.0000 mg | ORAL_TABLET | Freq: Four times a day (QID) | ORAL | Status: DC
  Administered 2024-01-01 – 2024-01-02 (×5): 30 mg via ORAL
  Filled 2024-01-01 (×5): qty 1

## 2024-01-01 MED ORDER — ADULT MULTIVITAMIN W/MINERALS CH
1.0000 | ORAL_TABLET | Freq: Every day | ORAL | Status: DC
  Administered 2024-01-01 – 2024-01-07 (×7): 1 via ORAL
  Filled 2024-01-01 (×6): qty 1

## 2024-01-01 NOTE — Evaluation (Signed)
 Clinical/Bedside Swallow Evaluation Patient Details  Name: Kyle Owen MRN: [id number] Date of Birth: 02/18/1941  Today's Date: 01/01/2024 Time: SLP Start Time (ACUTE ONLY): 1335 SLP Stop Time (ACUTE ONLY): 1347 SLP Time Calculation (min) (ACUTE ONLY): 12 min  Past Medical History:  Past Medical History:  Diagnosis Date   A-fib (HCC)    Anticoagulant long-term use    eliquis --- managed by cardiology   Benign prostatic hypertrophy with urinary frequency    Bladder cancer (HCC)    Chronic respiratory failure with hypoxia (HCC)    Complication of anesthesia    gets combative in recovery   COPD with chronic bronchitis and emphysema (HCC)    pulmonologist--- dr jude;  nocturnal oxygen,  no daytime oxygen use   Dementia (HCC)    Dependence on nocturnal oxygen therapy    Depression    Dyspnea    when walking   History of colon polyps    History of kidney stones    History of loop recorder    loop recorder in place battery is dead has not had changed due to covid   History of TIA (transient ischemic attack)    (per pt no residual ) per neurologist note (dr margaret 11-08-2015) dx cryptogenic TIA versus arrhythia versus dysautonomia (right ophthalmic artery TIA and x3 posterior circulation TIAs   Hyperlipidemia    Hypertension    Pt denies   Malignant neoplasm of overlapping sites of bladder [facility])    urologist--- dr matilda   Multinodular thyroid     per pathology report 11-12-2014 bilateral thyroid   benign multinoduler follicular adenoma and hyperplastic    Nephrolithiasis    OA (osteoarthritis)    DJD right knee   Ocular migraine    controlled w/ verapamil    On home oxygen therapy    PAF (paroxysmal atrial fibrillation) (HCC)    followed by AFIB clinic-- cardiologist-- dr jeffrie   Pneumonia    2023   Pulmonary nodule, left    left lower lobe x2 per CT 01-20-2016   Renal cyst, left    Stroke [facility])    Thoracic aortic aneurysm without rupture [facility])    followed by dr fleeta  tright ;   ascending -- ct chest 01-24-2023   4.8cm   Wears hearing aid in both ears    wear at times   Past Surgical History:  Past Surgical History:  Procedure Laterality Date   ANTERIOR CERVICAL DECOMP/DISCECTOMY FUSION N/A 04/03/2017   Procedure: Cervical three-four, Cervical four-five Anterior cervical decompression/discectomy/fusion;  Surgeon: Fairy Levels, MD;  [facility];  Service: Neurosurgery;  Laterality: N/A;   CATARACT EXTRACTION W/ INTRAOCULAR LENS IMPLANT Right 2016   CATARACT EXTRACTION W/PHACO  12/16/2012   Procedure: CATARACT EXTRACTION PHACO AND INTRAOCULAR LENS PLACEMENT (IOC);  Surgeon: Cherene Mania, MD;  Location: [facility] ORS;  Service: Ophthalmology;  Laterality: Left;  CDE:17.31   COLONOSCOPY  10/03/2011   Procedure: COLONOSCOPY;  Surgeon: Oneil DELENA Budge;  Location: [facility] ENDO SUITE;  Service: Gastroenterology;  Laterality: N/A;   CYSTOSCOPY W/ RETROGRADES Bilateral 04/12/2023   Procedure: CYSTOSCOPY WITH RETROGRADE PYELOGRAM;  Surgeon: Matilda Senior, MD;  Location: [facility] ORS;  Service: Urology;  Laterality: Bilateral;   CYSTOSCOPY W/ RETROGRADES Bilateral 11/12/2023   Procedure: CYSTOSCOPY WITH RETROGRADE PYELOGRAM;  Surgeon: Matilda Senior, MD;  Location: [facility] ORS;  Service: Urology;  Laterality: Bilateral;   CYSTOSCOPY WITH BIOPSY N/A 04/12/2023   Procedure: CYSTOSCOPY WITH BLADDER BIOPSY;  Surgeon: Matilda Senior, MD;  Location: [facility] ORS;  Service: Urology;  Laterality: N/A;  45 MINS   CYSTOSCOPY WITH BIOPSY N/A 11/12/2023   Procedure: CYSTOSCOPY WITH BLADDER  BIOPSY AND PROSTATIC URETHRA;  Surgeon: Matilda Senior, MD;  Location: [facility]S;  Service: Urology;  Laterality: N/A;   CYSTOSCOPY/RETROGRADE/URETEROSCOPY/STONE EXTRACTION WITH BASKET Right 03/29/2020   Procedure: CYSTOSCOPY/RETROGRADE/URETEROSCOPY/STONE EXTRACTION WITH BASKET;  Surgeon: Matilda Senior, MD;  Location: [facility]S;  Service: Urology;  Laterality: Right;  1 HR   DECOMPRESSIOIN ULNAR NERVE AND  CUBITAL TUNNEL RELEASE Left 07/14/2009   elbow   EP IMPLANTABLE DEVICE N/A 08/10/2015   MDT ILR implanted by Dr Kelsie for cryptogenic stroke   HOLMIUM LASER APPLICATION Right 03/29/2020   Procedure: HOLMIUM LASER APPLICATION;  Surgeon: Matilda Senior, MD;  Location: [facility];  Service: Urology;  Laterality: Right;   INGUINAL HERNIA REPAIR Right 1990's   KNEE ARTHROSCOPY Right 2015   POSTERIOR LUMBAR FUSION     2014   L3--4;    12/ 2018   L4--5   SHOULDER ARTHROSCOPY Left 1990's   TEE WITHOUT CARDIOVERSION N/A 07/27/2015   Procedure: TRANSESOPHAGEAL ECHOCARDIOGRAM (TEE);  Surgeon: Redell GORMAN Shallow, MD;  Location: [facility] ENDOSCOPY;  Service: Cardiovascular;  Laterality: N/A;   normal LV function, ef 55-60%,  mild AR and MR, mild dilated ascending aorta (4.2cm),  mild to moderate atherosclerosis  descending aorta,  mild to moderate TR,  negative saline microcavitation study   THYROIDECTOMY Right 01/20/2015   Procedure: RIGHT THYROIDECTOMY;  Surgeon: Ana LELON Moccasin, MD;  Location: [facility] OR;  Service: ENT;  Laterality: Right;   TONSILLECTOMY  as child   TOTAL HIP ARTHROPLASTY Left 12/14/2020   Procedure: TOTAL HIP ARTHROPLASTY ANTERIOR APPROACH;  Surgeon: Beverley Evalene BIRCH, MD;  Location: [facility];  Service: Orthopedics;  Laterality: Left;   TRANSURETHRAL RESECTION OF BLADDER TUMOR N/A 05/15/2016   Procedure: TRANSURETHRAL RESECTION OF BLADDER TUMOR (TURBT) AND INSTILLATION OF EPIRUBICIN ;  Surgeon: Senior Matilda, MD;  Location: [facility];  Service: Urology;  Laterality: N/A;   TRANSURETHRAL RESECTION OF BLADDER TUMOR N/A 03/29/2020   Procedure: TRANSURETHRAL RESECTION OF BLADDER TUMOR (TURBT);  Surgeon: Matilda Senior, MD;  Location: [facility];  Service: Urology;  Laterality: N/A;   TRANSURETHRAL RESECTION OF BLADDER TUMOR WITH MITOMYCIN -C N/A 01/23/2022   Procedure: TRANSURETHRAL RESECTION OF BLADDER TUMOR WITH POST OPERATIVE GEMCITABINE  INSTILLATION;  Surgeon: Matilda Senior, MD;   Location: [facility];  Service: Urology;  Laterality: N/A;   URETEROSCOPY Left 11/12/2023   Procedure: URETEROSCOPY;  Surgeon: Matilda Senior, MD;  Location: [facility];  Service: Urology;  Laterality: Left;   HPI:  Kyle Owen is a-year-old male with extensive history of advanced dementia, chronic A-fib on Eliquis , BPH, prostate cancer, COPD to dependent 4 L, depression, TIAs, HLD, HTN, OA, DJD, old left pulmonary nodule, chronic thoracic aneurysm.... Presenting today with progressive shortness of breath, hypoxia on 2-4 L satting 88%... Improved to 90% on 6 L, also complaining of palpitation and dizziness.    Assessment / Plan / Recommendation  Clinical Impression  Clinical swallowing evaluation completed while Pt was sitting upright in bed; Pt's wife was feeding Pt upon SLP entering the room. Pt consumed regular textures and thin liquids without overt s/sx of oropharyngeal dysphagia. No coughing observed. RN reports consistent coughing after meals and occasional coughing during PO. RN reports wife occasionally attempting to feed Pt or provide liquid while Pt is reclined; SLP reinforced the importance of Pt being seated upright for all PO. Recommend continue with regular diet and thin liquids; meds are ok whole  with liquids. Despite no overt s/sx observed d/t RN report ST will f/u X1 to ensure diet tolerance. Thank you for this referral, SLP Visit Diagnosis: Dysphagia, unspecified (R13.10)    Aspiration Risk       Diet Recommendation Regular;Thin liquid    Liquid Administration via: Cup;Straw Medication Administration: Whole meds with liquid Supervision: Patient able to self feed Compensations: Minimize environmental distractions;Slow rate;Small sips/bites Postural Changes: Seated upright at 90 degrees    Other  Recommendations Oral Care Recommendations: Oral care BID Caregiver Recommendations: Other (Comment) (Always sit Pt upright for PO)    Recommendations for follow up therapy are one  component of a multi-disciplinary discharge planning process, led by the attending physician.  Recommendations may be updated based on patient status, additional functional criteria and insurance authorization.  Follow up Recommendations   F/u X1 to ensure diet tolerance           Frequency and Duration min 1 x/week  1 week       Prognosis Prognosis for improved oropharyngeal function: Good      Swallow Study   General Date of Onset: 12/29/23 HPI: Kyle Owen is a-year-old male with extensive history of advanced dementia, chronic A-fib on Eliquis , BPH, prostate cancer, COPD to dependent 4 L, depression, TIAs, HLD, HTN, OA, DJD, old left pulmonary nodule, chronic thoracic aneurysm.... Presenting today with progressive shortness of breath, hypoxia on 2-4 L satting 88%... Improved to 90% on 6 L, also complaining of palpitation and dizziness. Type of Study: Bedside Swallow Evaluation Previous Swallow Assessment: none in chart Diet Prior to this Study: Regular;Thin liquids (Level 0) Temperature Spikes Noted: No Respiratory Status: Room air History of Recent Intubation: No Behavior/Cognition: Alert;Cooperative;Pleasant mood;Confused Oral Cavity Assessment: Within Functional Limits Oral Care Completed by SLP: Recent completion by staff Oral Cavity - Dentition: Adequate natural dentition Vision: Functional for self-feeding Self-Feeding Abilities: Able to feed self Patient Positioning: Upright in bed Baseline Vocal Quality: Normal Volitional Cough: Strong Volitional Swallow: Able to elicit    Oral/Motor/Sensory Function Overall Oral Motor/Sensory Function: Within functional limits   Ice Chips Ice chips: Within functional limits   Thin Liquid Thin Liquid: Within functional limits    Nectar Thick Nectar Thick Liquid: Not tested   Honey Thick Honey Thick Liquid: Not tested   Puree Puree: Within functional limits   Solid     Solid: Within functional limits     Kyle Sarwar H.  Clois Owen, CCC-SLP Speech Language Pathologist  Raguel VEAR Clois 01/01/2024,2:06 PM

## 2024-01-01 NOTE — Progress Notes (Signed)
 Daily Progress Note   Patient Name: Kyle Owen       Date: 01/01/2024 DOB: [birth year]  Age: 83 y.o. MRN#: [id number] Attending Physician: Willette Adriana DELENA, MD Primary Care Physician: Sheryle Carwin, MD Admit Date: 12/29/2023 Length of Stay: 3 days  Reason for Consultation/Follow-up: Establishing goals of care  HPI/Patient Profile:  83 y.o. 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 He presented with progressive shortness of breath, hypoxia on 2-4 L satting 88% improving to 90% on 6 L, as well as palpitation and dizziness. He was admitted on 12/29/2023 with sepsis due to multifocal pneumonia, acute on chronic respiratory failure with hypoxia, paroxysmal A-fib with RVR, advanced dementia, CKD 3b, and others.    Palliative medicine was consulted for GOC conversations.  Subjective:   Subjective: Chart Reviewed. Updates received. Patient Assessed. Created space and opportunity for patient  and family to explore thoughts and feelings regarding current medical situation.  Today's Discussion: Today saw the patient at the bedside, his wife Kyle Owen was present.  She was sitting at the bedside.  I explained that I had met the patient's daughter Kyle Owen yesterday and the conversations we had.  The patient's wife is a bit taken aback that conversations were had with the daughter and not with her.  I explained that daughter is the [facility] listed and that she was here and available for the visit.  I shared that it is our goal to involve all family and conversations, but we cannot be precluded from speaking with his legally designated HCPOA.  She shares that this was done before their marriage but I again expressed that it is a legal document designating that final decisions are per his daughter Kyle Owen.  However, I encouraged her to reach out to the  patient's daughter and see if there is a day/time that we could get together to talk about how to care for the patient together and come to agreements on how to proceed.  I offered her contact card with phone number and encouraged her to call me with a date and time in the next few days between 9 AM and 3 PM and I be happy to meet with everybody.  The patient today is pleasantly confused.  He denies pain, nausea, vomiting.  He is again talking about travel and flying, seems to still think he is on an airplane.  I told him I would be back tomorrow to check on him as well.  I provided emotional and general support through therapeutic listening, empathy, sharing of stories, and other techniques. I answered all questions and addressed all concerns to the best of my ability.  **Later in the day that the patient's daughter left a message with our service at they are available to meet tomorrow at 2 PM**  ROS Limited due to dementia Review of Systems  Constitutional:        Denies pain in general  Cardiovascular:  Negative for chest pain.  Gastrointestinal:  Negative for abdominal pain and nausea.    Objective:   Vital Signs:  BP (!) 148/99   Pulse (!) 126   Temp 98.2 F (36.8 C) (Oral)   Resp 20   Ht 6' (1.829 m)   Wt 89.6 kg   SpO2 100%   BMI 26.79 kg/m   Physical Exam Vitals and nursing note reviewed.  Constitutional:      General: He is not in acute distress.    Appearance: He is ill-appearing.  HENT:     Head: Normocephalic and atraumatic.  Cardiovascular:     Rate and Rhythm: Tachycardia present. Rhythm irregular.  Pulmonary:     Effort: Pulmonary effort is normal. No respiratory distress.     Breath sounds: No wheezing or rhonchi.  Abdominal:     General: Abdomen is flat. Bowel sounds are normal. There is no distension.     Palpations: Abdomen is soft.  Skin:    General: Skin is warm and dry.  Neurological:     Mental Status: He is alert. He is disoriented and confused.   Psychiatric:     Comments: Consistently attempting to get out of bed, pulling at blankets and lines     Palliative Assessment/Data: 30-40%    Existing Vynca/ACP Documentation: Advance directive signed 09/14/2020   Assessment & Plan:   Impression: Present on Admission:  Sepsis (HCC)  Chronic kidney disease, stage 3b (HCC)  Chronic respiratory failure with hypoxia (HCC)  Thoracic ascending aortic aneurysm (HCC)  COPD with chronic bronchitis and emphysema (HCC)  Anxiety  Paroxysmal atrial fibrillation (HCC)  Advanced dementia (HCC)  Hyperlipidemia  Acute on chronic respiratory failure with hypoxia (HCC)  83 year old male with acute presentation of chronic comorbidities as described above. His underlying issue is multiple chronic comorbidities including advanced dementia that seems to be rapidly progressing as of late. This is likely due to a couple of recent hospitalizations. His acute picture including sepsis due to pneumonia seems to be improving. I feel he will discharge from the hospital from his acute presentation, although his dementia will continue to progress. Daughter/HCPOA seems to understand the situation quite well. They are agreeable to outpatient palliative care to follow along, or looking at other care options as his dementia progresses including in-home care versus memory care placement. They appear to have good family support from daughters. They do understand that at some point in the near future hospice discussions would be beneficial. Overall long-term prognosis poor.   SUMMARY OF RECOMMENDATIONS   DNR-limited Continue to treat the treatable Palliative medicine will follow-up for family meeting tomorrow at 2:00 PM  Symptom Management:  Per primary team PMT is available to assist as needed  Code Status: DNR-limited  Prognosis: Unable to determine  Discharge Planning: To Be Determined  Discussed with: [name] wife, medical team, nursing team  Thank you  for allowing us  to participate in the care of Kyle Owen PMT will continue to support holistically.  Time Total: 30 min  Detailed review of medical records (labs, imaging, vital signs), medically appropriate exam, discussed with treatment team, counseling and education to patient, family, & staff, documenting clinical information, medication management, coordination of care  Camellia Kays, NP Palliative Medicine Team  Team Phone # [phone] (Nights/Weekends)  08/23/2021, 8:17 AM

## 2024-01-01 NOTE — Progress Notes (Signed)
 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 Presenting today with progressive shortness of breath, hypoxia on 2-4 L satting 88%... Improved to 90% on 6 L, also complaining of palpitation and dizziness.   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    Assessment & Plan:   Principal Problem:   Sepsis (HCC) Active Problems:   Paroxysmal atrial fibrillation (HCC)   Acute on chronic respiratory failure with hypoxia [facility])   Thoracic ascending aortic aneurysm (HCC)   Advanced dementia (HCC)   Chronic kidney disease, stage 3b (HCC)   COPD with chronic bronchitis and emphysema (HCC)   Chronic respiratory failure with hypoxia (HCC)    Anxiety   Hyperlipidemia     Assessment and Plan: * Sepsis (HCC)-due to multifocal pneumonia POA: Met sepsis on admission -Improved sepsis physiology still in A-fib with RVR, BP stable, afebrile  Creatinine 1.32, lactic acid 1.1, Influenza A/B, SARS-CoV-2 negative  -Source of infection multifocal pneumonia  -Blood cultures >>> no growth to date -Was not able to obtain sputum cultures -Initial IV antibiotics Rocephin /Doxy>>> switch to p.o. Levaquin  on 1/7  CT angiogram multiple artifact negative for any obvious pulmonary embolism, multilobar pneumonia , 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 Chest x-ray: Diffuse superficial prominence in the lower lobe could reflect edema or infection    Continue to monitor in ICU setting for A-fib with RVR As patient remains on amiodarone  drip    Acute on chronic respiratory failure with hypoxia (HCC) -Oxygen demand as high as 6 L >>> tapering down currently on 2 L satting 98% Due to multifocal pneumonia and COPD exacerbation -Continue with current antibiotics-switching to p.o. antibiotics -Bronchodilators as needed  -IV steroids>>> tapering down>>> discontinued steroids 01/01/2024   Paroxysmal atrial fibrillation (HCC) Currently in A-fib with RVR Now persistent atrial fibrillation, chronically anticoagulated with Eliquis  Home medication bipolar DC'd by cardiology -Limited use of IV Cardizem  due to hypotension--BP has stabilized -Currently on Amiodarone  gtt.  -  Cardiology Dr. Alvan following,  Advanced dementia [facility]) With behavior disturbances currently not on any chronic medication with exception of sertraline  Will utilize as needed Haldol  versus Ativan  Thoracic ascending aortic aneurysm (HCC) -Chronic history of thoracic ascending aortic aneurysm -Based on today's CTA 4.7 cm no changes -Follow-up as an outpatient and monitor closely  Chronic kidney disease, stage 3b (HCC) Monitoring BUN/creatinine closely currently at  baseline Waiting nephrotoxins-will adjust medication renally Lab Results  Component Value Date   CREATININE 1.55 (H) 01/01/2024   CREATININE 1.50 (H) 12/31/2023   CREATININE 1.32 (H) 12/30/2023     Hyperlipidemia Will continue statins  Anxiety As needed Haldol /Ativan  Chronic respiratory failure with hypoxia (HCC)/COPD History of chronic respiratory failure with COPD, with baseline O2 demand of 2 L Management as above     ----------------------------------------------------------------------------------------------------------------------------------------------- Nutritional status:  The patient's BMI is: Body mass index is 26.79 kg/m. I agree with the assessment and plan as outlined   --------------------------------------------------------------------------------------------------------------------------------------------- Cultures; Blood Cultures x 2 >> no growth to date    ------------------------------------------------------------------------------------------------------------------------------------------------  DVT prophylaxis:  SCDs Start: 12/29/23 0750 apixaban  (ELIQUIS ) tablet 5 mg   Code Status:   Code Status: Limited: Do not attempt resuscitation (DNR) -DNR-LIMITED -Do Not Intubate/DNI   Family Communication: Wife present at bedside-updated -Advance care planning has been discussed.   Admission status:   Status is: Inpatient Remains inpatient appropriate because: Needing IV antibiotics   Disposition: From  - home             Planning for discharge in 1-2 days: to home with home health  Procedures:   No admission procedures for hospital encounter.   Antimicrobials:  Anti-infectives (From admission, onward)    Start     Dose/Rate Route Frequency Ordered Stop   01/01/24 1730  levofloxacin  (LEVAQUIN ) tablet 500 mg        500 mg Oral Daily 01/01/24 1639     01/01/24 1000  doxycycline  (VIBRAMYCIN ) 100 mg in dextrose  5 % 250 mL IVPB  Status:   Discontinued        100 mg 125 mL/hr over 120 Minutes Intravenous Every 12 hours 12/31/23 1111 01/01/24 1639   12/30/23 0300  cefTRIAXone  (ROCEPHIN ) 2 g in sodium chloride  0.9 % 100 mL IVPB  Status:  Discontinued        2 g 200 mL/hr over 30 Minutes Intravenous Every 24 hours 12/29/23 0755 01/01/24 1639   12/29/23 0900  azithromycin  (ZITHROMAX ) 500 mg in sodium chloride  0.9 % 250 mL IVPB  Status:  Discontinued        500 mg 250 mL/hr over 60 Minutes Intravenous Every 24 hours 12/29/23 0755 12/31/23 1111   12/29/23 0330  cefTRIAXone  (ROCEPHIN ) 2 g in sodium chloride  0.9 % 100 mL IVPB        2 g 200 mL/hr over 30 Minutes Intravenous  Once 12/29/23 0322 12/29/23 0410   12/29/23 0330  azithromycin  (ZITHROMAX ) 500 mg in sodium chloride  0.9 % 250 mL IVPB  Status:  Discontinued        500 mg 250 mL/hr over 60 Minutes Intravenous Every 24 hours 12/29/23 0322 12/29/23 0755        Medication:   apixaban   5 mg Oral BID   atorvastatin   20 mg Oral Daily   Chlorhexidine  Gluconate Cloth  6 each Topical Daily   diltiazem   30 mg Oral QID   feeding supplement  237 mL Oral BID BM   finasteride   5 mg Oral q AM  fluticasone  furoate-vilanterol  1 puff Inhalation Daily   levofloxacin   500 mg Oral Daily   melatonin  6 mg Oral QHS   multivitamin with minerals  1 tablet Oral Daily   sertraline   50 mg Oral q morning   sodium chloride  flush  3 mL Intravenous Q12H   sodium chloride  flush  3 mL Intravenous Q12H   traZODone   50 mg Oral QHS    acetaminophen  **OR** acetaminophen , ALPRAZolam , bisacodyl , haloperidol  lactate, hydrALAZINE , HYDROmorphone  (DILAUDID ) injection, ipratropium, levalbuterol , ondansetron  **OR** ondansetron  (ZOFRAN ) IV, mouth rinse, oxyCODONE , senna-docusate, sodium phosphate    Objective:   Vitals:   01/01/24 1500 01/01/24 1530 01/01/24 1600 01/01/24 1630  BP: (!) 155/74 (!) 145/97 (!) 121/91 (!) 137/94  Pulse:      Resp: (!) 27 (!) 32 19 19  Temp:      TempSrc:      SpO2:  98%     Weight:      Height:        Intake/Output Summary (Last 24 hours) at 01/01/2024 1640 Last data filed at 01/01/2024 1512 Gross per 24 hour  Intake 1127.12 ml  Output 2950 ml  Net -1822.88 ml   Filed Weights   12/30/23 0500 12/31/23 0400 01/01/24 0500  Weight: 86.4 kg 89 kg 89.6 kg     Physical examination:     General:  AAO x 1, pleasantly confused  HEENT:  Normocephalic, PERRL, otherwise with in Normal limits   Neuro:  CNII-XII intact. , normal motor and sensation, reflexes intact   Lungs:   Clear to auscultation BL, Respirations unlabored,  Improved wheezes and rales,   Cardio:    S1/S2, RRR, No murmure, No Rubs or Gallops   Abdomen:  Soft, non-tender, bowel sounds active all four quadrants, no guarding or peritoneal signs.  Muscular  skeletal:  Limited exam -global generalized weaknesses - in bed, able to move all 4 extremities,   2+ pulses,  symmetric, No pitting edema  Skin:  Dry, warm to touch, negative for any Rashes,  Wounds: Please see nursing documentation   ---------------------------------------------------------------------------------------------------------------------------    LABs:     Latest Ref Rng & Units 01/01/2024    5:07 AM 12/31/2023    4:35 AM 12/30/2023    5:54 AM  CBC  WBC 4.0 - 10.5 K/uL 14.0  13.8  8.0   Hemoglobin 13.0 - 17.0 g/dL 88.1  87.7  88.9   Hematocrit 39.0 - 52.0 % 37.0  39.0  35.6   Platelets 150 - 400 K/uL 352  334  258       Latest Ref Rng & Units 01/01/2024    5:07 AM 12/31/2023    4:35 AM 12/30/2023    5:54 AM  CMP  Glucose 70 - 99 mg/dL 835  817  825   BUN 8 - 23 mg/dL 45  40  31   Creatinine 0.61 - 1.24 mg/dL 8.44  8.49  8.67   Sodium 135 - 145 mmol/L 137  141  140   Potassium 3.5 - 5.1 mmol/L 4.1  4.1  4.1   Chloride 98 - 111 mmol/L 103  104  104   CO2 22 - 32 mmol/L 29  24  27    Calcium  8.9 - 10.3 mg/dL 8.4  9.1  8.8   Total Protein 6.5 - 8.1 g/dL 5.9  6.7  6.1   Total Bilirubin 0.0 - 1.2 mg/dL 0.5  0.5  0.8    Alkaline Phos 38 - 126 U/L 64  70  60   AST 15 - 41 U/L 39  39  31   ALT 0 - 44 U/L 30  26  21         Micro Results Recent Results (from the past 240 hours)  Culture, blood (single)     Status: None (Preliminary result)   Collection Time: 12/29/23  7:48 AM   Specimen: Left Antecubital; Blood  Result Value Ref Range Status   Specimen Description   Final    LEFT ANTECUBITAL BOTTLES DRAWN AEROBIC AND ANAEROBIC   Special Requests   Final    Blood Culture results may not be optimal due to an inadequate volume of blood received in culture bottles   Culture   Final    NO GROWTH 3 DAYS Performed at [facility], [address] [place], [place] [address]    Report Status PENDING  Incomplete  Culture, blood (x 2)     Status: None (Preliminary result)   Collection Time: 12/29/23  8:10 AM   Specimen: BLOOD LEFT HAND  Result Value Ref Range Status   Specimen Description   Final    BLOOD LEFT HAND BOTTLES DRAWN AEROBIC AND ANAEROBIC   Special Requests Blood Culture adequate volume  Final   Culture   Final    NO GROWTH 3 DAYS Performed at [facility], [address] [place], [place] [address]    Report Status PENDING  Incomplete  Resp panel by RT-PCR (RSV, Flu A&B, Covid) Anterior Nasal Swab     Status: None   Collection Time: 12/29/23  8:22 AM   Specimen: Anterior Nasal Swab  Result Value Ref Range Status   SARS Coronavirus 2 by RT PCR NEGATIVE NEGATIVE Final    Comment: (NOTE) SARS-CoV-2 target nucleic acids are NOT DETECTED.  The SARS-CoV-2 RNA is generally detectable in upper respiratory specimens during the acute phase of infection. The lowest concentration of SARS-CoV-2 viral copies this assay can detect is 138 copies/mL. A negative result does not preclude SARS-Cov-2 infection and should not be used as the sole basis for treatment or other patient management decisions. A negative result may occur with  improper specimen collection/handling, submission of specimen  other than nasopharyngeal swab, presence of viral mutation(s) within the areas targeted by this assay, and inadequate number of viral copies(<138 copies/mL). A negative result must be combined with clinical observations, patient history, and epidemiological information. The expected result is Negative.  Fact Sheet for Patients:  bloggercourse.com  Fact Sheet for Healthcare Providers:  seriousbroker.it  This test is no t yet approved or cleared by the [place]  FDA and  has been authorized for detection and/or diagnosis of SARS-CoV-2 by FDA under an Emergency Use Authorization (EUA). This EUA will remain  in effect (meaning this test can be used) for the duration of the COVID-19 declaration under Section 564(b)(1) of the Act, 21 U.S.C.section 360bbb-3(b)(1), unless the authorization is terminated  or revoked sooner.       Influenza A by PCR NEGATIVE NEGATIVE Final   Influenza B by PCR NEGATIVE NEGATIVE Final    Comment: (NOTE) The Xpert Xpress SARS-CoV-2/FLU/RSV plus assay is intended as an aid in the diagnosis of influenza from Nasopharyngeal swab specimens and should not be used as a sole basis for treatment. Nasal washings and aspirates are unacceptable for Xpert Xpress SARS-CoV-2/FLU/RSV testing.  Fact Sheet for Patients: bloggercourse.com  Fact Sheet for Healthcare Providers: seriousbroker.it  This test is not yet approved or cleared by the [place]  FDA and has been  authorized for detection and/or diagnosis of SARS-CoV-2 by FDA under an Emergency Use Authorization (EUA). This EUA will remain in effect (meaning this test can be used) for the duration of the COVID-19 declaration under Section 564(b)(1) of the Act, 21 U.S.C. section 360bbb-3(b)(1), unless the authorization is terminated or revoked.     Resp Syncytial Virus by PCR NEGATIVE NEGATIVE Final     Comment: (NOTE) Fact Sheet for Patients: bloggercourse.com  Fact Sheet for Healthcare Providers: seriousbroker.it  This test is not yet approved or cleared by the [place]  FDA and has been authorized for detection and/or diagnosis of SARS-CoV-2 by FDA under an Emergency Use Authorization (EUA). This EUA will remain in effect (meaning this test can be used) for the duration of the COVID-19 declaration under Section 564(b)(1) of the Act, 21 U.S.C. section 360bbb-3(b)(1), unless the authorization is terminated or revoked.  Performed at [facility], [address], [place], [place] [address]   MRSA Next Gen by PCR, Nasal     Status: None   Collection Time: 12/29/23  1:13 PM   Specimen: Nasal Mucosa; Nasal Swab  Result Value Ref Range Status   MRSA by PCR Next Gen NOT DETECTED NOT DETECTED Final    Comment: (NOTE) The GeneXpert MRSA Assay (FDA approved for NASAL specimens only), is one component of a comprehensive MRSA colonization surveillance program. It is not intended to diagnose MRSA infection nor to guide or monitor treatment for MRSA infections. Test performance is not FDA approved in patients less than 52 years old. Performed at [facility], [address] [place], [place] [address]     Radiology Reports No results found.  SIGNED: Adriana DELENA Grams, MD, FHM. FAAFP. Jol[facility] - [facility] Critical care time spent - 55 min.  In seeing, evaluating and examining the patient. Reviewing medical records, labs, drawn plan of care. [facility],  Pager (please use [email or web address] to page/ text) Please use Epic Secure Chat for non-urgent communication (7AM-7PM)  If 7PM-7AM, please contact night-coverage [email or web address], 01/01/2024, 4:40 PM

## 2024-01-01 NOTE — Progress Notes (Addendum)
 Rounding Note    Patient Name: Kyle Owen Date of Encounter: 01/01/2024  [facility] Cardiologist: Oneil Parchment, MD   Subjective   Some ongoing cough, SOB  Inpatient Medications    Scheduled Meds:  apixaban   5 mg Oral BID   atorvastatin   20 mg Oral Daily   Chlorhexidine  Gluconate Cloth  6 each Topical Daily   feeding supplement  237 mL Oral BID BM   finasteride   5 mg Oral q AM   fluticasone  furoate-vilanterol  1 puff Inhalation Daily   melatonin  6 mg Oral QHS   methylPREDNISolone  (SOLU-MEDROL ) injection  40 mg Intravenous Q24H   sertraline   50 mg Oral q morning   sodium chloride  flush  3 mL Intravenous Q12H   sodium chloride  flush  3 mL Intravenous Q12H   traZODone   50 mg Oral QHS   Continuous Infusions:  amiodarone  30 mg/hr (01/01/24 0547)   cefTRIAXone  (ROCEPHIN )  IV Stopped (01/01/24 0235)   doxycycline  (VIBRAMYCIN ) IV     PRN Meds: acetaminophen  **OR** acetaminophen , bisacodyl , haloperidol  lactate, hydrALAZINE , HYDROmorphone  (DILAUDID ) injection, ipratropium, levalbuterol , ondansetron  **OR** ondansetron  (ZOFRAN ) IV, mouth rinse, oxyCODONE , senna-docusate, sodium phosphate    Vital Signs    Vitals:   01/01/24 0500 01/01/24 0506 01/01/24 0630 01/01/24 0732  BP: (!) 171/112 (!) 150/95 (!) 148/96   Pulse: (!) 113 (!) 113 (!) 106   Resp: 17 (!) 24 (!) 21   Temp:      TempSrc:      SpO2: 95% 100% 100% 100%  Weight: 89.6 kg     Height:        Intake/Output Summary (Last 24 hours) at 01/01/2024 0750 Last data filed at 01/01/2024 0418 Gross per 24 hour  Intake 391.43 ml  Output 600 ml  Net -208.57 ml      01/01/2024    5:00 AM 12/31/2023    4:00 AM 12/30/2023    5:00 AM  Last 3 Weights  Weight (lbs) 197 lb 8.5 oz 196 lb 3.4 oz 190 lb 7.6 oz  Weight (kg) 89.6 kg 89 kg 86.4 kg      Telemetry    Irreg, tach - Personally Reviewed  ECG    N/a - Personally Reviewed  Physical Exam   GEN: No acute distress.   Neck: + JVD Cardiac:  irreg Respiratory: coarse bilaterally GI: Soft, nontender, non-distended  MS: No edema; No deformity. Neuro:  Nonfocal  Psych: Normal affect   Labs    High Sensitivity Troponin:  No results for input(s): TROPONINIHS in the last 720 hours.   Chemistry Recent Labs  Lab 12/29/23 0215 12/30/23 0554 12/31/23 0435 01/01/24 0507  NA 137 140 141 137  K 3.7 4.1 4.1 4.1  CL 102 104 104 103  CO2 26 27 24 29   GLUCOSE 156* 174* 182* 164*  BUN 32* 31* 40* 45*  CREATININE 1.66* 1.32* 1.50* 1.55*  CALCIUM  8.9 8.8* 9.1 8.4*  MG 2.3  --   --   --   PROT 6.8 6.1* 6.7 5.9*  ALBUMIN 3.0* 2.7* 3.0* 2.5*  AST 31 31 39 39  ALT 20 21 26 30   ALKPHOS 68 60 70 64  BILITOT 1.2 0.8 0.5 0.5  GFRNONAA 41* 54* 46* 44*  ANIONGAP 9 9 13 5     Lipids No results for input(s): CHOL, TRIG, HDL, LABVLDL, LDLCALC, CHOLHDL in the last 168 hours.  Hematology Recent Labs  Lab 12/30/23 0554 12/31/23 0435 01/01/24 0507  WBC 8.0 13.8* 14.0*  RBC 3.60* 3.96* 3.76*  HGB 11.0* 12.2* 11.8*  HCT 35.6* 39.0 37.0*  MCV 98.9 98.5 98.4  MCH 30.6 30.8 31.4  MCHC 30.9 31.3 31.9  RDW 12.9 12.9 13.1  PLT 258 334 352   Thyroid   Recent Labs  Lab 12/29/23 0215  TSH 0.835    BNP Recent Labs  Lab 12/29/23 0215 01/01/24 0507  BNP 585.0* 635.0*    DDimer No results for input(s): DDIMER in the last 168 hours.   Radiology    No results found.  Cardiac Studies    Patient Profile     Kyle Owen is a 83 y.o. male with a hx of history of PAF, ascending aortic aneurysm, HLD, bladder CA, COPD on 4L [place] at home, prior CVA, dementia who is being seen 12/31/2023 for the evaluation of tachycardia at the request of Dr Willette.   Assessment & Plan    1.PAF - EKG 08/2022 SR, 09/2023 afib and this admit afib. Probably persistent afib - presented with afib with RVR in setting of sepsis - was started on dilt gtt for rate control - he is on eliquis  for stroke prevention   - high heart rates and low  bp's on dilt gtt at 15. We discontinued dilt gtt, started IV amio 12/30/22 -  overall short course of amio given lung disease. When sepsis resolves afib drive should also decrease. Would stop amio at outpatient f/u   -heart rates remain elevated. Rebolus amio 150mg  then resume at rate of 30mg /hr, add diltiazem  30mg  every 6 hours with hold parameters   2. Sepsis/multifocal pneumonia - per primary team     3. COPD  - on home O2 4L, exacerbation in setting of pneumonia  4. Advanced dementia  5. HFpEF - some signs of fluid overload, uptrending BNP - dose IV lasix  40mg  x 1.  - update echo when rate controlled  For questions or updates, please contact [facility] Please consult [email or web address] for contact info under        Signed, Alvan Carrier, MD  01/01/2024, 7:50 AM

## 2024-01-01 NOTE — TOC Progression Note (Signed)
 Transition of Care [facility]) - Progression Note    Patient Details  Name: Kyle Owen MRN: [id number] Date of Birth: Jan 11, 1941  [facility] CM/SW Contact  Sharlyne Stabs, RN Phone Number: 01/01/2024, 2:48 PM  Clinical Narrative:   Palliative consulted TOC for out patient Palliative. Referral made to [facility] with [facility]. She will follow up for out patient palliative services.    Expected Discharge Plan: Home w Home Health Services Barriers to Discharge: Continued Medical Work up  Expected Discharge Plan and Services In-house Referral: Clinical Social Work    Living arrangements for the past 2 months: Single Family Home                    Social Determinants of Health (SDOH) Interventions SDOH Screenings   Food Insecurity: No Food Insecurity (12/29/2023)  Housing: Unknown (12/29/2023)  Transportation Needs: No Transportation Needs (12/29/2023)  Utilities: Not At Risk (12/29/2023)  Social Connections: Socially Integrated (12/29/2023)  Tobacco Use: Medium Risk (12/29/2023)    Readmission Risk Interventions    12/30/2023   12:30 PM  Readmission Risk Prevention Plan  Transportation Screening Complete  Medication Review (RN Care Manager) Complete  HRI or Home Care Consult Complete  SW Recovery Care/Counseling Consult Complete  Palliative Care Screening Not Applicable  Skilled Nursing Facility Not Applicable

## 2024-01-01 NOTE — Progress Notes (Signed)
 Initial Nutrition Assessment  DOCUMENTATION CODES:   Non-severe (moderate) malnutrition in context of chronic illness  INTERVENTION:   Strawberry Ensure Enlive po BID, each supplement provides 350 kcal and 20 grams of protein. MVI with minerals daily.  NUTRITION DIAGNOSIS:   Moderate Malnutrition related to chronic illness (COPD) as evidenced by mild fat depletion, mild muscle depletion.  GOAL:   Patient will meet greater than or equal to 90% of their needs  MONITOR:   PO intake, Supplement acceptance  REASON FOR ASSESSMENT:   Malnutrition Screening Tool    ASSESSMENT:   83 yo male admitted with sepsis, multifocal PNA. PMH includes TIA, malignant neoplasm of bladder, PAD, renal cyst, DJD R knee, COPD, stroke, advanced dementia.  [name] daughter at bedside reports that patient was eating poorly for a few days after admission, but appetite and intake have improved. He ate a bacon sandwich (1 slice bread + 2 slices bacon + mayo) for breakfast today, which is a usual breakfast for him. He agreed to drink strawberry Ensure between meals to maximize intake of protein and calories.  Labs reviewed.   Medications reviewed and include solumedrol.  Weight history reviewed. He has had gradual weight loss over the past few years, but no significant weight changes.   Patient meets criteria for moderate malnutrition, given mild depletion of muscle and subcutaneous fat mass.  NUTRITION - FOCUSED PHYSICAL EXAM:  Flowsheet Row Most Recent Value  Orbital Region Mild depletion  Upper Arm Region Mild depletion  Thoracic and Lumbar Region No depletion  Buccal Region Moderate depletion  Temple Region Moderate depletion  Clavicle Bone Region Mild depletion  Clavicle and Acromion Bone Region Mild depletion  Scapular Bone Region Mild depletion  Dorsal Hand Mild depletion  Patellar Region Mild depletion  Anterior Thigh Region Mild depletion  Posterior Calf Region Mild depletion   Edema (RD Assessment) None  Hair Reviewed  Eyes Reviewed  Mouth Reviewed  Skin Reviewed  Nails Reviewed       Diet Order:   Diet Order             Diet regular Room service appropriate? Yes; Fluid consistency: Thin  Diet effective now                   EDUCATION NEEDS:   No education needs have been identified at this time  Skin:  Skin Assessment: Reviewed RN Assessment  Last BM:  1/4  Height:   Ht Readings from Last 1 Encounters:  12/29/23 6' (1.829 m)    Weight:   Wt Readings from Last 1 Encounters:  01/01/24 89.6 kg    Ideal Body Weight:  80.9 kg  BMI:  Body mass index is 26.79 kg/m.  Estimated Nutritional Needs:   Kcal:  1950-2150  Protein:  105-115 gm  Fluid:  2-2.2 L   Suzen HUNT RD, LDN, CNSC Contact Inpatient RD using Secure Chat. If unavailable, use group chat RD Inpatient via Secure Chat in EPIC.

## 2024-01-02 DIAGNOSIS — J181 Lobar pneumonia, unspecified organism: Secondary | ICD-10-CM | POA: Diagnosis not present

## 2024-01-02 DIAGNOSIS — Z7189 Other specified counseling: Secondary | ICD-10-CM | POA: Diagnosis not present

## 2024-01-02 DIAGNOSIS — J9621 Acute and chronic respiratory failure with hypoxia: Secondary | ICD-10-CM | POA: Diagnosis not present

## 2024-01-02 DIAGNOSIS — I480 Paroxysmal atrial fibrillation: Secondary | ICD-10-CM

## 2024-01-02 DIAGNOSIS — I4891 Unspecified atrial fibrillation: Secondary | ICD-10-CM | POA: Diagnosis not present

## 2024-01-02 DIAGNOSIS — J441 Chronic obstructive pulmonary disease with (acute) exacerbation: Secondary | ICD-10-CM

## 2024-01-02 DIAGNOSIS — Z515 Encounter for palliative care: Secondary | ICD-10-CM | POA: Diagnosis not present

## 2024-01-02 DIAGNOSIS — A419 Sepsis, unspecified organism: Secondary | ICD-10-CM | POA: Diagnosis not present

## 2024-01-02 LAB — CBC
HCT: 39.3 % (ref 39.0–52.0)
Hemoglobin: 12.3 g/dL — ABNORMAL LOW (ref 13.0–17.0)
MCH: 30.7 pg (ref 26.0–34.0)
MCHC: 31.3 g/dL (ref 30.0–36.0)
MCV: 98 fL (ref 80.0–100.0)
Platelets: 365 10*3/uL (ref 150–400)
RBC: 4.01 MIL/uL — ABNORMAL LOW (ref 4.22–5.81)
RDW: 13.1 % (ref 11.5–15.5)
WBC: 9.8 10*3/uL (ref 4.0–10.5)
nRBC: 0 % (ref 0.0–0.2)

## 2024-01-02 LAB — BASIC METABOLIC PANEL
Anion gap: 7 (ref 5–15)
BUN: 41 mg/dL — ABNORMAL HIGH (ref 8–23)
CO2: 29 mmol/L (ref 22–32)
Calcium: 8.2 mg/dL — ABNORMAL LOW (ref 8.9–10.3)
Chloride: 100 mmol/L (ref 98–111)
Creatinine, Ser: 1.61 mg/dL — ABNORMAL HIGH (ref 0.61–1.24)
GFR, Estimated: 42 mL/min — ABNORMAL LOW (ref >60)
Glucose, Bld: 126 mg/dL — ABNORMAL HIGH (ref 70–99)
Potassium: 3.5 mmol/L (ref 3.5–5.1)
Sodium: 136 mmol/L (ref 135–145)

## 2024-01-02 LAB — GLUCOSE, CAPILLARY: Glucose-Capillary: 107 mg/dL — ABNORMAL HIGH (ref 70–99)

## 2024-01-02 LAB — MAGNESIUM: Magnesium: 2 mg/dL (ref 1.7–2.4)

## 2024-01-02 MED ORDER — DILTIAZEM HCL 60 MG PO TABS
60.0000 mg | ORAL_TABLET | Freq: Three times a day (TID) | ORAL | Status: DC
  Administered 2024-01-02 (×2): 60 mg via ORAL
  Filled 2024-01-02 (×2): qty 1

## 2024-01-02 MED ORDER — AMIODARONE LOAD VIA INFUSION
150.0000 mg | Freq: Once | INTRAVENOUS | Status: AC
  Administered 2024-01-02: 150 mg via INTRAVENOUS
  Filled 2024-01-02: qty 83.34

## 2024-01-02 MED ORDER — DOXYCYCLINE HYCLATE 100 MG PO TABS
100.0000 mg | ORAL_TABLET | Freq: Two times a day (BID) | ORAL | Status: AC
  Administered 2024-01-02 – 2024-01-05 (×7): 100 mg via ORAL
  Filled 2024-01-02 (×7): qty 1

## 2024-01-02 MED ORDER — CEFDINIR 300 MG PO CAPS
300.0000 mg | ORAL_CAPSULE | Freq: Two times a day (BID) | ORAL | Status: AC
  Administered 2024-01-02 – 2024-01-05 (×7): 300 mg via ORAL
  Filled 2024-01-02 (×7): qty 1

## 2024-01-02 MED ORDER — DILTIAZEM HCL 30 MG PO TABS
30.0000 mg | ORAL_TABLET | Freq: Once | ORAL | Status: AC
  Administered 2024-01-02: 30 mg via ORAL
  Filled 2024-01-02: qty 1

## 2024-01-02 NOTE — Progress Notes (Signed)
 Daily Progress Note   Patient Name: Kyle Owen       Date: 01/02/2024 DOB: 05-02-1941  Age: 83 y.o. MRN#: [id number] Attending Physician: Evonnie Lenis, MD Primary Care Physician: Sheryle Carwin, MD Admit Date: 12/29/2023 Length of Stay: 4 days  Reason for Consultation/Follow-up: Establishing goals of care  HPI/Patient Profile:  83 y.o. 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 He presented with progressive shortness of breath, hypoxia on 2-4 L satting 88% improving to 90% on 6 L, as well as palpitation and dizziness. He was admitted on 12/29/2023 with sepsis due to multifocal pneumonia, acute on chronic respiratory failure with hypoxia, paroxysmal A-fib with RVR, advanced dementia, CKD 3b, and others.    Palliative medicine was consulted for GOC conversations.  Subjective:   Subjective: Chart Reviewed. Updates received. Patient Assessed. Created space and opportunity for patient  and family to explore thoughts and feelings regarding current medical situation.  Today's Discussion: Today I met with the patient was wife illness and daughter [name].  Also on the phone for the meeting was patient's daughter Sharlet who lives in [place] .  We spent a substantial time of time reviewing the patient's current clinical status and getting everybody up-to-date.  They understand he has progressive health conditions, admitted for acute sepsis and A-fib with RVR likely triggered by his sepsis.  They understand that underlying he has advanced dementia which has started progressing more rapidly, especially in the past couple months he has had a precipitous decline.  There are no concerns about safety.  At 1 point his wife shares that he has gotten out of the house and wandered.  At 1 point he fell in the driveway because of this.  We discussed that  he is getting a little bit better every day while he is here regarding his sepsis and RVR.  I shared that he would likely never be back in a normal rhythm, although this is not uncommon.  We shared that his rate is much better controlled on the medicines, this would also continue to improve as his sepsis improves.  We then shifted our focus to the underlying advanced dementia and the understanding is progressive and irreversible.  I shared that at some point we would get to 1 point where we would need to have discussions about hospice and comfort care.  However, we do not seem to be they are quite yet today.  I shared that PT has evaluated him and they are looking at possible discharge to SNF/rehab, although this would require insurance authorization.  We began discussing options for when the patient goes home after rehab.  We explored options such as an home part-time care to assist, especially at night, changing locks, hiding unsafe objects, and the like.  We also discussed that at some point being a full-time caregiver may be too difficult for the patient's wife, which is very common.  I invited her to not feel guilty about this as being a caregiver is exceedingly difficult.  At this time it may be appropriate to look for placement in a memory care type facility, primarily for a quality of life and safety perspective.  They seem to be agreeable to this and principal.  We reviewed care limitations including confirming DNR status.  We also talked about feeding tubes, as it is common in advanced dementia to  have dysphagia and lose swallowing function. We had an extensive discussion about feeding tubes.  I describes feeding tubes as useful in certain situations, although typically excepted to be not beneficial and advanced disease where in the body is shutting down.  In the situations feeding tubes statistically do not prolong life, do not prevent aspiration as patients can aspirate on your own saliva and  secretions, do not improve quality life, and pose a high risk for injury if the patient becomes confused and pulls out the feeding tube.  For these reasons feeding tubes are often not recommended in situations such as this.  At this time all family members agree that the patient would never want a feeding tube.  Finally we discussed the offer of outpatient palliative care.  I explained that this is ongoing goals of care discussions as the patient's health inevitably declines.  All family are in agreement for palliative care.  A referral has already been placed and [facility] in [place] has already reached out and contacted them regarding services.  I told him that being that goals are clear palliative medicine would back off.  We will remain available to them while he is admitted to the hospital for any questions or concerns.  I ensured that everybody had our contact information to call for questions.  I also shared that the medical team and nursing team know how to get ahold of us  as well.  I provided emotional and general support through therapeutic listening, empathy, sharing of stories, therapeutic touch, and other techniques. I answered all questions and addressed all concerns to the best of my ability.  Review of Systems  Unable to perform ROS: Dementia    Objective:   Vital Signs:  BP 126/69   Pulse (!) 123   Temp (!) 97.5 F (36.4 C) (Oral)   Resp (!) 24   Ht 6' (1.829 m)   Wt 89.6 kg   SpO2 99%   BMI 26.79 kg/m   Physical Exam Vitals and nursing note reviewed.  Constitutional:      General: He is not in acute distress.    Appearance: He is ill-appearing.  HENT:     Head: Normocephalic and atraumatic.  Cardiovascular:     Rate and Rhythm: Normal rate. Rhythm irregular.  Pulmonary:     Effort: Pulmonary effort is normal. No respiratory distress.  Abdominal:     General: Abdomen is flat.  Neurological:     Mental Status: He is alert. He is disoriented.      Comments: Pleasantly confused     Palliative Assessment/Data: 40-50%    Existing Vynca/ACP Documentation: Advance directive signed 09/14/2020   Assessment & Plan:   Impression: Present on Admission: . Sepsis (HCC) . Chronic kidney disease, stage 3b (HCC) . Chronic respiratory failure with hypoxia (HCC) . Thoracic ascending aortic aneurysm (HCC) . COPD with chronic bronchitis and emphysema (HCC) . Anxiety . Paroxysmal atrial fibrillation (HCC) . Advanced dementia (HCC) . Hyperlipidemia . Acute on chronic respiratory failure with hypoxia [facility])  83 year old male with acute presentation of chronic comorbidities as described above. His underlying issue is multiple chronic comorbidities including advanced dementia that seems to be rapidly progressing as of late. This is likely due to a couple of recent hospitalizations. His acute picture including sepsis due to pneumonia seems to be improving. I feel he will discharge from the hospital from his acute presentation, although his dementia will continue to progress. Daughter/HCPOA seems to understand the situation quite well. They  are agreeable to outpatient palliative care to follow along, or looking at other care options as his dementia progresses including in-home care versus memory care placement. They appear to have good family support from daughters. They do understand that at some point in the near future hospice discussions would be beneficial. Overall long-term prognosis poor.   SUMMARY OF RECOMMENDATIONS   DNR-limited No feeding tubes Continue current care, hopeful for discharge to SNF/rehab and then home Explored options for progressive care needs as health declines All are in agreement for outpatient palliative care, referral has already been made Goals are clear at this time Palliative medicine will back off at this time Please notify us  of any significant clinical change or new palliative needs  Symptom Management:  Per  primary team PMT is available to assist as needed  Code Status: DNR-limited  Prognosis: Unable to determine  Discharge Planning: Skilled Nursing Facility for rehab with Palliative care service follow-up  Discussed with: [name] family, medical team, nursing team  Thank you for allowing us  to participate in the care of ZACCHEUS EDMISTER PMT will continue to support holistically.  Time Total: 75 min  Detailed review of medical records (labs, imaging, vital signs), medically appropriate exam, discussed with treatment team, counseling and education to patient, family, & staff, documenting clinical information, medication management, coordination of care  Camellia Kays, NP Palliative Medicine Team  Team Phone # [phone] (Nights/Weekends)  08/23/2021, 8:17 AM

## 2024-01-02 NOTE — Progress Notes (Signed)
 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 Presenting today with progressive shortness of breath, hypoxia on 2-4 L satting 88%... Improved to 90% on 6 L, also complaining of palpitation and dizziness.   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   Assessment/Plan: Sepsis -Present on admission -Secondary to pneumonia -12/29/2023 CTA chest negative PE, positive multilobar pneumonia, 4.7 cm ascending thoracic aneurysm -Patient was started on ceftriaxone  and azithromycin  -Sepsis physiology overall has improved  Lobar pneumonia -PCT 0.45 -MRSA screen negative -Transition to cefdinir  and doxycycline  x 3 more days -COVID-19 PCR negative -Blood cultures negative  Acute on chronic respiratory failure with hypoxia -Initially on 6 L -Secondary to pneumonia and COPD exacerbation -Previously on 4 L nasal cannula at home -Currently stable on 2 L -Continue bronchodilators -Continue prednisone   Paroxysmal atrial fibrillation -Appreciate cardiology -Continue IV  amiodarone  -Continue diltiazem  -Continue apixaban   Acute heart failure with preserved EF -Patient was dosed with IV furosemide  01/01/2024 -net neg 2.8L  CKD stage IIIb -Baseline creatinine 1.5-1.8  Mixed hyperlipidemia -Continue statin  Thoracic ascending aortic aneurysm (HCC) -Chronic history of thoracic ascending aortic aneurysm -Based on today's CTA 4.7 cm no changes -Follow-up as an outpatient and monitor closely  Major neurocognitive disorder -At risk for hospital delirium          Family Communication:  no  Family at bedside  Consultants:  cardiology  Code Status: DNR  DVT Prophylaxis:  apixaban    Procedures: As Listed in Progress Note Above  Antibiotics: None        Subjective: Patient denies chest pain or shortness of breath.  He denies abdominal pain, nausea, vomiting.  Remainder review of systems limited secondary to major neurocognitive disorder  Objective: Vitals:   01/02/24 1130 01/02/24 1200 01/02/24 1204 01/02/24 1330  BP: 115/65 (!) 95/57  126/69  Pulse: (!) 107 (!) 123    Resp: 20 19  (!) 24  Temp:   (!) 97.5 F (36.4 C)   TempSrc:   Oral   SpO2: 95% 96%  99%  Weight:      Height:        Intake/Output Summary (Last 24 hours) at 01/02/2024 1419 Last data filed at 01/02/2024 1040 Gross per 24 hour  Intake 240 ml  Output 2450 ml  Net -2210 ml   Weight change:  Exam:  General:  Pt is alert, follows commands appropriately, not in acute distress HEENT: No icterus, No thrush, No neck mass, Martin/AT Cardiovascular: IRRR, S1/S2, no rubs, no gallops Respiratory: Bibasilar crackles.  No wheezing.  Good air movement Abdomen: Soft/+BS, non  tender, non distended, no guarding Extremities: No edema, No lymphangitis, No petechiae, No rashes, no synovitis   Data Reviewed: I have personally reviewed following labs and imaging studies Basic Metabolic Panel: Recent Labs  Lab 12/29/23 0215 12/30/23 0554 12/31/23 0435 01/01/24 0507  01/02/24 0456  NA 137 140 141 137 136  K 3.7 4.1 4.1 4.1 3.5  CL 102 104 104 103 100  CO2 26 27 24 29 29   GLUCOSE 156* 174* 182* 164* 126*  BUN 32* 31* 40* 45* 41*  CREATININE 1.66* 1.32* 1.50* 1.55* 1.61*  CALCIUM  8.9 8.8* 9.1 8.4* 8.2*  MG 2.3  --   --   --  2.0  PHOS 3.2  --   --   --   --    Liver Function Tests: Recent Labs  Lab 12/29/23 0215 12/30/23 0554 12/31/23 0435 01/01/24 0507  AST 31 31 39 39  ALT 20 21 26 30   ALKPHOS 68 60 70 64  BILITOT 1.2 0.8 0.5 0.5  PROT 6.8 6.1* 6.7 5.9*  ALBUMIN 3.0* 2.7* 3.0* 2.5*   No results for input(s): LIPASE, AMYLASE in the last 168 hours. No results for input(s): AMMONIA in the last 168 hours. Coagulation Profile: Recent Labs  Lab 12/29/23 0215  INR 2.0*   CBC: Recent Labs  Lab 12/29/23 0215 12/30/23 0554 12/31/23 0435 01/01/24 0507 01/02/24 0438  WBC 10.2 8.0 13.8* 14.0* 9.8  NEUTROABS 8.4*  --   --   --   --   HGB 12.6* 11.0* 12.2* 11.8* 12.3*  HCT 40.1 35.6* 39.0 37.0* 39.3  MCV 97.8 98.9 98.5 98.4 98.0  PLT 267 258 334 352 365   Cardiac Enzymes: No results for input(s): CKTOTAL, CKMB, CKMBINDEX, TROPONINI in the last 168 hours. BNP: Invalid input(s): POCBNP CBG: Recent Labs  Lab 12/29/23 0819 12/30/23 0805 12/30/23 1602 12/31/23 0808 01/02/24 0733  GLUCAP 112* 154* 151* 179* 107*   HbA1C: No results for input(s): HGBA1C in the last 72 hours. Urine analysis:    Component Value Date/Time   COLORURINE YELLOW 06/13/2016 1120   APPEARANCEUR Clear 12/04/2023 1539   LABSPEC 1.020 06/13/2016 1120   PHURINE 5.5 06/13/2016 1120   GLUCOSEU Negative 12/04/2023 1539   HGBUR NEGATIVE 06/13/2016 1120   BILIRUBINUR Negative 12/04/2023 1539   KETONESUR NEGATIVE 06/13/2016 1120   PROTEINUR Negative 12/04/2023 1539   PROTEINUR NEGATIVE 06/13/2016 1120   UROBILINOGEN negative (A) 05/04/2020 0930   NITRITE Negative 12/04/2023 1539   NITRITE NEGATIVE 06/13/2016 1120   LEUKOCYTESUR Trace (A)  12/04/2023 1539   Sepsis Labs: @LABRCNTIP (procalcitonin:4,lacticidven:4) ) Recent Results (from the past 240 hours)  Culture, blood (single)     Status: None (Preliminary result)   Collection Time: 12/29/23  7:48 AM   Specimen: Left Antecubital; Blood  Result Value Ref Range Status   Specimen Description   Final    LEFT ANTECUBITAL BOTTLES DRAWN AEROBIC AND ANAEROBIC   Special Requests   Final    Blood Culture results may not be optimal due to an inadequate volume of blood received in culture bottles   Culture   Final    NO GROWTH 4 DAYS Performed at [facility], [address] [place], [place] [address]    Report Status PENDING  Incomplete  Culture, blood (x 2)     Status: None (Preliminary result)   Collection Time: 12/29/23  8:10 AM   Specimen: BLOOD LEFT HAND  Result Value Ref Range Status   Specimen Description   Final  BLOOD LEFT HAND BOTTLES DRAWN AEROBIC AND ANAEROBIC   Special Requests Blood Culture adequate volume  Final   Culture   Final    NO GROWTH 4 DAYS Performed at [facility], [address], [place], [place] [address]    Report Status PENDING  Incomplete  Resp panel by RT-PCR (RSV, Flu A&B, Covid) Anterior Nasal Swab     Status: None   Collection Time: 12/29/23  8:22 AM   Specimen: Anterior Nasal Swab  Result Value Ref Range Status   SARS Coronavirus 2 by RT PCR NEGATIVE NEGATIVE Final    Comment: (NOTE) SARS-CoV-2 target nucleic acids are NOT DETECTED.  The SARS-CoV-2 RNA is generally detectable in upper respiratory specimens during the acute phase of infection. The lowest concentration of SARS-CoV-2 viral copies this assay can detect is 138 copies/mL. A negative result does not preclude SARS-Cov-2 infection and should not be used as the sole basis for treatment or other patient management decisions. A negative result may occur with  improper specimen collection/handling, submission of specimen other than nasopharyngeal swab, presence of viral  mutation(s) within the areas targeted by this assay, and inadequate number of viral copies(<138 copies/mL). A negative result must be combined with clinical observations, patient history, and epidemiological information. The expected result is Negative.  Fact Sheet for Patients:  bloggercourse.com  Fact Sheet for Healthcare Providers:  seriousbroker.it  This test is no t yet approved or cleared by the [place]  FDA and  has been authorized for detection and/or diagnosis of SARS-CoV-2 by FDA under an Emergency Use Authorization (EUA). This EUA will remain  in effect (meaning this test can be used) for the duration of the COVID-19 declaration under Section 564(b)(1) of the Act, 21 U.S.C.section 360bbb-3(b)(1), unless the authorization is terminated  or revoked sooner.       Influenza A by PCR NEGATIVE NEGATIVE Final   Influenza B by PCR NEGATIVE NEGATIVE Final    Comment: (NOTE) The Xpert Xpress SARS-CoV-2/FLU/RSV plus assay is intended as an aid in the diagnosis of influenza from Nasopharyngeal swab specimens and should not be used as a sole basis for treatment. Nasal washings and aspirates are unacceptable for Xpert Xpress SARS-CoV-2/FLU/RSV testing.  Fact Sheet for Patients: bloggercourse.com  Fact Sheet for Healthcare Providers: seriousbroker.it  This test is not yet approved or cleared by the [place]  FDA and has been authorized for detection and/or diagnosis of SARS-CoV-2 by FDA under an Emergency Use Authorization (EUA). This EUA will remain in effect (meaning this test can be used) for the duration of the COVID-19 declaration under Section 564(b)(1) of the Act, 21 U.S.C. section 360bbb-3(b)(1), unless the authorization is terminated or revoked.     Resp Syncytial Virus by PCR NEGATIVE NEGATIVE Final    Comment: (NOTE) Fact Sheet for  Patients: bloggercourse.com  Fact Sheet for Healthcare Providers: seriousbroker.it  This test is not yet approved or cleared by the [place]  FDA and has been authorized for detection and/or diagnosis of SARS-CoV-2 by FDA under an Emergency Use Authorization (EUA). This EUA will remain in effect (meaning this test can be used) for the duration of the COVID-19 declaration under Section 564(b)(1) of the Act, 21 U.S.C. section 360bbb-3(b)(1), unless the authorization is terminated or revoked.  Performed at [facility], [address] [place], [place] [address]   MRSA Next Gen by PCR, Nasal     Status: None   Collection Time: 12/29/23  1:13 PM   Specimen: Nasal Mucosa; Nasal Swab  Result Value  Ref Range Status   MRSA by PCR Next Gen NOT DETECTED NOT DETECTED Final    Comment: (NOTE) The GeneXpert MRSA Assay (FDA approved for NASAL specimens only), is one component of a comprehensive MRSA colonization surveillance program. It is not intended to diagnose MRSA infection nor to guide or monitor treatment for MRSA infections. Test performance is not FDA approved in patients less than 73 years old. Performed at [facility], [address], [place], [place] [address]      Scheduled Meds:  apixaban   5 mg Oral BID   atorvastatin   20 mg Oral Daily   Chlorhexidine  Gluconate Cloth  6 each Topical Daily   diltiazem   60 mg Oral TID   feeding supplement  237 mL Oral BID BM   finasteride   5 mg Oral q AM   fluticasone  furoate-vilanterol  1 puff Inhalation Daily   levofloxacin   500 mg Oral Q48H   melatonin  6 mg Oral QHS   multivitamin with minerals  1 tablet Oral Daily   sertraline   50 mg Oral q morning   sodium chloride  flush  3 mL Intravenous Q12H   sodium chloride  flush  3 mL Intravenous Q12H   traZODone   50 mg Oral QHS   Continuous Infusions:  amiodarone  30 mg/hr (01/01/24 2313)    Procedures/Studies: CT Angio Chest PE W and/or  Wo Contrast Result Date: 12/29/2023 CLINICAL DATA:  83 year old male with history of shortness of breath. Low oxygen saturation. Evaluate for pulmonary embolism. EXAM: CT ANGIOGRAPHY CHEST WITH CONTRAST TECHNIQUE: Multidetector CT imaging of the chest was performed using the standard protocol during bolus administration of intravenous contrast. Multiplanar CT image reconstructions and MIPs were obtained to evaluate the vascular anatomy. RADIATION DOSE REDUCTION: This exam was performed according to the departmental dose-optimization program which includes automated exposure control, adjustment of the mA and/or kV according to patient size and/or use of iterative reconstruction technique. CONTRAST:  60mL OMNIPAQUE  IOHEXOL  350 MG/ML SOLN COMPARISON:  Chest CT 11/02/2023. FINDINGS: Comment: Today's study is severely limited for assessment of pulmonary embolism by suboptimal contrast bolus, excessive image artifact from the patient being imaged with the arms in the down position, and extensive patient respiratory motion. Cardiovascular: Within the limitations of today's examination, there is no central or lobar sized filling defect to suggest large pulmonary embolism. Unfortunately, secondary to the limitations of today's examination, segmental and subsegmental sized filling defects can not entirely be excluded. Heart size is normal. There is no significant pericardial fluid, thickening or pericardial calcification. There is aortic atherosclerosis, as well as atherosclerosis of the great vessels of the mediastinum and the coronary arteries, including calcified atherosclerotic plaque in the left main, left anterior descending, left circumflex and right coronary arteries. Thickening and calcification of the aortic valve. Aneurysmal dilatation of the ascending thoracic aorta (4.7 cm in diameter), unchanged. Mediastinum/Nodes: No pathologically enlarged mediastinal or hilar lymph nodes. Esophagus is unremarkable in  appearance. No axillary lymphadenopathy. Lungs/Pleura: Assessment of the lung parenchyma is substantially limited by patient respiratory motion. However, there are clearly widespread areas of thickening of the peribronchovascular interstitium with extensive peribronchovascular ground-glass attenuation and consolidative changes scattered throughout the lungs bilaterally, indicative of widespread bilateral multilobar bronchopneumonia, most severe in the right upper and right lower lobes. No pleural effusions. Upper Abdomen: Aortic atherosclerosis. Exophytic 3 cm low-attenuation lesion in the upper pole of the left kidney, incompletely characterized on today's noncontrast CT examination, but statistically likely cysts (no imaging follow-up recommended). Numerous calcified granulomas in the spleen incidentally  noted. Musculoskeletal: There are no aggressive appearing lytic or blastic lesions noted in the visualized portions of the skeleton. Review of the MIP images confirms the above findings. IMPRESSION: 1. Severely limited examination demonstrating no central or lobar sized pulmonary embolism. Accurate assessment for smaller segmental and subsegmental sized emboli is not possible on the basis of today's examination. 2. Severe multilobar bilateral bronchopneumonia, as above. 3. Aortic atherosclerosis, in addition to left main and three-vessel coronary artery disease. Aneurysmal dilatation of the ascending thoracic aorta (4.7 cm in diameter), similar to the prior study. Ascending thoracic aortic aneurysm. Recommend semi-annual imaging followup by CTA or MRA and referral to cardiothoracic surgery if not already obtained. This recommendation follows 2010 ACCF/AHA/AATS/ACR/ASA/SCA/SCAI/SIR/STS/SVM Guidelines for the Diagnosis and Management of Patients With Thoracic Aortic Disease. Circulation. 2010; 121: Z733-z630. Aortic aneurysm NOS (ICD10-I71.9). Aortic Atherosclerosis (ICD10-I70.0). Aortic aneurysm NOS (ICD10-I71.9).  Electronically Signed   By: Toribio Aye M.D.   On: 12/29/2023 07:13   DG Chest Portable 1 View Result Date: 12/29/2023 CLINICAL DATA:  Shortness of breath, palpitations EXAM: PORTABLE CHEST 1 VIEW COMPARISON:  10/19/2023 FINDINGS: Heart and mediastinal contours within normal limits. Loop recorder device in place. Diffuse interstitial prominence in the lower lobes. No effusions or acute bony abnormality. IMPRESSION: Diffuse interstitial prominence in the lower lobes could reflect edema or infection. Electronically Signed   By: Franky Crease M.D.   On: 12/29/2023 03:16    Alm Schneider, DO  [facility]  If 7PM-7AM, please contact night-coverage [email or web address] Password [email or web address] 01/02/2024, 2:19 PM   LOS: 4 days

## 2024-01-02 NOTE — Evaluation (Signed)
 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 Presenting today with progressive shortness of breath, hypoxia on 2-4 L satting 88%... Improved to 90% on 6 L, also complaining of palpitation and dizziness.   Clinical Impression  Patient demonstrates slow labored movement for sitting up at bedside, unable to maintain standing balance with hand held assist due to BLE weakness, had to use RW and limited to a few slow labored steps at bedside before having to sit due to fall risk.  Patient tolerated sitting up in chair after therapy with his daughter present.  Patient will benefit from continued skilled physical therapy in hospital and recommended venue below to increase strength, balance, endurance for safe ADLs and gait.            If plan is discharge home, recommend the following: A lot of help with bathing/dressing/bathroom;A lot of help with walking and/or transfers;Help with stairs or ramp for entrance;Assistance with cooking/housework   Can travel by private vehicle   No    Equipment Recommendations None recommended by PT  Recommendations for Other Services       Functional Status Assessment Patient has had a recent decline in their functional status and demonstrates the ability to make significant improvements in function in a reasonable and predictable amount of time.     Precautions / Restrictions Precautions Precautions: Fall Restrictions Weight Bearing Restrictions Per Provider Order: No      Mobility  Bed Mobility Overal bed mobility: Needs Assistance Bed Mobility: Supine to Sit     Supine to sit: Min assist, Mod assist, HOB  elevated     General bed mobility comments: increased time, labored movement    Transfers Overall transfer level: Needs assistance Equipment used: Rolling walker (2 wheels) Transfers: Bed to chair/wheelchair/BSC, Sit to/from Stand Sit to Stand: Mod assist   Step pivot transfers: Mod assist       General transfer comment: unsteady labored movement    Ambulation/Gait Ambulation/Gait assistance: Mod assist, Max assist Gait Distance (Feet): 5 Feet Assistive device: Rolling walker (2 wheels) Gait Pattern/deviations: Decreased step length - left, Decreased stance time - right, Decreased stride length, Knees buckling, Shuffle Gait velocity: slow     General Gait Details: limited to a few slow labored unsteady steps at bedside before having to sit due weakness with buckling of knees  Stairs            Wheelchair Mobility     Tilt Bed    Modified Rankin (Stroke Patients Only)       Balance Overall balance assessment: Needs assistance Sitting-balance support: Feet supported, No upper extremity supported Sitting balance-Leahy Scale: Fair Sitting balance - Comments: fair/good seated at EOB   Standing balance support: Reliant on assistive device for balance, During functional activity, Bilateral upper extremity supported Standing balance-Leahy Scale: Poor Standing balance comment: using RW                             Pertinent Vitals/Pain Pain Assessment Pain Assessment: No/denies pain    Home Living Family/patient expects to be discharged to:: Private residence Living Arrangements: Spouse/significant other;Children Available Help at Discharge: Family Type of Home:  House Home Access: Stairs to enter Entrance Stairs-Rails: Right;Left;Can reach both     Home Layout: Two level;Able to live on main level with bedroom/bathroom Home Equipment: Rolling Walker (2 wheels);Cane - single point;BSC/3in1;Grab bars - tub/shower;Grab bars - toilet Additional  Comments: Living in the same place as prior history.    Prior Function Prior Level of Function : Needs assist       Physical Assist : ADLs (physical);Mobility (physical) Mobility (physical): Bed mobility;Transfers;Gait;Stairs   Mobility Comments: household ambulation using SPC ADLs Comments: Assisted for bathing.     Extremity/Trunk Assessment   Upper Extremity Assessment Upper Extremity Assessment: Defer to OT evaluation    Lower Extremity Assessment Lower Extremity Assessment: Generalized weakness    Cervical / Trunk Assessment Cervical / Trunk Assessment: Normal  Communication   Communication Communication: Hearing impairment Cueing Techniques: Verbal cues;Tactile cues  Cognition Arousal: Alert Behavior During Therapy: WFL for tasks assessed/performed Overall Cognitive Status: History of cognitive impairments - at baseline                                          General Comments      Exercises     Assessment/Plan    PT Assessment Patient needs continued PT services  PT Problem List Decreased strength;Decreased activity tolerance;Decreased balance;Decreased mobility       PT Treatment Interventions DME instruction;Gait training;Stair training;Functional mobility training;Therapeutic activities;Therapeutic exercise;Balance training;Patient/family education    PT Goals (Current goals can be found in the Care Plan section)  Acute Rehab PT Goals Patient Stated Goal: return home PT Goal Formulation: With patient/family Time For Goal Achievement: 01/16/24 Potential to Achieve Goals: Good    Frequency Min 3X/week     Co-evaluation PT/OT/SLP Co-Evaluation/Treatment: Yes Reason for Co-Treatment: To address functional/ADL transfers PT goals addressed during session: Mobility/safety with mobility;Balance;Proper use of DME         AM-PAC PT 6 Clicks Mobility  Outcome Measure Help needed turning from your back to your side while in a  flat bed without using bedrails?: A Little Help needed moving from lying on your back to sitting on the side of a flat bed without using bedrails?: A Lot Help needed moving to and from a bed to a chair (including a wheelchair)?: A Lot Help needed standing up from a chair using your arms (e.g., wheelchair or bedside chair)?: A Lot Help needed to walk in hospital room?: A Lot Help needed climbing 3-5 steps with a railing? : Total 6 Click Score: 12    End of Session   Activity Tolerance: Patient tolerated treatment well;Patient limited by fatigue Patient left: in chair;with call bell/phone within reach;with chair alarm set;with family/visitor present Nurse Communication: Mobility status PT Visit Diagnosis: Unsteadiness on feet (R26.81);Other abnormalities of gait and mobility (R26.89);Muscle weakness (generalized) (M62.81)    Time: 9191-9161 PT Time Calculation (min) (ACUTE ONLY): 30 min   Charges:   PT Evaluation $PT Eval Moderate Complexity: 1 Mod PT Treatments $Therapeutic Activity: 23-37 mins PT General Charges $$ ACUTE PT VISIT: 1 Visit         1:20 PM, 01/02/24 Lynwood Music, MPT Physical Therapist with [facility] 336 [phone] office [phone] mobile phone

## 2024-01-02 NOTE — [place] FL2 (Signed)
 [place]  MEDICAID FL2 LEVEL OF CARE FORM     IDENTIFICATION  Patient Name: Kyle Owen Birthdate: 10/01/1941 Sex: male Admission Date (Current Location): 12/29/2023  [facility] and [place] Number:  [employer] and Address:  [facility],  [address], [place] [address]      Provider Number: [id number]  Attending Physician Name and Address:  Evonnie Lenis, MD  Relative Name and Phone Number:  Wooden,Phyllis (Spouse)  [phone]    Current Level of Care: Hospital Recommended Level of Care: Skilled Nursing Facility Prior Approval Number:    Date Approved/Denied:   PASRR Number: [id number]  Discharge Plan: SNF    Current Diagnoses: Patient Active Problem List   Diagnosis Date Noted   Atrial fibrillation with rapid ventricular response (HCC) 01/02/2024   Lobar pneumonia (HCC) 01/02/2024   Sepsis (HCC) 12/29/2023   Advanced dementia (HCC) 12/29/2023   Hyperlipidemia 12/29/2023   Acute on chronic respiratory failure with hypoxia (HCC) 12/29/2023   Pulmonary nodule, left 11/26/2023   COPD GOLD 2 AB 10/31/2023   Chronic kidney disease, stage 3b (HCC) 10/20/2023   COPD exacerbation (HCC) 10/19/2023   Chronic respiratory failure with hypoxia (HCC) 12/05/2022   Aortic disease (HCC) 05/15/2022   Chronic anticoagulation 10/06/2021   History of stroke 10/06/2021   Bladder cancer (HCC) 10/06/2021   Morning headache 04/04/2021   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   COPD with chronic bronchitis and emphysema (HCC) 09/22/2020   Penile pain 04/07/2020   Balanitis 04/07/2020   Pronation of right foot 04/04/2018   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   Facet arthropathy, lumbar 08/31/2017   Lumbar spondylosis 08/31/2017   Cervical myelopathy (HCC) 04/03/2017   Thoracic ascending aortic aneurysm (HCC) 05/09/2016   Paroxysmal atrial fibrillation (HCC)  12/17/2015   S/P partial thyroidectomy 01/20/2015   Anxiety 08/02/2012   Arthritis, degenerative 08/02/2012   Benign fibroma of prostate 08/02/2012   Clinical depression 08/02/2012   Colon polyp 08/02/2012   Coxitis 08/02/2012   Current smoker 08/02/2012   Disorder involving thrombocytopenia (HCC) 08/02/2012   Ocular migraine 08/02/2012    Orientation RESPIRATION BLADDER Height & Weight     Self  O2 (2L  - See DC summary) Incontinent, External catheter Weight: 89.6 kg Height:  6' (182.9 cm)  BEHAVIORAL SYMPTOMS/MOOD NEUROLOGICAL BOWEL NUTRITION STATUS      Continent    AMBULATORY STATUS COMMUNICATION OF NEEDS Skin   Extensive Assist Verbally Bruising, Skin abrasions                       Personal Care Assistance Level of Assistance  Bathing, Feeding, Dressing Bathing Assistance: Maximum assistance Feeding assistance: Limited assistance Dressing Assistance: Maximum assistance     Functional Limitations Info  Sight, Hearing, Speech Sight Info: Impaired Hearing Info: Impaired Speech Info: Adequate    SPECIAL CARE FACTORS FREQUENCY  PT (By licensed PT)     PT Frequency: 5 times a week              Contractures Contractures Info: Not present    Additional Factors Info  Code Status, Allergies Code Status Info: DNR- Limited Allergies Info: Flomax, Fentanyl            Current Medications (01/02/2024):  This is the current hospital active medication list Current Facility-Administered Medications  Medication Dose Route Frequency Provider Last Rate Last Admin   acetaminophen  (TYLENOL ) tablet 650 mg  650 mg Oral Q6H PRN Willette Adriana LABOR, MD   650 mg at 12/30/23 2116   Or   acetaminophen  (TYLENOL ) suppository 650 mg  650 mg Rectal Q6H PRN Willette Adriana LABOR, MD       ALPRAZolam  (XANAX ) tablet 0.25 mg  0.25 mg Oral TID PRN Willette Adriana A, MD   0.25 mg at 01/01/24 2315   amiodarone  (NEXTERONE  PREMIX) 360-4.14 MG/200ML-% (1.8 mg/mL) IV infusion  30 mg/hr  Intravenous Continuous Alvan Dorn FALCON, MD 16.67 mL/hr at 01/01/24 2313 30 mg/hr at 01/01/24 2313   apixaban  (ELIQUIS ) tablet 5 mg  5 mg Oral BID Willette Adriana A, MD   5 mg at 01/02/24 9163   atorvastatin  (LIPITOR) tablet 20 mg  20 mg Oral Daily Shahmehdi, Seyed A, MD   20 mg at 01/02/24 0836   bisacodyl  (DULCOLAX) EC tablet 5 mg  5 mg Oral Daily PRN Willette Adriana LABOR, MD       Chlorhexidine  Gluconate Cloth 2 % PADS 6 each  6 each Topical Daily Shahmehdi, Seyed A, MD   6 each at 01/02/24 9096   diltiazem  (CARDIZEM ) tablet 60 mg  60 mg Oral TID Alvan Dorn FALCON, MD       feeding supplement (ENSURE ENLIVE / ENSURE PLUS) liquid 237 mL  237 mL Oral BID BM Shahmehdi, Seyed A, MD       finasteride  (PROSCAR ) tablet 5 mg  5 mg Oral q AM Shahmehdi, Seyed A, MD   5 mg at 01/02/24 9163   fluticasone  furoate-vilanterol (BREO ELLIPTA ) 200-25 MCG/ACT 1 puff  1 puff Inhalation Daily Shahmehdi, Adriana A, MD   1 puff at 01/02/24 9162   haloperidol  lactate (HALDOL ) injection 2 mg  2 mg Intravenous Q6H PRN Shahmehdi, Seyed A, MD   2 mg at 12/30/23 0435   hydrALAZINE  (APRESOLINE ) injection 10 mg  10 mg Intravenous Q4H PRN Shahmehdi, Seyed A, MD       HYDROmorphone  (DILAUDID ) injection 0.5-1 mg  0.5-1 mg Intravenous Q2H PRN Shahmehdi, Seyed A, MD       ipratropium (ATROVENT ) nebulizer solution 0.5 mg  0.5 mg Nebulization Q6H PRN Shahmehdi, Seyed A, MD       levalbuterol  (XOPENEX ) nebulizer solution 0.63 mg  0.63 mg Nebulization Q6H PRN Shahmehdi, Seyed A, MD       melatonin tablet 6 mg  6 mg Oral QHS Segars, Jonathan, MD   6 mg at 01/01/24 2315   multivitamin with minerals tablet 1 tablet  1 tablet Oral Daily Shahmehdi, Seyed A, MD   1 tablet at 01/02/24 0836   ondansetron  (ZOFRAN ) tablet 4 mg  4 mg Oral Q6H PRN Shahmehdi, Seyed A, MD       Or   ondansetron  (ZOFRAN ) injection 4 mg  4 mg Intravenous Q6H PRN Shahmehdi, Seyed A, MD       Oral care mouth rinse  15 mL Mouth Rinse PRN Shahmehdi, Seyed A, MD        oxyCODONE  (Oxy IR/ROXICODONE ) immediate release tablet 5 mg  5 mg Oral Q4H PRN Shahmehdi, Seyed A, MD       senna-docusate (Senokot-S) tablet 1 tablet  1 tablet Oral QHS PRN Shahmehdi, Seyed A, MD       sertraline  (ZOLOFT ) tablet 50 mg  50 mg Oral q morning Shahmehdi, Seyed A, MD   50 mg at 01/02/24 0836   sodium chloride  flush (NS) 0.9 % injection 3 mL  3 mL Intravenous Q12H Shahmehdi, Seyed A, MD   3 mL  at 01/02/24 9095   sodium chloride  flush (NS) 0.9 % injection 3 mL  3 mL Intravenous Q12H Shahmehdi, Seyed A, MD   3 mL at 01/02/24 0904   sodium phosphate  (FLEET) enema 1 enema  1 enema Rectal Once PRN Shahmehdi, Seyed A, MD       traZODone  (DESYREL ) tablet 50 mg  50 mg Oral QHS Shahmehdi, Seyed A, MD   50 mg at 01/01/24 2314     Discharge Medications: Please see discharge summary for a list of discharge medications.  Relevant Imaging Results:  Relevant Lab Results:   Additional Information SS# [id number]  Sharlyne Stabs, RN

## 2024-01-02 NOTE — Progress Notes (Signed)
 Rounding Note    Patient Name: HUMBERT MOROZOV Date of Encounter: 01/02/2024  [facility] Cardiologist: Oneil Parchment, MD   Subjective   Fatigue, some SOB this AM  Inpatient Medications    Scheduled Meds:  apixaban   5 mg Oral BID   atorvastatin   20 mg Oral Daily   Chlorhexidine  Gluconate Cloth  6 each Topical Daily   diltiazem   30 mg Oral QID   feeding supplement  237 mL Oral BID BM   finasteride   5 mg Oral q AM   fluticasone  furoate-vilanterol  1 puff Inhalation Daily   levofloxacin   500 mg Oral Q48H   melatonin  6 mg Oral QHS   multivitamin with minerals  1 tablet Oral Daily   sertraline   50 mg Oral q morning   sodium chloride  flush  3 mL Intravenous Q12H   sodium chloride  flush  3 mL Intravenous Q12H   traZODone   50 mg Oral QHS   Continuous Infusions:  amiodarone  30 mg/hr (01/01/24 2313)   PRN Meds: acetaminophen  **OR** acetaminophen , ALPRAZolam , bisacodyl , haloperidol  lactate, hydrALAZINE , HYDROmorphone  (DILAUDID ) injection, ipratropium, levalbuterol , ondansetron  **OR** ondansetron  (ZOFRAN ) IV, mouth rinse, oxyCODONE , senna-docusate, sodium phosphate    Vital Signs    Vitals:   01/02/24 0530 01/02/24 0600 01/02/24 0802 01/02/24 0836  BP: 117/73 132/87  113/83  Pulse: (!) 119 (!) 115    Resp: 18 (!) 22    Temp:   (!) 97.5 F (36.4 C)   TempSrc:   Axillary   SpO2: 98% 100%    Weight:      Height:        Intake/Output Summary (Last 24 hours) at 01/02/2024 0855 Last data filed at 01/02/2024 0500 Gross per 24 hour  Intake 421.83 ml  Output 3850 ml  Net -3428.17 ml      01/01/2024    5:00 AM 12/31/2023    4:00 AM 12/30/2023    5:00 AM  Last 3 Weights  Weight (lbs) 197 lb 8.5 oz 196 lb 3.4 oz 190 lb 7.6 oz  Weight (kg) 89.6 kg 89 kg 86.4 kg      Telemetry    Afib rates 140s - Personally Reviewed  ECG    N/a - Personally Reviewed  Physical Exam   GEN: No acute distress.   Neck: No JVD Cardiac: irreg, tachy Respiratory: mild crackles  bilateral bases GI: Soft, nontender, non-distended  MS: No edema; No deformity. Neuro:  Nonfocal  Psych: Normal affect   Labs    High Sensitivity Troponin:  No results for input(s): TROPONINIHS in the last 720 hours.   Chemistry Recent Labs  Lab 12/29/23 0215 12/30/23 0554 12/31/23 0435 01/01/24 0507  NA 137 140 141 137  K 3.7 4.1 4.1 4.1  CL 102 104 104 103  CO2 26 27 24 29   GLUCOSE 156* 174* 182* 164*  BUN 32* 31* 40* 45*  CREATININE 1.66* 1.32* 1.50* 1.55*  CALCIUM  8.9 8.8* 9.1 8.4*  MG 2.3  --   --   --   PROT 6.8 6.1* 6.7 5.9*  ALBUMIN 3.0* 2.7* 3.0* 2.5*  AST 31 31 39 39  ALT 20 21 26 30   ALKPHOS 68 60 70 64  BILITOT 1.2 0.8 0.5 0.5  GFRNONAA 41* 54* 46* 44*  ANIONGAP 9 9 13 5     Lipids No results for input(s): CHOL, TRIG, HDL, LABVLDL, LDLCALC, CHOLHDL in the last 168 hours.  Hematology Recent Labs  Lab 12/31/23 0435 01/01/24 0507 01/02/24 0438  WBC  13.8* 14.0* 9.8  RBC 3.96* 3.76* 4.01*  HGB 12.2* 11.8* 12.3*  HCT 39.0 37.0* 39.3  MCV 98.5 98.4 98.0  MCH 30.8 31.4 30.7  MCHC 31.3 31.9 31.3  RDW 12.9 13.1 13.1  PLT 334 352 365   Thyroid   Recent Labs  Lab 12/29/23 0215  TSH 0.835    BNP Recent Labs  Lab 12/29/23 0215 01/01/24 0507  BNP 585.0* 635.0*    DDimer No results for input(s): DDIMER in the last 168 hours.   Radiology    No results found.  Cardiac Studies    Patient Profile     BRIGGS EDELEN is a 83 y.o. male with a hx of history of PAF, ascending aortic aneurysm, HLD, bladder CA, COPD on 4L [place] at home, prior CVA, dementia who is being seen 12/31/2023 for the evaluation of tachycardia at the request of Dr Willette.   Assessment & Plan    1.PAF - EKG 08/2022 SR, 09/2023 afib and this admit afib. Probably persistent afib - presented with afib with RVR in setting of sepsis - was started on dilt gtt for rate control - he is on eliquis  for stroke prevention   - high heart rates and low bp's on dilt gtt at 15.  We discontinued dilt gtt, started IV amio 12/30/22 -  overall short course of amio given lung disease. When sepsis resolves afib drive should also decrease. Would stop amio at outpatient f/u   -remains on amio 30mg /hr. DIltiazem  30mg  qid added yesterday.  - rates remain elevated 130s to 150s - rebolus amio 150mg , increase dilt to 60mg  tid.  - dementia, DNR status, chronic 4L O2 dependence. Wuuld be hesitant to consider DCCV   2. Sepsis/multifocal pneumonia - per primary team     3. COPD  - on home O2 4L, exacerbation in setting of pneumonia   4. Advanced dementia   5. HFpEF - some signs of fluid overload, uptrending BNP - dosed IV lasix  40mg  x 1 yesterday, net neg 2.8 L - f/u labs, may redose lasix  pending labs   For questions or updates, please contact [facility] Please consult [email or web address] for contact info under        Signed, Alvan Carrier, MD  01/02/2024, 8:55 AM

## 2024-01-02 NOTE — Evaluation (Signed)
 Occupational Therapy Evaluation Patient Details Name: Kyle Owen MRN: [id number] DOB: 11/01/1941 Today's Date: 01/02/2024   History of Present Illness EIN RIJO is a-year-old male with extensive history of advanced dementia, chronic A-fib on Eliquis , BPH, prostate cancer, COPD to dependent 4 L, depression, TIAs, HLD, HTN, OA, DJD, old left pulmonary nodule, chronic thoracic aneurysm.... Presenting today with progressive shortness of breath, hypoxia on 2-4 L satting 88%... Improved to 90% on 6 L, also complaining of palpitation and dizziness. (per MD)   Clinical Impression   Pt agreeable to OT and PT co-evaluation. Pt assist for bathing at baseline. Pt required use of RW and mod A for transfers and min to mod A for bed mobility. B UE weak with limited A/ROM at baseline. Max A for donning lower body ADL's based on pt's inability to doff and don socks today. Pt left in the chair with family present. Pt will benefit from continued OT in the hospital and recommended venue below to increase strength, balance, and endurance for safe ADL's.          If plan is discharge home, recommend the following: A lot of help with walking and/or transfers;A lot of help with bathing/dressing/bathroom;Assistance with cooking/housework;Direct supervision/assist for medications management;Assist for transportation;Help with stairs or ramp for entrance    Functional Status Assessment  Patient has had a recent decline in their functional status and demonstrates the ability to make significant improvements in function in a reasonable and predictable amount of time.  Equipment Recommendations  None recommended by OT           Precautions / Restrictions Precautions Precautions: Fall Restrictions Weight Bearing Restrictions Per Provider Order: No      Mobility Bed Mobility Overal bed mobility: Needs Assistance Bed Mobility: Supine to Sit     Supine to sit: Min assist, Mod assist, HOB elevated      General bed mobility comments: slow labored movement    Transfers Overall transfer level: Needs assistance Equipment used: Rolling walker (2 wheels) Transfers: Bed to chair/wheelchair/BSC, Sit to/from Stand Sit to Stand: Mod assist     Step pivot transfers: Mod assist     General transfer comment: extended time; labored movement      Balance Overall balance assessment: Needs assistance Sitting-balance support: No upper extremity supported, Feet supported Sitting balance-Leahy Scale: Fair Sitting balance - Comments: fair to good seated at EOB   Standing balance support: Bilateral upper extremity supported, During functional activity, Reliant on assistive device for balance Standing balance-Leahy Scale: Poor Standing balance comment: using RW                           ADL either performed or assessed with clinical judgement   ADL Overall ADL's : Needs assistance/impaired         Upper Body Bathing: Minimal assistance;Sitting   Lower Body Bathing: Maximal assistance;Sitting/lateral leans   Upper Body Dressing : Minimal assistance;Sitting   Lower Body Dressing: Maximal assistance;Sitting/lateral leans   Toilet Transfer: Moderate assistance;Rolling walker (2 wheels);Stand-pivot Statistician Details (indicate cue type and reason): Simulated via EOB to chair transfer. Toileting- Clothing Manipulation and Hygiene: Maximal assistance;Bed level       Functional mobility during ADLs: Moderate assistance;Rolling walker (2 wheels)       Vision Baseline Vision/History: 1 Wears glasses Ability to See in Adequate Light: 1 Impaired Patient Visual Report: No change from baseline Vision Assessment?: No apparent visual deficits  Perception Perception: Not tested       Praxis Praxis: Not tested       Pertinent Vitals/Pain Pain Assessment Pain Assessment: No/denies pain     Extremity/Trunk Assessment Upper Extremity Assessment Upper Extremity  Assessment: Generalized weakness;RUE deficits/detail;LUE deficits/detail RUE Deficits / Details: 2+/5 shoulder flexion MMT. Near full P/ROM for shoulder flexion. Generally weak otherwise. LUE Deficits / Details: 2+/5 shoulder flexion MMT. Near full P/ROM for shoulder flexion. Generally weak otherwise.   Lower Extremity Assessment Lower Extremity Assessment: Defer to PT evaluation   Cervical / Trunk Assessment Cervical / Trunk Assessment: Normal   Communication Communication Communication: Hearing impairment   Cognition Arousal: Alert Behavior During Therapy: WFL for tasks assessed/performed Overall Cognitive Status: History of cognitive impairments - at baseline                                                        Home Living Family/patient expects to be discharged to:: Private residence Living Arrangements: Spouse/significant other;Children Available Help at Discharge: Family Type of Home: House Home Access: Stairs to enter Secretary/administrator of Steps: 2 Entrance Stairs-Rails: Right;Left;Can reach both Home Layout: Two level;Able to live on main level with bedroom/bathroom     Bathroom Shower/Tub: Tub/shower unit   Bathroom Toilet: Handicapped height Bathroom Accessibility: Yes   Home Equipment: Agricultural Consultant (2 wheels);Cane - single point;BSC/3in1;Grab bars - tub/shower;Grab bars - toilet   Additional Comments: Living in the same place as prior history.      Prior Functioning/Environment Prior Level of Function : Needs assist       Physical Assist : ADLs (physical)   ADLs (physical): IADLs;Bathing Mobility Comments: Ambulates using cane at baseline. ADLs Comments: Assisted for bathing.        OT Problem List: Decreased strength;Decreased range of motion;Decreased activity tolerance;Impaired balance (sitting and/or standing);Decreased cognition      OT Treatment/Interventions: Self-care/ADL training;Therapeutic  exercise;Therapeutic activities;Patient/family education;Balance training;Cognitive remediation/compensation    OT Goals(Current goals can be found in the care plan section) Acute Rehab OT Goals Patient Stated Goal: improve function OT Goal Formulation: With patient/family Time For Goal Achievement: 01/16/24 Potential to Achieve Goals: Good  OT Frequency: Min 2X/week                                   End of Session Equipment Utilized During Treatment: Rolling walker (2 wheels);Oxygen Nurse Communication: Other (comment) (RN present during parts of the session.)  Activity Tolerance: Patient tolerated treatment well Patient left: in chair;with call bell/phone within reach;with family/visitor present  OT Visit Diagnosis: Unsteadiness on feet (R26.81);Other abnormalities of gait and mobility (R26.89);Muscle weakness (generalized) (M62.81);History of falling (Z91.81)                Time: 9175-9158 OT Time Calculation (min): 17 min Charges:  OT General Charges $OT Visit: 1 Visit OT Evaluation $OT Eval Low Complexity: 1 Low  Brevyn Ring OT, MOT  [name] 01/02/2024, 9:57 AM

## 2024-01-02 NOTE — TOC Initial Note (Addendum)
 Transition of Care [facility]) - Initial/Assessment Note    Patient Details  Name: Kyle Owen MRN: [id number] Date of Birth: Nov 10, 1941  [facility]) CM/SW Contact:    Sharlyne Stabs, RN Phone Number: 01/02/2024, 2:10 PM  Clinical Narrative:      PT eval completed, Patient recommended for SNF. CM spoke with his daughter, Kyle Owen, also his POA.  She is agreeable to SNF. [facility] first choice, TOC checked with [facility] they have no males beds. Kyle Owen also interested in [facility] for rehab. TOC working on [facility] and FL2 to send out.  [place] his wife called, to say [facility] was second choice. Please do not sent to [facility] or [facility]. TOC following for bed offers and to start INS Auth.        Expected Discharge Plan: Skilled Nursing Facility Barriers to Discharge: Continued Medical Work up   Patient Goals and CMS Choice Patient states their goals for this hospitalization and ongoing recovery are:: agreeable to SNF CMS Medicare.gov Compare Post Acute Care list provided to:: Patient Represenative (must comment) Choice offered to / list presented to : Adult Children [place] ownership interest in [facility].provided to:: [facility] POA / Guardian (Daughter - Kyle Owen)    Expected Discharge Plan and Services In-house Referral: Clinical Social Work     Living arrangements for the past 2 months: Single Family Home            Prior Living Arrangements/Services Living arrangements for the past 2 months: Single Family Home Lives with:: Spouse Patient language and need for interpreter reviewed:: Yes Do you feel safe going back to the place where you live?: Yes      Need for Family Participation in Patient Care: Yes (Comment) Care giver support system in place?: Yes (comment) Current home services: DME Criminal Activity/Legal Involvement Pertinent to Current Situation/Hospitalization: No - Comment as needed  Activities of Daily Living   ADL Screening (condition at time of  admission) Independently performs ADLs?: No Does the patient have a NEW difficulty with bathing/dressing/toileting/self-feeding that is expected to last >3 days?: No Does the patient have a NEW difficulty with getting in/out of bed, walking, or climbing stairs that is expected to last >3 days?: No Does the patient have a NEW difficulty with communication that is expected to last >3 days?: No Is the patient deaf or have difficulty hearing?: Yes Does the patient have difficulty seeing, even when wearing glasses/contacts?: Yes Does the patient have difficulty concentrating, remembering, or making decisions?: No  Permission Sought/Granted      Emotional Assessment Appearance:: Appears stated age   Affect (typically observed): Accepting Orientation: : Oriented to Self Alcohol / Substance Use: Not Applicable Psych Involvement: No (comment)  Admission diagnosis:  COPD exacerbation (HCC) [J44.1] Atrial fibrillation with rapid ventricular response (HCC) [I48.91] Sepsis (HCC) [A41.9] Pneumonia of both lungs due to infectious organism, unspecified part of lung [J18.9] Patient Active Problem List   Diagnosis Date Noted   Sepsis (HCC) 12/29/2023   Advanced dementia (HCC) 12/29/2023   Hyperlipidemia 12/29/2023   Acute on chronic respiratory failure with hypoxia (HCC) 12/29/2023   Pulmonary nodule, left 11/26/2023   COPD GOLD 2 AB 10/31/2023   Chronic kidney disease, stage 3b (HCC) 10/20/2023   COPD exacerbation (HCC) 10/19/2023   Chronic respiratory failure with hypoxia (HCC) 12/05/2022   Aortic disease (HCC) 05/15/2022   Chronic anticoagulation 10/06/2021   History of stroke 10/06/2021   Bladder cancer (HCC) 10/06/2021   Morning headache 04/04/2021  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   COPD with chronic bronchitis and emphysema (HCC) 09/22/2020   Penile pain 04/07/2020   Balanitis 04/07/2020   Pronation of right foot  04/04/2018   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   Facet arthropathy, lumbar 08/31/2017   Lumbar spondylosis 08/31/2017   Cervical myelopathy (HCC) 04/03/2017   Thoracic ascending aortic aneurysm (HCC) 05/09/2016   Paroxysmal atrial fibrillation (HCC) 12/17/2015   S/P partial thyroidectomy 01/20/2015   Anxiety 08/02/2012   Arthritis, degenerative 08/02/2012   Benign fibroma of prostate 08/02/2012   Clinical depression 08/02/2012   Colon polyp 08/02/2012   Coxitis 08/02/2012   Current smoker 08/02/2012   Disorder involving thrombocytopenia (HCC) 08/02/2012   Ocular migraine 08/02/2012   PCP:  Sheryle Carwin, MD Pharmacy:   [facility] - [place], [place] - [address] [place] [address] Phone: [phone] Fax: [phone]   Social Drivers of Health (SDOH) Social History: SDOH Screenings   Food Insecurity: No Food Insecurity (12/29/2023)  Housing: Unknown (12/29/2023)  Transportation Needs: No Transportation Needs (12/29/2023)  Utilities: Not At Risk (12/29/2023)  Social Connections: Socially Integrated (12/29/2023)  Tobacco Use: Medium Risk (12/29/2023)   SDOH Interventions:     Readmission Risk Interventions    12/30/2023   12:30 PM  Readmission Risk Prevention Plan  Transportation Screening Complete  Medication Review Oceanographer) Complete  HRI or Home Care Consult Complete  SW Recovery Care/Counseling Consult Complete  Palliative Care Screening Not Applicable  Skilled Nursing Facility Not Applicable

## 2024-01-02 NOTE — Plan of Care (Signed)
  Problem: Acute Rehab PT Goals(only PT should resolve) Goal: Pt Will Go Supine/Side To Sit Outcome: Progressing Flowsheets (Taken 01/02/2024 1321) Pt will go Supine/Side to Sit: with minimal assist Goal: Patient Will Transfer Sit To/From Stand Outcome: Progressing Flowsheets (Taken 01/02/2024 1321) Patient will transfer sit to/from stand: with minimal assist Goal: Pt Will Transfer Bed To Chair/Chair To Bed Outcome: Progressing Flowsheets (Taken 01/02/2024 1321) Pt will Transfer Bed to Chair/Chair to Bed: with min assist Goal: Pt Will Ambulate Outcome: Progressing Flowsheets (Taken 01/02/2024 1321) Pt will Ambulate:  25 feet  with moderate assist  with minimal assist  with rolling walker   1:21 PM, 01/02/24 Lynwood Music, MPT Physical Therapist with [facility] [phone] office [phone] mobile phone

## 2024-01-02 NOTE — Progress Notes (Signed)
 Speech Language Pathology Treatment: Dysphagia  Patient Details Name: ATHA MCBAIN MRN: [id number] DOB: 11/26/1941 Today's Date: 01/02/2024 Time: 8399-8385 SLP Time Calculation (min) (ACUTE ONLY): 14 min  Assessment / Plan / Recommendation Clinical Impression  Ongoing diagnostic dysphagia therapy provided today. Pt consumed thin liquids without overt s/sx of aspiration. Pt's wife reports continuing to feed him but that at home and at baseline he feeds himself. SLP reviewed universal aspiration precautions. Recommend continue with regular diet and thin liquids. Meds are ok whole with liquids. There are no further ST needs noted at this time, Our service will sign off. 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 Presenting today with progressive shortness of breath, hypoxia on 2-4 L satting 88%... Improved to 90% on 6 L, also complaining of palpitation and dizziness.      SLP Plan  Continue with current plan of care      Recommendations for follow up therapy are one component of a multi-disciplinary discharge planning process, led by the attending physician.  Recommendations may be updated based on patient status, additional functional criteria and insurance authorization.    Recommendations  Diet recommendations: Thin liquid;Regular Liquids provided via: Cup;Straw Medication Administration: Whole meds with liquid Supervision: Staff to assist with self feeding;Intermittent supervision to cue for compensatory strategies;Trained caregiver to feed patient Compensations: Minimize environmental distractions;Slow rate;Small sips/bites                  Oral care BID     Dysphagia, unspecified (R13.10)     Continue with current plan of care    Eldrick Penick H.  Clois KILLIAN, CCC-SLP Speech Language Pathologist  Raguel VEAR Clois  01/02/2024, 4:14 PM

## 2024-01-02 NOTE — Plan of Care (Signed)
  Problem: Acute Rehab OT Goals (only OT should resolve) Goal: Pt. Will Perform Grooming Flowsheets (Taken 01/02/2024 0959) Pt Will Perform Grooming:  with modified independence  sitting Goal: Pt. Will Perform Upper Body Dressing Flowsheets (Taken 01/02/2024 0959) Pt Will Perform Upper Body Dressing: with modified independence Goal: Pt. Will Perform Lower Body Dressing Flowsheets (Taken 01/02/2024 0959) Pt Will Perform Lower Body Dressing: with modified independence Goal: Pt. Will Transfer To Toilet Flowsheets (Taken 01/02/2024 [phone]) Pt Will Transfer to Toilet: with modified independence Goal: Pt. Will Perform Toileting-Clothing Manipulation Flowsheets (Taken 01/02/2024 0959) Pt Will Perform Toileting - Clothing Manipulation and hygiene: with modified independence Goal: Pt/Caregiver Will Perform Home Exercise Program Flowsheets (Taken 01/02/2024 [phone]) Pt/caregiver will Perform Home Exercise Program:  Increased ROM  Increased strength  Independently  Both right and left upper extremity  Whitley Patchen OT, MOT

## 2024-01-02 NOTE — Progress Notes (Signed)
   Advanced Care Planning Note   Patient Name: Kyle Owen       Date: 01/02/2024 DOB: 03/27/1941  Age: 83 y.o. MRN#: [id number] Attending Physician: Evonnie Lenis, MD Primary Care Physician: Sheryle Carwin, MD Admit Date: 12/29/2023 Length of Stay: 4 days  Advanced Care Planning Details:   Persons Present: ACP Persons Present: [name] Daughter(s) [name] (in person), Sharlet (via telephone). [name] Spouse: Tilton  Provider(s) Present: Palliative Medicine Provider Camellia Kays, NP  All persons present were open and agreeable to conversation about advanced care planning. Education offered on the importance of documentation and the logistics of securing advanced directives.   We had in-depth discussion on overall health, acute illness, chronic illness, trajectory of illness, anticipatory care needs.  Education was offered on the following:   Medical treatment options available, Possible risks and benefits of each option/intervention, and Other CODE STATUS, reviewed/confirmed HCPOA  Forms completed include:  Other NONE, HCPOA, DNR already on file These forms can be found in the ACP link in the area under the patient's picture  After conversation, patient and/or family determined the desired discharge disposition at this time would be: Skilled Nursing Facility for rehab with Palliative care service follow-up   Summary of ACP Decisions:   Confirmed DNR/DNI No feeding tubes Continue to treat the treatable in the interim Outpatient palliative care for ongoing GOC conversations     Thank you for allowing us  to participate in the care of [name] PMT will continue to support holistically.  Total ACP Time: 20 min  The time above is specific to time spend on advanced care planning. It does not include any separately billed services.  Signed by: Camellia Kays, DNP, AGNP-C Palliative Medicine Team  Team Phone # [phone] (Nights/Weekends)  01/02/2024, 4:39 PM

## 2024-01-03 ENCOUNTER — Encounter: Payer: Self-pay | Admitting: Internal Medicine

## 2024-01-03 ENCOUNTER — Inpatient Hospital Stay (HOSPITAL_COMMUNITY): Payer: Medicare Other

## 2024-01-03 ENCOUNTER — Other Ambulatory Visit (HOSPITAL_COMMUNITY): Payer: Self-pay | Admitting: *Deleted

## 2024-01-03 DIAGNOSIS — Z7189 Other specified counseling: Secondary | ICD-10-CM | POA: Diagnosis not present

## 2024-01-03 DIAGNOSIS — J181 Lobar pneumonia, unspecified organism: Secondary | ICD-10-CM | POA: Diagnosis not present

## 2024-01-03 DIAGNOSIS — I4891 Unspecified atrial fibrillation: Secondary | ICD-10-CM

## 2024-01-03 DIAGNOSIS — Z515 Encounter for palliative care: Secondary | ICD-10-CM | POA: Diagnosis not present

## 2024-01-03 DIAGNOSIS — J441 Chronic obstructive pulmonary disease with (acute) exacerbation: Secondary | ICD-10-CM | POA: Diagnosis not present

## 2024-01-03 DIAGNOSIS — J9621 Acute and chronic respiratory failure with hypoxia: Secondary | ICD-10-CM | POA: Diagnosis not present

## 2024-01-03 DIAGNOSIS — A419 Sepsis, unspecified organism: Secondary | ICD-10-CM | POA: Diagnosis not present

## 2024-01-03 LAB — BASIC METABOLIC PANEL
Anion gap: 6 (ref 5–15)
BUN: 38 mg/dL — ABNORMAL HIGH (ref 8–23)
CO2: 31 mmol/L (ref 22–32)
Calcium: 8.1 mg/dL — ABNORMAL LOW (ref 8.9–10.3)
Chloride: 100 mmol/L (ref 98–111)
Creatinine, Ser: 1.49 mg/dL — ABNORMAL HIGH (ref 0.61–1.24)
GFR, Estimated: 46 mL/min — ABNORMAL LOW (ref >60)
Glucose, Bld: 117 mg/dL — ABNORMAL HIGH (ref 70–99)
Potassium: 3.6 mmol/L (ref 3.5–5.1)
Sodium: 137 mmol/L (ref 135–145)

## 2024-01-03 LAB — CBC
HCT: 39.8 % (ref 39.0–52.0)
Hemoglobin: 12.6 g/dL — ABNORMAL LOW (ref 13.0–17.0)
MCH: 31.1 pg (ref 26.0–34.0)
MCHC: 31.7 g/dL (ref 30.0–36.0)
MCV: 98.3 fL (ref 80.0–100.0)
Platelets: 351 10*3/uL (ref 150–400)
RBC: 4.05 MIL/uL — ABNORMAL LOW (ref 4.22–5.81)
RDW: 13.1 % (ref 11.5–15.5)
WBC: 10.1 10*3/uL (ref 4.0–10.5)
nRBC: 0 % (ref 0.0–0.2)

## 2024-01-03 LAB — ECHOCARDIOGRAM COMPLETE
AR max vel: 2.08 cm2
AV Area VTI: 1.99 cm2
AV Area mean vel: 2.06 cm2
AV Mean grad: 8 mm[Hg]
AV Peak grad: 15.5 mm[Hg]
Ao pk vel: 1.97 m/s
Area-P 1/2: 2.83 cm2
Height: 72 in
S' Lateral: 2.1 cm
Weight: 3160.51 [oz_av]

## 2024-01-03 LAB — CULTURE, BLOOD (ROUTINE X 2): Special Requests: ADEQUATE

## 2024-01-03 LAB — CULTURE, BLOOD (SINGLE)

## 2024-01-03 LAB — MAGNESIUM: Magnesium: 2 mg/dL (ref 1.7–2.4)

## 2024-01-03 LAB — GLUCOSE, CAPILLARY: Glucose-Capillary: 93 mg/dL (ref 70–99)

## 2024-01-03 MED ORDER — DILTIAZEM HCL 60 MG PO TABS: 90.0000 mg | ORAL_TABLET | Freq: Three times a day (TID) | ORAL | Status: DC

## 2024-01-03 MED ORDER — DILTIAZEM HCL 60 MG PO TABS
60.0000 mg | ORAL_TABLET | Freq: Three times a day (TID) | ORAL | Status: DC
Start: 2024-01-03 — End: 2024-01-04
  Administered 2024-01-03 (×3): 60 mg via ORAL
  Filled 2024-01-03 (×3): qty 1

## 2024-01-03 MED ORDER — AMIODARONE HCL 200 MG PO TABS
400.0000 mg | ORAL_TABLET | Freq: Two times a day (BID) | ORAL | Status: DC
  Administered 2024-01-03 – 2024-01-07 (×9): 400 mg via ORAL
  Filled 2024-01-03 (×9): qty 2

## 2024-01-03 MED ORDER — AMIODARONE LOAD VIA INFUSION
150.0000 mg | Freq: Once | INTRAVENOUS | Status: DC
  Filled 2024-01-03: qty 83.34

## 2024-01-03 NOTE — TOC Progression Note (Signed)
 Transition of Care [facility]) - Progression Note    Patient Details  Name: Kyle Owen MRN: [id number] Date of Birth: Mar 31, 1941  Transition of Care [facility]) CM/SW Contact  Rollo Petri, LCSW Phone Number: 01/03/2024, 11:33 AM  Clinical Narrative:     TOC following. Spoke with Destiny at [facility] who states they can offer bed for pt on Monday.   Updated pt's dtr/POA who accepts. Updated MD. WONDA will follow for insurance auth and continued dc planning.  Expected Discharge Plan: Skilled Nursing Facility Barriers to Discharge: Continued Medical Work up  Expected Discharge Plan and Services In-house Referral: Clinical Social Work     Living arrangements for the past 2 months: Single Family Home                                       Social Determinants of Health (SDOH) Interventions SDOH Screenings   Food Insecurity: No Food Insecurity (12/29/2023)  Housing: Unknown (12/29/2023)  Transportation Needs: No Transportation Needs (12/29/2023)  Utilities: Not At Risk (12/29/2023)  Social Connections: Socially Integrated (12/29/2023)  Tobacco Use: Medium Risk (12/29/2023)    Readmission Risk Interventions    12/30/2023   12:30 PM  Readmission Risk Prevention Plan  Transportation Screening Complete  Medication Review (RN Care Manager) Complete  HRI or Home Care Consult Complete  SW Recovery Care/Counseling Consult Complete  Palliative Care Screening Not Applicable  Skilled Nursing Facility Not Applicable

## 2024-01-03 NOTE — Progress Notes (Addendum)
 Daily Progress Note   Patient Name: Kyle Owen       Date: 01/03/2024 DOB: [birth year]  Age: 83 y.o. MRN#: [id number] Attending Physician: Evonnie Lenis, MD Primary Care Physician: Sheryle Carwin, MD Admit Date: 12/29/2023 Length of Stay: 5 days  Reason for Consultation/Follow-up: Establishing goals of care  HPI/Patient Profile:  83 y.o. 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 He presented with progressive shortness of breath, hypoxia on 2-4 L satting 88% improving to 90% on 6 L, as well as palpitation and dizziness. He was admitted on 12/29/2023 with sepsis due to multifocal pneumonia, acute on chronic respiratory failure with hypoxia, paroxysmal A-fib with RVR, advanced dementia, CKD 3b, and others.    Palliative medicine was consulted for GOC conversations.  Subjective:   Subjective: Chart Reviewed. Updates received. Patient Assessed. Created space and opportunity for patient  and family to explore thoughts and feelings regarding current medical situation.  Today's Discussion: Today I met with the patient at the bedside, his daughter Kyle Owen was present.  We reviewed our conversation from yesterday including anticipatory care needs. We celebrated that the patient converted to SR this morning and is feeling better overall. Also, she is happy he was offered a rehab bed for Monday at [facility]. We again discussed concerns about patient safety in the home with recent wandering, sharp objects (has previously cut his oxygen tubing), and others. Discussed again the likelihood he would need 24/7 care in the near future.  At this point his goals are clear. Palliative medicine will be back on service Monday and will be available if needed for any significant clinical change or new needs.  I provided emotional and general support through  therapeutic listening, empathy, sharing of stories, therapeutic touch, and other techniques. I answered all questions and addressed all concerns to the best of my ability.  Review of Systems  Unable to perform ROS: Dementia    Objective:   Vital Signs:  BP 132/64   Pulse 71   Temp (!) 97.4 F (36.3 C) (Oral)   Resp 19   Ht 6' (1.829 m)   Wt 89.6 kg   SpO2 100%   BMI 26.79 kg/m   Physical Exam Vitals and nursing note reviewed.  Constitutional:      General: He is not in acute distress.    Appearance: He is ill-appearing.  HENT:     Head: Normocephalic and atraumatic.  Cardiovascular:     Rate and Rhythm: Normal rate. Rhythm irregular.  Pulmonary:     Effort: Pulmonary effort is normal. No respiratory distress.  Abdominal:     General: Abdomen is flat.  Neurological:     Mental Status: He is alert. He is disoriented.     Comments: Pleasantly confused     Palliative Assessment/Data: 40-50%    Existing Vynca/ACP Documentation: Advance directive signed 09/14/2020   Assessment & Plan:   Impression: Present on Admission: . Sepsis (HCC) . Chronic kidney disease, stage 3b (HCC) . Chronic respiratory failure with hypoxia (HCC) . Thoracic ascending aortic aneurysm (HCC) . COPD with chronic bronchitis and emphysema (HCC) . Anxiety . Paroxysmal atrial fibrillation (HCC) . Advanced dementia (HCC) . Hyperlipidemia . Acute on chronic respiratory failure with hypoxia [facility])  83 year old male with acute presentation of chronic comorbidities as described above. His underlying issue is multiple chronic comorbidities including advanced  dementia that seems to be rapidly progressing as of late. This is likely due to a couple of recent hospitalizations. His acute picture including sepsis due to pneumonia seems to be improving. I feel he will discharge from the hospital from his acute presentation, although his dementia will continue to progress. Daughter/HCPOA seems to understand  the situation quite well. They are agreeable to outpatient palliative care to follow along, or looking at other care options as his dementia progresses including in-home care versus memory care placement. They appear to have good family support from daughters. They do understand that at some point in the near future hospice discussions would be beneficial. Overall long-term prognosis poor.   SUMMARY OF RECOMMENDATIONS   DNR-limited No feeding tubes Continue current care, anticipate discharge to SNF/rehab Monday All are in agreement for outpatient palliative care, referral has already been made Goals are clear at this time Palliative medicine will back off at this time Please notify us  of any significant clinical change or new palliative needs  Symptom Management:  Per primary team PMT is available to assist as needed  Code Status: DNR-limited  Prognosis: Unable to determine  Discharge Planning: Skilled Nursing Facility for rehab with Palliative care service follow-up  Discussed with: [name] family, medical team, nursing team  Thank you for allowing us  to participate in the care of Kyle Owen PMT will continue to support holistically.  Time Total: 30 min  Detailed review of medical records (labs, imaging, vital signs), medically appropriate exam, discussed with treatment team, counseling and education to patient, family, & staff, documenting clinical information, medication management, coordination of care  Camellia Kays, NP Palliative Medicine Team  Team Phone # [phone] (Nights/Weekends)  08/23/2021, 8:17 AM

## 2024-01-03 NOTE — Progress Notes (Addendum)
 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 Presenting today with progressive shortness of breath, hypoxia on 2-4 L satting 88%... Improved to 90% on 6 L, also complaining of palpitation and dizziness.   ED evaluation/course: Vitals:   12/29/23 0645 12/29/23 0703  BP: 135/81 118/66  Pulse: (!) 123 (!) 123  Resp: (!) 36 (!) 22  Temp:    SpO2: 94% 91%  Currently on 6 L of oxygen Labs: Glucose 156, BUN 32, creatinine 1.66, albumin 3.0, BNP 585.0, WBC 10.2, CT angiogram multiple artifact negative for any obvious pulmonary embolism, multilobar pneumonia , 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 Chest x-ray: Diffuse superficial prominence in the lower lobe could reflect edema or infection  Patient started on Cardizem , IV fluids, IV antibiotics of azithromycin  and Rocephin  The patient's hospitalization has been prolonged secondary to atrial fibrillation with RVR and soft blood pressures which have made his heart rates difficult to control Cardiology was consulted this is management Patient was started on amiodarone , and it was rebolused. P.o. diltiazem  was reintroduced to help with heart rates.   Assessment/Plan: Sepsis -Present on admission -Secondary to pneumonia -12/29/2023 CTA chest negative PE, positive multilobar pneumonia, 4.7 cm ascending thoracic aneurysm -Patient was started on ceftriaxone  and azithromycin  -Sepsis physiology overall has improved   Lobar pneumonia -PCT 0.45 -MRSA screen negative -Transition to cefdinir  and doxycycline  x 3 more days -COVID-19 PCR negative -Blood cultures negative   Acute on chronic respiratory failure with hypoxia -Initially  on 6 L -Secondary to pneumonia and COPD exacerbation -Previously on 4 L nasal cannula at home -Currently stable on 2 L -Continue bronchodilators -finished steroid burst -no further wheezing -1/9--personally reviewed CXR--mild increased interstitial markings, improving bibasilar opacities   Paroxysmal atrial fibrillation -Appreciate cardiology -1/9--converted to sinus early am -Continue IV amiodarone >>po amio on 1/9 -Continue diltiazem  -Continue apixaban  -01/03/24 echo--EF 65-70%, no WMA, normal RVF, mild TR   Acute heart failure with preserved EF -Patient was dosed with IV furosemide  01/01/2024 -net neg 3.2L -01/03/24 echo--EF 65-70%, no WMA, normal RVF, mild TR   CKD stage IIIb -Baseline creatinine 1.5-1.8   Mixed hyperlipidemia -Continue statin   Thoracic ascending aortic aneurysm (HCC) -Chronic history of thoracic ascending aortic aneurysm -Based on today's CTA 4.7 cm no changes -Follow-up as an outpatient and monitor closely   Major neurocognitive disorder -At risk for hospital delirium                   Family Communication:  no  Family at bedside   Consultants:  cardiology   Code Status: DNR   DVT Prophylaxis:  apixaban      Procedures: As Listed in Progress Note Above   Antibiotics: None               Subjective: Patient denies fevers, chills, headache, chest pain, dyspnea, nausea, vomiting, diarrhea, abdominal pain, dysuria   Objective: Vitals:   01/03/24 1100 01/03/24 1200 01/03/24 1300 01/03/24 1400  BP: (!) 113/56 (!) 144/71 134/60 132/64  Pulse: 62  71   Resp: 17 18 (!) 24 19  Temp:      TempSrc:      SpO2: 93%  100%   Weight:  Height:        Intake/Output Summary (Last 24 hours) at 01/03/2024 1501 Last data filed at 01/03/2024 0921 Gross per 24 hour  Intake 3 ml  Output 1000 ml  Net -997 ml   Weight change:  Exam:  General:  Pt is alert, follows commands appropriately, not in acute distress HEENT: No icterus, No  thrush, No neck mass, [place]/AT Cardiovascular: RRR, S1/S2, no rubs, no gallops Respiratory: bibasilar rales. No wheeze Abdomen: Soft/+BS, non tender, non distended, no guarding Extremities: No edema, No lymphangitis, No petechiae, No rashes, no synovitis   Data Reviewed: I have personally reviewed following labs and imaging studies Basic Metabolic Panel: Recent Labs  Lab 12/29/23 0215 12/30/23 0554 12/31/23 0435 01/01/24 0507 01/02/24 0456 01/03/24 0448  NA 137 140 141 137 136 137  K 3.7 4.1 4.1 4.1 3.5 3.6  CL 102 104 104 103 100 100  CO2 26 27 24 29 29 31   GLUCOSE 156* 174* 182* 164* 126* 117*  BUN 32* 31* 40* 45* 41* 38*  CREATININE 1.66* 1.32* 1.50* 1.55* 1.61* 1.49*  CALCIUM  8.9 8.8* 9.1 8.4* 8.2* 8.1*  MG 2.3  --   --   --  2.0 2.0  PHOS 3.2  --   --   --   --   --    Liver Function Tests: Recent Labs  Lab 12/29/23 0215 12/30/23 0554 12/31/23 0435 01/01/24 0507  AST 31 31 39 39  ALT 20 21 26 30   ALKPHOS 68 60 70 64  BILITOT 1.2 0.8 0.5 0.5  PROT 6.8 6.1* 6.7 5.9*  ALBUMIN 3.0* 2.7* 3.0* 2.5*   No results for input(s): LIPASE, AMYLASE in the last 168 hours. No results for input(s): AMMONIA in the last 168 hours. Coagulation Profile: Recent Labs  Lab 12/29/23 0215  INR 2.0*   CBC: Recent Labs  Lab 12/29/23 0215 12/30/23 0554 12/31/23 0435 01/01/24 0507 01/02/24 0438 01/03/24 0448  WBC 10.2 8.0 13.8* 14.0* 9.8 10.1  NEUTROABS 8.4*  --   --   --   --   --   HGB 12.6* 11.0* 12.2* 11.8* 12.3* 12.6*  HCT 40.1 35.6* 39.0 37.0* 39.3 39.8  MCV 97.8 98.9 98.5 98.4 98.0 98.3  PLT 267 258 334 352 365 351   Cardiac Enzymes: No results for input(s): CKTOTAL, CKMB, CKMBINDEX, TROPONINI in the last 168 hours. BNP: Invalid input(s): POCBNP CBG: Recent Labs  Lab 12/30/23 0805 12/30/23 1602 12/31/23 0808 01/02/24 0733 01/03/24 0744  GLUCAP 154* 151* 179* 107* 93   HbA1C: No results for input(s): HGBA1C in the last 72 hours. Urine  analysis:    Component Value Date/Time   COLORURINE YELLOW 06/13/2016 1120   APPEARANCEUR Clear 12/04/2023 1539   LABSPEC 1.020 06/13/2016 1120   PHURINE 5.5 06/13/2016 1120   GLUCOSEU Negative 12/04/2023 1539   HGBUR NEGATIVE 06/13/2016 1120   BILIRUBINUR Negative 12/04/2023 1539   KETONESUR NEGATIVE 06/13/2016 1120   PROTEINUR Negative 12/04/2023 1539   PROTEINUR NEGATIVE 06/13/2016 1120   UROBILINOGEN negative (A) 05/04/2020 0930   NITRITE Negative 12/04/2023 1539   NITRITE NEGATIVE 06/13/2016 1120   LEUKOCYTESUR Trace (A) 12/04/2023 1539   Sepsis Labs: @LABRCNTIP (procalcitonin:4,lacticidven:4) ) Recent Results (from the past 240 hours)  Culture, blood (single)     Status: None   Collection Time: 12/29/23  7:48 AM   Specimen: Left Antecubital; Blood  Result Value Ref Range Status   Specimen Description   Final    LEFT ANTECUBITAL BOTTLES DRAWN AEROBIC AND  ANAEROBIC   Special Requests   Final    Blood Culture results may not be optimal due to an inadequate volume of blood received in culture bottles   Culture   Final    NO GROWTH 5 DAYS Performed at [facility], [address], [place], [place] [address]    Report Status 01/03/2024 FINAL  Final  Culture, blood (x 2)     Status: None   Collection Time: 12/29/23  8:10 AM   Specimen: BLOOD LEFT HAND  Result Value Ref Range Status   Specimen Description   Final    BLOOD LEFT HAND BOTTLES DRAWN AEROBIC AND ANAEROBIC   Special Requests Blood Culture adequate volume  Final   Culture   Final    NO GROWTH 5 DAYS Performed at [facility], [address] [place], [place] [address]    Report Status 01/03/2024 FINAL  Final  Resp panel by RT-PCR (RSV, Flu A&B, Covid) Anterior Nasal Swab     Status: None   Collection Time: 12/29/23  8:22 AM   Specimen: Anterior Nasal Swab  Result Value Ref Range Status   SARS Coronavirus 2 by RT PCR NEGATIVE NEGATIVE Final    Comment: (NOTE) SARS-CoV-2 target nucleic acids are NOT  DETECTED.  The SARS-CoV-2 RNA is generally detectable in upper respiratory specimens during the acute phase of infection. The lowest concentration of SARS-CoV-2 viral copies this assay can detect is 138 copies/mL. A negative result does not preclude SARS-Cov-2 infection and should not be used as the sole basis for treatment or other patient management decisions. A negative result may occur with  improper specimen collection/handling, submission of specimen other than nasopharyngeal swab, presence of viral mutation(s) within the areas targeted by this assay, and inadequate number of viral copies(<138 copies/mL). A negative result must be combined with clinical observations, patient history, and epidemiological information. The expected result is Negative.  Fact Sheet for Patients:  bloggercourse.com  Fact Sheet for Healthcare Providers:  seriousbroker.it  This test is no t yet approved or cleared by the [place]  FDA and  has been authorized for detection and/or diagnosis of SARS-CoV-2 by FDA under an Emergency Use Authorization (EUA). This EUA will remain  in effect (meaning this test can be used) for the duration of the COVID-19 declaration under Section 564(b)(1) of the Act, 21 U.S.C.section 360bbb-3(b)(1), unless the authorization is terminated  or revoked sooner.       Influenza A by PCR NEGATIVE NEGATIVE Final   Influenza B by PCR NEGATIVE NEGATIVE Final    Comment: (NOTE) The Xpert Xpress SARS-CoV-2/FLU/RSV plus assay is intended as an aid in the diagnosis of influenza from Nasopharyngeal swab specimens and should not be used as a sole basis for treatment. Nasal washings and aspirates are unacceptable for Xpert Xpress SARS-CoV-2/FLU/RSV testing.  Fact Sheet for Patients: bloggercourse.com  Fact Sheet for Healthcare Providers: seriousbroker.it  This test is not yet  approved or cleared by the [place]  FDA and has been authorized for detection and/or diagnosis of SARS-CoV-2 by FDA under an Emergency Use Authorization (EUA). This EUA will remain in effect (meaning this test can be used) for the duration of the COVID-19 declaration under Section 564(b)(1) of the Act, 21 U.S.C. section 360bbb-3(b)(1), unless the authorization is terminated or revoked.     Resp Syncytial Virus by PCR NEGATIVE NEGATIVE Final    Comment: (NOTE) Fact Sheet for Patients: bloggercourse.com  Fact Sheet for Healthcare Providers: seriousbroker.it  This test is not yet approved or  cleared by the [place]  FDA and has been authorized for detection and/or diagnosis of SARS-CoV-2 by FDA under an Emergency Use Authorization (EUA). This EUA will remain in effect (meaning this test can be used) for the duration of the COVID-19 declaration under Section 564(b)(1) of the Act, 21 U.S.C. section 360bbb-3(b)(1), unless the authorization is terminated or revoked.  Performed at [facility], [address] [place], [place] [address]   MRSA Next Gen by PCR, Nasal     Status: None   Collection Time: 12/29/23  1:13 PM   Specimen: Nasal Mucosa; Nasal Swab  Result Value Ref Range Status   MRSA by PCR Next Gen NOT DETECTED NOT DETECTED Final    Comment: (NOTE) The GeneXpert MRSA Assay (FDA approved for NASAL specimens only), is one component of a comprehensive MRSA colonization surveillance program. It is not intended to diagnose MRSA infection nor to guide or monitor treatment for MRSA infections. Test performance is not FDA approved in patients less than 60 years old. Performed at [facility], [address] [place], [place] [address]      Scheduled Meds:  amiodarone   400 mg Oral BID   apixaban   5 mg Oral BID   atorvastatin   20 mg Oral Daily   cefdinir   300 mg Oral Q12H   Chlorhexidine  Gluconate Cloth  6 each Topical  Daily   diltiazem   60 mg Oral TID   doxycycline   100 mg Oral Q12H   feeding supplement  237 mL Oral BID BM   finasteride   5 mg Oral q AM   fluticasone  furoate-vilanterol  1 puff Inhalation Daily   melatonin  6 mg Oral QHS   multivitamin with minerals  1 tablet Oral Daily   sertraline   50 mg Oral q morning   sodium chloride  flush  3 mL Intravenous Q12H   sodium chloride  flush  3 mL Intravenous Q12H   traZODone   50 mg Oral QHS   Continuous Infusions:  Procedures/Studies: ECHOCARDIOGRAM COMPLETE Result Date: 01/03/2024    ECHOCARDIOGRAM REPORT   Patient Name:   JOCOB DAMBACH Date of Exam: 01/03/2024 Medical Rec #:  [id number]       Height:       72.0 in Accession #:    [id number]      Weight:       197.5 lb Date of Birth:  [birth year]        BSA:          2.119 m Patient Age:    82 years        BP:           133/65 mmHg Patient Gender: M               HR:           69 bpm. Exam Location:  [facility] Procedure: 2D Echo, Cardiac Doppler and Color Doppler Indications:    Atrial Fibrillation I48.91  History:        Patient has prior history of Echocardiogram examinations, most                 recent 07/27/2015. COPD and Stroke, Arrythmias:Atrial                 Fibrillation; Risk Factors:Dyslipidemia. Advanced dementia                 (HCC).  Sonographer:    Aida Pizza RCS Referring Phys: [id number] DORN FALCON BRANCH IMPRESSIONS  1. Left ventricular ejection  fraction, by estimation, is 65 to 70%. The left ventricle has normal function. The left ventricle has no regional wall motion abnormalities. There is mild left ventricular hypertrophy. Left ventricular diastolic parameters are indeterminate.  2. Right ventricular systolic function is normal. The right ventricular size is normal. There is normal pulmonary artery systolic pressure.  3. Left atrial size was mildly dilated.  4. The mitral valve is normal in structure. No evidence of mitral valve regurgitation. No evidence of mitral stenosis.  5. The tricuspid  valve is abnormal.  6. The aortic valve is tricuspid. There is moderate calcification of the aortic valve. There is moderate thickening of the aortic valve. Aortic valve regurgitation is not visualized. No aortic stenosis is present.  7. The inferior vena cava is normal in size with greater than 50% respiratory variability, suggesting right atrial pressure of 3 mmHg. FINDINGS  Left Ventricle: Left ventricular ejection fraction, by estimation, is 65 to 70%. The left ventricle has normal function. The left ventricle has no regional wall motion abnormalities. The left ventricular internal cavity size was normal in size. There is  mild left ventricular hypertrophy. Left ventricular diastolic parameters are indeterminate. Right Ventricle: The right ventricular size is normal. Right vetricular wall thickness was not well visualized. Right ventricular systolic function is normal. There is normal pulmonary artery systolic pressure. The tricuspid regurgitant velocity is 2.57 m/s, and with an assumed right atrial pressure of 3 mmHg, the estimated right ventricular systolic pressure is 29.4 mmHg. Left Atrium: Left atrial size was mildly dilated. Right Atrium: Right atrial size was normal in size. Pericardium: There is no evidence of pericardial effusion. Mitral Valve: The mitral valve is normal in structure. No evidence of mitral valve regurgitation. No evidence of mitral valve stenosis. Tricuspid Valve: The tricuspid valve is abnormal. Tricuspid valve regurgitation is mild . No evidence of tricuspid stenosis. Aortic Valve: The aortic valve is tricuspid. There is moderate calcification of the aortic valve. There is moderate thickening of the aortic valve. There is moderate aortic valve annular calcification. Aortic valve regurgitation is not visualized. No aortic stenosis is present. Aortic valve mean gradient measures 8.0 mmHg. Aortic valve peak gradient measures 15.5 mmHg. Aortic valve area, by VTI measures 1.99 cm.  Pulmonic Valve: The pulmonic valve was not well visualized. Pulmonic valve regurgitation is not visualized. No evidence of pulmonic stenosis. Aorta: The aortic root is normal in size and structure. Venous: The inferior vena cava is normal in size with greater than 50% respiratory variability, suggesting right atrial pressure of 3 mmHg. IAS/Shunts: No atrial level shunt detected by color flow Doppler.  LEFT VENTRICLE PLAX 2D LVIDd:         4.60 cm   Diastology LVIDs:         2.10 cm   LV e' medial:    7.40 cm/s LV PW:         1.10 cm   LV E/e' medial:  9.6 LV IVS:        1.10 cm   LV e' lateral:   8.05 cm/s LVOT diam:     1.80 cm   LV E/e' lateral: 8.8 LV SV:         79 LV SV Index:   37 LVOT Area:     2.54 cm  RIGHT VENTRICLE RV S prime:     20.80 cm/s TAPSE (M-mode): 2.6 cm LEFT ATRIUM             Index  RIGHT ATRIUM           Index LA diam:        2.80 cm 1.32 cm/m   RA Area:     25.20 cm LA Vol (A2C):   77.7 ml 36.66 ml/m  RA Volume:   76.70 ml  36.19 ml/m LA Vol (A4C):   84.0 ml 39.63 ml/m LA Biplane Vol: 88.6 ml 41.81 ml/m  AORTIC VALVE AV Area (Vmax):    2.08 cm AV Area (Vmean):   2.06 cm AV Area (VTI):     1.99 cm AV Vmax:           197.00 cm/s AV Vmean:          126.000 cm/s AV VTI:            0.397 m AV Peak Grad:      15.5 mmHg AV Mean Grad:      8.0 mmHg LVOT Vmax:         161.00 cm/s LVOT Vmean:        102.000 cm/s LVOT VTI:          0.310 m LVOT/AV VTI ratio: 0.78  AORTA Ao Root diam: 2.90 cm MITRAL VALVE               TRICUSPID VALVE MV Area (PHT): 2.83 cm    TR Peak grad:   26.4 mmHg MV Decel Time: 268 msec    TR Vmax:        257.00 cm/s MV E velocity: 70.70 cm/s MV A velocity: 65.20 cm/s  SHUNTS MV E/A ratio:  1.08        Systemic VTI:  0.31 m                            Systemic Diam: 1.80 cm Dorn Ross MD Electronically signed by Dorn Ross MD Signature Date/Time: 01/03/2024/12:08:52 PM    Final    DG Chest Port 1 View Result Date: 01/03/2024 CLINICAL DATA:  Dyspnea. EXAM:  PORTABLE CHEST 1 VIEW COMPARISON:  December 29, 2023. FINDINGS: Stable cardiomediastinal silhouette. Mildly improved bibasilar interstitial densities are noted suggesting improving edema or atypical inflammation. Small left pleural effusion may be present. Bony thorax is unremarkable. IMPRESSION: Improved bibasilar opacities as noted above. Electronically Signed   By: Lynwood Landy Raddle M.D.   On: 01/03/2024 10:38   CT Angio Chest PE W and/or Wo Contrast Result Date: 12/29/2023 CLINICAL DATA:  83 year old male with history of shortness of breath. Low oxygen saturation. Evaluate for pulmonary embolism. EXAM: CT ANGIOGRAPHY CHEST WITH CONTRAST TECHNIQUE: Multidetector CT imaging of the chest was performed using the standard protocol during bolus administration of intravenous contrast. Multiplanar CT image reconstructions and MIPs were obtained to evaluate the vascular anatomy. RADIATION DOSE REDUCTION: This exam was performed according to the departmental dose-optimization program which includes automated exposure control, adjustment of the mA and/or kV according to patient size and/or use of iterative reconstruction technique. CONTRAST:  60mL OMNIPAQUE  IOHEXOL  350 MG/ML SOLN COMPARISON:  Chest CT 11/02/2023. FINDINGS: Comment: Today's study is severely limited for assessment of pulmonary embolism by suboptimal contrast bolus, excessive image artifact from the patient being imaged with the arms in the down position, and extensive patient respiratory motion. Cardiovascular: Within the limitations of today's examination, there is no central or lobar sized filling defect to suggest large pulmonary embolism. Unfortunately, secondary to the limitations of today's examination, segmental and subsegmental sized filling defects can  not entirely be excluded. Heart size is normal. There is no significant pericardial fluid, thickening or pericardial calcification. There is aortic atherosclerosis, as well as atherosclerosis of the  great vessels of the mediastinum and the coronary arteries, including calcified atherosclerotic plaque in the left main, left anterior descending, left circumflex and right coronary arteries. Thickening and calcification of the aortic valve. Aneurysmal dilatation of the ascending thoracic aorta (4.7 cm in diameter), unchanged. Mediastinum/Nodes: No pathologically enlarged mediastinal or hilar lymph nodes. Esophagus is unremarkable in appearance. No axillary lymphadenopathy. Lungs/Pleura: Assessment of the lung parenchyma is substantially limited by patient respiratory motion. However, there are clearly widespread areas of thickening of the peribronchovascular interstitium with extensive peribronchovascular ground-glass attenuation and consolidative changes scattered throughout the lungs bilaterally, indicative of widespread bilateral multilobar bronchopneumonia, most severe in the right upper and right lower lobes. No pleural effusions. Upper Abdomen: Aortic atherosclerosis. Exophytic 3 cm low-attenuation lesion in the upper pole of the left kidney, incompletely characterized on today's noncontrast CT examination, but statistically likely cysts (no imaging follow-up recommended). Numerous calcified granulomas in the spleen incidentally noted. Musculoskeletal: There are no aggressive appearing lytic or blastic lesions noted in the visualized portions of the skeleton. Review of the MIP images confirms the above findings. IMPRESSION: 1. Severely limited examination demonstrating no central or lobar sized pulmonary embolism. Accurate assessment for smaller segmental and subsegmental sized emboli is not possible on the basis of today's examination. 2. Severe multilobar bilateral bronchopneumonia, as above. 3. Aortic atherosclerosis, in addition to left main and three-vessel coronary artery disease. Aneurysmal dilatation of the ascending thoracic aorta (4.7 cm in diameter), similar to the prior study. Ascending thoracic  aortic aneurysm. Recommend semi-annual imaging followup by CTA or MRA and referral to cardiothoracic surgery if not already obtained. This recommendation follows 2010 ACCF/AHA/AATS/ACR/ASA/SCA/SCAI/SIR/STS/SVM Guidelines for the Diagnosis and Management of Patients With Thoracic Aortic Disease. Circulation. 2010; 121: Z733-z630. Aortic aneurysm NOS (ICD10-I71.9). Aortic Atherosclerosis (ICD10-I70.0). Aortic aneurysm NOS (ICD10-I71.9). Electronically Signed   By: Toribio Aye M.D.   On: 12/29/2023 07:13   DG Chest Portable 1 View Result Date: 12/29/2023 CLINICAL DATA:  Shortness of breath, palpitations EXAM: PORTABLE CHEST 1 VIEW COMPARISON:  10/19/2023 FINDINGS: Heart and mediastinal contours within normal limits. Loop recorder device in place. Diffuse interstitial prominence in the lower lobes. No effusions or acute bony abnormality. IMPRESSION: Diffuse interstitial prominence in the lower lobes could reflect edema or infection. Electronically Signed   By: Franky Crease M.D.   On: 12/29/2023 03:16    Alm Schneider, DO  [facility]  If 7PM-7AM, please contact night-coverage [email or web address] Password [email or web address] 01/03/2024, 3:01 PM   LOS: 5 days

## 2024-01-03 NOTE — Plan of Care (Signed)
  Problem: Fluid Volume: Goal: Hemodynamic stability will improve Outcome: Progressing   Problem: Clinical Measurements: Goal: Diagnostic test results will improve Outcome: Progressing Goal: Signs and symptoms of infection will decrease Outcome: Progressing   Problem: Respiratory: Goal: Ability to maintain adequate ventilation will improve Outcome: Progressing   Problem: Education: Goal: Knowledge of General Education information will improve Description: Including pain rating scale, medication(s)/side effects and non-pharmacologic comfort measures Outcome: Progressing   Problem: Health Behavior/Discharge Planning: Goal: Ability to manage health-related needs will improve Outcome: Progressing   Problem: Clinical Measurements: Goal: Ability to maintain clinical measurements within normal limits will improve Outcome: Progressing Goal: Will remain free from infection Outcome: Progressing Goal: Diagnostic test results will improve Outcome: Progressing Goal: Respiratory complications will improve Outcome: Progressing Goal: Cardiovascular complication will be avoided Outcome: Progressing   Problem: Activity: Goal: Risk for activity intolerance will decrease Outcome: Not Progressing   Problem: Nutrition: Goal: Adequate nutrition will be maintained Outcome: Not Progressing   Problem: Coping: Goal: Level of anxiety will decrease Outcome: Progressing   Problem: Elimination: Goal: Will not experience complications related to bowel motility Outcome: Progressing Goal: Will not experience complications related to urinary retention Outcome: Progressing   Problem: Pain Management: Goal: General experience of comfort will improve Outcome: Progressing   Problem: Safety: Goal: Ability to remain free from injury will improve Outcome: Progressing   Problem: Skin Integrity: Goal: Risk for impaired skin integrity will decrease Outcome: Progressing   Problem: Safety: Goal:  Non-violent Restraint(s) Outcome: Progressing   Problem: Education: Goal: Knowledge of disease or condition will improve Outcome: Not Progressing Goal: Knowledge of the prescribed therapeutic regimen will improve Outcome: Not Progressing Goal: Individualized Educational Video(s) Outcome: Not Progressing   Problem: Activity: Goal: Ability to tolerate increased activity will improve Outcome: Not Progressing Goal: Will verbalize the importance of balancing activity with adequate rest periods Outcome: Not Progressing   Problem: Respiratory: Goal: Ability to maintain a clear airway will improve Outcome: Progressing Goal: Levels of oxygenation will improve Outcome: Progressing Goal: Ability to maintain adequate ventilation will improve Outcome: Progressing   Problem: Education: Goal: Knowledge of disease and its progression will improve Outcome: Not Progressing Goal: Individualized Educational Video(s) Outcome: Not Progressing   Problem: Fluid Volume: Goal: Compliance with measures to maintain balanced fluid volume will improve Outcome: Progressing   Problem: Health Behavior/Discharge Planning: Goal: Ability to manage health-related needs will improve Outcome: Progressing

## 2024-01-03 NOTE — Progress Notes (Signed)
 Rounding Note    Patient Name: Kyle Owen Date of Encounter: 01/03/2024   [facility] Cardiologist: Oneil Parchment, MD   Subjective   No acute events overnight  Inpatient Medications    Scheduled Meds:  apixaban   5 mg Oral BID   atorvastatin   20 mg Oral Daily   cefdinir   300 mg Oral Q12H   Chlorhexidine  Gluconate Cloth  6 each Topical Daily   diltiazem   60 mg Oral TID   doxycycline   100 mg Oral Q12H   feeding supplement  237 mL Oral BID BM   finasteride   5 mg Oral q AM   fluticasone  furoate-vilanterol  1 puff Inhalation Daily   melatonin  6 mg Oral QHS   multivitamin with minerals  1 tablet Oral Daily   sertraline   50 mg Oral q morning   sodium chloride  flush  3 mL Intravenous Q12H   sodium chloride  flush  3 mL Intravenous Q12H   traZODone   50 mg Oral QHS   Continuous Infusions:  amiodarone  30 mg/hr (01/03/24 0613)   PRN Meds: acetaminophen  **OR** acetaminophen , ALPRAZolam , bisacodyl , haloperidol  lactate, hydrALAZINE , HYDROmorphone  (DILAUDID ) injection, ipratropium, levalbuterol , ondansetron  **OR** ondansetron  (ZOFRAN ) IV, mouth rinse, oxyCODONE , senna-docusate, sodium phosphate    Vital Signs    Vitals:   01/03/24 0500 01/03/24 0600 01/03/24 0700 01/03/24 0759  BP: (!) 119/55 121/88 137/76   Pulse: (!) 120 (!) 124 70   Resp: 16 (!) 24 15   Temp:    (!) 97.4 F (36.3 C)  TempSrc:    Oral  SpO2: 95% 94% 92%   Weight:      Height:        Intake/Output Summary (Last 24 hours) at 01/03/2024 0827 Last data filed at 01/03/2024 0300 Gross per 24 hour  Intake --  Output 1450 ml  Net -1450 ml      01/01/2024    5:00 AM 12/31/2023    4:00 AM 12/30/2023    5:00 AM  Last 3 Weights  Weight (lbs) 197 lb 8.5 oz 196 lb 3.4 oz 190 lb 7.6 oz  Weight (kg) 89.6 kg 89 kg 86.4 kg      Telemetry    SR - Personally Reviewed  ECG    N/a - Personally Reviewed  Physical Exam   GEN: No acute distress.   Neck: No JVD Cardiac: RRR, no murmurs, rubs, or gallops.   Respiratory: Coarse bilateral bases GI: Soft, nontender, non-distended  MS: No edema; No deformity. Neuro:  Nonfocal  Psych: Normal affect   Labs    High Sensitivity Troponin:  No results for input(s): TROPONINIHS in the last 720 hours.   Chemistry Recent Labs  Lab 12/29/23 0215 12/30/23 0554 12/31/23 0435 01/01/24 0507 01/02/24 0456 01/03/24 0448  NA 137 140 141 137 136 137  K 3.7 4.1 4.1 4.1 3.5 3.6  CL 102 104 104 103 100 100  CO2 26 27 24 29 29 31   GLUCOSE 156* 174* 182* 164* 126* 117*  BUN 32* 31* 40* 45* 41* 38*  CREATININE 1.66* 1.32* 1.50* 1.55* 1.61* 1.49*  CALCIUM  8.9 8.8* 9.1 8.4* 8.2* 8.1*  MG 2.3  --   --   --  2.0 2.0  PROT 6.8 6.1* 6.7 5.9*  --   --   ALBUMIN 3.0* 2.7* 3.0* 2.5*  --   --   AST 31 31 39 39  --   --   ALT 20 21 26 30   --   --   CICERO  68 60 70 64  --   --   BILITOT 1.2 0.8 0.5 0.5  --   --   GFRNONAA 41* 54* 46* 44* 42* 46*  ANIONGAP 9 9 13 5 7 6     Lipids No results for input(s): CHOL, TRIG, HDL, LABVLDL, LDLCALC, CHOLHDL in the last 168 hours.  Hematology Recent Labs  Lab 01/01/24 0507 01/02/24 0438 01/03/24 0448  WBC 14.0* 9.8 10.1  RBC 3.76* 4.01* 4.05*  HGB 11.8* 12.3* 12.6*  HCT 37.0* 39.3 39.8  MCV 98.4 98.0 98.3  MCH 31.4 30.7 31.1  MCHC 31.9 31.3 31.7  RDW 13.1 13.1 13.1  PLT 352 365 351   Thyroid   Recent Labs  Lab 12/29/23 0215  TSH 0.835    BNP Recent Labs  Lab 12/29/23 0215 01/01/24 0507  BNP 585.0* 635.0*    DDimer No results for input(s): DDIMER in the last 168 hours.   Radiology    No results found.  Cardiac Studies    Patient Profile       Assessment & Plan    1.PAF - EKG 08/2022 SR, 09/2023 afib and this admit afib. Probably persistent afib - presented with afib with RVR in setting of sepsis - was started on dilt gtt for rate control - he is on eliquis  for stroke prevention   - high heart rates and low bp's on dilt gtt at 15. We discontinued dilt gtt, started IV amio  12/30/22 -  overall short course of amio given lung disease. When sepsis resolves afib drive should also decrease. Would stop amio at outpatient f/u   -remains on amio 30mg /hr. DIltiazem  60mg  tid - dementia, DNR status, chronic 4L O2 dependence. Wuuld be hesitant to consider DCCV - he converted to SR early this AM. D/c IV amio, start oral amio 400mg  bid x 1 week, then 200mg  bid x 2 weeks, then 200mg  daily. WOuld plan to d/c amio in 3-4 weeks at outpatient f/u.    2. Sepsis/multifocal pneumonia - per primary team     3. COPD  - on home O2 4L, exacerbation in setting of pneumonia   4. Advanced dementia   5. HFpEF - update echo now that rates are controlled - repeat CXR, difficult to assess volume status by exam. Consider diuretic based on xray.     For questions or updates, please contact [facility] Please consult [email or web address] for contact info under        Signed, Alvan Carrier, MD  01/03/2024, 8:27 AM

## 2024-01-03 NOTE — Progress Notes (Signed)
*  PRELIMINARY RESULTS* Echocardiogram 2D Echocardiogram has been performed.  Stacey Drain 01/03/2024, 11:46 AM

## 2024-01-04 DIAGNOSIS — J9621 Acute and chronic respiratory failure with hypoxia: Secondary | ICD-10-CM

## 2024-01-04 DIAGNOSIS — I4891 Unspecified atrial fibrillation: Secondary | ICD-10-CM | POA: Diagnosis not present

## 2024-01-04 DIAGNOSIS — J9622 Acute and chronic respiratory failure with hypercapnia: Secondary | ICD-10-CM

## 2024-01-04 DIAGNOSIS — J181 Lobar pneumonia, unspecified organism: Secondary | ICD-10-CM | POA: Diagnosis not present

## 2024-01-04 DIAGNOSIS — J441 Chronic obstructive pulmonary disease with (acute) exacerbation: Secondary | ICD-10-CM | POA: Diagnosis not present

## 2024-01-04 LAB — BLOOD GAS, ARTERIAL
Acid-Base Excess: 10.6 mmol/L — ABNORMAL HIGH (ref 0.0–2.0)
Bicarbonate: 36.3 mmol/L — ABNORMAL HIGH (ref 20.0–28.0)
Drawn by: 4438
O2 Saturation: 91.9 %
Patient temperature: 36.7
pCO2 arterial: 50 mm[Hg] — ABNORMAL HIGH (ref 32–48)
pH, Arterial: 7.46 — ABNORMAL HIGH (ref 7.35–7.45)
pO2, Arterial: 59 mm[Hg] — ABNORMAL LOW (ref 83–108)

## 2024-01-04 LAB — BASIC METABOLIC PANEL
Anion gap: 7 (ref 5–15)
BUN: 36 mg/dL — ABNORMAL HIGH (ref 8–23)
CO2: 31 mmol/L (ref 22–32)
Calcium: 8.4 mg/dL — ABNORMAL LOW (ref 8.9–10.3)
Chloride: 99 mmol/L (ref 98–111)
Creatinine, Ser: 1.53 mg/dL — ABNORMAL HIGH (ref 0.61–1.24)
GFR, Estimated: 45 mL/min — ABNORMAL LOW (ref >60)
Glucose, Bld: 111 mg/dL — ABNORMAL HIGH (ref 70–99)
Potassium: 4.2 mmol/L (ref 3.5–5.1)
Sodium: 137 mmol/L (ref 135–145)

## 2024-01-04 LAB — MAGNESIUM: Magnesium: 2.1 mg/dL (ref 1.7–2.4)

## 2024-01-04 LAB — GLUCOSE, CAPILLARY: Glucose-Capillary: 96 mg/dL (ref 70–99)

## 2024-01-04 MED ORDER — FUROSEMIDE 10 MG/ML IJ SOLN
40.0000 mg | Freq: Once | INTRAMUSCULAR | Status: AC
  Administered 2024-01-04: 40 mg via INTRAVENOUS
  Filled 2024-01-04: qty 4

## 2024-01-04 MED ORDER — DILTIAZEM HCL ER COATED BEADS 180 MG PO CP24
180.0000 mg | ORAL_CAPSULE | Freq: Every day | ORAL | Status: DC
  Administered 2024-01-04 – 2024-01-07 (×4): 180 mg via ORAL
  Filled 2024-01-04 (×4): qty 1

## 2024-01-04 NOTE — Progress Notes (Signed)
 Occupational Therapy Treatment Patient Details Name: Kyle Owen MRN: [id number] DOB: May 08, 1941 Today's Date: 01/04/2024   History of present illness Kyle Owen is a-year-old male with extensive history of advanced dementia, chronic A-fib on Eliquis , BPH, prostate cancer, COPD to dependent 4 L, depression, TIAs, HLD, HTN, OA, DJD, old left pulmonary nodule, chronic thoracic aneurysm.... Presenting today with progressive shortness of breath, hypoxia on 2-4 L satting 88%... Improved to 90% on 6 L, also complaining of palpitation and dizziness.   OT comments  Pt agreeable to OT session. Pt daughter in room at start of session. Pt able to complete bed mobility with mod assist and maintain balance seated EOB with hands supported. Pt completed sit to stand t/f with mod assist and use of RW. HE was able to complete functional mobility around room with CGA and VC to keep RW close to body. Pt completed UE exercises while seated in chair to improve UE strength. D/c plans remain appropriate.       If plan is discharge home, recommend the following:  A lot of help with walking and/or transfers;A lot of help with bathing/dressing/bathroom;Assistance with cooking/housework;Direct supervision/assist for medications management;Assist for transportation;Help with stairs or ramp for entrance   Equipment Recommendations  None recommended by OT       Precautions / Restrictions Precautions Precautions: Fall Restrictions Weight Bearing Restrictions Per Provider Order: No       Mobility Bed Mobility Overal bed mobility: Needs Assistance Bed Mobility: Supine to Sit     Supine to sit: Min assist, HOB elevated     General bed mobility comments: increased time, labored movement    Transfers Overall transfer level: Needs assistance Equipment used: Rolling walker (2 wheels) Transfers: Bed to chair/wheelchair/BSC, Sit to/from Stand Sit to Stand: Mod assist     Step pivot transfers: Mod  assist     General transfer comment: unsteady labored movement     Balance Overall balance assessment: Needs assistance Sitting-balance support: Feet supported, No upper extremity supported Sitting balance-Leahy Scale: Good Sitting balance - Comments: good seated EOB   Standing balance support: Reliant on assistive device for balance, During functional activity, Bilateral upper extremity supported Standing balance-Leahy Scale: Poor Standing balance comment: using RW                             Extremity/Trunk Assessment Upper Extremity Assessment Upper Extremity Assessment: Overall WFL for tasks assessed            Vision   Vision Assessment?: No apparent visual deficits   Perception Perception Perception: Not tested   Praxis Praxis Praxis: Not tested    Cognition Arousal: Alert Behavior During Therapy: WFL for tasks assessed/performed Overall Cognitive Status: History of cognitive impairments - at baseline                                          Exercises Exercises: General Upper Extremity General Exercises - Upper Extremity Shoulder Flexion: AROM, 10 reps, Both, Seated Shoulder Horizontal ABduction: AROM, Both, 10 reps, Seated Elbow Flexion: AROM, Both, 10 reps, Seated Elbow Extension: AROM, 10 reps, Seated, Both            Pertinent Vitals/ Pain       Pain Assessment Pain Assessment: No/denies pain         Frequency  Min 2X/week  Progress Toward Goals  OT Goals(current goals can now be found in the care plan section)  Progress towards OT goals: Progressing toward goals  Acute Rehab OT Goals OT Goal Formulation: With patient/family Time For Goal Achievement: 01/16/24 Potential to Achieve Goals: Good ADL Goals Pt Will Perform Grooming: with modified independence;sitting Pt Will Perform Upper Body Dressing: with modified independence Pt Will Perform Lower Body Dressing: with modified independence Pt  Will Transfer to Toilet: with modified independence Pt Will Perform Toileting - Clothing Manipulation and hygiene: with modified independence Pt/caregiver will Perform Home Exercise Program: Increased ROM;Increased strength;Independently;Both right and left upper extremity  Plan      Co-evaluation    PT/OT/SLP Co-Evaluation/Treatment: Yes Reason for Co-Treatment: To address functional/ADL transfers   OT goals addressed during session: Strengthening/ROM         End of Session Equipment Utilized During Treatment: Rolling walker (2 wheels);Oxygen  OT Visit Diagnosis: Unsteadiness on feet (R26.81);Other abnormalities of gait and mobility (R26.89);Muscle weakness (generalized) (M62.81);History of falling (Z91.81)   Activity Tolerance Patient tolerated treatment well   Patient Left in chair;with call bell/phone within reach;with family/visitor present   Nurse Communication Mobility status        Time: 1140-1201 OT Time Calculation (min): 21 min  Charges: OT General Charges $OT Visit: 1 Visit OT Treatments $Therapeutic Exercise: 8-22 mins   [name], OTR/L  01/04/2024, 12:29 PM

## 2024-01-04 NOTE — Progress Notes (Signed)
 Physical Therapy Treatment Patient Details Name: Kyle Owen MRN: [id number] DOB: 18-Aug-1941 Today's Date: 01/04/2024   History of Present Illness Kyle Owen is a-year-old male with extensive history of advanced dementia, chronic A-fib on Eliquis , BPH, prostate cancer, COPD to dependent 4 L, depression, TIAs, HLD, HTN, OA, DJD, old left pulmonary nodule, chronic thoracic aneurysm.... Presenting today with progressive shortness of breath, hypoxia on 2-4 L satting 88%... Improved to 90% on 6 L, also complaining of palpitation and dizziness.    PT Comments  Patient agreeable for therapy.  Patient has most difficulty scooting EOB during bed mobility, demonstrates unsteady labored movement during sit to stands, requires verbal/tactile cueing for safety due to pushing RW to far in front during gait training, demonstrated improvement, but limited mostly due to fatigue.  Patient tolerated sitting up in chair after therapy while working with OT.  Patient will benefit from continued skilled physical therapy in hospital and recommended venue below to increase strength, balance, endurance for safe ADLs and gait.      If plan is discharge home, recommend the following: A lot of help with bathing/dressing/bathroom;A lot of help with walking and/or transfers;Help with stairs or ramp for entrance;Assistance with cooking/housework   Can travel by private vehicle     No  Equipment Recommendations  None recommended by PT    Recommendations for Other Services       Precautions / Restrictions Precautions Precautions: Fall Restrictions Weight Bearing Restrictions Per Provider Order: No     Mobility  Bed Mobility Overal bed mobility: Needs Assistance Bed Mobility: Supine to Sit     Supine to sit: Min assist     General bed mobility comments: has most difficulty scooting to EOB    Transfers Overall transfer level: Needs assistance Equipment used: Rolling walker (2 wheels) Transfers: Bed  to chair/wheelchair/BSC, Sit to/from Stand Sit to Stand: Mod assist   Step pivot transfers: Mod assist       General transfer comment: unsteady labored movement requiring verbal/tactile cueing for proper use of RW    Ambulation/Gait Ambulation/Gait assistance: Mod assist Gait Distance (Feet): 20 Feet Assistive device: Rolling walker (2 wheels) Gait Pattern/deviations: Decreased step length - right, Decreased step length - left, Decreased stride length, Trunk flexed Gait velocity: decreased     General Gait Details: slow labored cadence with tendency to push RW to far in front requiring verbal/tactile cueing for safety with improvement noted, limited mostly due to fatigue, on 4 LPM O2   Stairs             Wheelchair Mobility     Tilt Bed    Modified Rankin (Stroke Patients Only)       Balance Overall balance assessment: Needs assistance Sitting-balance support: Feet supported, No upper extremity supported Sitting balance-Leahy Scale: Good Sitting balance - Comments: seated at EOB   Standing balance support: Reliant on assistive device for balance, During functional activity, Bilateral upper extremity supported Standing balance-Leahy Scale: Poor Standing balance comment: fair/poor using RW                            Cognition Arousal: Alert Behavior During Therapy: WFL for tasks assessed/performed Overall Cognitive Status: History of cognitive impairments - at baseline  Exercises General Exercises - Lower Extremity Long Arc Quad: Seated, AROM, Strengthening, Both, 10 reps Hip Flexion/Marching: Seated, AROM, Strengthening, Both, 10 reps Toe Raises: Seated, AROM, Strengthening, Both, 10 reps Heel Raises: Seated, AROM, Strengthening, Both, 10 reps    General Comments        Pertinent Vitals/Pain Pain Assessment Pain Assessment: No/denies pain    Home Living                           Prior Function            PT Goals (current goals can now be found in the care plan section) Acute Rehab PT Goals Patient Stated Goal: return home PT Goal Formulation: With patient/family Time For Goal Achievement: 01/16/24 Potential to Achieve Goals: Good Progress towards PT goals: Progressing toward goals    Frequency    Min 3X/week      PT Plan      Co-evaluation PT/OT/SLP Co-Evaluation/Treatment: Yes Reason for Co-Treatment: To address functional/ADL transfers PT goals addressed during session: Mobility/safety with mobility;Balance;Proper use of DME OT goals addressed during session: Strengthening/ROM      AM-PAC PT 6 Clicks Mobility   Outcome Measure  Help needed turning from your back to your side while in a flat bed without using bedrails?: A Little Help needed moving from lying on your back to sitting on the side of a flat bed without using bedrails?: A Little Help needed moving to and from a bed to a chair (including a wheelchair)?: A Little Help needed standing up from a chair using your arms (e.g., wheelchair or bedside chair)?: A Lot Help needed to walk in hospital room?: A Lot Help needed climbing 3-5 steps with a railing? : A Lot 6 Click Score: 15    End of Session Equipment Utilized During Treatment: Oxygen Activity Tolerance: Patient tolerated treatment well;Patient limited by fatigue Patient left: in chair;with call bell/phone within reach;with chair alarm set Nurse Communication: Mobility status PT Visit Diagnosis: Unsteadiness on feet (R26.81);Other abnormalities of gait and mobility (R26.89);Muscle weakness (generalized) (M62.81)     Time: 1140-1200 PT Time Calculation (min) (ACUTE ONLY): 20 min  Charges:    $Therapeutic Exercise: 8-22 mins $Therapeutic Activity: 8-22 mins PT General Charges $$ ACUTE PT VISIT: 1 Visit                     3:01 PM, 01/04/24 Lynwood Music, MPT Physical Therapist with [facility] [phone] office [phone] mobile phone

## 2024-01-04 NOTE — Progress Notes (Signed)
 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 Presenting today with progressive shortness of breath, hypoxia on 2-4 L satting 88%... Improved to 90% on 6 L, also complaining of palpitation and dizziness.   ED evaluation/course: Vitals:   12/29/23 0645 12/29/23 0703  BP: 135/81 118/66  Pulse: (!) 123 (!) 123  Resp: (!) 36 (!) 22  Temp:    SpO2: 94% 91%  Currently on 6 L of oxygen Labs: Glucose 156, BUN 32, creatinine 1.66, albumin 3.0, BNP 585.0, WBC 10.2, CT angiogram multiple artifact negative for any obvious pulmonary embolism, multilobar pneumonia , 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 Chest x-ray: Diffuse superficial prominence in the lower lobe could reflect edema or infection  Patient started on Cardizem , IV fluids, IV antibiotics of azithromycin  and Rocephin  The patient's hospitalization has been prolonged secondary to atrial fibrillation with RVR and soft blood pressures which have made his heart rates difficult to control Cardiology was consulted this is management Patient was started on amiodarone , and it was rebolused. P.o. diltiazem  was reintroduced to help with heart rates.   Assessment/Plan:  Sepsis -Present on admission -Secondary to pneumonia -12/29/2023 CTA chest negative PE, positive multilobar pneumonia, 4.7 cm ascending thoracic aneurysm -Patient was started on ceftriaxone  and azithromycin  -Sepsis physiology overall has improved   Lobar pneumonia -PCT 0.45 -MRSA screen negative -Transition to cefdinir  and doxycycline  x 2 more days -COVID-19 PCR negative -Blood cultures negative   Acute on chronic respiratory failure with hypoxia and  hypercarbia -Initially on 6 L -Secondary to pneumonia and COPD exacerbation -Previously on 4 L nasal cannula at home -Currently stable on 2L>>4L -Continue bronchodilators -finished steroid burst -no further wheezing -1/9--personally reviewed CXR--mild increased interstitial markings, improving bibasilar opacities -aim for oxygen saturation >90% -01/03/23 ABG--7.46/50/59/36   Paroxysmal atrial fibrillation -Appreciate cardiology -1/9--converted to sinus early am>>remains in sinus -Continue IV amiodarone >>po amio on 1/9 -Continue diltiazem >>consolidate to long acting -Continue apixaban  -01/03/24 echo--EF 65-70%, no WMA, normal RVF, mild TR   Acute heart failure with preserved EF -Patient was dosed with IV furosemide  01/01/2024 -net neg 3.2L -01/03/24 echo--EF 65-70%, no WMA, normal RVF, mild TR -redose IV lasix  1/10 x 1 with increase oxygen demand and increase crackles on exam   CKD stage IIIb -Baseline creatinine 1.5-1.8   Mixed hyperlipidemia -Continue statin   Thoracic ascending aortic aneurysm (HCC) -Chronic history of thoracic ascending aortic aneurysm -Based on today's CTA 4.7 cm no changes -Follow-up as an outpatient and monitor closely   Major neurocognitive disorder -At risk for hospital delirium                   Family Communication:  no  Family at bedside   Consultants:  cardiology   Code Status: DNR   DVT Prophylaxis:  apixaban      Procedures: As Listed in Progress Note Above   Antibiotics: Ceftriaxone  1/4>>1/7 Azithro 1/4>>1/6 Cefdinir  1/8>> Doxy 1/8>>      Subjective: Patient denies fevers, chills, headache, chest pain, dyspnea, nausea, vomiting, diarrhea, abdominal pain   Objective: Vitals:   01/04/24 1345 01/04/24 1400 01/04/24 1431 01/04/24 1643  BP: (!) 103/45     Pulse: 89     Resp:  Temp:      TempSrc:      SpO2: (!) 83% (!) 89% 91% 90%  Weight:      Height:        Intake/Output Summary (Last 24 hours) at 01/04/2024  1716 Last data filed at 01/04/2024 0957 Gross per 24 hour  Intake 972.04 ml  Output 1600 ml  Net -627.96 ml   Weight change:  Exam:  General:  Pt is alert, follows commands appropriately, not in acute distress HEENT: No icterus, No thrush, No neck mass, /AT Cardiovascular: RRR, S1/S2, no rubs, no gallops Respiratory: bilateral crackles. No wheeze Abdomen: Soft/+BS, non tender, non distended, no guarding Extremities:  trace LE edema, No lymphangitis, No petechiae, No rashes, no synovitis   Data Reviewed: I have personally reviewed following labs and imaging studies Basic Metabolic Panel: Recent Labs  Lab 12/29/23 0215 12/30/23 0554 12/31/23 0435 01/01/24 0507 01/02/24 0456 01/03/24 0448 01/04/24 0442  NA 137   < > 141 137 136 137 137  K 3.7   < > 4.1 4.1 3.5 3.6 4.2  CL 102   < > 104 103 100 100 99  CO2 26   < > 24 29 29 31 31   GLUCOSE 156*   < > 182* 164* 126* 117* 111*  BUN 32*   < > 40* 45* 41* 38* 36*  CREATININE 1.66*   < > 1.50* 1.55* 1.61* 1.49* 1.53*  CALCIUM  8.9   < > 9.1 8.4* 8.2* 8.1* 8.4*  MG 2.3  --   --   --  2.0 2.0 2.1  PHOS 3.2  --   --   --   --   --   --    < > = values in this interval not displayed.   Liver Function Tests: Recent Labs  Lab 12/29/23 0215 12/30/23 0554 12/31/23 0435 01/01/24 0507  AST 31 31 39 39  ALT 20 21 26 30   ALKPHOS 68 60 70 64  BILITOT 1.2 0.8 0.5 0.5  PROT 6.8 6.1* 6.7 5.9*  ALBUMIN 3.0* 2.7* 3.0* 2.5*   No results for input(s): LIPASE, AMYLASE in the last 168 hours. No results for input(s): AMMONIA in the last 168 hours. Coagulation Profile: Recent Labs  Lab 12/29/23 0215  INR 2.0*   CBC: Recent Labs  Lab 12/29/23 0215 12/30/23 0554 12/31/23 0435 01/01/24 0507 01/02/24 0438 01/03/24 0448  WBC 10.2 8.0 13.8* 14.0* 9.8 10.1  NEUTROABS 8.4*  --   --   --   --   --   HGB 12.6* 11.0* 12.2* 11.8* 12.3* 12.6*  HCT 40.1 35.6* 39.0 37.0* 39.3 39.8  MCV 97.8 98.9 98.5 98.4 98.0 98.3  PLT 267 258 334  352 365 351   Cardiac Enzymes: No results for input(s): CKTOTAL, CKMB, CKMBINDEX, TROPONINI in the last 168 hours. BNP: Invalid input(s): POCBNP CBG: Recent Labs  Lab 12/30/23 1602 12/31/23 0808 01/02/24 0733 01/03/24 0744 01/04/24 0814  GLUCAP 151* 179* 107* 93 96   HbA1C: No results for input(s): HGBA1C in the last 72 hours. Urine analysis:    Component Value Date/Time   COLORURINE YELLOW 06/13/2016 1120   APPEARANCEUR Clear 12/04/2023 1539   LABSPEC 1.020 06/13/2016 1120   PHURINE 5.5 06/13/2016 1120   GLUCOSEU Negative 12/04/2023 1539   HGBUR NEGATIVE 06/13/2016 1120   BILIRUBINUR Negative 12/04/2023 1539   KETONESUR NEGATIVE 06/13/2016 1120   PROTEINUR Negative 12/04/2023 1539   PROTEINUR NEGATIVE 06/13/2016 1120   UROBILINOGEN negative (A) 05/04/2020 0930  NITRITE Negative 12/04/2023 1539   NITRITE NEGATIVE 06/13/2016 1120   LEUKOCYTESUR Trace (A) 12/04/2023 1539   Sepsis Labs: @LABRCNTIP (procalcitonin:4,lacticidven:4) ) Recent Results (from the past 240 hours)  Culture, blood (single)     Status: None   Collection Time: 12/29/23  7:48 AM   Specimen: Left Antecubital; Blood  Result Value Ref Range Status   Specimen Description   Final    LEFT ANTECUBITAL BOTTLES DRAWN AEROBIC AND ANAEROBIC   Special Requests   Final    Blood Culture results may not be optimal due to an inadequate volume of blood received in culture bottles   Culture   Final    NO GROWTH 5 DAYS Performed at [facility], [address], [place], [place] [address]    Report Status 01/03/2024 FINAL  Final  Culture, blood (x 2)     Status: None   Collection Time: 12/29/23  8:10 AM   Specimen: BLOOD LEFT HAND  Result Value Ref Range Status   Specimen Description   Final    BLOOD LEFT HAND BOTTLES DRAWN AEROBIC AND ANAEROBIC   Special Requests Blood Culture adequate volume  Final   Culture   Final    NO GROWTH 5 DAYS Performed at [facility], [address] [place],  [place] [address]    Report Status 01/03/2024 FINAL  Final  Resp panel by RT-PCR (RSV, Flu A&B, Covid) Anterior Nasal Swab     Status: None   Collection Time: 12/29/23  8:22 AM   Specimen: Anterior Nasal Swab  Result Value Ref Range Status   SARS Coronavirus 2 by RT PCR NEGATIVE NEGATIVE Final    Comment: (NOTE) SARS-CoV-2 target nucleic acids are NOT DETECTED.  The SARS-CoV-2 RNA is generally detectable in upper respiratory specimens during the acute phase of infection. The lowest concentration of SARS-CoV-2 viral copies this assay can detect is 138 copies/mL. A negative result does not preclude SARS-Cov-2 infection and should not be used as the sole basis for treatment or other patient management decisions. A negative result may occur with  improper specimen collection/handling, submission of specimen other than nasopharyngeal swab, presence of viral mutation(s) within the areas targeted by this assay, and inadequate number of viral copies(<138 copies/mL). A negative result must be combined with clinical observations, patient history, and epidemiological information. The expected result is Negative.  Fact Sheet for Patients:  bloggercourse.com  Fact Sheet for Healthcare Providers:  seriousbroker.it  This test is no t yet approved or cleared by the [place]  FDA and  has been authorized for detection and/or diagnosis of SARS-CoV-2 by FDA under an Emergency Use Authorization (EUA). This EUA will remain  in effect (meaning this test can be used) for the duration of the COVID-19 declaration under Section 564(b)(1) of the Act, 21 U.S.C.section 360bbb-3(b)(1), unless the authorization is terminated  or revoked sooner.       Influenza A by PCR NEGATIVE NEGATIVE Final   Influenza B by PCR NEGATIVE NEGATIVE Final    Comment: (NOTE) The Xpert Xpress SARS-CoV-2/FLU/RSV plus assay is intended as an aid in the diagnosis of influenza from  Nasopharyngeal swab specimens and should not be used as a sole basis for treatment. Nasal washings and aspirates are unacceptable for Xpert Xpress SARS-CoV-2/FLU/RSV testing.  Fact Sheet for Patients: bloggercourse.com  Fact Sheet for Healthcare Providers: seriousbroker.it  This test is not yet approved or cleared by the [place]  FDA and has been authorized for detection and/or diagnosis of SARS-CoV-2 by FDA under an Emergency  Use Authorization (EUA). This EUA will remain in effect (meaning this test can be used) for the duration of the COVID-19 declaration under Section 564(b)(1) of the Act, 21 U.S.C. section 360bbb-3(b)(1), unless the authorization is terminated or revoked.     Resp Syncytial Virus by PCR NEGATIVE NEGATIVE Final    Comment: (NOTE) Fact Sheet for Patients: bloggercourse.com  Fact Sheet for Healthcare Providers: seriousbroker.it  This test is not yet approved or cleared by the [place]  FDA and has been authorized for detection and/or diagnosis of SARS-CoV-2 by FDA under an Emergency Use Authorization (EUA). This EUA will remain in effect (meaning this test can be used) for the duration of the COVID-19 declaration under Section 564(b)(1) of the Act, 21 U.S.C. section 360bbb-3(b)(1), unless the authorization is terminated or revoked.  Performed at [facility], [address] [place], [place] [address]   MRSA Next Gen by PCR, Nasal     Status: None   Collection Time: 12/29/23  1:13 PM   Specimen: Nasal Mucosa; Nasal Swab  Result Value Ref Range Status   MRSA by PCR Next Gen NOT DETECTED NOT DETECTED Final    Comment: (NOTE) The GeneXpert MRSA Assay (FDA approved for NASAL specimens only), is one component of a comprehensive MRSA colonization surveillance program. It is not intended to diagnose MRSA infection nor to guide or monitor treatment for  MRSA infections. Test performance is not FDA approved in patients less than 62 years old. Performed at [facility], [address] [place], [place] [address]      Scheduled Meds:  amiodarone   400 mg Oral BID   apixaban   5 mg Oral BID   atorvastatin   20 mg Oral Daily   cefdinir   300 mg Oral Q12H   Chlorhexidine  Gluconate Cloth  6 each Topical Daily   diltiazem   180 mg Oral Daily   doxycycline   100 mg Oral Q12H   feeding supplement  237 mL Oral BID BM   finasteride   5 mg Oral q AM   fluticasone  furoate-vilanterol  1 puff Inhalation Daily   melatonin  6 mg Oral QHS   multivitamin with minerals  1 tablet Oral Daily   sertraline   50 mg Oral q morning   sodium chloride  flush  3 mL Intravenous Q12H   sodium chloride  flush  3 mL Intravenous Q12H   traZODone   50 mg Oral QHS   Continuous Infusions:  Procedures/Studies: ECHOCARDIOGRAM COMPLETE Result Date: 01/03/2024    ECHOCARDIOGRAM REPORT   Patient Name:   MORRIE DAYWALT Date of Exam: 01/03/2024 Medical Rec #:  [id number]       Height:       72.0 in Accession #:    [id number]      Weight:       197.5 lb Date of Birth:  02-14-1941        BSA:          2.119 m Patient Age:    82 years        BP:           133/65 mmHg Patient Gender: M               HR:           69 bpm. Exam Location:  [facility] Procedure: 2D Echo, Cardiac Doppler and Color Doppler Indications:    Atrial Fibrillation I48.91  History:        Patient has prior history of Echocardiogram examinations, most  recent 07/27/2015. COPD and Stroke, Arrythmias:Atrial                 Fibrillation; Risk Factors:Dyslipidemia. Advanced dementia                 (HCC).  Sonographer:    Aida Pizza RCS Referring Phys: [id number] DORN FALCON BRANCH IMPRESSIONS  1. Left ventricular ejection fraction, by estimation, is 65 to 70%. The left ventricle has normal function. The left ventricle has no regional wall motion abnormalities. There is mild left ventricular hypertrophy. Left ventricular  diastolic parameters are indeterminate.  2. Right ventricular systolic function is normal. The right ventricular size is normal. There is normal pulmonary artery systolic pressure.  3. Left atrial size was mildly dilated.  4. The mitral valve is normal in structure. No evidence of mitral valve regurgitation. No evidence of mitral stenosis.  5. The tricuspid valve is abnormal.  6. The aortic valve is tricuspid. There is moderate calcification of the aortic valve. There is moderate thickening of the aortic valve. Aortic valve regurgitation is not visualized. No aortic stenosis is present.  7. The inferior vena cava is normal in size with greater than 50% respiratory variability, suggesting right atrial pressure of 3 mmHg. FINDINGS  Left Ventricle: Left ventricular ejection fraction, by estimation, is 65 to 70%. The left ventricle has normal function. The left ventricle has no regional wall motion abnormalities. The left ventricular internal cavity size was normal in size. There is  mild left ventricular hypertrophy. Left ventricular diastolic parameters are indeterminate. Right Ventricle: The right ventricular size is normal. Right vetricular wall thickness was not well visualized. Right ventricular systolic function is normal. There is normal pulmonary artery systolic pressure. The tricuspid regurgitant velocity is 2.57 m/s, and with an assumed right atrial pressure of 3 mmHg, the estimated right ventricular systolic pressure is 29.4 mmHg. Left Atrium: Left atrial size was mildly dilated. Right Atrium: Right atrial size was normal in size. Pericardium: There is no evidence of pericardial effusion. Mitral Valve: The mitral valve is normal in structure. No evidence of mitral valve regurgitation. No evidence of mitral valve stenosis. Tricuspid Valve: The tricuspid valve is abnormal. Tricuspid valve regurgitation is mild . No evidence of tricuspid stenosis. Aortic Valve: The aortic valve is tricuspid. There is moderate  calcification of the aortic valve. There is moderate thickening of the aortic valve. There is moderate aortic valve annular calcification. Aortic valve regurgitation is not visualized. No aortic stenosis is present. Aortic valve mean gradient measures 8.0 mmHg. Aortic valve peak gradient measures 15.5 mmHg. Aortic valve area, by VTI measures 1.99 cm. Pulmonic Valve: The pulmonic valve was not well visualized. Pulmonic valve regurgitation is not visualized. No evidence of pulmonic stenosis. Aorta: The aortic root is normal in size and structure. Venous: The inferior vena cava is normal in size with greater than 50% respiratory variability, suggesting right atrial pressure of 3 mmHg. IAS/Shunts: No atrial level shunt detected by color flow Doppler.  LEFT VENTRICLE PLAX 2D LVIDd:         4.60 cm   Diastology LVIDs:         2.10 cm   LV e' medial:    7.40 cm/s LV PW:         1.10 cm   LV E/e' medial:  9.6 LV IVS:        1.10 cm   LV e' lateral:   8.05 cm/s LVOT diam:     1.80 cm   LV E/e'  lateral: 8.8 LV SV:         79 LV SV Index:   37 LVOT Area:     2.54 cm  RIGHT VENTRICLE RV S prime:     20.80 cm/s TAPSE (M-mode): 2.6 cm LEFT ATRIUM             Index        RIGHT ATRIUM           Index LA diam:        2.80 cm 1.32 cm/m   RA Area:     25.20 cm LA Vol (A2C):   77.7 ml 36.66 ml/m  RA Volume:   76.70 ml  36.19 ml/m LA Vol (A4C):   84.0 ml 39.63 ml/m LA Biplane Vol: 88.6 ml 41.81 ml/m  AORTIC VALVE AV Area (Vmax):    2.08 cm AV Area (Vmean):   2.06 cm AV Area (VTI):     1.99 cm AV Vmax:           197.00 cm/s AV Vmean:          126.000 cm/s AV VTI:            0.397 m AV Peak Grad:      15.5 mmHg AV Mean Grad:      8.0 mmHg LVOT Vmax:         161.00 cm/s LVOT Vmean:        102.000 cm/s LVOT VTI:          0.310 m LVOT/AV VTI ratio: 0.78  AORTA Ao Root diam: 2.90 cm MITRAL VALVE               TRICUSPID VALVE MV Area (PHT): 2.83 cm    TR Peak grad:   26.4 mmHg MV Decel Time: 268 msec    TR Vmax:        257.00 cm/s  MV E velocity: 70.70 cm/s MV A velocity: 65.20 cm/s  SHUNTS MV E/A ratio:  1.08        Systemic VTI:  0.31 m                            Systemic Diam: 1.80 cm Dorn Ross MD Electronically signed by Dorn Ross MD Signature Date/Time: 01/03/2024/12:08:52 PM    Final    DG Chest Owen 1 View Result Date: 01/03/2024 CLINICAL DATA:  Dyspnea. EXAM: PORTABLE CHEST 1 VIEW COMPARISON:  December 29, 2023. FINDINGS: Stable cardiomediastinal silhouette. Mildly improved bibasilar interstitial densities are noted suggesting improving edema or atypical inflammation. Small left pleural effusion may be present. Bony thorax is unremarkable. IMPRESSION: Improved bibasilar opacities as noted above. Electronically Signed   By: Lynwood Landy Raddle M.D.   On: 01/03/2024 10:38   CT Angio Chest PE W and/or Wo Contrast Result Date: 12/29/2023 CLINICAL DATA:  83 year old male with history of shortness of breath. Low oxygen saturation. Evaluate for pulmonary embolism. EXAM: CT ANGIOGRAPHY CHEST WITH CONTRAST TECHNIQUE: Multidetector CT imaging of the chest was performed using the standard protocol during bolus administration of intravenous contrast. Multiplanar CT image reconstructions and MIPs were obtained to evaluate the vascular anatomy. RADIATION DOSE REDUCTION: This exam was performed according to the departmental dose-optimization program which includes automated exposure control, adjustment of the mA and/or kV according to patient size and/or use of iterative reconstruction technique. CONTRAST:  60mL OMNIPAQUE  IOHEXOL  350 MG/ML SOLN COMPARISON:  Chest CT 11/02/2023. FINDINGS: Comment: Today's study is severely limited  for assessment of pulmonary embolism by suboptimal contrast bolus, excessive image artifact from the patient being imaged with the arms in the down position, and extensive patient respiratory motion. Cardiovascular: Within the limitations of today's examination, there is no central or lobar sized filling defect to  suggest large pulmonary embolism. Unfortunately, secondary to the limitations of today's examination, segmental and subsegmental sized filling defects can not entirely be excluded. Heart size is normal. There is no significant pericardial fluid, thickening or pericardial calcification. There is aortic atherosclerosis, as well as atherosclerosis of the great vessels of the mediastinum and the coronary arteries, including calcified atherosclerotic plaque in the left main, left anterior descending, left circumflex and right coronary arteries. Thickening and calcification of the aortic valve. Aneurysmal dilatation of the ascending thoracic aorta (4.7 cm in diameter), unchanged. Mediastinum/Nodes: No pathologically enlarged mediastinal or hilar lymph nodes. Esophagus is unremarkable in appearance. No axillary lymphadenopathy. Lungs/Pleura: Assessment of the lung parenchyma is substantially limited by patient respiratory motion. However, there are clearly widespread areas of thickening of the peribronchovascular interstitium with extensive peribronchovascular ground-glass attenuation and consolidative changes scattered throughout the lungs bilaterally, indicative of widespread bilateral multilobar bronchopneumonia, most severe in the right upper and right lower lobes. No pleural effusions. Upper Abdomen: Aortic atherosclerosis. Exophytic 3 cm low-attenuation lesion in the upper pole of the left kidney, incompletely characterized on today's noncontrast CT examination, but statistically likely cysts (no imaging follow-up recommended). Numerous calcified granulomas in the spleen incidentally noted. Musculoskeletal: There are no aggressive appearing lytic or blastic lesions noted in the visualized portions of the skeleton. Review of the MIP images confirms the above findings. IMPRESSION: 1. Severely limited examination demonstrating no central or lobar sized pulmonary embolism. Accurate assessment for smaller segmental and  subsegmental sized emboli is not possible on the basis of today's examination. 2. Severe multilobar bilateral bronchopneumonia, as above. 3. Aortic atherosclerosis, in addition to left main and three-vessel coronary artery disease. Aneurysmal dilatation of the ascending thoracic aorta (4.7 cm in diameter), similar to the prior study. Ascending thoracic aortic aneurysm. Recommend semi-annual imaging followup by CTA or MRA and referral to cardiothoracic surgery if not already obtained. This recommendation follows 2010 ACCF/AHA/AATS/ACR/ASA/SCA/SCAI/SIR/STS/SVM Guidelines for the Diagnosis and Management of Patients With Thoracic Aortic Disease. Circulation. 2010; 121: Z733-z630. Aortic aneurysm NOS (ICD10-I71.9). Aortic Atherosclerosis (ICD10-I70.0). Aortic aneurysm NOS (ICD10-I71.9). Electronically Signed   By: Toribio Aye M.D.   On: 12/29/2023 07:13   DG Chest Portable 1 View Result Date: 12/29/2023 CLINICAL DATA:  Shortness of breath, palpitations EXAM: PORTABLE CHEST 1 VIEW COMPARISON:  10/19/2023 FINDINGS: Heart and mediastinal contours within normal limits. Loop recorder device in place. Diffuse interstitial prominence in the lower lobes. No effusions or acute bony abnormality. IMPRESSION: Diffuse interstitial prominence in the lower lobes could reflect edema or infection. Electronically Signed   By: Franky Crease M.D.   On: 12/29/2023 03:16    Alm Schneider, DO  [facility]  If 7PM-7AM, please contact night-coverage [email or web address] Password [email or web address] 01/04/2024, 5:16 PM   LOS: 6 days

## 2024-01-04 NOTE — Progress Notes (Signed)
 [id number] Family wanted to allow patient to sleep and declined DPI at this time.

## 2024-01-04 NOTE — Progress Notes (Signed)
 2595 Patient still resting, DPI not administered.

## 2024-01-04 NOTE — Progress Notes (Signed)
 Telemetry reviewed, remains in NSR. Would continue amiodarone  400mg  bid for additional 6 days, then 200mg  bid x 2 weeks, then 200mg  daily. Would plan to d/c amio at outpatient f/u. Consolidate diltiazem  to long acting 180mg  daily. No additional cardiology recs at this time, we will sign off inpatient care and arrange outpatient f/u  Dorn Ross MD

## 2024-01-05 DIAGNOSIS — J181 Lobar pneumonia, unspecified organism: Secondary | ICD-10-CM | POA: Diagnosis not present

## 2024-01-05 DIAGNOSIS — N1832 Chronic kidney disease, stage 3b: Secondary | ICD-10-CM | POA: Diagnosis not present

## 2024-01-05 DIAGNOSIS — J9611 Chronic respiratory failure with hypoxia: Secondary | ICD-10-CM

## 2024-01-05 DIAGNOSIS — E440 Moderate protein-calorie malnutrition: Secondary | ICD-10-CM | POA: Insufficient documentation

## 2024-01-05 DIAGNOSIS — I4891 Unspecified atrial fibrillation: Secondary | ICD-10-CM | POA: Diagnosis not present

## 2024-01-05 LAB — BASIC METABOLIC PANEL
Anion gap: 10 (ref 5–15)
BUN: 30 mg/dL — ABNORMAL HIGH (ref 8–23)
CO2: 33 mmol/L — ABNORMAL HIGH (ref 22–32)
Calcium: 8.6 mg/dL — ABNORMAL LOW (ref 8.9–10.3)
Chloride: 94 mmol/L — ABNORMAL LOW (ref 98–111)
Creatinine, Ser: 1.59 mg/dL — ABNORMAL HIGH (ref 0.61–1.24)
GFR, Estimated: 43 mL/min — ABNORMAL LOW (ref >60)
Glucose, Bld: 182 mg/dL — ABNORMAL HIGH (ref 70–99)
Potassium: 3.6 mmol/L (ref 3.5–5.1)
Sodium: 137 mmol/L (ref 135–145)

## 2024-01-05 LAB — GLUCOSE, CAPILLARY
Glucose-Capillary: 128 mg/dL — ABNORMAL HIGH (ref 70–99)
Glucose-Capillary: 147 mg/dL — ABNORMAL HIGH (ref 70–99)

## 2024-01-05 LAB — MAGNESIUM: Magnesium: 2 mg/dL (ref 1.7–2.4)

## 2024-01-05 MED ORDER — POTASSIUM CHLORIDE CRYS ER 20 MEQ PO TBCR
20.0000 meq | EXTENDED_RELEASE_TABLET | Freq: Once | ORAL | Status: AC
  Administered 2024-01-05: 20 meq via ORAL
  Filled 2024-01-05: qty 1

## 2024-01-05 NOTE — Plan of Care (Signed)
 Problem: Fluid Volume: Goal: Hemodynamic stability will improve 01/05/2024 0713 by Catharine Kand, RN Outcome: Progressing 01/05/2024 0713 by Catharine Kand, RN Outcome: Progressing 01/05/2024 0713 by Catharine Kand, RN Outcome: Progressing   Problem: Clinical Measurements: Goal: Diagnostic test results will improve 01/05/2024 0713 by Catharine Kand, RN Outcome: Progressing 01/05/2024 0713 by Catharine Kand, RN Outcome: Progressing 01/05/2024 0713 by Catharine Kand, RN Outcome: Progressing Goal: Signs and symptoms of infection will decrease 01/05/2024 0713 by Catharine Kand, RN Outcome: Progressing 01/05/2024 0713 by Catharine Kand, RN Outcome: Progressing 01/05/2024 0713 by Catharine Kand, RN Outcome: Progressing   Problem: Respiratory: Goal: Ability to maintain adequate ventilation will improve 01/05/2024 0713 by Catharine Kand, RN Outcome: Progressing 01/05/2024 0713 by Catharine Kand, RN Outcome: Progressing 01/05/2024 0713 by Catharine Kand, RN Outcome: Progressing   Problem: Education: Goal: Knowledge of General Education information will improve Description: Including pain rating scale, medication(s)/side effects and non-pharmacologic comfort measures 01/05/2024 0713 by Catharine Kand, RN Outcome: Progressing 01/05/2024 0713 by Catharine Kand, RN Outcome: Progressing 01/05/2024 0713 by Catharine Kand, RN Outcome: Progressing   Problem: Health Behavior/Discharge Planning: Goal: Ability to manage health-related needs will improve 01/05/2024 0713 by Catharine Kand, RN Outcome: Progressing 01/05/2024 0713 by Catharine Kand, RN Outcome: Progressing 01/05/2024 0713 by Catharine Kand, RN Outcome: Progressing   Problem: Clinical Measurements: Goal: Ability to maintain clinical measurements within normal limits will improve 01/05/2024 0713 by Catharine Kand, RN Outcome: Progressing 01/05/2024 0713 by Catharine Kand, RN Outcome: Progressing 01/05/2024 0713 by Catharine Kand, RN Outcome: Progressing Goal: Will remain free from infection 01/05/2024 0713 by Catharine Kand, RN Outcome: Progressing 01/05/2024 0713 by Catharine Kand, RN Outcome: Progressing 01/05/2024 0713 by Catharine Kand, RN Outcome: Progressing Goal: Diagnostic test results will improve 01/05/2024 0713 by Catharine Kand, RN Outcome: Progressing 01/05/2024 0713 by Catharine Kand, RN Outcome: Progressing 01/05/2024 0713 by Catharine Kand, RN Outcome: Progressing Goal: Respiratory complications will improve 01/05/2024 0713 by Cathline Dowen, RN Outcome: Progressing 01/05/2024 0713 by Catharine Kand, RN Outcome: Progressing 01/05/2024 0713 by Catharine Kand, RN Outcome: Progressing Goal: Cardiovascular complication will be avoided 01/05/2024 0713 by Catharine Kand, RN Outcome: Progressing 01/05/2024 0713 by Catharine Kand, RN Outcome: Progressing 01/05/2024 0713 by Catharine Kand, RN Outcome: Progressing   Problem: Activity: Goal: Risk for activity intolerance will decrease 01/05/2024 0713 by Catharine Kand, RN Outcome: Progressing 01/05/2024 0713 by Catharine Kand, RN Outcome: Progressing 01/05/2024 0713 by Catharine Kand, RN Outcome: Progressing   Problem: Nutrition: Goal: Adequate nutrition will be maintained 01/05/2024 0713 by Catharine Kand, RN Outcome: Progressing 01/05/2024 0713 by Catharine Kand, RN Outcome: Progressing 01/05/2024 0713 by Catharine Kand, RN Outcome: Progressing   Problem: Coping: Goal: Level of anxiety will decrease 01/05/2024 0713 by Catharine Kand, RN Outcome: Progressing 01/05/2024 0713 by Catharine Kand, RN Outcome: Progressing 01/05/2024 0713 by Catharine Kand, RN Outcome: Progressing   Problem: Elimination: Goal: Will not experience complications related to bowel motility 01/05/2024 0713 by Catharine Kand, RN Outcome: Progressing 01/05/2024 0713 by Catharine Kand, RN Outcome: Progressing 01/05/2024 0713 by Catharine Kand, RN Outcome: Progressing Goal: Will not experience complications related to urinary retention 01/05/2024 0713 by Catharine Kand, RN Outcome: Progressing 01/05/2024 0713 by Catharine Kand, RN Outcome: Progressing 01/05/2024  0713 by Catharine Kand, RN Outcome: Progressing   Problem: Pain Management: Goal: General experience of comfort will improve 01/05/2024 0713 by Catharine Kand, RN Outcome: Progressing 01/05/2024 0713 by Catharine Kand, RN Outcome: Progressing 01/05/2024 0713 by Catharine Kand, RN Outcome: Progressing   Problem: Safety: Goal: Ability to remain free from  injury will improve 01/05/2024 0713 by Catharine Kand, RN Outcome: Progressing 01/05/2024 0713 by Catharine Kand, RN Outcome: Progressing 01/05/2024 0713 by Catharine Kand, RN Outcome: Progressing   Problem: Skin Integrity: Goal: Risk for impaired skin integrity will decrease 01/05/2024 0713 by Catharine Kand, RN Outcome: Progressing 01/05/2024 0713 by Catharine Kand, RN Outcome: Progressing 01/05/2024 0713 by Catharine Kand, RN Outcome: Progressing   Problem: Safety: Goal: Non-violent Restraint(s) 01/05/2024 0713 by Catharine Kand, RN Outcome: Progressing 01/05/2024 0713 by Catharine Kand, RN Outcome: Progressing 01/05/2024 0713 by Catharine Kand, RN Outcome: Progressing   Problem: Education: Goal: Knowledge of disease or condition will improve 01/05/2024 0713 by Catharine Kand, RN Outcome: Progressing 01/05/2024 0713 by Catharine Kand, RN Outcome: Progressing 01/05/2024 0713 by Catharine Kand, RN Outcome: Progressing Goal: Knowledge of the prescribed therapeutic regimen will improve 01/05/2024 0713 by Catharine Kand, RN Outcome: Progressing 01/05/2024 0713 by Catharine Kand, RN Outcome: Progressing 01/05/2024 0713 by Catharine Kand, RN Outcome: Progressing Goal: Individualized Educational Video(s) 01/05/2024 0713 by Catharine Kand, RN Outcome: Progressing 01/05/2024 0713 by Catharine Kand, RN Outcome: Progressing 01/05/2024 0713 by Catharine Kand, RN Outcome: Progressing   Problem: Activity: Goal: Ability to tolerate increased activity will improve 01/05/2024 0713 by Catharine Kand, RN Outcome: Progressing 01/05/2024 0713 by Catharine Kand, RN Outcome: Progressing 01/05/2024 0713 by Catharine Kand, RN Outcome:  Progressing Goal: Will verbalize the importance of balancing activity with adequate rest periods 01/05/2024 0713 by Catharine Kand, RN Outcome: Progressing 01/05/2024 0713 by Catharine Kand, RN Outcome: Progressing 01/05/2024 0713 by Catharine Kand, RN Outcome: Progressing   Problem: Respiratory: Goal: Ability to maintain a clear airway will improve 01/05/2024 0713 by Catharine Kand, RN Outcome: Progressing 01/05/2024 0713 by Catharine Kand, RN Outcome: Progressing 01/05/2024 0713 by Catharine Kand, RN Outcome: Progressing Goal: Levels of oxygenation will improve 01/05/2024 0713 by Catharine Kand, RN Outcome: Progressing 01/05/2024 0713 by Catharine Kand, RN Outcome: Progressing 01/05/2024 0713 by Catharine Kand, RN Outcome: Progressing Goal: Ability to maintain adequate ventilation will improve 01/05/2024 0713 by Catharine Kand, RN Outcome: Progressing 01/05/2024 0713 by Catharine Kand, RN Outcome: Progressing 01/05/2024 0713 by Catharine Kand, RN Outcome: Progressing   Problem: Education: Goal: Knowledge of disease and its progression will improve 01/05/2024 0713 by Catharine Kand, RN Outcome: Progressing 01/05/2024 0713 by Catharine Kand, RN Outcome: Progressing 01/05/2024 0713 by Catharine Kand, RN Outcome: Progressing Goal: Individualized Educational Video(s) 01/05/2024 0713 by Catharine Kand, RN Outcome: Progressing 01/05/2024 0713 by Catharine Kand, RN Outcome: Progressing 01/05/2024 0713 by Catharine Kand, RN Outcome: Progressing   Problem: Fluid Volume: Goal: Compliance with measures to maintain balanced fluid volume will improve 01/05/2024 0713 by Catharine Kand, RN Outcome: Progressing 01/05/2024 0713 by Catharine Kand, RN Outcome: Progressing 01/05/2024 0713 by Catharine Kand, RN Outcome: Progressing   Problem: Health Behavior/Discharge Planning: Goal: Ability to manage health-related needs will improve 01/05/2024 0713 by Catharine Kand, RN Outcome: Progressing 01/05/2024 0713 by Catharine Kand, RN Outcome: Progressing 01/05/2024 0713 by Catharine Kand,  RN Outcome: Progressing   Problem: Nutritional: Goal: Ability to make healthy dietary choices will improve 01/05/2024 0713 by Catharine Kand, RN Outcome: Progressing 01/05/2024 0713 by Catharine Kand, RN Outcome: Progressing 01/05/2024 0713 by Catharine Kand, RN Outcome: Progressing   Problem: Clinical Measurements: Goal: Complications related to the disease process, condition or treatment will be avoided or minimized 01/05/2024 0713 by Catharine Kand, RN Outcome: Progressing 01/05/2024 0713 by Catharine Kand, RN Outcome: Progressing 01/05/2024 0713 by Catharine Kand, RN Outcome: Progressing

## 2024-01-05 NOTE — Progress Notes (Signed)
 Family at bedside bed alarm was off for bathing and placed back on bed alarm for his high fall risk.

## 2024-01-05 NOTE — Progress Notes (Signed)
 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 Presenting today with progressive shortness of breath, hypoxia on 2-4 L satting 88%... Improved to 90% on 6 L, also complaining of palpitation and dizziness.   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 P.o. diltiazem  was reintroduced to help with heart rates. Ultimately, patient converted to sinus rhythm.  His amiodarone  was converted to po and his diltiazem  was converted to long acting.   Assessment/Plan: Sepsis -Present on admission -Secondary to pneumonia -12/29/2023 CTA chest negative PE, positive multilobar pneumonia, 4.7 cm ascending thoracic aneurysm -Patient was started on ceftriaxone  and azithromycin  -Sepsis physiology overall has improved   Lobar pneumonia -PCT 0.45 -MRSA screen negative -Transition to cefdinir  and  doxycycline  x 1 more day -COVID-19 PCR negative -Blood cultures negative   Acute on chronic respiratory failure with hypoxia and hypercarbia -Initially on 6 L -Secondary to pneumonia and COPD exacerbation -Previously on 4 L nasal cannula at home -Currently stable on 2L>>4L>>2 L -Continue bronchodilators -finished steroid burst -no further wheezing -1/9--personally reviewed CXR--mild increased interstitial markings, improving bibasilar opacities -aim for oxygen saturation >90% -01/03/23 ABG--7.46/50/59/36   Paroxysmal atrial fibrillation -Appreciate cardiology -1/9--converted to sinus early am>>remains in sinus -Continue IV amiodarone >>po amio on 1/9 -Continue diltiazem >>consolidate to long acting -Continue apixaban  -01/03/24 echo--EF 65-70%, no WMA, normal RVF, mild TR   Acute heart failure with preserved EF -Patient was dosed with IV furosemide  01/01/2024 -net neg 5.1 L -01/03/24 echo--EF 65-70%, no WMA, normal RVF, mild TR -redose IV lasix  1/10 x 1 with increase oxygen demand and increase crackles on exam   CKD stage IIIb -Baseline creatinine 1.5-1.8   Mixed hyperlipidemia -Continue statin   Thoracic ascending aortic aneurysm (HCC) -Chronic history of thoracic ascending aortic aneurysm -Based on today's CTA 4.7 cm no changes -Follow-up as an outpatient and monitor closely   Major neurocognitive disorder -At risk for hospital delirium                   Family Communication: spouse updated 1/11   Consultants:  cardiology   Code Status: DNR   DVT Prophylaxis:  apixaban      Procedures: As Listed in Progress Note Above   Antibiotics: Ceftriaxone  1/4>>1/7 Azithro 1/4>>1/6 Cefdinir  1/8>> Doxy 1/8>>           Subjective: Patient denies fevers, chills, headache, chest pain, dyspnea, nausea, vomiting, diarrhea, abdominal pain.   Objective: Vitals:  01/05/24 0500 01/05/24 0854 01/05/24 0857 01/05/24 1440  BP:  123/64  125/69  Pulse:  75  69   Resp:  20    Temp:    98.6 F (37 C)  TempSrc:    Oral  SpO2:  96% 96% (!) 82%  Weight: 84.7 kg     Height:        Intake/Output Summary (Last 24 hours) at 01/05/2024 1559 Last data filed at 01/04/2024 1959 Gross per 24 hour  Intake --  Output 1350 ml  Net -1350 ml   Weight change:  Exam:  General:  Pt is alert, follows commands appropriately, not in acute distress HEENT: No icterus, No thrush, No neck mass, [place]/AT Cardiovascular: RRR, S1/S2, no rubs, no gallops Respiratory: fine bibasilar crackles.  No wheeze Abdomen: Soft/+BS, non tender, non distended, no guarding Extremities: No edema, No lymphangitis, No petechiae, No rashes, no synovitis   Data Reviewed: I have personally reviewed following labs and imaging studies Basic Metabolic Panel: Recent Labs  Lab 01/01/24 0507 01/02/24 0456 01/03/24 0448 01/04/24 0442 01/05/24 0422  NA 137 136 137 137 137  K 4.1 3.5 3.6 4.2 3.6  CL 103 100 100 99 94*  CO2 29 29 31 31  33*  GLUCOSE 164* 126* 117* 111* 182*  BUN 45* 41* 38* 36* 30*  CREATININE 1.55* 1.61* 1.49* 1.53* 1.59*  CALCIUM  8.4* 8.2* 8.1* 8.4* 8.6*  MG  --  2.0 2.0 2.1 2.0   Liver Function Tests: Recent Labs  Lab 12/30/23 0554 12/31/23 0435 01/01/24 0507  AST 31 39 39  ALT 21 26 30   ALKPHOS 60 70 64  BILITOT 0.8 0.5 0.5  PROT 6.1* 6.7 5.9*  ALBUMIN 2.7* 3.0* 2.5*   No results for input(s): LIPASE, AMYLASE in the last 168 hours. No results for input(s): AMMONIA in the last 168 hours. Coagulation Profile: No results for input(s): INR, PROTIME in the last 168 hours. CBC: Recent Labs  Lab 12/30/23 0554 12/31/23 0435 01/01/24 0507 01/02/24 0438 01/03/24 0448  WBC 8.0 13.8* 14.0* 9.8 10.1  HGB 11.0* 12.2* 11.8* 12.3* 12.6*  HCT 35.6* 39.0 37.0* 39.3 39.8  MCV 98.9 98.5 98.4 98.0 98.3  PLT 258 334 352 365 351   Cardiac Enzymes: No results for input(s): CKTOTAL, CKMB, CKMBINDEX, TROPONINI in the last 168 hours. BNP: Invalid  input(s): POCBNP CBG: Recent Labs  Lab 01/02/24 0733 01/03/24 0744 01/04/24 0814 01/05/24 0704 01/05/24 1109  GLUCAP 107* 93 96 128* 147*   HbA1C: No results for input(s): HGBA1C in the last 72 hours. Urine analysis:    Component Value Date/Time   COLORURINE YELLOW 06/13/2016 1120   APPEARANCEUR Clear 12/04/2023 1539   LABSPEC 1.020 06/13/2016 1120   PHURINE 5.5 06/13/2016 1120   GLUCOSEU Negative 12/04/2023 1539   HGBUR NEGATIVE 06/13/2016 1120   BILIRUBINUR Negative 12/04/2023 1539   KETONESUR NEGATIVE 06/13/2016 1120   PROTEINUR Negative 12/04/2023 1539   PROTEINUR NEGATIVE 06/13/2016 1120   UROBILINOGEN negative (A) 05/04/2020 0930   NITRITE Negative 12/04/2023 1539   NITRITE NEGATIVE 06/13/2016 1120   LEUKOCYTESUR Trace (A) 12/04/2023 1539   Sepsis Labs: @LABRCNTIP (procalcitonin:4,lacticidven:4) ) Recent Results (from the past 240 hours)  Culture, blood (single)     Status: None   Collection Time: 12/29/23  7:48 AM   Specimen: Left Antecubital; Blood  Result Value Ref Range Status   Specimen Description   Final    LEFT ANTECUBITAL BOTTLES DRAWN AEROBIC AND ANAEROBIC   Special Requests   Final  Blood Culture results may not be optimal due to an inadequate volume of blood received in culture bottles   Culture   Final    NO GROWTH 5 DAYS Performed at [facility], [address], [place], [place] [address]    Report Status 01/03/2024 FINAL  Final  Culture, blood (x 2)     Status: None   Collection Time: 12/29/23  8:10 AM   Specimen: BLOOD LEFT HAND  Result Value Ref Range Status   Specimen Description   Final    BLOOD LEFT HAND BOTTLES DRAWN AEROBIC AND ANAEROBIC   Special Requests Blood Culture adequate volume  Final   Culture   Final    NO GROWTH 5 DAYS Performed at [facility], [address], [place], [place] [address]    Report Status 01/03/2024 FINAL  Final  Resp panel by RT-PCR (RSV, Flu A&B, Covid) Anterior Nasal Swab     Status: None    Collection Time: 12/29/23  8:22 AM   Specimen: Anterior Nasal Swab  Result Value Ref Range Status   SARS Coronavirus 2 by RT PCR NEGATIVE NEGATIVE Final    Comment: (NOTE) SARS-CoV-2 target nucleic acids are NOT DETECTED.  The SARS-CoV-2 RNA is generally detectable in upper respiratory specimens during the acute phase of infection. The lowest concentration of SARS-CoV-2 viral copies this assay can detect is 138 copies/mL. A negative result does not preclude SARS-Cov-2 infection and should not be used as the sole basis for treatment or other patient management decisions. A negative result may occur with  improper specimen collection/handling, submission of specimen other than nasopharyngeal swab, presence of viral mutation(s) within the areas targeted by this assay, and inadequate number of viral copies(<138 copies/mL). A negative result must be combined with clinical observations, patient history, and epidemiological information. The expected result is Negative.  Fact Sheet for Patients:  bloggercourse.com  Fact Sheet for Healthcare Providers:  seriousbroker.it  This test is no t yet approved or cleared by the [place]  FDA and  has been authorized for detection and/or diagnosis of SARS-CoV-2 by FDA under an Emergency Use Authorization (EUA). This EUA will remain  in effect (meaning this test can be used) for the duration of the COVID-19 declaration under Section 564(b)(1) of the Act, 21 U.S.C.section 360bbb-3(b)(1), unless the authorization is terminated  or revoked sooner.       Influenza A by PCR NEGATIVE NEGATIVE Final   Influenza B by PCR NEGATIVE NEGATIVE Final    Comment: (NOTE) The Xpert Xpress SARS-CoV-2/FLU/RSV plus assay is intended as an aid in the diagnosis of influenza from Nasopharyngeal swab specimens and should not be used as a sole basis for treatment. Nasal washings and aspirates are unacceptable for  Xpert Xpress SARS-CoV-2/FLU/RSV testing.  Fact Sheet for Patients: bloggercourse.com  Fact Sheet for Healthcare Providers: seriousbroker.it  This test is not yet approved or cleared by the [place]  FDA and has been authorized for detection and/or diagnosis of SARS-CoV-2 by FDA under an Emergency Use Authorization (EUA). This EUA will remain in effect (meaning this test can be used) for the duration of the COVID-19 declaration under Section 564(b)(1) of the Act, 21 U.S.C. section 360bbb-3(b)(1), unless the authorization is terminated or revoked.     Resp Syncytial Virus by PCR NEGATIVE NEGATIVE Final    Comment: (NOTE) Fact Sheet for Patients: bloggercourse.com  Fact Sheet for Healthcare Providers: seriousbroker.it  This test is not yet approved or cleared by the [place]  FDA and has been authorized for  detection and/or diagnosis of SARS-CoV-2 by FDA under an Emergency Use Authorization (EUA). This EUA will remain in effect (meaning this test can be used) for the duration of the COVID-19 declaration under Section 564(b)(1) of the Act, 21 U.S.C. section 360bbb-3(b)(1), unless the authorization is terminated or revoked.  Performed at [facility], [address] [place], [place] [address]   MRSA Next Gen by PCR, Nasal     Status: None   Collection Time: 12/29/23  1:13 PM   Specimen: Nasal Mucosa; Nasal Swab  Result Value Ref Range Status   MRSA by PCR Next Gen NOT DETECTED NOT DETECTED Final    Comment: (NOTE) The GeneXpert MRSA Assay (FDA approved for NASAL specimens only), is one component of a comprehensive MRSA colonization surveillance program. It is not intended to diagnose MRSA infection nor to guide or monitor treatment for MRSA infections. Test performance is not FDA approved in patients less than 43 years old. Performed at [facility], [address],  [place], [place] [address]      Scheduled Meds:  amiodarone   400 mg Oral BID   apixaban   5 mg Oral BID   atorvastatin   20 mg Oral Daily   cefdinir   300 mg Oral Q12H   diltiazem   180 mg Oral Daily   doxycycline   100 mg Oral Q12H   feeding supplement  237 mL Oral BID BM   finasteride   5 mg Oral q AM   fluticasone  furoate-vilanterol  1 puff Inhalation Daily   melatonin  6 mg Oral QHS   multivitamin with minerals  1 tablet Oral Daily   potassium chloride   20 mEq Oral Once   sertraline   50 mg Oral q morning   sodium chloride  flush  3 mL Intravenous Q12H   sodium chloride  flush  3 mL Intravenous Q12H   traZODone   50 mg Oral QHS   Continuous Infusions:  Procedures/Studies: ECHOCARDIOGRAM COMPLETE Result Date: 01/03/2024    ECHOCARDIOGRAM REPORT   Patient Name:   Kyle Owen Date of Exam: 01/03/2024 Medical Rec #:  [id number]       Height:       72.0 in Accession #:    [id number]      Weight:       197.5 lb Date of Birth:  Jul 22, 1941        BSA:          2.119 m Patient Age:    82 years        BP:           133/65 mmHg Patient Gender: M               HR:           69 bpm. Exam Location:  [facility] Procedure: 2D Echo, Cardiac Doppler and Color Doppler Indications:    Atrial Fibrillation I48.91  History:        Patient has prior history of Echocardiogram examinations, most                 recent 07/27/2015. COPD and Stroke, Arrythmias:Atrial                 Fibrillation; Risk Factors:Dyslipidemia. Advanced dementia                 (HCC).  Sonographer:    Aida Pizza RCS Referring Phys: [id number] DORN FALCON BRANCH IMPRESSIONS  1. Left ventricular ejection fraction, by estimation, is 65 to 70%. The left ventricle has normal  function. The left ventricle has no regional wall motion abnormalities. There is mild left ventricular hypertrophy. Left ventricular diastolic parameters are indeterminate.  2. Right ventricular systolic function is normal. The right ventricular size is normal. There is normal pulmonary  artery systolic pressure.  3. Left atrial size was mildly dilated.  4. The mitral valve is normal in structure. No evidence of mitral valve regurgitation. No evidence of mitral stenosis.  5. The tricuspid valve is abnormal.  6. The aortic valve is tricuspid. There is moderate calcification of the aortic valve. There is moderate thickening of the aortic valve. Aortic valve regurgitation is not visualized. No aortic stenosis is present.  7. The inferior vena cava is normal in size with greater than 50% respiratory variability, suggesting right atrial pressure of 3 mmHg. FINDINGS  Left Ventricle: Left ventricular ejection fraction, by estimation, is 65 to 70%. The left ventricle has normal function. The left ventricle has no regional wall motion abnormalities. The left ventricular internal cavity size was normal in size. There is  mild left ventricular hypertrophy. Left ventricular diastolic parameters are indeterminate. Right Ventricle: The right ventricular size is normal. Right vetricular wall thickness was not well visualized. Right ventricular systolic function is normal. There is normal pulmonary artery systolic pressure. The tricuspid regurgitant velocity is 2.57 m/s, and with an assumed right atrial pressure of 3 mmHg, the estimated right ventricular systolic pressure is 29.4 mmHg. Left Atrium: Left atrial size was mildly dilated. Right Atrium: Right atrial size was normal in size. Pericardium: There is no evidence of pericardial effusion. Mitral Valve: The mitral valve is normal in structure. No evidence of mitral valve regurgitation. No evidence of mitral valve stenosis. Tricuspid Valve: The tricuspid valve is abnormal. Tricuspid valve regurgitation is mild . No evidence of tricuspid stenosis. Aortic Valve: The aortic valve is tricuspid. There is moderate calcification of the aortic valve. There is moderate thickening of the aortic valve. There is moderate aortic valve annular calcification. Aortic valve  regurgitation is not visualized. No aortic stenosis is present. Aortic valve mean gradient measures 8.0 mmHg. Aortic valve peak gradient measures 15.5 mmHg. Aortic valve area, by VTI measures 1.99 cm. Pulmonic Valve: The pulmonic valve was not well visualized. Pulmonic valve regurgitation is not visualized. No evidence of pulmonic stenosis. Aorta: The aortic root is normal in size and structure. Venous: The inferior vena cava is normal in size with greater than 50% respiratory variability, suggesting right atrial pressure of 3 mmHg. IAS/Shunts: No atrial level shunt detected by color flow Doppler.  LEFT VENTRICLE PLAX 2D LVIDd:         4.60 cm   Diastology LVIDs:         2.10 cm   LV e' medial:    7.40 cm/s LV PW:         1.10 cm   LV E/e' medial:  9.6 LV IVS:        1.10 cm   LV e' lateral:   8.05 cm/s LVOT diam:     1.80 cm   LV E/e' lateral: 8.8 LV SV:         79 LV SV Index:   37 LVOT Area:     2.54 cm  RIGHT VENTRICLE RV S prime:     20.80 cm/s TAPSE (M-mode): 2.6 cm LEFT ATRIUM             Index        RIGHT ATRIUM  Index LA diam:        2.80 cm 1.32 cm/m   RA Area:     25.20 cm LA Vol (A2C):   77.7 ml 36.66 ml/m  RA Volume:   76.70 ml  36.19 ml/m LA Vol (A4C):   84.0 ml 39.63 ml/m LA Biplane Vol: 88.6 ml 41.81 ml/m  AORTIC VALVE AV Area (Vmax):    2.08 cm AV Area (Vmean):   2.06 cm AV Area (VTI):     1.99 cm AV Vmax:           197.00 cm/s AV Vmean:          126.000 cm/s AV VTI:            0.397 m AV Peak Grad:      15.5 mmHg AV Mean Grad:      8.0 mmHg LVOT Vmax:         161.00 cm/s LVOT Vmean:        102.000 cm/s LVOT VTI:          0.310 m LVOT/AV VTI ratio: 0.78  AORTA Ao Root diam: 2.90 cm MITRAL VALVE               TRICUSPID VALVE MV Area (PHT): 2.83 cm    TR Peak grad:   26.4 mmHg MV Decel Time: 268 msec    TR Vmax:        257.00 cm/s MV E velocity: 70.70 cm/s MV A velocity: 65.20 cm/s  SHUNTS MV E/A ratio:  1.08        Systemic VTI:  0.31 m                            Systemic Diam:  1.80 cm Dorn Ross MD Electronically signed by Dorn Ross MD Signature Date/Time: 01/03/2024/12:08:52 PM    Final    DG Chest Port 1 View Result Date: 01/03/2024 CLINICAL DATA:  Dyspnea. EXAM: PORTABLE CHEST 1 VIEW COMPARISON:  December 29, 2023. FINDINGS: Stable cardiomediastinal silhouette. Mildly improved bibasilar interstitial densities are noted suggesting improving edema or atypical inflammation. Small left pleural effusion may be present. Bony thorax is unremarkable. IMPRESSION: Improved bibasilar opacities as noted above. Electronically Signed   By: Lynwood Landy Raddle M.D.   On: 01/03/2024 10:38   CT Angio Chest PE W and/or Wo Contrast Result Date: 12/29/2023 CLINICAL DATA:  83 year old male with history of shortness of breath. Low oxygen saturation. Evaluate for pulmonary embolism. EXAM: CT ANGIOGRAPHY CHEST WITH CONTRAST TECHNIQUE: Multidetector CT imaging of the chest was performed using the standard protocol during bolus administration of intravenous contrast. Multiplanar CT image reconstructions and MIPs were obtained to evaluate the vascular anatomy. RADIATION DOSE REDUCTION: This exam was performed according to the departmental dose-optimization program which includes automated exposure control, adjustment of the mA and/or kV according to patient size and/or use of iterative reconstruction technique. CONTRAST:  60mL OMNIPAQUE  IOHEXOL  350 MG/ML SOLN COMPARISON:  Chest CT 11/02/2023. FINDINGS: Comment: Today's study is severely limited for assessment of pulmonary embolism by suboptimal contrast bolus, excessive image artifact from the patient being imaged with the arms in the down position, and extensive patient respiratory motion. Cardiovascular: Within the limitations of today's examination, there is no central or lobar sized filling defect to suggest large pulmonary embolism. Unfortunately, secondary to the limitations of today's examination, segmental and subsegmental sized filling defects  can not entirely be excluded. Heart size is normal. There is no  significant pericardial fluid, thickening or pericardial calcification. There is aortic atherosclerosis, as well as atherosclerosis of the great vessels of the mediastinum and the coronary arteries, including calcified atherosclerotic plaque in the left main, left anterior descending, left circumflex and right coronary arteries. Thickening and calcification of the aortic valve. Aneurysmal dilatation of the ascending thoracic aorta (4.7 cm in diameter), unchanged. Mediastinum/Nodes: No pathologically enlarged mediastinal or hilar lymph nodes. Esophagus is unremarkable in appearance. No axillary lymphadenopathy. Lungs/Pleura: Assessment of the lung parenchyma is substantially limited by patient respiratory motion. However, there are clearly widespread areas of thickening of the peribronchovascular interstitium with extensive peribronchovascular ground-glass attenuation and consolidative changes scattered throughout the lungs bilaterally, indicative of widespread bilateral multilobar bronchopneumonia, most severe in the right upper and right lower lobes. No pleural effusions. Upper Abdomen: Aortic atherosclerosis. Exophytic 3 cm low-attenuation lesion in the upper pole of the left kidney, incompletely characterized on today's noncontrast CT examination, but statistically likely cysts (no imaging follow-up recommended). Numerous calcified granulomas in the spleen incidentally noted. Musculoskeletal: There are no aggressive appearing lytic or blastic lesions noted in the visualized portions of the skeleton. Review of the MIP images confirms the above findings. IMPRESSION: 1. Severely limited examination demonstrating no central or lobar sized pulmonary embolism. Accurate assessment for smaller segmental and subsegmental sized emboli is not possible on the basis of today's examination. 2. Severe multilobar bilateral bronchopneumonia, as above. 3. Aortic  atherosclerosis, in addition to left main and three-vessel coronary artery disease. Aneurysmal dilatation of the ascending thoracic aorta (4.7 cm in diameter), similar to the prior study. Ascending thoracic aortic aneurysm. Recommend semi-annual imaging followup by CTA or MRA and referral to cardiothoracic surgery if not already obtained. This recommendation follows 2010 ACCF/AHA/AATS/ACR/ASA/SCA/SCAI/SIR/STS/SVM Guidelines for the Diagnosis and Management of Patients With Thoracic Aortic Disease. Circulation. 2010; 121: Z733-z630. Aortic aneurysm NOS (ICD10-I71.9). Aortic Atherosclerosis (ICD10-I70.0). Aortic aneurysm NOS (ICD10-I71.9). Electronically Signed   By: Toribio Aye M.D.   On: 12/29/2023 07:13   DG Chest Portable 1 View Result Date: 12/29/2023 CLINICAL DATA:  Shortness of breath, palpitations EXAM: PORTABLE CHEST 1 VIEW COMPARISON:  10/19/2023 FINDINGS: Heart and mediastinal contours within normal limits. Loop recorder device in place. Diffuse interstitial prominence in the lower lobes. No effusions or acute bony abnormality. IMPRESSION: Diffuse interstitial prominence in the lower lobes could reflect edema or infection. Electronically Signed   By: Franky Crease M.D.   On: 12/29/2023 03:16    Alm Schneider, DO  [facility]  If 7PM-7AM, please contact night-coverage [email or web address] Password [email or web address] 01/05/2024, 3:59 PM   LOS: 7 days

## 2024-01-05 NOTE — Plan of Care (Signed)
 Problem: Fluid Volume: Goal: Hemodynamic stability will improve 01/05/2024 0713 by Catharine Kand, RN Outcome: Progressing 01/05/2024 0713 by Catharine Kand, RN Outcome: Progressing   Problem: Clinical Measurements: Goal: Diagnostic test results will improve 01/05/2024 0713 by Catharine Kand, RN Outcome: Progressing 01/05/2024 0713 by Catharine Kand, RN Outcome: Progressing Goal: Signs and symptoms of infection will decrease 01/05/2024 0713 by Catharine Kand, RN Outcome: Progressing 01/05/2024 0713 by Catharine Kand, RN Outcome: Progressing   Problem: Respiratory: Goal: Ability to maintain adequate ventilation will improve 01/05/2024 0713 by Catharine Kand, RN Outcome: Progressing 01/05/2024 0713 by Catharine Kand, RN Outcome: Progressing   Problem: Education: Goal: Knowledge of General Education information will improve Description: Including pain rating scale, medication(s)/side effects and non-pharmacologic comfort measures 01/05/2024 0713 by Catharine Kand, RN Outcome: Progressing 01/05/2024 0713 by Catharine Kand, RN Outcome: Progressing   Problem: Health Behavior/Discharge Planning: Goal: Ability to manage health-related needs will improve 01/05/2024 0713 by Catharine Kand, RN Outcome: Progressing 01/05/2024 0713 by Catharine Kand, RN Outcome: Progressing   Problem: Clinical Measurements: Goal: Ability to maintain clinical measurements within normal limits will improve 01/05/2024 0713 by Catharine Kand, RN Outcome: Progressing 01/05/2024 0713 by Catharine Kand, RN Outcome: Progressing Goal: Will remain free from infection 01/05/2024 0713 by Catharine Kand, RN Outcome: Progressing 01/05/2024 0713 by Catharine Kand, RN Outcome: Progressing Goal: Diagnostic test results will improve 01/05/2024 0713 by Catharine Kand, RN Outcome: Progressing 01/05/2024 0713 by Catharine Kand, RN Outcome: Progressing Goal: Respiratory complications will improve 01/05/2024 0713 by Cloyce Blankenhorn, RN Outcome: Progressing 01/05/2024 0713 by Breydan [place], RN Outcome:  Progressing Goal: Cardiovascular complication will be avoided 01/05/2024 0713 by Catharine Kand, RN Outcome: Progressing 01/05/2024 0713 by Catharine Kand, RN Outcome: Progressing   Problem: Activity: Goal: Risk for activity intolerance will decrease 01/05/2024 0713 by Catharine Kand, RN Outcome: Progressing 01/05/2024 0713 by Catharine Kand, RN Outcome: Progressing   Problem: Nutrition: Goal: Adequate nutrition will be maintained 01/05/2024 0713 by Catharine Kand, RN Outcome: Progressing 01/05/2024 0713 by Catharine Kand, RN Outcome: Progressing   Problem: Coping: Goal: Level of anxiety will decrease 01/05/2024 0713 by Catharine Kand, RN Outcome: Progressing 01/05/2024 0713 by Catharine Kand, RN Outcome: Progressing   Problem: Elimination: Goal: Will not experience complications related to bowel motility 01/05/2024 0713 by Catharine Kand, RN Outcome: Progressing 01/05/2024 0713 by Catharine Kand, RN Outcome: Progressing Goal: Will not experience complications related to urinary retention 01/05/2024 0713 by Catharine Kand, RN Outcome: Progressing 01/05/2024 0713 by Catharine Kand, RN Outcome: Progressing   Problem: Pain Management: Goal: General experience of comfort will improve 01/05/2024 0713 by Bonnell Placzek, RN Outcome: Progressing 01/05/2024 0713 by Catharine Kand, RN Outcome: Progressing   Problem: Safety: Goal: Ability to remain free from injury will improve 01/05/2024 0713 by Catharine Kand, RN Outcome: Progressing 01/05/2024 0713 by Catharine Kand, RN Outcome: Progressing   Problem: Skin Integrity: Goal: Risk for impaired skin integrity will decrease 01/05/2024 0713 by Catharine Kand, RN Outcome: Progressing 01/05/2024 0713 by Catharine Kand, RN Outcome: Progressing   Problem: Safety: Goal: Non-violent Restraint(s) 01/05/2024 0713 by Catharine Kand, RN Outcome: Progressing 01/05/2024 0713 by Catharine Kand, RN Outcome: Progressing   Problem: Education: Goal: Knowledge of disease or condition will improve 01/05/2024 0713 by Catharine Kand,  RN Outcome: Progressing 01/05/2024 0713 by Catharine Kand, RN Outcome: Progressing Goal: Knowledge of the prescribed therapeutic regimen will improve 01/05/2024 0713 by Catharine Kand, RN Outcome: Progressing 01/05/2024 0713 by Catharine Kand, RN Outcome: Progressing Goal: Individualized Educational Video(s) 01/05/2024 0713 by Catharine Kand, RN Outcome: Progressing  01/05/2024 0713 by Catharine Kand, RN Outcome: Progressing   Problem: Activity: Goal: Ability to tolerate increased activity will improve 01/05/2024 0713 by Catharine Kand, RN Outcome: Progressing 01/05/2024 0713 by Catharine Kand, RN Outcome: Progressing Goal: Will verbalize the importance of balancing activity with adequate rest periods 01/05/2024 0713 by Catharine Kand, RN Outcome: Progressing 01/05/2024 0713 by Catharine Kand, RN Outcome: Progressing   Problem: Respiratory: Goal: Ability to maintain a clear airway will improve 01/05/2024 0713 by Catharine Kand, RN Outcome: Progressing 01/05/2024 0713 by Catharine Kand, RN Outcome: Progressing Goal: Levels of oxygenation will improve 01/05/2024 0713 by Karandeep Resende, RN Outcome: Progressing 01/05/2024 0713 by Catharine Kand, RN Outcome: Progressing Goal: Ability to maintain adequate ventilation will improve 01/05/2024 0713 by Catharine Kand, RN Outcome: Progressing 01/05/2024 0713 by Catharine Kand, RN Outcome: Progressing   Problem: Education: Goal: Knowledge of disease and its progression will improve 01/05/2024 0713 by Catharine Kand, RN Outcome: Progressing 01/05/2024 0713 by Catharine Kand, RN Outcome: Progressing Goal: Individualized Educational Video(s) 01/05/2024 0713 by Catharine Kand, RN Outcome: Progressing 01/05/2024 0713 by Catharine Kand, RN Outcome: Progressing   Problem: Fluid Volume: Goal: Compliance with measures to maintain balanced fluid volume will improve 01/05/2024 0713 by Catharine Kand, RN Outcome: Progressing 01/05/2024 0713 by Catharine Kand, RN Outcome: Progressing   Problem: Health Behavior/Discharge  Planning: Goal: Ability to manage health-related needs will improve 01/05/2024 0713 by Catharine Kand, RN Outcome: Progressing 01/05/2024 0713 by Catharine Kand, RN Outcome: Progressing   Problem: Nutritional: Goal: Ability to make healthy dietary choices will improve 01/05/2024 0713 by Catharine Kand, RN Outcome: Progressing 01/05/2024 0713 by Catharine Kand, RN Outcome: Progressing   Problem: Clinical Measurements: Goal: Complications related to the disease process, condition or treatment will be avoided or minimized 01/05/2024 0713 by Catharine Kand, RN Outcome: Progressing 01/05/2024 0713 by Zahriyah Joo, RN Outcome: Progressing

## 2024-01-05 NOTE — Plan of Care (Signed)
  Problem: Fluid Volume: Goal: Hemodynamic stability will improve Outcome: Progressing   Problem: Clinical Measurements: Goal: Diagnostic test results will improve Outcome: Progressing Goal: Signs and symptoms of infection will decrease Outcome: Progressing   Problem: Respiratory: Goal: Ability to maintain adequate ventilation will improve Outcome: Progressing   Problem: Education: Goal: Knowledge of General Education information will improve Description: Including pain rating scale, medication(s)/side effects and non-pharmacologic comfort measures Outcome: Progressing   Problem: Health Behavior/Discharge Planning: Goal: Ability to manage health-related needs will improve Outcome: Progressing   Problem: Clinical Measurements: Goal: Ability to maintain clinical measurements within normal limits will improve Outcome: Progressing Goal: Will remain free from infection Outcome: Progressing Goal: Diagnostic test results will improve Outcome: Progressing Goal: Respiratory complications will improve Outcome: Progressing Goal: Cardiovascular complication will be avoided Outcome: Progressing   Problem: Activity: Goal: Risk for activity intolerance will decrease Outcome: Progressing   Problem: Nutrition: Goal: Adequate nutrition will be maintained Outcome: Progressing   Problem: Coping: Goal: Level of anxiety will decrease Outcome: Progressing   Problem: Elimination: Goal: Will not experience complications related to bowel motility Outcome: Progressing Goal: Will not experience complications related to urinary retention Outcome: Progressing   Problem: Pain Management: Goal: General experience of comfort will improve Outcome: Progressing   Problem: Safety: Goal: Ability to remain free from injury will improve Outcome: Progressing   Problem: Skin Integrity: Goal: Risk for impaired skin integrity will decrease Outcome: Progressing   Problem: Safety: Goal:  Non-violent Restraint(s) Outcome: Progressing   Problem: Education: Goal: Knowledge of disease or condition will improve Outcome: Progressing Goal: Knowledge of the prescribed therapeutic regimen will improve Outcome: Progressing Goal: Individualized Educational Video(s) Outcome: Progressing   Problem: Activity: Goal: Ability to tolerate increased activity will improve Outcome: Progressing Goal: Will verbalize the importance of balancing activity with adequate rest periods Outcome: Progressing   Problem: Respiratory: Goal: Ability to maintain a clear airway will improve Outcome: Progressing Goal: Levels of oxygenation will improve Outcome: Progressing Goal: Ability to maintain adequate ventilation will improve Outcome: Progressing   Problem: Education: Goal: Knowledge of disease and its progression will improve Outcome: Progressing Goal: Individualized Educational Video(s) Outcome: Progressing   Problem: Fluid Volume: Goal: Compliance with measures to maintain balanced fluid volume will improve Outcome: Progressing   Problem: Health Behavior/Discharge Planning: Goal: Ability to manage health-related needs will improve Outcome: Progressing   Problem: Nutritional: Goal: Ability to make healthy dietary choices will improve Outcome: Progressing   Problem: Clinical Measurements: Goal: Complications related to the disease process, condition or treatment will be avoided or minimized Outcome: Progressing

## 2024-01-06 DIAGNOSIS — J181 Lobar pneumonia, unspecified organism: Secondary | ICD-10-CM | POA: Diagnosis not present

## 2024-01-06 DIAGNOSIS — J441 Chronic obstructive pulmonary disease with (acute) exacerbation: Secondary | ICD-10-CM | POA: Diagnosis not present

## 2024-01-06 DIAGNOSIS — N1832 Chronic kidney disease, stage 3b: Secondary | ICD-10-CM | POA: Diagnosis not present

## 2024-01-06 LAB — BASIC METABOLIC PANEL
Anion gap: 7 (ref 5–15)
BUN: 28 mg/dL — ABNORMAL HIGH (ref 8–23)
CO2: 34 mmol/L — ABNORMAL HIGH (ref 22–32)
Calcium: 8.5 mg/dL — ABNORMAL LOW (ref 8.9–10.3)
Chloride: 95 mmol/L — ABNORMAL LOW (ref 98–111)
Creatinine, Ser: 1.61 mg/dL — ABNORMAL HIGH (ref 0.61–1.24)
GFR, Estimated: 42 mL/min — ABNORMAL LOW (ref >60)
Glucose, Bld: 114 mg/dL — ABNORMAL HIGH (ref 70–99)
Potassium: 4.4 mmol/L (ref 3.5–5.1)
Sodium: 136 mmol/L (ref 135–145)

## 2024-01-06 LAB — MAGNESIUM: Magnesium: 2.2 mg/dL (ref 1.7–2.4)

## 2024-01-06 LAB — BRAIN NATRIURETIC PEPTIDE: B Natriuretic Peptide: 102 pg/mL — ABNORMAL HIGH (ref 0.0–100.0)

## 2024-01-06 LAB — GLUCOSE, CAPILLARY: Glucose-Capillary: 109 mg/dL — ABNORMAL HIGH (ref 70–99)

## 2024-01-06 MED ORDER — APIXABAN 2.5 MG PO TABS
2.5000 mg | ORAL_TABLET | Freq: Two times a day (BID) | ORAL | Status: DC
  Administered 2024-01-06 – 2024-01-07 (×3): 2.5 mg via ORAL
  Filled 2024-01-06 (×3): qty 1

## 2024-01-06 MED ORDER — MELATONIN 3 MG PO TABS: 6.0000 mg | ORAL_TABLET | Freq: Every day | ORAL | Status: AC

## 2024-01-06 MED ORDER — ENSURE ENLIVE PO LIQD: 237.0000 mL | Freq: Two times a day (BID) | ORAL | Status: AC

## 2024-01-06 MED ORDER — IPRATROPIUM-ALBUTEROL 0.5-2.5 (3) MG/3ML IN SOLN
3.0000 mL | Freq: Three times a day (TID) | RESPIRATORY_TRACT | Status: DC
  Administered 2024-01-06 (×3): 3 mL via RESPIRATORY_TRACT
  Filled 2024-01-06 (×4): qty 3

## 2024-01-06 MED ORDER — DILTIAZEM HCL ER COATED BEADS 180 MG PO CP24: 180.0000 mg | ORAL_CAPSULE | Freq: Every day | ORAL | Status: DC

## 2024-01-06 MED ORDER — AMIODARONE HCL 400 MG PO TABS: 400.0000 mg | ORAL_TABLET | Freq: Two times a day (BID) | ORAL | Status: DC

## 2024-01-06 MED ORDER — APIXABAN 2.5 MG PO TABS: 2.5000 mg | ORAL_TABLET | Freq: Two times a day (BID) | ORAL | Status: DC

## 2024-01-06 MED ORDER — BUDESONIDE 0.5 MG/2ML IN SUSP
0.5000 mg | Freq: Two times a day (BID) | RESPIRATORY_TRACT | Status: DC
  Administered 2024-01-06 (×2): 0.5 mg via RESPIRATORY_TRACT
  Filled 2024-01-06 (×3): qty 2

## 2024-01-06 MED ORDER — GERHARDT'S BUTT CREAM
TOPICAL_CREAM | Freq: Three times a day (TID) | CUTANEOUS | Status: DC
Start: 2024-01-06 — End: 2024-01-07
  Administered 2024-01-06: 1 via TOPICAL
  Filled 2024-01-06: qty 60

## 2024-01-06 MED ORDER — ARFORMOTEROL TARTRATE 15 MCG/2ML IN NEBU
15.0000 ug | INHALATION_SOLUTION | Freq: Two times a day (BID) | RESPIRATORY_TRACT | Status: DC
  Administered 2024-01-06 (×2): 15 ug via RESPIRATORY_TRACT
  Filled 2024-01-06 (×3): qty 2

## 2024-01-06 NOTE — Progress Notes (Signed)
 Pt awake, alert, oriented to person and place only. Pt up x1 assist OOB to chair. Pt able to bear weight well, reminded to stand tall. Shuffling, unsteady gait from bed to chair. Pt able to perform own oral care/toothbrushing and face wash with items provided by staff. Pt currently eating breakfast after tray set. No difficulty with swallowing noted. Wife at bedside. Chair alarm on for safety. Advised wife to call for needs, states understanding.

## 2024-01-06 NOTE — Progress Notes (Signed)
   01/06/24 1247  Vitals  Temp 98.8 F (37.1 C)  Temp Source Rectal  BP (!) 109/59  MAP (mmHg) 70  BP Location Left Arm  BP Method Automatic  Patient Position (if appropriate) Lying  Pulse Rate 69  Resp 19  MEWS COLOR  MEWS Score Color Green  Oxygen Therapy  SpO2 91 %  O2 Device Nasal Cannula  O2 Flow Rate (L/min) (S)  4 L/min (pt more confused, pulled O2 out, SaO2 reading in 82-85%. No improvement when replaced on 2lpm [place], so increased to 4lpm and SaO2 up to 91%)  MEWS Score  MEWS Temp 0  MEWS Systolic 0  MEWS Pulse 0  MEWS RR 0  MEWS LOC 0  MEWS Score 0   Called to pt's room by wife. She states that pt has been sleeping and that he woke up and is very confused and restless. Pt is lying on left side in bed, wringing hands and pulling O2 [place] off. Pt is resistive to care and will swipe at wife and staff when we attempted to replace [place] and get VS. Pt does respond to name called, can state name and DOB but definitely not as oriented as he was earlier this am. Finally able to get VS, readings as above. This nurse squatted down at side of bed at eye level with pt and while holding his hands, began to ask him questions about card games (as he was playing cards with his wife this am after breakfast) and about his grandson. Pt answered those questions appropriately but remains restless and pulling at tubes. Offered pt juice to drink, he was able to hold cup and drink without assistance. After approx 10 minutes, pt more alert, remains restless but leaves O2 [place] in nares and is not resistive to staff or wife. Repositioned in bed for comfort. Pt denies any needs at present, refuses lunch meal at this time. Advised wife to call this nurse if restlessness increased. Call bell within reach.

## 2024-01-06 NOTE — Discharge Summary (Signed)
 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 Presenting today with progressive shortness of breath, hypoxia on 2-4 L satting 88%... Improved to 90% on 6 L, also complaining of palpitation and dizziness.   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 P.o. diltiazem  was reintroduced to help with heart rates. Ultimately, patient converted to sinus rhythm.  His amiodarone  was converted to po and his diltiazem  was converted to long acting. The patient remained in sinus rhythm or, rate controlled. For the remainder  of the hospitalization, the patient did experience episodes of hospital delirium and sundowning.  However, the patient could be redirectable with reorientation.  The patient finished 7 days of antibiotics for his pneumonia.  He remained afebrile and hemodynamically stable. Regarding his respiratory failure, the patient did not experience any further shortness of breath or increased work of breathing. The patient's oxygen saturation did fluctuate between 90-94% on 4 L nasal cannula.  There was some difficulty obtaining a correct oxygen saturation in part due to the patient's tremor, but the patient never had any increased work of breathing.  He was continued on bronchodilators.  Assessment and Plan:  Sepsis -Present on admission -Secondary to pneumonia -12/29/2023 CTA chest negative PE, positive multilobar pneumonia, 4.7 cm ascending thoracic aneurysm -Patient was started on ceftriaxone  and azithromycin  -Sepsis physiology overall improved   Lobar pneumonia -PCT 0.45 -MRSA screen negative -finished 7 days antibiotics on 01/05/24 -COVID-19 PCR negative -Blood cultures negative -Patient remained afebrile hemodynamically stable without any increased work of breathing.   Acute on chronic respiratory failure with hypoxia and hypercarbia -Initially on 6 L -Secondary to pneumonia and COPD exacerbation -Previously on 2-3 L nasal cannula at home -Currently stable on 4L -Continue bronchodilators -finished steroid burst -no further wheezing -1/9--personally reviewed CXR--mild increased interstitial markings, improving bibasilar opacities -aim for oxygen saturation >90% -01/03/23 ABG--7.46/50/59/36 -There were fluctuations in the patient's oxygen saturation in part due to his tremors.  However saturations remained between 90-94% without any increased work of breathing.   Paroxysmal atrial fibrillation -Appreciate cardiology -1/9--converted to sinus early am>>remains in sinus -Continue IV  amiodarone >>po amio on 1/9>>continue amiodarone   400 mg x 3 more days, then 200 mg bid x 14 days, then 200 mg dialy -Continue diltiazem >>consolidate to long acting diltiazem  -Continue apixaban  -01/03/24 echo--EF 65-70%, no WMA, normal RVF, mild TR -remains in sinus on the day of d/c   Acute heart failure with preserved EF -Patient was dosed with IV furosemide  01/01/2024 -net neg 5.1 L -01/03/24 echo--EF 65-70%, no WMA, normal RVF, mild TR -redose IV lasix  1/10 x 1 with increase oxygen demand and increase crackles on exam -Patient remained clinically euvolemic thereafter   CKD stage IIIb -Baseline creatinine 1.5-1.8 -Serum creatinine 1.81 at the time of discharge   Mixed hyperlipidemia -Continue statin   Thoracic ascending aortic aneurysm (HCC) -Chronic history of thoracic ascending aortic aneurysm -Based on today's CTA 4.7 cm no changes -Follow-up as an outpatient and monitor closely   Major neurocognitive disorder -Patient experienced some episodes hospital delirium -However, patient was redirectable. -He was noted to be playing cards with his wife on multiple occasions during the hospitalization.          Consultants: cardiology, palliative Procedures performed: none  Disposition: Skilled nursing facility Diet recommendation:  Cardiac diet DISCHARGE MEDICATION: Allergies as of 01/06/2024       Reactions   Flomax [tamsulosin Hcl] Nausea And Vomiting, Other (See Comments)   Pt thought he was dying  DIZZY   Fentanyl  Other (See Comments)   Makes me combative  Pt states he's had it since then and was fine 10-19-23        Medication List     STOP taking these medications    OXYGEN   verapamil  240 MG CR tablet Commonly known as: CALAN -SR       TAKE these medications    acetaminophen  500 MG tablet Commonly known as: TYLENOL  Take 500 mg by mouth in the morning and at bedtime.   amiodarone  400 MG tablet Commonly known as: PACERONE  Take 1 tablet (400 mg  total) by mouth 2 (two) times daily. X 3 days, then 200 mg bid x 14 days, then 200 mg daily   apixaban  2.5 MG Tabs tablet Commonly known as: ELIQUIS  Take 1 tablet (2.5 mg total) by mouth 2 (two) times daily. What changed:  medication strength how much to take   atorvastatin  20 MG tablet Commonly known as: LIPITOR TAKE 1 TABLET BY MOUTH DAILY. What changed: how much to take   budesonide -formoterol  160-4.5 MCG/ACT inhaler Commonly known as: SYMBICORT  INHALE 2 PUFFS INTO THE LUNGS TWICE DAILY.   cetirizine 10 MG tablet Commonly known as: ZYRTEC Take 10 mg by mouth daily.   diltiazem  180 MG 24 hr capsule Commonly known as: CARDIZEM  CD Take 1 capsule (180 mg total) by mouth daily. Start taking on: January 07, 2024   feeding supplement Liqd Take 237 mLs by mouth 2 (two) times daily between meals.   finasteride  5 MG tablet Commonly known as: PROSCAR  Take 5 mg by mouth in the morning.   ipratropium-albuterol  0.5-2.5 (3) MG/3ML Soln Commonly known as: DUONEB Take 3 mLs by nebulization every 6 (six) hours as needed (as needed for shortness of breath or wheezing).   Magnesium  250 MG Tabs Take 250 mg by mouth in the morning and at bedtime.   melatonin 3 MG Tabs tablet Take 2 tablets (6 mg total) by mouth at bedtime.   sertraline  50 MG tablet Commonly known as: ZOLOFT  Take 50 mg by mouth every morning.   Ventolin  HFA 108 (90 Base) MCG/ACT inhaler Generic drug: albuterol  Inhale 2 puffs into  the lungs Every 4 hours as needed for wheezing or shortness of breath.   Vitamin D  50 MCG (2000 UT) tablet Take 2,000 Units by mouth in the morning.        Contact information for follow-up providers     [facility] Follow up.   Why: They will call to schedule first home visit. Contact information: [address] [facility] [place]  [address] [phone]        Lucien Orren SAILOR, PA-C Follow up.   Specialty: Cardiology Why: Cardiology Hospital Follow-up on  01/24/2024 at 10:05 AM. Contact information: [address] [place] [address] [phone]              Contact information for after-discharge care     Destination     [facility] Preferred SNF .   Service: Skilled Nursing Contact information: [address] [place]  [address] [phone]                    Discharge Exam: Filed Weights   01/01/24 0500 01/05/24 0500 01/06/24 0527  Weight: 89.6 kg 84.7 kg 84.7 kg   HEENT:  [place]/AT, No thrush, no icterus CV:  RRR, no rub, no S3, no S4 Lung: diminished BS.  Fine basilar rales.  No wheeze Abd:  soft/+BS, NT Ext:  No edema, no lymphangitis, no synovitis, no rash   Condition at discharge: stable  The results of significant diagnostics from this hospitalization (including imaging, microbiology, ancillary and laboratory) are listed below for reference.   Imaging Studies: ECHOCARDIOGRAM COMPLETE Result Date: 01/03/2024    ECHOCARDIOGRAM REPORT   Patient Name:   CAROLD EISNER Date of Exam: 01/03/2024 Medical Rec #:  [id number]       Height:       72.0 in Accession #:    [id number]      Weight:       197.5 lb Date of Birth:  12/08/1941        BSA:          2.119 m Patient Age:    82 years        BP:           133/65 mmHg Patient Gender: M               HR:           69 bpm. Exam Location:  [facility] Procedure: 2D Echo, Cardiac Doppler and Color Doppler Indications:    Atrial Fibrillation I48.91  History:        Patient has prior history of Echocardiogram examinations, most                 recent 07/27/2015. COPD and Stroke, Arrythmias:Atrial                 Fibrillation; Risk Factors:Dyslipidemia. Advanced dementia                 (HCC).  Sonographer:    Aida Pizza RCS Referring Phys: [id number] DORN FALCON BRANCH IMPRESSIONS  1. Left ventricular ejection fraction, by estimation, is 65 to 70%. The left ventricle has normal function. The left ventricle has no regional wall motion  abnormalities. There is mild left ventricular hypertrophy. Left ventricular diastolic parameters are indeterminate.  2. Right ventricular systolic function is normal. The right ventricular size is normal. There is normal pulmonary artery systolic pressure.  3. Left atrial size was mildly dilated.  4. The mitral  valve is normal in structure. No evidence of mitral valve regurgitation. No evidence of mitral stenosis.  5. The tricuspid valve is abnormal.  6. The aortic valve is tricuspid. There is moderate calcification of the aortic valve. There is moderate thickening of the aortic valve. Aortic valve regurgitation is not visualized. No aortic stenosis is present.  7. The inferior vena cava is normal in size with greater than 50% respiratory variability, suggesting right atrial pressure of 3 mmHg. FINDINGS  Left Ventricle: Left ventricular ejection fraction, by estimation, is 65 to 70%. The left ventricle has normal function. The left ventricle has no regional wall motion abnormalities. The left ventricular internal cavity size was normal in size. There is  mild left ventricular hypertrophy. Left ventricular diastolic parameters are indeterminate. Right Ventricle: The right ventricular size is normal. Right vetricular wall thickness was not well visualized. Right ventricular systolic function is normal. There is normal pulmonary artery systolic pressure. The tricuspid regurgitant velocity is 2.57 m/s, and with an assumed right atrial pressure of 3 mmHg, the estimated right ventricular systolic pressure is 29.4 mmHg. Left Atrium: Left atrial size was mildly dilated. Right Atrium: Right atrial size was normal in size. Pericardium: There is no evidence of pericardial effusion. Mitral Valve: The mitral valve is normal in structure. No evidence of mitral valve regurgitation. No evidence of mitral valve stenosis. Tricuspid Valve: The tricuspid valve is abnormal. Tricuspid valve regurgitation is mild . No evidence of  tricuspid stenosis. Aortic Valve: The aortic valve is tricuspid. There is moderate calcification of the aortic valve. There is moderate thickening of the aortic valve. There is moderate aortic valve annular calcification. Aortic valve regurgitation is not visualized. No aortic stenosis is present. Aortic valve mean gradient measures 8.0 mmHg. Aortic valve peak gradient measures 15.5 mmHg. Aortic valve area, by VTI measures 1.99 cm. Pulmonic Valve: The pulmonic valve was not well visualized. Pulmonic valve regurgitation is not visualized. No evidence of pulmonic stenosis. Aorta: The aortic root is normal in size and structure. Venous: The inferior vena cava is normal in size with greater than 50% respiratory variability, suggesting right atrial pressure of 3 mmHg. IAS/Shunts: No atrial level shunt detected by color flow Doppler.  LEFT VENTRICLE PLAX 2D LVIDd:         4.60 cm   Diastology LVIDs:         2.10 cm   LV e' medial:    7.40 cm/s LV PW:         1.10 cm   LV E/e' medial:  9.6 LV IVS:        1.10 cm   LV e' lateral:   8.05 cm/s LVOT diam:     1.80 cm   LV E/e' lateral: 8.8 LV SV:         79 LV SV Index:   37 LVOT Area:     2.54 cm  RIGHT VENTRICLE RV S prime:     20.80 cm/s TAPSE (M-mode): 2.6 cm LEFT ATRIUM             Index        RIGHT ATRIUM           Index LA diam:        2.80 cm 1.32 cm/m   RA Area:     25.20 cm LA Vol (A2C):   77.7 ml 36.66 ml/m  RA Volume:   76.70 ml  36.19 ml/m LA Vol (A4C):   84.0 ml 39.63 ml/m LA Biplane Vol:  88.6 ml 41.81 ml/m  AORTIC VALVE AV Area (Vmax):    2.08 cm AV Area (Vmean):   2.06 cm AV Area (VTI):     1.99 cm AV Vmax:           197.00 cm/s AV Vmean:          126.000 cm/s AV VTI:            0.397 m AV Peak Grad:      15.5 mmHg AV Mean Grad:      8.0 mmHg LVOT Vmax:         161.00 cm/s LVOT Vmean:        102.000 cm/s LVOT VTI:          0.310 m LVOT/AV VTI ratio: 0.78  AORTA Ao Root diam: 2.90 cm MITRAL VALVE               TRICUSPID VALVE MV Area (PHT): 2.83 cm     TR Peak grad:   26.4 mmHg MV Decel Time: 268 msec    TR Vmax:        257.00 cm/s MV E velocity: 70.70 cm/s MV A velocity: 65.20 cm/s  SHUNTS MV E/A ratio:  1.08        Systemic VTI:  0.31 m                            Systemic Diam: 1.80 cm Dorn Ross MD Electronically signed by Dorn Ross MD Signature Date/Time: 01/03/2024/12:08:52 PM    Final    DG Chest Port 1 View Result Date: 01/03/2024 CLINICAL DATA:  Dyspnea. EXAM: PORTABLE CHEST 1 VIEW COMPARISON:  December 29, 2023. FINDINGS: Stable cardiomediastinal silhouette. Mildly improved bibasilar interstitial densities are noted suggesting improving edema or atypical inflammation. Small left pleural effusion may be present. Bony thorax is unremarkable. IMPRESSION: Improved bibasilar opacities as noted above. Electronically Signed   By: Lynwood Landy Raddle M.D.   On: 01/03/2024 10:38   CT Angio Chest PE W and/or Wo Contrast Result Date: 12/29/2023 CLINICAL DATA:  83 year old male with history of shortness of breath. Low oxygen saturation. Evaluate for pulmonary embolism. EXAM: CT ANGIOGRAPHY CHEST WITH CONTRAST TECHNIQUE: Multidetector CT imaging of the chest was performed using the standard protocol during bolus administration of intravenous contrast. Multiplanar CT image reconstructions and MIPs were obtained to evaluate the vascular anatomy. RADIATION DOSE REDUCTION: This exam was performed according to the departmental dose-optimization program which includes automated exposure control, adjustment of the mA and/or kV according to patient size and/or use of iterative reconstruction technique. CONTRAST:  60mL OMNIPAQUE  IOHEXOL  350 MG/ML SOLN COMPARISON:  Chest CT 11/02/2023. FINDINGS: Comment: Today's study is severely limited for assessment of pulmonary embolism by suboptimal contrast bolus, excessive image artifact from the patient being imaged with the arms in the down position, and extensive patient respiratory motion. Cardiovascular: Within the  limitations of today's examination, there is no central or lobar sized filling defect to suggest large pulmonary embolism. Unfortunately, secondary to the limitations of today's examination, segmental and subsegmental sized filling defects can not entirely be excluded. Heart size is normal. There is no significant pericardial fluid, thickening or pericardial calcification. There is aortic atherosclerosis, as well as atherosclerosis of the great vessels of the mediastinum and the coronary arteries, including calcified atherosclerotic plaque in the left main, left anterior descending, left circumflex and right coronary arteries. Thickening and calcification of the aortic valve. Aneurysmal dilatation of the ascending  thoracic aorta (4.7 cm in diameter), unchanged. Mediastinum/Nodes: No pathologically enlarged mediastinal or hilar lymph nodes. Esophagus is unremarkable in appearance. No axillary lymphadenopathy. Lungs/Pleura: Assessment of the lung parenchyma is substantially limited by patient respiratory motion. However, there are clearly widespread areas of thickening of the peribronchovascular interstitium with extensive peribronchovascular ground-glass attenuation and consolidative changes scattered throughout the lungs bilaterally, indicative of widespread bilateral multilobar bronchopneumonia, most severe in the right upper and right lower lobes. No pleural effusions. Upper Abdomen: Aortic atherosclerosis. Exophytic 3 cm low-attenuation lesion in the upper pole of the left kidney, incompletely characterized on today's noncontrast CT examination, but statistically likely cysts (no imaging follow-up recommended). Numerous calcified granulomas in the spleen incidentally noted. Musculoskeletal: There are no aggressive appearing lytic or blastic lesions noted in the visualized portions of the skeleton. Review of the MIP images confirms the above findings. IMPRESSION: 1. Severely limited examination demonstrating no  central or lobar sized pulmonary embolism. Accurate assessment for smaller segmental and subsegmental sized emboli is not possible on the basis of today's examination. 2. Severe multilobar bilateral bronchopneumonia, as above. 3. Aortic atherosclerosis, in addition to left main and three-vessel coronary artery disease. Aneurysmal dilatation of the ascending thoracic aorta (4.7 cm in diameter), similar to the prior study. Ascending thoracic aortic aneurysm. Recommend semi-annual imaging followup by CTA or MRA and referral to cardiothoracic surgery if not already obtained. This recommendation follows 2010 ACCF/AHA/AATS/ACR/ASA/SCA/SCAI/SIR/STS/SVM Guidelines for the Diagnosis and Management of Patients With Thoracic Aortic Disease. Circulation. 2010; 121: Z733-z630. Aortic aneurysm NOS (ICD10-I71.9). Aortic Atherosclerosis (ICD10-I70.0). Aortic aneurysm NOS (ICD10-I71.9). Electronically Signed   By: Toribio Aye M.D.   On: 12/29/2023 07:13   DG Chest Portable 1 View Result Date: 12/29/2023 CLINICAL DATA:  Shortness of breath, palpitations EXAM: PORTABLE CHEST 1 VIEW COMPARISON:  10/19/2023 FINDINGS: Heart and mediastinal contours within normal limits. Loop recorder device in place. Diffuse interstitial prominence in the lower lobes. No effusions or acute bony abnormality. IMPRESSION: Diffuse interstitial prominence in the lower lobes could reflect edema or infection. Electronically Signed   By: Franky Crease M.D.   On: 12/29/2023 03:16    Microbiology: Results for orders placed or performed during the hospital encounter of 12/29/23  Culture, blood (single)     Status: None   Collection Time: 12/29/23  7:48 AM   Specimen: Left Antecubital; Blood  Result Value Ref Range Status   Specimen Description   Final    LEFT ANTECUBITAL BOTTLES DRAWN AEROBIC AND ANAEROBIC   Special Requests   Final    Blood Culture results may not be optimal due to an inadequate volume of blood received in culture bottles    Culture   Final    NO GROWTH 5 DAYS Performed at [facility], [address], [place], [place] [address]    Report Status 01/03/2024 FINAL  Final  Culture, blood (x 2)     Status: None   Collection Time: 12/29/23  8:10 AM   Specimen: BLOOD LEFT HAND  Result Value Ref Range Status   Specimen Description   Final    BLOOD LEFT HAND BOTTLES DRAWN AEROBIC AND ANAEROBIC   Special Requests Blood Culture adequate volume  Final   Culture   Final    NO GROWTH 5 DAYS Performed at [facility], [address] [place], [place] [address]    Report Status 01/03/2024 FINAL  Final  Resp panel by RT-PCR (RSV, Flu A&B, Covid) Anterior Nasal Swab     Status: None   Collection Time:  12/29/23  8:22 AM   Specimen: Anterior Nasal Swab  Result Value Ref Range Status   SARS Coronavirus 2 by RT PCR NEGATIVE NEGATIVE Final    Comment: (NOTE) SARS-CoV-2 target nucleic acids are NOT DETECTED.  The SARS-CoV-2 RNA is generally detectable in upper respiratory specimens during the acute phase of infection. The lowest concentration of SARS-CoV-2 viral copies this assay can detect is 138 copies/mL. A negative result does not preclude SARS-Cov-2 infection and should not be used as the sole basis for treatment or other patient management decisions. A negative result may occur with  improper specimen collection/handling, submission of specimen other than nasopharyngeal swab, presence of viral mutation(s) within the areas targeted by this assay, and inadequate number of viral copies(<138 copies/mL). A negative result must be combined with clinical observations, patient history, and epidemiological information. The expected result is Negative.  Fact Sheet for Patients:  bloggercourse.com  Fact Sheet for Healthcare Providers:  seriousbroker.it  This test is no t yet approved or cleared by the [place]  FDA and  has been authorized for detection and/or diagnosis  of SARS-CoV-2 by FDA under an Emergency Use Authorization (EUA). This EUA will remain  in effect (meaning this test can be used) for the duration of the COVID-19 declaration under Section 564(b)(1) of the Act, 21 U.S.C.section 360bbb-3(b)(1), unless the authorization is terminated  or revoked sooner.       Influenza A by PCR NEGATIVE NEGATIVE Final   Influenza B by PCR NEGATIVE NEGATIVE Final    Comment: (NOTE) The Xpert Xpress SARS-CoV-2/FLU/RSV plus assay is intended as an aid in the diagnosis of influenza from Nasopharyngeal swab specimens and should not be used as a sole basis for treatment. Nasal washings and aspirates are unacceptable for Xpert Xpress SARS-CoV-2/FLU/RSV testing.  Fact Sheet for Patients: bloggercourse.com  Fact Sheet for Healthcare Providers: seriousbroker.it  This test is not yet approved or cleared by the [place]  FDA and has been authorized for detection and/or diagnosis of SARS-CoV-2 by FDA under an Emergency Use Authorization (EUA). This EUA will remain in effect (meaning this test can be used) for the duration of the COVID-19 declaration under Section 564(b)(1) of the Act, 21 U.S.C. section 360bbb-3(b)(1), unless the authorization is terminated or revoked.     Resp Syncytial Virus by PCR NEGATIVE NEGATIVE Final    Comment: (NOTE) Fact Sheet for Patients: bloggercourse.com  Fact Sheet for Healthcare Providers: seriousbroker.it  This test is not yet approved or cleared by the [place]  FDA and has been authorized for detection and/or diagnosis of SARS-CoV-2 by FDA under an Emergency Use Authorization (EUA). This EUA will remain in effect (meaning this test can be used) for the duration of the COVID-19 declaration under Section 564(b)(1) of the Act, 21 U.S.C. section 360bbb-3(b)(1), unless the authorization is terminated  or revoked.  Performed at [facility], [address] [place], [place] [address]   MRSA Next Gen by PCR, Nasal     Status: None   Collection Time: 12/29/23  1:13 PM   Specimen: Nasal Mucosa; Nasal Swab  Result Value Ref Range Status   MRSA by PCR Next Gen NOT DETECTED NOT DETECTED Final    Comment: (NOTE) The GeneXpert MRSA Assay (FDA approved for NASAL specimens only), is one component of a comprehensive MRSA colonization surveillance program. It is not intended to diagnose MRSA infection nor to guide or monitor treatment for MRSA infections. Test performance is not FDA approved in patients less than 38 years old. Performed at  [facility], [address], [place], [place] [address]     Labs: CBC: Recent Labs  Lab 12/31/23 0435 01/01/24 0507 01/02/24 0438 01/03/24 0448  WBC 13.8* 14.0* 9.8 10.1  HGB 12.2* 11.8* 12.3* 12.6*  HCT 39.0 37.0* 39.3 39.8  MCV 98.5 98.4 98.0 98.3  PLT [phone] [id number]   Basic Metabolic Panel: Recent Labs  Lab 01/02/24 0456 01/03/24 0448 01/04/24 0442 01/05/24 0422 01/06/24 0455  NA 136 137 137 137 136  K 3.5 3.6 4.2 3.6 4.4  CL 100 100 99 94* 95*  CO2 29 31 31  33* 34*  GLUCOSE 126* 117* 111* 182* 114*  BUN 41* 38* 36* 30* 28*  CREATININE 1.61* 1.49* 1.53* 1.59* 1.61*  CALCIUM  8.2* 8.1* 8.4* 8.6* 8.5*  MG 2.0 2.0 2.1 2.0 2.2   Liver Function Tests: Recent Labs  Lab 12/31/23 0435 01/01/24 0507  AST 39 39  ALT 26 30  ALKPHOS 70 64  BILITOT 0.5 0.5  PROT 6.7 5.9*  ALBUMIN 3.0* 2.5*   CBG: Recent Labs  Lab 01/03/24 0744 01/04/24 0814 01/05/24 0704 01/05/24 1109 01/06/24 0807  GLUCAP 93 96 128* 147* 109*    Discharge time spent: greater than 30 minutes.  Signed: Alm Schneider, MD [facility] 01/06/2024

## 2024-01-06 NOTE — Progress Notes (Signed)
 Pt currently up in recliner playing cards with daughter. Denies c/o at present, oriented to person and place, appropriate conversation. O2 on at present, SaO2 92% on 4lpm [place]. Pt has congested cough but will not cough on command, states, I don't want to. Encouraged cough and deep breathing. Chair alarm on for safety, call bell within reach. Advised daughter to call with any needs.

## 2024-01-06 NOTE — Progress Notes (Signed)
 Notified by pt's daughter that pt has pulled malewick off. Pt less restless than earlier this afternoon, daughter brought pt hand grip exercise balls, which he is now using. Pt more alert, answering questions appropriately. Pt cleaned, linens changed and new male wick applied. Pt with redness and excoriation of skin on penis, scrotum, bilateral groin areas, bilateral inner thighs and buttocks. Ordered cream applied, pt tolerated well.

## 2024-01-06 NOTE — Progress Notes (Signed)
 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 Presenting today with progressive shortness of breath, hypoxia on 2-4 L satting 88%... Improved to 90% on 6 L, also complaining of palpitation and dizziness.   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 P.o. diltiazem  was reintroduced to help with heart rates. Ultimately, patient converted to sinus rhythm.  His amiodarone  was converted to po and his diltiazem  was converted to long acting.   Assessment/Plan:  Sepsis -Present on admission -Secondary to pneumonia -12/29/2023 CTA chest negative PE, positive multilobar pneumonia, 4.7 cm ascending thoracic aneurysm -Patient was started on ceftriaxone  and azithromycin  -Sepsis physiology overall improved   Lobar pneumonia -PCT 0.45 -MRSA screen negative -finished 7 days antibiotics on  01/05/24 -COVID-19 PCR negative -Blood cultures negative   Acute on chronic respiratory failure with hypoxia and hypercarbia -Initially on 6 L -Secondary to pneumonia and COPD exacerbation -Previously on 2-3 L nasal cannula at home -Currently stable on 4L -Continue bronchodilators -finished steroid burst -no further wheezing -1/9--personally reviewed CXR--mild increased interstitial markings, improving bibasilar opacities -aim for oxygen saturation >90% -01/03/23 ABG--7.46/50/59/36   Paroxysmal atrial fibrillation -Appreciate cardiology -1/9--converted to sinus early am>>remains in sinus -Continue IV amiodarone >>po amio on 1/9 -Continue diltiazem >>consolidate to long acting -Continue apixaban  -01/03/24 echo--EF 65-70%, no WMA, normal RVF, mild TR   Acute heart failure with preserved EF -Patient was dosed with IV furosemide  01/01/2024 -net neg 5.1 L -01/03/24 echo--EF 65-70%, no WMA, normal RVF, mild TR -redose IV lasix  1/10 x 1 with increase oxygen demand and increase crackles on exam   CKD stage IIIb -Baseline creatinine 1.5-1.8   Mixed hyperlipidemia -Continue statin   Thoracic ascending aortic aneurysm (HCC) -Chronic history of thoracic ascending aortic aneurysm -Based on today's CTA 4.7 cm no changes -Follow-up as an outpatient and monitor closely   Major neurocognitive disorder -At risk for hospital delirium                   Family Communication: spouse updated 1/12   Consultants:  cardiology   Code Status: DNR   DVT Prophylaxis:  apixaban      Procedures: As Listed in Progress Note Above   Antibiotics: Ceftriaxone  1/4>>1/7 Azithro 1/4>>1/6 Cefdinir  1/8>>1/11 Doxy 1/8>>1/11              Subjective:  Patient denies fevers, chills, headache, chest pain, dyspnea, nausea, vomiting, diarrhea, abdominal pain, dysuria, hematuria, hematochezia, and  melena.  Objective: Vitals:   01/06/24 0527 01/06/24 0900 01/06/24 1004 01/06/24 1016  BP: (!)  114/56 (!) 114/52 (!) 114/52   Pulse: 66 75    Resp: 20 20    Temp: 98.9 F (37.2 C)     TempSrc: Oral     SpO2: 91% 92%  90%  Weight: 84.7 kg     Height:        Intake/Output Summary (Last 24 hours) at 01/06/2024 1110 Last data filed at 01/06/2024 1005 Gross per 24 hour  Intake 243 ml  Output 600 ml  Net -357 ml   Weight change: 0 kg Exam:  General:  Pt is alert, follows commands appropriately, not in acute distress HEENT: No icterus, No thrush, No neck mass, [place]/AT Cardiovascular: RRR, S1/S2, no rubs, no gallops Respiratory: bibasilar rales.  No wheeze Abdomen: Soft/+BS, non tender, non distended, no guarding Extremities: No edema, No lymphangitis, No petechiae, No rashes, no synovitis   Data Reviewed: I have personally reviewed following labs and imaging studies Basic Metabolic Panel: Recent Labs  Lab 01/02/24 0456 01/03/24 0448 01/04/24 0442 01/05/24 0422 01/06/24 0455  NA 136 137 137 137 136  K 3.5 3.6 4.2 3.6 4.4  CL 100 100 99 94* 95*  CO2 29 31 31  33* 34*  GLUCOSE 126* 117* 111* 182* 114*  BUN 41* 38* 36* 30* 28*  CREATININE 1.61* 1.49* 1.53* 1.59* 1.61*  CALCIUM  8.2* 8.1* 8.4* 8.6* 8.5*  MG 2.0 2.0 2.1 2.0 2.2   Liver Function Tests: Recent Labs  Lab 12/31/23 0435 01/01/24 0507  AST 39 39  ALT 26 30  ALKPHOS 70 64  BILITOT 0.5 0.5  PROT 6.7 5.9*  ALBUMIN 3.0* 2.5*   No results for input(s): LIPASE, AMYLASE in the last 168 hours. No results for input(s): AMMONIA in the last 168 hours. Coagulation Profile: No results for input(s): INR, PROTIME in the last 168 hours. CBC: Recent Labs  Lab 12/31/23 0435 01/01/24 0507 01/02/24 0438 01/03/24 0448  WBC 13.8* 14.0* 9.8 10.1  HGB 12.2* 11.8* 12.3* 12.6*  HCT 39.0 37.0* 39.3 39.8  MCV 98.5 98.4 98.0 98.3  PLT 334 352 365 351   Cardiac Enzymes: No results for input(s): CKTOTAL, CKMB, CKMBINDEX, TROPONINI in the last 168 hours. BNP: Invalid input(s): POCBNP CBG: Recent  Labs  Lab 01/03/24 0744 01/04/24 0814 01/05/24 0704 01/05/24 1109 01/06/24 0807  GLUCAP 93 96 128* 147* 109*   HbA1C: No results for input(s): HGBA1C in the last 72 hours. Urine analysis:    Component Value Date/Time   COLORURINE YELLOW 06/13/2016 1120   APPEARANCEUR Clear 12/04/2023 1539   LABSPEC 1.020 06/13/2016 1120   PHURINE 5.5 06/13/2016 1120   GLUCOSEU Negative 12/04/2023 1539   HGBUR NEGATIVE 06/13/2016 1120   BILIRUBINUR Negative 12/04/2023 1539   KETONESUR NEGATIVE 06/13/2016 1120   PROTEINUR Negative 12/04/2023 1539   PROTEINUR NEGATIVE 06/13/2016 1120   UROBILINOGEN negative (A) 05/04/2020 0930   NITRITE Negative 12/04/2023 1539   NITRITE NEGATIVE 06/13/2016 1120   LEUKOCYTESUR Trace (A) 12/04/2023 1539   Sepsis Labs: @LABRCNTIP (procalcitonin:4,lacticidven:4) ) Recent Results (from the past 240 hours)  Culture, blood (single)     Status: None   Collection Time: 12/29/23  7:48 AM   Specimen: Left Antecubital; Blood  Result Value Ref Range Status   Specimen Description   Final    LEFT ANTECUBITAL BOTTLES DRAWN AEROBIC AND ANAEROBIC   Special Requests   Final    Blood Culture results may not be  optimal due to an inadequate volume of blood received in culture bottles   Culture   Final    NO GROWTH 5 DAYS Performed at [facility], [address], [place], [place] [address]    Report Status 01/03/2024 FINAL  Final  Culture, blood (x 2)     Status: None   Collection Time: 12/29/23  8:10 AM   Specimen: BLOOD LEFT HAND  Result Value Ref Range Status   Specimen Description   Final    BLOOD LEFT HAND BOTTLES DRAWN AEROBIC AND ANAEROBIC   Special Requests Blood Culture adequate volume  Final   Culture   Final    NO GROWTH 5 DAYS Performed at [facility], [address], [place], [place] [address]    Report Status 01/03/2024 FINAL  Final  Resp panel by RT-PCR (RSV, Flu A&B, Covid) Anterior Nasal Swab     Status: None   Collection Time: 12/29/23  8:22 AM    Specimen: Anterior Nasal Swab  Result Value Ref Range Status   SARS Coronavirus 2 by RT PCR NEGATIVE NEGATIVE Final    Comment: (NOTE) SARS-CoV-2 target nucleic acids are NOT DETECTED.  The SARS-CoV-2 RNA is generally detectable in upper respiratory specimens during the acute phase of infection. The lowest concentration of SARS-CoV-2 viral copies this assay can detect is 138 copies/mL. A negative result does not preclude SARS-Cov-2 infection and should not be used as the sole basis for treatment or other patient management decisions. A negative result may occur with  improper specimen collection/handling, submission of specimen other than nasopharyngeal swab, presence of viral mutation(s) within the areas targeted by this assay, and inadequate number of viral copies(<138 copies/mL). A negative result must be combined with clinical observations, patient history, and epidemiological information. The expected result is Negative.  Fact Sheet for Patients:  bloggercourse.com  Fact Sheet for Healthcare Providers:  seriousbroker.it  This test is no t yet approved or cleared by the [place]  FDA and  has been authorized for detection and/or diagnosis of SARS-CoV-2 by FDA under an Emergency Use Authorization (EUA). This EUA will remain  in effect (meaning this test can be used) for the duration of the COVID-19 declaration under Section 564(b)(1) of the Act, 21 U.S.C.section 360bbb-3(b)(1), unless the authorization is terminated  or revoked sooner.       Influenza A by PCR NEGATIVE NEGATIVE Final   Influenza B by PCR NEGATIVE NEGATIVE Final    Comment: (NOTE) The Xpert Xpress SARS-CoV-2/FLU/RSV plus assay is intended as an aid in the diagnosis of influenza from Nasopharyngeal swab specimens and should not be used as a sole basis for treatment. Nasal washings and aspirates are unacceptable for Xpert Xpress  SARS-CoV-2/FLU/RSV testing.  Fact Sheet for Patients: bloggercourse.com  Fact Sheet for Healthcare Providers: seriousbroker.it  This test is not yet approved or cleared by the [place]  FDA and has been authorized for detection and/or diagnosis of SARS-CoV-2 by FDA under an Emergency Use Authorization (EUA). This EUA will remain in effect (meaning this test can be used) for the duration of the COVID-19 declaration under Section 564(b)(1) of the Act, 21 U.S.C. section 360bbb-3(b)(1), unless the authorization is terminated or revoked.     Resp Syncytial Virus by PCR NEGATIVE NEGATIVE Final    Comment: (NOTE) Fact Sheet for Patients: bloggercourse.com  Fact Sheet for Healthcare Providers: seriousbroker.it  This test is not yet approved or cleared by the [place]  FDA and has been authorized for detection and/or diagnosis of SARS-CoV-2 by  FDA under an Emergency Use Authorization (EUA). This EUA will remain in effect (meaning this test can be used) for the duration of the COVID-19 declaration under Section 564(b)(1) of the Act, 21 U.S.C. section 360bbb-3(b)(1), unless the authorization is terminated or revoked.  Performed at [facility], [address] [place], [place] [address]   MRSA Next Gen by PCR, Nasal     Status: None   Collection Time: 12/29/23  1:13 PM   Specimen: Nasal Mucosa; Nasal Swab  Result Value Ref Range Status   MRSA by PCR Next Gen NOT DETECTED NOT DETECTED Final    Comment: (NOTE) The GeneXpert MRSA Assay (FDA approved for NASAL specimens only), is one component of a comprehensive MRSA colonization surveillance program. It is not intended to diagnose MRSA infection nor to guide or monitor treatment for MRSA infections. Test performance is not FDA approved in patients less than 76 years old. Performed at [facility], [address], [place],  [place] [address]      Scheduled Meds:  amiodarone   400 mg Oral BID   apixaban   2.5 mg Oral BID   arformoterol   15 mcg Nebulization BID   atorvastatin   20 mg Oral Daily   budesonide  (PULMICORT ) nebulizer solution  0.5 mg Nebulization BID   diltiazem   180 mg Oral Daily   feeding supplement  237 mL Oral BID BM   finasteride   5 mg Oral q AM   Gerhardt's butt cream   Topical TID   ipratropium-albuterol   3 mL Nebulization Q8H   melatonin  6 mg Oral QHS   multivitamin with minerals  1 tablet Oral Daily   sertraline   50 mg Oral q morning   sodium chloride  flush  3 mL Intravenous Q12H   sodium chloride  flush  3 mL Intravenous Q12H   traZODone   50 mg Oral QHS   Continuous Infusions:  Procedures/Studies: ECHOCARDIOGRAM COMPLETE Result Date: 01/03/2024    ECHOCARDIOGRAM REPORT   Patient Name:   Kyle Owen Date of Exam: 01/03/2024 Medical Rec #:  [id number]       Height:       72.0 in Accession #:    [id number]      Weight:       197.5 lb Date of Birth:  [birth year]        BSA:          2.119 m Patient Age:    82 years        BP:           133/65 mmHg Patient Gender: M               HR:           69 bpm. Exam Location:  [facility] Procedure: 2D Echo, Cardiac Doppler and Color Doppler Indications:    Atrial Fibrillation I48.91  History:        Patient has prior history of Echocardiogram examinations, most                 recent 07/27/2015. COPD and Stroke, Arrythmias:Atrial                 Fibrillation; Risk Factors:Dyslipidemia. Advanced dementia                 (HCC).  Sonographer:    Aida Pizza RCS Referring Phys: [id number] DORN FALCON BRANCH IMPRESSIONS  1. Left ventricular ejection fraction, by estimation, is 65 to 70%. The left ventricle has normal function. The left ventricle  has no regional wall motion abnormalities. There is mild left ventricular hypertrophy. Left ventricular diastolic parameters are indeterminate.  2. Right ventricular systolic function is normal. The right ventricular size is normal.  There is normal pulmonary artery systolic pressure.  3. Left atrial size was mildly dilated.  4. The mitral valve is normal in structure. No evidence of mitral valve regurgitation. No evidence of mitral stenosis.  5. The tricuspid valve is abnormal.  6. The aortic valve is tricuspid. There is moderate calcification of the aortic valve. There is moderate thickening of the aortic valve. Aortic valve regurgitation is not visualized. No aortic stenosis is present.  7. The inferior vena cava is normal in size with greater than 50% respiratory variability, suggesting right atrial pressure of 3 mmHg. FINDINGS  Left Ventricle: Left ventricular ejection fraction, by estimation, is 65 to 70%. The left ventricle has normal function. The left ventricle has no regional wall motion abnormalities. The left ventricular internal cavity size was normal in size. There is  mild left ventricular hypertrophy. Left ventricular diastolic parameters are indeterminate. Right Ventricle: The right ventricular size is normal. Right vetricular wall thickness was not well visualized. Right ventricular systolic function is normal. There is normal pulmonary artery systolic pressure. The tricuspid regurgitant velocity is 2.57 m/s, and with an assumed right atrial pressure of 3 mmHg, the estimated right ventricular systolic pressure is 29.4 mmHg. Left Atrium: Left atrial size was mildly dilated. Right Atrium: Right atrial size was normal in size. Pericardium: There is no evidence of pericardial effusion. Mitral Valve: The mitral valve is normal in structure. No evidence of mitral valve regurgitation. No evidence of mitral valve stenosis. Tricuspid Valve: The tricuspid valve is abnormal. Tricuspid valve regurgitation is mild . No evidence of tricuspid stenosis. Aortic Valve: The aortic valve is tricuspid. There is moderate calcification of the aortic valve. There is moderate thickening of the aortic valve. There is moderate aortic valve annular  calcification. Aortic valve regurgitation is not visualized. No aortic stenosis is present. Aortic valve mean gradient measures 8.0 mmHg. Aortic valve peak gradient measures 15.5 mmHg. Aortic valve area, by VTI measures 1.99 cm. Pulmonic Valve: The pulmonic valve was not well visualized. Pulmonic valve regurgitation is not visualized. No evidence of pulmonic stenosis. Aorta: The aortic root is normal in size and structure. Venous: The inferior vena cava is normal in size with greater than 50% respiratory variability, suggesting right atrial pressure of 3 mmHg. IAS/Shunts: No atrial level shunt detected by color flow Doppler.  LEFT VENTRICLE PLAX 2D LVIDd:         4.60 cm   Diastology LVIDs:         2.10 cm   LV e' medial:    7.40 cm/s LV PW:         1.10 cm   LV E/e' medial:  9.6 LV IVS:        1.10 cm   LV e' lateral:   8.05 cm/s LVOT diam:     1.80 cm   LV E/e' lateral: 8.8 LV SV:         79 LV SV Index:   37 LVOT Area:     2.54 cm  RIGHT VENTRICLE RV S prime:     20.80 cm/s TAPSE (M-mode): 2.6 cm LEFT ATRIUM             Index        RIGHT ATRIUM           Index LA diam:  2.80 cm 1.32 cm/m   RA Area:     25.20 cm LA Vol (A2C):   77.7 ml 36.66 ml/m  RA Volume:   76.70 ml  36.19 ml/m LA Vol (A4C):   84.0 ml 39.63 ml/m LA Biplane Vol: 88.6 ml 41.81 ml/m  AORTIC VALVE AV Area (Vmax):    2.08 cm AV Area (Vmean):   2.06 cm AV Area (VTI):     1.99 cm AV Vmax:           197.00 cm/s AV Vmean:          126.000 cm/s AV VTI:            0.397 m AV Peak Grad:      15.5 mmHg AV Mean Grad:      8.0 mmHg LVOT Vmax:         161.00 cm/s LVOT Vmean:        102.000 cm/s LVOT VTI:          0.310 m LVOT/AV VTI ratio: 0.78  AORTA Ao Root diam: 2.90 cm MITRAL VALVE               TRICUSPID VALVE MV Area (PHT): 2.83 cm    TR Peak grad:   26.4 mmHg MV Decel Time: 268 msec    TR Vmax:        257.00 cm/s MV E velocity: 70.70 cm/s MV A velocity: 65.20 cm/s  SHUNTS MV E/A ratio:  1.08        Systemic VTI:  0.31 m                             Systemic Diam: 1.80 cm Dorn Ross MD Electronically signed by Dorn Ross MD Signature Date/Time: 01/03/2024/12:08:52 PM    Final    DG Chest Port 1 View Result Date: 01/03/2024 CLINICAL DATA:  Dyspnea. EXAM: PORTABLE CHEST 1 VIEW COMPARISON:  December 29, 2023. FINDINGS: Stable cardiomediastinal silhouette. Mildly improved bibasilar interstitial densities are noted suggesting improving edema or atypical inflammation. Small left pleural effusion may be present. Bony thorax is unremarkable. IMPRESSION: Improved bibasilar opacities as noted above. Electronically Signed   By: Lynwood Landy Raddle M.D.   On: 01/03/2024 10:38   CT Angio Chest PE W and/or Wo Contrast Result Date: 12/29/2023 CLINICAL DATA:  83 year old male with history of shortness of breath. Low oxygen saturation. Evaluate for pulmonary embolism. EXAM: CT ANGIOGRAPHY CHEST WITH CONTRAST TECHNIQUE: Multidetector CT imaging of the chest was performed using the standard protocol during bolus administration of intravenous contrast. Multiplanar CT image reconstructions and MIPs were obtained to evaluate the vascular anatomy. RADIATION DOSE REDUCTION: This exam was performed according to the departmental dose-optimization program which includes automated exposure control, adjustment of the mA and/or kV according to patient size and/or use of iterative reconstruction technique. CONTRAST:  60mL OMNIPAQUE  IOHEXOL  350 MG/ML SOLN COMPARISON:  Chest CT 11/02/2023. FINDINGS: Comment: Today's study is severely limited for assessment of pulmonary embolism by suboptimal contrast bolus, excessive image artifact from the patient being imaged with the arms in the down position, and extensive patient respiratory motion. Cardiovascular: Within the limitations of today's examination, there is no central or lobar sized filling defect to suggest large pulmonary embolism. Unfortunately, secondary to the limitations of today's examination, segmental and  subsegmental sized filling defects can not entirely be excluded. Heart size is normal. There is no significant pericardial fluid, thickening or pericardial calcification. There is aortic  atherosclerosis, as well as atherosclerosis of the great vessels of the mediastinum and the coronary arteries, including calcified atherosclerotic plaque in the left main, left anterior descending, left circumflex and right coronary arteries. Thickening and calcification of the aortic valve. Aneurysmal dilatation of the ascending thoracic aorta (4.7 cm in diameter), unchanged. Mediastinum/Nodes: No pathologically enlarged mediastinal or hilar lymph nodes. Esophagus is unremarkable in appearance. No axillary lymphadenopathy. Lungs/Pleura: Assessment of the lung parenchyma is substantially limited by patient respiratory motion. However, there are clearly widespread areas of thickening of the peribronchovascular interstitium with extensive peribronchovascular ground-glass attenuation and consolidative changes scattered throughout the lungs bilaterally, indicative of widespread bilateral multilobar bronchopneumonia, most severe in the right upper and right lower lobes. No pleural effusions. Upper Abdomen: Aortic atherosclerosis. Exophytic 3 cm low-attenuation lesion in the upper pole of the left kidney, incompletely characterized on today's noncontrast CT examination, but statistically likely cysts (no imaging follow-up recommended). Numerous calcified granulomas in the spleen incidentally noted. Musculoskeletal: There are no aggressive appearing lytic or blastic lesions noted in the visualized portions of the skeleton. Review of the MIP images confirms the above findings. IMPRESSION: 1. Severely limited examination demonstrating no central or lobar sized pulmonary embolism. Accurate assessment for smaller segmental and subsegmental sized emboli is not possible on the basis of today's examination. 2. Severe multilobar bilateral  bronchopneumonia, as above. 3. Aortic atherosclerosis, in addition to left main and three-vessel coronary artery disease. Aneurysmal dilatation of the ascending thoracic aorta (4.7 cm in diameter), similar to the prior study. Ascending thoracic aortic aneurysm. Recommend semi-annual imaging followup by CTA or MRA and referral to cardiothoracic surgery if not already obtained. This recommendation follows 2010 ACCF/AHA/AATS/ACR/ASA/SCA/SCAI/SIR/STS/SVM Guidelines for the Diagnosis and Management of Patients With Thoracic Aortic Disease. Circulation. 2010; 121: Z733-z630. Aortic aneurysm NOS (ICD10-I71.9). Aortic Atherosclerosis (ICD10-I70.0). Aortic aneurysm NOS (ICD10-I71.9). Electronically Signed   By: Toribio Aye M.D.   On: 12/29/2023 07:13   DG Chest Portable 1 View Result Date: 12/29/2023 CLINICAL DATA:  Shortness of breath, palpitations EXAM: PORTABLE CHEST 1 VIEW COMPARISON:  10/19/2023 FINDINGS: Heart and mediastinal contours within normal limits. Loop recorder device in place. Diffuse interstitial prominence in the lower lobes. No effusions or acute bony abnormality. IMPRESSION: Diffuse interstitial prominence in the lower lobes could reflect edema or infection. Electronically Signed   By: Franky Crease M.D.   On: 12/29/2023 03:16    Alm Schneider, DO  [facility]  If 7PM-7AM, please contact night-coverage [email or web address] Password [email or web address] 01/06/2024, 11:10 AM   LOS: 8 days

## 2024-01-06 NOTE — Plan of Care (Signed)
  Problem: Fluid Volume: Goal: Hemodynamic stability will improve Outcome: Progressing   Problem: Clinical Measurements: Goal: Diagnostic test results will improve Outcome: Progressing Goal: Signs and symptoms of infection will decrease Outcome: Progressing   Problem: Respiratory: Goal: Ability to maintain adequate ventilation will improve Outcome: Progressing   Problem: Education: Goal: Knowledge of General Education information will improve Description: Including pain rating scale, medication(s)/side effects and non-pharmacologic comfort measures Outcome: Progressing   Problem: Health Behavior/Discharge Planning: Goal: Ability to manage health-related needs will improve Outcome: Progressing   Problem: Clinical Measurements: Goal: Ability to maintain clinical measurements within normal limits will improve Outcome: Progressing Goal: Will remain free from infection Outcome: Progressing Goal: Diagnostic test results will improve Outcome: Progressing Goal: Respiratory complications will improve Outcome: Progressing Goal: Cardiovascular complication will be avoided Outcome: Progressing   Problem: Activity: Goal: Risk for activity intolerance will decrease Outcome: Progressing   Problem: Nutrition: Goal: Adequate nutrition will be maintained Outcome: Progressing   Problem: Coping: Goal: Level of anxiety will decrease Outcome: Progressing   Problem: Elimination: Goal: Will not experience complications related to bowel motility Outcome: Progressing Goal: Will not experience complications related to urinary retention Outcome: Progressing   Problem: Pain Management: Goal: General experience of comfort will improve Outcome: Progressing   Problem: Safety: Goal: Ability to remain free from injury will improve Outcome: Progressing   Problem: Skin Integrity: Goal: Risk for impaired skin integrity will decrease Outcome: Progressing   Problem: Safety: Goal:  Non-violent Restraint(s) Outcome: Progressing   Problem: Education: Goal: Knowledge of disease or condition will improve Outcome: Progressing Goal: Knowledge of the prescribed therapeutic regimen will improve Outcome: Progressing Goal: Individualized Educational Video(s) Outcome: Progressing   Problem: Activity: Goal: Ability to tolerate increased activity will improve Outcome: Progressing Goal: Will verbalize the importance of balancing activity with adequate rest periods Outcome: Progressing   Problem: Respiratory: Goal: Ability to maintain a clear airway will improve Outcome: Progressing Goal: Levels of oxygenation will improve Outcome: Progressing Goal: Ability to maintain adequate ventilation will improve Outcome: Progressing   Problem: Education: Goal: Knowledge of disease and its progression will improve Outcome: Progressing Goal: Individualized Educational Video(s) Outcome: Progressing   Problem: Fluid Volume: Goal: Compliance with measures to maintain balanced fluid volume will improve Outcome: Progressing   Problem: Health Behavior/Discharge Planning: Goal: Ability to manage health-related needs will improve Outcome: Progressing   Problem: Nutritional: Goal: Ability to make healthy dietary choices will improve Outcome: Progressing   Problem: Clinical Measurements: Goal: Complications related to the disease process, condition or treatment will be avoided or minimized Outcome: Progressing

## 2024-01-06 NOTE — Plan of Care (Signed)
 Patient done very well during the shift. Mitts on bilaterally to protect purewick and nasal cannula. Mitts are not tied. No behavioral issues during the night. Wife at bedside.  Problem: Fluid Volume: Goal: Hemodynamic stability will improve Outcome: Progressing   Problem: Clinical Measurements: Goal: Diagnostic test results will improve Outcome: Progressing Goal: Signs and symptoms of infection will decrease Outcome: Progressing   Problem: Respiratory: Goal: Ability to maintain adequate ventilation will improve Outcome: Progressing   Problem: Health Behavior/Discharge Planning: Goal: Ability to manage health-related needs will improve Outcome: Progressing   Problem: Clinical Measurements: Goal: Ability to maintain clinical measurements within normal limits will improve Outcome: Progressing Goal: Will remain free from infection Outcome: Progressing Goal: Diagnostic test results will improve Outcome: Progressing Goal: Respiratory complications will improve Outcome: Progressing Goal: Cardiovascular complication will be avoided Outcome: Progressing   Problem: Activity: Goal: Risk for activity intolerance will decrease Outcome: Progressing   Problem: Nutrition: Goal: Adequate nutrition will be maintained Outcome: Progressing   Problem: Coping: Goal: Level of anxiety will decrease Outcome: Progressing   Problem: Elimination: Goal: Will not experience complications related to bowel motility Outcome: Progressing Goal: Will not experience complications related to urinary retention Outcome: Progressing   Problem: Pain Management: Goal: General experience of comfort will improve Outcome: Progressing   Problem: Safety: Goal: Ability to remain free from injury will improve Outcome: Progressing   Problem: Skin Integrity: Goal: Risk for impaired skin integrity will decrease Outcome: Progressing   Problem: Safety: Goal: Non-violent Restraint(s) Outcome: Progressing    Problem: Education: Goal: Knowledge of disease or condition will improve Outcome: Progressing Goal: Knowledge of the prescribed therapeutic regimen will improve Outcome: Progressing Goal: Individualized Educational Video(s) Outcome: Progressing   Problem: Activity: Goal: Ability to tolerate increased activity will improve Outcome: Progressing Goal: Will verbalize the importance of balancing activity with adequate rest periods Outcome: Progressing   Problem: Respiratory: Goal: Ability to maintain a clear airway will improve Outcome: Progressing Goal: Levels of oxygenation will improve Outcome: Progressing Goal: Ability to maintain adequate ventilation will improve Outcome: Progressing   Problem: Education: Goal: Knowledge of disease and its progression will improve Outcome: Progressing Goal: Individualized Educational Video(s) Outcome: Progressing   Problem: Fluid Volume: Goal: Compliance with measures to maintain balanced fluid volume will improve Outcome: Progressing   Problem: Health Behavior/Discharge Planning: Goal: Ability to manage health-related needs will improve Outcome: Progressing   Problem: Nutritional: Goal: Ability to make healthy dietary choices will improve Outcome: Progressing   Problem: Clinical Measurements: Goal: Complications related to the disease process, condition or treatment will be avoided or minimized Outcome: Progressing   Problem: Education: Goal: Knowledge of General Education information will improve Description: Including pain rating scale, medication(s)/side effects and non-pharmacologic comfort measures Outcome: Not Progressing

## 2024-01-07 DIAGNOSIS — I4891 Unspecified atrial fibrillation: Secondary | ICD-10-CM | POA: Diagnosis not present

## 2024-01-07 DIAGNOSIS — J181 Lobar pneumonia, unspecified organism: Secondary | ICD-10-CM | POA: Diagnosis not present

## 2024-01-07 DIAGNOSIS — N1832 Chronic kidney disease, stage 3b: Secondary | ICD-10-CM | POA: Diagnosis not present

## 2024-01-07 DIAGNOSIS — J441 Chronic obstructive pulmonary disease with (acute) exacerbation: Secondary | ICD-10-CM | POA: Diagnosis not present

## 2024-01-07 LAB — BASIC METABOLIC PANEL
Anion gap: 9 (ref 5–15)
BUN: 34 mg/dL — ABNORMAL HIGH (ref 8–23)
CO2: 31 mmol/L (ref 22–32)
Calcium: 8.6 mg/dL — ABNORMAL LOW (ref 8.9–10.3)
Chloride: 96 mmol/L — ABNORMAL LOW (ref 98–111)
Creatinine, Ser: 1.81 mg/dL — ABNORMAL HIGH (ref 0.61–1.24)
GFR, Estimated: 37 mL/min — ABNORMAL LOW (ref >60)
Glucose, Bld: 78 mg/dL (ref 70–99)
Potassium: 4.3 mmol/L (ref 3.5–5.1)
Sodium: 136 mmol/L (ref 135–145)

## 2024-01-07 LAB — GLUCOSE, CAPILLARY: Glucose-Capillary: 126 mg/dL — ABNORMAL HIGH (ref 70–99)

## 2024-01-07 NOTE — Care Management Important Message (Signed)
 Important Message  Patient Details  Name: Kyle Owen MRN: [id number] Date of Birth: January 02, 1941   Important Message Given:  Yes - Medicare IM     Corey Harold 01/07/2024, 11:54 AM

## 2024-01-07 NOTE — TOC Transition Note (Signed)
 Transition of Care [facility]) - Discharge Note   Patient Details  Name: Kyle Owen MRN: [id number] Date of Birth: 1941/05/20  Transition of Care [facility]) CM/SW Contact:  Kyle Stabs, RN Phone Number: 01/07/2024, 10:33 AM   Clinical Narrative:    Insurance approved 1/13 - 1/15, next review date 1/15. Kyle Owen id [id number], plan auth id [id number].  Kyle Owen provided a room number, RN called report. CM called his wife and printed med necessity. TOC following to call EMS.   Final next level of care: Skilled Nursing Facility Barriers to Discharge: Barriers Resolved  Patient Goals and CMS Choice Patient states their goals for this hospitalization and ongoing recovery are:: agreeable to SNF CMS Medicare.gov Compare Post Acute Care list provided to:: Patient Represenative (must comment) Choice offered to / list presented to : Adult Children [place] ownership interest in [facility].provided to:: Kyle Owen POA / Guardian (Daughter - Kyle Owen)   Discharge Placement      Patient to be transferred to facility by: EMS   Patient and family notified of of transfer: 01/07/24  Discharge Plan and Services Additional resources added to the After Visit Summary for   In-house Referral: Clinical Social Work         Social Drivers of Health (SDOH) Interventions SDOH Screenings   Food Insecurity: No Food Insecurity (12/29/2023)  Housing: Unknown (12/29/2023)  Transportation Needs: No Transportation Needs (12/29/2023)  Utilities: Not At Risk (12/29/2023)  Social Connections: Socially Integrated (12/29/2023)  Tobacco Use: Medium Risk (12/29/2023)     Readmission Risk Interventions    12/30/2023   12:30 PM  Readmission Risk Prevention Plan  Transportation Screening Complete  Medication Review (RN Care Manager) Complete  HRI or Home Care Consult Complete  SW Recovery Care/Counseling Consult Complete  Palliative Care Screening Not Applicable  Skilled Nursing Facility Not Applicable

## 2024-01-07 NOTE — Plan of Care (Signed)
  Problem: Respiratory: Goal: Ability to maintain adequate ventilation will improve Outcome: Not Progressing   

## 2024-01-07 NOTE — Progress Notes (Signed)
 Pt restless all night with very little sleep (maybe 1 hour). He denies pain. Several times he removed nasal cannula. He was assisted to bedside commode twice with assistance of walker. He remained confused overnight with sometimes worse than others. Daughter at bedside all night and supportive in care. Kellogg RN

## 2024-01-07 NOTE — Progress Notes (Signed)
 Palliative: Chart review completed.  Kyle Owen is to discharge to short-term rehab at [facility] today. Face-to-face discussion with transition of care team.  No needs identified.  No charge Lorenza Birkenhead, NP Palliative medicine team Team phone [phone]

## 2024-01-24 ENCOUNTER — Ambulatory Visit: Payer: Medicare Other | Admitting: Physician Assistant

## 2024-01-29 ENCOUNTER — Encounter (HOSPITAL_COMMUNITY): Payer: Self-pay

## 2024-01-29 ENCOUNTER — Emergency Department (HOSPITAL_COMMUNITY): Payer: Medicare Other

## 2024-01-29 ENCOUNTER — Other Ambulatory Visit: Payer: Self-pay

## 2024-01-29 ENCOUNTER — Inpatient Hospital Stay (HOSPITAL_COMMUNITY)
Admission: EM | Admit: 2024-01-29 | Discharge: 2024-01-31 | DRG: 193 | Disposition: A | Discharge status: Hospice | Payer: Medicare Other | Attending: Internal Medicine | Admitting: Internal Medicine

## 2024-01-29 DIAGNOSIS — F0284 Dementia in other diseases classified elsewhere, unspecified severity, with anxiety: Secondary | ICD-10-CM | POA: Diagnosis present

## 2024-01-29 DIAGNOSIS — E890 Postprocedural hypothyroidism: Secondary | ICD-10-CM | POA: Diagnosis present

## 2024-01-29 DIAGNOSIS — R911 Solitary pulmonary nodule: Secondary | ICD-10-CM | POA: Diagnosis present

## 2024-01-29 DIAGNOSIS — F05 Delirium due to known physiological condition: Secondary | ICD-10-CM | POA: Diagnosis not present

## 2024-01-29 DIAGNOSIS — R0902 Hypoxemia: Secondary | ICD-10-CM

## 2024-01-29 DIAGNOSIS — I482 Chronic atrial fibrillation, unspecified: Secondary | ICD-10-CM | POA: Diagnosis present

## 2024-01-29 DIAGNOSIS — I5031 Acute diastolic (congestive) heart failure: Secondary | ICD-10-CM | POA: Diagnosis present

## 2024-01-29 DIAGNOSIS — I130 Hypertensive heart and chronic kidney disease with heart failure and stage 1 through stage 4 chronic kidney disease, or unspecified chronic kidney disease: Secondary | ICD-10-CM | POA: Diagnosis present

## 2024-01-29 DIAGNOSIS — T45516A Underdosing of anticoagulants, initial encounter: Secondary | ICD-10-CM | POA: Diagnosis present

## 2024-01-29 DIAGNOSIS — F03C0 Unspecified dementia, severe, without behavioral disturbance, psychotic disturbance, mood disturbance, and anxiety: Secondary | ICD-10-CM | POA: Diagnosis present

## 2024-01-29 DIAGNOSIS — N401 Enlarged prostate with lower urinary tract symptoms: Secondary | ICD-10-CM | POA: Diagnosis present

## 2024-01-29 DIAGNOSIS — J189 Pneumonia, unspecified organism: Secondary | ICD-10-CM

## 2024-01-29 DIAGNOSIS — G20A1 Parkinson's disease without dyskinesia, without mention of fluctuations: Secondary | ICD-10-CM | POA: Diagnosis present

## 2024-01-29 DIAGNOSIS — J439 Emphysema, unspecified: Secondary | ICD-10-CM | POA: Diagnosis present

## 2024-01-29 DIAGNOSIS — H9193 Unspecified hearing loss, bilateral: Secondary | ICD-10-CM | POA: Diagnosis present

## 2024-01-29 DIAGNOSIS — J1008 Influenza due to other identified influenza virus with other specified pneumonia: Secondary | ICD-10-CM | POA: Diagnosis present

## 2024-01-29 DIAGNOSIS — J9620 Acute and chronic respiratory failure, unspecified whether with hypoxia or hypercapnia: Secondary | ICD-10-CM | POA: Diagnosis present

## 2024-01-29 DIAGNOSIS — J4489 Other specified chronic obstructive pulmonary disease: Secondary | ICD-10-CM | POA: Diagnosis present

## 2024-01-29 DIAGNOSIS — G309 Alzheimer's disease, unspecified: Secondary | ICD-10-CM | POA: Diagnosis not present

## 2024-01-29 DIAGNOSIS — E782 Mixed hyperlipidemia: Secondary | ICD-10-CM | POA: Diagnosis present

## 2024-01-29 DIAGNOSIS — Z515 Encounter for palliative care: Secondary | ICD-10-CM

## 2024-01-29 DIAGNOSIS — Z87891 Personal history of nicotine dependence: Secondary | ICD-10-CM

## 2024-01-29 DIAGNOSIS — Z66 Do not resuscitate: Secondary | ICD-10-CM | POA: Diagnosis present

## 2024-01-29 DIAGNOSIS — Z8546 Personal history of malignant neoplasm of prostate: Secondary | ICD-10-CM

## 2024-01-29 DIAGNOSIS — J101 Influenza due to other identified influenza virus with other respiratory manifestations: Secondary | ICD-10-CM | POA: Diagnosis present

## 2024-01-29 DIAGNOSIS — Z823 Family history of stroke: Secondary | ICD-10-CM

## 2024-01-29 DIAGNOSIS — R627 Adult failure to thrive: Secondary | ICD-10-CM | POA: Diagnosis present

## 2024-01-29 DIAGNOSIS — J9621 Acute and chronic respiratory failure with hypoxia: Secondary | ICD-10-CM | POA: Diagnosis present

## 2024-01-29 DIAGNOSIS — J441 Chronic obstructive pulmonary disease with (acute) exacerbation: Secondary | ICD-10-CM | POA: Diagnosis present

## 2024-01-29 DIAGNOSIS — Z7189 Other specified counseling: Secondary | ICD-10-CM

## 2024-01-29 DIAGNOSIS — Z8551 Personal history of malignant neoplasm of bladder: Secondary | ICD-10-CM

## 2024-01-29 DIAGNOSIS — N1832 Chronic kidney disease, stage 3b: Secondary | ICD-10-CM | POA: Diagnosis present

## 2024-01-29 DIAGNOSIS — C679 Malignant neoplasm of bladder, unspecified: Secondary | ICD-10-CM | POA: Diagnosis present

## 2024-01-29 DIAGNOSIS — J440 Chronic obstructive pulmonary disease with acute lower respiratory infection: Secondary | ICD-10-CM | POA: Diagnosis present

## 2024-01-29 DIAGNOSIS — J9622 Acute and chronic respiratory failure with hypercapnia: Secondary | ICD-10-CM | POA: Diagnosis present

## 2024-01-29 DIAGNOSIS — Z91128 Patient's intentional underdosing of medication regimen for other reason: Secondary | ICD-10-CM

## 2024-01-29 DIAGNOSIS — M1711 Unilateral primary osteoarthritis, right knee: Secondary | ICD-10-CM | POA: Diagnosis present

## 2024-01-29 DIAGNOSIS — J181 Lobar pneumonia, unspecified organism: Secondary | ICD-10-CM | POA: Diagnosis present

## 2024-01-29 DIAGNOSIS — Z8673 Personal history of transient ischemic attack (TIA), and cerebral infarction without residual deficits: Secondary | ICD-10-CM

## 2024-01-29 DIAGNOSIS — Z981 Arthrodesis status: Secondary | ICD-10-CM

## 2024-01-29 DIAGNOSIS — Z7951 Long term (current) use of inhaled steroids: Secondary | ICD-10-CM

## 2024-01-29 DIAGNOSIS — Z961 Presence of intraocular lens: Secondary | ICD-10-CM | POA: Diagnosis present

## 2024-01-29 DIAGNOSIS — Z7901 Long term (current) use of anticoagulants: Secondary | ICD-10-CM

## 2024-01-29 DIAGNOSIS — R350 Frequency of micturition: Secondary | ICD-10-CM | POA: Diagnosis present

## 2024-01-29 DIAGNOSIS — Z1152 Encounter for screening for COVID-19: Secondary | ICD-10-CM | POA: Diagnosis not present

## 2024-01-29 DIAGNOSIS — Z9981 Dependence on supplemental oxygen: Secondary | ICD-10-CM

## 2024-01-29 DIAGNOSIS — I4891 Unspecified atrial fibrillation: Secondary | ICD-10-CM | POA: Diagnosis present

## 2024-01-29 DIAGNOSIS — F02C11 Dementia in other diseases classified elsewhere, severe, with agitation: Secondary | ICD-10-CM

## 2024-01-29 DIAGNOSIS — E785 Hyperlipidemia, unspecified: Secondary | ICD-10-CM | POA: Diagnosis present

## 2024-01-29 DIAGNOSIS — Z888 Allergy status to other drugs, medicaments and biological substances status: Secondary | ICD-10-CM

## 2024-01-29 DIAGNOSIS — Z974 Presence of external hearing-aid: Secondary | ICD-10-CM

## 2024-01-29 DIAGNOSIS — I7121 Aneurysm of the ascending aorta, without rupture: Secondary | ICD-10-CM | POA: Diagnosis present

## 2024-01-29 DIAGNOSIS — Z96642 Presence of left artificial hip joint: Secondary | ICD-10-CM | POA: Diagnosis present

## 2024-01-29 DIAGNOSIS — F32A Depression, unspecified: Secondary | ICD-10-CM | POA: Diagnosis present

## 2024-01-29 DIAGNOSIS — I480 Paroxysmal atrial fibrillation: Secondary | ICD-10-CM | POA: Diagnosis present

## 2024-01-29 DIAGNOSIS — F419 Anxiety disorder, unspecified: Secondary | ICD-10-CM | POA: Diagnosis present

## 2024-01-29 DIAGNOSIS — Z79899 Other long term (current) drug therapy: Secondary | ICD-10-CM

## 2024-01-29 LAB — LACTIC ACID, PLASMA
Lactic Acid, Venous: 1 mmol/L (ref 0.5–1.9)
Lactic Acid, Venous: 1 mmol/L (ref 0.5–1.9)

## 2024-01-29 LAB — CBC
HCT: 41.1 % (ref 39.0–52.0)
Hemoglobin: 12.5 g/dL — ABNORMAL LOW (ref 13.0–17.0)
MCH: 29.9 pg (ref 26.0–34.0)
MCHC: 30.4 g/dL (ref 30.0–36.0)
MCV: 98.3 fL (ref 80.0–100.0)
Platelets: 167 10*3/uL (ref 150–400)
RBC: 4.18 MIL/uL — ABNORMAL LOW (ref 4.22–5.81)
RDW: 13.7 % (ref 11.5–15.5)
WBC: 3.1 10*3/uL — ABNORMAL LOW (ref 4.0–10.5)
nRBC: 0 % (ref 0.0–0.2)

## 2024-01-29 LAB — COMPREHENSIVE METABOLIC PANEL
ALT: 13 U/L (ref 0–44)
AST: 26 U/L (ref 15–41)
Albumin: 3.4 g/dL — ABNORMAL LOW (ref 3.5–5.0)
Alkaline Phosphatase: 48 U/L (ref 38–126)
Anion gap: 12 (ref 5–15)
BUN: 27 mg/dL — ABNORMAL HIGH (ref 8–23)
CO2: 28 mmol/L (ref 22–32)
Calcium: 8.5 mg/dL — ABNORMAL LOW (ref 8.9–10.3)
Chloride: 97 mmol/L — ABNORMAL LOW (ref 98–111)
Creatinine, Ser: 1.64 mg/dL — ABNORMAL HIGH (ref 0.61–1.24)
GFR, Estimated: 41 mL/min — ABNORMAL LOW (ref >60)
Glucose, Bld: 128 mg/dL — ABNORMAL HIGH (ref 70–99)
Potassium: 3.9 mmol/L (ref 3.5–5.1)
Sodium: 137 mmol/L (ref 135–145)
Total Bilirubin: 0.7 mg/dL (ref 0.0–1.2)
Total Protein: 6.7 g/dL (ref 6.5–8.1)

## 2024-01-29 LAB — PROCALCITONIN: Procalcitonin: <0.1 ng/mL

## 2024-01-29 LAB — BRAIN NATRIURETIC PEPTIDE: B Natriuretic Peptide: 116 pg/mL — ABNORMAL HIGH (ref 0.0–100.0)

## 2024-01-29 LAB — MAGNESIUM: Magnesium: 2 mg/dL (ref 1.7–2.4)

## 2024-01-29 LAB — PHOSPHORUS: Phosphorus: 3.1 mg/dL (ref 2.5–4.6)

## 2024-01-29 LAB — RESP PANEL BY RT-PCR (RSV, FLU A&B, COVID)  RVPGX2
Influenza A by PCR: POSITIVE — AB
Influenza B by PCR: NEGATIVE
Resp Syncytial Virus by PCR: NEGATIVE
SARS Coronavirus 2 by RT PCR: NEGATIVE

## 2024-01-29 MED ORDER — TRAZODONE HCL 50 MG PO TABS
50.0000 mg | ORAL_TABLET | Freq: Every evening | ORAL | Status: DC | PRN
  Administered 2024-01-30: 50 mg via ORAL
  Filled 2024-01-29: qty 1

## 2024-01-29 MED ORDER — ATORVASTATIN CALCIUM 20 MG PO TABS
20.0000 mg | ORAL_TABLET | Freq: Every day | ORAL | Status: DC
Start: 2024-01-30 — End: 2024-01-31
  Administered 2024-01-30 – 2024-01-31 (×2): 20 mg via ORAL
  Filled 2024-01-29: qty 2
  Filled 2024-01-29: qty 1

## 2024-01-29 MED ORDER — ENSURE ENLIVE PO LIQD
237.0000 mL | Freq: Two times a day (BID) | ORAL | Status: DC
  Administered 2024-01-30 – 2024-01-31 (×3): 237 mL via ORAL
  Filled 2024-01-29 (×5): qty 237

## 2024-01-29 MED ORDER — ACETAMINOPHEN 325 MG PO TABS: 650.0000 mg | ORAL_TABLET | Freq: Four times a day (QID) | ORAL | Status: DC | PRN

## 2024-01-29 MED ORDER — IOHEXOL 350 MG/ML SOLN: 75.0000 mL | Freq: Once | INTRAVENOUS | Status: DC | PRN

## 2024-01-29 MED ORDER — MELATONIN 3 MG PO TABS
6.0000 mg | ORAL_TABLET | Freq: Every day | ORAL | Status: DC
  Administered 2024-01-29 – 2024-01-30 (×2): 6 mg via ORAL
  Filled 2024-01-29 (×2): qty 2

## 2024-01-29 MED ORDER — SERTRALINE HCL 50 MG PO TABS
50.0000 mg | ORAL_TABLET | Freq: Every morning | ORAL | Status: DC
  Administered 2024-01-29 – 2024-01-31 (×3): 50 mg via ORAL
  Filled 2024-01-29 (×3): qty 1

## 2024-01-29 MED ORDER — GUAIFENESIN-DM 100-10 MG/5ML PO SYRP
10.0000 mL | ORAL_SOLUTION | Freq: Three times a day (TID) | ORAL | Status: DC
Start: 2024-01-29 — End: 2024-01-31
  Administered 2024-01-29 – 2024-01-30 (×5): 10 mL via ORAL
  Filled 2024-01-29 (×7): qty 10

## 2024-01-29 MED ORDER — HYDROCOD POLI-CHLORPHE POLI ER 10-8 MG/5ML PO SUER
5.0000 mL | Freq: Two times a day (BID) | ORAL | Status: DC
  Administered 2024-01-29 – 2024-01-31 (×5): 5 mL via ORAL
  Filled 2024-01-29 (×5): qty 5

## 2024-01-29 MED ORDER — FLEET ENEMA RE ENEM
1.0000 | ENEMA | Freq: Once | RECTAL | Status: DC | PRN
Start: 2024-01-29 — End: 2024-01-31

## 2024-01-29 MED ORDER — HYDROMORPHONE HCL 1 MG/ML IJ SOLN: 0.5000 mg | INTRAMUSCULAR | Status: DC | PRN

## 2024-01-29 MED ORDER — SODIUM CHLORIDE 0.9% FLUSH
3.0000 mL | Freq: Two times a day (BID) | INTRAVENOUS | Status: DC
  Administered 2024-01-29 – 2024-01-31 (×3): 3 mL via INTRAVENOUS

## 2024-01-29 MED ORDER — VERAPAMIL HCL ER 240 MG PO TBCR
240.0000 mg | EXTENDED_RELEASE_TABLET | Freq: Every day | ORAL | Status: DC
  Administered 2024-01-29 – 2024-01-31 (×3): 240 mg via ORAL
  Filled 2024-01-29 (×5): qty 1

## 2024-01-29 MED ORDER — FINASTERIDE 5 MG PO TABS
5.0000 mg | ORAL_TABLET | Freq: Every morning | ORAL | Status: DC
  Administered 2024-01-30 – 2024-01-31 (×2): 5 mg via ORAL
  Filled 2024-01-29 (×2): qty 1

## 2024-01-29 MED ORDER — HYDRALAZINE HCL 20 MG/ML IJ SOLN: 10.0000 mg | INTRAMUSCULAR | Status: DC | PRN

## 2024-01-29 MED ORDER — IOHEXOL 350 MG/ML SOLN
60.0000 mL | Freq: Once | INTRAVENOUS | Status: AC | PRN
  Administered 2024-01-29: 60 mL via INTRAVENOUS

## 2024-01-29 MED ORDER — HALOPERIDOL LACTATE 5 MG/ML IJ SOLN
2.0000 mg | Freq: Four times a day (QID) | INTRAMUSCULAR | Status: DC | PRN
  Administered 2024-01-29 – 2024-01-30 (×3): 2 mg via INTRAVENOUS
  Filled 2024-01-29 (×3): qty 1

## 2024-01-29 MED ORDER — SENNOSIDES-DOCUSATE SODIUM 8.6-50 MG PO TABS: 1.0000 | ORAL_TABLET | Freq: Every evening | ORAL | Status: DC | PRN

## 2024-01-29 MED ORDER — METHYLPREDNISOLONE SODIUM SUCC 125 MG IJ SOLR
60.0000 mg | Freq: Two times a day (BID) | INTRAMUSCULAR | Status: DC
  Administered 2024-01-29 – 2024-01-30 (×3): 60 mg via INTRAVENOUS
  Filled 2024-01-29 (×3): qty 2

## 2024-01-29 MED ORDER — LEVALBUTEROL HCL 1.25 MG/0.5ML IN NEBU: 1.2500 mg | INHALATION_SOLUTION | Freq: Four times a day (QID) | RESPIRATORY_TRACT | Status: DC | PRN

## 2024-01-29 MED ORDER — FUROSEMIDE 20 MG PO TABS: 20.0000 mg | ORAL_TABLET | Freq: Every day | ORAL | Status: DC | PRN

## 2024-01-29 MED ORDER — ADULT MULTIVITAMIN W/MINERALS CH
1.0000 | ORAL_TABLET | Freq: Every day | ORAL | Status: DC
  Administered 2024-01-30 – 2024-01-31 (×2): 1 via ORAL
  Filled 2024-01-29 (×2): qty 1

## 2024-01-29 MED ORDER — SODIUM CHLORIDE 0.9 % IV SOLN
2.0000 g | Freq: Once | INTRAVENOUS | Status: AC
  Administered 2024-01-29: 2 g via INTRAVENOUS
  Filled 2024-01-29: qty 12.5

## 2024-01-29 MED ORDER — ACETAMINOPHEN 650 MG RE SUPP: 650.0000 mg | Freq: Four times a day (QID) | RECTAL | Status: DC | PRN

## 2024-01-29 MED ORDER — IPRATROPIUM BROMIDE 0.02 % IN SOLN
0.5000 mg | Freq: Two times a day (BID) | RESPIRATORY_TRACT | Status: DC
  Administered 2024-01-30 – 2024-01-31 (×3): 0.5 mg via RESPIRATORY_TRACT
  Filled 2024-01-29 (×3): qty 2.5

## 2024-01-29 MED ORDER — SODIUM CHLORIDE 0.9% FLUSH
3.0000 mL | Freq: Two times a day (BID) | INTRAVENOUS | Status: DC
  Administered 2024-01-29 – 2024-01-31 (×4): 3 mL via INTRAVENOUS

## 2024-01-29 MED ORDER — TRAZODONE HCL 50 MG PO TABS: 25.0000 mg | ORAL_TABLET | Freq: Every evening | ORAL | Status: DC | PRN

## 2024-01-29 MED ORDER — ONDANSETRON HCL 4 MG PO TABS: 4.0000 mg | ORAL_TABLET | Freq: Four times a day (QID) | ORAL | Status: DC | PRN

## 2024-01-29 MED ORDER — METHYLPREDNISOLONE SODIUM SUCC 125 MG IJ SOLR
125.0000 mg | Freq: Once | INTRAMUSCULAR | Status: AC
  Administered 2024-01-29: 125 mg via INTRAVENOUS
  Filled 2024-01-29: qty 2

## 2024-01-29 MED ORDER — BUDESONIDE 0.25 MG/2ML IN SUSP
0.2500 mg | Freq: Two times a day (BID) | RESPIRATORY_TRACT | Status: DC
  Administered 2024-01-29 – 2024-01-31 (×3): 0.25 mg via RESPIRATORY_TRACT
  Filled 2024-01-29 (×4): qty 2

## 2024-01-29 MED ORDER — IPRATROPIUM BROMIDE 0.02 % IN SOLN
0.5000 mg | Freq: Four times a day (QID) | RESPIRATORY_TRACT | Status: DC
  Administered 2024-01-29: 0.5 mg via RESPIRATORY_TRACT
  Filled 2024-01-29 (×2): qty 2.5

## 2024-01-29 MED ORDER — HEPARIN SODIUM (PORCINE) 5000 UNIT/ML IJ SOLN
5000.0000 [IU] | Freq: Three times a day (TID) | INTRAMUSCULAR | Status: DC
Start: 2024-01-29 — End: 2024-01-29

## 2024-01-29 MED ORDER — LEVALBUTEROL HCL 1.25 MG/0.5ML IN NEBU
1.2500 mg | INHALATION_SOLUTION | Freq: Two times a day (BID) | RESPIRATORY_TRACT | Status: DC
  Administered 2024-01-30 – 2024-01-31 (×3): 1.25 mg via RESPIRATORY_TRACT
  Filled 2024-01-29 (×3): qty 0.5

## 2024-01-29 MED ORDER — VANCOMYCIN HCL IN DEXTROSE 1-5 GM/200ML-% IV SOLN
1000.0000 mg | Freq: Once | INTRAVENOUS | Status: DC
Start: 2024-01-29 — End: 2024-01-29

## 2024-01-29 MED ORDER — RISPERIDONE 0.5 MG PO TABS: 0.2500 mg | ORAL_TABLET | ORAL | Status: DC | PRN

## 2024-01-29 MED ORDER — ONDANSETRON HCL 4 MG/2ML IJ SOLN: 4.0000 mg | Freq: Four times a day (QID) | INTRAMUSCULAR | Status: DC | PRN

## 2024-01-29 MED ORDER — LEVALBUTEROL HCL 1.25 MG/0.5ML IN NEBU
1.2500 mg | INHALATION_SOLUTION | Freq: Four times a day (QID) | RESPIRATORY_TRACT | Status: DC
  Administered 2024-01-29: 1.25 mg via RESPIRATORY_TRACT
  Filled 2024-01-29 (×2): qty 0.5

## 2024-01-29 MED ORDER — APIXABAN 5 MG PO TABS: 5.0000 mg | ORAL_TABLET | Freq: Two times a day (BID) | ORAL | Status: DC

## 2024-01-29 MED ORDER — AMIODARONE HCL 200 MG PO TABS
200.0000 mg | ORAL_TABLET | Freq: Two times a day (BID) | ORAL | Status: DC
  Administered 2024-01-29 – 2024-01-31 (×5): 200 mg via ORAL
  Filled 2024-01-29 (×4): qty 1

## 2024-01-29 MED ORDER — OSELTAMIVIR PHOSPHATE 30 MG PO CAPS
30.0000 mg | ORAL_CAPSULE | Freq: Two times a day (BID) | ORAL | Status: DC
  Administered 2024-01-29 – 2024-01-31 (×5): 30 mg via ORAL
  Filled 2024-01-29 (×5): qty 1

## 2024-01-29 MED ORDER — APIXABAN 2.5 MG PO TABS
2.5000 mg | ORAL_TABLET | Freq: Two times a day (BID) | ORAL | Status: DC
  Administered 2024-01-29 – 2024-01-31 (×4): 2.5 mg via ORAL
  Filled 2024-01-29 (×4): qty 1

## 2024-01-29 MED ORDER — VANCOMYCIN HCL 1500 MG/300ML IV SOLN
1500.0000 mg | Freq: Once | INTRAVENOUS | Status: AC
  Administered 2024-01-29: 1500 mg via INTRAVENOUS
  Filled 2024-01-29: qty 300

## 2024-01-29 MED ORDER — OXYCODONE HCL 5 MG PO TABS
5.0000 mg | ORAL_TABLET | ORAL | Status: DC | PRN
Start: 2024-01-29 — End: 2024-01-31

## 2024-01-29 MED ORDER — BISACODYL 5 MG PO TBEC: 5.0000 mg | DELAYED_RELEASE_TABLET | Freq: Every day | ORAL | Status: DC | PRN

## 2024-01-29 MED ORDER — FLUTICASONE FUROATE-VILANTEROL 200-25 MCG/ACT IN AEPB
1.0000 | INHALATION_SPRAY | Freq: Every day | RESPIRATORY_TRACT | Status: DC
  Filled 2024-01-29: qty 28

## 2024-01-29 NOTE — ED Notes (Signed)
Pt back up on side of bed with legs hanging out of bed through bed rails, this nurse and Pt daughter helped Pt back to bed, this nurse requested something for pt.

## 2024-01-29 NOTE — ED Notes (Signed)
Pt currently resting is not combative at this time, no response from provider about medication for calming patient down. Issue is not a problem at this time.

## 2024-01-29 NOTE — ED Triage Notes (Signed)
 Pt BIB ems for hypoxia and weakness. Pt initial oxygen saturation 70% on 5 L at home. Pt's HH nurse gave albuterol  treatment increasing oxygen saturation to 85% on 5L. EMS administered duo-neb oxygen increasing to 95% on 6L. Pt initial bilateral wheezing after treatment has subsided per EMS. Bilateral pitting edema noted. Pt has hx of lung diseases.

## 2024-01-29 NOTE — ED Notes (Signed)
Patient combative, attempting to get out of bed spitting out medications. Request for medication IV to help to calm patient.

## 2024-01-29 NOTE — Consult Note (Signed)
 Consultation Note Date: 01/29/2024   Patient Name: Kyle Owen  DOB: 10/15/1941  MRN: [id number]  Age / Sex: 83 y.o., male  PCP: Sheryle Carwin, MD Referring Physician: Willette Adriana DELENA, MD  Reason for Consultation: Establishing goals of care  HPI/Patient Profile: 83 y.o. male  with past medical history of advanced dementia, chronic A-fib on Eliquis , prostate cancer/BPH, COPD oxygen dependent, TIAs, HTN/HLD, OA, DJD, chronic thoracic aneurysm, depression, admitted on 01/29/2024 with acute on chronic respiratory failure with hypoxia, influenza A positive/SIRS ruling out sepsis.   Clinical Assessment and Goals of Care: I have reviewed medical records including EPIC notes, labs and imaging, received report from RN, assessed the patient.  Mr. Concannon is lying quietly on the stretcher in the ED.  He is resting comfortably, but wakes easily when I shake his arm.  He has known dementia and he seems to be very hard of hearing.  He is able to tell me his name, but not where we are.  I am not sure that he can make his basic needs known.  There is no family at bedside at this time.  Life Review:  We discussed a brief life review of the patient.  Per January 6 palliative consult note, The patient was previously married, although his first wife passed. He is now remarried to his second wife. He does currently live at home with his wife. He lives in [place], [place] . He loves puzzles and playing cards, although this is becoming more difficult with his progressive dementia, especially as of late. 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  Call to daughter/HCPOA, [name] Goldman to discuss diagnosis prognosis, GOC, EOL wishes, disposition and options.  I introduced Palliative Medicine as specialized medical care for people living with serious illness. It focuses on providing relief from the symptoms and stress of a serious illness.  The goal is to improve quality of life for both the patient and the family.  We then focused on their current illness.  We talk about Mr. Wilnette acute health concerns and the treatment plan.  We talked about time for outcomes. PMT to follow.  The natural disease trajectory and expectations at EOL were discussed.  Advanced directives, concepts specific to code status, artifical feeding and hydration, and rehospitalization were considered and discussed.  Daughter/HCPOA, [name], readily endorses DNR.  No chest compressions no intubation, orders adjusted.  Discussed the importance of continued conversation with family and the medical providers regarding overall plan of care and treatment options, ensuring decisions are within the context of the patient's values and GOCs.  Questions and concerns were addressed.  Hard Choices booklet left for review. The family was encouraged to call with questions or concerns.  PMT will continue to support holistically.  Conference with attending, bedside nursing staff, transition of care team related to patient condition, needs, goals of care, disposition.   HCPOA HCPOA -per January visit with palliative team.   We discussed noted HCPOA documentation in ACP tab/Vynca.  [name] confirms that she is the first  HCPOA, Sharlet a second HCPOA.  Krystal (her brother) was third HCPOA although he passed away last year.     SUMMARY OF RECOMMENDATIONS   Continue to treat the treatable but no CPR or intubation Time for outcomes PMT to follow   Code Status/Advance Care Planning: DNR  Symptom Management:  Per hospitalist, no additional needs at this time.  Palliative Prophylaxis:  Frequent Pain Assessment, Oral Care, and Turn Reposition  Additional Recommendations (Limitations, Scope, Preferences): Continue to treat but no CPR or intubation  Psycho-social/Spiritual:  Desire for further Chaplaincy support:no Additional Recommendations: Caregiving   Support/Resources  Prognosis:  Unable to determine, based on outcomes.  Guarded at this point  Discharge Planning: To be determined, based on outcomes/needs.      Primary Diagnoses: Present on Admission:  Acute on chronic respiratory failure with hypoxia (HCC)  Advanced dementia (HCC)  Anxiety  Atrial fibrillation with rapid ventricular response (HCC)  Bladder cancer (HCC)  Chronic kidney disease, stage 3b (HCC)  COPD with chronic bronchitis and emphysema (HCC)  Hyperlipidemia  Lobar pneumonia (HCC)  Paroxysmal atrial fibrillation (HCC)  Pulmonary nodule, left  Thoracic ascending aortic aneurysm (HCC)   I have reviewed the medical record, interviewed the patient and family, and examined the patient. The following aspects are pertinent.  Past Medical History:  Diagnosis Date   A-fib (HCC)    Anticoagulant long-term use    eliquis --- managed by cardiology   Benign prostatic hypertrophy with urinary frequency    Bladder cancer (HCC)    Chronic respiratory failure with hypoxia (HCC)    Complication of anesthesia    gets combative in recovery   COPD with chronic bronchitis and emphysema (HCC)    pulmonologist--- dr jude;  nocturnal oxygen,  no daytime oxygen use   Dementia (HCC)    Dependence on nocturnal oxygen therapy    Depression    Dyspnea    when walking   History of colon polyps    History of kidney stones    History of loop recorder    loop recorder in place battery is dead has not had changed due to covid   History of TIA (transient ischemic attack)    (per pt no residual ) per neurologist note (dr margaret 11-08-2015) dx cryptogenic TIA versus arrhythia versus dysautonomia (right ophthalmic artery TIA and x3 posterior circulation TIAs   Hyperlipidemia    Hypertension    Pt denies   Malignant neoplasm of overlapping sites of bladder [facility])    urologist--- dr matilda   Multinodular thyroid     per pathology report 11-12-2014 bilateral thyroid   benign  multinoduler follicular adenoma and hyperplastic    Nephrolithiasis    OA (osteoarthritis)    DJD right knee   Ocular migraine    controlled w/ verapamil    On home oxygen therapy    PAF (paroxysmal atrial fibrillation) (HCC)    followed by AFIB clinic-- cardiologist-- dr jeffrie   Pneumonia    2023   Pulmonary nodule, left    left lower lobe x2 per CT 01-20-2016   Renal cyst, left    Stroke [facility])    Thoracic aortic aneurysm without rupture [facility])    followed by dr fleeta tright ;   ascending -- ct chest 01-24-2023   4.8cm   Wears hearing aid in both ears    wear at times   Social History   Socioeconomic History   Marital status: Married    Spouse name: Not on file   Number of  children: 3   Years of education: 14   Highest education level: Not on file  Occupational History   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  Tobacco Use   Smoking status: Former    Current packs/day: 0.00    Average packs/day: 1 pack/day for 45.0 years (45.0 ttl pk-yrs)    Types: Cigarettes    Start date: 09/26/1965    Quit date: 09/26/2010    Years since quitting: 13.3   Smokeless tobacco: Never  Vaping Use   Vaping status: Never Used  Substance and Sexual Activity   Alcohol use: No   Drug use: Never   Sexual activity: Not Currently  Other Topics Concern   Not on file  Social History Narrative   Widowed, lives with dog, Jake in [place], wife passed from Parkinson's disease 3 1/2 yrs ago   1 cup coffee daily   Social Drivers of Corporate Investment Banker Strain: Not on file  Food Insecurity: No Food Insecurity (12/29/2023)   Hunger Vital Sign    Worried About Running Out of Food in the Last Year: Never true    Ran Out of Food in the Last Year: Never true  Transportation Needs: No Transportation Needs (01/15/2024)   Received from [facility]   PRAPARE - Transportation    Lack of Transportation (Medical): No    Lack of Transportation (Non-Medical): No  Physical Activity: Not on  file  Stress: Not on file  Social Connections: Socially Integrated (12/29/2023)   Social Connection and Isolation Panel [NHANES]    Frequency of Communication with Friends and Family: Once a week    Frequency of Social Gatherings with Friends and Family: Three times a week    Attends Religious Services: 1 to 4 times per year    Active Member of Clubs or Organizations: Yes    Attends Banker Meetings: Never    Marital Status: Married   Family History  Problem Relation Age of Onset   Stroke Sister    Breast cancer Sister    Stroke Brother    Scheduled Meds:  amiodarone   200 mg Oral BID   apixaban   2.5 mg Oral BID   [START ON 01/30/2024] atorvastatin   20 mg Oral Daily   budesonide  (PULMICORT ) nebulizer solution  0.25 mg Nebulization BID   chlorpheniramine-HYDROcodone   5 mL Oral Q12H   [START ON 01/30/2024] feeding supplement  237 mL Oral BID BM   [START ON 01/30/2024] finasteride   5 mg Oral q AM   [START ON 01/30/2024] fluticasone  furoate-vilanterol  1 puff Inhalation Daily   guaiFENesin -dextromethorphan   10 mL Oral Q8H   ipratropium  0.5 mg Nebulization Q6H   levalbuterol   1.25 mg Nebulization Q6H   melatonin  6 mg Oral QHS   methylPREDNISolone  sodium succinate  60 mg Intravenous Q12H   [START ON 01/30/2024] multivitamin with minerals  1 tablet Oral Daily   oseltamivir   30 mg Oral BID   sertraline   50 mg Oral q morning   sodium chloride  flush  3 mL Intravenous Q12H   sodium chloride  flush  3 mL Intravenous Q12H   verapamil   240 mg Oral Daily   Continuous Infusions: PRN Meds:.acetaminophen  **OR** acetaminophen , bisacodyl , furosemide , hydrALAZINE , HYDROmorphone  (DILAUDID ) injection, ondansetron  **OR** ondansetron  (ZOFRAN ) IV, oxyCODONE , risperiDONE , senna-docusate, sodium phosphate , traZODone  Medications Prior to Admission:  Prior to Admission medications   Medication Sig Start Date End Date Taking? Authorizing Provider  acetaminophen  (TYLENOL ) 500 MG tablet Take 500 mg by  mouth in the morning and at bedtime.   Yes [provider]  amiodarone  (PACERONE ) 200 MG tablet Take 200 mg by mouth 2 (two) times daily.   Yes [provider]  atorvastatin  (LIPITOR) 20 MG tablet TAKE 1 TABLET BY MOUTH DAILY. Patient taking differently: Take 20 mg by mouth daily. 05/09/18  Yes Jeffrie Oneil BROCKS, MD  budesonide -formoterol  (SYMBICORT ) 160-4.5 MCG/ACT inhaler INHALE 2 PUFFS INTO THE LUNGS TWICE DAILY. 11/09/23  Yes Darlean Ozell NOVAK, MD  cetirizine (ZYRTEC) 10 MG tablet Take 10 mg by mouth daily.   Yes [provider]  Cholecalciferol  (VITAMIN D3) 25 MCG (1000 UT) CAPS Take by mouth. 01/21/24 02/20/24 Yes [provider]  doxycycline  (VIBRA -TABS) 100 MG tablet Take 100 mg by mouth 2 (two) times daily. 7 day suppy 01/28/24  Yes [provider]  ELIQUIS  5 MG TABS tablet Take 5 mg by mouth 2 (two) times daily. 01/24/24  Yes [provider]  finasteride  (PROSCAR ) 5 MG tablet Take 5 mg by mouth in the morning.   Yes [provider]  furosemide  (LASIX ) 20 MG tablet Take 20 mg by mouth daily as needed. 01/24/24  Yes [provider]  ipratropium-albuterol  (DUONEB) 0.5-2.5 (3) MG/3ML SOLN Inhale into the lungs. 01/20/24 02/19/24 Yes [provider]  Magnesium  250 MG TABS Take 250 mg by mouth in the morning and at bedtime.   Yes [provider]  Multiple Vitamins-Minerals (CENTRUM ADULT PO) Take 1 tablet by mouth daily.   Yes [provider]  predniSONE  (DELTASONE ) 20 MG tablet Take 20 mg by mouth 2 (two) times daily. 01/28/24  Yes [provider]  risperiDONE  (RISPERDAL ) 0.25 MG tablet Take 0.25 mg by mouth every 4 (four) hours as needed. 01/24/24  Yes [provider]  sertraline  (ZOLOFT ) 50 MG tablet Take 50 mg by mouth every morning.   Yes [provider]  traZODone  (DESYREL ) 50 MG tablet Take 50 mg by mouth daily.   Yes [provider]  verapamil  (CALAN -SR) 240 MG CR tablet  Take 240 mg by mouth daily.   Yes [provider]  Cholecalciferol  (VITAMIN D ) 50 MCG (2000 UT) tablet Take 2,000 Units by mouth in the morning.    [provider]  feeding supplement (ENSURE ENLIVE / ENSURE PLUS) LIQD Take 237 mLs by mouth 2 (two) times daily between meals. 01/06/24   Evonnie Lenis, MD  ipratropium-albuterol  (DUONEB) 0.5-2.5 (3) MG/3ML SOLN Take 3 mLs by nebulization every 6 (six) hours as needed (as needed for shortness of breath or wheezing). 10/21/23 12/29/23  Arrien, Mauricio Daniel, MD  melatonin 3 MG TABS tablet Take 2 tablets (6 mg total) by mouth at bedtime. 01/06/24   Evonnie Lenis, MD   Allergies  Allergen Reactions   Flomax [Tamsulosin Hcl] Nausea And Vomiting and Other (See Comments)    Pt thought he was dying  DIZZY   Fentanyl  Other (See Comments)    Makes me combative  Pt states he's had it since then and was fine 10-19-23   Review of Systems  Unable to perform ROS: Dementia    Physical Exam Vitals and nursing note reviewed.  Constitutional:      General: He is not in acute distress.    Appearance: He is ill-appearing.  Neurological:     Mental Status: He is alert.     Vital Signs: BP 115/66   Pulse 68   Resp 17   Ht 6' (1.829 m)   Wt 82.1 kg  SpO2 95%   BMI 24.55 kg/m  Pain Scale: 0-10   Pain Score: 0-No pain   SpO2: SpO2: 95 % O2 Device:SpO2: 95 % O2 Flow Rate: .   IO: Intake/output summary: No intake or output data in the 24 hours ending 01/29/24 1603  LBM:   Baseline Weight: Weight: 82.1 kg Most recent weight: Weight: 82.1 kg     Palliative Assessment/Data:     Time In: 1520  Time Out: 1615 Time Total: 55 minutes  Greater than 50%  of this time was spent counseling and coordinating care related to the above assessment and plan.  Signed by: Lorenza DELENA Birkenhead, NP   Please contact Palliative Medicine Team phone at [phone] for questions and concerns.  For individual provider: See Tracey

## 2024-01-29 NOTE — Hospital Course (Addendum)
 Kyle Owen is a 83 year old male with extensive history of advanced dementia, chronic A-fib on Eliquis , BPH, prostate cancer, COPD oxygen dependent 4 L, depression, TIAs, HLD, HTN, OA, DJD, old pulmonary nodule, chronic thoracic aneurysm.... Presented today with progressive shortness of breath, hypoxia. Per ED report on arrival patient was satting 70s-80s-was placed on 5 L of oxygen, currently satting 95% Per PCP started doxycycline  and prednisone  yesterday.  Off Lasix  due to diarrhea.  Per daughter missed multiple dose of Eliquis  this week as patient is not feeling well. Confused agitated-poor historian   Patient Denies having: Chest Pain, Abd pain, N/V/D, headache, dizziness, lightheadedness,  Dysuria, Joint pain, rash, open wounds   ED evaluation: Blood pressure 115/66, pulse 68, resp. rate 17, height 6' (1.829 m), weight 82.1 kg, SpO2 95%. Labs: WBC 3.1, BUN 27, creatinine 1.64, calcium  8.7, glucose 128, BNP 116, lactic acid 1.0, 1.0  Influenza A positive Influenza B, SARS-CoV-2, RSV --negative  Chest x-ray: Worsening airspace opacity in the right mid lung, suspicious for active pneumonia CT angio negative for PE, worsening infiltrate possible multifocal pneumonia 3 nodules inflammatory versus infectious, thoracic aneurysm 4.9   Requested patient to be admitted acute on chronic respiratory failure.   Review of Systems: As per HPI, otherwise 10 point review of systems were negative   Assessment / Plan:   Principal Problem:   Acute on chronic respiratory failure with hypoxia (HCC) Active Problems:   Paroxysmal atrial fibrillation (HCC)   COPD with chronic bronchitis and emphysema (HCC)   Pulmonary nodule, left   Thoracic ascending aortic aneurysm (HCC)   Advanced dementia (HCC)   Lobar pneumonia (HCC)   Chronic kidney disease, stage 3b (HCC)   Anxiety   History of stroke   Bladder cancer (HCC)   Hyperlipidemia   Atrial fibrillation with rapid ventricular response  (HCC)      Assessment and Plan:   Influenza A positive Acute on chronic respiratory failure with hypoxia and hypercarbia-with underlying COPD and acute infection influenza A -Baseline O2 requirement 4 L>> currently on 5 L, satting 95% -Presented with hypoxia -Secondary to influenza A, pneumonia and COPD exacerbation --Continue O2 supplements keeping O2 greater than 90% -Continue bronchodilators -Just finished steroids on last admission unfortunately needing more steroids -planning for taper    SIRS Ruling out sepsis -Currently meeting SIRS criteria, or leukocytosis acute on chronic respiratory failure, influenza A induced pneumonia -Secondary to pneumonia -Lactic acid 1.1, WBC 3.1, creatinine 1.64, -01/29/2024 CTA: Reviewed negative for PE, consolidation in the posterior right upper lobe, lingula, bilateral lower lobe likely worsening multifocal pneumonia scattered groundglass and 3 but nodules in the lobe likely inflammatory versus infection, thoracic aortic aneurysm 4.9 cm (On last CT 4.7 cm)  -ED initiated broad-spectrum antibiotics-vancomycin  and cefepime  As patient remained afebrile, no leukocytosis, lactic acid 0.1, obtain procalcitonin level Reassessing need for continuing antibiotics     Lobar pneumonia -Possible viral pneumonia -Reevaluating the need to continue antibiotics -PCT  -On last admission MRSA screen negative -finished 7 days antibiotics on 01/05/24 -COVID-19 PCR negative   Paroxysmal atrial fibrillation -Heart rate 68, sinus -Continue home medication of amiodarone  - long acting diltiazem  -Continue apixaban  -01/03/24 echo--EF 65-70%, no WMA, normal RVF, mild TR -remains in sinus on the day of d/c   Acute heart failure with preserved EF -Avoiding volume overload, monitoring I's and O's, daily weight -01/03/24 echo--EF 65-70%, no WMA, normal RVF, mild TR -Patient clinically euvolemic  -Patient is on 20 mg of Lasix  as needed-   Acute  on CKD stage  IIIb -Baseline creatinine 1.5-1.8 Lab Results  Component Value Date   CREATININE 1.64 (H) 01/29/2024   CREATININE 1.81 (H) 01/07/2024   CREATININE 1.61 (H) 01/06/2024   -Avoiding nephrotoxins, hypotension    Mixed hyperlipidemia -Continue statin   Thoracic ascending aortic aneurysm (HCC) -Chronic history of thoracic ascending aortic aneurysm -Based on CTA 4.7>>7.9  cm  -Follow-up as an outpatient and monitor closely   Major neurocognitive disorder -Patient experienced some episodes hospital delirium -However, patient was redirectable. -He was noted to be playing cards with his wife on multiple occasions during the hospitalization

## 2024-01-29 NOTE — ED Provider Notes (Signed)
 [place] EMERGENCY DEPARTMENT AT [facility] Provider Note   CSN: [id number] Arrival date & time: 01/29/24  1013     History  Chief Complaint  Patient presents with   Weakness    Kyle Owen is a 83 y.o. male.  83 year old male with history of dementia, COPD on 3 L home oxygen, atrial fibrillation on Eliquis , and thoracic aortic aneurysm who presents to the emergency department with shortness of breath and hypoxia.  History obtained per the patient's daughter.  States that he lives with her at home and she manages medications.  2 days ago started experiencing a cough as well as some congestion.  Has noted that he is short of breath and his oxygen saturations have been in the low 80s and 70s.  They have increased his nasal cannula because of this to 5 L.  Primary doctor prescribed them doxycycline  and prednisone .  Took the doxycycline  yesterday but on the prednisone .  Have been off of his Lasix  because he had diarrhea recently which I thought was due to a medication reaction.  Daughter reports that he has missed multiple doses of his Eliquis  this week.  EMS noted him to be wheezing and gave him a DuoNeb which his daughter reports has significantly improved his symptoms.       Home Medications Prior to Admission medications   Medication Sig Start Date End Date Taking? Authorizing Provider  acetaminophen  (TYLENOL ) 500 MG tablet Take 500 mg by mouth in the morning and at bedtime.   Yes [provider]  amiodarone  (PACERONE ) 200 MG tablet Take 200 mg by mouth 2 (two) times daily.   Yes [provider]  atorvastatin  (LIPITOR) 20 MG tablet TAKE 1 TABLET BY MOUTH DAILY. Patient taking differently: Take 20 mg by mouth daily. 05/09/18  Yes Jeffrie Oneil BROCKS, MD  budesonide -formoterol  (SYMBICORT ) 160-4.5 MCG/ACT inhaler INHALE 2 PUFFS INTO THE LUNGS TWICE DAILY. 11/09/23  Yes Darlean Ozell NOVAK, MD  cetirizine (ZYRTEC) 10 MG tablet Take 10 mg by mouth daily.   Yes  [provider]  Cholecalciferol  (VITAMIN D3) 25 MCG (1000 UT) CAPS Take by mouth. 01/21/24 02/20/24 Yes [provider]  doxycycline  (VIBRA -TABS) 100 MG tablet Take 100 mg by mouth 2 (two) times daily. 7 day suppy 01/28/24  Yes [provider]  ELIQUIS  5 MG TABS tablet Take 5 mg by mouth 2 (two) times daily. 01/24/24  Yes [provider]  finasteride  (PROSCAR ) 5 MG tablet Take 5 mg by mouth in the morning.   Yes [provider]  furosemide  (LASIX ) 20 MG tablet Take 20 mg by mouth daily as needed. 01/24/24  Yes [provider]  ipratropium-albuterol  (DUONEB) 0.5-2.5 (3) MG/3ML SOLN Inhale into the lungs. 01/20/24 02/19/24 Yes [provider]  Magnesium  250 MG TABS Take 250 mg by mouth in the morning and at bedtime.   Yes [provider]  Multiple Vitamins-Minerals (CENTRUM ADULT PO) Take 1 tablet by mouth daily.   Yes [provider]  predniSONE  (DELTASONE ) 20 MG tablet Take 20 mg by mouth 2 (two) times daily. 01/28/24  Yes [provider]  risperiDONE  (RISPERDAL ) 0.25 MG tablet Take 0.25 mg by mouth every 4 (four) hours as needed. 01/24/24  Yes [provider]  sertraline  (ZOLOFT ) 50 MG tablet Take 50 mg by mouth every morning.   Yes [provider]  verapamil  (CALAN -SR) 240 MG CR tablet Take 240 mg by mouth daily.   Yes [provider]  Cholecalciferol  (VITAMIN  D) 50 MCG (2000 UT) tablet Take 2,000 Units by mouth in the morning.    [provider]  feeding supplement (ENSURE ENLIVE / ENSURE PLUS) LIQD Take 237 mLs by mouth 2 (two) times daily between meals. 01/06/24   Evonnie Lenis, MD  ipratropium-albuterol  (DUONEB) 0.5-2.5 (3) MG/3ML SOLN Take 3 mLs by nebulization every 6 (six) hours as needed (as needed for shortness of breath or wheezing). 10/21/23 12/29/23  Arrien, Mauricio Daniel, MD  melatonin 3 MG TABS tablet Take 2 tablets (6 mg total) by mouth at bedtime. 01/06/24   Evonnie Lenis, MD       Allergies    Flomax [tamsulosin hcl] and Fentanyl     Review of Systems   Review of Systems  Physical Exam Updated Vital Signs BP (!) 148/85   Pulse 68   Resp (!) 22   Ht 6' (1.829 m)   Wt 82.1 kg   SpO2 95%   BMI 24.55 kg/m  Physical Exam Vitals and nursing note reviewed.  Constitutional:      General: He is not in acute distress.    Appearance: He is well-developed.  HENT:     Head: Normocephalic and atraumatic.     Right Ear: External ear normal.     Left Ear: External ear normal.     Nose: Nose normal.  Eyes:     Extraocular Movements: Extraocular movements intact.     Conjunctiva/sclera: Conjunctivae normal.     Pupils: Pupils are equal, round, and reactive to light.  Cardiovascular:     Rate and Rhythm: Normal rate and regular rhythm.     Heart sounds: Normal heart sounds.  Pulmonary:     Effort: Pulmonary effort is normal. No respiratory distress.     Breath sounds: Normal breath sounds.  Musculoskeletal:     Cervical back: Normal range of motion and neck supple.     Right lower leg: Edema present.     Left lower leg: No edema.  Skin:    General: Skin is warm and dry.  Neurological:     Mental Status: He is alert. Mental status is at baseline.  Psychiatric:        Mood and Affect: Mood normal.        Behavior: Behavior normal.     ED Results / Procedures / Treatments   Labs (all labs ordered are listed, but only abnormal results are displayed) Labs Reviewed  RESP PANEL BY RT-PCR (RSV, FLU A&B, COVID)  RVPGX2 - Abnormal; Notable for the following components:      Result Value   Influenza A by PCR POSITIVE (*)    All other components within normal limits  COMPREHENSIVE METABOLIC PANEL - Abnormal; Notable for the following components:   Chloride 97 (*)    Glucose, Bld 128 (*)    BUN 27 (*)    Creatinine, Ser 1.64 (*)    Calcium  8.5 (*)    Albumin 3.4 (*)    GFR, Estimated 41 (*)    All other components within normal limits  CBC - Abnormal;  Notable for the following components:   WBC 3.1 (*)    RBC 4.18 (*)    Hemoglobin 12.5 (*)    All other components within normal limits  BRAIN NATRIURETIC PEPTIDE - Abnormal; Notable for the following components:   B Natriuretic Peptide 116.0 (*)    All other components within normal limits  CULTURE, BLOOD (ROUTINE X 2)  CULTURE, BLOOD (ROUTINE X 2)  EXPECTORATED SPUTUM  ASSESSMENT W GRAM STAIN, RFLX TO RESP C  LACTIC ACID, PLASMA  LACTIC ACID, PLASMA  PROCALCITONIN  MAGNESIUM   PHOSPHORUS  BASIC METABOLIC PANEL  CBC    EKG EKG Interpretation Date/Time:  Tuesday January 29 2024 10:58:13 EST Ventricular Rate:  76 PR Interval:  201 QRS Duration:  101 QT Interval:  386 QTC Calculation: 434 R Axis:   72  Text Interpretation: Sinus rhythm Consider left atrial enlargement Low voltage with right axis deviation Confirmed by Yolande Charleston [phone]) on 01/29/2024 3:11:03 PM  Radiology CT Angio Chest PE W and/or Wo Contrast Result Date: 01/29/2024 CLINICAL DATA:  Hypoxia and weakness EXAM: CT ANGIOGRAPHY CHEST WITH CONTRAST TECHNIQUE: Multidetector CT imaging of the chest was performed using the standard protocol during bolus administration of intravenous contrast. Multiplanar CT image reconstructions and MIPs were obtained to evaluate the vascular anatomy. RADIATION DOSE REDUCTION: This exam was performed according to the departmental dose-optimization program which includes automated exposure control, adjustment of the mA and/or kV according to patient size and/or use of iterative reconstruction technique. CONTRAST:  60mL OMNIPAQUE  IOHEXOL  350 MG/ML SOLN COMPARISON:  Same day chest radiograph, CTA chest dated 12/29/2023, CT chest dated 01/24/2023 FINDINGS: Cardiovascular: The study is high quality for the evaluation of pulmonary embolism. There are no filling defects in the central, lobar, segmental or subsegmental pulmonary artery branches to suggest acute pulmonary embolism. Ascending  thoracic aorta measures 4.9 x 4.8 cm, unchanged from 01/24/2023. normal heart size. No significant pericardial fluid/thickening. Coronary artery calcifications and aortic atherosclerosis. Mediastinum/Nodes: Imaged thyroid  gland without nodules meeting criteria for imaging follow-up by size. Normal esophagus. No pathologically enlarged axillary, supraclavicular, mediastinal, or hilar lymph nodes. Lungs/Pleura: The central airways are patent. Severe peribronchial wall thickening of the bilateral lower lobes. Interval increased irregular consolidation in the posterior right upper lobe, lingula, and bilateral lower lobes. Decreased but persistent scattered ground-glass and tree-in-bud nodules within all lobes. No pneumothorax. No pleural effusion. Upper abdomen: Calcified splenic granulomata. Subcentimeter hepatic segment 7 hypodensities, too small to characterize. Musculoskeletal: No acute or abnormal lytic or blastic osseous lesions. Multilevel degenerative changes of the thoracic spine. Review of the MIP images confirms the above findings. IMPRESSION: 1. No evidence of pulmonary embolism. 2. Interval increased irregular consolidation in the posterior right upper lobe, lingula, and bilateral lower lobes, likely worsening multifocal pneumonia. Decreased but persistent scattered ground-glass and tree-in-bud nodules within all lobes, likely infectious/inflammatory. 3. Unchanged ascending thoracic aortic aneurysm measuring 4.9 cm. Ascending thoracic aortic aneurysm. Recommend semi-annual imaging followup by CTA or MRA and referral to cardiothoracic surgery if not already obtained. This recommendation follows 2010 ACCF/AHA/AATS/ACR/ASA/SCA/SCAI/SIR/STS/SVM Guidelines for the Diagnosis and Management of Patients With Thoracic Aortic Disease. Circulation. 2010; 121: Z733-z630. Aortic aneurysm NOS (ICD10-I71.9) 4. Aortic Atherosclerosis (ICD10-I70.0). Coronary artery calcifications. Assessment for potential risk factor  modification, dietary therapy or pharmacologic therapy may be warranted, if clinically indicated. Electronically Signed   By: Limin  Xu M.D.   On: 01/29/2024 13:29   US  Venous Img Lower Unilateral Right Result Date: 01/29/2024 CLINICAL DATA:  Right lower extremity swelling EXAM: RIGHT LOWER EXTREMITY VENOUS DOPPLER ULTRASOUND TECHNIQUE: Gray-scale sonography with compression, as well as color and duplex ultrasound, were performed to evaluate the deep venous system(s) from the level of the common femoral vein through the popliteal and proximal calf veins. COMPARISON:  None Available. FINDINGS: VENOUS Normal compressibility of the common femoral, superficial femoral, and popliteal veins, as well as the visualized calf veins. Visualized portions of profunda femoral vein and great saphenous vein  unremarkable. No filling defects to suggest DVT on grayscale or color Doppler imaging. Doppler waveforms show normal direction of venous flow, normal respiratory plasticity and response to augmentation. Limited views of the contralateral common femoral vein are unremarkable. OTHER None. Limitations: Calf veins not well visualized. IMPRESSION: No evidence of right lower extremity DVT. Electronically Signed   By: Franky Crease M.D.   On: 01/29/2024 12:41   DG Chest Port 1 View Result Date: 01/29/2024 CLINICAL DATA:  Shortness of breath EXAM: PORTABLE CHEST 1 VIEW COMPARISON:  01/03/2024 FINDINGS: Worsening airspace opacity in the right mid lung, suspicious for active pneumonia. Continued bandlike opacities at the left lung worsened right mid lung airspace opacity. Bandlike left basilar airspace opacities compatible with atelectasis and/or pneumonia. Interstitial accentuation and mild hazy opacity at the right lung base, bronchopneumonia not excluded. Atherosclerotic calcification of the aortic arch. Thoracic aortic aneurysm. Mild blunting of the left costophrenic angle. IMPRESSION: 1. Worsening airspace opacity in the right mid  lung, suspicious for active pneumonia. 2. Bandlike opacities at the left lung base compatible with atelectasis and/or pneumonia. 3. Interstitial accentuation and mild hazy opacity at the right lung base, bronchopneumonia not excluded. 4. Mild blunting of the left costophrenic angle, possibly a small effusion. 5. Thoracic aortic aneurysm. Electronically Signed   By: Ryan Salvage M.D.   On: 01/29/2024 11:47    Procedures Procedures    Medications Ordered in ED Medications  oseltamivir  (TAMIFLU ) capsule 30 mg (30 mg Oral Given 01/29/24 1456)  sodium chloride  flush (NS) 0.9 % injection 3 mL (has no administration in time range)  sodium chloride  flush (NS) 0.9 % injection 3 mL (has no administration in time range)  acetaminophen  (TYLENOL ) tablet 650 mg (has no administration in time range)    Or  acetaminophen  (TYLENOL ) suppository 650 mg (has no administration in time range)  oxyCODONE  (Oxy IR/ROXICODONE ) immediate release tablet 5 mg (has no administration in time range)  HYDROmorphone  (DILAUDID ) injection 0.5-1 mg (has no administration in time range)  senna-docusate (Senokot-S) tablet 1 tablet (has no administration in time range)  bisacodyl  (DULCOLAX) EC tablet 5 mg (has no administration in time range)  sodium phosphate  (FLEET) enema 1 enema (has no administration in time range)  ondansetron  (ZOFRAN ) tablet 4 mg (has no administration in time range)    Or  ondansetron  (ZOFRAN ) injection 4 mg (has no administration in time range)  ipratropium (ATROVENT ) nebulizer solution 0.5 mg (0.5 mg Nebulization Patient Refused/Not Given 01/29/24 1605)  hydrALAZINE  (APRESOLINE ) injection 10 mg (has no administration in time range)  levalbuterol  (XOPENEX ) nebulizer solution 1.25 mg (1.25 mg Nebulization Patient Refused/Not Given 01/29/24 1605)  methylPREDNISolone  sodium succinate (SOLU-MEDROL ) 125 mg/2 mL injection 60 mg (has no administration in time range)  budesonide  (PULMICORT ) nebulizer solution  0.25 mg (has no administration in time range)  chlorpheniramine-HYDROcodone  (TUSSIONEX) 10-8 MG/5ML suspension 5 mL (5 mLs Oral Given 01/29/24 1520)  guaiFENesin -dextromethorphan  (ROBITUSSIN DM) 100-10 MG/5ML syrup 10 mL (10 mLs Oral Given 01/29/24 1521)  amiodarone  (PACERONE ) tablet 200 mg (200 mg Oral Given 01/29/24 1802)  atorvastatin  (LIPITOR) tablet 20 mg (has no administration in time range)  furosemide  (LASIX ) tablet 20 mg (has no administration in time range)  verapamil  (CALAN -SR) CR tablet 240 mg (240 mg Oral Given 01/29/24 1802)  sertraline  (ZOLOFT ) tablet 50 mg (50 mg Oral Given 01/29/24 1520)  risperiDONE  (RISPERDAL ) tablet 0.25 mg (has no administration in time range)  finasteride  (PROSCAR ) tablet 5 mg (has no administration in time range)  melatonin tablet 6  mg (has no administration in time range)  feeding supplement (ENSURE ENLIVE / ENSURE PLUS) liquid 237 mL (has no administration in time range)  multivitamin with minerals tablet 1 tablet (has no administration in time range)  fluticasone  furoate-vilanterol (BREO ELLIPTA ) 200-25 MCG/ACT 1 puff (has no administration in time range)  apixaban  (ELIQUIS ) tablet 2.5 mg (has no administration in time range)  haloperidol  lactate (HALDOL ) injection 2 mg (2 mg Intravenous Given 01/29/24 1629)  traZODone  (DESYREL ) tablet 50 mg (has no administration in time range)  methylPREDNISolone  sodium succinate (SOLU-MEDROL ) 125 mg/2 mL injection 125 mg (125 mg Intravenous Given 01/29/24 1230)  ceFEPIme  (MAXIPIME ) 2 g in sodium chloride  0.9 % 100 mL IVPB (0 g Intravenous Stopped 01/29/24 1330)  vancomycin  (VANCOREADY) IVPB 1500 mg/300 mL (0 mg Intravenous Stopped 01/29/24 1629)  iohexol  (OMNIPAQUE ) 350 MG/ML injection 60 mL (60 mLs Intravenous Contrast Given 01/29/24 1244)    ED Course/ Medical Decision Making/ A&P Clinical Course as of 01/29/24 2021  Tue Jan 29, 2024  1312 Influenza A By PCR(!): POSITIVE [RP]  1427 Dr. Vicci to admit [RP]    Clinical Course  User Index [RP] Yolande Lamar BROCKS, MD                                 Medical Decision Making Amount and/or Complexity of Data Reviewed Labs: ordered. Decision-making details documented in ED Course. Radiology: ordered.  Risk Prescription drug management. Decision regarding hospitalization.   Kyle Owen is a 83 y.o. male with comorbidities that complicate the patient evaluation including dementia, COPD on 3 L home oxygen, atrial fibrillation on Eliquis , and thoracic aortic aneurysm who presents to the emergency department with shortness of breath and hypoxia.   Initial Ddx:  COPD exacerbation, pneumonia, URI, CHF, PE, DVT  MDM/Course:  Patient presents emergency department with shortness of breath and increased oxygen requirement.  Also on exam does have right lower extremity swelling.  Not appreciate any significant wheezing at this time but according to EMS he was wheezing prior to them giving him a neb.  Due to the leg swelling and shortness of breath with clear lungs as well as the fact that he has missed several doses of Eliquis  this week did obtain a lower extremity ultrasound and CTA of the chest to rule out DVT/PE.  These were negative for thromboembolism.  Did show that he has a multifocal pneumonia since he was in the hospital in the last 90 days was started on broad-spectrum antibiotics.  His flu swab returned as being positive as well which may be the cause of his symptoms.  Upon re-evaluation was stable.  Admitted to hospitalist for further management.  Was given Tamiflu .  This patient presents to the ED for concern of complaints listed in HPI, this involves an extensive number of treatment options, and is a complaint that carries with it a high risk of complications and morbidity. Disposition including potential need for admission considered.   Dispo: Admit to Floor  Additional history obtained from daughter Records reviewed Outpatient Clinic Notes The following labs  were independently interpreted: Chemistry and show CKD I independently reviewed the following imaging with scope of interpretation limited to determining acute life threatening conditions related to emergency care: Chest x-ray and agree with the radiologist interpretation with the following exceptions: none I personally reviewed and interpreted cardiac monitoring: normal sinus rhythm  I personally reviewed and interpreted the pt's EKG: see above  for interpretation  I have reviewed the patients home medications and made adjustments as needed Consults: Hospitalist Social Determinants of health:  Elderly  Portions of this note were generated with Scientist, clinical (histocompatibility and immunogenetics). Dictation errors may occur despite best attempts at proofreading.     Final Clinical Impression(s) / ED Diagnoses Final diagnoses:  Influenza A  Multifocal pneumonia  Hypoxia    Rx / DC Orders ED Discharge Orders     None         Yolande Lamar BROCKS, MD 01/29/24 2021

## 2024-01-29 NOTE — H&P (Signed)
 History and Physical   Patient: Kyle Owen                            PCP: Sheryle Carwin, MD                    DOB: January 18, 1941            DOA: 01/29/2024 FMW:[id number]             DOS: 01/29/2024, 5:04 PM  Sheryle Carwin, MD  Patient coming from:   HOME  I have personally reviewed patient's medical records, in electronic medical records, including:  [place] [facility], and care everywhere.    Chief Complaint:   Chief Complaint  Patient presents with   Weakness    History of present illness:    Kyle Owen is a 83 year old male with extensive history of advanced dementia, chronic A-fib on Eliquis , BPH, prostate cancer, COPD oxygen dependent 4 L, depression, TIAs, HLD, HTN, OA, DJD, old pulmonary nodule, chronic thoracic aneurysm.... Presented today with progressive shortness of breath, hypoxia. Per ED report on arrival patient was satting 70s-80s-was placed on 5 L of oxygen, currently satting 95% Per PCP started doxycycline  and prednisone  yesterday.  Off Lasix  due to diarrhea.  Per daughter missed multiple dose of Eliquis  this week as patient is not feeling well. Confused agitated-poor historian   Patient Denies having: Chest Pain, Abd pain, N/V/D, headache, dizziness, lightheadedness,  Dysuria, Joint pain, rash, open wounds   ED evaluation: Blood pressure 115/66, pulse 68, resp. rate 17, height 6' (1.829 m), weight 82.1 kg, SpO2 95%. Labs: WBC 3.1, BUN 27, creatinine 1.64, calcium  8.7, glucose 128, BNP 116, lactic acid 1.0, 1.0  Influenza A positive Influenza B, SARS-CoV-2, RSV --negative  Chest x-ray: Worsening airspace opacity in the right mid lung, suspicious for active pneumonia CT angio negative for PE, worsening infiltrate possible multifocal pneumonia 3 nodules inflammatory versus infectious, thoracic aneurysm 4.9   Requested patient to be admitted acute on chronic respiratory failure.   Review of Systems: As per HPI, otherwise 10 point review of systems were  negative   Assessment / Plan:   Principal Problem:   Acute on chronic respiratory failure with hypoxia (HCC) Active Problems:   Paroxysmal atrial fibrillation (HCC)   COPD with chronic bronchitis and emphysema (HCC)   Pulmonary nodule, left   Thoracic ascending aortic aneurysm (HCC)   Advanced dementia (HCC)   Lobar pneumonia (HCC)   Chronic kidney disease, stage 3b (HCC)   Anxiety   History of stroke   Bladder cancer (HCC)   Hyperlipidemia   Atrial fibrillation with rapid ventricular response (HCC)      Assessment and Plan:   Influenza A positive Acute on chronic respiratory failure with hypoxia and hypercarbia-with underlying COPD and acute infection influenza A -Baseline O2 requirement 4 L>> currently on 5 L, satting 95% -Presented with hypoxia -Secondary to influenza A, pneumonia and COPD exacerbation --Continue O2 supplements keeping O2 greater than 90% -Continue bronchodilators -Just finished steroids on last admission unfortunately needing more steroids -planning for taper    SIRS Ruling out sepsis -Currently meeting SIRS criteria, or leukocytosis acute on chronic respiratory failure, influenza A induced pneumonia -Secondary to pneumonia -Lactic acid 1.1, WBC 3.1, creatinine 1.64, -01/29/2024 CTA: Reviewed negative for PE, consolidation in the posterior right upper lobe, lingula, bilateral lower lobe likely worsening multifocal pneumonia scattered groundglass and 3 but nodules in the lobe likely  inflammatory versus infection, thoracic aortic aneurysm 4.9 cm (On last CT 4.7 cm)  -ED initiated broad-spectrum antibiotics-vancomycin  and cefepime  As patient remained afebrile, no leukocytosis, lactic acid 0.1, obtain procalcitonin level Reassessing need for continuing antibiotics     Lobar pneumonia -Possible viral pneumonia -Reevaluating the need to continue antibiotics -PCT  -On last admission MRSA screen negative -finished 7 days antibiotics on  01/05/24 -COVID-19 PCR negative   Paroxysmal atrial fibrillation -Heart rate 68, sinus -Continue home medication of amiodarone  - long acting diltiazem  -Continue apixaban  -01/03/24 echo--EF 65-70%, no WMA, normal RVF, mild TR -remains in sinus on the day of d/c   Acute heart failure with preserved EF -Avoiding volume overload, monitoring I's and O's, daily weight -01/03/24 echo--EF 65-70%, no WMA, normal RVF, mild TR -Patient clinically euvolemic  -Patient is on 20 mg of Lasix  as needed-   Acute on CKD stage IIIb -Baseline creatinine 1.5-1.8 Lab Results  Component Value Date   CREATININE 1.64 (H) 01/29/2024   CREATININE 1.81 (H) 01/07/2024   CREATININE 1.61 (H) 01/06/2024   -Avoiding nephrotoxins, hypotension    Mixed hyperlipidemia -Continue statin   Thoracic ascending aortic aneurysm (HCC) -Chronic history of thoracic ascending aortic aneurysm -Based on CTA 4.7>>7.9  cm  -Follow-up as an outpatient and monitor closely   Major neurocognitive disorder -Patient experienced some episodes hospital delirium -However, patient was redirectable. -He was noted to be playing cards with his wife on multiple occasions during the hospitalization      ----------------------------------------------------------------------------------------------------------------------------- Consults called:  Palliative care ----------------------------------------------------------------------------------------------------------------------------- DVT prophylaxis:  TED hose Start: 01/29/24 1420 SCDs Start: 01/29/24 1420 apixaban  (ELIQUIS ) tablet 5 mg   Code Status:   Code Status: Full Code   Admission status: Patient will be admitted as Inpatient, with a greater than 2 midnight length of stay. Level of care: Stepdown   Family Communication:  none at bedside  (The above findings and plan of care has been discussed with patient in detail, the patient expressed understanding and agreement of  above plan)  --------------------------------------------------------------------------------------------------------------------------------------------------  Disposition Plan:  Anticipated 1-2 days Status is: Inpatient Remains inpatient appropriate because: Needing IV antibiotics, IV steroids, respiratory support   .   ----------------------------------------------------------------------------------------------------------------------  Allergies  Allergen Reactions   Flomax [Tamsulosin Hcl] Nausea And Vomiting and Other (See Comments)    Pt thought he was dying  DIZZY   Fentanyl  Other (See Comments)    Makes me combative  Pt states he's had it since then and was fine 10-19-23    Home MEDs:  Prior to Admission medications   Medication Sig Start Date End Date Taking? Authorizing Provider  acetaminophen  (TYLENOL ) 500 MG tablet Take 500 mg by mouth in the morning and at bedtime.   Yes [provider]  amiodarone  (PACERONE ) 200 MG tablet Take 200 mg by mouth 2 (two) times daily.   Yes [provider]  atorvastatin  (LIPITOR) 20 MG tablet TAKE 1 TABLET BY MOUTH DAILY. Patient taking differently: Take 20 mg by mouth daily. 05/09/18  Yes Jeffrie Oneil BROCKS, MD  budesonide -formoterol  (SYMBICORT ) 160-4.5 MCG/ACT inhaler INHALE 2 PUFFS INTO THE LUNGS TWICE DAILY. 11/09/23  Yes Darlean Ozell NOVAK, MD  cetirizine (ZYRTEC) 10 MG tablet Take 10 mg by mouth daily.   Yes [provider]  Cholecalciferol  (VITAMIN D3) 25 MCG (1000 UT) CAPS Take by mouth. 01/21/24 02/20/24 Yes [provider]  doxycycline  (VIBRA -TABS) 100 MG tablet Take 100 mg by mouth 2 (two) times daily. 7 day suppy 01/28/24  Yes [provider]  ELIQUIS  5 MG TABS tablet Take 5 mg by mouth 2 (two) times daily. 01/24/24  Yes [provider]  finasteride  (PROSCAR ) 5 MG tablet Take 5 mg by mouth in the morning.   Yes [provider]  furosemide  (LASIX ) 20 MG tablet Take 20 mg by  mouth daily as needed. 01/24/24  Yes [provider]  ipratropium-albuterol  (DUONEB) 0.5-2.5 (3) MG/3ML SOLN Inhale into the lungs. 01/20/24 02/19/24 Yes [provider]  Magnesium  250 MG TABS Take 250 mg by mouth in the morning and at bedtime.   Yes [provider]  Multiple Vitamins-Minerals (CENTRUM ADULT PO) Take 1 tablet by mouth daily.   Yes [provider]  predniSONE  (DELTASONE ) 20 MG tablet Take 20 mg by mouth 2 (two) times daily. 01/28/24  Yes [provider]  risperiDONE  (RISPERDAL ) 0.25 MG tablet Take 0.25 mg by mouth every 4 (four) hours as needed. 01/24/24  Yes [provider]  sertraline  (ZOLOFT ) 50 MG tablet Take 50 mg by mouth every morning.   Yes [provider]  traZODone  (DESYREL ) 50 MG tablet Take 50 mg by mouth daily.   Yes [provider]  verapamil  (CALAN -SR) 240 MG CR tablet Take 240 mg by mouth daily.   Yes [provider]  Cholecalciferol  (VITAMIN D ) 50 MCG (2000 UT) tablet Take 2,000 Units by mouth in the morning.    [provider]  feeding supplement (ENSURE ENLIVE / ENSURE PLUS) LIQD Take 237 mLs by mouth 2 (two) times daily between meals. 01/06/24   Evonnie Lenis, MD  ipratropium-albuterol  (DUONEB) 0.5-2.5 (3) MG/3ML SOLN Take 3 mLs by nebulization every 6 (six) hours as needed (as needed for shortness of breath or wheezing). 10/21/23 12/29/23  Arrien, Mauricio Daniel, MD  melatonin 3 MG TABS tablet Take 2 tablets (6 mg total) by mouth at bedtime. 01/06/24   Evonnie Lenis, MD    PRN MEDs: acetaminophen  **OR** acetaminophen , bisacodyl , furosemide , haloperidol  lactate, hydrALAZINE , HYDROmorphone  (DILAUDID ) injection, ondansetron  **OR** ondansetron  (ZOFRAN ) IV, oxyCODONE , risperiDONE , senna-docusate, sodium phosphate , traZODone   Past Medical History:  Diagnosis Date   A-fib (HCC)    Anticoagulant long-term use    eliquis --- managed by cardiology   Benign prostatic hypertrophy with urinary  frequency    Bladder cancer (HCC)    Chronic respiratory failure with hypoxia (HCC)    Complication of anesthesia    gets combative in recovery   COPD with chronic bronchitis and emphysema (HCC)    pulmonologist--- dr jude;  nocturnal oxygen,  no daytime oxygen use   Dementia (HCC)    Dependence on nocturnal oxygen therapy    Depression    Dyspnea    when walking   History of colon polyps    History of kidney stones    History of loop recorder    loop recorder in place battery is dead has not had changed due to covid   History of TIA (transient ischemic attack)    (per pt no residual ) per neurologist note (dr margaret 11-08-2015) dx cryptogenic TIA versus arrhythia versus dysautonomia (right ophthalmic artery TIA and x3 posterior circulation TIAs   Hyperlipidemia    Hypertension    Pt denies   Malignant neoplasm of overlapping sites of bladder [facility])    urologist--- dr matilda   Multinodular thyroid     per pathology report 11-12-2014 bilateral thyroid   benign multinoduler follicular adenoma and hyperplastic    Nephrolithiasis    OA (osteoarthritis)    DJD right knee   Ocular migraine  controlled w/ verapamil    On home oxygen therapy    PAF (paroxysmal atrial fibrillation) (HCC)    followed by AFIB clinic-- cardiologist-- dr jeffrie   Pneumonia    2023   Pulmonary nodule, left    left lower lobe x2 per CT 01-20-2016   Renal cyst, left    Stroke [facility])    Thoracic aortic aneurysm without rupture [facility])    followed by dr fleeta brooke ;   ascending -- ct chest 01-24-2023   4.8cm   Wears hearing aid in both ears    wear at times    Past Surgical History:  Procedure Laterality Date   ANTERIOR CERVICAL DECOMP/DISCECTOMY FUSION N/A 04/03/2017   Procedure: Cervical three-four, Cervical four-five Anterior cervical decompression/discectomy/fusion;  Surgeon: Fairy Levels, MD;  Location: [facility] OR;  Service: Neurosurgery;  Laterality: N/A;   CATARACT EXTRACTION W/ INTRAOCULAR LENS  IMPLANT Right 2016   CATARACT EXTRACTION W/PHACO  12/16/2012   Procedure: CATARACT EXTRACTION PHACO AND INTRAOCULAR LENS PLACEMENT (IOC);  Surgeon: Cherene Mania, MD;  Location: [facility] ORS;  Service: Ophthalmology;  Laterality: Left;  CDE:17.31   COLONOSCOPY  10/03/2011   Procedure: COLONOSCOPY;  Surgeon: Oneil DELENA Budge;  Location: [facility] END[facility] SUITE;  Service: Gastroenterology;  Laterality: N/A;   CYSTOSCOPY W/ RETROGRADES Bilateral 04/12/2023   Procedure: CYSTOSCOPY WITH RETROGRADE PYELOGRAM;  Surgeon: Matilda Senior, MD;  Location: [facility]S;  Service: Urology;  Laterality: Bilateral;   CYSTOSCOPY W/ RETROGRADES Bilateral 11/12/2023   Procedure: CYSTOSCOPY WITH RETROGRADE PYELOGRAM;  Surgeon: Matilda Senior, MD;  Location: [facility]S;  Service: Urology;  Laterality: Bilateral;   CYSTOSCOPY WITH BIOPSY N/A 04/12/2023   Procedure: CYSTOSCOPY WITH BLADDER BIOPSY;  Surgeon: Matilda Senior, MD;  Location: [facility];  Service: Urology;  Laterality: N/A;  45 MINS   CYSTOSCOPY WITH BIOPSY N/A 11/12/2023   Procedure: CYSTOSCOPY WITH BLADDER  BIOPSY AND PROSTATIC URETHRA;  Surgeon: Matilda Senior, MD;  Location: [facility];  Service: Urology;  Laterality: N/A;   CYSTOSCOPY/RETROGRADE/URETEROSCOPY/STONE EXTRACTION WITH BASKET Right 03/29/2020   Procedure: CYSTOSCOPY/RETROGRADE/URETEROSCOPY/STONE EXTRACTION WITH BASKET;  Surgeon: Matilda Senior, MD;  Location: [facility];  Service: Urology;  Laterality: Right;  1 HR   DECOMPRESSIOIN ULNAR NERVE AND CUBITAL TUNNEL RELEASE Left 07/14/2009   elbow   EP IMPLANTABLE DEVICE N/A 08/10/2015   MDT ILR implanted by Dr Kelsie for cryptogenic stroke   HOLMIUM LASER APPLICATION Right 03/29/2020   Procedure: HOLMIUM LASER APPLICATION;  Surgeon: Matilda Senior, MD;  Location: [facility];  Service: Urology;  Laterality: Right;   INGUINAL HERNIA REPAIR Right 1990's   KNEE ARTHROSCOPY Right 2015   POSTERIOR LUMBAR FUSION     2014   L3--4;    12/ 2018   L4--5   SHOULDER  ARTHROSCOPY Left 1990's   TEE WITHOUT CARDIOVERSION N/A 07/27/2015   Procedure: TRANSESOPHAGEAL ECHOCARDIOGRAM (TEE);  Surgeon: Redell GORMAN Shallow, MD;  Location: [facility] ENDOSCOPY;  Service: Cardiovascular;  Laterality: N/A;   normal LV function, ef 55-60%,  mild AR and MR, mild dilated ascending aorta (4.2cm),  mild to moderate atherosclerosis  descending aorta,  mild to moderate TR,  negative saline microcavitation study   THYROIDECTOMY Right 01/20/2015   Procedure: RIGHT THYROIDECTOMY;  Surgeon: Ana LELON Moccasin, MD;  Location: [facility] OR;  Service: ENT;  Laterality: Right;   TONSILLECTOMY  as child   TOTAL HIP ARTHROPLASTY Left 12/14/2020   Procedure: TOTAL HIP ARTHROPLASTY ANTERIOR APPROACH;  Surgeon: Beverley Evalene BIRCH, MD;  Location: [facility];  Service: Orthopedics;  Laterality: Left;  TRANSURETHRAL RESECTION OF BLADDER TUMOR N/A 05/15/2016   Procedure: TRANSURETHRAL RESECTION OF BLADDER TUMOR (TURBT) AND INSTILLATION OF EPIRUBICIN ;  Surgeon: Garnette Shack, MD;  Location: [facility];  Service: Urology;  Laterality: N/A;   TRANSURETHRAL RESECTION OF BLADDER TUMOR N/A 03/29/2020   Procedure: TRANSURETHRAL RESECTION OF BLADDER TUMOR (TURBT);  Surgeon: Shack Garnette, MD;  Location: [facility];  Service: Urology;  Laterality: N/A;   TRANSURETHRAL RESECTION OF BLADDER TUMOR WITH MITOMYCIN -C N/A 01/23/2022   Procedure: TRANSURETHRAL RESECTION OF BLADDER TUMOR WITH POST OPERATIVE GEMCITABINE  INSTILLATION;  Surgeon: Shack Garnette, MD;  Location: [facility];  Service: Urology;  Laterality: N/A;   URETEROSCOPY Left 11/12/2023   Procedure: URETEROSCOPY;  Surgeon: Shack Garnette, MD;  Location: [facility];  Service: Urology;  Laterality: Left;     reports that he quit smoking about 13 years ago. His smoking use included cigarettes. He started smoking about 58 years ago. He has a 45 pack-year smoking history. He has never used smokeless tobacco. He reports that he does not drink alcohol and does not  use drugs.   Family History  Problem Relation Age of Onset   Stroke Sister    Breast cancer Sister    Stroke Brother     Physical Exam:   Vitals:   01/29/24 1040 01/29/24 1044 01/29/24 1237 01/29/24 1300  BP:    115/66  Pulse:   67 68  Resp:   18 17  SpO2: 94%  95% 95%  Weight:  82.1 kg    Height:  6' (1.829 m)     Constitutional: w SOB-chronically ill in manage discharged Eyes: PERRL, lids and conjunctivae normal ENMT: Mucous membranes are moist. Posterior pharynx clear of any exudate or lesions.Normal dentition.  Neck: normal, supple, no masses, no thyromegaly Respiratory: clear to auscultation bilaterally, no wheezing, no crackles. Normal respiratory effort. No accessory muscle use.  Cardiovascular: Regular rate and rhythm, no murmurs / rubs / gallops. No extremity edema. 2+ pedal pulses. No carotid bruits.  Abdomen: no tenderness, no masses palpated. No hepatosplenomegaly. Bowel sounds positive.  Musculoskeletal: no clubbing / cyanosis. No joint deformity upper and lower extremities. Good ROM, no contractures. Normal muscle tone.  Neurologic: Limited, confused CN II-XII grossly intact. Sensation intact, DTR normal. Strength 5/5 in all 4.  Psychiatric: Agitated confused,  Skin: no rashes, lesions, ulcers. No induration Wounds: per nursing documentation     Labs on admission:    I have personally reviewed following labs and imaging studies  CBC: Recent Labs  Lab 01/29/24 1111  WBC 3.1*  HGB 12.5*  HCT 41.1  MCV 98.3  PLT 167   Basic Metabolic Panel: Recent Labs  Lab 01/29/24 1111  NA 137  K 3.9  CL 97*  CO2 28  GLUCOSE 128*  BUN 27*  CREATININE 1.64*  CALCIUM  8.5*  MG 2.0  PHOS 3.1   GFR: Estimated Creatinine Clearance: 37.5 mL/min (A) (by C-G formula based on SCr of 1.64 mg/dL (H)). Liver Function Tests: Recent Labs  Lab 01/29/24 1111  AST 26  ALT 13  ALKPHOS 48  BILITOT 0.7  PROT 6.7  ALBUMIN 3.4*    Urine analysis:    Component  Value Date/Time   COLORURINE YELLOW 06/13/2016 1120   APPEARANCEUR Clear 12/04/2023 1539   LABSPEC 1.020 06/13/2016 1120   PHURINE 5.5 06/13/2016 1120   GLUCOSEU Negative 12/04/2023 1539   HGBUR NEGATIVE 06/13/2016 1120   BILIRUBINUR Negative 12/04/2023 1539   KETONESUR NEGATIVE 06/13/2016 1120   PROTEINUR Negative  12/04/2023 1539   PROTEINUR NEGATIVE 06/13/2016 1120   UROBILINOGEN negative (A) 05/04/2020 0930   NITRITE Negative 12/04/2023 1539   NITRITE NEGATIVE 06/13/2016 1120   LEUKOCYTESUR Trace (A) 12/04/2023 1539    Last A1C:  Lab Results  Component Value Date   HGBA1C 6.0 (H) 06/09/2015     Radiologic Exams on Admission:   CT Angio Chest PE W and/or Wo Contrast Result Date: 01/29/2024 CLINICAL DATA:  Hypoxia and weakness EXAM: CT ANGIOGRAPHY CHEST WITH CONTRAST TECHNIQUE: Multidetector CT imaging of the chest was performed using the standard protocol during bolus administration of intravenous contrast. Multiplanar CT image reconstructions and MIPs were obtained to evaluate the vascular anatomy. RADIATION DOSE REDUCTION: This exam was performed according to the departmental dose-optimization program which includes automated exposure control, adjustment of the mA and/or kV according to patient size and/or use of iterative reconstruction technique. CONTRAST:  60mL OMNIPAQUE  IOHEXOL  350 MG/ML SOLN COMPARISON:  Same day chest radiograph, CTA chest dated 12/29/2023, CT chest dated 01/24/2023 FINDINGS: Cardiovascular: The study is high quality for the evaluation of pulmonary embolism. There are no filling defects in the central, lobar, segmental or subsegmental pulmonary artery branches to suggest acute pulmonary embolism. Ascending thoracic aorta measures 4.9 x 4.8 cm, unchanged from 01/24/2023. normal heart size. No significant pericardial fluid/thickening. Coronary artery calcifications and aortic atherosclerosis. Mediastinum/Nodes: Imaged thyroid  gland without nodules meeting criteria  for imaging follow-up by size. Normal esophagus. No pathologically enlarged axillary, supraclavicular, mediastinal, or hilar lymph nodes. Lungs/Pleura: The central airways are patent. Severe peribronchial wall thickening of the bilateral lower lobes. Interval increased irregular consolidation in the posterior right upper lobe, lingula, and bilateral lower lobes. Decreased but persistent scattered ground-glass and tree-in-bud nodules within all lobes. No pneumothorax. No pleural effusion. Upper abdomen: Calcified splenic granulomata. Subcentimeter hepatic segment 7 hypodensities, too small to characterize. Musculoskeletal: No acute or abnormal lytic or blastic osseous lesions. Multilevel degenerative changes of the thoracic spine. Review of the MIP images confirms the above findings. IMPRESSION: 1. No evidence of pulmonary embolism. 2. Interval increased irregular consolidation in the posterior right upper lobe, lingula, and bilateral lower lobes, likely worsening multifocal pneumonia. Decreased but persistent scattered ground-glass and tree-in-bud nodules within all lobes, likely infectious/inflammatory. 3. Unchanged ascending thoracic aortic aneurysm measuring 4.9 cm. Ascending thoracic aortic aneurysm. Recommend semi-annual imaging followup by CTA or MRA and referral to cardiothoracic surgery if not already obtained. This recommendation follows 2010 ACCF/AHA/AATS/ACR/ASA/SCA/SCAI/SIR/STS/SVM Guidelines for the Diagnosis and Management of Patients With Thoracic Aortic Disease. Circulation. 2010; 121: Z733-z630. Aortic aneurysm NOS (ICD10-I71.9) 4. Aortic Atherosclerosis (ICD10-I70.0). Coronary artery calcifications. Assessment for potential risk factor modification, dietary therapy or pharmacologic therapy may be warranted, if clinically indicated. Electronically Signed   By: Limin  Xu M.D.   On: 01/29/2024 13:29   US  Venous Img Lower Unilateral Right Result Date: 01/29/2024 CLINICAL DATA:  Right lower extremity  swelling EXAM: RIGHT LOWER EXTREMITY VENOUS DOPPLER ULTRASOUND TECHNIQUE: Gray-scale sonography with compression, as well as color and duplex ultrasound, were performed to evaluate the deep venous system(s) from the level of the common femoral vein through the popliteal and proximal calf veins. COMPARISON:  None Available. FINDINGS: VENOUS Normal compressibility of the common femoral, superficial femoral, and popliteal veins, as well as the visualized calf veins. Visualized portions of profunda femoral vein and great saphenous vein unremarkable. No filling defects to suggest DVT on grayscale or color Doppler imaging. Doppler waveforms show normal direction of venous flow, normal respiratory plasticity and response to augmentation. Limited views  of the contralateral common femoral vein are unremarkable. OTHER None. Limitations: Calf veins not well visualized. IMPRESSION: No evidence of right lower extremity DVT. Electronically Signed   By: Franky Crease M.D.   On: 01/29/2024 12:41   DG Chest Port 1 View Result Date: 01/29/2024 CLINICAL DATA:  Shortness of breath EXAM: PORTABLE CHEST 1 VIEW COMPARISON:  01/03/2024 FINDINGS: Worsening airspace opacity in the right mid lung, suspicious for active pneumonia. Continued bandlike opacities at the left lung worsened right mid lung airspace opacity. Bandlike left basilar airspace opacities compatible with atelectasis and/or pneumonia. Interstitial accentuation and mild hazy opacity at the right lung base, bronchopneumonia not excluded. Atherosclerotic calcification of the aortic arch. Thoracic aortic aneurysm. Mild blunting of the left costophrenic angle. IMPRESSION: 1. Worsening airspace opacity in the right mid lung, suspicious for active pneumonia. 2. Bandlike opacities at the left lung base compatible with atelectasis and/or pneumonia. 3. Interstitial accentuation and mild hazy opacity at the right lung base, bronchopneumonia not excluded. 4. Mild blunting of the left  costophrenic angle, possibly a small effusion. 5. Thoracic aortic aneurysm. Electronically Signed   By: Ryan Salvage M.D.   On: 01/29/2024 11:47    EKG:   Independently reviewed.  Orders placed or performed during the hospital encounter of 01/29/24   ED EKG   ED EKG   EKG 12-Lead   EKG 12-Lead   EKG 12-Lead   ----------------------------------------------------------------------------------------------------------------------------------------------------  Time spent:  34  Min.  Was spent seeing and evaluating the patient, reviewing all medical records, drawn plan of care.  SIGNED: Adriana DELENA Grams, MD, FHM. FAAFP. [place] [facility], Pager  (Please use [email or web address] to page/ or secure chat through epic) If 7PM-7AM, please contact night-coverage [email or web address],  01/29/2024, 5:04 PM

## 2024-01-29 NOTE — ED Notes (Signed)
Hadol given to pt to help to clam him, he is currently winding down and is a little more relaxed.

## 2024-01-30 DIAGNOSIS — J9621 Acute and chronic respiratory failure with hypoxia: Secondary | ICD-10-CM | POA: Diagnosis not present

## 2024-01-30 DIAGNOSIS — J9620 Acute and chronic respiratory failure, unspecified whether with hypoxia or hypercapnia: Secondary | ICD-10-CM | POA: Diagnosis present

## 2024-01-30 LAB — CBG MONITORING, ED: Glucose-Capillary: 125 mg/dL — ABNORMAL HIGH (ref 70–99)

## 2024-01-30 LAB — BASIC METABOLIC PANEL
Anion gap: 11 (ref 5–15)
BUN: 31 mg/dL — ABNORMAL HIGH (ref 8–23)
CO2: 27 mmol/L (ref 22–32)
Calcium: 8.6 mg/dL — ABNORMAL LOW (ref 8.9–10.3)
Chloride: 100 mmol/L (ref 98–111)
Creatinine, Ser: 1.67 mg/dL — ABNORMAL HIGH (ref 0.61–1.24)
GFR, Estimated: 40 mL/min — ABNORMAL LOW (ref >60)
Glucose, Bld: 171 mg/dL — ABNORMAL HIGH (ref 70–99)
Potassium: 4.3 mmol/L (ref 3.5–5.1)
Sodium: 138 mmol/L (ref 135–145)

## 2024-01-30 LAB — CBC
HCT: 37.7 % — ABNORMAL LOW (ref 39.0–52.0)
Hemoglobin: 11.8 g/dL — ABNORMAL LOW (ref 13.0–17.0)
MCH: 30.5 pg (ref 26.0–34.0)
MCHC: 31.3 g/dL (ref 30.0–36.0)
MCV: 97.4 fL (ref 80.0–100.0)
Platelets: 164 10*3/uL (ref 150–400)
RBC: 3.87 MIL/uL — ABNORMAL LOW (ref 4.22–5.81)
RDW: 13.5 % (ref 11.5–15.5)
WBC: 1.9 10*3/uL — ABNORMAL LOW (ref 4.0–10.5)
nRBC: 0 % (ref 0.0–0.2)

## 2024-01-30 NOTE — TOC Progression Note (Signed)
 Transition of Care [facility]) - Progression Note    Patient Details  Name: Kyle Owen MRN: [id number] Date of Birth: 16-Dec-1941  Transition of Care [facility]) CM/SW Contact  Sharlyne Stabs, RN Phone Number: 01/30/2024, 2:04 PM  Clinical Narrative:   Patient admitted from home with acute on chronic respiratory failure with hypoxia. Medical work up continues. PT eval pending. Patient was discharged to [facility] for rehab in Jan and on 3L home oxygen. TOC following.    Barriers to Discharge: Continued Medical Work up  Expected Discharge Plan and Services      Living arrangements for the past 2 months: Single Family Home      Social Determinants of Health (SDOH) Interventions SDOH Screenings   Food Insecurity: No Food Insecurity (01/30/2024)  Housing: Low Risk  (01/30/2024)  Transportation Needs: No Transportation Needs (01/30/2024)  Utilities: Not At Risk (01/30/2024)  Social Connections: Moderately Integrated (01/30/2024)  Tobacco Use: Medium Risk (01/29/2024)  Health Literacy: High Risk (01/15/2024)   Received from [facility]    Readmission Risk Interventions    01/30/2024    2:02 PM 12/30/2023   12:30 PM  Readmission Risk Prevention Plan  Transportation Screening Complete Complete  Medication Review (RN Care Manager) Complete Complete  PCP or Specialist appointment within 3-5 days of discharge Not Complete   HRI or Home Care Consult Complete Complete  SW Recovery Care/Counseling Consult  Complete  Palliative Care Screening  Not Applicable  Skilled Nursing Facility  Not Applicable

## 2024-01-30 NOTE — Evaluation (Signed)
 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  Presented today with progressive shortness of breath, hypoxia.  Per ED report on arrival patient was satting 70s-80s-was placed on 5 L of oxygen, currently satting 95%  Per PCP started doxycycline  and prednisone  yesterday.  Off Lasix  due to diarrhea.   Per daughter missed multiple dose of Eliquis  this week as patient is not feeling well.  Confused agitated-poor historian   Clinical Impression  Patient demonstrates slow labored movement for sitting up at bedside, fair return for using RW during sit to stands, able to take steps in room with slow labored cadence, limited mostly due to fatigue and SOB with SpO2 dropping from 88% to 82% while on 4 LPM O2 - nurse notified. Patient tolerated sitting up in chair after therapy with family members in room.  Patient will benefit from continued skilled physical therapy in hospital and recommended venue below to increase strength, balance, endurance for safe ADLs and gait.          If plan is discharge home, recommend the following: A lot of help with bathing/dressing/bathroom;A lot of help with walking and/or transfers;Help with stairs or ramp for entrance;Assistance with cooking/housework   Can travel by private vehicle        Equipment Recommendations None recommended by PT  Recommendations for Other Services       Functional Status Assessment Patient has had a recent decline in their functional status and demonstrates the ability to make significant improvements in function in a reasonable and predictable amount of time.     Precautions / Restrictions  Precautions Precautions: Fall Restrictions Weight Bearing Restrictions Per Provider Order: No      Mobility  Bed Mobility Overal bed mobility: Needs Assistance Bed Mobility: Supine to Sit     Supine to sit: Min assist, Mod assist     General bed mobility comments: increased time, labored movement    Transfers Overall transfer level: Needs assistance Equipment used: Rolling walker (2 wheels) Transfers: Sit to/from Stand, Bed to chair/wheelchair/BSC Sit to Stand: Min assist   Step pivot transfers: Min assist       General transfer comment: repeated verbal/tactile cueing, labored movement    Ambulation/Gait Ambulation/Gait assistance: Min assist, Mod assist Gait Distance (Feet): 30 Feet Assistive device: Rolling walker (2 wheels) Gait Pattern/deviations: Decreased step length - right, Decreased step length - left, Decreased stride length, Trunk flexed Gait velocity: decreased     General Gait Details: slow labored cadence without loss of balance, limited mostly due to fatigue and SpO2 dropping from 88% to 82%, SOB  Stairs            Wheelchair Mobility     Tilt Bed    Modified Rankin (Stroke Patients Only)       Balance Overall balance assessment: Needs assistance Sitting-balance support: Feet supported, No upper extremity supported Sitting balance-Leahy Scale: Fair Sitting balance - Comments: fair/good seated at EOB   Standing balance support: Reliant on assistive device for balance, During functional activity, Bilateral upper extremity supported Standing balance-Leahy Scale: Poor Standing balance comment: fair/poor using RW  Pertinent Vitals/Pain Pain Assessment Pain Assessment: No/denies pain    Home Living Family/patient expects to be discharged to:: Private residence Living Arrangements: Spouse/significant other;Children Available Help at Discharge: Family Type of Home: House Home Access: Stairs to  enter Entrance Stairs-Rails: Right;Left;Can reach both Entrance Stairs-Number of Steps: 2   Home Layout: Two level;Able to live on main level with bedroom/bathroom Home Equipment: Rolling Walker (2 wheels);Cane - single point;BSC/3in1;Grab bars - tub/shower;Grab bars - toilet      Prior Function Prior Level of Function : Needs assist       Physical Assist : ADLs (physical);Mobility (physical) Mobility (physical): Bed mobility;Transfers;Gait;Stairs ADLs (physical): IADLs;Bathing Mobility Comments: household ambulation using SPC ADLs Comments: Assisted by family     Extremity/Trunk Assessment   Upper Extremity Assessment Upper Extremity Assessment: Defer to OT evaluation    Lower Extremity Assessment Lower Extremity Assessment: Generalized weakness    Cervical / Trunk Assessment Cervical / Trunk Assessment: Kyphotic  Communication   Communication Communication: Hearing impairment Cueing Techniques: Verbal cues;Tactile cues  Cognition Arousal: Alert Behavior During Therapy: Anxious, Impulsive Overall Cognitive Status: History of cognitive impairments - at baseline                                          General Comments      Exercises     Assessment/Plan    PT Assessment Patient needs continued PT services  PT Problem List Decreased strength;Decreased activity tolerance;Decreased balance;Decreased mobility       PT Treatment Interventions DME instruction;Gait training;Stair training;Functional mobility training;Therapeutic activities;Therapeutic exercise;Balance training;Patient/family education    PT Goals (Current goals can be found in the Care Plan section)  Acute Rehab PT Goals Patient Stated Goal: return hom with family to assist PT Goal Formulation: With patient/family Time For Goal Achievement: 02/06/24 Potential to Achieve Goals: Good    Frequency Min 3X/week     Co-evaluation               AM-PAC PT 6 Clicks  Mobility  Outcome Measure Help needed turning from your back to your side while in a flat bed without using bedrails?: A Little Help needed moving from lying on your back to sitting on the side of a flat bed without using bedrails?: A Little Help needed moving to and from a bed to a chair (including a wheelchair)?: A Lot Help needed standing up from a chair using your arms (e.g., wheelchair or bedside chair)?: A Little Help needed to walk in hospital room?: A Lot Help needed climbing 3-5 steps with a railing? : A Lot 6 Click Score: 15    End of Session Equipment Utilized During Treatment: Oxygen Activity Tolerance: Patient tolerated treatment well;Patient limited by fatigue Patient left: in chair;with call bell/phone within reach;with family/visitor present;with chair alarm set Nurse Communication: Mobility status PT Visit Diagnosis: Unsteadiness on feet (R26.81);Other abnormalities of gait and mobility (R26.89);Muscle weakness (generalized) (M62.81)    Time: 8569-8497 PT Time Calculation (min) (ACUTE ONLY): 32 min   Charges:   PT Evaluation $PT Eval Moderate Complexity: 1 Mod PT Treatments $Therapeutic Exercise: 23-37 mins PT General Charges $$ ACUTE PT VISIT: 1 Visit         3:49 PM, 01/30/24 Lynwood Music, MPT Physical Therapist with [facility] [phone] office [phone] mobile phone

## 2024-01-30 NOTE — ED Notes (Signed)
 Pt resting, family sitting at bedside, no complaints at this time.

## 2024-01-30 NOTE — ED Notes (Signed)
 Patient attempted to get out of bed, and refusing to listen to wife and staff. This nurse and tech assisted patient back in bed.  Patient stated " I need to go to surgery." Was able to get patient to take medications.

## 2024-01-30 NOTE — Plan of Care (Signed)
  Problem: Acute Rehab PT Goals(only PT should resolve) Goal: Pt will Roll Supine to Side Outcome: Progressing Flowsheets (Taken 01/30/2024 1551) Pt will Roll Supine to Side:  with min assist  with supervision Goal: Patient Will Transfer Sit To/From Stand Outcome: Progressing Flowsheets (Taken 01/30/2024 1551) Patient will transfer sit to/from stand:  with minimal assist  with contact guard assist Goal: Pt Will Transfer Bed To Chair/Chair To Bed Outcome: Progressing Flowsheets (Taken 01/30/2024 1551) Pt will Transfer Bed to Chair/Chair to Bed:  with min assist  with contact guard assist Goal: Pt Will Ambulate Outcome: Progressing Flowsheets (Taken 01/30/2024 1551) Pt will Ambulate:  50 feet  with minimal assist  with contact guard assist  with rolling walker   3:51 PM, 01/30/24 Lynwood Music, MPT Physical Therapist with [facility] [phone] office [phone] mobile phone

## 2024-01-30 NOTE — Progress Notes (Signed)
 PROGRESS NOTE    RADEN BYINGTON  FMW:[id number] DOB: 1941-08-07 DOA: 01/29/2024 PCP: Sheryle Carwin, MD   Brief Narrative:    Kyle Owen is a 83 year old male with extensive history of advanced dementia, chronic A-fib on Eliquis , BPH, prostate cancer, COPD oxygen dependent 4 L, depression, TIAs, HLD, HTN, OA, DJD, old pulmonary nodule, chronic thoracic aneurysm. Presented with progressive shortness of breath, hypoxia. Per ED report on arrival patient was satting 70s-80s-was placed on 5 L of oxygen, currently satting 95% Per PCP started doxycycline  and prednisone  yesterday.  Off Lasix  due to diarrhea.  Per daughter missed multiple dose of Eliquis  this week as patient is not feeling well. Confused agitated-poor historian.  Patient was admitted with acute on chronic hypoxemic respiratory failure with hypercarbia secondary to COPD exacerbation in the setting of influenza A infection.  Assessment & Plan:   Principal Problem:   Acute on chronic respiratory failure with hypoxia (HCC) Active Problems:   Paroxysmal atrial fibrillation (HCC)   COPD with chronic bronchitis and emphysema (HCC)   Pulmonary nodule, left   Thoracic ascending aortic aneurysm (HCC)   Advanced dementia (HCC)   Lobar pneumonia (HCC)   Chronic kidney disease, stage 3b (HCC)   Anxiety   History of stroke   Bladder cancer (HCC)   Hyperlipidemia   Atrial fibrillation with rapid ventricular response (HCC)  Assessment and Plan:   Influenza A positive Acute on chronic respiratory failure with hypoxia and hypercarbia-with underlying COPD and acute infection influenza A -Baseline O2 requirement 4 L>> currently on 5 L, satting 95% -Presented with hypoxia -Secondary to influenza A, pneumonia and COPD exacerbation --Continue O2 supplements keeping O2 greater than 90% -Continue bronchodilators -Just finished steroids on last admission unfortunately needing more steroids -planning for taper -Procalcitonin low, no need  for antibiotics, last finished 7-day course 01/05/2024   Paroxysmal atrial fibrillation -Heart rate 68, sinus -Continue home medication of amiodarone  - long acting diltiazem  -Continue apixaban  -01/03/24 echo--EF 65-70%, no WMA, normal RVF, mild TR -remains in sinus on the day of d/c   Acute heart failure with preserved EF -Avoiding volume overload, monitoring I's and O's, daily weight -01/03/24 echo--EF 65-70%, no WMA, normal RVF, mild TR -Patient clinically euvolemic  -Patient is on 20 mg of Lasix  as needed-   Acute on CKD stage IIIb -Baseline creatinine 1.5-1.8 -Avoiding nephrotoxins, hypotension     Mixed hyperlipidemia -Continue statin   Thoracic ascending aortic aneurysm (HCC) -Chronic history of thoracic ascending aortic aneurysm -Based on CTA 4.7>>7.9  cm  -Follow-up as an outpatient and monitor closely   Major neurocognitive disorder -Patient experienced some episodes hospital delirium -However, patient was redirectable. -He was noted to be playing cards with his wife on multiple occasions during the hospitalization -PT evaluation pending     DVT prophylaxis:apixaban  Code Status: DNR Family Communication: None at bedside Disposition Plan:  Status is: Inpatient Remains inpatient appropriate because: Need for IV medications   Consultants:  Palliative care  Procedures:  None  Antimicrobials:  Anti-infectives (From admission, onward)    Start     Dose/Rate Route Frequency Ordered Stop   01/29/24 1400  oseltamivir  (TAMIFLU ) capsule 30 mg        30 mg Oral 2 times daily 01/29/24 1351 02/03/24 0959   01/29/24 1215  vancomycin  (VANCOREADY) IVPB 1500 mg/300 mL        1,500 mg 150 mL/hr over 120 Minutes Intravenous  Once 01/29/24 1213 01/29/24 1629   01/29/24 1200  vancomycin  (VANCOCIN ) IVPB 1000  mg/200 mL premix  Status:  Discontinued        1,000 mg 200 mL/hr over 60 Minutes Intravenous  Once 01/29/24 1159 01/29/24 1213   01/29/24 1200  ceFEPIme  (MAXIPIME ) 2 g  in sodium chloride  0.9 % 100 mL IVPB        2 g 200 mL/hr over 30 Minutes Intravenous  Once 01/29/24 1159 01/29/24 1330       Subjective: Patient seen and evaluated today and is currently somnolent on nasal cannula oxygen with sitter at bedside.  Noted to have a restless night according to sitter.  Objective: Vitals:   01/30/24 0530 01/30/24 0600 01/30/24 0819 01/30/24 0845  BP:  (!) 141/84  120/65  Pulse: 62 63  64  Resp:  18  19  Temp:  98.2 F (36.8 C) 98 F (36.7 C)   TempSrc:   Oral   SpO2: 100% 100%  95%  Weight:      Height:        Intake/Output Summary (Last 24 hours) at 01/30/2024 0900 Last data filed at 01/29/2024 1629 Gross per 24 hour  Intake 265 ml  Output --  Net 265 ml   Filed Weights   01/29/24 1044  Weight: 82.1 kg    Examination:  General exam: Appears calm and comfortable  Respiratory system: Clear to auscultation. Respiratory effort normal.  4.5 L nasal cannula Cardiovascular system: S1 & S2 heard, RRR.  Gastrointestinal system: Abdomen is soft Central nervous system: Somnolent and difficult to arouse Extremities: No edema Skin: No significant lesions noted Psychiatry: Flat affect.    Data Reviewed: I have personally reviewed following labs and imaging studies  CBC: Recent Labs  Lab 01/29/24 1111 01/30/24 0313  WBC 3.1* 1.9*  HGB 12.5* 11.8*  HCT 41.1 37.7*  MCV 98.3 97.4  PLT 167 164   Basic Metabolic Panel: Recent Labs  Lab 01/29/24 1111 01/30/24 0313  NA 137 138  K 3.9 4.3  CL 97* 100  CO2 28 27  GLUCOSE 128* 171*  BUN 27* 31*  CREATININE 1.64* 1.67*  CALCIUM  8.5* 8.6*  MG 2.0  --   PHOS 3.1  --    GFR: Estimated Creatinine Clearance: 36.8 mL/min (A) (by C-G formula based on SCr of 1.67 mg/dL (H)). Liver Function Tests: Recent Labs  Lab 01/29/24 1111  AST 26  ALT 13  ALKPHOS 48  BILITOT 0.7  PROT 6.7  ALBUMIN 3.4*   No results for input(s): LIPASE, AMYLASE in the last 168 hours. No results for  input(s): AMMONIA in the last 168 hours. Coagulation Profile: No results for input(s): INR, PROTIME in the last 168 hours. Cardiac Enzymes: No results for input(s): CKTOTAL, CKMB, CKMBINDEX, TROPONINI in the last 168 hours. BNP (last 3 results) No results for input(s): PROBNP in the last 8760 hours. HbA1C: No results for input(s): HGBA1C in the last 72 hours. CBG: Recent Labs  Lab 01/30/24 0815  GLUCAP 125*   Lipid Profile: No results for input(s): CHOL, HDL, LDLCALC, TRIG, CHOLHDL, LDLDIRECT in the last 72 hours. Thyroid  Function Tests: No results for input(s): TSH, T4TOTAL, FREET4, T3FREE, THYROIDAB in the last 72 hours. Anemia Panel: No results for input(s): VITAMINB12, FOLATE, FERRITIN, TIBC, IRON, RETICCTPCT in the last 72 hours. Sepsis Labs: Recent Labs  Lab 01/29/24 1111 01/29/24 1236 01/29/24 1405  PROCALCITON <0.10  --   --   LATICACIDVEN  --  1.0 1.0    Recent Results (from the past 240 hours)  Resp panel by RT-PCR (  RSV, Flu A&B, Covid) Anterior Nasal Swab     Status: Abnormal   Collection Time: 01/29/24 11:05 AM   Specimen: Anterior Nasal Swab  Result Value Ref Range Status   SARS Coronavirus 2 by RT PCR NEGATIVE NEGATIVE Final    Comment: (NOTE) SARS-CoV-2 target nucleic acids are NOT DETECTED.  The SARS-CoV-2 RNA is generally detectable in upper respiratory specimens during the acute phase of infection. The lowest concentration of SARS-CoV-2 viral copies this assay can detect is 138 copies/mL. A negative result does not preclude SARS-Cov-2 infection and should not be used as the sole basis for treatment or other patient management decisions. A negative result may occur with  improper specimen collection/handling, submission of specimen other than nasopharyngeal swab, presence of viral mutation(s) within the areas targeted by this assay, and inadequate number of viral copies(<138 copies/mL). A negative  result must be combined with clinical observations, patient history, and epidemiological information. The expected result is Negative.  Fact Sheet for Patients:  bloggercourse.com  Fact Sheet for Healthcare Providers:  seriousbroker.it  This test is no t yet approved or cleared by the [place]  FDA and  has been authorized for detection and/or diagnosis of SARS-CoV-2 by FDA under an Emergency Use Authorization (EUA). This EUA will remain  in effect (meaning this test can be used) for the duration of the COVID-19 declaration under Section 564(b)(1) of the Act, 21 U.S.C.section 360bbb-3(b)(1), unless the authorization is terminated  or revoked sooner.       Influenza A by PCR POSITIVE (A) NEGATIVE Final   Influenza B by PCR NEGATIVE NEGATIVE Final    Comment: (NOTE) The Xpert Xpress SARS-CoV-2/FLU/RSV plus assay is intended as an aid in the diagnosis of influenza from Nasopharyngeal swab specimens and should not be used as a sole basis for treatment. Nasal washings and aspirates are unacceptable for Xpert Xpress SARS-CoV-2/FLU/RSV testing.  Fact Sheet for Patients: bloggercourse.com  Fact Sheet for Healthcare Providers: seriousbroker.it  This test is not yet approved or cleared by the [place]  FDA and has been authorized for detection and/or diagnosis of SARS-CoV-2 by FDA under an Emergency Use Authorization (EUA). This EUA will remain in effect (meaning this test can be used) for the duration of the COVID-19 declaration under Section 564(b)(1) of the Act, 21 U.S.C. section 360bbb-3(b)(1), unless the authorization is terminated or revoked.     Resp Syncytial Virus by PCR NEGATIVE NEGATIVE Final    Comment: (NOTE) Fact Sheet for Patients: bloggercourse.com  Fact Sheet for Healthcare  Providers: seriousbroker.it  This test is not yet approved or cleared by the [place]  FDA and has been authorized for detection and/or diagnosis of SARS-CoV-2 by FDA under an Emergency Use Authorization (EUA). This EUA will remain in effect (meaning this test can be used) for the duration of the COVID-19 declaration under Section 564(b)(1) of the Act, 21 U.S.C. section 360bbb-3(b)(1), unless the authorization is terminated or revoked.  Performed at [facility], [address] [place], [place] [address]   Blood culture (routine x 2)     Status: None (Preliminary result)   Collection Time: 01/29/24 12:36 PM   Specimen: BLOOD  Result Value Ref Range Status   Specimen Description BLOOD BLOOD RIGHT ARM AC  Final   Special Requests   Final    Blood Culture adequate volume BOTTLES DRAWN AEROBIC AND ANAEROBIC   Culture   Final    NO GROWTH < 24 HOURS Performed at [facility], [address] [place], [place] [address]  Report Status PENDING  Incomplete  Blood culture (routine x 2)     Status: None (Preliminary result)   Collection Time: 01/29/24 12:36 PM   Specimen: BLOOD  Result Value Ref Range Status   Specimen Description BLOOD BLOOD RIGHT ARM FOREARM  Final   Special Requests   Final    Blood Culture adequate volume BOTTLES DRAWN AEROBIC ONLY   Culture   Final    NO GROWTH < 24 HOURS Performed at [facility], [address], [place], [place] [address]    Report Status PENDING  Incomplete         Radiology Studies: CT Angio Chest PE W and/or Wo Contrast Result Date: 01/29/2024 CLINICAL DATA:  Hypoxia and weakness EXAM: CT ANGIOGRAPHY CHEST WITH CONTRAST TECHNIQUE: Multidetector CT imaging of the chest was performed using the standard protocol during bolus administration of intravenous contrast. Multiplanar CT image reconstructions and MIPs were obtained to evaluate the vascular anatomy. RADIATION DOSE REDUCTION: This exam was performed according  to the departmental dose-optimization program which includes automated exposure control, adjustment of the mA and/or kV according to patient size and/or use of iterative reconstruction technique. CONTRAST:  60mL OMNIPAQUE  IOHEXOL  350 MG/ML SOLN COMPARISON:  Same day chest radiograph, CTA chest dated 12/29/2023, CT chest dated 01/24/2023 FINDINGS: Cardiovascular: The study is high quality for the evaluation of pulmonary embolism. There are no filling defects in the central, lobar, segmental or subsegmental pulmonary artery branches to suggest acute pulmonary embolism. Ascending thoracic aorta measures 4.9 x 4.8 cm, unchanged from 01/24/2023. normal heart size. No significant pericardial fluid/thickening. Coronary artery calcifications and aortic atherosclerosis. Mediastinum/Nodes: Imaged thyroid  gland without nodules meeting criteria for imaging follow-up by size. Normal esophagus. No pathologically enlarged axillary, supraclavicular, mediastinal, or hilar lymph nodes. Lungs/Pleura: The central airways are patent. Severe peribronchial wall thickening of the bilateral lower lobes. Interval increased irregular consolidation in the posterior right upper lobe, lingula, and bilateral lower lobes. Decreased but persistent scattered ground-glass and tree-in-bud nodules within all lobes. No pneumothorax. No pleural effusion. Upper abdomen: Calcified splenic granulomata. Subcentimeter hepatic segment 7 hypodensities, too small to characterize. Musculoskeletal: No acute or abnormal lytic or blastic osseous lesions. Multilevel degenerative changes of the thoracic spine. Review of the MIP images confirms the above findings. IMPRESSION: 1. No evidence of pulmonary embolism. 2. Interval increased irregular consolidation in the posterior right upper lobe, lingula, and bilateral lower lobes, likely worsening multifocal pneumonia. Decreased but persistent scattered ground-glass and tree-in-bud nodules within all lobes, likely  infectious/inflammatory. 3. Unchanged ascending thoracic aortic aneurysm measuring 4.9 cm. Ascending thoracic aortic aneurysm. Recommend semi-annual imaging followup by CTA or MRA and referral to cardiothoracic surgery if not already obtained. This recommendation follows 2010 ACCF/AHA/AATS/ACR/ASA/SCA/SCAI/SIR/STS/SVM Guidelines for the Diagnosis and Management of Patients With Thoracic Aortic Disease. Circulation. 2010; 121: Z733-z630. Aortic aneurysm NOS (ICD10-I71.9) 4. Aortic Atherosclerosis (ICD10-I70.0). Coronary artery calcifications. Assessment for potential risk factor modification, dietary therapy or pharmacologic therapy may be warranted, if clinically indicated. Electronically Signed   By: Limin  Xu M.D.   On: 01/29/2024 13:29   US  Venous Img Lower Unilateral Right Result Date: 01/29/2024 CLINICAL DATA:  Right lower extremity swelling EXAM: RIGHT LOWER EXTREMITY VENOUS DOPPLER ULTRASOUND TECHNIQUE: Gray-scale sonography with compression, as well as color and duplex ultrasound, were performed to evaluate the deep venous system(s) from the level of the common femoral vein through the popliteal and proximal calf veins. COMPARISON:  None Available. FINDINGS: VENOUS Normal compressibility of the common femoral, superficial femoral, and popliteal veins, as  well as the visualized calf veins. Visualized portions of profunda femoral vein and great saphenous vein unremarkable. No filling defects to suggest DVT on grayscale or color Doppler imaging. Doppler waveforms show normal direction of venous flow, normal respiratory plasticity and response to augmentation. Limited views of the contralateral common femoral vein are unremarkable. OTHER None. Limitations: Calf veins not well visualized. IMPRESSION: No evidence of right lower extremity DVT. Electronically Signed   By: Franky Crease M.D.   On: 01/29/2024 12:41   DG Chest Port 1 View Result Date: 01/29/2024 CLINICAL DATA:  Shortness of breath EXAM: PORTABLE  CHEST 1 VIEW COMPARISON:  01/03/2024 FINDINGS: Worsening airspace opacity in the right mid lung, suspicious for active pneumonia. Continued bandlike opacities at the left lung worsened right mid lung airspace opacity. Bandlike left basilar airspace opacities compatible with atelectasis and/or pneumonia. Interstitial accentuation and mild hazy opacity at the right lung base, bronchopneumonia not excluded. Atherosclerotic calcification of the aortic arch. Thoracic aortic aneurysm. Mild blunting of the left costophrenic angle. IMPRESSION: 1. Worsening airspace opacity in the right mid lung, suspicious for active pneumonia. 2. Bandlike opacities at the left lung base compatible with atelectasis and/or pneumonia. 3. Interstitial accentuation and mild hazy opacity at the right lung base, bronchopneumonia not excluded. 4. Mild blunting of the left costophrenic angle, possibly a small effusion. 5. Thoracic aortic aneurysm. Electronically Signed   By: Ryan Salvage M.D.   On: 01/29/2024 11:47        Scheduled Meds:  amiodarone   200 mg Oral BID   apixaban   2.5 mg Oral BID   atorvastatin   20 mg Oral Daily   budesonide  (PULMICORT ) nebulizer solution  0.25 mg Nebulization BID   chlorpheniramine-HYDROcodone   5 mL Oral Q12H   feeding supplement  237 mL Oral BID BM   finasteride   5 mg Oral q AM   fluticasone  furoate-vilanterol  1 puff Inhalation Daily   guaiFENesin -dextromethorphan   10 mL Oral Q8H   ipratropium  0.5 mg Nebulization BID   levalbuterol   1.25 mg Nebulization BID   melatonin  6 mg Oral QHS   methylPREDNISolone  sodium succinate  60 mg Intravenous Q12H   multivitamin with minerals  1 tablet Oral Daily   oseltamivir   30 mg Oral BID   sertraline   50 mg Oral q morning   sodium chloride  flush  3 mL Intravenous Q12H   sodium chloride  flush  3 mL Intravenous Q12H   verapamil   240 mg Oral Daily     LOS: 1 day    Time spent: 55 minutes    Trendon Zaring JONETTA Fairly, DO [facility]  If  7PM-7AM, please contact night-coverage [email or web address] 01/30/2024, 9:00 AM

## 2024-01-31 DIAGNOSIS — J9621 Acute and chronic respiratory failure with hypoxia: Secondary | ICD-10-CM | POA: Diagnosis not present

## 2024-01-31 LAB — BASIC METABOLIC PANEL
Anion gap: 10 (ref 5–15)
BUN: 52 mg/dL — ABNORMAL HIGH (ref 8–23)
CO2: 28 mmol/L (ref 22–32)
Calcium: 8.4 mg/dL — ABNORMAL LOW (ref 8.9–10.3)
Chloride: 99 mmol/L (ref 98–111)
Creatinine, Ser: 1.95 mg/dL — ABNORMAL HIGH (ref 0.61–1.24)
GFR, Estimated: 34 mL/min — ABNORMAL LOW (ref >60)
Glucose, Bld: 142 mg/dL — ABNORMAL HIGH (ref 70–99)
Potassium: 4.6 mmol/L (ref 3.5–5.1)
Sodium: 137 mmol/L (ref 135–145)

## 2024-01-31 LAB — CBC
HCT: 34.6 % — ABNORMAL LOW (ref 39.0–52.0)
Hemoglobin: 10.7 g/dL — ABNORMAL LOW (ref 13.0–17.0)
MCH: 30.2 pg (ref 26.0–34.0)
MCHC: 30.9 g/dL (ref 30.0–36.0)
MCV: 97.7 fL (ref 80.0–100.0)
Platelets: 168 10*3/uL (ref 150–400)
RBC: 3.54 MIL/uL — ABNORMAL LOW (ref 4.22–5.81)
RDW: 13.6 % (ref 11.5–15.5)
WBC: 10.8 10*3/uL — ABNORMAL HIGH (ref 4.0–10.5)
nRBC: 0 % (ref 0.0–0.2)

## 2024-01-31 LAB — MAGNESIUM: Magnesium: 2.4 mg/dL (ref 1.7–2.4)

## 2024-01-31 MED ORDER — APIXABAN 2.5 MG PO TABS: 2.5000 mg | ORAL_TABLET | Freq: Two times a day (BID) | ORAL | 0 refills | Status: AC

## 2024-01-31 MED ORDER — METHYLPREDNISOLONE SODIUM SUCC 125 MG IJ SOLR
40.0000 mg | Freq: Two times a day (BID) | INTRAMUSCULAR | Status: DC
  Administered 2024-01-31: 40 mg via INTRAVENOUS
  Filled 2024-01-31: qty 2

## 2024-01-31 MED ORDER — OSELTAMIVIR PHOSPHATE 30 MG PO CAPS: 30.0000 mg | ORAL_CAPSULE | Freq: Two times a day (BID) | ORAL | 0 refills | Status: DC

## 2024-01-31 MED ORDER — ALPRAZOLAM 0.25 MG PO TABS: 0.2500 mg | ORAL_TABLET | Freq: Three times a day (TID) | ORAL | 0 refills | Status: AC | PRN

## 2024-01-31 MED ORDER — OSELTAMIVIR PHOSPHATE 30 MG PO CAPS: 30.0000 mg | ORAL_CAPSULE | Freq: Two times a day (BID) | ORAL | 0 refills | Status: AC

## 2024-01-31 MED ORDER — SODIUM CHLORIDE 0.9 % IV BOLUS
1000.0000 mL | Freq: Once | INTRAVENOUS | Status: AC
  Administered 2024-01-31: 1000 mL via INTRAVENOUS

## 2024-01-31 NOTE — Discharge Summary (Signed)
 Physician Discharge Summary  Kyle Owen FMW:[id number] DOB: 1941-07-29 DOA: 01/29/2024  PCP: Sheryle Carwin, MD  Admit date: 01/29/2024  Discharge date: 01/31/2024  Admitted From:Home  Disposition:  Home   Recommendations for Outpatient Follow-up:  Follow up with home hospice agency who will come by to evaluate him Continue home prednisone  Continue Tamiflu  for 2 more days to complete course of treatment Continue other home medications as prior  Home Health: None  Equipment/Devices:Has home nasal cannula oxygen 4 L  Discharge Condition:Stable  CODE STATUS: Full  Diet recommendation: Heart Healthy  Brief/Interim Summary: Kyle Owen is a 83 year old male with extensive history of advanced dementia, chronic A-fib on Eliquis , BPH, prostate cancer, COPD oxygen dependent 4 L, depression, TIAs, HLD, HTN, OA, DJD, old pulmonary nodule, chronic thoracic aneurysm. Presented with progressive shortness of breath, hypoxia. Patient was admitted with acute on chronic hypoxemic respiratory failure with hypercarbia secondary to COPD exacerbation in the setting of influenza A infection.  He is now back to his baseline oxygen requirements of about 3-4 L nasal cannula.  He appears to have failure to thrive and is not eating or drinking very much.  Family members are interested and pursuing home hospice services and this appears to be the best course of action.  He will continue to remain on Tamiflu  as prescribed to complete course of treatment and is otherwise stable for discharge today.  No other acute events or concerns noted.  Discharge Diagnoses:  Principal Problem:   Acute on chronic respiratory failure with hypoxia (HCC) Active Problems:   Paroxysmal atrial fibrillation (HCC)   COPD with chronic bronchitis and emphysema (HCC)   Pulmonary nodule, left   Thoracic ascending aortic aneurysm (HCC)   Advanced dementia (HCC)   Lobar pneumonia (HCC)   Chronic kidney disease, stage 3b (HCC)    Anxiety   History of stroke   Bladder cancer (HCC)   Hyperlipidemia   Atrial fibrillation with rapid ventricular response (HCC)   Acute on chronic respiratory failure (HCC)  Principal discharge diagnosis: Acute on chronic hypoxemic/hypercarbic respiratory failure secondary to COPD exacerbation in the setting of influenza A infection.  Failure to thrive.  Discharge Instructions  Discharge Instructions     Diet - low sodium heart healthy   Complete by: As directed    Increase activity slowly   Complete by: As directed       Allergies as of 01/31/2024       Reactions   Flomax [tamsulosin Hcl] Nausea And Vomiting, Other (See Comments)   Pt thought he was dying  DIZZY   Fentanyl  Other (See Comments)   Makes me combative  Pt states he's had it since then and was fine 10-19-23        Medication List     STOP taking these medications    doxycycline  100 MG tablet Commonly known as: VIBRA -TABS       TAKE these medications    acetaminophen  500 MG tablet Commonly known as: TYLENOL  Take 500 mg by mouth in the morning and at bedtime.   amiodarone  200 MG tablet Commonly known as: PACERONE  Take 200 mg by mouth 2 (two) times daily.   apixaban  2.5 MG Tabs tablet Commonly known as: ELIQUIS  Take 1 tablet (2.5 mg total) by mouth 2 (two) times daily. What changed:  medication strength how much to take   atorvastatin  20 MG tablet Commonly known as: LIPITOR TAKE 1 TABLET BY MOUTH DAILY. What changed: how much to take   budesonide -formoterol  160-4.5  MCG/ACT inhaler Commonly known as: SYMBICORT  INHALE 2 PUFFS INTO THE LUNGS TWICE DAILY.   CENTRUM ADULT PO Take 1 tablet by mouth daily.   cetirizine 10 MG tablet Commonly known as: ZYRTEC Take 10 mg by mouth daily.   feeding supplement Liqd Take 237 mLs by mouth 2 (two) times daily between meals.   finasteride  5 MG tablet Commonly known as: PROSCAR  Take 5 mg by mouth in the morning.   furosemide  20 MG  tablet Commonly known as: LASIX  Take 20 mg by mouth daily as needed.   ipratropium-albuterol  0.5-2.5 (3) MG/3ML Soln Commonly known as: DUONEB Take 3 mLs by nebulization every 6 (six) hours as needed (as needed for shortness of breath or wheezing).   ipratropium-albuterol  0.5-2.5 (3) MG/3ML Soln Commonly known as: DUONEB Inhale into the lungs.   Magnesium  250 MG Tabs Take 250 mg by mouth in the morning and at bedtime.   melatonin 3 MG Tabs tablet Take 2 tablets (6 mg total) by mouth at bedtime.   oseltamivir  30 MG capsule Commonly known as: TAMIFLU  Take 1 capsule (30 mg total) by mouth 2 (two) times daily for 2 days.   predniSONE  20 MG tablet Commonly known as: DELTASONE  Take 20 mg by mouth 2 (two) times daily.   risperiDONE  0.25 MG tablet Commonly known as: RISPERDAL  Take 0.25 mg by mouth every 4 (four) hours as needed.   sertraline  50 MG tablet Commonly known as: ZOLOFT  Take 50 mg by mouth every morning.   verapamil  240 MG CR tablet Commonly known as: CALAN -SR Take 240 mg by mouth daily.   Vitamin D  50 MCG (2000 UT) tablet Take 2,000 Units by mouth in the morning.   Vitamin D3 25 MCG (1000 UT) Caps Take by mouth.               Durable Medical Equipment  (From admission, onward)           Start     Ordered   01/31/24 0859  For home use only DME Bedside commode  Once       Question:  Patient needs a bedside commode to treat with the following condition  Answer:  Physical deconditioning   01/31/24 0858   01/31/24 0858  For home use only DME Hospital bed  Once       Question Answer Comment  Length of Need Lifetime   The above medical condition requires: Patient requires the ability to reposition frequently   Head must be elevated greater than: 30 degrees   Bed type Semi-electric      01/31/24 0857            Follow-up Information     Sheryle Carwin, MD. Schedule an appointment as soon as possible for a visit in 1 week(s).   Specialty: Internal  Medicine Contact information: [address] [place] [address] [phone]                Allergies  Allergen Reactions   Flomax [Tamsulosin Hcl] Nausea And Vomiting and Other (See Comments)    Pt thought he was dying  DIZZY   Fentanyl  Other (See Comments)    Makes me combative  Pt states he's had it since then and was fine 10-19-23    Consultations: None   Procedures/Studies: CT Angio Chest PE W and/or Wo Contrast Result Date: 01/29/2024 CLINICAL DATA:  Hypoxia and weakness EXAM: CT ANGIOGRAPHY CHEST WITH CONTRAST TECHNIQUE: Multidetector CT imaging of the chest was performed using the  standard protocol during bolus administration of intravenous contrast. Multiplanar CT image reconstructions and MIPs were obtained to evaluate the vascular anatomy. RADIATION DOSE REDUCTION: This exam was performed according to the departmental dose-optimization program which includes automated exposure control, adjustment of the mA and/or kV according to patient size and/or use of iterative reconstruction technique. CONTRAST:  60mL OMNIPAQUE  IOHEXOL  350 MG/ML SOLN COMPARISON:  Same day chest radiograph, CTA chest dated 12/29/2023, CT chest dated 01/24/2023 FINDINGS: Cardiovascular: The study is high quality for the evaluation of pulmonary embolism. There are no filling defects in the central, lobar, segmental or subsegmental pulmonary artery branches to suggest acute pulmonary embolism. Ascending thoracic aorta measures 4.9 x 4.8 cm, unchanged from 01/24/2023. normal heart size. No significant pericardial fluid/thickening. Coronary artery calcifications and aortic atherosclerosis. Mediastinum/Nodes: Imaged thyroid  gland without nodules meeting criteria for imaging follow-up by size. Normal esophagus. No pathologically enlarged axillary, supraclavicular, mediastinal, or hilar lymph nodes. Lungs/Pleura: The central airways are patent. Severe peribronchial wall thickening of the bilateral  lower lobes. Interval increased irregular consolidation in the posterior right upper lobe, lingula, and bilateral lower lobes. Decreased but persistent scattered ground-glass and tree-in-bud nodules within all lobes. No pneumothorax. No pleural effusion. Upper abdomen: Calcified splenic granulomata. Subcentimeter hepatic segment 7 hypodensities, too small to characterize. Musculoskeletal: No acute or abnormal lytic or blastic osseous lesions. Multilevel degenerative changes of the thoracic spine. Review of the MIP images confirms the above findings. IMPRESSION: 1. No evidence of pulmonary embolism. 2. Interval increased irregular consolidation in the posterior right upper lobe, lingula, and bilateral lower lobes, likely worsening multifocal pneumonia. Decreased but persistent scattered ground-glass and tree-in-bud nodules within all lobes, likely infectious/inflammatory. 3. Unchanged ascending thoracic aortic aneurysm measuring 4.9 cm. Ascending thoracic aortic aneurysm. Recommend semi-annual imaging followup by CTA or MRA and referral to cardiothoracic surgery if not already obtained. This recommendation follows 2010 ACCF/AHA/AATS/ACR/ASA/SCA/SCAI/SIR/STS/SVM Guidelines for the Diagnosis and Management of Patients With Thoracic Aortic Disease. Circulation. 2010; 121: Z733-z630. Aortic aneurysm NOS (ICD10-I71.9) 4. Aortic Atherosclerosis (ICD10-I70.0). Coronary artery calcifications. Assessment for potential risk factor modification, dietary therapy or pharmacologic therapy may be warranted, if clinically indicated. Electronically Signed   By: Limin  Xu M.D.   On: 01/29/2024 13:29   US  Venous Img Lower Unilateral Right Result Date: 01/29/2024 CLINICAL DATA:  Right lower extremity swelling EXAM: RIGHT LOWER EXTREMITY VENOUS DOPPLER ULTRASOUND TECHNIQUE: Gray-scale sonography with compression, as well as color and duplex ultrasound, were performed to evaluate the deep venous system(s) from the level of the common  femoral vein through the popliteal and proximal calf veins. COMPARISON:  None Available. FINDINGS: VENOUS Normal compressibility of the common femoral, superficial femoral, and popliteal veins, as well as the visualized calf veins. Visualized portions of profunda femoral vein and great saphenous vein unremarkable. No filling defects to suggest DVT on grayscale or color Doppler imaging. Doppler waveforms show normal direction of venous flow, normal respiratory plasticity and response to augmentation. Limited views of the contralateral common femoral vein are unremarkable. OTHER None. Limitations: Calf veins not well visualized. IMPRESSION: No evidence of right lower extremity DVT. Electronically Signed   By: Franky Crease M.D.   On: 01/29/2024 12:41   DG Chest Port 1 View Result Date: 01/29/2024 CLINICAL DATA:  Shortness of breath EXAM: PORTABLE CHEST 1 VIEW COMPARISON:  01/03/2024 FINDINGS: Worsening airspace opacity in the right mid lung, suspicious for active pneumonia. Continued bandlike opacities at the left lung worsened right mid lung airspace opacity. Bandlike left basilar airspace opacities compatible with atelectasis  and/or pneumonia. Interstitial accentuation and mild hazy opacity at the right lung base, bronchopneumonia not excluded. Atherosclerotic calcification of the aortic arch. Thoracic aortic aneurysm. Mild blunting of the left costophrenic angle. IMPRESSION: 1. Worsening airspace opacity in the right mid lung, suspicious for active pneumonia. 2. Bandlike opacities at the left lung base compatible with atelectasis and/or pneumonia. 3. Interstitial accentuation and mild hazy opacity at the right lung base, bronchopneumonia not excluded. 4. Mild blunting of the left costophrenic angle, possibly a small effusion. 5. Thoracic aortic aneurysm. Electronically Signed   By: Ryan Salvage M.D.   On: 01/29/2024 11:47   ECHOCARDIOGRAM COMPLETE Result Date: 01/03/2024    ECHOCARDIOGRAM REPORT   Patient  Name:   ARYE WEYENBERG Date of Exam: 01/03/2024 Medical Rec #:  [id number]       Height:       72.0 in Accession #:    [id number]      Weight:       197.5 lb Date of Birth:  Dec 02, 1941        BSA:          2.119 m Patient Age:    82 years        BP:           133/65 mmHg Patient Gender: M               HR:           69 bpm. Exam Location:  [facility] Procedure: 2D Echo, Cardiac Doppler and Color Doppler Indications:    Atrial Fibrillation I48.91  History:        Patient has prior history of Echocardiogram examinations, most                 recent 07/27/2015. COPD and Stroke, Arrythmias:Atrial                 Fibrillation; Risk Factors:Dyslipidemia. Advanced dementia                 (HCC).  Sonographer:    Aida Pizza RCS Referring Phys: [id number] DORN FALCON BRANCH IMPRESSIONS  1. Left ventricular ejection fraction, by estimation, is 65 to 70%. The left ventricle has normal function. The left ventricle has no regional wall motion abnormalities. There is mild left ventricular hypertrophy. Left ventricular diastolic parameters are indeterminate.  2. Right ventricular systolic function is normal. The right ventricular size is normal. There is normal pulmonary artery systolic pressure.  3. Left atrial size was mildly dilated.  4. The mitral valve is normal in structure. No evidence of mitral valve regurgitation. No evidence of mitral stenosis.  5. The tricuspid valve is abnormal.  6. The aortic valve is tricuspid. There is moderate calcification of the aortic valve. There is moderate thickening of the aortic valve. Aortic valve regurgitation is not visualized. No aortic stenosis is present.  7. The inferior vena cava is normal in size with greater than 50% respiratory variability, suggesting right atrial pressure of 3 mmHg. FINDINGS  Left Ventricle: Left ventricular ejection fraction, by estimation, is 65 to 70%. The left ventricle has normal function. The left ventricle has no regional wall motion abnormalities. The left  ventricular internal cavity size was normal in size. There is  mild left ventricular hypertrophy. Left ventricular diastolic parameters are indeterminate. Right Ventricle: The right ventricular size is normal. Right vetricular wall thickness was not well visualized. Right ventricular systolic function is normal. There is normal pulmonary artery systolic pressure. The tricuspid regurgitant  velocity is 2.57 m/s, and with an assumed right atrial pressure of 3 mmHg, the estimated right ventricular systolic pressure is 29.4 mmHg. Left Atrium: Left atrial size was mildly dilated. Right Atrium: Right atrial size was normal in size. Pericardium: There is no evidence of pericardial effusion. Mitral Valve: The mitral valve is normal in structure. No evidence of mitral valve regurgitation. No evidence of mitral valve stenosis. Tricuspid Valve: The tricuspid valve is abnormal. Tricuspid valve regurgitation is mild . No evidence of tricuspid stenosis. Aortic Valve: The aortic valve is tricuspid. There is moderate calcification of the aortic valve. There is moderate thickening of the aortic valve. There is moderate aortic valve annular calcification. Aortic valve regurgitation is not visualized. No aortic stenosis is present. Aortic valve mean gradient measures 8.0 mmHg. Aortic valve peak gradient measures 15.5 mmHg. Aortic valve area, by VTI measures 1.99 cm. Pulmonic Valve: The pulmonic valve was not well visualized. Pulmonic valve regurgitation is not visualized. No evidence of pulmonic stenosis. Aorta: The aortic root is normal in size and structure. Venous: The inferior vena cava is normal in size with greater than 50% respiratory variability, suggesting right atrial pressure of 3 mmHg. IAS/Shunts: No atrial level shunt detected by color flow Doppler.  LEFT VENTRICLE PLAX 2D LVIDd:         4.60 cm   Diastology LVIDs:         2.10 cm   LV e' medial:    7.40 cm/s LV PW:         1.10 cm   LV E/e' medial:  9.6 LV IVS:         1.10 cm   LV e' lateral:   8.05 cm/s LVOT diam:     1.80 cm   LV E/e' lateral: 8.8 LV SV:         79 LV SV Index:   37 LVOT Area:     2.54 cm  RIGHT VENTRICLE RV S prime:     20.80 cm/s TAPSE (M-mode): 2.6 cm LEFT ATRIUM             Index        RIGHT ATRIUM           Index LA diam:        2.80 cm 1.32 cm/m   RA Area:     25.20 cm LA Vol (A2C):   77.7 ml 36.66 ml/m  RA Volume:   76.70 ml  36.19 ml/m LA Vol (A4C):   84.0 ml 39.63 ml/m LA Biplane Vol: 88.6 ml 41.81 ml/m  AORTIC VALVE AV Area (Vmax):    2.08 cm AV Area (Vmean):   2.06 cm AV Area (VTI):     1.99 cm AV Vmax:           197.00 cm/s AV Vmean:          126.000 cm/s AV VTI:            0.397 m AV Peak Grad:      15.5 mmHg AV Mean Grad:      8.0 mmHg LVOT Vmax:         161.00 cm/s LVOT Vmean:        102.000 cm/s LVOT VTI:          0.310 m LVOT/AV VTI ratio: 0.78  AORTA Ao Root diam: 2.90 cm MITRAL VALVE               TRICUSPID VALVE MV Area (PHT): 2.83 cm  TR Peak grad:   26.4 mmHg MV Decel Time: 268 msec    TR Vmax:        257.00 cm/s MV E velocity: 70.70 cm/s MV A velocity: 65.20 cm/s  SHUNTS MV E/A ratio:  1.08        Systemic VTI:  0.31 m                            Systemic Diam: 1.80 cm Dorn Ross MD Electronically signed by Dorn Ross MD Signature Date/Time: 01/03/2024/12:08:52 PM    Final    DG Chest Port 1 View Result Date: 01/03/2024 CLINICAL DATA:  Dyspnea. EXAM: PORTABLE CHEST 1 VIEW COMPARISON:  December 29, 2023. FINDINGS: Stable cardiomediastinal silhouette. Mildly improved bibasilar interstitial densities are noted suggesting improving edema or atypical inflammation. Small left pleural effusion may be present. Bony thorax is unremarkable. IMPRESSION: Improved bibasilar opacities as noted above. Electronically Signed   By: Lynwood Landy Raddle M.D.   On: 01/03/2024 10:38     Discharge Exam: Vitals:   01/31/24 0452 01/31/24 0900  BP: 106/66 (!) 108/59  Pulse: 60   Resp: 20   Temp: 99 F (37.2 C)   SpO2: 95%    Vitals:    01/30/24 2044 01/31/24 0452 01/31/24 0500 01/31/24 0900  BP: 97/84 106/66  (!) 108/59  Pulse: 69 60    Resp: 20 20    Temp: 98.1 F (36.7 C) 99 F (37.2 C)    TempSrc: Oral     SpO2: 91% 95%    Weight:   79.9 kg   Height:        General: Pt is alert, awake, not in acute distress Cardiovascular: RRR, S1/S2 +, no rubs, no gallops Respiratory: CTA bilaterally, no wheezing, no rhonchi, nasal cannula oxygen Abdominal: Soft, NT, ND, bowel sounds + Extremities: no edema, no cyanosis    The results of significant diagnostics from this hospitalization (including imaging, microbiology, ancillary and laboratory) are listed below for reference.     Microbiology: Recent Results (from the past 240 hours)  Resp panel by RT-PCR (RSV, Flu A&B, Covid) Anterior Nasal Swab     Status: Abnormal   Collection Time: 01/29/24 11:05 AM   Specimen: Anterior Nasal Swab  Result Value Ref Range Status   SARS Coronavirus 2 by RT PCR NEGATIVE NEGATIVE Final    Comment: (NOTE) SARS-CoV-2 target nucleic acids are NOT DETECTED.  The SARS-CoV-2 RNA is generally detectable in upper respiratory specimens during the acute phase of infection. The lowest concentration of SARS-CoV-2 viral copies this assay can detect is 138 copies/mL. A negative result does not preclude SARS-Cov-2 infection and should not be used as the sole basis for treatment or other patient management decisions. A negative result may occur with  improper specimen collection/handling, submission of specimen other than nasopharyngeal swab, presence of viral mutation(s) within the areas targeted by this assay, and inadequate number of viral copies(<138 copies/mL). A negative result must be combined with clinical observations, patient history, and epidemiological information. The expected result is Negative.  Fact Sheet for Patients:  bloggercourse.com  Fact Sheet for Healthcare Providers:   seriousbroker.it  This test is no t yet approved or cleared by the [place]  FDA and  has been authorized for detection and/or diagnosis of SARS-CoV-2 by FDA under an Emergency Use Authorization (EUA). This EUA will remain  in effect (meaning this test can be used) for the duration of the COVID-19 declaration  under Section 564(b)(1) of the Act, 21 U.S.C.section 360bbb-3(b)(1), unless the authorization is terminated  or revoked sooner.       Influenza A by PCR POSITIVE (A) NEGATIVE Final   Influenza B by PCR NEGATIVE NEGATIVE Final    Comment: (NOTE) The Xpert Xpress SARS-CoV-2/FLU/RSV plus assay is intended as an aid in the diagnosis of influenza from Nasopharyngeal swab specimens and should not be used as a sole basis for treatment. Nasal washings and aspirates are unacceptable for Xpert Xpress SARS-CoV-2/FLU/RSV testing.  Fact Sheet for Patients: bloggercourse.com  Fact Sheet for Healthcare Providers: seriousbroker.it  This test is not yet approved or cleared by the [place]  FDA and has been authorized for detection and/or diagnosis of SARS-CoV-2 by FDA under an Emergency Use Authorization (EUA). This EUA will remain in effect (meaning this test can be used) for the duration of the COVID-19 declaration under Section 564(b)(1) of the Act, 21 U.S.C. section 360bbb-3(b)(1), unless the authorization is terminated or revoked.     Resp Syncytial Virus by PCR NEGATIVE NEGATIVE Final    Comment: (NOTE) Fact Sheet for Patients: bloggercourse.com  Fact Sheet for Healthcare Providers: seriousbroker.it  This test is not yet approved or cleared by the [place]  FDA and has been authorized for detection and/or diagnosis of SARS-CoV-2 by FDA under an Emergency Use Authorization (EUA). This EUA will remain in effect (meaning this test can be used)  for the duration of the COVID-19 declaration under Section 564(b)(1) of the Act, 21 U.S.C. section 360bbb-3(b)(1), unless the authorization is terminated or revoked.  Performed at [facility], [address], [place], [place] [address]   Blood culture (routine x 2)     Status: None (Preliminary result)   Collection Time: 01/29/24 12:36 PM   Specimen: BLOOD  Result Value Ref Range Status   Specimen Description BLOOD BLOOD RIGHT ARM AC  Final   Special Requests   Final    Blood Culture adequate volume BOTTLES DRAWN AEROBIC AND ANAEROBIC   Culture   Final    NO GROWTH 2 DAYS Performed at [facility], [address], [place], [place] [address]    Report Status PENDING  Incomplete  Blood culture (routine x 2)     Status: None (Preliminary result)   Collection Time: 01/29/24 12:36 PM   Specimen: BLOOD  Result Value Ref Range Status   Specimen Description BLOOD BLOOD RIGHT ARM FOREARM  Final   Special Requests   Final    Blood Culture adequate volume BOTTLES DRAWN AEROBIC ONLY   Culture   Final    NO GROWTH 2 DAYS Performed at [facility], [address] [place], [place] [address]    Report Status PENDING  Incomplete     Labs: BNP (last 3 results) Recent Labs    01/01/24 0507 01/06/24 0455 01/29/24 1111  BNP 635.0* 102.0* 116.0*   Basic Metabolic Panel: Recent Labs  Lab 01/29/24 1111 01/30/24 0313 01/31/24 0355  NA 137 138 137  K 3.9 4.3 4.6  CL 97* 100 99  CO2 28 27 28   GLUCOSE 128* 171* 142*  BUN 27* 31* 52*  CREATININE 1.64* 1.67* 1.95*  CALCIUM  8.5* 8.6* 8.4*  MG 2.0  --  2.4  PHOS 3.1  --   --    Liver Function Tests: Recent Labs  Lab 01/29/24 1111  AST 26  ALT 13  ALKPHOS 48  BILITOT 0.7  PROT 6.7  ALBUMIN 3.4*   No results for input(s): LIPASE, AMYLASE in the  last 168 hours. No results for input(s): AMMONIA in the last 168 hours. CBC: Recent Labs  Lab 01/29/24 1111 01/30/24 0313 01/31/24 0355  WBC 3.1* 1.9* 10.8*  HGB 12.5* 11.8*  10.7*  HCT 41.1 37.7* 34.6*  MCV 98.3 97.4 97.7  PLT 167 164 168   Cardiac Enzymes: No results for input(s): CKTOTAL, CKMB, CKMBINDEX, TROPONINI in the last 168 hours. BNP: Invalid input(s): POCBNP CBG: Recent Labs  Lab 01/30/24 0815  GLUCAP 125*   D-Dimer No results for input(s): DDIMER in the last 72 hours. Hgb A1c No results for input(s): HGBA1C in the last 72 hours. Lipid Profile No results for input(s): CHOL, HDL, LDLCALC, TRIG, CHOLHDL, LDLDIRECT in the last 72 hours. Thyroid  function studies No results for input(s): TSH, T4TOTAL, T3FREE, THYROIDAB in the last 72 hours.  Invalid input(s): FREET3 Anemia work up No results for input(s): VITAMINB12, FOLATE, FERRITIN, TIBC, IRON, RETICCTPCT in the last 72 hours. Urinalysis    Component Value Date/Time   COLORURINE YELLOW 06/13/2016 1120   APPEARANCEUR Clear 12/04/2023 1539   LABSPEC 1.020 06/13/2016 1120   PHURINE 5.5 06/13/2016 1120   GLUCOSEU Negative 12/04/2023 1539   HGBUR NEGATIVE 06/13/2016 1120   BILIRUBINUR Negative 12/04/2023 1539   KETONESUR NEGATIVE 06/13/2016 1120   PROTEINUR Negative 12/04/2023 1539   PROTEINUR NEGATIVE 06/13/2016 1120   UROBILINOGEN negative (A) 05/04/2020 0930   NITRITE Negative 12/04/2023 1539   NITRITE NEGATIVE 06/13/2016 1120   LEUKOCYTESUR Trace (A) 12/04/2023 1539   Sepsis Labs Recent Labs  Lab 01/29/24 1111 01/30/24 0313 01/31/24 0355  WBC 3.1* 1.9* 10.8*   Microbiology Recent Results (from the past 240 hours)  Resp panel by RT-PCR (RSV, Flu A&B, Covid) Anterior Nasal Swab     Status: Abnormal   Collection Time: 01/29/24 11:05 AM   Specimen: Anterior Nasal Swab  Result Value Ref Range Status   SARS Coronavirus 2 by RT PCR NEGATIVE NEGATIVE Final    Comment: (NOTE) SARS-CoV-2 target nucleic acids are NOT DETECTED.  The SARS-CoV-2 RNA is generally detectable in upper respiratory specimens during the acute phase of  infection. The lowest concentration of SARS-CoV-2 viral copies this assay can detect is 138 copies/mL. A negative result does not preclude SARS-Cov-2 infection and should not be used as the sole basis for treatment or other patient management decisions. A negative result may occur with  improper specimen collection/handling, submission of specimen other than nasopharyngeal swab, presence of viral mutation(s) within the areas targeted by this assay, and inadequate number of viral copies(<138 copies/mL). A negative result must be combined with clinical observations, patient history, and epidemiological information. The expected result is Negative.  Fact Sheet for Patients:  bloggercourse.com  Fact Sheet for Healthcare Providers:  seriousbroker.it  This test is no t yet approved or cleared by the [place]  FDA and  has been authorized for detection and/or diagnosis of SARS-CoV-2 by FDA under an Emergency Use Authorization (EUA). This EUA will remain  in effect (meaning this test can be used) for the duration of the COVID-19 declaration under Section 564(b)(1) of the Act, 21 U.S.C.section 360bbb-3(b)(1), unless the authorization is terminated  or revoked sooner.       Influenza A by PCR POSITIVE (A) NEGATIVE Final   Influenza B by PCR NEGATIVE NEGATIVE Final    Comment: (NOTE) The Xpert Xpress SARS-CoV-2/FLU/RSV plus assay is intended as an aid in the diagnosis of influenza from Nasopharyngeal swab specimens and should not be used as a sole basis for treatment. Nasal  washings and aspirates are unacceptable for Xpert Xpress SARS-CoV-2/FLU/RSV testing.  Fact Sheet for Patients: bloggercourse.com  Fact Sheet for Healthcare Providers: seriousbroker.it  This test is not yet approved or cleared by the [place]  FDA and has been authorized for detection and/or diagnosis of  SARS-CoV-2 by FDA under an Emergency Use Authorization (EUA). This EUA will remain in effect (meaning this test can be used) for the duration of the COVID-19 declaration under Section 564(b)(1) of the Act, 21 U.S.C. section 360bbb-3(b)(1), unless the authorization is terminated or revoked.     Resp Syncytial Virus by PCR NEGATIVE NEGATIVE Final    Comment: (NOTE) Fact Sheet for Patients: bloggercourse.com  Fact Sheet for Healthcare Providers: seriousbroker.it  This test is not yet approved or cleared by the [place]  FDA and has been authorized for detection and/or diagnosis of SARS-CoV-2 by FDA under an Emergency Use Authorization (EUA). This EUA will remain in effect (meaning this test can be used) for the duration of the COVID-19 declaration under Section 564(b)(1) of the Act, 21 U.S.C. section 360bbb-3(b)(1), unless the authorization is terminated or revoked.  Performed at [facility], [address], [place], [place] [address]   Blood culture (routine x 2)     Status: None (Preliminary result)   Collection Time: 01/29/24 12:36 PM   Specimen: BLOOD  Result Value Ref Range Status   Specimen Description BLOOD BLOOD RIGHT ARM [facility]  Final   Special Requests   Final    Blood Culture adequate volume BOTTLES DRAWN AEROBIC AND ANAEROBIC   Culture   Final    NO GROWTH 2 DAYS Performed at [facility], [address], [place], [place] [address]    Report Status PENDING  Incomplete  Blood culture (routine x 2)     Status: None (Preliminary result)   Collection Time: 01/29/24 12:36 PM   Specimen: BLOOD  Result Value Ref Range Status   Specimen Description BLOOD BLOOD RIGHT ARM FOREARM  Final   Special Requests   Final    Blood Culture adequate volume BOTTLES DRAWN AEROBIC ONLY   Culture   Final    NO GROWTH 2 DAYS Performed at [facility], [address], [place], [place] [address]    Report Status PENDING  Incomplete      Time coordinating discharge: 35 minutes  SIGNED:   Adron JONETTA Fairly, DO [facility] 01/31/2024, 11:10 AM  If 7PM-7AM, please contact night-coverage [email or web address]

## 2024-01-31 NOTE — Plan of Care (Signed)
  Problem: Education: Goal: Knowledge of General Education information will improve Description: Including pain rating scale, medication(s)/side effects and non-pharmacologic comfort measures Outcome: Progressing   Problem: Health Behavior/Discharge Planning: Goal: Ability to manage health-related needs will improve Outcome: Progressing   Problem: Clinical Measurements: Goal: Ability to maintain clinical measurements within normal limits will improve Outcome: Progressing   Problem: Nutrition: Goal: Adequate nutrition will be maintained Outcome: Progressing   Problem: Coping: Goal: Level of anxiety will decrease Outcome: Progressing   Problem: Elimination: Goal: Will not experience complications related to bowel motility Outcome: Progressing   Problem: Safety: Goal: Ability to remain free from injury will improve Outcome: Progressing   Problem: Skin Integrity: Goal: Risk for impaired skin integrity will decrease Outcome: Progressing

## 2024-01-31 NOTE — Progress Notes (Signed)
 OT Cancellation Note  Patient Details Name: Kyle Owen MRN: [id number] DOB: [birth year]   Cancelled Treatment:    Reason Eval/Treat Not Completed: OT screened, no needs identified, will sign off. Pt's family is choosing hospice care and is no longer in need of an OT evaluation. Pt will be removed from the OT list.   [name] OT, MOT   [name] 01/31/2024, 9:35 AM

## 2024-01-31 NOTE — Progress Notes (Signed)
 Pts family is waiting on hospice bed to be delivered before D/C

## 2024-01-31 NOTE — TOC Transition Note (Signed)
 Transition of Care [facility]) - Discharge Note   Patient Details  Name: Kyle Owen MRN: [id number] Date of Birth: 1941/02/23  [facility] CM/SW Contact:  Sharlyne Stabs, RN Phone Number: 01/31/2024, 10:20 AM   Clinical Narrative:   CM spoke with daughter,Kyle Owen. Dr Sheryle had sent a referral to [facility] hospice the same day the patient came to ED. Family is agreeable and feels he will do better at home. They need a hospital bed and BSC. CM spoke with Lucie at [facility], she has called [employer], they will deliver the DME today. Daughter and Team updated to plan for discharging home later today.    Final next level of care: Home w Hospice Care Barriers to Discharge: Barriers Resolved   Patient Goals and CMS Choice Patient states their goals for this hospitalization and ongoing recovery are:: to return home. CMS Medicare.gov Compare Post Acute Care list provided to:: Patient Represenative (must comment) Choice offered to / list presented to : Adult Children    Discharge Placement         Patient and family notified of of transfer: 01/31/24  Discharge Plan and Services Additional resources added to the After Visit Summary for         [facility] Agency: [facility]      Social Drivers of Health (SDOH) Interventions SDOH Screenings   Food Insecurity: No Food Insecurity (01/30/2024)  Housing: Low Risk  (01/30/2024)  Transportation Needs: No Transportation Needs (01/30/2024)  Utilities: Not At Risk (01/30/2024)  Social Connections: Moderately Integrated (01/30/2024)  Tobacco Use: Medium Risk (01/29/2024)  Health Literacy: High Risk (01/15/2024)   Received from [facility]     Readmission Risk Interventions    01/30/2024    2:02 PM 12/30/2023   12:30 PM  Readmission Risk Prevention Plan  Transportation Screening Complete Complete  Medication Review (RN Care Manager) Complete Complete  PCP or Specialist appointment within 3-5 days of discharge Not Complete    HRI or Home Care Consult Complete Complete  SW Recovery Care/Counseling Consult  Complete  Palliative Care Screening  Not Applicable  Skilled Nursing Facility  Not Applicable

## 2024-01-31 NOTE — Progress Notes (Signed)
 Nsg Discharge Note  Admit Date:  01/29/2024 Discharge date: 01/31/2024   Kyle Owen to be D/C'd Home per MD order.  AVS completed.  Copy for chart, and copy for patient signed, and dated. Patient/caregiver able to verbalize understanding.  Discharge Medication: Allergies as of 01/31/2024       Reactions   Flomax [tamsulosin Hcl] Nausea And Vomiting, Other (See Comments)   Pt thought he was dying  DIZZY   Fentanyl  Other (See Comments)   Makes me combative  Pt states he's had it since then and was fine 10-19-23        Medication List     STOP taking these medications    doxycycline  100 MG tablet Commonly known as: VIBRA -TABS       TAKE these medications    acetaminophen  500 MG tablet Commonly known as: TYLENOL  Take 500 mg by mouth in the morning and at bedtime.   ALPRAZolam  0.25 MG tablet Commonly known as: Xanax  Take 1 tablet (0.25 mg total) by mouth 3 (three) times daily as needed for anxiety.   amiodarone  200 MG tablet Commonly known as: PACERONE  Take 200 mg by mouth 2 (two) times daily.   apixaban  2.5 MG Tabs tablet Commonly known as: ELIQUIS  Take 1 tablet (2.5 mg total) by mouth 2 (two) times daily. What changed:  medication strength how much to take   atorvastatin  20 MG tablet Commonly known as: LIPITOR TAKE 1 TABLET BY MOUTH DAILY. What changed: how much to take   budesonide -formoterol  160-4.5 MCG/ACT inhaler Commonly known as: SYMBICORT  INHALE 2 PUFFS INTO THE LUNGS TWICE DAILY.   CENTRUM ADULT PO Take 1 tablet by mouth daily.   cetirizine 10 MG tablet Commonly known as: ZYRTEC Take 10 mg by mouth daily.   feeding supplement Liqd Take 237 mLs by mouth 2 (two) times daily between meals.   finasteride  5 MG tablet Commonly known as: PROSCAR  Take 5 mg by mouth in the morning.   furosemide  20 MG tablet Commonly known as: LASIX  Take 20 mg by mouth daily as needed.   ipratropium-albuterol  0.5-2.5 (3) MG/3ML Soln Commonly known as:  DUONEB Take 3 mLs by nebulization every 6 (six) hours as needed (as needed for shortness of breath or wheezing).   ipratropium-albuterol  0.5-2.5 (3) MG/3ML Soln Commonly known as: DUONEB Inhale into the lungs.   Magnesium  250 MG Tabs Take 250 mg by mouth in the morning and at bedtime.   melatonin 3 MG Tabs tablet Take 2 tablets (6 mg total) by mouth at bedtime.   oseltamivir  30 MG capsule Commonly known as: TAMIFLU  Take 1 capsule (30 mg total) by mouth 2 (two) times daily for 2 days.   predniSONE  20 MG tablet Commonly known as: DELTASONE  Take 20 mg by mouth 2 (two) times daily.   risperiDONE  0.25 MG tablet Commonly known as: RISPERDAL  Take 0.25 mg by mouth every 4 (four) hours as needed.   sertraline  50 MG tablet Commonly known as: ZOLOFT  Take 50 mg by mouth every morning.   verapamil  240 MG CR tablet Commonly known as: CALAN -SR Take 240 mg by mouth daily.   Vitamin D  50 MCG (2000 UT) tablet Take 2,000 Units by mouth in the morning.   Vitamin D3 25 MCG (1000 UT) Caps Take by mouth.               Durable Medical Equipment  (From admission, onward)           Start     Ordered  01/31/24 0859  For home use only DME Bedside commode  Once       Question:  Patient needs a bedside commode to treat with the following condition  Answer:  Physical deconditioning   01/31/24 0858   01/31/24 0858  For home use only DME Hospital bed  Once       Question Answer Comment  Length of Need Lifetime   The above medical condition requires: Patient requires the ability to reposition frequently   Head must be elevated greater than: 30 degrees   Bed type Semi-electric      01/31/24 0857            Discharge Assessment: Vitals:   01/31/24 0452 01/31/24 0900  BP: 106/66 (!) 108/59  Pulse: 60   Resp: 20   Temp: 99 F (37.2 C)   SpO2: 95%    Skin clean, dry and intact without evidence of skin break down, no evidence of skin tears noted. IV catheter discontinued  intact. Site without signs and symptoms of complications - no redness or edema noted at insertion site, patient denies c/o pain - only slight tenderness at site.  Dressing with slight pressure applied.  D/c Instructions-Education: Discharge instructions given to patient/family with verbalized understanding. D/c education completed with patient/family including follow up instructions, medication list, d/c activities limitations if indicated, with other d/c instructions as indicated by MD - patient able to verbalize understanding, all questions fully answered. Patient instructed to return to ED, call 911, or call MD for any changes in condition.  Patient escorted via WC, and D/C home via private auto.  Laymon JULIANNA Casey, LPN 06/26/7973 7:87 PM

## 2024-01-31 NOTE — Care Management Important Message (Signed)
 Important Message  Patient Details  Name: Kyle Owen MRN: [id number] Date of Birth: [birth year]   Important Message Given:  N/A - LOS <3 / Initial given by admissions     [name] 01/31/2024, 1:48 PM

## 2024-01-31 NOTE — Discharge Summary (Signed)
 Physician Discharge Summary  ALAM GUTERREZ FMW:[id number] DOB: 07/05/1941 DOA: 01/29/2024  PCP: Sheryle Carwin, MD  Admit date: 01/29/2024  Discharge date: 01/31/2024  Admitted From:Home  Disposition:  Home   Recommendations for Outpatient Follow-up:  Follow up with home hospice agency who will come by to evaluate him Continue home prednisone  Continue Tamiflu  for 2 more days to complete course of treatment Continue other home medications as prior  Home Health: None  Equipment/Devices:Has home nasal cannula oxygen 4 L  Discharge Condition:Stable  CODE STATUS: Full  Diet recommendation: Heart Healthy  Brief/Interim Summary: Kyle Owen is a 83 year old male with extensive history of advanced dementia, chronic A-fib on Eliquis , BPH, prostate cancer, COPD oxygen dependent 4 L, depression, TIAs, HLD, HTN, OA, DJD, old pulmonary nodule, chronic thoracic aneurysm. Presented with progressive shortness of breath, hypoxia. Patient was admitted with acute on chronic hypoxemic respiratory failure with hypercarbia secondary to COPD exacerbation in the setting of influenza A infection.  He is now back to his baseline oxygen requirements of about 3-4 L nasal cannula.  He appears to have failure to thrive and is not eating or drinking very much.  Family members are interested and pursuing home hospice services and this appears to be the best course of action.  He will continue to remain on Tamiflu  as prescribed to complete course of treatment and is otherwise stable for discharge today.  No other acute events or concerns noted.  Discharge Diagnoses:  Principal Problem:   Acute on chronic respiratory failure with hypoxia (HCC) Active Problems:   Paroxysmal atrial fibrillation (HCC)   COPD with chronic bronchitis and emphysema (HCC)   Pulmonary nodule, left   Thoracic ascending aortic aneurysm (HCC)   Advanced dementia (HCC)   Lobar pneumonia (HCC)   Chronic kidney disease, stage 3b (HCC)    Anxiety   History of stroke   Bladder cancer (HCC)   Hyperlipidemia   Atrial fibrillation with rapid ventricular response (HCC)   Acute on chronic respiratory failure (HCC)  Principal discharge diagnosis: Acute on chronic hypoxemic/hypercarbic respiratory failure secondary to COPD exacerbation in the setting of influenza A infection.  Failure to thrive.  Discharge Instructions  Discharge Instructions     Diet - low sodium heart healthy   Complete by: As directed    Increase activity slowly   Complete by: As directed       Allergies as of 01/31/2024       Reactions   Flomax [tamsulosin Hcl] Nausea And Vomiting, Other (See Comments)   Pt thought he was dying  DIZZY   Fentanyl  Other (See Comments)   Makes me combative  Pt states he's had it since then and was fine 10-19-23        Medication List     STOP taking these medications    doxycycline  100 MG tablet Commonly known as: VIBRA -TABS       TAKE these medications    acetaminophen  500 MG tablet Commonly known as: TYLENOL  Take 500 mg by mouth in the morning and at bedtime.   ALPRAZolam  0.25 MG tablet Commonly known as: Xanax  Take 1 tablet (0.25 mg total) by mouth 3 (three) times daily as needed for anxiety.   amiodarone  200 MG tablet Commonly known as: PACERONE  Take 200 mg by mouth 2 (two) times daily.   apixaban  2.5 MG Tabs tablet Commonly known as: ELIQUIS  Take 1 tablet (2.5 mg total) by mouth 2 (two) times daily. What changed:  medication strength how much to take  atorvastatin  20 MG tablet Commonly known as: LIPITOR TAKE 1 TABLET BY MOUTH DAILY. What changed: how much to take   budesonide -formoterol  160-4.5 MCG/ACT inhaler Commonly known as: SYMBICORT  INHALE 2 PUFFS INTO THE LUNGS TWICE DAILY.   CENTRUM ADULT PO Take 1 tablet by mouth daily.   cetirizine 10 MG tablet Commonly known as: ZYRTEC Take 10 mg by mouth daily.   feeding supplement Liqd Take 237 mLs by mouth 2 (two) times  daily between meals.   finasteride  5 MG tablet Commonly known as: PROSCAR  Take 5 mg by mouth in the morning.   furosemide  20 MG tablet Commonly known as: LASIX  Take 20 mg by mouth daily as needed.   ipratropium-albuterol  0.5-2.5 (3) MG/3ML Soln Commonly known as: DUONEB Take 3 mLs by nebulization every 6 (six) hours as needed (as needed for shortness of breath or wheezing).   ipratropium-albuterol  0.5-2.5 (3) MG/3ML Soln Commonly known as: DUONEB Inhale into the lungs.   Magnesium  250 MG Tabs Take 250 mg by mouth in the morning and at bedtime.   melatonin 3 MG Tabs tablet Take 2 tablets (6 mg total) by mouth at bedtime.   oseltamivir  30 MG capsule Commonly known as: TAMIFLU  Take 1 capsule (30 mg total) by mouth 2 (two) times daily for 2 days.   predniSONE  20 MG tablet Commonly known as: DELTASONE  Take 20 mg by mouth 2 (two) times daily.   risperiDONE  0.25 MG tablet Commonly known as: RISPERDAL  Take 0.25 mg by mouth every 4 (four) hours as needed.   sertraline  50 MG tablet Commonly known as: ZOLOFT  Take 50 mg by mouth every morning.   verapamil  240 MG CR tablet Commonly known as: CALAN -SR Take 240 mg by mouth daily.   Vitamin D  50 MCG (2000 UT) tablet Take 2,000 Units by mouth in the morning.   Vitamin D3 25 MCG (1000 UT) Caps Take by mouth.               Durable Medical Equipment  (From admission, onward)           Start     Ordered   01/31/24 0859  For home use only DME Bedside commode  Once       Question:  Patient needs a bedside commode to treat with the following condition  Answer:  Physical deconditioning   01/31/24 0858   01/31/24 0858  For home use only DME Hospital bed  Once       Question Answer Comment  Length of Need Lifetime   The above medical condition requires: Patient requires the ability to reposition frequently   Head must be elevated greater than: 30 degrees   Bed type Semi-electric      01/31/24 0857             Follow-up Information     Sheryle Carwin, MD. Schedule an appointment as soon as possible for a visit in 1 week(s).   Specialty: Internal Medicine Contact information: [address] [place] [address] [phone]                Allergies  Allergen Reactions   Flomax [Tamsulosin Hcl] Nausea And Vomiting and Other (See Comments)    Pt thought he was dying  DIZZY   Fentanyl  Other (See Comments)    Makes me combative  Pt states he's had it since then and was fine 10-19-23    Consultations: None   Procedures/Studies: CT Angio Chest PE W and/or Wo Contrast Result Date:  01/29/2024 CLINICAL DATA:  Hypoxia and weakness EXAM: CT ANGIOGRAPHY CHEST WITH CONTRAST TECHNIQUE: Multidetector CT imaging of the chest was performed using the standard protocol during bolus administration of intravenous contrast. Multiplanar CT image reconstructions and MIPs were obtained to evaluate the vascular anatomy. RADIATION DOSE REDUCTION: This exam was performed according to the departmental dose-optimization program which includes automated exposure control, adjustment of the mA and/or kV according to patient size and/or use of iterative reconstruction technique. CONTRAST:  60mL OMNIPAQUE  IOHEXOL  350 MG/ML SOLN COMPARISON:  Same day chest radiograph, CTA chest dated 12/29/2023, CT chest dated 01/24/2023 FINDINGS: Cardiovascular: The study is high quality for the evaluation of pulmonary embolism. There are no filling defects in the central, lobar, segmental or subsegmental pulmonary artery branches to suggest acute pulmonary embolism. Ascending thoracic aorta measures 4.9 x 4.8 cm, unchanged from 01/24/2023. normal heart size. No significant pericardial fluid/thickening. Coronary artery calcifications and aortic atherosclerosis. Mediastinum/Nodes: Imaged thyroid  gland without nodules meeting criteria for imaging follow-up by size. Normal esophagus. No pathologically enlarged axillary, supraclavicular,  mediastinal, or hilar lymph nodes. Lungs/Pleura: The central airways are patent. Severe peribronchial wall thickening of the bilateral lower lobes. Interval increased irregular consolidation in the posterior right upper lobe, lingula, and bilateral lower lobes. Decreased but persistent scattered ground-glass and tree-in-bud nodules within all lobes. No pneumothorax. No pleural effusion. Upper abdomen: Calcified splenic granulomata. Subcentimeter hepatic segment 7 hypodensities, too small to characterize. Musculoskeletal: No acute or abnormal lytic or blastic osseous lesions. Multilevel degenerative changes of the thoracic spine. Review of the MIP images confirms the above findings. IMPRESSION: 1. No evidence of pulmonary embolism. 2. Interval increased irregular consolidation in the posterior right upper lobe, lingula, and bilateral lower lobes, likely worsening multifocal pneumonia. Decreased but persistent scattered ground-glass and tree-in-bud nodules within all lobes, likely infectious/inflammatory. 3. Unchanged ascending thoracic aortic aneurysm measuring 4.9 cm. Ascending thoracic aortic aneurysm. Recommend semi-annual imaging followup by CTA or MRA and referral to cardiothoracic surgery if not already obtained. This recommendation follows 2010 ACCF/AHA/AATS/ACR/ASA/SCA/SCAI/SIR/STS/SVM Guidelines for the Diagnosis and Management of Patients With Thoracic Aortic Disease. Circulation. 2010; 121: Z733-z630. Aortic aneurysm NOS (ICD10-I71.9) 4. Aortic Atherosclerosis (ICD10-I70.0). Coronary artery calcifications. Assessment for potential risk factor modification, dietary therapy or pharmacologic therapy may be warranted, if clinically indicated. Electronically Signed   By: Limin  Xu M.D.   On: 01/29/2024 13:29   US  Venous Img Lower Unilateral Right Result Date: 01/29/2024 CLINICAL DATA:  Right lower extremity swelling EXAM: RIGHT LOWER EXTREMITY VENOUS DOPPLER ULTRASOUND TECHNIQUE: Gray-scale sonography with  compression, as well as color and duplex ultrasound, were performed to evaluate the deep venous system(s) from the level of the common femoral vein through the popliteal and proximal calf veins. COMPARISON:  None Available. FINDINGS: VENOUS Normal compressibility of the common femoral, superficial femoral, and popliteal veins, as well as the visualized calf veins. Visualized portions of profunda femoral vein and great saphenous vein unremarkable. No filling defects to suggest DVT on grayscale or color Doppler imaging. Doppler waveforms show normal direction of venous flow, normal respiratory plasticity and response to augmentation. Limited views of the contralateral common femoral vein are unremarkable. OTHER None. Limitations: Calf veins not well visualized. IMPRESSION: No evidence of right lower extremity DVT. Electronically Signed   By: Franky Crease M.D.   On: 01/29/2024 12:41   DG Chest Port 1 View Result Date: 01/29/2024 CLINICAL DATA:  Shortness of breath EXAM: PORTABLE CHEST 1 VIEW COMPARISON:  01/03/2024 FINDINGS: Worsening airspace opacity in the right mid lung, suspicious  for active pneumonia. Continued bandlike opacities at the left lung worsened right mid lung airspace opacity. Bandlike left basilar airspace opacities compatible with atelectasis and/or pneumonia. Interstitial accentuation and mild hazy opacity at the right lung base, bronchopneumonia not excluded. Atherosclerotic calcification of the aortic arch. Thoracic aortic aneurysm. Mild blunting of the left costophrenic angle. IMPRESSION: 1. Worsening airspace opacity in the right mid lung, suspicious for active pneumonia. 2. Bandlike opacities at the left lung base compatible with atelectasis and/or pneumonia. 3. Interstitial accentuation and mild hazy opacity at the right lung base, bronchopneumonia not excluded. 4. Mild blunting of the left costophrenic angle, possibly a small effusion. 5. Thoracic aortic aneurysm. Electronically Signed   By:  Ryan Salvage M.D.   On: 01/29/2024 11:47   ECHOCARDIOGRAM COMPLETE Result Date: 01/03/2024    ECHOCARDIOGRAM REPORT   Patient Name:   MANA MORISON Date of Exam: 01/03/2024 Medical Rec #:  [id number]       Height:       72.0 in Accession #:    [id number]      Weight:       197.5 lb Date of Birth:  05-Mar-1941        BSA:          2.119 m Patient Age:    82 years        BP:           133/65 mmHg Patient Gender: M               HR:           69 bpm. Exam Location:  [facility] Procedure: 2D Echo, Cardiac Doppler and Color Doppler Indications:    Atrial Fibrillation I48.91  History:        Patient has prior history of Echocardiogram examinations, most                 recent 07/27/2015. COPD and Stroke, Arrythmias:Atrial                 Fibrillation; Risk Factors:Dyslipidemia. Advanced dementia                 (HCC).  Sonographer:    Aida Pizza RCS Referring Phys: [id number] DORN FALCON BRANCH IMPRESSIONS  1. Left ventricular ejection fraction, by estimation, is 65 to 70%. The left ventricle has normal function. The left ventricle has no regional wall motion abnormalities. There is mild left ventricular hypertrophy. Left ventricular diastolic parameters are indeterminate.  2. Right ventricular systolic function is normal. The right ventricular size is normal. There is normal pulmonary artery systolic pressure.  3. Left atrial size was mildly dilated.  4. The mitral valve is normal in structure. No evidence of mitral valve regurgitation. No evidence of mitral stenosis.  5. The tricuspid valve is abnormal.  6. The aortic valve is tricuspid. There is moderate calcification of the aortic valve. There is moderate thickening of the aortic valve. Aortic valve regurgitation is not visualized. No aortic stenosis is present.  7. The inferior vena cava is normal in size with greater than 50% respiratory variability, suggesting right atrial pressure of 3 mmHg. FINDINGS  Left Ventricle: Left ventricular ejection fraction, by  estimation, is 65 to 70%. The left ventricle has normal function. The left ventricle has no regional wall motion abnormalities. The left ventricular internal cavity size was normal in size. There is  mild left ventricular hypertrophy. Left ventricular diastolic parameters are indeterminate. Right Ventricle: The right ventricular size is normal.  Right vetricular wall thickness was not well visualized. Right ventricular systolic function is normal. There is normal pulmonary artery systolic pressure. The tricuspid regurgitant velocity is 2.57 m/s, and with an assumed right atrial pressure of 3 mmHg, the estimated right ventricular systolic pressure is 29.4 mmHg. Left Atrium: Left atrial size was mildly dilated. Right Atrium: Right atrial size was normal in size. Pericardium: There is no evidence of pericardial effusion. Mitral Valve: The mitral valve is normal in structure. No evidence of mitral valve regurgitation. No evidence of mitral valve stenosis. Tricuspid Valve: The tricuspid valve is abnormal. Tricuspid valve regurgitation is mild . No evidence of tricuspid stenosis. Aortic Valve: The aortic valve is tricuspid. There is moderate calcification of the aortic valve. There is moderate thickening of the aortic valve. There is moderate aortic valve annular calcification. Aortic valve regurgitation is not visualized. No aortic stenosis is present. Aortic valve mean gradient measures 8.0 mmHg. Aortic valve peak gradient measures 15.5 mmHg. Aortic valve area, by VTI measures 1.99 cm. Pulmonic Valve: The pulmonic valve was not well visualized. Pulmonic valve regurgitation is not visualized. No evidence of pulmonic stenosis. Aorta: The aortic root is normal in size and structure. Venous: The inferior vena cava is normal in size with greater than 50% respiratory variability, suggesting right atrial pressure of 3 mmHg. IAS/Shunts: No atrial level shunt detected by color flow Doppler.  LEFT VENTRICLE PLAX 2D LVIDd:          4.60 cm   Diastology LVIDs:         2.10 cm   LV e' medial:    7.40 cm/s LV PW:         1.10 cm   LV E/e' medial:  9.6 LV IVS:        1.10 cm   LV e' lateral:   8.05 cm/s LVOT diam:     1.80 cm   LV E/e' lateral: 8.8 LV SV:         79 LV SV Index:   37 LVOT Area:     2.54 cm  RIGHT VENTRICLE RV S prime:     20.80 cm/s TAPSE (M-mode): 2.6 cm LEFT ATRIUM             Index        RIGHT ATRIUM           Index LA diam:        2.80 cm 1.32 cm/m   RA Area:     25.20 cm LA Vol (A2C):   77.7 ml 36.66 ml/m  RA Volume:   76.70 ml  36.19 ml/m LA Vol (A4C):   84.0 ml 39.63 ml/m LA Biplane Vol: 88.6 ml 41.81 ml/m  AORTIC VALVE AV Area (Vmax):    2.08 cm AV Area (Vmean):   2.06 cm AV Area (VTI):     1.99 cm AV Vmax:           197.00 cm/s AV Vmean:          126.000 cm/s AV VTI:            0.397 m AV Peak Grad:      15.5 mmHg AV Mean Grad:      8.0 mmHg LVOT Vmax:         161.00 cm/s LVOT Vmean:        102.000 cm/s LVOT VTI:          0.310 m LVOT/AV VTI ratio: 0.78  AORTA Ao Root diam: 2.90 cm  MITRAL VALVE               TRICUSPID VALVE MV Area (PHT): 2.83 cm    TR Peak grad:   26.4 mmHg MV Decel Time: 268 msec    TR Vmax:        257.00 cm/s MV E velocity: 70.70 cm/s MV A velocity: 65.20 cm/s  SHUNTS MV E/A ratio:  1.08        Systemic VTI:  0.31 m                            Systemic Diam: 1.80 cm Dorn Ross MD Electronically signed by Dorn Ross MD Signature Date/Time: 01/03/2024/12:08:52 PM    Final    DG Chest Port 1 View Result Date: 01/03/2024 CLINICAL DATA:  Dyspnea. EXAM: PORTABLE CHEST 1 VIEW COMPARISON:  December 29, 2023. FINDINGS: Stable cardiomediastinal silhouette. Mildly improved bibasilar interstitial densities are noted suggesting improving edema or atypical inflammation. Small left pleural effusion may be present. Bony thorax is unremarkable. IMPRESSION: Improved bibasilar opacities as noted above. Electronically Signed   By: Lynwood Landy Raddle M.D.   On: 01/03/2024 10:38     Discharge  Exam: Vitals:   01/31/24 0452 01/31/24 0900  BP: 106/66 (!) 108/59  Pulse: 60   Resp: 20   Temp: 99 F (37.2 C)   SpO2: 95%    Vitals:   01/30/24 2044 01/31/24 0452 01/31/24 0500 01/31/24 0900  BP: 97/84 106/66  (!) 108/59  Pulse: 69 60    Resp: 20 20    Temp: 98.1 F (36.7 C) 99 F (37.2 C)    TempSrc: Oral     SpO2: 91% 95%    Weight:   79.9 kg   Height:        General: Pt is alert, awake, not in acute distress Cardiovascular: RRR, S1/S2 +, no rubs, no gallops Respiratory: CTA bilaterally, no wheezing, no rhonchi, nasal cannula oxygen Abdominal: Soft, NT, ND, bowel sounds + Extremities: no edema, no cyanosis    The results of significant diagnostics from this hospitalization (including imaging, microbiology, ancillary and laboratory) are listed below for reference.     Microbiology: Recent Results (from the past 240 hours)  Resp panel by RT-PCR (RSV, Flu A&B, Covid) Anterior Nasal Swab     Status: Abnormal   Collection Time: 01/29/24 11:05 AM   Specimen: Anterior Nasal Swab  Result Value Ref Range Status   SARS Coronavirus 2 by RT PCR NEGATIVE NEGATIVE Final    Comment: (NOTE) SARS-CoV-2 target nucleic acids are NOT DETECTED.  The SARS-CoV-2 RNA is generally detectable in upper respiratory specimens during the acute phase of infection. The lowest concentration of SARS-CoV-2 viral copies this assay can detect is 138 copies/mL. A negative result does not preclude SARS-Cov-2 infection and should not be used as the sole basis for treatment or other patient management decisions. A negative result may occur with  improper specimen collection/handling, submission of specimen other than nasopharyngeal swab, presence of viral mutation(s) within the areas targeted by this assay, and inadequate number of viral copies(<138 copies/mL). A negative result must be combined with clinical observations, patient history, and epidemiological information. The expected result is  Negative.  Fact Sheet for Patients:  bloggercourse.com  Fact Sheet for Healthcare Providers:  seriousbroker.it  This test is no t yet approved or cleared by the [place]  FDA and  has been authorized for detection and/or diagnosis of SARS-CoV-2 by FDA  under an Emergency Use Authorization (EUA). This EUA will remain  in effect (meaning this test can be used) for the duration of the COVID-19 declaration under Section 564(b)(1) of the Act, 21 U.S.C.section 360bbb-3(b)(1), unless the authorization is terminated  or revoked sooner.       Influenza A by PCR POSITIVE (A) NEGATIVE Final   Influenza B by PCR NEGATIVE NEGATIVE Final    Comment: (NOTE) The Xpert Xpress SARS-CoV-2/FLU/RSV plus assay is intended as an aid in the diagnosis of influenza from Nasopharyngeal swab specimens and should not be used as a sole basis for treatment. Nasal washings and aspirates are unacceptable for Xpert Xpress SARS-CoV-2/FLU/RSV testing.  Fact Sheet for Patients: bloggercourse.com  Fact Sheet for Healthcare Providers: seriousbroker.it  This test is not yet approved or cleared by the [place]  FDA and has been authorized for detection and/or diagnosis of SARS-CoV-2 by FDA under an Emergency Use Authorization (EUA). This EUA will remain in effect (meaning this test can be used) for the duration of the COVID-19 declaration under Section 564(b)(1) of the Act, 21 U.S.C. section 360bbb-3(b)(1), unless the authorization is terminated or revoked.     Resp Syncytial Virus by PCR NEGATIVE NEGATIVE Final    Comment: (NOTE) Fact Sheet for Patients: bloggercourse.com  Fact Sheet for Healthcare Providers: seriousbroker.it  This test is not yet approved or cleared by the [place]  FDA and has been authorized for detection and/or diagnosis of  SARS-CoV-2 by FDA under an Emergency Use Authorization (EUA). This EUA will remain in effect (meaning this test can be used) for the duration of the COVID-19 declaration under Section 564(b)(1) of the Act, 21 U.S.C. section 360bbb-3(b)(1), unless the authorization is terminated or revoked.  Performed at [facility], [address] [place], [place] [address]   Blood culture (routine x 2)     Status: None (Preliminary result)   Collection Time: 01/29/24 12:36 PM   Specimen: BLOOD  Result Value Ref Range Status   Specimen Description BLOOD BLOOD RIGHT ARM AC  Final   Special Requests   Final    Blood Culture adequate volume BOTTLES DRAWN AEROBIC AND ANAEROBIC   Culture   Final    NO GROWTH 2 DAYS Performed at [facility], [address], [place], [place] [address]    Report Status PENDING  Incomplete  Blood culture (routine x 2)     Status: None (Preliminary result)   Collection Time: 01/29/24 12:36 PM   Specimen: BLOOD  Result Value Ref Range Status   Specimen Description BLOOD BLOOD RIGHT ARM FOREARM  Final   Special Requests   Final    Blood Culture adequate volume BOTTLES DRAWN AEROBIC ONLY   Culture   Final    NO GROWTH 2 DAYS Performed at [facility], [address], [place], [place] [address]    Report Status PENDING  Incomplete     Labs: BNP (last 3 results) Recent Labs    01/01/24 0507 01/06/24 0455 01/29/24 1111  BNP 635.0* 102.0* 116.0*   Basic Metabolic Panel: Recent Labs  Lab 01/29/24 1111 01/30/24 0313 01/31/24 0355  NA 137 138 137  K 3.9 4.3 4.6  CL 97* 100 99  CO2 28 27 28   GLUCOSE 128* 171* 142*  BUN 27* 31* 52*  CREATININE 1.64* 1.67* 1.95*  CALCIUM  8.5* 8.6* 8.4*  MG 2.0  --  2.4  PHOS 3.1  --   --    Liver Function Tests: Recent Labs  Lab 01/29/24 1111  AST  26  ALT 13  ALKPHOS 48  BILITOT 0.7  PROT 6.7  ALBUMIN 3.4*   No results for input(s): LIPASE, AMYLASE in the last 168 hours. No results for input(s): AMMONIA in the  last 168 hours. CBC: Recent Labs  Lab 01/29/24 1111 01/30/24 0313 01/31/24 0355  WBC 3.1* 1.9* 10.8*  HGB 12.5* 11.8* 10.7*  HCT 41.1 37.7* 34.6*  MCV 98.3 97.4 97.7  PLT 167 164 168   Cardiac Enzymes: No results for input(s): CKTOTAL, CKMB, CKMBINDEX, TROPONINI in the last 168 hours. BNP: Invalid input(s): POCBNP CBG: Recent Labs  Lab 01/30/24 0815  GLUCAP 125*   D-Dimer No results for input(s): DDIMER in the last 72 hours. Hgb A1c No results for input(s): HGBA1C in the last 72 hours. Lipid Profile No results for input(s): CHOL, HDL, LDLCALC, TRIG, CHOLHDL, LDLDIRECT in the last 72 hours. Thyroid  function studies No results for input(s): TSH, T4TOTAL, T3FREE, THYROIDAB in the last 72 hours.  Invalid input(s): FREET3 Anemia work up No results for input(s): VITAMINB12, FOLATE, FERRITIN, TIBC, IRON, RETICCTPCT in the last 72 hours. Urinalysis    Component Value Date/Time   COLORURINE YELLOW 06/13/2016 1120   APPEARANCEUR Clear 12/04/2023 1539   LABSPEC 1.020 06/13/2016 1120   PHURINE 5.5 06/13/2016 1120   GLUCOSEU Negative 12/04/2023 1539   HGBUR NEGATIVE 06/13/2016 1120   BILIRUBINUR Negative 12/04/2023 1539   KETONESUR NEGATIVE 06/13/2016 1120   PROTEINUR Negative 12/04/2023 1539   PROTEINUR NEGATIVE 06/13/2016 1120   UROBILINOGEN negative (A) 05/04/2020 0930   NITRITE Negative 12/04/2023 1539   NITRITE NEGATIVE 06/13/2016 1120   LEUKOCYTESUR Trace (A) 12/04/2023 1539   Sepsis Labs Recent Labs  Lab 01/29/24 1111 01/30/24 0313 01/31/24 0355  WBC 3.1* 1.9* 10.8*   Microbiology Recent Results (from the past 240 hours)  Resp panel by RT-PCR (RSV, Flu A&B, Covid) Anterior Nasal Swab     Status: Abnormal   Collection Time: 01/29/24 11:05 AM   Specimen: Anterior Nasal Swab  Result Value Ref Range Status   SARS Coronavirus 2 by RT PCR NEGATIVE NEGATIVE Final    Comment: (NOTE) SARS-CoV-2 target nucleic acids  are NOT DETECTED.  The SARS-CoV-2 RNA is generally detectable in upper respiratory specimens during the acute phase of infection. The lowest concentration of SARS-CoV-2 viral copies this assay can detect is 138 copies/mL. A negative result does not preclude SARS-Cov-2 infection and should not be used as the sole basis for treatment or other patient management decisions. A negative result may occur with  improper specimen collection/handling, submission of specimen other than nasopharyngeal swab, presence of viral mutation(s) within the areas targeted by this assay, and inadequate number of viral copies(<138 copies/mL). A negative result must be combined with clinical observations, patient history, and epidemiological information. The expected result is Negative.  Fact Sheet for Patients:  bloggercourse.com  Fact Sheet for Healthcare Providers:  seriousbroker.it  This test is no t yet approved or cleared by the [place]  FDA and  has been authorized for detection and/or diagnosis of SARS-CoV-2 by FDA under an Emergency Use Authorization (EUA). This EUA will remain  in effect (meaning this test can be used) for the duration of the COVID-19 declaration under Section 564(b)(1) of the Act, 21 U.S.C.section 360bbb-3(b)(1), unless the authorization is terminated  or revoked sooner.       Influenza A by PCR POSITIVE (A) NEGATIVE Final   Influenza B by PCR NEGATIVE NEGATIVE Final    Comment: (NOTE) The Xpert Xpress SARS-CoV-2/FLU/RSV plus assay  is intended as an aid in the diagnosis of influenza from Nasopharyngeal swab specimens and should not be used as a sole basis for treatment. Nasal washings and aspirates are unacceptable for Xpert Xpress SARS-CoV-2/FLU/RSV testing.  Fact Sheet for Patients: bloggercourse.com  Fact Sheet for Healthcare Providers: seriousbroker.it  This test  is not yet approved or cleared by the [place]  FDA and has been authorized for detection and/or diagnosis of SARS-CoV-2 by FDA under an Emergency Use Authorization (EUA). This EUA will remain in effect (meaning this test can be used) for the duration of the COVID-19 declaration under Section 564(b)(1) of the Act, 21 U.S.C. section 360bbb-3(b)(1), unless the authorization is terminated or revoked.     Resp Syncytial Virus by PCR NEGATIVE NEGATIVE Final    Comment: (NOTE) Fact Sheet for Patients: bloggercourse.com  Fact Sheet for Healthcare Providers: seriousbroker.it  This test is not yet approved or cleared by the [place]  FDA and has been authorized for detection and/or diagnosis of SARS-CoV-2 by FDA under an Emergency Use Authorization (EUA). This EUA will remain in effect (meaning this test can be used) for the duration of the COVID-19 declaration under Section 564(b)(1) of the Act, 21 U.S.C. section 360bbb-3(b)(1), unless the authorization is terminated or revoked.  Performed at [facility], [address], [place], [place] [address]   Blood culture (routine x 2)     Status: None (Preliminary result)   Collection Time: 01/29/24 12:36 PM   Specimen: BLOOD  Result Value Ref Range Status   Specimen Description BLOOD BLOOD RIGHT ARM [facility]  Final   Special Requests   Final    Blood Culture adequate volume BOTTLES DRAWN AEROBIC AND ANAEROBIC   Culture   Final    NO GROWTH 2 DAYS Performed at [facility], [address] [place], [place] [address]    Report Status PENDING  Incomplete  Blood culture (routine x 2)     Status: None (Preliminary result)   Collection Time: 01/29/24 12:36 PM   Specimen: BLOOD  Result Value Ref Range Status   Specimen Description BLOOD BLOOD RIGHT ARM FOREARM  Final   Special Requests   Final    Blood Culture adequate volume BOTTLES DRAWN AEROBIC ONLY   Culture   Final    NO GROWTH 2  DAYS Performed at [facility], [address], [place], [place] [address]    Report Status PENDING  Incomplete     Time coordinating discharge: 35 minutes  SIGNED:   Adron JONETTA Fairly, DO [facility] 01/31/2024, 12:03 PM  If 7PM-7AM, please contact night-coverage [email or web address]

## 2024-01-31 NOTE — Progress Notes (Signed)
 Palliative: Kyle Owen family has elected to take him home with the benefits of [facility].  It seems that he had been connected with [facility] for outpatient palliative services, but his PCP had referred him to [facility] hospice the day of his hospital admission.   Conference with attending, bedside nursing staff, transition of care team related to patient condition, needs, goals of care, disposition.  Plan: Home with the benefits of [facility] care.  No charge  Lorenza Birkenhead, NP Palliative medicine team Team phone [phone]

## 2024-02-01 ENCOUNTER — Ambulatory Visit: Payer: Medicare Other | Admitting: Physician Assistant

## 2024-02-03 LAB — CULTURE, BLOOD (ROUTINE X 2)
Culture: NO GROWTH
Culture: NO GROWTH
Special Requests: ADEQUATE
Special Requests: ADEQUATE

## 2024-03-04 ENCOUNTER — Other Ambulatory Visit: Payer: Medicare Other | Admitting: Urology

## 2024-03-25 DEATH — deceased

## 2024-05-09 ENCOUNTER — Other Ambulatory Visit: Payer: Self-pay | Admitting: Thoracic Surgery (Cardiothoracic Vascular Surgery)

## 2024-05-09 DIAGNOSIS — I7121 Aneurysm of the ascending aorta, without rupture: Secondary | ICD-10-CM

## 2024-06-03 ENCOUNTER — Ambulatory Visit (HOSPITAL_COMMUNITY): Payer: Self-pay

## 2024-06-10 ENCOUNTER — Ambulatory Visit: Admitting: Thoracic Surgery (Cardiothoracic Vascular Surgery)
